# Patient Record
Sex: Female | Born: 1940 | Race: White | Hispanic: No | Smoking: Former smoker
Health system: Southern US, Community
[De-identification: ages and names within clinical notes are randomized; demographics above are authoritative.]

## PROBLEM LIST (undated history)

## (undated) DIAGNOSIS — I2510 Atherosclerotic heart disease of native coronary artery without angina pectoris: Secondary | ICD-10-CM

## (undated) DIAGNOSIS — I428 Other cardiomyopathies: Secondary | ICD-10-CM

## (undated) DIAGNOSIS — E785 Hyperlipidemia, unspecified: Secondary | ICD-10-CM

## (undated) DIAGNOSIS — I509 Heart failure, unspecified: Secondary | ICD-10-CM

## (undated) DIAGNOSIS — I5042 Chronic combined systolic (congestive) and diastolic (congestive) heart failure: Secondary | ICD-10-CM

## (undated) DIAGNOSIS — R0989 Other specified symptoms and signs involving the circulatory and respiratory systems: Secondary | ICD-10-CM

## (undated) DIAGNOSIS — R000 Tachycardia, unspecified: Secondary | ICD-10-CM

## (undated) DIAGNOSIS — C679 Malignant neoplasm of bladder, unspecified: Secondary | ICD-10-CM

## (undated) DIAGNOSIS — K859 Acute pancreatitis without necrosis or infection, unspecified: Secondary | ICD-10-CM

## (undated) DIAGNOSIS — M1120 Other chondrocalcinosis, unspecified site: Secondary | ICD-10-CM

## (undated) DIAGNOSIS — M810 Age-related osteoporosis without current pathological fracture: Secondary | ICD-10-CM

## (undated) DIAGNOSIS — N182 Chronic kidney disease, stage 2 (mild): Secondary | ICD-10-CM

## (undated) DIAGNOSIS — I272 Pulmonary hypertension, unspecified: Secondary | ICD-10-CM

## (undated) DIAGNOSIS — I341 Nonrheumatic mitral (valve) prolapse: Secondary | ICD-10-CM

## (undated) DIAGNOSIS — R011 Cardiac murmur, unspecified: Secondary | ICD-10-CM

## (undated) DIAGNOSIS — I739 Peripheral vascular disease, unspecified: Secondary | ICD-10-CM

## (undated) DIAGNOSIS — Z720 Tobacco use: Secondary | ICD-10-CM

## (undated) HISTORY — DX: Other specified symptoms and signs involving the circulatory and respiratory systems: R09.89

## (undated) HISTORY — DX: Tobacco use: Z72.0

## (undated) HISTORY — DX: Hyperlipidemia, unspecified: E78.5

## (undated) HISTORY — DX: Age-related osteoporosis without current pathological fracture: M81.0

## (undated) HISTORY — DX: Atherosclerotic heart disease of native coronary artery without angina pectoris: I25.10

## (undated) HISTORY — DX: Cardiac murmur, unspecified: R01.1

## (undated) HISTORY — DX: Tachycardia, unspecified: R00.0

## (undated) HISTORY — DX: Chronic combined systolic (congestive) and diastolic (congestive) heart failure: I50.42

## (undated) HISTORY — PX: APPENDECTOMY: SHX54

## (undated) HISTORY — DX: Malignant neoplasm of bladder, unspecified: C67.9

## (undated) HISTORY — DX: Heart failure, unspecified: I50.9

## (undated) HISTORY — DX: Nonrheumatic mitral (valve) prolapse: I34.1

## (undated) HISTORY — PX: OTHER SURGICAL HISTORY: SHX169

## (undated) HISTORY — DX: Other chondrocalcinosis, unspecified site: M11.20

## (undated) HISTORY — DX: Other cardiomyopathies: I42.8

## (undated) HISTORY — DX: Chronic kidney disease, stage 2 (mild): N18.2

## (undated) HISTORY — DX: Pulmonary hypertension, unspecified: I27.20

## (undated) HISTORY — DX: Acute pancreatitis without necrosis or infection, unspecified: K85.90

## (undated) HISTORY — DX: Peripheral vascular disease, unspecified: I73.9

---

## 2000-11-04 ENCOUNTER — Encounter: Payer: Self-pay | Admitting: Emergency Medicine

## 2000-11-04 ENCOUNTER — Emergency Department (HOSPITAL_COMMUNITY): Admission: EM | Admit: 2000-11-04 | Discharge: 2000-11-04 | Payer: Self-pay | Admitting: Emergency Medicine

## 2004-02-05 ENCOUNTER — Emergency Department (HOSPITAL_COMMUNITY): Admission: EM | Admit: 2004-02-05 | Discharge: 2004-02-05 | Payer: Self-pay | Admitting: Emergency Medicine

## 2004-02-06 ENCOUNTER — Emergency Department (HOSPITAL_COMMUNITY): Admission: EM | Admit: 2004-02-06 | Discharge: 2004-02-06 | Payer: Self-pay | Admitting: Emergency Medicine

## 2007-09-19 ENCOUNTER — Emergency Department (HOSPITAL_COMMUNITY): Admission: EM | Admit: 2007-09-19 | Discharge: 2007-09-19 | Payer: Self-pay | Admitting: Emergency Medicine

## 2007-09-26 HISTORY — PX: LAPAROSCOPIC CHOLECYSTECTOMY: SUR755

## 2007-12-02 ENCOUNTER — Ambulatory Visit (HOSPITAL_COMMUNITY): Admission: RE | Admit: 2007-12-02 | Discharge: 2007-12-03 | Payer: Self-pay | Admitting: General Surgery

## 2007-12-02 ENCOUNTER — Encounter (INDEPENDENT_AMBULATORY_CARE_PROVIDER_SITE_OTHER): Payer: Self-pay | Admitting: General Surgery

## 2008-08-24 ENCOUNTER — Ambulatory Visit (HOSPITAL_COMMUNITY): Admission: RE | Admit: 2008-08-24 | Discharge: 2008-08-24 | Payer: Self-pay | Admitting: Family

## 2009-04-21 ENCOUNTER — Ambulatory Visit: Payer: Self-pay | Admitting: Cardiology

## 2009-05-07 ENCOUNTER — Ambulatory Visit: Payer: Self-pay

## 2009-05-07 ENCOUNTER — Encounter: Payer: Self-pay | Admitting: Cardiology

## 2009-05-07 DIAGNOSIS — R0989 Other specified symptoms and signs involving the circulatory and respiratory systems: Secondary | ICD-10-CM | POA: Insufficient documentation

## 2009-05-17 ENCOUNTER — Telehealth: Payer: Self-pay | Admitting: Cardiology

## 2009-05-20 DIAGNOSIS — M79609 Pain in unspecified limb: Secondary | ICD-10-CM | POA: Insufficient documentation

## 2009-06-05 DIAGNOSIS — E1169 Type 2 diabetes mellitus with other specified complication: Secondary | ICD-10-CM | POA: Insufficient documentation

## 2009-06-05 DIAGNOSIS — C679 Malignant neoplasm of bladder, unspecified: Secondary | ICD-10-CM | POA: Insufficient documentation

## 2009-06-05 DIAGNOSIS — I059 Rheumatic mitral valve disease, unspecified: Secondary | ICD-10-CM | POA: Insufficient documentation

## 2009-06-05 DIAGNOSIS — Z8679 Personal history of other diseases of the circulatory system: Secondary | ICD-10-CM | POA: Insufficient documentation

## 2009-06-05 DIAGNOSIS — E119 Type 2 diabetes mellitus without complications: Secondary | ICD-10-CM | POA: Insufficient documentation

## 2009-06-05 DIAGNOSIS — E785 Hyperlipidemia, unspecified: Secondary | ICD-10-CM

## 2009-06-21 ENCOUNTER — Ambulatory Visit: Payer: Self-pay | Admitting: Cardiovascular Disease

## 2009-07-02 DIAGNOSIS — I739 Peripheral vascular disease, unspecified: Secondary | ICD-10-CM | POA: Insufficient documentation

## 2010-06-10 ENCOUNTER — Encounter: Payer: Self-pay | Admitting: Cardiovascular Disease

## 2010-06-13 ENCOUNTER — Ambulatory Visit: Payer: Self-pay

## 2010-06-13 ENCOUNTER — Encounter: Payer: Self-pay | Admitting: Cardiovascular Disease

## 2010-06-23 ENCOUNTER — Ambulatory Visit: Payer: Self-pay | Admitting: Cardiovascular Disease

## 2010-10-25 NOTE — Miscellaneous (Signed)
Summary: Orders Update  Clinical Lists Changes  Orders: Added new Test order of Arterial Duplex Lower Extremity (Arterial Duplex Low) - Signed 

## 2010-10-25 NOTE — Miscellaneous (Signed)
Summary: Orders Update  Clinical Lists Changes  Problems: Added new problem of ABDOMINAL BRUIT (ICD-785.9) Orders: Added new Test order of Abdominal Aorta Duplex (Abd Aorta Duplex) - Signed 

## 2010-10-25 NOTE — Assessment & Plan Note (Signed)
Summary: Hannah French    Visit Type:  1 year follow up Primary Provider:  Vernon Prey, M.D.  CC:  follow up ABI.  History of Present Illness: 70 year-old woman presents for followup evaluation of lower extremity PAD. She has been diagnosed with iliac stenosis based on duplex ultrasound findings. She is limited by bilateral knee pain and carries a diagnosis of pseudogout. She is able to walk on grass and soft surfaces but has knee pain when walking on concrete or hard surfaces. She denies rest pain or ulceration.  ABI's and arterial duplex scan done on 06/07/10 shows moderate right iliac stenosis, severe left iliac stenosis, and normal ABI's of 1.1 bilaterally.  Current Medications (verified): 1)  Vitamin D 1000 Unit  Tabs (Cholecalciferol) .... Take 1 Tablet By Mouth Once A Day 2)  Ibuprofen 200 Mg Tabs (Ibuprofen) .... As Needed 3)  Aspirin 81 Mg Tbec (Aspirin) .... Take One Tablet By Mouth Daily  Allergies: 1)  ! Metformin Hcl 2)  ! * Bextra 3)  ! Vioxx  Past History:  Past medical history reviewed for relevance to current acute and chronic problems.  Past Medical History: Current Problems:  HEART MURMUR, HX OF (ICD-V12.50) MITRAL VALVE PROLAPSE (ICD-424.0) DYSLIPIDEMIA (ICD-272.4) BLADDER CANCER (ICD-188.9) PSEUDOGOUT (ICD-275.49) DIABETES MELLITUS, TYPE II (ICD-250.00) PVD (443.9) ABDOMINAL BRUIT (ICD-785.9) CAROTID BRUIT (ICD-785.9)  Review of Systems       Negative except as per HPI   Vital Signs:  Patient profile:   70 year old female Height:      61 inches Weight:      132.50 pounds BMI:     25.13 Pulse rate:   80 / minute Pulse rhythm:   regular Resp:     18 per minute BP sitting:   138 / 76  (left arm) Cuff size:   large  Vitals Entered By: Vikki Ports (June 23, 2010 10:32 AM)  Serial Vital Signs/Assessments:  Time      Position  BP       Pulse  Resp  Temp     By           R Arm     140/70                         Vikki Ports   Physical  Exam  General:  Pt is well-developed, alert and oriented, no acute distress HEENT: normal Neck: no thyromegaly           JVP normal, carotid upstrokes normal with bilateral bruits Lungs: CTA Chest: equal expansion  CV: Apical impulse nondisplaced, RRR with grade 2/6 systolic murmur at the right upper sternal border Abd: soft, NT, positive BS, no HSM, no bruit Back: no CVA tenderness Ext: no clubbing, cyanosis, or edema        femoral pulses 2+ and equal        pedal pulses 2+ and equal Skin: warm, dry, no rash     Impression & Recommendations:  Problem # 1:  PVD (ICD-443.9) The patient has bilateral common iliac artery stenosis. She is asymptomatic and has normal ABI's. We discussed indications for angio and interention, but as she is asymptomatic I think she should be managed medically. She should continue ASA daily. Lipids are followed by Dr Christell Constant. In the setting of iliac and carotid stenosis, her goal LDL is less than 100 mg/dL. We had a long discussion about the importance of tobacco cessation and the negative impact of  cigarettes on PAD outcomes.  Problem # 2:  CAROTID BRUIT (ICD-785.9) Studies reviewed from last year. 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 Will followup at the time of her lower extremity scan next year.  Patient Instructions: 1)  Your physician recommends that you continue on your current medications as directed. Please refer to the Current Medication list given to you today. 2)  Your physician wants you to follow-up in: 1 YEAR.   You will receive a reminder letter in the mail two months in advance. If you don't receive a letter, please call our office to schedule the follow-up appointment. 3)  Your physician has requested that you have an abdominal aorta-iliac duplex in 1 YEAR. During this test, an ultrasound is used to evaluate the aorta. Allow 30 minutes for this exam. Do not eat after midnight the day before and avoid carbonated beverages. There are no  restrictions or special instructions. 4)  Your physician has requested that you have an ankle brachial index (ABI) in 1 YEAR. During this test an ultrasound and blood pressure cuff are used to evaluate the arteries that supply the arms and legs with blood. Allow thirty minutes for this exam. There are no restrictions or special instructions. 5)  Your physician has requested that you have a carotid duplex in 1 YEAR. This test is an ultrasound of the carotid arteries in your neck. It looks at blood flow through these arteries that supply the brain with blood. Allow one hour for this exam. There are no restrictions or special instructions.

## 2011-02-07 NOTE — Assessment & Plan Note (Signed)
[facility]                            CARDIOLOGY OFFICE NOTE   VADIS, SLABACH                        MRN:          [id number]  DATE:04/21/2009                            DOB:          1940-11-28    PRIMARY CARE PHYSICIAN:  Lindaann Pascal, PA, [facility].   REASON FOR PRESENTATION:  Evaluate the patient with heart murmur.   HISTORY OF PRESENT ILLNESS:  The patient is a pleasant 70 year old who  was followed some years ago in [place] for a heart murmur.  She said she  had an echocardiogram and describes Holter monitors at that time.  She  does not report a catheterization.  There has never been any necessary  treatment for this and she is not sure of a diagnosis.  She is relocated  to this area.  She has not seen a cardiologist in many years.  She has  been referred here for further evaluation.   Of note, the patient is limited by joint pains.  She has pseudogout.  However, with some activities that includes walking, she cannot bring on  chest pressure, neck or arm discomfort.  She does not report any  shortness of breath, PND or orthopnea.  She does not notice any  palpitations, presyncope or syncope.  I reviewed her side records.  Her  hemoglobin A1c was 8.3.  Her LDL was 131, triglycerides 138 with an HDL  of 46.   PAST MEDICAL HISTORY:  Diabetes mellitus x12 years, dyslipidemia,  pseudogout, heart murmur, and bladder cancer.   PAST SURGICAL HISTORY:  Cholecystectomy, hysterectomy, appendectomy.   ALLERGIES/INTOLERANCES:  METFORMIN.   MEDICATIONS:  1. Glimepiride 4 mg q.a.m.  2. Furosemide 20 mg daily.  3. Vitamin D.  4. Omeprazole.  5. Actos 15 mg daily.  6. Ibuprofen.   SOCIAL HISTORY:  The patient is retired.  She is single.  She has 2  children.  She has been smoking 1 pack per day for 30 years.   FAMILY HISTORY:  Noncontributory for early coronary artery disease.  Her  mother died of heart failure at 70.   Her father died at 30 of an unknown  cause.   REVIEW OF SYSTEMS:  As stated in the HPI, positive for reflux.  Negative  for all other systems.   PHYSICAL EXAMINATION:  GENERAL:  The patient is pleasant and in no  distress.  VITAL SIGNS:  Blood pressure 160/90, heart rate 87 and regular, weight  157 pounds, body mass index 28.  HEENT:  Eyes are unremarkable, pupils are equal, round and reactive to  light, fundi not visualized, oral mucosa unremarkable.  NECK:  No jugular venous distention at 45 degrees.  Carotid upstroke  brisk and symmetric, soft left greater than right, systolic carotid  bruit, no thyromegaly.  LYMPHATICS:  No cervical, axillary, or inguinal adenopathy.  LUNGS:  Decreased breath sounds with a scattered expiratory wheezes, but  no crackles, no dullness to percussion.  BACK:  No costovertebral angle tenderness.  CHEST:  Unremarkable.  HEART:  PMI not displaced or sustained, S1 and S2  within normal limits,  no S3, no S4, 2/6 systolic murmur heard best at the left upper sternal  border, no diastolic murmurs.  ABDOMEN:  Flat, positive bowel sounds.  Normal in frequency and pitch,  positive midline bruit, no rebound, no guarding, no midline pulsatile  mass, no hepatomegaly, no splenomegaly.  SKIN:  No rashes, no nodules.  EXTREMITIES:  Pulses are 2+ throughout, no edema, no cyanosis, no  clubbing.  NEURO:  Oriented to person, place and time, cranial nerves II-XII  grossly intact, motor grossly intact.   EKG; sinus rhythm, rate 87, axis within normal limits, intervals within  normal limits, poor anterior R-wave progression, could not exclude old  anteroseptal infarct, nonspecific T-wave flattening.   ASSESSMENT AND PLAN:  1. Heart murmur.  The patient does have a heart murmur, which I      suspect is mild aortic stenosis.  I will get an echocardiogram to      further evaluate this.  This will also allow me to look for wall      motion abnormalities given the  abnormal EKG.  Further evaluation      will be based on these results.  2. Carotid bruit.  The patient does have this and I do not think this      is a transmitted murmur.  I will get carotid Dopplers.  3. Abdominal bruits.  She clearly has this.  I do note feel a midline      pulsatile mass.  However, she needs to be screened for an abdominal      aortic aneurysm and she will have an ultrasound.  4. Hypertension.  Blood pressure is elevated today, but it has not      been elevated at recent appointments.  I think she should be on an      ACE inhibitor or ARB.  However, she is being established with [facility]      long and I will allow him to address this.  5. Dyslipidemia.  She is going to be started on a statin and has a      followup to do this.  I would suggest a goal LDL at least less than      100 and HDL greater than 50 given her evidence of peripheral      vascular disease and ongoing risk factors.  6. Tobacco.  I spent some time discussing the need to stop smoking      (greater than 3 minutes).  She does have some situational      depression and anxiety dealing with her daughter, who had cancer.      I do not think Chantix is a reasonable choice.  However, Wellbutrin      might help.  We discussed the possibility of worsening depression,      suicidal ideation, other black box warnings.  She would be given a      prescription for this and instructions on how to stop      smoking with this.  7. Followup.  I would like to see her back in 6 months or sooner if      needed.      Rollene Rotunda, MD, [facility]  Electronically Signed    JH/MedQ  DD: 04/21/2009  DT: 04/22/2009  Job #: [id number]   cc:   Lindaann Pascal, PA

## 2011-02-07 NOTE — Op Note (Signed)
NAME:  Hannah French, Hannah French               ACCOUNT NO.:  [id number]   MEDICAL RECORD NO.:  [id number]          PATIENT TYPE:  OIB   LOCATION:  5153                         FACILITY:  [facility]   PHYSICIAN:  Cherylynn Ridges, M.D.    DATE OF BIRTH:  [birth year]   DATE OF PROCEDURE:  12/02/2007  DATE OF DISCHARGE:                               OPERATIVE REPORT   PREOPERATIVE DIAGNOSIS:  Symptomatic biliary dyskinesia.   POSTOPERATIVE DIAGNOSIS:  Symptomatic biliary dyskinesia.   PROCEDURE:  Laparoscopic cholecystectomy with cholangiogram.   SURGEON:  Cherylynn Ridges, M.D.   ASSISTANT:  Dr. Janee Morn.   ANESTHESIA:  General endotracheal.   ESTIMATED BLOOD LOSS:  Less than 20 mL.   COMPLICATIONS:  None.   CONDITION:  Stable.   SPECIMEN:  Gallbladder with no palpable stones.   INDICATIONS FOR OPERATION:  The patient is a 70 year old female who has  an ejection fraction of less than 20% who comes in now for an elective  laparoscopic cholecystectomy.   FINDINGS:  The patient had adhesions to the gallbladder with a normal  intraoperative cholangiogram.  She also had some right lower quadrant  adhesions which did not appeared to be causing any significant  pathophysiology.   OPERATION:  The patient was taken to the operating room, placed on table  in supine position.  After an adequate general endotracheal anesthetic  was administered, she was prepped and draped in usual sterile manner  exposing the midline and right upper quadrant.   A supraumbilical curvilinear incision was made using a #11 blade taken  down to the midline fascia.  We incised longitudinally in the midline  fascia using #15 blade and grabbed the edges with Kocher clamp, tented  up once we bluntly dissected down into the peritoneal cavity with a  Kelly clamp.  Once we were in the peritoneal cavity, a pursestring  suture of zero Vicryl was passed around the fascial opening to secure in  the [place] cannula which was subsequently  passed into the peritoneal  cavity.  Once the [facility] cannula was secured in place, carbon dioxide  gas was insufflated through the cannula into the peritoneal cavity up to  maximal intra-abdominal pressure of 50 mmHg.   Once the [facility] cannula was in place, two right costal margin 5-mm  cannulas and a subxiphoid 12-mm cannula passed under direct vision into  the peritoneal cavity.  The patient was placed in reverse Trendelenburg,  the left-side was tilted down and the dissection begun.   There were some omental adhesions in the duodenum somewhat tented up by  the body and infundibulum of the gallbladder.  These were dissected away  with cautery and blunt dissection.  We were then able to dissect out the  peritoneum overlying the triangle of Calot and hepatoduodenal triangle  identifying both the cystic duct and cystic artery.  We clipped along  the gallbladder side of the cystic duct and then made a  cholecystodochotomy using laparoscopic scissors.  Once this was done, a  cholecystodochotomy was made using the scissors and a Cook catheter was  passed into the cholecystodochotomy which  was passed through the  anterior abdominal wall.  A cholangiogram was performed which showed  good flow into the duodenum, no intraductal filling defects, no  dilatation of the cystic duct, no dilatation of common bile duct and a  very long cystic duct.   Once cholangiogram was completed, we removed the catheter, removed the  clip, secured it in place, then clipped the cystic duct distally x3 and  then transected it.  We dissected out the gallbladder, the cystic  artery, clipped it proximally and distally and then transected it.  We  then dissect out the gallbladder from its bed with minimal difficulty  removing it from the supraumbilical site using a large grasper.   There was minimal bleeding from the bed, no irrigation was used.  We  cauterized slightly on the gallbladder bed then we aspirated all  gas  from above the liver as we removed all cannulas.   We injected 25% Marcaine at all sites. Then we closed the skin at the  subxiphoid and supraumbilical site using running subcuticular stitch of  4-0 Vicryl.  Dermabond was used to cover the supraumbilical and  subxiphoid sites and to close the lateral cannula sites.  Steri-Strips  and Tegaderm were applied to the wound.  All needle, sponge counts and  instrument counts were correct.      Cherylynn Ridges, M.D.  Electronically Signed     JOW/MEDQ  D:  12/02/2007  T:  12/03/2007  Job:  [id number]   cc:   Evelena Peat, M.D.

## 2011-03-16 ENCOUNTER — Telehealth: Payer: Self-pay | Admitting: Cardiovascular Disease

## 2011-03-16 DIAGNOSIS — I6529 Occlusion and stenosis of unspecified carotid artery: Secondary | ICD-10-CM

## 2011-03-16 DIAGNOSIS — I739 Peripheral vascular disease, unspecified: Secondary | ICD-10-CM

## 2011-03-16 DIAGNOSIS — I700 Atherosclerosis of aorta: Secondary | ICD-10-CM

## 2011-03-16 NOTE — Telephone Encounter (Signed)
Spoke with pt and she is aware of tests needed. Pt transferred to scheduler to arrange.

## 2011-03-16 NOTE — Telephone Encounter (Signed)
Tests scheduled for June 07, 2011

## 2011-03-16 NOTE — Telephone Encounter (Signed)
Pt need to be set up for doppler test prior to appt in sept .

## 2011-03-16 NOTE — Telephone Encounter (Signed)
Upon review of chart pt needs Carotid duplex, Aorta-iliac duplex and ABI's prior to appt with Dr. Excell Seltzer in September.

## 2011-05-02 ENCOUNTER — Encounter: Payer: Self-pay | Admitting: Cardiology

## 2011-06-07 ENCOUNTER — Encounter (INDEPENDENT_AMBULATORY_CARE_PROVIDER_SITE_OTHER): Payer: Medicare Other | Admitting: Cardiology

## 2011-06-07 DIAGNOSIS — I739 Peripheral vascular disease, unspecified: Secondary | ICD-10-CM

## 2011-06-07 DIAGNOSIS — I700 Atherosclerosis of aorta: Secondary | ICD-10-CM

## 2011-06-07 DIAGNOSIS — I6529 Occlusion and stenosis of unspecified carotid artery: Secondary | ICD-10-CM

## 2011-06-16 ENCOUNTER — Ambulatory Visit (INDEPENDENT_AMBULATORY_CARE_PROVIDER_SITE_OTHER): Payer: Medicare Other | Admitting: Cardiovascular Disease

## 2011-06-16 ENCOUNTER — Encounter: Payer: Self-pay | Admitting: Cardiovascular Disease

## 2011-06-16 VITALS — BP 145/68 | HR 67 | Resp 18 | Ht 62.0 in | Wt 132.8 lb

## 2011-06-16 DIAGNOSIS — I739 Peripheral vascular disease, unspecified: Secondary | ICD-10-CM

## 2011-06-16 NOTE — Patient Instructions (Signed)
Your physician wants you to follow-up in: 1 YEAR.  You will receive a reminder letter in the mail two months in advance. If you don't receive a letter, please call our office to schedule the follow-up appointment.  Your physician has requested that you have an abdominal aorta duplex in 1 YEAR. During this test, an ultrasound is used to evaluate the aorta. Allow 30 minutes for this exam. Do not eat after midnight the day before and avoid carbonated beverages  Your physician has requested that you have an ankle brachial index (ABI) in 1 YEAR. During this test an ultrasound and blood pressure cuff are used to evaluate the arteries that supply the arms and legs with blood. Allow thirty minutes for this exam. There are no restrictions or special instructions.

## 2011-06-16 NOTE — Progress Notes (Signed)
HPI:  This is a 70 year old woman presenting for followup evaluation. The patient is followed for severe but asymptomatic iliac stenosis. She has undergone serial duplex scans. Her most recent scan was June 07, 2011 demonstrating a peak velocity of 367 cm/s over the right common iliac and 596 cm/s over the left common iliac. Her ABIs are in the normal range at 97% on the right and 93% on the left.  She is limited by bilateral knee pain. She is able to remain active and does regular yard work and activities around the house without pain in her thigh or calf muscles. Other than her knees, she denies any leg pain with walking or other activities. She denies rest pain or ulcerations. She is having no chest pain, shortness of breath, palpitations, or other complaints.  Outpatient Encounter Prescriptions as of 06/16/2011  Medication Sig Dispense Refill  . aspirin 81 MG EC tablet Take 81 mg by mouth daily.        . Cholecalciferol 1000 UNITS tablet Take 1,000 Units by mouth daily.        Marland Kitchen ibuprofen (ADVIL,MOTRIN) 200 MG tablet Take 200 mg by mouth as needed.          Allergies  Allergen Reactions  . Iohexol   . Metformin   . Rofecoxib     Past Medical History  Diagnosis Date  . Heart murmur   . Mitral valve prolapse   . Dyslipidemia   . Bladder cancer   . Pseudogout   . Diabetes mellitus     Type II  . PVD (peripheral vascular disease)   . Abdominal bruit   . Carotid bruit     ROS: Negative except as per HPI  BP 145/68  Pulse 67  Resp 18  Ht 5\' 2"  (1.575 m)  Wt 132 lb 12.8 oz (60.238 kg)  BMI 24.29 kg/m2  PHYSICAL EXAM: Pt is alert and oriented, Pleasant woman in NAD HEENT: normal Neck: JVP - normal, carotids 2+= with bilateral bruits Lungs: CTA bilaterally CV: RRR with 2/6 harsh early peaking systolic murmur at the right upper sternal border with preserved aortic closure sound Abd: soft, NT, Positive BS, no hepatomegaly Ext: no C/C/E, posterior tibial pulses 2+ and  equal, DP pulses 1+ and equal. Femoral pulses 2+ with bilateral bruits Skin: warm/dry no rash  ASSESSMENT AND PLAN:

## 2011-06-16 NOTE — Assessment & Plan Note (Signed)
The patient remains asymptomatic. She has severely elevated velocities over the left common iliac artery her ABI remains in normal range. Based on her lack of symptoms and normal ABI, I would continue with conservative therapy. I have discussed the importance of tobacco cessation with the patient again this year and she understands. She remains on antiplatelet therapy with aspirin. Her systolic blood pressure is a little elevated but she states this is almost always in the normal range. Her cholesterol is followed by Dr. Christell Constant. I will see her back in one year with a followup aortic duplex and ABIs.

## 2011-06-19 LAB — COMPREHENSIVE METABOLIC PANEL
ALT: 13
AST: 17
Albumin: 3.5
Alkaline Phosphatase: 68
BUN: 15
CO2: 30
Calcium: 9.4
Chloride: 100
Creatinine, Ser: 0.67
GFR calc Af Amer: >60
GFR calc non Af Amer: >60
Glucose, Bld: 287 — ABNORMAL HIGH
Potassium: 4.5
Sodium: 138
Total Bilirubin: 0.5
Total Protein: 6.6

## 2011-06-19 LAB — CBC
HCT: 42.7
Hemoglobin: 14.3
MCHC: 33.6
MCV: 93.2
Platelets: 296
RBC: 4.58
RDW: 12.8
WBC: 12.4 — ABNORMAL HIGH

## 2011-06-19 LAB — DIFFERENTIAL
Basophils Absolute: 0
Basophils Relative: 0
Eosinophils Absolute: 0.2
Eosinophils Relative: 1
Lymphocytes Relative: 24
Lymphs Abs: 2.9
Monocytes Absolute: 0.7
Monocytes Relative: 6
Neutro Abs: 8.6 — ABNORMAL HIGH
Neutrophils Relative %: 69

## 2011-06-30 LAB — COMPREHENSIVE METABOLIC PANEL
ALT: 17
AST: 19
Albumin: 3.9
Alkaline Phosphatase: 63
BUN: 13
CO2: 27
Calcium: 9.3
Chloride: 104
Creatinine, Ser: 0.52
GFR calc Af Amer: >60
GFR calc non Af Amer: >60
Glucose, Bld: 103 — ABNORMAL HIGH
Potassium: 3.9
Sodium: 139
Total Bilirubin: 0.6
Total Protein: 7.4

## 2011-06-30 LAB — DIFFERENTIAL
Basophils Absolute: 0.1
Basophils Relative: 1
Eosinophils Absolute: 0.2
Eosinophils Relative: 2
Lymphocytes Relative: 26
Lymphs Abs: 2.9
Monocytes Absolute: 0.6
Monocytes Relative: 5
Neutro Abs: 7.5
Neutrophils Relative %: 67

## 2011-06-30 LAB — LIPASE, BLOOD: Lipase: 152 — ABNORMAL HIGH

## 2011-06-30 LAB — CBC
HCT: 42.7
Hemoglobin: 14.1
MCHC: 33
MCV: 93.9
Platelets: 255
RBC: 4.55
RDW: 13.7
WBC: 11.3 — ABNORMAL HIGH

## 2012-03-04 ENCOUNTER — Telehealth: Payer: Self-pay | Admitting: Cardiovascular Disease

## 2012-03-04 DIAGNOSIS — I700 Atherosclerosis of aorta: Secondary | ICD-10-CM

## 2012-03-04 DIAGNOSIS — I739 Peripheral vascular disease, unspecified: Secondary | ICD-10-CM

## 2012-03-04 NOTE — Telephone Encounter (Signed)
New problem:  Patient has appt in Sept to see Dr. Excell Seltzer would like dopplers test schedule prior to appt.

## 2012-03-04 NOTE — Telephone Encounter (Signed)
Order placed in computer to be done in Sept per note from Dr Excell Seltzer on 9/12.  Pt was notified.

## 2012-05-28 ENCOUNTER — Ambulatory Visit: Payer: Medicare Other | Admitting: Cardiovascular Disease

## 2012-06-11 ENCOUNTER — Ambulatory Visit: Payer: Medicare Other | Admitting: Cardiovascular Disease

## 2012-06-12 ENCOUNTER — Encounter (INDEPENDENT_AMBULATORY_CARE_PROVIDER_SITE_OTHER): Payer: Medicare Other

## 2012-06-12 DIAGNOSIS — I700 Atherosclerosis of aorta: Secondary | ICD-10-CM

## 2012-06-12 DIAGNOSIS — I739 Peripheral vascular disease, unspecified: Secondary | ICD-10-CM

## 2012-06-28 ENCOUNTER — Encounter (INDEPENDENT_AMBULATORY_CARE_PROVIDER_SITE_OTHER): Payer: Medicare Other

## 2012-06-28 DIAGNOSIS — I700 Atherosclerosis of aorta: Secondary | ICD-10-CM

## 2012-07-18 ENCOUNTER — Institutional Professional Consult (permissible substitution): Payer: Medicare Other | Admitting: Cardiovascular Disease

## 2012-07-22 ENCOUNTER — Encounter: Payer: Self-pay | Admitting: Cardiovascular Disease

## 2012-07-22 ENCOUNTER — Ambulatory Visit (INDEPENDENT_AMBULATORY_CARE_PROVIDER_SITE_OTHER): Payer: Medicare Other | Admitting: Cardiovascular Disease

## 2012-07-22 VITALS — BP 134/74 | HR 90 | Ht 61.5 in | Wt 130.0 lb

## 2012-07-22 DIAGNOSIS — I739 Peripheral vascular disease, unspecified: Secondary | ICD-10-CM

## 2012-07-22 DIAGNOSIS — I6529 Occlusion and stenosis of unspecified carotid artery: Secondary | ICD-10-CM | POA: Insufficient documentation

## 2012-07-22 NOTE — Progress Notes (Signed)
   HPI:  71 year old woman presenting for followup of asymptomatic carotid disease and lower extremity peripheral arterial disease. She has no history of hypertension, hyperlipidemia, or diabetes. Unfortunately she continues to smoke cigarettes. Recent noninvasive studies demonstrated severe stenosis of both common iliac arteries, with no significant change from previous studies. Her ABIs remained within normal limits at 1.0 on the right and 1.1 on the left. A carotid duplex scan one year ago demonstrated stable 40-59% bilateral ICA stenosis and 2 year followup is recommended.  The patient reports no clinical symptoms. She specifically denies symptoms of exertional calf or thigh pain. She denies chest pain or dyspnea. She's had no stroke or TIA symptoms.  Outpatient Encounter Prescriptions as of 07/22/2012  Medication Sig Dispense Refill  . aspirin 81 MG EC tablet Take 81 mg by mouth daily.        Marland Kitchen ibuprofen (ADVIL,MOTRIN) 200 MG tablet Take 200 mg by mouth as needed.        Marland Kitchen DISCONTD: Cholecalciferol 1000 UNITS tablet Take 1,000 Units by mouth daily.          Allergies  Allergen Reactions  . Iohexol   . Metformin   . Rofecoxib     Past Medical History  Diagnosis Date  . Heart murmur   . Mitral valve prolapse   . Dyslipidemia   . Bladder cancer   . Pseudogout   . Diabetes mellitus     Type II  . PVD (peripheral vascular disease)   . Abdominal bruit   . Carotid bruit     ROS: Negative except as per HPI  BP 134/74  Pulse 90  Ht 5' 1.5" (1.562 m)  Wt 58.968 kg (130 lb)  BMI 24.17 kg/m2  PHYSICAL EXAM: Pt is alert and oriented, NAD HEENT: normal Neck: JVP - normal, carotids 2+= without bruits Lungs: CTA bilaterally CV: RRR without murmur or gallop Abd: soft, NT, Positive BS, no hepatomegaly Ext: no C/C/E, distal pulses intact and equal. Femoral pulses are 2+ and equal. Skin: warm/dry no rash  EKG:  Normal sinus rhythm 90 beats per minute, cannot exclude  age-indeterminate septal infarction, nonspecific T wave abnormality.  ASSESSMENT AND PLAN: 1. Carotid stenosis without history of stroke. Continue aspirin for antiplatelet therapy. The importance of tobacco cessation was reviewed. She should have a followup carotid duplex scan in 1 year.  2. Lower extremity peripheral arterial disease. The patient has no symptoms of leg ischemia. She has stable iliac disease and continued observation is appropriate. She will have followup ABIs and aortoiliac duplex scanning in 12 months.  Tonny Bollman 07/22/2012 10:16 PM

## 2012-07-22 NOTE — Patient Instructions (Addendum)
You are due for a repeat aorto-iliac duplex and ABI in 1 YEAR.  Your physician has requested that you have a carotid duplex in 1 YEAR. This test is an ultrasound of the carotid arteries in your neck. It looks at blood flow through these arteries that supply the brain with blood. Allow one hour for this exam. There are no restrictions or special instructions.  Your physician wants you to follow-up in: 1 YEAR. You will receive a reminder letter in the mail two months in advance. If you don't receive a letter, please call our office to schedule the follow-up appointment.  Your physician recommends that you continue on your current medications as directed. Please refer to the Current Medication list given to you today.

## 2012-07-23 NOTE — Addendum Note (Signed)
Addended by: Iona Coach on: 07/23/2012 09:34 AM   Modules accepted: Orders

## 2013-02-05 ENCOUNTER — Telehealth: Payer: Self-pay | Admitting: Physician Assistant

## 2013-02-05 DIAGNOSIS — E785 Hyperlipidemia, unspecified: Secondary | ICD-10-CM

## 2013-02-05 DIAGNOSIS — E119 Type 2 diabetes mellitus without complications: Secondary | ICD-10-CM

## 2013-02-06 ENCOUNTER — Other Ambulatory Visit (INDEPENDENT_AMBULATORY_CARE_PROVIDER_SITE_OTHER): Payer: Medicare Other

## 2013-02-06 DIAGNOSIS — E119 Type 2 diabetes mellitus without complications: Secondary | ICD-10-CM

## 2013-02-06 DIAGNOSIS — E785 Hyperlipidemia, unspecified: Secondary | ICD-10-CM

## 2013-02-06 LAB — HEPATIC FUNCTION PANEL
ALT: 10 U/L (ref 0–35)
AST: 16 U/L (ref 0–37)
Albumin: 4.2 g/dL (ref 3.5–5.2)
Alkaline Phosphatase: 53 U/L (ref 39–117)
Bilirubin, Direct: 0.1 mg/dL (ref 0.0–0.3)
Indirect Bilirubin: 0.5 mg/dL (ref 0.0–0.9)
Total Bilirubin: 0.6 mg/dL (ref 0.3–1.2)
Total Protein: 6.7 g/dL (ref 6.0–8.3)

## 2013-02-06 LAB — LIPID PANEL
Cholesterol: 187 mg/dL (ref 0–200)
HDL: 53 mg/dL (ref >39)
LDL Cholesterol: 114 mg/dL — ABNORMAL HIGH (ref 0–99)
Total CHOL/HDL Ratio: 3.5 Ratio
Triglycerides: 102 mg/dL (ref <150)
VLDL: 20 mg/dL (ref 0–40)

## 2013-02-06 LAB — BASIC METABOLIC PANEL WITH GFR
BUN: 17 mg/dL (ref 6–23)
CO2: 27 mEq/L (ref 19–32)
Calcium: 9.7 mg/dL (ref 8.4–10.5)
Chloride: 105 mEq/L (ref 96–112)
Creat: 0.67 mg/dL (ref 0.50–1.10)
GFR, Est African American: >89 mL/min
GFR, Est Non African American: 88 mL/min
Glucose, Bld: 143 mg/dL — ABNORMAL HIGH (ref 70–99)
Potassium: 5.2 mEq/L (ref 3.5–5.3)
Sodium: 141 mEq/L (ref 135–145)

## 2013-02-06 LAB — POCT GLYCOSYLATED HEMOGLOBIN (HGB A1C): Hemoglobin A1C: 7.1

## 2013-02-06 NOTE — Progress Notes (Signed)
Patient came in  For labs only

## 2013-02-06 NOTE — Telephone Encounter (Signed)
Labs ordered and patient notified.  She will come in for labs this morning.

## 2013-02-13 NOTE — Progress Notes (Signed)
Patient has an appointment the 02/19/13

## 2013-02-19 ENCOUNTER — Encounter: Payer: Self-pay | Admitting: Physician Assistant

## 2013-02-19 ENCOUNTER — Ambulatory Visit (INDEPENDENT_AMBULATORY_CARE_PROVIDER_SITE_OTHER): Payer: Medicare Other | Admitting: Physician Assistant

## 2013-02-19 VITALS — BP 111/54 | HR 90 | Temp 98.7°F | Ht 61.5 in | Wt 125.0 lb

## 2013-02-19 DIAGNOSIS — E119 Type 2 diabetes mellitus without complications: Secondary | ICD-10-CM

## 2013-02-19 DIAGNOSIS — J069 Acute upper respiratory infection, unspecified: Secondary | ICD-10-CM

## 2013-02-19 MED ORDER — AZITHROMYCIN 250 MG PO TABS: ORAL_TABLET | ORAL | Status: DC

## 2013-02-19 NOTE — Patient Instructions (Signed)

## 2013-02-19 NOTE — Progress Notes (Signed)
Subjective:     Patient ID: Hannah French, female   DOB: 02-04-1941, 72 y.o.   MRN: [id number]  Cough This is a new problem. The current episode started in the past 7 days. The problem has been gradually worsening. The problem occurs hourly. The cough is productive of purulent sputum. Associated symptoms include chills, a fever, headaches, nasal congestion, postnasal drip, sweats and wheezing. Nothing aggravates the symptoms. Risk factors for lung disease include smoking/tobacco exposure. She has tried nothing for the symptoms. The treatment provided no relief. Her past medical history is significant for bronchitis.  Diabetes She presents for her follow-up diabetic visit. She has type 2 diabetes mellitus. No MedicAlert identification noted. Her disease course has been stable. Hypoglycemia symptoms include headaches and sweats. There are no diabetic associated symptoms. Symptoms are stable. There are no diabetic complications. Risk factors for coronary artery disease include diabetes mellitus, sedentary lifestyle and tobacco exposure. Current diabetic treatment includes oral agent (monotherapy). She is compliant with treatment all of the time. Her weight is stable. She is following a diabetic diet. Meal planning includes avoidance of concentrated sweets. She has not had a previous visit with a dietician. She participates in exercise intermittently. There is no change in her home blood glucose trend. Her overall blood glucose range is 110-130 mg/dl. An ACE inhibitor/angiotensin II receptor blocker is contraindicated. She does not see a podiatrist.Eye exam is not current.  Pt has done well with her NIDDM She has controlled so far with diet and exercise Pt takes her Amaryl when she is given steroid therapy for her Pseudogout Lasix is also on prn basis Given low BP unable to take BP meds   Review of Systems  Constitutional: Positive for fever and chills.  HENT: Positive for postnasal drip.   Respiratory:  Positive for cough and wheezing.   Neurological: Positive for headaches.  All other systems reviewed and are negative.       Objective:   Physical Exam  Nursing note and vitals reviewed. Neck: Neck supple. No JVD present. No tracheal deviation present. No thyromegaly present.  Cardiovascular: Normal rate and regular rhythm.   Murmur heard. Pulmonary/Chest: Effort normal. She has wheezes.  Musculoskeletal:  No lower ext edema/ulcerations  Lymphadenopathy:    She has no cervical adenopathy.       Assessment:     1. Diabetes   2. Acute upper respiratory infections of unspecified site        Plan:     Pt needs eye exam Foot care reviewed Cont with current course Zpack for URI Advair sample given Smoking cessation F/U in 6 months

## 2013-07-08 ENCOUNTER — Other Ambulatory Visit (INDEPENDENT_AMBULATORY_CARE_PROVIDER_SITE_OTHER): Payer: Medicare Other

## 2013-07-08 ENCOUNTER — Encounter (INDEPENDENT_AMBULATORY_CARE_PROVIDER_SITE_OTHER): Payer: Self-pay

## 2013-07-08 DIAGNOSIS — I10 Essential (primary) hypertension: Secondary | ICD-10-CM

## 2013-07-08 DIAGNOSIS — E559 Vitamin D deficiency, unspecified: Secondary | ICD-10-CM

## 2013-07-08 DIAGNOSIS — E1059 Type 1 diabetes mellitus with other circulatory complications: Secondary | ICD-10-CM

## 2013-07-08 LAB — POCT GLYCOSYLATED HEMOGLOBIN (HGB A1C): Hemoglobin A1C: 7.2

## 2013-07-08 NOTE — Progress Notes (Signed)
Patient here today for labs only. °

## 2013-07-09 ENCOUNTER — Other Ambulatory Visit: Payer: Self-pay | Admitting: Family Medicine

## 2013-07-09 DIAGNOSIS — E785 Hyperlipidemia, unspecified: Secondary | ICD-10-CM

## 2013-07-09 DIAGNOSIS — E559 Vitamin D deficiency, unspecified: Secondary | ICD-10-CM

## 2013-07-09 LAB — CMP14+EGFR
ALT: 12 IU/L (ref 0–32)
AST: 14 IU/L (ref 0–40)
Albumin/Globulin Ratio: 1.9 (ref 1.1–2.5)
Albumin: 4.3 g/dL (ref 3.5–4.8)
Alkaline Phosphatase: 70 IU/L (ref 39–117)
BUN/Creatinine Ratio: 17 (ref 11–26)
BUN: 13 mg/dL (ref 8–27)
CO2: 27 mmol/L (ref 18–29)
Calcium: 9.5 mg/dL (ref 8.6–10.2)
Chloride: 96 mmol/L — ABNORMAL LOW (ref 97–108)
Creatinine, Ser: 0.76 mg/dL (ref 0.57–1.00)
GFR calc Af Amer: 90 mL/min/{1.73_m2} (ref >59)
GFR calc non Af Amer: 78 mL/min/{1.73_m2} (ref >59)
Globulin, Total: 2.3 g/dL (ref 1.5–4.5)
Glucose: 137 mg/dL — ABNORMAL HIGH (ref 65–99)
Potassium: 4.6 mmol/L (ref 3.5–5.2)
Sodium: 138 mmol/L (ref 134–144)
Total Bilirubin: 0.6 mg/dL (ref 0.0–1.2)
Total Protein: 6.6 g/dL (ref 6.0–8.5)

## 2013-07-09 LAB — LIPID PANEL
Chol/HDL Ratio: 4 ratio units (ref 0.0–4.4)
Cholesterol, Total: 205 mg/dL — ABNORMAL HIGH (ref 100–199)
HDL: 51 mg/dL (ref >39)
LDL Calculated: 127 mg/dL — ABNORMAL HIGH (ref 0–99)
Triglycerides: 137 mg/dL (ref 0–149)
VLDL Cholesterol Cal: 27 mg/dL (ref 5–40)

## 2013-07-09 LAB — VITAMIN D 25 HYDROXY (VIT D DEFICIENCY, FRACTURES): Vit D, 25-Hydroxy: 17.4 ng/mL — ABNORMAL LOW (ref 30.0–100.0)

## 2013-07-09 MED ORDER — VITAMIN D (ERGOCALCIFEROL) 1.25 MG (50000 UNIT) PO CAPS: 50000.0000 [IU] | ORAL_CAPSULE | ORAL | Status: DC

## 2013-07-09 MED ORDER — PRAVASTATIN SODIUM 20 MG PO TABS: 20.0000 mg | ORAL_TABLET | Freq: Every day | ORAL | Status: DC

## 2013-07-11 ENCOUNTER — Telehealth: Payer: Self-pay | Admitting: Cardiovascular Disease

## 2013-07-11 DIAGNOSIS — I739 Peripheral vascular disease, unspecified: Secondary | ICD-10-CM

## 2013-07-11 NOTE — Telephone Encounter (Signed)
The pt is due for a carotid, aorto-iliac duplex, ABI and follow-up appointment with Dr Excell Seltzer. The orders for these tests are already in the system. I will forward this message to [facility] to contact the pt.

## 2013-07-11 NOTE — Telephone Encounter (Signed)
Patient is very frustrated because she can't get no one to call her back to schedule her test. Orders in chart are from 2013, I was unsure if I could use a order that old. Please call patient @ [phone].

## 2013-07-14 NOTE — Telephone Encounter (Signed)
Pt's PV studies have been scheduled on 07/23/13. The pt is scheduled to see Dr Excell Seltzer 08/13/13.

## 2013-07-15 ENCOUNTER — Ambulatory Visit (INDEPENDENT_AMBULATORY_CARE_PROVIDER_SITE_OTHER): Payer: Medicare Other | Admitting: Family Medicine

## 2013-07-15 ENCOUNTER — Encounter: Payer: Self-pay | Admitting: Family Medicine

## 2013-07-15 VITALS — BP 124/72 | HR 86 | Temp 97.8°F | Ht 61.0 in | Wt 125.4 lb

## 2013-07-15 DIAGNOSIS — E119 Type 2 diabetes mellitus without complications: Secondary | ICD-10-CM

## 2013-07-15 DIAGNOSIS — J329 Chronic sinusitis, unspecified: Secondary | ICD-10-CM

## 2013-07-15 DIAGNOSIS — E559 Vitamin D deficiency, unspecified: Secondary | ICD-10-CM

## 2013-07-15 DIAGNOSIS — E785 Hyperlipidemia, unspecified: Secondary | ICD-10-CM

## 2013-07-15 MED ORDER — AMOXICILLIN 875 MG PO TABS: 875.0000 mg | ORAL_TABLET | Freq: Two times a day (BID) | ORAL | Status: DC

## 2013-07-15 MED ORDER — PRAVASTATIN SODIUM 20 MG PO TABS: 20.0000 mg | ORAL_TABLET | Freq: Every day | ORAL | Status: DC

## 2013-07-15 NOTE — Patient Instructions (Signed)

## 2013-07-15 NOTE — Progress Notes (Signed)
  Subjective:    Patient ID: Hannah French, female    DOB: 1941/03/18, 72 y.o.   MRN: [id number]  HPI This 72 y.o. female presents for evaluation of follow up visit.  She is taking vitamin D 1000 iu po qd. She has had labs and her vitaminD was low.  She had elevated lipids and elevated hgbaic.  She  Has been having pain in her face on the left side.  She smokes cigarettes 1ppd x 40 years. She denies any bronchitis sx's or SOB.   Review of Systems C/o left maxillary sinus discomfort. No chest pain, SOB, HA, dizziness, vision change, N/V, diarrhea, constipation, dysuria, urinary urgency or frequency, myalgias, arthralgias or rash.     Objective:   Physical Exam Vital signs noted  Well developed well nourished female.  HEENT - Head atraumatic Normocephalic                Eyes - PERRLA, Conjuctiva - clear Sclera- Clear EOMI                Ears - EAC's Wnl TM's Wnl Gross Hearing WNL                Nose - Nares boggy and TTP left maxillary sinuses                Throat - oropharanx wnl Respiratory - Lungs with with exp wheezes scattered Cardiac - RRR S1 and S2 w/o Murmur GI - Abdomen soft Nontender and bowel sounds active x 4 Extremities - No edema. Neuro - Grossly intact.       Assessment & Plan:  Sinusitis - amoxicillin 875mg  one po bid x 2 weeks  Unspecified vitamin D deficiency - Continue vitamin D rx and continue vitamin D 1000 iu po qd otc  Diabetes - Discussed need to take amaryl and she states she only likes to take it when she gets Knee injections by orthopedics.  She is advised to diet and exercise.  Tobacco abuse - Encouraged patient to dc smoking. Encouraged CXR and she wants to wait. She denies SOB.  Deatra Canter FNP

## 2013-07-16 ENCOUNTER — Other Ambulatory Visit: Payer: Self-pay

## 2013-07-16 MED ORDER — FUROSEMIDE 20 MG PO TABS: 20.0000 mg | ORAL_TABLET | Freq: Every day | ORAL | Status: DC

## 2013-07-16 MED ORDER — GLIMEPIRIDE 4 MG PO TABS: ORAL_TABLET | ORAL | Status: DC

## 2013-07-23 ENCOUNTER — Ambulatory Visit (HOSPITAL_BASED_OUTPATIENT_CLINIC_OR_DEPARTMENT_OTHER): Payer: Medicare Other

## 2013-07-23 ENCOUNTER — Ambulatory Visit (HOSPITAL_COMMUNITY): Payer: Medicare Other | Attending: Cardiovascular Disease

## 2013-07-23 DIAGNOSIS — I739 Peripheral vascular disease, unspecified: Secondary | ICD-10-CM

## 2013-07-23 DIAGNOSIS — I658 Occlusion and stenosis of other precerebral arteries: Secondary | ICD-10-CM | POA: Insufficient documentation

## 2013-07-23 DIAGNOSIS — I10 Essential (primary) hypertension: Secondary | ICD-10-CM | POA: Insufficient documentation

## 2013-07-23 DIAGNOSIS — E785 Hyperlipidemia, unspecified: Secondary | ICD-10-CM | POA: Insufficient documentation

## 2013-07-23 DIAGNOSIS — I6529 Occlusion and stenosis of unspecified carotid artery: Secondary | ICD-10-CM | POA: Insufficient documentation

## 2013-07-23 DIAGNOSIS — E119 Type 2 diabetes mellitus without complications: Secondary | ICD-10-CM | POA: Insufficient documentation

## 2013-07-23 DIAGNOSIS — F17200 Nicotine dependence, unspecified, uncomplicated: Secondary | ICD-10-CM | POA: Insufficient documentation

## 2013-07-29 ENCOUNTER — Other Ambulatory Visit (INDEPENDENT_AMBULATORY_CARE_PROVIDER_SITE_OTHER): Payer: Medicare Other

## 2013-07-29 DIAGNOSIS — Z1212 Encounter for screening for malignant neoplasm of rectum: Secondary | ICD-10-CM

## 2013-07-29 NOTE — Progress Notes (Unsigned)
Pt dropped off FOBT only 

## 2013-07-31 ENCOUNTER — Encounter: Payer: Self-pay | Admitting: *Deleted

## 2013-07-31 LAB — FECAL OCCULT BLOOD, IMMUNOCHEMICAL: Fecal Occult Bld: NEGATIVE

## 2013-07-31 NOTE — Progress Notes (Signed)
Quick Note:  Copy of labs sent to patient ______ 

## 2013-08-06 ENCOUNTER — Ambulatory Visit (INDEPENDENT_AMBULATORY_CARE_PROVIDER_SITE_OTHER): Payer: Medicare Other

## 2013-08-06 DIAGNOSIS — Z23 Encounter for immunization: Secondary | ICD-10-CM

## 2013-08-13 ENCOUNTER — Ambulatory Visit (INDEPENDENT_AMBULATORY_CARE_PROVIDER_SITE_OTHER): Payer: Medicare Other | Admitting: Cardiovascular Disease

## 2013-08-13 ENCOUNTER — Encounter: Payer: Self-pay | Admitting: Cardiovascular Disease

## 2013-08-13 DIAGNOSIS — I739 Peripheral vascular disease, unspecified: Secondary | ICD-10-CM

## 2013-08-13 DIAGNOSIS — I6529 Occlusion and stenosis of unspecified carotid artery: Secondary | ICD-10-CM

## 2013-08-13 DIAGNOSIS — E785 Hyperlipidemia, unspecified: Secondary | ICD-10-CM

## 2013-08-13 MED ORDER — ROSUVASTATIN CALCIUM 5 MG PO TABS: 5.0000 mg | ORAL_TABLET | Freq: Every day | ORAL | Status: DC

## 2013-08-13 NOTE — Patient Instructions (Addendum)
Your physician wants you to follow-up in: 1 YEAR with Dr Excell Seltzer.  You will receive a reminder letter in the mail two months in advance. If you don't receive a letter, please call our office to schedule the follow-up appointment.  Your physician has requested that you have an ankle brachial index (ABI) in 1 YEAR. During this test an ultrasound and blood pressure cuff are used to evaluate the arteries that supply the arms and legs with blood. Allow thirty minutes for this exam. There are no restrictions or special instructions.  Your physician has requested that you have a carotid duplex in 1 YEAR. This test is an ultrasound of the carotid arteries in your neck. It looks at blood flow through these arteries that supply the brain with blood. Allow one hour for this exam. There are no restrictions or special instructions.  Your physician has recommended that you have an aorto-iliac duplex in 1 YEAR.   Your physician has recommended you make the following change in your medication: START Crestor 5mg  take one by mouth every evening  Your physician recommends that you return for a FASTING LIPID and LIVER profile in [facility] 2015--nothing to eat or drink after midnight, lab opens at 7:30 AM

## 2013-08-13 NOTE — Progress Notes (Signed)
HPI:  72 year old woman presenting for followup evaluation. She is followed for asymptomatic carotid disease and lower extremity PAD. She had recent vascular studies demonstrating ABIs of 1.1 on the right and 1.2 on the left. The patient has known iliac stenosis and her aortoiliac duplex demonstrated severe but stable common iliac disease with a peak velocity of 411 cm/s on the right and 434 cm/s on the left. Her carotid scan showed 40-59% stenosis on the right internal carotid artery and 60-79% internal carotid artery stenosis on the left. The left subclavian velocities were severely elevated and there was evidence of mild steal with 2 and fro flow in the left vertebral artery. Recent lipid showed cholesterol of 205, triglycerides 137, HDL 51, and LDL 127. Renal and liver function was normal.  The patient is doing well clinically. She denies chest pain, shortness of breath, leg pain at rest or with ambulation, arm pain, lightheadedness, or syncope. She continues to smoke about one pack of cigarettes daily. She was unable to tolerate pravastatin because of muscle aches. She's been compliant with her other medications.  Outpatient Encounter Prescriptions as of 08/13/2013  Medication Sig  . B Complex Vitamins (VITAMIN B COMPLEX PO) Take by mouth daily.  . furosemide (LASIX) 20 MG tablet Take 20 mg by mouth as needed.  Marland Kitchen glimepiride (AMARYL) 4 MG tablet only Take one tablet daily after steroid shot  . ibuprofen (ADVIL,MOTRIN) 200 MG tablet Take 200 mg by mouth as needed.    . Vitamin D, Ergocalciferol, (DRISDOL) 50000 UNITS CAPS capsule Take 1 capsule (50,000 Units total) by mouth every 7 (seven) days.  . [DISCONTINUED] furosemide (LASIX) 20 MG tablet Take 1 tablet (20 mg total) by mouth daily.  . [DISCONTINUED] glimepiride (AMARYL) 4 MG tablet Take one tablet daily  . [DISCONTINUED] amoxicillin (AMOXIL) 875 MG tablet Take 1 tablet (875 mg total) by mouth 2 (two) times daily.  . [DISCONTINUED]  pravastatin (PRAVACHOL) 20 MG tablet Take 1 tablet (20 mg total) by mouth daily.    Allergies  Allergen Reactions  . Iohexol   . Metformin   . Rofecoxib     Past Medical History  Diagnosis Date  . Heart murmur   . Mitral valve prolapse   . Dyslipidemia   . Bladder cancer   . Pseudogout   . Diabetes mellitus     Type II  . PVD (peripheral vascular disease)   . Abdominal bruit   . Carotid bruit     ROS: Negative except as per HPI  BP 112/84  Pulse 90  Ht 5' 1.5" (1.562 m)  Wt 121 lb (54.885 kg)  BMI 22.50 kg/m2  PHYSICAL EXAM: Pt is alert and oriented, NAD HEENT: normal Neck: JVP - normal, carotids 2+= with bilateral bruits Lungs: Scattered rhonchi CV: RRR without murmur or gallop Abd: soft, NT, Positive BS, no hepatomegaly Ext: no C/C/E, distal pulses intact and equal Skin: warm/dry no rash  EKG:  Normal sinus rhythm 90 beats per minute. Nonspecific ST abnormality.  ASSESSMENT AND PLAN: 1. Carotid atherosclerosis without history of stroke. The patient has moderate carotid disease and will return for serial duplex ultrasonography in 6 months. She continues on antiplatelet therapy with aspirin. Will try another low-dose statin for risk reduction with Crestor 5 mg. She will return for lipid panel in 3 months. I would like to see her back in one year for followup.  2. Left subclavian stenosis. Peripheral pulse exam is normal. She has no symptoms of arm claudication  or steal. Continue observation with a repeat ultrasound in 6 months.  3. Lower extremity peripheral arterial disease (severe bilateral iliac artery stenosis). The patient is asymptomatic. She is appropriately on aspirin.  4. Hyperlipidemia. At Crestor 5 mg. Recent lipids reviewed.  5. Tobacco. Lengthy discussion about the need for complete cessation. I stressed to her the tobacco as the cause of her carotid, subclavian, and lower extremity arterial disease.  Tonny Bollman 08/13/2013 10:02 AM

## 2013-08-19 ENCOUNTER — Encounter: Payer: Self-pay | Admitting: Family Medicine

## 2013-08-19 ENCOUNTER — Ambulatory Visit (INDEPENDENT_AMBULATORY_CARE_PROVIDER_SITE_OTHER): Payer: Medicare Other | Admitting: Family Medicine

## 2013-08-19 VITALS — BP 102/61 | HR 89 | Temp 98.6°F | Ht 61.5 in | Wt 123.0 lb

## 2013-08-19 DIAGNOSIS — Z7189 Other specified counseling: Secondary | ICD-10-CM

## 2013-08-19 DIAGNOSIS — J329 Chronic sinusitis, unspecified: Secondary | ICD-10-CM

## 2013-08-19 DIAGNOSIS — Z716 Tobacco abuse counseling: Secondary | ICD-10-CM

## 2013-08-19 DIAGNOSIS — F17200 Nicotine dependence, unspecified, uncomplicated: Secondary | ICD-10-CM

## 2013-08-19 MED ORDER — AMOXICILLIN-POT CLAVULANATE 875-125 MG PO TABS: 1.0000 | ORAL_TABLET | Freq: Two times a day (BID) | ORAL | Status: DC

## 2013-08-19 NOTE — Progress Notes (Signed)
  Subjective:    Patient ID: Hannah French, female    DOB: [birth year], 72 y.o.   MRN: [id number]  HPI  SINUSITIS Onset:  2 months  Location: L sided maxillary sinuses  Description:L sided pain, facial pressure and tooth pain.   Modifying factors: Has been seen by dentistry and NP here for sxs within the last month. Dental abscess ruled out per pt. Was placed on course of amox that incompletely resolved sxs. Smokes 1 PPD.   Symptoms Cough:  Chronic in setting of smoking  Discharge:  no Fever: no Sinus Pressure:  yes Ears Blocked:  yes Teeth Ache:  yes Frontal Headache:  yes Second Sickening:  no  Red Flags Change in mental state: no Change in vision: no    Patient Active Problem List   Diagnosis Date Noted  . Occlusion and stenosis of carotid artery without mention of cerebral infarction 07/22/2012  . PVD 07/02/2009  . BLADDER CANCER 06/05/2009  . DIABETES MELLITUS, TYPE II 06/05/2009  . DYSLIPIDEMIA 06/05/2009  . PSEUDOGOUT 06/05/2009  . MITRAL VALVE PROLAPSE 06/05/2009  . HEART MURMUR, HX OF 06/05/2009  . LEG PAIN 05/20/2009  . Other symptoms involving cardiovascular system 05/07/2009      Review of Systems  All other systems reviewed and are negative.       Objective:   Physical Exam  Constitutional: She is oriented to person, place, and time. She appears well-developed and well-nourished.  HENT:  Head: Normocephalic and atraumatic.  Right Ear: External ear normal.  Left Ear: External ear normal.  L sided maxillary facial pressure and pain  Mild nasal erythema  No focal oral lesions    Eyes: Conjunctivae are normal. Pupils are equal, round, and reactive to light.  Neck: Normal range of motion. Neck supple.  Cardiovascular: Normal rate and regular rhythm.   Pulmonary/Chest: Effort normal and breath sounds normal.  Abdominal: Soft.  Musculoskeletal: Normal range of motion.  Neurological: She is alert and oriented to person, place, and time.  Skin:  Skin is warm.          Assessment & Plan:  Sinusitis - Plan: amoxicillin-clavulanate (AUGMENTIN) 875-125 MG per tablet  Will treat with an extended course of augmentin for treatment.  No red flags on exam.  Discussed smoking cessation at length.  Also discussed ENT red flags at length including worsening pain, HA, vision change.  Consider ENT referral vs. maxiofacial CT if sxs persist despite treatment.  Follow up as needed.

## 2013-08-26 ENCOUNTER — Ambulatory Visit: Payer: Medicare Other

## 2013-09-10 ENCOUNTER — Ambulatory Visit: Payer: Medicare Other | Admitting: Cardiovascular Disease

## 2013-09-16 ENCOUNTER — Telehealth: Payer: Self-pay | Admitting: Family Medicine

## 2013-09-16 ENCOUNTER — Other Ambulatory Visit: Payer: Self-pay | Admitting: Family Medicine

## 2013-09-19 MED ORDER — AMOXICILLIN-POT CLAVULANATE 875-125 MG PO TABS: 1.0000 | ORAL_TABLET | Freq: Two times a day (BID) | ORAL | Status: DC

## 2013-09-19 NOTE — Telephone Encounter (Signed)
Patient called complaining with sinusitis and she wants a refill on augmentin. I spoke with [facility] and she said ok to refill.

## 2013-09-19 NOTE — Telephone Encounter (Signed)
Patient has chronic sinusitis and wants a refill on augmentin

## 2013-09-22 ENCOUNTER — Telehealth: Payer: Self-pay | Admitting: Family Medicine

## 2013-09-23 ENCOUNTER — Encounter: Payer: Self-pay | Admitting: Cardiovascular Disease

## 2013-09-23 NOTE — Telephone Encounter (Signed)
Patient states that Dr.newton told her he would set up a CT of her head if she didn't get any better. She is not any better and would like for you to go ahead and set up the referral.

## 2013-09-23 NOTE — Telephone Encounter (Signed)
NTBS.

## 2013-10-01 ENCOUNTER — Encounter: Payer: Self-pay | Admitting: Family Medicine

## 2013-10-01 ENCOUNTER — Ambulatory Visit (INDEPENDENT_AMBULATORY_CARE_PROVIDER_SITE_OTHER): Payer: Medicare Other | Admitting: Family Medicine

## 2013-10-01 VITALS — BP 157/72 | HR 89 | Temp 97.3°F | Ht 61.5 in | Wt 126.0 lb

## 2013-10-01 DIAGNOSIS — R51 Headache: Secondary | ICD-10-CM

## 2013-10-01 DIAGNOSIS — J329 Chronic sinusitis, unspecified: Secondary | ICD-10-CM

## 2013-10-01 DIAGNOSIS — E119 Type 2 diabetes mellitus without complications: Secondary | ICD-10-CM

## 2013-10-01 MED ORDER — LEVOFLOXACIN 500 MG PO TABS: 500.0000 mg | ORAL_TABLET | Freq: Every day | ORAL | Status: DC

## 2013-10-01 NOTE — Progress Notes (Signed)
   Subjective:    Patient ID: Hannah French, female    DOB: 1941-03-14, 73 y.o.   MRN: [id number]  HPI Patient presents today with chief complaint of recurrent sinusitis. Patient hasn't seen multiple times for recurrent maxillary sinusitis. Has had multiple rounds of Augmentin for treatment. Last flare was over the Christmas holiday. Was seen by myself one 2 months ago for sinusitis. Discussed with patient ENT followup versus maxillofacial CT imaging. Patient like a maxillofacial CT at this time. Still smoking one pack per day. Also the baseline history of diabetes. On Amaryl daily. Has also had some left posterior head/neck pain over the past 3-4 months. No known injury. No known trauma. Pain is fairly constant, though mild in nature. Some radiation into the posterior neck. Pain not worse with movement.  Review of Systems  All other systems reviewed and are negative.       Objective:   Physical Exam  Constitutional: She is oriented to person, place, and time. She appears well-developed and well-nourished.  HENT:  Head: Normocephalic and atraumatic.  Right Ear: External ear normal.  Left Ear: External ear normal.  +nasal erythema, rhinorrhea bilaterally, + post oropharyngeal erythema  Mild maxillary sinus tenderness to palpation bilaterally.  Eyes: Conjunctivae are normal. Pupils are equal, round, and reactive to light.  Neck: Normal range of motion. Neck supple.    Cardiovascular: Normal rate and regular rhythm.   Pulmonary/Chest: Effort normal and breath sounds normal.  Abdominal: Soft.  Neurological: She is alert and oriented to person, place, and time.  Skin: Skin is warm.          Assessment & Plan:  Sinusitis, chronic - Plan: Ambulatory referral to ENT, CT Soft Tissue Neck W Contrast, CBC With differential/Platelet  Headache(784.0) - Plan: CT Head Wo Contrast  DM (diabetes mellitus) - Plan: Comprehensive metabolic panel, CANCELED: POCT glycosylated  hemoglobin (Hb A1C)  Fairly broad differential diagnosis for symptoms. We'll start patient on Levaquin for sinusitis treatment. Discussed Depo-Medrol to help with sinus pain. Patient deferred. Also a broad differential for posterior head/neck pain. May be reactive lymphadenopathy in the setting of recurrent sinusitis versus next rain versus atypical lesion. Notify history of bladder cancer in patient who is an active smoker. We'll obtain CT of the neck to better assess anatomy. We'll include head CT as well. Also check baseline labs including a CBC. Discussed smoking cessation at length. We'll need ENT followup. Followup as needed.

## 2013-10-13 ENCOUNTER — Telehealth: Payer: Self-pay | Admitting: Family Medicine

## 2013-10-20 ENCOUNTER — Other Ambulatory Visit: Payer: Self-pay | Admitting: *Deleted

## 2013-10-20 DIAGNOSIS — J329 Chronic sinusitis, unspecified: Secondary | ICD-10-CM

## 2013-10-20 MED ORDER — PREDNISONE 50 MG PO TABS: ORAL_TABLET | ORAL | Status: DC

## 2013-10-20 NOTE — Progress Notes (Signed)
Patient is allergic to IV dye. Per the radiology departments policy I ordered prednisone 50mg  to be taken 13 hrs, 7hrs, and 1 hr prior to the study. She is to take Benadryl 50mg  1 hr prior as well. Patient is not being treated for elevated blood sugar but has in the past. Advised her that prednisone will elevate her blood sugar and she should keep a check on it.  Patient stated understanding and agreement to plan. Suggested she have someone drive her to the study because the Benadryl may make her sleepy.  Discussed with Dr. Laurance Flatten and ordered in [place].

## 2013-10-21 ENCOUNTER — Ambulatory Visit (HOSPITAL_COMMUNITY): Admission: RE | Admit: 2013-10-21 | Payer: Medicare Other | Source: Ambulatory Visit

## 2013-10-21 ENCOUNTER — Telehealth: Payer: Self-pay | Admitting: *Deleted

## 2013-10-21 ENCOUNTER — Ambulatory Visit (HOSPITAL_COMMUNITY)
Admission: RE | Admit: 2013-10-21 | Discharge: 2013-10-21 | Disposition: A | Discharge status: Home / Self Care | Payer: Medicare Other | Source: Ambulatory Visit | Attending: Family Medicine | Admitting: Family Medicine

## 2013-10-21 ENCOUNTER — Ambulatory Visit (HOSPITAL_COMMUNITY): Payer: Medicare Other

## 2013-10-21 DIAGNOSIS — H5710 Ocular pain, unspecified eye: Secondary | ICD-10-CM | POA: Insufficient documentation

## 2013-10-21 DIAGNOSIS — R51 Headache: Secondary | ICD-10-CM | POA: Insufficient documentation

## 2013-10-21 DIAGNOSIS — J329 Chronic sinusitis, unspecified: Secondary | ICD-10-CM

## 2013-10-21 DIAGNOSIS — E119 Type 2 diabetes mellitus without complications: Secondary | ICD-10-CM | POA: Insufficient documentation

## 2013-10-21 DIAGNOSIS — I739 Peripheral vascular disease, unspecified: Secondary | ICD-10-CM | POA: Insufficient documentation

## 2013-10-21 DIAGNOSIS — Z8551 Personal history of malignant neoplasm of bladder: Secondary | ICD-10-CM | POA: Insufficient documentation

## 2013-10-21 NOTE — Telephone Encounter (Signed)
Patient went to CT appt at [facility] but refused to take contrast. Spoke with Dr on call [employer] and he suggested an MRI. Please advise

## 2013-10-21 NOTE — Telephone Encounter (Signed)
Refer pt to ENT for issue. Discussed this at last visit. They can make a decision about imaging modality.  Go to ER if headache worsens.

## 2013-10-22 NOTE — Telephone Encounter (Signed)
Pt said she doesn't know why we ordered a CT of neck, She said she had ct of head done. She said she is allergic to contrast and very upset that it was ordered. She wants you to call her. Pt has appt tom with ENT

## 2013-10-22 NOTE — Telephone Encounter (Signed)
Will do ENT referral and they can decide what test to run

## 2013-10-23 NOTE — Telephone Encounter (Signed)
Pt. Notified and verbalized understanding

## 2013-10-23 NOTE — Telephone Encounter (Signed)
Neck pain was part of pt's chief complaint when she saw me last, which is why I ordered this test. I will call her as soon as I can. Otherwise follow up with ENT.

## 2013-10-28 ENCOUNTER — Telehealth: Payer: Self-pay | Admitting: Family Medicine

## 2013-10-31 NOTE — Telephone Encounter (Signed)
Please see note on 1/7 image report.

## 2013-10-31 NOTE — Telephone Encounter (Signed)
Had CT done on 1-27. Please review

## 2013-10-31 NOTE — Telephone Encounter (Signed)
Spoke with patient and she wants to know what her next steps should be she is still having the facial pressure please advise

## 2013-11-01 NOTE — Telephone Encounter (Signed)
She needs to follow up with ENT like previously discussed.

## 2013-11-03 NOTE — Telephone Encounter (Signed)
Patient stated she seen ENT and they told her that it was not her sinus and she doesn't know where to go from here. Even though the CT scan stated mild sinus disease.

## 2013-11-04 NOTE — Telephone Encounter (Signed)
Id like to see what the ENT note mentioned before any decisions are made.  I am deferring any sinus issues to ENT from this point forward.

## 2013-11-04 NOTE — Telephone Encounter (Signed)
Contacted Dr. Redmond Baseman nurse at [facility] and requested that she fax over office notes. After Dr. Ernestina Patches reviews the notes we will be able to move forward.

## 2013-12-16 ENCOUNTER — Other Ambulatory Visit: Payer: Medicare Other

## 2013-12-22 ENCOUNTER — Ambulatory Visit (INDEPENDENT_AMBULATORY_CARE_PROVIDER_SITE_OTHER): Payer: Medicare Other | Admitting: Family Medicine

## 2013-12-22 ENCOUNTER — Encounter: Payer: Self-pay | Admitting: Family Medicine

## 2013-12-22 VITALS — BP 118/68 | HR 88 | Temp 97.4°F | Ht 61.5 in | Wt 127.0 lb

## 2013-12-22 DIAGNOSIS — E785 Hyperlipidemia, unspecified: Secondary | ICD-10-CM

## 2013-12-22 DIAGNOSIS — R5381 Other malaise: Secondary | ICD-10-CM

## 2013-12-22 DIAGNOSIS — E559 Vitamin D deficiency, unspecified: Secondary | ICD-10-CM

## 2013-12-22 DIAGNOSIS — E119 Type 2 diabetes mellitus without complications: Secondary | ICD-10-CM

## 2013-12-22 DIAGNOSIS — R5383 Other fatigue: Secondary | ICD-10-CM

## 2013-12-22 LAB — POCT CBC
Granulocyte percent: 70.6 %G (ref 37–80)
HCT, POC: 43.2 % (ref 37.7–47.9)
Hemoglobin: 13.7 g/dL (ref 12.2–16.2)
Lymph, poc: 2 (ref 0.6–3.4)
MCH, POC: 29.8 pg (ref 27–31.2)
MCHC: 31.7 g/dL — AB (ref 31.8–35.4)
MCV: 94.2 fL (ref 80–97)
MPV: 8.7 fL (ref 0–99.8)
POC Granulocyte: 5.6 (ref 2–6.9)
POC LYMPH PERCENT: 24.7 %L (ref 10–50)
Platelet Count, POC: 249 10*3/uL (ref 142–424)
RBC: 4.6 M/uL (ref 4.04–5.48)
RDW, POC: 13 %
WBC: 7.9 10*3/uL (ref 4.6–10.2)

## 2013-12-24 LAB — CMP14+EGFR
ALT: 10 IU/L (ref 0–32)
AST: 14 IU/L (ref 0–40)
Albumin/Globulin Ratio: 1.5 (ref 1.1–2.5)
Albumin: 4.3 g/dL (ref 3.5–4.8)
Alkaline Phosphatase: 62 IU/L (ref 39–117)
BUN/Creatinine Ratio: 24 (ref 11–26)
BUN: 15 mg/dL (ref 8–27)
CO2: 22 mmol/L (ref 18–29)
Calcium: 9.9 mg/dL (ref 8.7–10.3)
Chloride: 99 mmol/L (ref 97–108)
Creatinine, Ser: 0.62 mg/dL (ref 0.57–1.00)
GFR calc Af Amer: 103 mL/min/{1.73_m2} (ref >59)
GFR calc non Af Amer: 90 mL/min/{1.73_m2} (ref >59)
Globulin, Total: 2.8 g/dL (ref 1.5–4.5)
Glucose: 133 mg/dL — ABNORMAL HIGH (ref 65–99)
Potassium: 4.5 mmol/L (ref 3.5–5.2)
Sodium: 141 mmol/L (ref 134–144)
Total Bilirubin: 0.6 mg/dL (ref 0.0–1.2)
Total Protein: 7.1 g/dL (ref 6.0–8.5)

## 2013-12-24 LAB — NMR, LIPOPROFILE
Cholesterol: 192 mg/dL (ref <200)
HDL Cholesterol by NMR: 50 mg/dL (ref >=40)
HDL Particle Number: 30.1 umol/L — ABNORMAL LOW (ref >=30.5)
LDL Particle Number: 1573 nmol/L — ABNORMAL HIGH (ref <1000)
LDL Size: 21.1 nm (ref >20.5)
LDLC SERPL CALC-MCNC: 120 mg/dL — ABNORMAL HIGH (ref <100)
LP-IR Score: <25 (ref <=45)
Small LDL Particle Number: 138 nmol/L (ref <=527)
Triglycerides by NMR: 109 mg/dL (ref <150)

## 2013-12-24 LAB — VITAMIN D 25 HYDROXY (VIT D DEFICIENCY, FRACTURES): Vit D, 25-Hydroxy: 37.9 ng/mL (ref 30.0–100.0)

## 2013-12-29 ENCOUNTER — Ambulatory Visit (INDEPENDENT_AMBULATORY_CARE_PROVIDER_SITE_OTHER): Payer: Medicare Other | Admitting: Family Medicine

## 2013-12-29 ENCOUNTER — Encounter: Payer: Self-pay | Admitting: Family Medicine

## 2013-12-29 VITALS — BP 143/77 | HR 78 | Temp 98.0°F | Ht 61.5 in | Wt 129.0 lb

## 2013-12-29 DIAGNOSIS — Z716 Tobacco abuse counseling: Secondary | ICD-10-CM

## 2013-12-29 DIAGNOSIS — Z7189 Other specified counseling: Secondary | ICD-10-CM

## 2013-12-29 DIAGNOSIS — F17200 Nicotine dependence, unspecified, uncomplicated: Secondary | ICD-10-CM

## 2013-12-29 DIAGNOSIS — E119 Type 2 diabetes mellitus without complications: Secondary | ICD-10-CM

## 2013-12-29 LAB — POCT GLYCOSYLATED HEMOGLOBIN (HGB A1C): Hemoglobin A1C: 7.4

## 2013-12-29 MED ORDER — CHOLECALCIFEROL 25 MCG (1000 UT) PO TABS: 1000.0000 [IU] | ORAL_TABLET | Freq: Every day | ORAL | Status: DC

## 2013-12-29 NOTE — Progress Notes (Signed)
   Subjective:    Patient ID: Hannah French, female    DOB: May 21, 1941, 73 y.o.   MRN: [id number]  HPI Pt presents today for chronic problem follow up  No acute issues currently   Type 2 DM: on amaryl 4 mg daily. Only taking 1/2 dose. No polyuria,polydypsia. Tries to eat lower carb diet.   HLD: noted elevated lipoprofile despite being on crestor 5 mg daily. Was instructed to increase dose to 10mg . However, pt states that she did not get the meassage. Trying to work on fat intake. Still smoking 1 PPD. Baseline hx/o carotid artery stenosis. L sided per pt. Followed serially in [place]. No weakness, hemiparesis. Still on ASA.   On vitamin D 1000mg  daily. Last vitamin D level WNL. Pt states that she had a normal DEXA within the last 2 years.   Review of Systems  All other systems reviewed and are negative.       Objective:   Physical Exam  Constitutional: She appears well-developed and well-nourished.  HENT:  Head: Normocephalic and atraumatic.  Eyes: Conjunctivae are normal. Pupils are equal, round, and reactive to light.  Neck: Normal range of motion. Neck supple.  Cardiovascular: Normal rate and regular rhythm.   Pulmonary/Chest: Effort normal. She has wheezes.  Abdominal: Soft.  Musculoskeletal: Normal range of motion.  Neurological: She is alert.  Skin: Skin is warm.          Assessment & Plan:  Diabetes - Plan: POCT glycosylated hemoglobin (Hb A1C)  A1C 7.4 today. Resume amaryl @ 4mg .  Continue vitamin D.  Follow up 3-6 months for recheck.  Quit smoking.

## 2014-03-29 NOTE — Progress Notes (Signed)
SUBJECTIVE: 73 y.o. female for follow up of diabetes. Diabetic Review of Systems - medication compliance: noncompliant some of the time.  Other symptoms and concerns: no other significant complaints.  Current Outpatient Prescriptions  Medication Sig Dispense Refill  . aspirin 81 MG tablet Take 81 mg by mouth daily.      . B Complex Vitamins (VITAMIN B COMPLEX PO) Take by mouth daily.      . furosemide (LASIX) 20 MG tablet Take 20 mg by mouth as needed.      Marland Kitchen glimepiride (AMARYL) 4 MG tablet only Take one tablet daily after steroid shot      . ibuprofen (ADVIL,MOTRIN) 200 MG tablet Take 200 mg by mouth as needed.        . rosuvastatin (CRESTOR) 5 MG tablet Take 1 tablet (5 mg total) by mouth daily.  30 tablet  6  . Cholecalciferol 1000 UNITS tablet Take 1 tablet (1,000 Units total) by mouth daily.  60 tablet  3   No current facility-administered medications for this visit.    OBJECTIVE: Appearance: alert, well appearing, and in no distress. BP 118/68  Pulse 88  Temp(Src) 97.4 F (36.3 C) (Oral)  Ht 5' 1.5" (1.562 m)  Wt 127 lb (57.607 kg)  BMI 23.61 kg/m2 Gen: up in chair, NAD HEENT: NCAT, EOMI, TMs clear bilaterally CV: RRR, no murmurs auscultated PULM: CTAB, no wheezes, rales, rhoncii ABD: S/NT/+ bowel sounds  EXT: 2+ peripheral pulses   ASSESSMENT: Orders Placed This Encounter  Procedures  . CMP14+EGFR  . NMR, lipoprofile  . Vit D  25 hydroxy (rtn osteoporosis monitoring)  . POCT CBC   Recheck baseline labs.  Continue treatment regimen pending follow up on blood work.  Follow up as needed.

## 2014-06-09 ENCOUNTER — Ambulatory Visit (INDEPENDENT_AMBULATORY_CARE_PROVIDER_SITE_OTHER): Payer: Medicare Other

## 2014-06-09 ENCOUNTER — Encounter: Payer: Self-pay | Admitting: Nurse Practitioner

## 2014-06-09 ENCOUNTER — Ambulatory Visit (INDEPENDENT_AMBULATORY_CARE_PROVIDER_SITE_OTHER): Payer: Medicare Other | Admitting: Nurse Practitioner

## 2014-06-09 VITALS — BP 113/79 | HR 105 | Temp 98.1°F | Ht 61.5 in | Wt 129.0 lb

## 2014-06-09 DIAGNOSIS — Z8679 Personal history of other diseases of the circulatory system: Secondary | ICD-10-CM

## 2014-06-09 DIAGNOSIS — F17200 Nicotine dependence, unspecified, uncomplicated: Secondary | ICD-10-CM

## 2014-06-09 DIAGNOSIS — I6529 Occlusion and stenosis of unspecified carotid artery: Secondary | ICD-10-CM

## 2014-06-09 DIAGNOSIS — E785 Hyperlipidemia, unspecified: Secondary | ICD-10-CM

## 2014-06-09 DIAGNOSIS — Z13820 Encounter for screening for osteoporosis: Secondary | ICD-10-CM

## 2014-06-09 DIAGNOSIS — Z01419 Encounter for gynecological examination (general) (routine) without abnormal findings: Secondary | ICD-10-CM

## 2014-06-09 DIAGNOSIS — E559 Vitamin D deficiency, unspecified: Secondary | ICD-10-CM

## 2014-06-09 DIAGNOSIS — Z0000 Encounter for general adult medical examination without abnormal findings: Secondary | ICD-10-CM

## 2014-06-09 DIAGNOSIS — Z124 Encounter for screening for malignant neoplasm of cervix: Secondary | ICD-10-CM

## 2014-06-09 DIAGNOSIS — E119 Type 2 diabetes mellitus without complications: Secondary | ICD-10-CM

## 2014-06-09 LAB — POCT URINALYSIS DIPSTICK
Bilirubin, UA: NEGATIVE
Blood, UA: NEGATIVE
Glucose, UA: NEGATIVE
Ketones, UA: NEGATIVE
Nitrite, UA: NEGATIVE
Protein, UA: NEGATIVE
Spec Grav, UA: 1.01
Urobilinogen, UA: NEGATIVE
pH, UA: 6

## 2014-06-09 LAB — POCT CBC
Granulocyte percent: 79 %G (ref 37–80)
HCT, POC: 44.9 % (ref 37.7–47.9)
Hemoglobin: 14.9 g/dL (ref 12.2–16.2)
Lymph, poc: 2.2 (ref 0.6–3.4)
MCH, POC: 31.7 pg — AB (ref 27–31.2)
MCHC: 33.2 g/dL (ref 31.8–35.4)
MCV: 95.3 fL (ref 80–97)
MPV: 8.9 fL (ref 0–99.8)
POC Granulocyte: 9.1 — AB (ref 2–6.9)
POC LYMPH PERCENT: 18.8 %L (ref 10–50)
Platelet Count, POC: 264 10*3/uL (ref 142–424)
RBC: 4.7 M/uL (ref 4.04–5.48)
RDW, POC: 12.8 %
WBC: 11.5 10*3/uL — AB (ref 4.6–10.2)

## 2014-06-09 LAB — POCT UA - MICROSCOPIC ONLY
Bacteria, U Microscopic: NEGATIVE
Casts, Ur, LPF, POC: NEGATIVE
Crystals, Ur, HPF, POC: NEGATIVE
Mucus, UA: NEGATIVE
RBC, urine, microscopic: NEGATIVE
Yeast, UA: NEGATIVE

## 2014-06-09 LAB — POCT UA - MICROALBUMIN: Microalbumin Ur, POC: NEGATIVE mg/L

## 2014-06-09 LAB — POCT GLYCOSYLATED HEMOGLOBIN (HGB A1C): Hemoglobin A1C: 7.1

## 2014-06-09 MED ORDER — GLIMEPIRIDE 4 MG PO TABS: 4.0000 mg | ORAL_TABLET | Freq: Every day | ORAL | Status: DC

## 2014-06-09 MED ORDER — FUROSEMIDE 20 MG PO TABS: 20.0000 mg | ORAL_TABLET | ORAL | Status: DC | PRN

## 2014-06-09 NOTE — Progress Notes (Signed)
Subjective:    Patient ID: Hannah French, female    DOB: 1941/05/11, 73 y.o.   MRN: [id number]  Patient in today for CPE and PAP- She is doing well today without complaints.  Hyperlipidemia This is a chronic problem. The current episode started more than 1 year ago. The problem is uncontrolled. Recent lipid tests were reviewed and are high. Exacerbating diseases include diabetes. She has no history of hypothyroidism or obesity. Associated symptoms include myalgias (when took crestor). Pertinent negatives include no chest pain or shortness of breath. Treatments tried: was on crestor but stopped taking. The current treatment provides no improvement of lipids. Compliance problems include adherence to diet and adherence to exercise.  Risk factors for coronary artery disease include diabetes mellitus, dyslipidemia and post-menopausal.  Diabetes She presents for her follow-up diabetic visit. She has type 2 diabetes mellitus. No MedicAlert identification noted. Her disease course has been stable. Pertinent negatives for diabetes include no chest pain, no fatigue, no foot paresthesias, no weakness and no weight loss. Symptoms are stable. Risk factors for coronary artery disease include dyslipidemia and post-menopausal. Her weight is stable. She has not had a previous visit with a dietician. She rarely participates in exercise. Her breakfast blood glucose is taken between 8-9 am. Her breakfast blood glucose range is generally 110-130 mg/dl. Her overall blood glucose range is 110-130 mg/dl. An ACE inhibitor/angiotensin II receptor blocker is not being taken. She does not see a podiatrist.Eye exam is not current.  peripheral edema Lasix daily- keeps swelling down   Review of Systems  Constitutional: Negative for weight loss and fatigue.  Respiratory: Negative for shortness of breath.   Cardiovascular: Negative for chest pain.  Musculoskeletal: Positive for myalgias (when took crestor).  Neurological:  Negative for weakness.       Objective:   Physical Exam  Constitutional: She is oriented to person, place, and time. She appears well-developed and well-nourished.  HENT:  Head: Normocephalic.  Right Ear: Hearing, tympanic membrane, external ear and ear canal normal.  Left Ear: Hearing, tympanic membrane, external ear and ear canal normal.  Nose: Nose normal.  Mouth/Throat: Uvula is midline and oropharynx is clear and moist.  Eyes: Conjunctivae and EOM are normal. Pupils are equal, round, and reactive to light.  Neck: Normal range of motion and full passive range of motion without pain. Neck supple. No JVD present. Carotid bruit is not present. No mass and no thyromegaly present.  Cardiovascular: Normal rate, normal heart sounds and intact distal pulses.   No murmur heard. Pulmonary/Chest: Effort normal and breath sounds normal. Right breast exhibits no inverted nipple, no mass, no nipple discharge, no skin change and no tenderness. Left breast exhibits no inverted nipple, no mass, no nipple discharge, no skin change and no tenderness.  Abdominal: Soft. Bowel sounds are normal. She exhibits no mass. There is no tenderness.  Genitourinary: Vagina normal and uterus normal. No breast swelling, tenderness, discharge or bleeding.  bimanual exam-No adnexal masses or tenderness. Vaginal cuff intact  Musculoskeletal: Normal range of motion.  Lymphadenopathy:    She has no cervical adenopathy.  Neurological: She is alert and oriented to person, place, and time.  Skin: Skin is warm and dry.  Patch of erythematous patch with silvery plaque on left buttocks  Psychiatric: She has a normal mood and affect. Her behavior is normal. Judgment and thought content normal.   BP 113/79  Pulse 105  Temp(Src) 98.1 F (36.7 C) (Oral)  Ht 5' 1.5" (1.562 m)  Wt  129 lb (58.514 kg)  BMI 23.98 kg/m2  Results for orders placed in visit on 06/09/14  POCT GLYCOSYLATED HEMOGLOBIN (HGB A1C)      Result Value  Ref Range   Hemoglobin A1C 7.1    POCT UA - MICROSCOPIC ONLY      Result Value Ref Range   WBC, Ur, HPF, POC 15-20     RBC, urine, microscopic NEG     Bacteria, U Microscopic NEG     Mucus, UA NEG     Epithelial cells, urine per micros OCC     Crystals, Ur, HPF, POC NEG     Casts, Ur, LPF, POC NEG     Yeast, UA NEG    POCT URINALYSIS DIPSTICK      Result Value Ref Range   Color, UA YELLOW     Clarity, UA CLEAR     Glucose, UA NEG     Bilirubin, UA NEG     Ketones, UA NEG     Spec Grav, UA 1.010     Blood, UA NEG     pH, UA 6.0     Protein, UA NEG     Urobilinogen, UA negative     Nitrite, UA NEG     Leukocytes, UA moderate (2+)    POCT UA - MICROALBUMIN      Result Value Ref Range   Microalbumin Ur, POC neg           Assessment & Plan:   1. DIABETES MELLITUS, TYPE II   2. Occlusion and stenosis of carotid artery without mention of cerebral infarction   3. DYSLIPIDEMIA   4. Annual physical exam   5. HEART MURMUR, HX OF   6. Smoker   7. Screening for osteoporosis   8. Vitamin D deficiency   9. Encounter for routine gynecological examination    Orders Placed This Encounter  Procedures  . DG Bone Density    Standing Status: Future     Number of Occurrences:      Standing Expiration Date: 08/09/2015    Order Specific Question:  Reason for Exam (SYMPTOM  OR DIAGNOSIS REQUIRED)    Answer:  screening    Order Specific Question:  Preferred imaging location?    Answer:  Internal  . DG Chest 2 View    Standing Status: Future     Number of Occurrences:      Standing Expiration Date: 08/09/2015    Order Specific Question:  Reason for Exam (SYMPTOM  OR DIAGNOSIS REQUIRED)    Answer:  smoker    Order Specific Question:  Preferred imaging location?    Answer:  Internal  . CMP14+EGFR  . NMR, lipoprofile  . Thyroid Panel With TSH  . Vit D  25 hydroxy (rtn osteoporosis monitoring)  . POCT glycosylated hemoglobin (Hb A1C)  . POCT UA - Microscopic Only  . POCT  urinalysis dipstick  . POCT UA - Microalbumin  . POCT CBC   Meds ordered this encounter  Medications  . glimepiride (AMARYL) 4 MG tablet    Sig: Take 1 tablet (4 mg total) by mouth daily with breakfast. only Take one tablet daily after steroid shot    Dispense:  90 tablet    Refill:  1    Order Specific Question:  Supervising Provider    Answer:  Chipper Herb [[id number]]  . furosemide (LASIX) 20 MG tablet    Sig: Take 1 tablet (20 mg total) by mouth as needed.  Dispense:  90 tablet    Refill:  1    Order Specific Question:  Supervising Provider    Answer:  Joycelyn Man    Discussed weight management for patient with BMI> 25 Labs pending Health maintenance reviewed Diet and exercise encouraged Continue all meds Follow up  In 6 months   [place], FNP

## 2014-06-09 NOTE — Patient Instructions (Signed)
Smoking Cessation Quitting smoking is important to your health and has many advantages. However, it is not always easy to quit since nicotine is a very addictive drug. Oftentimes, people try 3 times or more before being able to quit. This document explains the best ways for you to prepare to quit smoking. Quitting takes hard work and a lot of effort, but you can do it. ADVANTAGES OF QUITTING SMOKING  You will live longer, feel better, and live better.  Your body will feel the impact of quitting smoking almost immediately.  Within 20 minutes, blood pressure decreases. Your pulse returns to its normal level.  After 8 hours, carbon monoxide levels in the blood return to normal. Your oxygen level increases.  After 24 hours, the chance of having a heart attack starts to decrease. Your breath, hair, and body stop smelling like smoke.  After 48 hours, damaged nerve endings begin to recover. Your sense of taste and smell improve.  After 72 hours, the body is virtually free of nicotine. Your bronchial tubes relax and breathing becomes easier.  After 2 to 12 weeks, lungs can hold more air. Exercise becomes easier and circulation improves.  The risk of having a heart attack, stroke, cancer, or lung disease is greatly reduced.  After 1 year, the risk of coronary heart disease is cut in half.  After 5 years, the risk of stroke falls to the same as a nonsmoker.  After 10 years, the risk of lung cancer is cut in half and the risk of other cancers decreases significantly.  After 15 years, the risk of coronary heart disease drops, usually to the level of a nonsmoker.  If you are pregnant, quitting smoking will improve your chances of having a healthy baby.  The people you live with, especially any children, will be healthier.  You will have extra money to spend on things other than cigarettes. QUESTIONS TO THINK ABOUT BEFORE ATTEMPTING TO QUIT You may want to talk about your answers with your  health care provider.  Why do you want to quit?  If you tried to quit in the past, what helped and what did not?  What will be the most difficult situations for you after you quit? How will you plan to handle them?  Who can help you through the tough times? Your family? Friends? A health care provider?  What pleasures do you get from smoking? What ways can you still get pleasure if you quit? Here are some questions to ask your health care provider:  How can you help me to be successful at quitting?  What medicine do you think would be best for me and how should I take it?  What should I do if I need more help?  What is smoking withdrawal like? How can I get information on withdrawal? GET READY  Set a quit date.  Change your environment by getting rid of all cigarettes, ashtrays, matches, and lighters in your home, car, or work. Do not let people smoke in your home.  Review your past attempts to quit. Think about what worked and what did not. GET SUPPORT AND ENCOURAGEMENT You have a better chance of being successful if you have help. You can get support in many ways.  Tell your family, friends, and coworkers that you are going to quit and need their support. Ask them not to smoke around you.  Get individual, group, or telephone counseling and support. Programs are available at local hospitals and health centers. Call   your local health department for information about programs in your area.  Spiritual beliefs and practices may help some smokers quit.  Download a "quit meter" on your computer to keep track of quit statistics, such as how long you have gone without smoking, cigarettes not smoked, and money saved.  Get a self-help book about quitting smoking and staying off tobacco. LEARN NEW SKILLS AND BEHAVIORS  Distract yourself from urges to smoke. Talk to someone, go for a walk, or occupy your time with a task.  Change your normal routine. Take a different route to work.  Drink tea instead of coffee. Eat breakfast in a different place.  Reduce your stress. Take a hot bath, exercise, or read a book.  Plan something enjoyable to do every day. Reward yourself for not smoking.  Explore interactive web-based programs that specialize in helping you quit. GET MEDICINE AND USE IT CORRECTLY Medicines can help you stop smoking and decrease the urge to smoke. Combining medicine with the above behavioral methods and support can greatly increase your chances of successfully quitting smoking.  Nicotine replacement therapy helps deliver nicotine to your body without the negative effects and risks of smoking. Nicotine replacement therapy includes nicotine gum, lozenges, inhalers, nasal sprays, and skin patches. Some may be available over-the-counter and others require a prescription.  Antidepressant medicine helps people abstain from smoking, but how this works is unknown. This medicine is available by prescription.  Nicotinic receptor partial agonist medicine simulates the effect of nicotine in your brain. This medicine is available by prescription. Ask your health care provider for advice about which medicines to use and how to use them based on your health history. Your health care provider will tell you what side effects to look out for if you choose to be on a medicine or therapy. Carefully read the information on the package. Do not use any other product containing nicotine while using a nicotine replacement product.  RELAPSE OR DIFFICULT SITUATIONS Most relapses occur within the first 3 months after quitting. Do not be discouraged if you start smoking again. Remember, most people try several times before finally quitting. You may have symptoms of withdrawal because your body is used to nicotine. You may crave cigarettes, be irritable, feel very hungry, cough often, get headaches, or have difficulty concentrating. The withdrawal symptoms are only temporary. They are strongest  when you first quit, but they will go away within 10-14 days. To reduce the chances of relapse, try to:  Avoid drinking alcohol. Drinking lowers your chances of successfully quitting.  Reduce the amount of caffeine you consume. Once you quit smoking, the amount of caffeine in your body increases and can give you symptoms, such as a rapid heartbeat, sweating, and anxiety.  Avoid smokers because they can make you want to smoke.  Do not let weight gain distract you. Many smokers will gain weight when they quit, usually less than 10 pounds. Eat a healthy diet and stay active. You can always lose the weight gained after you quit.  Find ways to improve your mood other than smoking. FOR MORE INFORMATION  www.smokefree.gov  Document Released: 09/05/2001 Document Revised: 01/26/2014 Document Reviewed: 12/21/2011 [employer] Patient Information 2015 [employer]. This information is not intended to replace advice given to you by your health care provider. Make sure you discuss any questions you have with your health care provider.  

## 2014-06-10 LAB — CMP14+EGFR
ALT: 11 IU/L (ref 0–32)
AST: 14 IU/L (ref 0–40)
Albumin/Globulin Ratio: 1.5 (ref 1.1–2.5)
Albumin: 4.3 g/dL (ref 3.5–4.8)
Alkaline Phosphatase: 70 IU/L (ref 39–117)
BUN/Creatinine Ratio: 19 (ref 11–26)
BUN: 15 mg/dL (ref 8–27)
CO2: 26 mmol/L (ref 18–29)
Calcium: 10.1 mg/dL (ref 8.7–10.3)
Chloride: 95 mmol/L — ABNORMAL LOW (ref 97–108)
Creatinine, Ser: 0.77 mg/dL (ref 0.57–1.00)
GFR calc Af Amer: 89 mL/min/{1.73_m2} (ref >59)
GFR calc non Af Amer: 77 mL/min/{1.73_m2} (ref >59)
Globulin, Total: 2.8 g/dL (ref 1.5–4.5)
Glucose: 140 mg/dL — ABNORMAL HIGH (ref 65–99)
Potassium: 5 mmol/L (ref 3.5–5.2)
Sodium: 139 mmol/L (ref 134–144)
Total Bilirubin: 0.6 mg/dL (ref 0.0–1.2)
Total Protein: 7.1 g/dL (ref 6.0–8.5)

## 2014-06-10 LAB — NMR, LIPOPROFILE
Cholesterol: 204 mg/dL — ABNORMAL HIGH (ref 100–199)
HDL Cholesterol by NMR: 57 mg/dL (ref >39)
HDL Particle Number: 32.6 umol/L (ref >=30.5)
LDL Particle Number: 2004 nmol/L — ABNORMAL HIGH (ref <1000)
LDL Size: 20.6 nm (ref >20.5)
LDLC SERPL CALC-MCNC: 131 mg/dL — ABNORMAL HIGH (ref 0–99)
LP-IR Score: 25 (ref <=45)
Small LDL Particle Number: 869 nmol/L — ABNORMAL HIGH (ref <=527)
Triglycerides by NMR: 82 mg/dL (ref 0–149)

## 2014-06-10 LAB — THYROID PANEL WITH TSH
Free Thyroxine Index: 2.2 (ref 1.2–4.9)
T3 Uptake Ratio: 26 % (ref 24–39)
T4, Total: 8.6 ug/dL (ref 4.5–12.0)
TSH: 2.54 u[IU]/mL (ref 0.450–4.500)

## 2014-06-10 LAB — VITAMIN D 25 HYDROXY (VIT D DEFICIENCY, FRACTURES): Vit D, 25-Hydroxy: 24.5 ng/mL — ABNORMAL LOW (ref 30.0–100.0)

## 2014-06-11 LAB — PAP IG (IMAGE GUIDED): PAP Smear Comment: 0

## 2014-07-20 ENCOUNTER — Ambulatory Visit (INDEPENDENT_AMBULATORY_CARE_PROVIDER_SITE_OTHER): Payer: Medicare Other

## 2014-07-20 DIAGNOSIS — Z0289 Encounter for other administrative examinations: Secondary | ICD-10-CM

## 2014-07-20 DIAGNOSIS — Z23 Encounter for immunization: Secondary | ICD-10-CM

## 2014-07-27 ENCOUNTER — Ambulatory Visit (INDEPENDENT_AMBULATORY_CARE_PROVIDER_SITE_OTHER): Payer: Medicare Other | Admitting: Cardiology

## 2014-07-27 ENCOUNTER — Ambulatory Visit (HOSPITAL_BASED_OUTPATIENT_CLINIC_OR_DEPARTMENT_OTHER): Payer: Medicare Other | Admitting: Cardiology

## 2014-07-27 ENCOUNTER — Ambulatory Visit (HOSPITAL_COMMUNITY): Payer: Medicare Other | Attending: Cardiovascular Disease | Admitting: Cardiology

## 2014-07-27 DIAGNOSIS — I739 Peripheral vascular disease, unspecified: Secondary | ICD-10-CM

## 2014-07-27 DIAGNOSIS — E785 Hyperlipidemia, unspecified: Secondary | ICD-10-CM | POA: Diagnosis not present

## 2014-07-27 DIAGNOSIS — I6523 Occlusion and stenosis of bilateral carotid arteries: Secondary | ICD-10-CM

## 2014-07-27 DIAGNOSIS — I700 Atherosclerosis of aorta: Secondary | ICD-10-CM

## 2014-07-27 NOTE — Progress Notes (Signed)
Abdominal Aorta Duplex performed  

## 2014-07-27 NOTE — Progress Notes (Signed)
ABI performed  

## 2014-07-27 NOTE — Progress Notes (Signed)
Carotid performed  

## 2014-08-14 ENCOUNTER — Encounter: Payer: Self-pay | Admitting: Cardiovascular Disease

## 2014-08-14 ENCOUNTER — Ambulatory Visit (INDEPENDENT_AMBULATORY_CARE_PROVIDER_SITE_OTHER): Payer: Medicare Other | Admitting: Cardiovascular Disease

## 2014-08-14 VITALS — BP 138/90 | HR 85 | Ht 61.5 in | Wt 126.4 lb

## 2014-08-14 DIAGNOSIS — I059 Rheumatic mitral valve disease, unspecified: Secondary | ICD-10-CM

## 2014-08-14 DIAGNOSIS — I739 Peripheral vascular disease, unspecified: Secondary | ICD-10-CM

## 2014-08-14 DIAGNOSIS — E785 Hyperlipidemia, unspecified: Secondary | ICD-10-CM

## 2014-08-14 MED ORDER — EZETIMIBE 10 MG PO TABS: 10.0000 mg | ORAL_TABLET | Freq: Every day | ORAL | Status: DC

## 2014-08-14 MED ORDER — ROSUVASTATIN CALCIUM 10 MG PO TABS: 10.0000 mg | ORAL_TABLET | ORAL | Status: DC

## 2014-08-14 NOTE — Progress Notes (Signed)
Background: The patient is followed for asymptomatic carotid stenosis and lower extremity peripheral arterial disease. She is a long-time smoker. She's been intolerant to statin drugs because of myalgias.  HPI:  73 year old woman presenting for follow-up evaluation. She was last seen 1 year ago. The patient is doing well from a symptom at a perspective. She denies any symptoms of numbness, tingling, or weakness of her extremities. She's had no clumsiness, expressive aphasia, or amaurosis fugax. She's had no chest pain or shortness of breath. She denies leg claudication symptoms. She has noted a rash on her legs but has no other related symptoms.  Studies:  Lipid Panel     Component Value Date/Time   CHOL 204* 06/09/2014 1028   TRIG 82 06/09/2014 1028   TRIG 137 07/08/2013 0841   HDL 57 06/09/2014 1028   HDL 51 07/08/2013 0841   HDL 53 02/06/2013 0932   CHOLHDL 4.0 07/08/2013 0841   CHOLHDL 3.5 02/06/2013 0932   VLDL 20 02/06/2013 0932   LDLCALC 131* 06/09/2014 1028   LDLCALC 127* 07/08/2013 0841   LDLCALC 114* 02/06/2013 0932   ABIs: 1.0 on the right, 1.0 on the left  Aortoiliac duplex: Severe bilateral common iliac stenosis with peak velocities of 421 cm/s on the right and 457 cm/s on the left. This is stable from previous studies.  Carotid duplex: 40-59% right internal carotid artery stenosis, 60-79% left internal carotid artery stenosis with a peak velocity of 160/39. There is elevation of the right subclavian velocities and a 33 mm pressure gradient from the right and to the left with evidence of retrograde flow in the left vertebral and left subclavian steal.  Outpatient Encounter Prescriptions as of 08/14/2014  Medication Sig  . aspirin 81 MG tablet Take 81 mg by mouth daily.  . B Complex Vitamins (VITAMIN B COMPLEX PO) Take by mouth daily.  . Cholecalciferol 1000 UNITS tablet Take 1 tablet (1,000 Units total) by mouth daily.  . furosemide (LASIX) 20 MG tablet Take 1 tablet  (20 mg total) by mouth as needed.  Marland Kitchen glimepiride (AMARYL) 4 MG tablet Take 1 tablet (4 mg total) by mouth daily with breakfast. only Take one tablet daily after steroid shot  . ibuprofen (ADVIL,MOTRIN) 200 MG tablet Take 200 mg by mouth as needed.    . rosuvastatin (CRESTOR) 5 MG tablet Take 1 tablet (5 mg total) by mouth daily.    Allergies  Allergen Reactions  . Contrast Media [Iodinated Diagnostic Agents] Shortness Of Breath    Per pt  . Iohexol   . Metformin   . Rofecoxib     Past Medical History  Diagnosis Date  . Heart murmur   . Mitral valve prolapse   . Dyslipidemia   . Bladder cancer   . Pseudogout   . Diabetes mellitus     Type II  . PVD (peripheral vascular disease)   . Abdominal bruit   . Carotid bruit     family history includes Heart failure in her mother. There is no history of Coronary artery disease.   ROS: Negative except as per HPI  BP 138/90 mmHg  Pulse 85  Ht 5' 1.5" (1.562 m)  Wt 126 lb 6.4 oz (57.335 kg)  BMI 23.50 kg/m2  PHYSICAL EXAM: Pt is alert and oriented, NAD HEENT: normal Neck: JVP - normal, carotids 2+= with bilateral bruits Lungs: Diffuse rhonchi bilaterally CV: RRR with a soft systolic murmur at the left sternal border Abd: soft, NT, Positive BS, no hepatomegaly  Ext: no C/C/E, distal pulses intact and equal Skin: There is a rash of livido reticularis on the legs  EKG:  Normal sinus rhythm 85 bpm, septal infarct age undetermined, otherwise within normal limits.  ASSESSMENT AND PLAN: 1. Carotid artery disease without history of stroke. Will continue medical therapy with aspirin, low-dose Crestor as tolerated. Tobacco cessation counseling done. She has been counseled about symptoms of stroke/TIA. Will repeat a carotid duplex in one year.  2. Left subclavian stenosis. The patient is asymptomatic. She specifically denies dizziness, presyncope, or arm claudication symptoms.  3. Lower extremity peripheral arterial disease with severe  bilateral iliac artery stenosis. No claudication symptoms at present. Recommend repeat ABIs and duplex scan in one year.  4. Hyperlipidemia. Lipids reviewed. Will give her a trial of Crestor 5 mg 3 days per week and daily Zetia 10 mg. Repeat lipids in 3-4 months when she returns from [place].  5. Tobacco abuse. Cessation counseling done.  Sherren Mocha, MD 08/14/2014 8:33 AM

## 2014-08-14 NOTE — Patient Instructions (Addendum)
Your physician wants you to follow-up in: 1 YEAR with Dr Burt Knack.  You will receive a reminder letter in the mail two months in advance. If you don't receive a letter, please call our office to schedule the follow-up appointment.  Your physician has requested that you have a carotid duplex in 1 YEAR. This test is an ultrasound of the carotid arteries in your neck. It looks at blood flow through these arteries that supply the brain with blood. Allow one hour for this exam. There are no restrictions or special instructions.  Your physician has requested that you have an ankle brachial index (ABI) in 1 YEAR. During this test an ultrasound and blood pressure cuff are used to evaluate the arteries that supply the arms and legs with blood. Allow thirty minutes for this exam. There are no restrictions or special instructions.  Your physician has requested an aorto-iliac duplex in 1 YEAR.   Your physician recommends that you return for a FASTING LIPID and LIVER profile in 3 MONTHS--nothing to eat or drink after midnight. (Please have this performed at [facility])  Your physician has recommended you make the following change in your medication:  1. START Zetia 10mg  take one by mouth daily 2. START Crestor 10mg  take one-half tablet on Monday, Wednesday and Friday

## 2014-09-02 ENCOUNTER — Ambulatory Visit (INDEPENDENT_AMBULATORY_CARE_PROVIDER_SITE_OTHER): Payer: Medicare Other | Admitting: Pharmacist

## 2014-09-02 ENCOUNTER — Encounter: Payer: Self-pay | Admitting: Pharmacist

## 2014-09-02 ENCOUNTER — Ambulatory Visit (INDEPENDENT_AMBULATORY_CARE_PROVIDER_SITE_OTHER): Payer: Medicare Other

## 2014-09-02 VITALS — Ht 61.25 in | Wt 130.0 lb

## 2014-09-02 DIAGNOSIS — Z13820 Encounter for screening for osteoporosis: Secondary | ICD-10-CM

## 2014-09-02 DIAGNOSIS — M810 Age-related osteoporosis without current pathological fracture: Secondary | ICD-10-CM

## 2014-09-02 DIAGNOSIS — E785 Hyperlipidemia, unspecified: Secondary | ICD-10-CM

## 2014-09-02 LAB — HM DEXA SCAN

## 2014-09-02 NOTE — Progress Notes (Signed)
Patient ID: Hannah French, female   DOB: 15-Mar-1941, 73 y.o.   MRN: [id number] [facility] Current Height: Height: 5' 1.25" (155.6 cm)      Max Lifetime Height:  5\' 2"  Current Weight: Weight: 130 lb (58.968 kg)       Ethnicity:Caucasian    HPI: Does pt already have a diagnosis of:  Osteopenia?  Yes Osteoporosis?  No  Back Pain?  No       Kyphosis?  No Prior fracture?  Yes - vertebra in 1997 when she fell out of door are sisters;  Also bron ribs and wrist Med(s) for Osteoporosis/Osteopenia:  None Med(s) previously tried for Osteoporosis/Osteopenia:  Boniva - patient stopped due to nausea                                                             PMH: Age at menopause:  73yo - surgical Hysterectomy?  Yes Oophorectomy?  No HRT? Yes - Former.  Type/duration: estraderm patche for about 3-4 years Steroid Use?  No Thyroid med?  No History of cancer?  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  No Current or previous eating disorders?  No Last Vitamin D Result:  24.5 (06/09/2014) Last GFR Result:  77 (06/09/2014)   FH/SH: Family history of osteoporosis?  No Parent with history of hip fracture?  No Family history of breast cancer?  Yes - daughter Exercise?  Yes   Smoking?  Yes - DISCUSSED QUITTING  AND PATIENT IS NOT INTERESTED AT THIS TIME Alcohol?  No    Calcium Assessment Calcium Intake  # of servings/day  Calcium mg  Milk (8 oz) 0.5  x  300  = 150mg   Yogurt (4 oz) 1 x  200 = 200mg   Cheese (1 oz) 0 x  200 = 0  Other Calcium sources   250mg   Ca supplement 0 = 0   Estimated calcium intake per day 600mg   **past history of elevated calcium  DEXA Results Date of Test T-Score for AP Spine L1-L4 T-Score for Total Left Hip T-Score for Total Right Hip T-Score for Neck of Left Hip T-Score of Neck of Right Hip  09/02/2014 -2.4 -2.3 -2.3 -2.7 -2.5                        Assessment: osteoprosis with history of  fracture.  Recommendations: 1.  Discussed many treatment options.  Patient does not wish to try bisphosphonate - oral or infusion at the point.  She is also concerned about Forteo due to risk of bone cancer and she has had breast cancer.   Decided to check on coverage of Prolia -  Patient is leaving to go [place] until March 2016 so will probably not give first dose until she returns. 2.  recommend calcium 1200mg  daily through supplementation or diet.  3.  recommend weight bearing exercise - 30 minutes at least 4 days per week.   4.  Counseled and educated about fall risk and prevention.  Recheck DEXA:  1 year  Time spent counseling patient:  30 minutes   Cherre Robins, PharmD, CPP

## 2014-09-02 NOTE — Patient Instructions (Signed)

## 2014-10-08 DIAGNOSIS — E119 Type 2 diabetes mellitus without complications: Secondary | ICD-10-CM | POA: Diagnosis not present

## 2014-11-27 ENCOUNTER — Ambulatory Visit (INDEPENDENT_AMBULATORY_CARE_PROVIDER_SITE_OTHER): Payer: Medicare Other | Admitting: Family Medicine

## 2014-11-27 ENCOUNTER — Encounter: Payer: Self-pay | Admitting: Family Medicine

## 2014-11-27 VITALS — BP 119/73 | HR 114 | Temp 97.6°F | Ht 61.5 in | Wt 126.8 lb

## 2014-11-27 DIAGNOSIS — E119 Type 2 diabetes mellitus without complications: Secondary | ICD-10-CM

## 2014-11-27 DIAGNOSIS — L408 Other psoriasis: Secondary | ICD-10-CM | POA: Diagnosis not present

## 2014-11-27 DIAGNOSIS — J40 Bronchitis, not specified as acute or chronic: Secondary | ICD-10-CM | POA: Diagnosis not present

## 2014-11-27 DIAGNOSIS — L403 Pustulosis palmaris et plantaris: Secondary | ICD-10-CM

## 2014-11-27 DIAGNOSIS — J329 Chronic sinusitis, unspecified: Secondary | ICD-10-CM

## 2014-11-27 DIAGNOSIS — E785 Hyperlipidemia, unspecified: Secondary | ICD-10-CM

## 2014-11-27 LAB — POCT CBC
Granulocyte percent: 70 %G (ref 37–80)
HCT, POC: 46.2 % (ref 37.7–47.9)
Hemoglobin: 14.6 g/dL (ref 12.2–16.2)
Lymph, poc: 2.8 (ref 0.6–3.4)
MCH, POC: 30 pg (ref 27–31.2)
MCHC: 31.6 g/dL — AB (ref 31.8–35.4)
MCV: 95.2 fL (ref 80–97)
MPV: 7.8 fL (ref 0–99.8)
POC Granulocyte: 7.3 — AB (ref 2–6.9)
POC LYMPH PERCENT: 27 %L (ref 10–50)
Platelet Count, POC: 277 10*3/uL (ref 142–424)
RBC: 4.85 M/uL (ref 4.04–5.48)
RDW, POC: 13.2 %
WBC: 10.4 10*3/uL — AB (ref 4.6–10.2)

## 2014-11-27 LAB — POCT GLYCOSYLATED HEMOGLOBIN (HGB A1C): Hemoglobin A1C: 8.7

## 2014-11-27 MED ORDER — LEVOFLOXACIN 500 MG PO TABS: 500.0000 mg | ORAL_TABLET | Freq: Every day | ORAL | Status: DC

## 2014-11-27 MED ORDER — BETAMETHASONE SOD PHOS & ACET 6 (3-3) MG/ML IJ SUSP
6.0000 mg | Freq: Once | INTRAMUSCULAR | Status: AC
  Administered 2014-11-27: 6 mg via INTRAMUSCULAR

## 2014-11-27 MED ORDER — ACITRETIN 10 MG PO CAPS: 10.0000 mg | ORAL_CAPSULE | Freq: Every day | ORAL | Status: DC

## 2014-11-27 NOTE — Progress Notes (Signed)
Subjective:  Patient ID: Hannah French, female    DOB: 1941-04-18  Age: 74 y.o. MRN: [id number]  CC: URI and Rash   HPI Hannah French presents for Symptoms include congestion, facial pain, nasal congestion, no  fever,  productive cough, post nasal drip and sinus pressure with no fever, chills, night sweats or weight loss. Onset of symptoms was a few days ago, gradually worsening since that time. Pt.is drinking moderate amounts of fluids.    Follow-up diabetes  Patient in for follow-up of elevated cholesterol. Doing well without complaints on current medication. Denies side effects of statin including myalgia and arthralgia and nausea. Also in today for liver function testing. Currently no chest pain, shortness of breath or other cardiovascular related symptoms noted.  She has a chronic rash on the palms that she would like to have medicated. She has used some cream in the past that has not worked well. 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   She has past surgical history that includes Laparoscopic cholecystectomy (2009); Appendectomy; and Hysterectomy-type unspecified.   Her family history includes Heart failure in her mother. There is no history of Coronary artery disease.She reports that she has been smoking Cigarettes.  She has a 30 pack-year smoking history. She has never used smokeless tobacco. She reports that she does not drink alcohol or use illicit drugs.  Current Outpatient Prescriptions on File Prior to Visit  Medication Sig Dispense Refill  . B Complex Vitamins (VITAMIN B COMPLEX PO) Take by mouth daily.    . Cholecalciferol 1000 UNITS tablet Take 1 tablet (1,000 Units total) by mouth daily. 60 tablet 3  . furosemide (LASIX) 20 MG tablet  Take 1 tablet (20 mg total) by mouth as needed. 90 tablet 1  . glimepiride (AMARYL) 4 MG tablet Take 1 tablet (4 mg total) by mouth daily with breakfast. only Take one tablet daily after steroid shot 90 tablet 1  . ibuprofen (ADVIL,MOTRIN) 200 MG tablet Take 200 mg by mouth as needed.      Marland Kitchen aspirin 81 MG tablet Take 81 mg by mouth daily.    Marland Kitchen ezetimibe (ZETIA) 10 MG tablet Take 1 tablet (10 mg total) by mouth daily. (Patient not taking: Reported on 11/27/2014) 90 tablet 3  . rosuvastatin (CRESTOR) 10 MG tablet Take 1 tablet (10 mg total) by mouth as directed. (Patient not taking: Reported on 11/27/2014) 90 tablet 3   No current facility-administered medications on file prior to visit.    ROS Review of Systems  Constitutional: Negative for fever, chills, activity change and appetite change.  HENT: Positive for congestion, postnasal drip, rhinorrhea and sinus pressure. Negative for ear discharge, ear pain, hearing loss, nosebleeds, sneezing and trouble swallowing.   Respiratory: Positive for cough (productive) and chest tightness. Negative for shortness of breath.   Cardiovascular: Negative for chest pain and palpitations.  Skin: Negative for rash.    Objective:  BP 119/73 mmHg  Pulse 114  Temp(Src) 97.6 F (36.4 C) (Oral)  Ht 5' 1.5" (1.562 m)  Wt 126 lb 12.8 oz (57.516 kg)  BMI 23.57 kg/m2  BP Readings from Last 3 Encounters:  11/27/14 119/73  08/14/14 138/90  06/09/14 113/79    Wt Readings from Last 3 Encounters:  11/27/14 126 lb 12.8 oz (57.516 kg)  09/02/14 130 lb (  58.968 kg)  08/14/14 126 lb 6.4 oz (57.335 kg)     Physical Exam  Constitutional: She appears well-developed and well-nourished.  HENT:  Head: Normocephalic and atraumatic.  Right Ear: Tympanic membrane and external ear normal. No decreased hearing is noted.  Left Ear: Tympanic membrane and external ear normal. No decreased hearing is noted.  Nose: Mucosal edema present. Right sinus exhibits no frontal sinus  tenderness. Left sinus exhibits no frontal sinus tenderness.  Mouth/Throat: No oropharyngeal exudate or posterior oropharyngeal erythema.  Neck: No Brudzinski's sign noted.  Pulmonary/Chest: Breath sounds normal. No respiratory distress.  Lymphadenopathy:       Head (right side): No preauricular adenopathy present.       Head (left side): No preauricular adenopathy present.       Right cervical: No superficial cervical adenopathy present.      Left cervical: No superficial cervical adenopathy present.  Skin: Rash: pustular rash covers much of the palmar surface of both hands.    Lab Results  Component Value Date   HGBA1C 8.7 11/27/2014   HGBA1C 7.1 06/09/2014   HGBA1C 7.4% 12/29/2013    Lab Results  Component Value Date   WBC 10.4* 11/27/2014   HGB 14.6 11/27/2014   HCT 46.2 11/27/2014   PLT 296 11/29/2007   GLUCOSE 113* 11/27/2014   CHOL 206* 11/27/2014   TRIG 135 11/27/2014   HDL 56 11/27/2014   LDLCALC 123* 11/27/2014   ALT 12 11/27/2014   AST 17 11/27/2014   NA 141 11/27/2014   K 4.6 11/27/2014   CL 97 11/27/2014   CREATININE 0.75 11/27/2014   BUN 15 11/27/2014   CO2 26 11/27/2014   TSH 2.540 06/09/2014   HGBA1C 8.7 11/27/2014    Ct Head Wo Contrast  10/21/2013   CLINICAL DATA:  Posterior left side; headache; no known injury; pain and "knot" under left eye; hx bladder ca; diabetes; pvd  EXAM: CT HEAD WITHOUT CONTRAST  CT MAXILLOFACIAL WITHOUT CONTRAST  TECHNIQUE: Multidetector CT imaging of the head and maxillofacial structures were performed using the standard protocol without intravenous contrast. Multiplanar CT image reconstructions of the maxillofacial structures were also generated.  COMPARISON:  None.  FINDINGS: CT HEAD FINDINGS  There is no evidence of acute hemorrhage. There is no evidence of extra-axial fluid collections. Mild diffuse cortical atrophy is appreciated. Basal ganglial calcifications are identified. Mild areas of low attenuation project within the  subcortical and periventricular white matter regions. A focal wedge-shaped area of low attenuation projects along the periphery of the left cerebellar hemisphere. There is no evidence of a depressed skull fracture. Mild mucosal thickening is appreciated within the ethmoid air cells. Paranasal sinuses and mastoid air cells are otherwise unremarkable.  CT MAXILLOFACIAL FINDINGS  There is no evidence of fracture, dislocation or malalignment. Mild mucosal thickening is appreciated within the ethmoid air cells otherwise the paranasal sinuses are patent. The mastoid air cells are unremarkable. The temporomandibular joints and mandible are unremarkable.  IMPRESSION: 1. Chronic and involutional changes without evidence of focal or acute intracranial abnormalities 2. Findings which may reflect mild sinus disease within the ethmoid air cells. Maxillofacial evaluation is otherwise unremarkable.   Electronically Signed   By: Margaree Mackintosh M.D.   On: 10/21/2013 18:33   Ct Maxillofacial Wo Cm  10/21/2013   CLINICAL DATA:  Posterior left side; headache; no known injury; pain and "knot" under left eye; hx bladder ca; diabetes; pvd  EXAM: CT HEAD WITHOUT CONTRAST  CT MAXILLOFACIAL WITHOUT CONTRAST  TECHNIQUE: Multidetector CT imaging of the head and maxillofacial structures were performed using the standard protocol without intravenous contrast. Multiplanar CT image reconstructions of the maxillofacial structures were also generated.  COMPARISON:  None.  FINDINGS: CT HEAD FINDINGS  There is no evidence of acute hemorrhage. There is no evidence of extra-axial fluid collections. Mild diffuse cortical atrophy is appreciated. Basal ganglial calcifications are identified. Mild areas of low attenuation project within the subcortical and periventricular white matter regions. A focal wedge-shaped area of low attenuation projects along the periphery of the left cerebellar hemisphere. There is no evidence of a depressed skull fracture.  Mild mucosal thickening is appreciated within the ethmoid air cells. Paranasal sinuses and mastoid air cells are otherwise unremarkable.  CT MAXILLOFACIAL FINDINGS  There is no evidence of fracture, dislocation or malalignment. Mild mucosal thickening is appreciated within the ethmoid air cells otherwise the paranasal sinuses are patent. The mastoid air cells are unremarkable. The temporomandibular joints and mandible are unremarkable.  IMPRESSION: 1. Chronic and involutional changes without evidence of focal or acute intracranial abnormalities 2. Findings which may reflect mild sinus disease within the ethmoid air cells. Maxillofacial evaluation is otherwise unremarkable.   Electronically Signed   By: Margaree Mackintosh M.D.   On: 10/21/2013 18:33    Assessment & Plan:   Hannah French was seen today for uri and rash.  Diagnoses and all orders for this visit:  Pustulosis palmaris et plantaris Orders: -     POCT CBC -     CMP14+EGFR  Hyperlipidemia with target LDL less than 70 Orders: -     POCT CBC -     CMP14+EGFR -     Lipid panel  Type 2 diabetes mellitus without complication Orders: -     POCT CBC -     CMP14+EGFR -     POCT glycosylated hemoglobin (Hb A1C)  Sinobronchitis Orders: -     POCT CBC -     CMP14+EGFR -     betamethasone acetate-betamethasone sodium phosphate (CELESTONE) injection 6 mg; Inject 1 mL (6 mg total) into the muscle once.  Other orders -     acitretin (SORIATANE) 10 MG capsule; Take 1 capsule (10 mg total) by mouth daily before breakfast. -     levofloxacin (LEVAQUIN) 500 MG tablet; Take 1 tablet (500 mg total) by mouth daily.   I am having Ms. Hannah French start on acitretin and levofloxacin. I am also having her maintain her ibuprofen, B Complex Vitamins (VITAMIN B COMPLEX PO), aspirin, Cholecalciferol, glimepiride, furosemide, ezetimibe, and rosuvastatin. We administered betamethasone acetate-betamethasone sodium phosphate.  Meds ordered this encounter  Medications   . acitretin (SORIATANE) 10 MG capsule    Sig: Take 1 capsule (10 mg total) by mouth daily before breakfast.    Dispense:  90 capsule    Refill:  3  . levofloxacin (LEVAQUIN) 500 MG tablet    Sig: Take 1 tablet (500 mg total) by mouth daily.    Dispense:  10 tablet    Refill:  0  . betamethasone acetate-betamethasone sodium phosphate (CELESTONE) injection 6 mg    Sig:      Follow-up: Return in about 2 weeks (around 12/11/2014).  Claretta Fraise, M.D.

## 2014-11-28 LAB — CMP14+EGFR
ALT: 12 IU/L (ref 0–32)
AST: 17 IU/L (ref 0–40)
Albumin/Globulin Ratio: 1.5 (ref 1.1–2.5)
Albumin: 4.4 g/dL (ref 3.5–4.8)
Alkaline Phosphatase: 68 IU/L (ref 39–117)
BUN/Creatinine Ratio: 20 (ref 11–26)
BUN: 15 mg/dL (ref 8–27)
Bilirubin Total: 0.7 mg/dL (ref 0.0–1.2)
CO2: 26 mmol/L (ref 18–29)
Calcium: 10.1 mg/dL (ref 8.7–10.3)
Chloride: 97 mmol/L (ref 97–108)
Creatinine, Ser: 0.75 mg/dL (ref 0.57–1.00)
GFR calc Af Amer: 91 mL/min/{1.73_m2} (ref >59)
GFR calc non Af Amer: 79 mL/min/{1.73_m2} (ref >59)
Globulin, Total: 2.9 g/dL (ref 1.5–4.5)
Glucose: 113 mg/dL — ABNORMAL HIGH (ref 65–99)
Potassium: 4.6 mmol/L (ref 3.5–5.2)
Sodium: 141 mmol/L (ref 134–144)
Total Protein: 7.3 g/dL (ref 6.0–8.5)

## 2014-11-28 LAB — LIPID PANEL
Chol/HDL Ratio: 3.7 ratio units (ref 0.0–4.4)
Cholesterol, Total: 206 mg/dL — ABNORMAL HIGH (ref 100–199)
HDL: 56 mg/dL (ref >39)
LDL Calculated: 123 mg/dL — ABNORMAL HIGH (ref 0–99)
Triglycerides: 135 mg/dL (ref 0–149)
VLDL Cholesterol Cal: 27 mg/dL (ref 5–40)

## 2014-11-30 ENCOUNTER — Other Ambulatory Visit: Payer: Self-pay | Admitting: *Deleted

## 2014-11-30 MED ORDER — BETAMETHASONE DIPROPIONATE 0.05 % EX CREA: TOPICAL_CREAM | Freq: Two times a day (BID) | CUTANEOUS | Status: DC

## 2014-11-30 NOTE — Telephone Encounter (Signed)
Requesting refill on Betamethasone cream. The prescription has been setup if you want to approve it.

## 2014-12-21 ENCOUNTER — Encounter: Payer: Self-pay | Admitting: Family Medicine

## 2014-12-21 ENCOUNTER — Ambulatory Visit (INDEPENDENT_AMBULATORY_CARE_PROVIDER_SITE_OTHER): Payer: Medicare Other | Admitting: Family Medicine

## 2014-12-21 VITALS — BP 127/79 | HR 102 | Temp 97.6°F | Ht 61.5 in | Wt 126.4 lb

## 2014-12-21 DIAGNOSIS — E785 Hyperlipidemia, unspecified: Secondary | ICD-10-CM

## 2014-12-21 DIAGNOSIS — J302 Other seasonal allergic rhinitis: Secondary | ICD-10-CM

## 2014-12-21 DIAGNOSIS — H6123 Impacted cerumen, bilateral: Secondary | ICD-10-CM

## 2014-12-21 DIAGNOSIS — E559 Vitamin D deficiency, unspecified: Secondary | ICD-10-CM | POA: Diagnosis not present

## 2014-12-21 DIAGNOSIS — E119 Type 2 diabetes mellitus without complications: Secondary | ICD-10-CM | POA: Diagnosis not present

## 2014-12-21 MED ORDER — FEXOFENADINE-PSEUDOEPHED ER 180-240 MG PO TB24: 1.0000 | ORAL_TABLET | Freq: Every day | ORAL | Status: DC

## 2014-12-21 MED ORDER — CARBAMIDE PEROXIDE 6.5 % OT SOLN: OTIC | Status: DC

## 2014-12-21 NOTE — Progress Notes (Signed)
Subjective:  Patient ID: Hannah French, female    DOB: 12-08-1940  Age: 74 y.o. MRN: [id number]  CC: URI   HPI DINORAH MASULLO presents for patient states that symptoms returned somewhat this past weekend that would be 3 days ago. There is some clear runny nose and the head is congested some cough which seems like it's in the upper chest only there have been no fever no chills no sweats and no productivity from the cough. She states that the shot she took last time ran her sugar up fairly high into the 250+ range. But it came back down after several days. 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 He does have occasional cerumen impaction and would like to be checked for that as well. She is having some allergic symptoms such as snow for some clear rhinorrhea posterior drainage sneezing and occasional cough.  Patient in for follow-up of elevated cholesterol. Doing well without complaints on current medication. Denies side effects of statin including myalgia and arthralgia and nausea. Also in today for liver function testing. Currently no chest pain, shortness of breath or other cardiovascular related symptoms noted.  Patient also has had a vitamin D deficiency and is concerned about osteoporosis she is in today also to have her vitamin D checked due to that.  Discussed DM diet  History [name] has a past medical history of Heart murmur; Mitral valve prolapse; Dyslipidemia; Bladder cancer; Pseudogout; Diabetes mellitus; PVD (peripheral vascular disease); Abdominal bruit; Carotid bruit; and Osteoporosis.   She has past surgical history that includes Laparoscopic cholecystectomy (2009); Appendectomy; and Hysterectomy-type unspecified.   Her family history includes Heart failure in her mother. There is no history of Coronary artery disease.She reports that she has been smoking Cigarettes.  She  has a 30 pack-year smoking history. She has never used smokeless tobacco. She reports that she does not drink alcohol or use illicit drugs.  Current Outpatient Prescriptions on File Prior to Visit  Medication Sig Dispense Refill  . acitretin (SORIATANE) 10 MG capsule Take 1 capsule (10 mg total) by mouth daily before breakfast. 90 capsule 3  . aspirin 81 MG tablet Take 81 mg by mouth daily.    . B Complex Vitamins (VITAMIN B COMPLEX PO) Take by mouth daily.    . betamethasone dipropionate (DIPROLENE) 0.05 % cream Apply topically 2 (two) times daily. 45 g 0  . Cholecalciferol 1000 UNITS tablet Take 1 tablet (1,000 Units total) by mouth daily. 60 tablet 3  . ezetimibe (ZETIA) 10 MG tablet Take 1 tablet (10 mg total) by mouth daily. 90 tablet 3  . furosemide (LASIX) 20 MG tablet Take 1 tablet (20 mg total) by mouth as needed. 90 tablet 1  . glimepiride (AMARYL) 4 MG tablet Take 1 tablet (4 mg total) by mouth daily with breakfast. only Take one tablet daily after steroid shot 90 tablet 1  . ibuprofen (ADVIL,MOTRIN) 200 MG tablet Take 200 mg by mouth as needed.      . rosuvastatin (CRESTOR) 10 MG tablet Take 1 tablet (10 mg total) by mouth as directed. 90 tablet 3   No current facility-administered medications on file prior to visit.    ROS Review of Systems  Constitutional: Negative for fever, chills, diaphoresis, appetite change, fatigue and unexpected weight change.  HENT: Positive for hearing loss, postnasal drip, rhinorrhea and sneezing. Negative  for congestion, ear discharge, ear pain, sore throat and trouble swallowing.   Eyes: Negative for pain.  Respiratory: Positive for cough. Negative for chest tightness and shortness of breath.   Cardiovascular: Negative for chest pain and palpitations.  Gastrointestinal: Negative for nausea, vomiting, abdominal pain, diarrhea and constipation.  Genitourinary: Negative for dysuria, frequency and menstrual problem.  Skin: Negative for rash.    Neurological: Negative for dizziness, weakness, numbness and headaches.  Psychiatric/Behavioral: Negative for dysphoric mood and agitation.    Objective:  BP 127/79 mmHg  Pulse 102  Temp(Src) 97.6 F (36.4 C) (Oral)  Ht 5' 1.5" (1.562 m)  Wt 126 lb 6.4 oz (57.335 kg)  BMI 23.50 kg/m2  BP Readings from Last 3 Encounters:  12/21/14 127/79  11/27/14 119/73  08/14/14 138/90    Wt Readings from Last 3 Encounters:  12/21/14 126 lb 6.4 oz (57.335 kg)  11/27/14 126 lb 12.8 oz (57.516 kg)  09/02/14 130 lb (58.968 kg)     Physical Exam  Constitutional: She is oriented to person, place, and time. She appears well-developed and well-nourished. No distress.  HENT:  Head: Normocephalic and atraumatic.  Right Ear: External ear normal. No swelling or tenderness. Decreased hearing is noted.  Left Ear: External ear normal. No swelling or tenderness. Decreased hearing is noted.  Nose: Nose normal.  Mouth/Throat: Oropharynx is clear and moist.  Bilateral cerumen impaction removed via lavage and revealed normal TMs bilaterally.  Eyes: Conjunctivae and EOM are normal. Pupils are equal, round, and reactive to light.  Neck: Normal range of motion. Neck supple. No thyromegaly present.  Cardiovascular: Normal rate, regular rhythm and normal heart sounds.   No murmur heard. Pulmonary/Chest: Effort normal and breath sounds normal. No respiratory distress. She has no wheezes. She has no rales.  Abdominal: Soft. Bowel sounds are normal. She exhibits no distension. There is no tenderness.  Lymphadenopathy:    She has no cervical adenopathy.  Neurological: She is alert and oriented to person, place, and time. She has normal reflexes.  Skin: Skin is warm and dry.  Psychiatric: She has a normal mood and affect. Her behavior is normal. Judgment and thought content normal.    Lab Results  Component Value Date   HGBA1C 8.7 11/27/2014   HGBA1C 7.1 06/09/2014   HGBA1C 7.4% 12/29/2013    Lab Results   Component Value Date   WBC 10.4* 11/27/2014   HGB 14.6 11/27/2014   HCT 46.2 11/27/2014   PLT 296 11/29/2007   GLUCOSE 113* 11/27/2014   CHOL 206* 11/27/2014   TRIG 135 11/27/2014   HDL 56 11/27/2014   LDLCALC 123* 11/27/2014   ALT 12 11/27/2014   AST 17 11/27/2014   NA 141 11/27/2014   K 4.6 11/27/2014   CL 97 11/27/2014   CREATININE 0.75 11/27/2014   BUN 15 11/27/2014   CO2 26 11/27/2014   TSH 2.540 06/09/2014   HGBA1C 8.7 11/27/2014    Ct Head Wo Contrast  10/21/2013   CLINICAL DATA:  Posterior left side; headache; no known injury; pain and "knot" under left eye; hx bladder ca; diabetes; pvd  EXAM: CT HEAD WITHOUT CONTRAST  CT MAXILLOFACIAL WITHOUT CONTRAST  TECHNIQUE: Multidetector CT imaging of the head and maxillofacial structures were performed using the standard protocol without intravenous contrast. Multiplanar CT image reconstructions of the maxillofacial structures were also generated.  COMPARISON:  None.  FINDINGS: CT HEAD FINDINGS  There is no evidence of acute hemorrhage. There is no evidence of extra-axial fluid collections.  Mild diffuse cortical atrophy is appreciated. Basal ganglial calcifications are identified. Mild areas of low attenuation project within the subcortical and periventricular white matter regions. A focal wedge-shaped area of low attenuation projects along the periphery of the left cerebellar hemisphere. There is no evidence of a depressed skull fracture. Mild mucosal thickening is appreciated within the ethmoid air cells. Paranasal sinuses and mastoid air cells are otherwise unremarkable.  CT MAXILLOFACIAL FINDINGS  There is no evidence of fracture, dislocation or malalignment. Mild mucosal thickening is appreciated within the ethmoid air cells otherwise the paranasal sinuses are patent. The mastoid air cells are unremarkable. The temporomandibular joints and mandible are unremarkable.  IMPRESSION: 1. Chronic and involutional changes without evidence of  focal or acute intracranial abnormalities 2. Findings which may reflect mild sinus disease within the ethmoid air cells. Maxillofacial evaluation is otherwise unremarkable.   Electronically Signed   By: Margaree Mackintosh M.D.   On: 10/21/2013 18:33   Ct Maxillofacial Wo Cm  10/21/2013   CLINICAL DATA:  Posterior left side; headache; no known injury; pain and "knot" under left eye; hx bladder ca; diabetes; pvd  EXAM: CT HEAD WITHOUT CONTRAST  CT MAXILLOFACIAL WITHOUT CONTRAST  TECHNIQUE: Multidetector CT imaging of the head and maxillofacial structures were performed using the standard protocol without intravenous contrast. Multiplanar CT image reconstructions of the maxillofacial structures were also generated.  COMPARISON:  None.  FINDINGS: CT HEAD FINDINGS  There is no evidence of acute hemorrhage. There is no evidence of extra-axial fluid collections. Mild diffuse cortical atrophy is appreciated. Basal ganglial calcifications are identified. Mild areas of low attenuation project within the subcortical and periventricular white matter regions. A focal wedge-shaped area of low attenuation projects along the periphery of the left cerebellar hemisphere. There is no evidence of a depressed skull fracture. Mild mucosal thickening is appreciated within the ethmoid air cells. Paranasal sinuses and mastoid air cells are otherwise unremarkable.  CT MAXILLOFACIAL FINDINGS  There is no evidence of fracture, dislocation or malalignment. Mild mucosal thickening is appreciated within the ethmoid air cells otherwise the paranasal sinuses are patent. The mastoid air cells are unremarkable. The temporomandibular joints and mandible are unremarkable.  IMPRESSION: 1. Chronic and involutional changes without evidence of focal or acute intracranial abnormalities 2. Findings which may reflect mild sinus disease within the ethmoid air cells. Maxillofacial evaluation is otherwise unremarkable.   Electronically Signed   By: Margaree Mackintosh  M.D.   On: 10/21/2013 18:33    Assessment & Plan:   [name] was seen today for uri.  Diagnoses and all orders for this visit:  Cerumen impaction, bilateral Orders: -     Ear wax removal  Other seasonal allergic rhinitis  Hyperlipidemia with target LDL less than 70  Vitamin D deficiency  Type 2 diabetes mellitus without complication  Other orders -     carbamide peroxide (DEBROX) 6.5 % otic solution; Use once daily in each ear. About 5 drops in one year while laying with that side. After about 10 minutes turned over to the other side and put 5 drops in that ear and lay with that ear for about 10 minutes. Daily for 10 days then weekly -     fexofenadine-pseudoephedrine (ALLEGRA-D ALLERGY & CONGESTION) 180-240 MG per 24 hr tablet; Take 1 tablet by mouth daily. In the evening  I have discontinued Ms. Cureton's levofloxacin. I am also having her start on carbamide peroxide and fexofenadine-pseudoephedrine. Additionally, I am having her maintain her ibuprofen, B Complex Vitamins (VITAMIN  B COMPLEX PO), aspirin, Cholecalciferol, glimepiride, furosemide, ezetimibe, rosuvastatin, acitretin, and betamethasone dipropionate.  Meds ordered this encounter  Medications  . carbamide peroxide (DEBROX) 6.5 % otic solution    Sig: Use once daily in each ear. About 5 drops in one year while laying with that side. After about 10 minutes turned over to the other side and put 5 drops in that ear and lay with that ear for about 10 minutes. Daily for 10 days then weekly    Dispense:  15 mL    Refill:  11  . fexofenadine-pseudoephedrine (ALLEGRA-D ALLERGY & CONGESTION) 180-240 MG per 24 hr tablet    Sig: Take 1 tablet by mouth daily. In the evening    Dispense:  30 tablet    Refill:  11     Follow-up: No Follow-up on file.  Claretta Fraise, M.D.

## 2014-12-25 ENCOUNTER — Encounter: Payer: Self-pay | Admitting: Family Medicine

## 2014-12-25 ENCOUNTER — Ambulatory Visit (INDEPENDENT_AMBULATORY_CARE_PROVIDER_SITE_OTHER): Payer: Medicare Other | Admitting: Family Medicine

## 2014-12-25 VITALS — BP 105/72 | HR 109 | Temp 97.0°F | Ht 61.5 in | Wt 126.6 lb

## 2014-12-25 DIAGNOSIS — T63301A Toxic effect of unspecified spider venom, accidental (unintentional), initial encounter: Secondary | ICD-10-CM | POA: Diagnosis not present

## 2014-12-25 DIAGNOSIS — M25562 Pain in left knee: Secondary | ICD-10-CM

## 2014-12-25 DIAGNOSIS — M1180 Other specified crystal arthropathies, unspecified site: Secondary | ICD-10-CM

## 2014-12-25 MED ORDER — DAPSONE 25 MG PO TABS: 75.0000 mg | ORAL_TABLET | Freq: Every day | ORAL | Status: DC

## 2014-12-25 NOTE — Progress Notes (Signed)
Subjective:  Patient ID: Hannah French, female    DOB: 1941/05/21  Age: 74 y.o. MRN: [id number]  CC: Knee Pain   HPI Hannah French presents for increasing pain in the left knee. There is some in the right but it is manageable the left has become severe area this has been increasing over the last week. She has a diagnosis of calcium pyrophosphate deposition disease. She has had multiple knee injections in the past. She has seen an orthopedist at the past for this at this time she would like to try an injection here because it so much closer to home and more convenient and it is beginning to be difficult for her to travel to [facility] to see the orthopedist.. Pain in the left knee is 6-8/10. It interferes with ambulation and ability to perform ADLs due to her.   lesion R buttocks X 4-5 days. She didn't notice it until she was standing in the shower several mornings ago and felt a rather severe stinging in the area. It is not pruritic it is somewhat tender but not painful unless pressure is placed on it. She has had no fever chills or sweats.  History Hannah French has a past medical history of Heart murmur; Mitral valve prolapse; Dyslipidemia; Bladder cancer; Pseudogout; Diabetes mellitus; PVD (peripheral vascular disease); Abdominal bruit; Carotid bruit; and Osteoporosis.   She has past surgical history that includes Laparoscopic cholecystectomy (2009); Appendectomy; and Hysterectomy-type unspecified.   Her family history includes Heart failure in her mother. There is no history of Coronary artery disease.She reports that she has been smoking Cigarettes.  She has a 30 pack-year smoking history. She has never used smokeless tobacco. She reports that she does not drink alcohol or use illicit drugs.  Current Outpatient Prescriptions on File Prior to Visit  Medication Sig Dispense Refill  . acitretin (SORIATANE) 10 MG capsule Take 1 capsule (10 mg total) by mouth daily before breakfast. 90 capsule 3  .  aspirin 81 MG tablet Take 81 mg by mouth daily.    . B Complex Vitamins (VITAMIN B COMPLEX PO) Take by mouth daily.    . betamethasone dipropionate (DIPROLENE) 0.05 % cream Apply topically 2 (two) times daily. 45 g 0  . carbamide peroxide (DEBROX) 6.5 % otic solution Use once daily in each ear. About 5 drops in one year while laying with that side. After about 10 minutes turned over to the other side and put 5 drops in that ear and lay with that ear for about 10 minutes. Daily for 10 days then weekly 15 mL 11  . Cholecalciferol 1000 UNITS tablet Take 1 tablet (1,000 Units total) by mouth daily. 60 tablet 3  . fexofenadine-pseudoephedrine (ALLEGRA-D ALLERGY & CONGESTION) 180-240 MG per 24 hr tablet Take 1 tablet by mouth daily. In the evening 30 tablet 11  . furosemide (LASIX) 20 MG tablet Take 1 tablet (20 mg total) by mouth as needed. 90 tablet 1  . glimepiride (AMARYL) 4 MG tablet Take 1 tablet (4 mg total) by mouth daily with breakfast. only Take one tablet daily after steroid shot 90 tablet 1  . ibuprofen (ADVIL,MOTRIN) 200 MG tablet Take 200 mg by mouth as needed.       No current facility-administered medications on file prior to visit.    ROS Review of Systems  Constitutional: Negative for fever, chills, diaphoresis, appetite change, fatigue and unexpected weight change.  HENT: Negative for congestion, ear pain, hearing loss, postnasal drip, rhinorrhea, sneezing, sore  throat and trouble swallowing.   Eyes: Negative for pain.  Respiratory: Negative for cough, chest tightness and shortness of breath.   Cardiovascular: Negative for chest pain and palpitations.  Gastrointestinal: Negative for nausea, vomiting, abdominal pain, diarrhea and constipation.  Genitourinary: Negative for dysuria, frequency and menstrual problem.  Musculoskeletal: Positive for joint swelling and arthralgias (both knees).  Skin: Negative for rash.  Neurological: Negative for dizziness, weakness, numbness and  headaches.  Psychiatric/Behavioral: Negative for dysphoric mood and agitation.    Objective:  BP 105/72 mmHg  Pulse 109  Temp(Src) 97 F (36.1 C) (Oral)  Ht 5' 1.5" (1.562 m)  Wt 126 lb 9.6 oz (57.425 kg)  BMI 23.54 kg/m2  BP Readings from Last 3 Encounters:  12/25/14 105/72  12/21/14 127/79  11/27/14 119/73    Wt Readings from Last 3 Encounters:  12/25/14 126 lb 9.6 oz (57.425 kg)  12/21/14 126 lb 6.4 oz (57.335 kg)  11/27/14 126 lb 12.8 oz (57.516 kg)     Physical Exam  Constitutional: She is oriented to person, place, and time. She appears well-developed and well-nourished. No distress.  HENT:  Head: Normocephalic and atraumatic.  Right Ear: External ear normal.  Left Ear: External ear normal.  Nose: Nose normal.  Mouth/Throat: Oropharynx is clear and moist.  Eyes: Conjunctivae and EOM are normal. Pupils are equal, round, and reactive to light.  Neck: Normal range of motion. Neck supple. No thyromegaly present.  Cardiovascular: Normal rate, regular rhythm and normal heart sounds.   No murmur heard. Pulmonary/Chest: Effort normal and breath sounds normal. No respiratory distress. She has no wheezes. She has no rales.  Abdominal: Soft. Bowel sounds are normal. She exhibits no distension. There is no tenderness.  Lymphadenopathy:    She has no cervical adenopathy.  Neurological: She is alert and oriented to person, place, and time. She has normal reflexes.  Skin: Skin is warm and dry.  There is a 1.5 cm erythematous ring around an 8 mm crusted ulceration at the superior lateral aspect of the right buttocks. This resembles a spider bite versus possible MRSA. The lesion has no fluctuance or pustulant appearance  Psychiatric: She has a normal mood and affect. Her behavior is normal. Judgment and thought content normal.    Lab Results  Component Value Date   HGBA1C 8.7 11/27/2014   HGBA1C 7.1 06/09/2014   HGBA1C 7.4% 12/29/2013    Lab Results  Component Value Date     WBC 10.4* 11/27/2014   HGB 14.6 11/27/2014   HCT 46.2 11/27/2014   PLT 296 11/29/2007   GLUCOSE 113* 11/27/2014   CHOL 206* 11/27/2014   TRIG 135 11/27/2014   HDL 56 11/27/2014   LDLCALC 123* 11/27/2014   ALT 12 11/27/2014   AST 17 11/27/2014   NA 141 11/27/2014   K 4.6 11/27/2014   CL 97 11/27/2014   CREATININE 0.75 11/27/2014   BUN 15 11/27/2014   CO2 26 11/27/2014   TSH 2.540 06/09/2014   HGBA1C 8.7 11/27/2014    Ct Head Wo Contrast  10/21/2013   CLINICAL DATA:  Posterior left side; headache; no known injury; pain and "knot" under left eye; hx bladder ca; diabetes; pvd  EXAM: CT HEAD WITHOUT CONTRAST  CT MAXILLOFACIAL WITHOUT CONTRAST  TECHNIQUE: Multidetector CT imaging of the head and maxillofacial structures were performed using the standard protocol without intravenous contrast. Multiplanar CT image reconstructions of the maxillofacial structures were also generated.  COMPARISON:  None.  FINDINGS: CT HEAD FINDINGS  There  is no evidence of acute hemorrhage. There is no evidence of extra-axial fluid collections. Mild diffuse cortical atrophy is appreciated. Basal ganglial calcifications are identified. Mild areas of low attenuation project within the subcortical and periventricular white matter regions. A focal wedge-shaped area of low attenuation projects along the periphery of the left cerebellar hemisphere. There is no evidence of a depressed skull fracture. Mild mucosal thickening is appreciated within the ethmoid air cells. Paranasal sinuses and mastoid air cells are otherwise unremarkable.  CT MAXILLOFACIAL FINDINGS  There is no evidence of fracture, dislocation or malalignment. Mild mucosal thickening is appreciated within the ethmoid air cells otherwise the paranasal sinuses are patent. The mastoid air cells are unremarkable. The temporomandibular joints and mandible are unremarkable.  IMPRESSION: 1. Chronic and involutional changes without evidence of focal or acute  intracranial abnormalities 2. Findings which may reflect mild sinus disease within the ethmoid air cells. Maxillofacial evaluation is otherwise unremarkable.   Electronically Signed   By: Margaree Mackintosh M.D.   On: 10/21/2013 18:33   Ct Maxillofacial Wo Cm  10/21/2013   CLINICAL DATA:  Posterior left side; headache; no known injury; pain and "knot" under left eye; hx bladder ca; diabetes; pvd  EXAM: CT HEAD WITHOUT CONTRAST  CT MAXILLOFACIAL WITHOUT CONTRAST  TECHNIQUE: Multidetector CT imaging of the head and maxillofacial structures were performed using the standard protocol without intravenous contrast. Multiplanar CT image reconstructions of the maxillofacial structures were also generated.  COMPARISON:  None.  FINDINGS: CT HEAD FINDINGS  There is no evidence of acute hemorrhage. There is no evidence of extra-axial fluid collections. Mild diffuse cortical atrophy is appreciated. Basal ganglial calcifications are identified. Mild areas of low attenuation project within the subcortical and periventricular white matter regions. A focal wedge-shaped area of low attenuation projects along the periphery of the left cerebellar hemisphere. There is no evidence of a depressed skull fracture. Mild mucosal thickening is appreciated within the ethmoid air cells. Paranasal sinuses and mastoid air cells are otherwise unremarkable.  CT MAXILLOFACIAL FINDINGS  There is no evidence of fracture, dislocation or malalignment. Mild mucosal thickening is appreciated within the ethmoid air cells otherwise the paranasal sinuses are patent. The mastoid air cells are unremarkable. The temporomandibular joints and mandible are unremarkable.  IMPRESSION: 1. Chronic and involutional changes without evidence of focal or acute intracranial abnormalities 2. Findings which may reflect mild sinus disease within the ethmoid air cells. Maxillofacial evaluation is otherwise unremarkable.   Electronically Signed   By: Margaree Mackintosh M.D.   On:  10/21/2013 18:33    Assessment & Plan:   Hannah French was seen today for knee pain.  Diagnoses and all orders for this visit:  Spider bite allergy, current reaction, accidental or unintentional, initial encounter  Calcium pyrophosphate crystal arthritis  Other orders -     dapsone 25 MG tablet; Take 3 tablets (75 mg total) by mouth daily.   I have discontinued Ms. Sottile's ezetimibe and rosuvastatin. I am also having her start on dapsone. Additionally, I am having her maintain her ibuprofen, B Complex Vitamins (VITAMIN B COMPLEX PO), aspirin, Cholecalciferol, glimepiride, furosemide, acitretin, betamethasone dipropionate, carbamide peroxide, and fexofenadine-pseudoephedrine.  Meds ordered this encounter  Medications  . dapsone 25 MG tablet    Sig: Take 3 tablets (75 mg total) by mouth daily.    Dispense:  21 tablet    Refill:  0     Follow-up: No Follow-up on file.  Claretta Fraise, M.D.

## 2015-01-01 ENCOUNTER — Encounter: Payer: Self-pay | Admitting: Family Medicine

## 2015-01-01 ENCOUNTER — Ambulatory Visit (INDEPENDENT_AMBULATORY_CARE_PROVIDER_SITE_OTHER): Payer: Medicare Other | Admitting: Family Medicine

## 2015-01-01 VITALS — BP 105/66 | HR 113 | Temp 97.6°F | Ht 61.5 in | Wt 127.6 lb

## 2015-01-01 DIAGNOSIS — T63301A Toxic effect of unspecified spider venom, accidental (unintentional), initial encounter: Secondary | ICD-10-CM | POA: Diagnosis not present

## 2015-01-01 MED ORDER — FLUTICASONE-SALMETEROL 250-50 MCG/DOSE IN AEPB: 1.0000 | INHALATION_SPRAY | Freq: Two times a day (BID) | RESPIRATORY_TRACT | Status: DC

## 2015-01-03 NOTE — Progress Notes (Signed)
Subjective:  Patient ID: Hannah French, female    DOB: 30-Aug-1941  Age: 74 y.o. MRN: [id number]  CC: Insect Bite   HPI Hannah French presents for recheck of lesion on her right buttocks. It had been felt to be a spider bite. She was started on dapsone. She is taking that regularly as we reviewed. She denies any side effects from the medication. She wants to be sure that the lesion is healing properly. It is no longer painful.  History Hannah French has a past medical history of Heart murmur; Mitral valve prolapse; Dyslipidemia; Bladder cancer; Pseudogout; Diabetes mellitus; PVD (peripheral vascular disease); Abdominal bruit; Carotid bruit; and Osteoporosis.   She has past surgical history that includes Laparoscopic cholecystectomy (2009); Appendectomy; and Hysterectomy-type unspecified.   Her family history includes Heart failure in her mother. There is no history of Coronary artery disease.She reports that she has been smoking Cigarettes.  She has a 30 pack-year smoking history. She has never used smokeless tobacco. She reports that she does not drink alcohol or use illicit drugs.  Current Outpatient Prescriptions on File Prior to Visit  Medication Sig Dispense Refill  . acitretin (SORIATANE) 10 MG capsule Take 1 capsule (10 mg total) by mouth daily before breakfast. 90 capsule 3  . aspirin 81 MG tablet Take 81 mg by mouth daily.    . B Complex Vitamins (VITAMIN B COMPLEX PO) Take by mouth daily.    . betamethasone dipropionate (DIPROLENE) 0.05 % cream Apply topically 2 (two) times daily. 45 g 0  . carbamide peroxide (DEBROX) 6.5 % otic solution Use once daily in each ear. About 5 drops in one year while laying with that side. After about 10 minutes turned over to the other side and put 5 drops in that ear and lay with that ear for about 10 minutes. Daily for 10 days then weekly 15 mL 11  . Cholecalciferol 1000 UNITS tablet Take 1 tablet (1,000 Units total) by mouth daily. 60 tablet 3  .  dapsone 25 MG tablet Take 3 tablets (75 mg total) by mouth daily. 21 tablet 0  . fexofenadine-pseudoephedrine (ALLEGRA-D ALLERGY & CONGESTION) 180-240 MG per 24 hr tablet Take 1 tablet by mouth daily. In the evening 30 tablet 11  . furosemide (LASIX) 20 MG tablet Take 1 tablet (20 mg total) by mouth as needed. 90 tablet 1  . glimepiride (AMARYL) 4 MG tablet Take 1 tablet (4 mg total) by mouth daily with breakfast. only Take one tablet daily after steroid shot 90 tablet 1  . ibuprofen (ADVIL,MOTRIN) 200 MG tablet Take 200 mg by mouth as needed.       No current facility-administered medications on file prior to visit.    ROS Review of Systems  Constitutional: Negative for fever, chills, diaphoresis and activity change.  HENT: Negative.   Respiratory: Negative.   Cardiovascular: Negative.   Skin: Positive for rash (skin lesions under treatment with psoralen are improving).    Objective:  BP 105/66 mmHg  Pulse 113  Temp(Src) 97.6 F (36.4 C) (Oral)  Ht 5' 1.5" (1.562 m)  Wt 127 lb 9.6 oz (57.879 kg)  BMI 23.72 kg/m2  BP Readings from Last 3 Encounters:  01/01/15 105/66  12/25/14 105/72  12/21/14 127/79    Wt Readings from Last 3 Encounters:  01/01/15 127 lb 9.6 oz (57.879 kg)  12/25/14 126 lb 9.6 oz (57.425 kg)  12/21/14 126 lb 6.4 oz (57.335 kg)     Physical Exam  Constitutional:  She appears well-developed and well-nourished. No distress.  HENT:  Head: Normocephalic.  Eyes: Pupils are equal, round, and reactive to light.  Neck: Neck supple.  Cardiovascular: Normal rate and regular rhythm.   Pulmonary/Chest: Effort normal.  Skin: Skin is warm and dry.  The lesion on the right buttock was previously described in the most recent office visit note. Currently the erythema has receded and has been replaced by tissue hyperlipidemia based on healing. It is a moderate pink without redness. There is no induration or fluctuance to the lesion. The hyperemia is 2 cm in diameter.  There is a 1 cm central eschar. This appears to be loose therefore the lesion was cleansed with Betadine and the eschar was easily removed with sterile forceps. Underneath was fresh skin formation.    Lab Results  Component Value Date   HGBA1C 8.7 11/27/2014   HGBA1C 7.1 06/09/2014   HGBA1C 7.4% 12/29/2013    Lab Results  Component Value Date   WBC 10.4* 11/27/2014   HGB 14.6 11/27/2014   HCT 46.2 11/27/2014   PLT 296 11/29/2007   GLUCOSE 113* 11/27/2014   CHOL 206* 11/27/2014   TRIG 135 11/27/2014   HDL 56 11/27/2014   LDLCALC 123* 11/27/2014   ALT 12 11/27/2014   AST 17 11/27/2014   NA 141 11/27/2014   K 4.6 11/27/2014   CL 97 11/27/2014   CREATININE 0.75 11/27/2014   BUN 15 11/27/2014   CO2 26 11/27/2014   TSH 2.540 06/09/2014   HGBA1C 8.7 11/27/2014    Ct Head Wo Contrast  10/21/2013   CLINICAL DATA:  Posterior left side; headache; no known injury; pain and "knot" under left eye; hx bladder ca; diabetes; pvd  EXAM: CT HEAD WITHOUT CONTRAST  CT MAXILLOFACIAL WITHOUT CONTRAST  TECHNIQUE: Multidetector CT imaging of the head and maxillofacial structures were performed using the standard protocol without intravenous contrast. Multiplanar CT image reconstructions of the maxillofacial structures were also generated.  COMPARISON:  None.  FINDINGS: CT HEAD FINDINGS  There is no evidence of acute hemorrhage. There is no evidence of extra-axial fluid collections. Mild diffuse cortical atrophy is appreciated. Basal ganglial calcifications are identified. Mild areas of low attenuation project within the subcortical and periventricular white matter regions. A focal wedge-shaped area of low attenuation projects along the periphery of the left cerebellar hemisphere. There is no evidence of a depressed skull fracture. Mild mucosal thickening is appreciated within the ethmoid air cells. Paranasal sinuses and mastoid air cells are otherwise unremarkable.  CT MAXILLOFACIAL FINDINGS  There is no  evidence of fracture, dislocation or malalignment. Mild mucosal thickening is appreciated within the ethmoid air cells otherwise the paranasal sinuses are patent. The mastoid air cells are unremarkable. The temporomandibular joints and mandible are unremarkable.  IMPRESSION: 1. Chronic and involutional changes without evidence of focal or acute intracranial abnormalities 2. Findings which may reflect mild sinus disease within the ethmoid air cells. Maxillofacial evaluation is otherwise unremarkable.   Electronically Signed   By: Margaree Mackintosh M.D.   On: 10/21/2013 18:33   Ct Maxillofacial Wo Cm  10/21/2013   CLINICAL DATA:  Posterior left side; headache; no known injury; pain and "knot" under left eye; hx bladder ca; diabetes; pvd  EXAM: CT HEAD WITHOUT CONTRAST  CT MAXILLOFACIAL WITHOUT CONTRAST  TECHNIQUE: Multidetector CT imaging of the head and maxillofacial structures were performed using the standard protocol without intravenous contrast. Multiplanar CT image reconstructions of the maxillofacial structures were also generated.  COMPARISON:  None.  FINDINGS: CT HEAD FINDINGS  There is no evidence of acute hemorrhage. There is no evidence of extra-axial fluid collections. Mild diffuse cortical atrophy is appreciated. Basal ganglial calcifications are identified. Mild areas of low attenuation project within the subcortical and periventricular white matter regions. A focal wedge-shaped area of low attenuation projects along the periphery of the left cerebellar hemisphere. There is no evidence of a depressed skull fracture. Mild mucosal thickening is appreciated within the ethmoid air cells. Paranasal sinuses and mastoid air cells are otherwise unremarkable.  CT MAXILLOFACIAL FINDINGS  There is no evidence of fracture, dislocation or malalignment. Mild mucosal thickening is appreciated within the ethmoid air cells otherwise the paranasal sinuses are patent. The mastoid air cells are unremarkable. The  temporomandibular joints and mandible are unremarkable.  IMPRESSION: 1. Chronic and involutional changes without evidence of focal or acute intracranial abnormalities 2. Findings which may reflect mild sinus disease within the ethmoid air cells. Maxillofacial evaluation is otherwise unremarkable.   Electronically Signed   By: Margaree Mackintosh M.D.   On: 10/21/2013 18:33    Assessment & Plan:   Hannah French was seen today for insect bite.  Diagnoses and all orders for this visit:  Spider bite allergy, current reaction, accidental or unintentional, initial encounter  Other orders -     Fluticasone-Salmeterol (ADVAIR DISKUS) 250-50 MCG/DOSE AEPB; Inhale 1 puff into the lungs 2 (two) times daily.  I am having [name] start on Fluticasone-Salmeterol. I am also having her maintain her ibuprofen, B Complex Vitamins (VITAMIN B COMPLEX PO), aspirin, Cholecalciferol, glimepiride, furosemide, acitretin, betamethasone dipropionate, carbamide peroxide, fexofenadine-pseudoephedrine, and dapsone.  Meds ordered this encounter  Medications  . Fluticasone-Salmeterol (ADVAIR DISKUS) 250-50 MCG/DOSE AEPB    Sig: Inhale 1 puff into the lungs 2 (two) times daily.    Dispense:  1 each    Refill:  3     Follow-up: Return in about 3 months (around 04/02/2015) for diabetes.  Claretta Fraise, M.D.

## 2015-02-10 ENCOUNTER — Encounter (INDEPENDENT_AMBULATORY_CARE_PROVIDER_SITE_OTHER): Payer: Medicare Other | Admitting: Ophthalmology

## 2015-02-10 DIAGNOSIS — H3531 Nonexudative age-related macular degeneration: Secondary | ICD-10-CM

## 2015-02-10 DIAGNOSIS — H43813 Vitreous degeneration, bilateral: Secondary | ICD-10-CM | POA: Diagnosis not present

## 2015-02-10 DIAGNOSIS — H2513 Age-related nuclear cataract, bilateral: Secondary | ICD-10-CM

## 2015-02-12 DIAGNOSIS — C679 Malignant neoplasm of bladder, unspecified: Secondary | ICD-10-CM | POA: Diagnosis not present

## 2015-02-15 ENCOUNTER — Telehealth: Payer: Self-pay | Admitting: Pharmacist

## 2015-02-15 NOTE — Telephone Encounter (Signed)
Called patient to schedule appt to administer Prolia injection.  Determined to have osteoporosis 08/2014.  At that time check insurance coverage of Prolia and was covered with PA.  However patient did not receive because by the time we were able to get PA she was in [place] for 3 months.  Followed up 12/21/14 - patient asked to post pone until later in the spring.  Called today and patient again wants to postpone Prolia because she is having consultation about lens implants and she if afraid of side effects with Prolia.  Discussed possible side effect and alternate medications for osteoporosis but patient declines treatment at this time.  Also asked about scheduling Medicare Wellness Visit - patient declined at this time.

## 2015-02-17 DIAGNOSIS — E119 Type 2 diabetes mellitus without complications: Secondary | ICD-10-CM | POA: Diagnosis not present

## 2015-02-18 DIAGNOSIS — E119 Type 2 diabetes mellitus without complications: Secondary | ICD-10-CM | POA: Diagnosis not present

## 2015-03-18 DIAGNOSIS — H25011 Cortical age-related cataract, right eye: Secondary | ICD-10-CM | POA: Diagnosis not present

## 2015-03-18 DIAGNOSIS — H2511 Age-related nuclear cataract, right eye: Secondary | ICD-10-CM | POA: Diagnosis not present

## 2015-03-18 DIAGNOSIS — H3531 Nonexudative age-related macular degeneration: Secondary | ICD-10-CM | POA: Diagnosis not present

## 2015-03-18 DIAGNOSIS — H2512 Age-related nuclear cataract, left eye: Secondary | ICD-10-CM | POA: Diagnosis not present

## 2015-03-18 DIAGNOSIS — H25012 Cortical age-related cataract, left eye: Secondary | ICD-10-CM | POA: Diagnosis not present

## 2015-03-30 DIAGNOSIS — H2511 Age-related nuclear cataract, right eye: Secondary | ICD-10-CM | POA: Diagnosis not present

## 2015-04-19 DIAGNOSIS — H2512 Age-related nuclear cataract, left eye: Secondary | ICD-10-CM | POA: Diagnosis not present

## 2015-04-19 DIAGNOSIS — H25012 Cortical age-related cataract, left eye: Secondary | ICD-10-CM | POA: Diagnosis not present

## 2015-04-27 DIAGNOSIS — H2512 Age-related nuclear cataract, left eye: Secondary | ICD-10-CM | POA: Diagnosis not present

## 2015-05-13 DIAGNOSIS — R928 Other abnormal and inconclusive findings on diagnostic imaging of breast: Secondary | ICD-10-CM | POA: Diagnosis not present

## 2015-05-13 DIAGNOSIS — Z1231 Encounter for screening mammogram for malignant neoplasm of breast: Secondary | ICD-10-CM | POA: Diagnosis not present

## 2015-05-20 DIAGNOSIS — M1711 Unilateral primary osteoarthritis, right knee: Secondary | ICD-10-CM | POA: Diagnosis not present

## 2015-05-20 DIAGNOSIS — M170 Bilateral primary osteoarthritis of knee: Secondary | ICD-10-CM | POA: Diagnosis not present

## 2015-05-20 DIAGNOSIS — M1712 Unilateral primary osteoarthritis, left knee: Secondary | ICD-10-CM | POA: Diagnosis not present

## 2015-05-24 ENCOUNTER — Ambulatory Visit (INDEPENDENT_AMBULATORY_CARE_PROVIDER_SITE_OTHER): Payer: Medicare Other | Admitting: Physician Assistant

## 2015-05-24 ENCOUNTER — Encounter: Payer: Self-pay | Admitting: Physician Assistant

## 2015-05-24 ENCOUNTER — Other Ambulatory Visit: Payer: Self-pay | Admitting: Physician Assistant

## 2015-05-24 VITALS — BP 122/67 | HR 103 | Temp 97.4°F | Ht 61.5 in | Wt 126.0 lb

## 2015-05-24 DIAGNOSIS — L814 Other melanin hyperpigmentation: Secondary | ICD-10-CM | POA: Diagnosis not present

## 2015-05-24 DIAGNOSIS — D485 Neoplasm of uncertain behavior of skin: Secondary | ICD-10-CM | POA: Diagnosis not present

## 2015-05-24 DIAGNOSIS — L259 Unspecified contact dermatitis, unspecified cause: Secondary | ICD-10-CM | POA: Diagnosis not present

## 2015-05-24 DIAGNOSIS — L570 Actinic keratosis: Secondary | ICD-10-CM | POA: Diagnosis not present

## 2015-05-24 NOTE — Patient Instructions (Signed)
Continue to use Dove soap Aveeno Eczema lotion immediately after showering Skin biopsy to rule out skin cancer on your leg. If psoriasis, I will send cream to pharmacy for relief.

## 2015-05-24 NOTE — Progress Notes (Signed)
   Subjective:    Patient ID: Hannah French, female    DOB: 01/15/1941, 74 y.o.   MRN: [id number]  HPI 74  Y/o female with h/o Basal Cell skin Cancer on LLE presents for skin check, She usually sees Dr. Allyson Sabal in [place].    Review of Systems  Constitutional: Negative.   HENT: Negative.        Small bump on left side of nose where previous BCC was treated.   Eyes: Negative.   Respiratory: Negative.   Cardiovascular: Negative.   Gastrointestinal: Negative.   Endocrine: Negative.   Genitourinary: Negative.   Musculoskeletal: Negative.   Skin:       Pruritic lesions on BLE, distal extremities, cracking and scale on bilateral hands         Objective:   Physical Exam  Skin:  Whitish scale on bilateral hands. Fingernails and discolored and hyperkeratotic. - Patient states this is managed by her Dr at [facility] and occurred after her chemo  Plaques on bilateral lower extremities, primarily shins. Whitish scale present. With base erythema  Fibrotic hypopigmented papule on left lateral nostril  Scattered lentigo on BLE and face, Generalized actinic changes from sun exposure.           Assessment & Plan:  1. Neoplasm of uncertain behavior of skin  - Pathology- Shave biopsy of hyperkeratotic scaling lesion, approximately .10mm on LLE shin to r/o BCC/SCC/ psoriasis. Await path and treat accordingly. Patient also has similar lesion on other areas of legs.   2. Actinic keratosis - 3 areas treated with cryosurgery today on face and LUE  3. Lentigo No treatment required.   RTO pending path results.   Alyona Romack A. Benjamin Stain PA-C

## 2015-05-26 DIAGNOSIS — N6001 Solitary cyst of right breast: Secondary | ICD-10-CM | POA: Diagnosis not present

## 2015-05-28 LAB — PATHOLOGY

## 2015-06-03 ENCOUNTER — Other Ambulatory Visit: Payer: Self-pay | Admitting: Physician Assistant

## 2015-06-03 ENCOUNTER — Telehealth: Payer: Self-pay | Admitting: Family Medicine

## 2015-06-03 DIAGNOSIS — L409 Psoriasis, unspecified: Secondary | ICD-10-CM

## 2015-06-03 MED ORDER — CLOBETASOL PROPIONATE 0.05 % EX OINT: 1.0000 "application " | TOPICAL_OINTMENT | Freq: Two times a day (BID) | CUTANEOUS | Status: DC

## 2015-06-04 NOTE — Progress Notes (Signed)
Patient aware. Will pick up rx °

## 2015-06-18 DIAGNOSIS — E119 Type 2 diabetes mellitus without complications: Secondary | ICD-10-CM | POA: Diagnosis not present

## 2015-07-06 ENCOUNTER — Encounter: Payer: Self-pay | Admitting: Family Medicine

## 2015-07-29 ENCOUNTER — Encounter (HOSPITAL_COMMUNITY): Payer: Self-pay

## 2015-07-29 ENCOUNTER — Other Ambulatory Visit (HOSPITAL_COMMUNITY): Payer: Self-pay

## 2015-07-30 ENCOUNTER — Ambulatory Visit (INDEPENDENT_AMBULATORY_CARE_PROVIDER_SITE_OTHER): Payer: Medicare Other

## 2015-07-30 DIAGNOSIS — Z23 Encounter for immunization: Secondary | ICD-10-CM | POA: Diagnosis not present

## 2015-08-26 DIAGNOSIS — E119 Type 2 diabetes mellitus without complications: Secondary | ICD-10-CM | POA: Insufficient documentation

## 2015-08-26 NOTE — Progress Notes (Signed)
Subjective:  Patient ID: Hannah French, female    DOB: 06-Feb-1941  Age: 74 y.o. MRN: [id number]  CC: Diabetes; Hyperlipidemia; and Vit D deficiency   HPI BREONNA GAFFORD presents for  follow-up of elevated cholesterol. Doing well without complaints on current medication. Denies side effects of statin including myalgia and arthralgia and nausea. Also in today for liver function testing. Currently no chest pain, shortness of breath or other cardiovascular related symptoms noted.  Follow-up of diabetes. Patient does check blood  Fasting only. Runs 100-135. While using pred drops after recent cataract surgery it went as high as 160. Not checking postprandial Patient denies excessive hunger, nausea No significant hypoglycemic spells noted. Medications as noted below. Taking them regularly without complication/adverse reaction being reported today.    History [name] has a past medical history of Heart murmur; Mitral valve prolapse; Dyslipidemia; Bladder cancer ([place]); Pseudogout; Diabetes mellitus; PVD (peripheral vascular disease) ([place]); Abdominal bruit; Carotid bruit; and Osteoporosis.   She has past surgical history that includes Laparoscopic cholecystectomy (2009); Appendectomy; and Hysterectomy-type unspecified.   Her family history includes Heart failure in her mother. There is no history of Coronary artery disease.She reports that she has been smoking Cigarettes.  She has a 30 pack-year smoking history. She has never used smokeless tobacco. She reports that she does not drink alcohol or use illicit drugs.  Current Outpatient Prescriptions on File Prior to Visit  Medication Sig Dispense Refill  . aspirin 81 MG tablet Take 81 mg by mouth daily.    . B Complex Vitamins (VITAMIN B COMPLEX PO) Take by mouth daily.    . carbamide peroxide (DEBROX) 6.5 % otic solution Use once daily in each ear. About 5 drops in one year while laying with that side. After about 10 minutes turned over to the other  side and put 5 drops in that ear and lay with that ear for about 10 minutes. Daily for 10 days then weekly 15 mL 11  . Cholecalciferol 1000 UNITS tablet Take 1 tablet (1,000 Units total) by mouth daily. 60 tablet 3  . clobetasol ointment (TEMOVATE) 8.14 % Apply 1 application topically 2 (two) times daily. 30 g 0  . Fluticasone-Salmeterol (ADVAIR DISKUS) 250-50 MCG/DOSE AEPB Inhale 1 puff into the lungs 2 (two) times daily. 1 each 3  . furosemide (LASIX) 20 MG tablet Take 1 tablet (20 mg total) by mouth as needed. 90 tablet 1  . glimepiride (AMARYL) 4 MG tablet Take 1 tablet (4 mg total) by mouth daily with breakfast. only Take one tablet daily after steroid shot 90 tablet 1  . ibuprofen (ADVIL,MOTRIN) 200 MG tablet Take 200 mg by mouth as needed.       No current facility-administered medications on file prior to visit.    ROS Review of Systems  Constitutional: Negative for fever, activity change and appetite change.  HENT: Negative for congestion, rhinorrhea and sore throat.   Eyes: Negative for visual disturbance.  Respiratory: Negative for cough and shortness of breath.   Cardiovascular: Negative for chest pain and palpitations.  Gastrointestinal: Negative for nausea, abdominal pain and diarrhea.  Genitourinary: Negative for dysuria.  Musculoskeletal: Negative for myalgias and arthralgias.    Objective:  BP 115/67 mmHg  Pulse 94  Temp(Src) 97.1 F (36.2 C) (Oral)  Ht 5' 1.5" (1.562 m)  Wt 129 lb (58.514 kg)  BMI 23.98 kg/m2  SpO2 94%  BP Readings from Last 3 Encounters:  08/27/15 115/67  05/24/15 122/67  01/01/15 105/66  Wt Readings from Last 3 Encounters:  08/27/15 129 lb (58.514 kg)  05/24/15 126 lb (57.153 kg)  01/01/15 127 lb 9.6 oz (57.879 kg)     Physical Exam  Constitutional: She is oriented to person, place, and time. She appears well-developed and well-nourished. No distress.  HENT:  Head: Normocephalic and atraumatic.  Right Ear: External ear normal.    Left Ear: External ear normal.  Nose: Nose normal.  Mouth/Throat: Oropharynx is clear and moist.  Eyes: Conjunctivae and EOM are normal. Pupils are equal, round, and reactive to light.  Neck: Normal range of motion. Neck supple. No thyromegaly present.  Cardiovascular: Normal rate, regular rhythm and normal heart sounds.   No murmur heard. Pulmonary/Chest: Effort normal and breath sounds normal. No respiratory distress. She has no wheezes. She has no rales.  Abdominal: Soft. Bowel sounds are normal. She exhibits no distension. There is no tenderness.  Lymphadenopathy:    She has no cervical adenopathy.  Neurological: She is alert and oriented to person, place, and time. She has normal reflexes.  Skin: Skin is warm and dry.  Psychiatric: She has a normal mood and affect. Her behavior is normal. Judgment and thought content normal.   Diabetic Foot Exam - Simple   Simple Foot Form  Diabetic Foot exam was performed with the following findings:  Yes 08/27/2015 12:22 PM  Visual Inspection  No deformities, no ulcerations, no other skin breakdown bilaterally:  Yes  Sensation Testing  Intact to touch and monofilament testing bilaterally:  Yes  Pulse Check  Posterior Tibialis and Dorsalis pulse intact bilaterally:  Yes  Comments      Lab Results  Component Value Date   HGBA1C 8.0 08/27/2015   HGBA1C 8.7 11/27/2014   HGBA1C 7.1 06/09/2014    Lab Results  Component Value Date   WBC 10.4* 11/27/2014   HGB 14.6 11/27/2014   HCT 46.2 11/27/2014   PLT 296 11/29/2007   GLUCOSE 113* 11/27/2014   CHOL 206* 11/27/2014   TRIG 135 11/27/2014   HDL 56 11/27/2014   LDLCALC 123* 11/27/2014   ALT 12 11/27/2014   AST 17 11/27/2014   NA 141 11/27/2014   K 4.6 11/27/2014   CL 97 11/27/2014   CREATININE 0.75 11/27/2014   BUN 15 11/27/2014   CO2 26 11/27/2014   TSH 2.540 06/09/2014   HGBA1C 8.0 08/27/2015    Ct Head Wo Contrast  10/21/2013  CLINICAL DATA:  Posterior left side; headache;  no known injury; pain and "knot" under left eye; hx bladder ca; diabetes; pvd EXAM: CT HEAD WITHOUT CONTRAST CT MAXILLOFACIAL WITHOUT CONTRAST TECHNIQUE: Multidetector CT imaging of the head and maxillofacial structures were performed using the standard protocol without intravenous contrast. Multiplanar CT image reconstructions of the maxillofacial structures were also generated. COMPARISON:  None. FINDINGS: CT HEAD FINDINGS There is no evidence of acute hemorrhage. There is no evidence of extra-axial fluid collections. Mild diffuse cortical atrophy is appreciated. Basal ganglial calcifications are identified. Mild areas of low attenuation project within the subcortical and periventricular white matter regions. A focal wedge-shaped area of low attenuation projects along the periphery of the left cerebellar hemisphere. There is no evidence of a depressed skull fracture. Mild mucosal thickening is appreciated within the ethmoid air cells. Paranasal sinuses and mastoid air cells are otherwise unremarkable. CT MAXILLOFACIAL FINDINGS There is no evidence of fracture, dislocation or malalignment. Mild mucosal thickening is appreciated within the ethmoid air cells otherwise the paranasal sinuses are patent. The mastoid air cells  are unremarkable. The temporomandibular joints and mandible are unremarkable. IMPRESSION: 1. Chronic and involutional changes without evidence of focal or acute intracranial abnormalities 2. Findings which may reflect mild sinus disease within the ethmoid air cells. Maxillofacial evaluation is otherwise unremarkable. Electronically Signed   By: Margaree Mackintosh M.D.   On: 10/21/2013 18:33   Ct Maxillofacial Wo Cm  10/21/2013  CLINICAL DATA:  Posterior left side; headache; no known injury; pain and "knot" under left eye; hx bladder ca; diabetes; pvd EXAM: CT HEAD WITHOUT CONTRAST CT MAXILLOFACIAL WITHOUT CONTRAST TECHNIQUE: Multidetector CT imaging of the head and maxillofacial structures were  performed using the standard protocol without intravenous contrast. Multiplanar CT image reconstructions of the maxillofacial structures were also generated. COMPARISON:  None. FINDINGS: CT HEAD FINDINGS There is no evidence of acute hemorrhage. There is no evidence of extra-axial fluid collections. Mild diffuse cortical atrophy is appreciated. Basal ganglial calcifications are identified. Mild areas of low attenuation project within the subcortical and periventricular white matter regions. A focal wedge-shaped area of low attenuation projects along the periphery of the left cerebellar hemisphere. There is no evidence of a depressed skull fracture. Mild mucosal thickening is appreciated within the ethmoid air cells. Paranasal sinuses and mastoid air cells are otherwise unremarkable. CT MAXILLOFACIAL FINDINGS There is no evidence of fracture, dislocation or malalignment. Mild mucosal thickening is appreciated within the ethmoid air cells otherwise the paranasal sinuses are patent. The mastoid air cells are unremarkable. The temporomandibular joints and mandible are unremarkable. IMPRESSION: 1. Chronic and involutional changes without evidence of focal or acute intracranial abnormalities 2. Findings which may reflect mild sinus disease within the ethmoid air cells. Maxillofacial evaluation is otherwise unremarkable. Electronically Signed   By: Margaree Mackintosh M.D.   On: 10/21/2013 18:33    Assessment & Plan:   [name] was seen today for diabetes, hyperlipidemia and vit d deficiency.  Diagnoses and all orders for this visit:  Hyperlipidemia with target LDL less than 70 -     Ultrasound ankle / brachial indices extremity complete; Future -     US Aorta/IVC/Iliacs Doppler; Future -     CMP14+EGFR; Standing -     Lipid panel; Standing -     CMP14+EGFR -     Lipid panel  Osteoporosis -     VITAMIN D 25 Hydroxy (Vit-D Deficiency, Fractures); Standing -     VITAMIN D 25 Hydroxy (Vit-D Deficiency,  Fractures)  Type 2 diabetes mellitus without complication, without long-term current use of insulin (HCC) -     POCT glycosylated hemoglobin (Hb A1C); Standing -     CMP14+EGFR; Standing -     Lipid panel; Standing -     Microalbumin / creatinine urine ratio -     POCT glycosylated hemoglobin (Hb A1C) -     CMP14+EGFR -     Lipid panel  Carotid stenosis, bilateral -     US Carotid Bilateral; Future  Vitamin D deficiency -     VITAMIN D 25 Hydroxy (Vit-D Deficiency, Fractures); Standing -     VITAMIN D 25 Hydroxy (Vit-D Deficiency, Fractures)  Other orders -     linagliptin (TRADJENTA) 5 MG TABS tablet; Take 1 tablet (5 mg total) by mouth daily. For diabetes   I have discontinued Ms. Shimp's betamethasone dipropionate. I am also having her start on linagliptin. Additionally, I am having her maintain her ibuprofen, B Complex Vitamins (VITAMIN B COMPLEX PO), aspirin, Cholecalciferol, glimepiride, furosemide, carbamide peroxide, Fluticasone-Salmeterol, and clobetasol ointment.  Meds  ordered this encounter  Medications  . linagliptin (TRADJENTA) 5 MG TABS tablet    Sig: Take 1 tablet (5 mg total) by mouth daily. For diabetes    Dispense:  30 tablet    Refill:  5     Follow-up: Return in about 3 months (around 11/25/2015) for diabetes, cholesterol.  Claretta Fraise, M.D.

## 2015-08-27 ENCOUNTER — Telehealth: Payer: Self-pay | Admitting: Family Medicine

## 2015-08-27 ENCOUNTER — Encounter: Payer: Self-pay | Admitting: Family Medicine

## 2015-08-27 ENCOUNTER — Ambulatory Visit (INDEPENDENT_AMBULATORY_CARE_PROVIDER_SITE_OTHER): Payer: Medicare Other | Admitting: Family Medicine

## 2015-08-27 VITALS — BP 115/67 | HR 94 | Temp 97.1°F | Ht 61.5 in | Wt 129.0 lb

## 2015-08-27 DIAGNOSIS — E119 Type 2 diabetes mellitus without complications: Secondary | ICD-10-CM | POA: Diagnosis not present

## 2015-08-27 DIAGNOSIS — E785 Hyperlipidemia, unspecified: Secondary | ICD-10-CM

## 2015-08-27 DIAGNOSIS — M810 Age-related osteoporosis without current pathological fracture: Secondary | ICD-10-CM

## 2015-08-27 DIAGNOSIS — E559 Vitamin D deficiency, unspecified: Secondary | ICD-10-CM | POA: Diagnosis not present

## 2015-08-27 DIAGNOSIS — I6523 Occlusion and stenosis of bilateral carotid arteries: Secondary | ICD-10-CM | POA: Diagnosis not present

## 2015-08-27 LAB — POCT GLYCOSYLATED HEMOGLOBIN (HGB A1C): Hemoglobin A1C: 8

## 2015-08-27 MED ORDER — LINAGLIPTIN 5 MG PO TABS: 5.0000 mg | ORAL_TABLET | Freq: Every day | ORAL | Status: DC

## 2015-08-27 NOTE — Telephone Encounter (Signed)
Patient states that she is going out of town for 3 months and does not want to add a new diabetic medication while she is gone. Please advise

## 2015-08-27 NOTE — Telephone Encounter (Signed)
That is not a good choice. I would advise she call me if she has problems, but most people do extremely well with this.It is not a medicine that requires close medical supervision, and I am just a phone call away.

## 2015-08-27 NOTE — Telephone Encounter (Signed)
Spoke with pt regarding recommendation Pt verbalizes understanding

## 2015-08-27 NOTE — Patient Instructions (Addendum)
Check glucose before eating in the morning and again two hours after supper. Write down the results on the glucose log sheet. Bring the log with you to the next appointment  It is time for you to have your carotid ultrasound, the aortoiliac ultrasound and the ankle-brachial index of your legs for circulation via ultrasound. I have put in referrals for those. You should get a call from the office in the next few days regarding scheduling.

## 2015-08-28 ENCOUNTER — Other Ambulatory Visit: Payer: Self-pay | Admitting: Family Medicine

## 2015-08-28 LAB — CMP14+EGFR
ALT: 11 IU/L (ref 0–32)
AST: 17 IU/L (ref 0–40)
Albumin/Globulin Ratio: 1.5 (ref 1.1–2.5)
Albumin: 4.2 g/dL (ref 3.5–4.8)
Alkaline Phosphatase: 65 IU/L (ref 39–117)
BUN/Creatinine Ratio: 18 (ref 11–26)
BUN: 14 mg/dL (ref 8–27)
Bilirubin Total: 0.7 mg/dL (ref 0.0–1.2)
CO2: 29 mmol/L (ref 18–29)
Calcium: 9.7 mg/dL (ref 8.7–10.3)
Chloride: 99 mmol/L (ref 97–106)
Creatinine, Ser: 0.76 mg/dL (ref 0.57–1.00)
GFR calc Af Amer: 89 mL/min/1.73 (ref >59)
GFR calc non Af Amer: 77 mL/min/1.73 (ref >59)
Globulin, Total: 2.8 g/dL (ref 1.5–4.5)
Glucose: 111 mg/dL — ABNORMAL HIGH (ref 65–99)
Potassium: 4.6 mmol/L (ref 3.5–5.2)
Sodium: 143 mmol/L (ref 136–144)
Total Protein: 7 g/dL (ref 6.0–8.5)

## 2015-08-28 LAB — LIPID PANEL
Chol/HDL Ratio: 3.7 ratio (ref 0.0–4.4)
Cholesterol, Total: 191 mg/dL (ref 100–199)
HDL: 51 mg/dL (ref >39)
LDL Calculated: 121 mg/dL — ABNORMAL HIGH (ref 0–99)
Triglycerides: 93 mg/dL (ref 0–149)
VLDL Cholesterol Cal: 19 mg/dL (ref 5–40)

## 2015-08-28 LAB — MICROALBUMIN / CREATININE URINE RATIO
Creatinine, Urine: 19.3 mg/dL
MICROALB/CREAT RATIO: 37.3 mg/g{creat} — ABNORMAL HIGH (ref 0.0–30.0)
Microalbumin, Urine: 7.2 ug/mL

## 2015-08-28 LAB — VITAMIN D 25 HYDROXY (VIT D DEFICIENCY, FRACTURES): Vit D, 25-Hydroxy: 24.3 ng/mL — ABNORMAL LOW (ref 30.0–100.0)

## 2015-08-28 MED ORDER — VITAMIN D (ERGOCALCIFEROL) 1.25 MG (50000 UNIT) PO CAPS: 50000.0000 [IU] | ORAL_CAPSULE | ORAL | Status: DC

## 2015-08-31 DIAGNOSIS — M1712 Unilateral primary osteoarthritis, left knee: Secondary | ICD-10-CM | POA: Diagnosis not present

## 2015-08-31 DIAGNOSIS — M1711 Unilateral primary osteoarthritis, right knee: Secondary | ICD-10-CM | POA: Diagnosis not present

## 2015-08-31 DIAGNOSIS — M170 Bilateral primary osteoarthritis of knee: Secondary | ICD-10-CM | POA: Diagnosis not present

## 2015-09-01 ENCOUNTER — Other Ambulatory Visit: Payer: Self-pay | Admitting: Nurse Practitioner

## 2015-09-01 ENCOUNTER — Telehealth: Payer: Self-pay | Admitting: Family Medicine

## 2015-09-01 MED ORDER — GLIMEPIRIDE 4 MG PO TABS: 4.0000 mg | ORAL_TABLET | Freq: Every day | ORAL | Status: DC

## 2015-09-01 MED ORDER — GLUCOSE BLOOD VI STRP: ORAL_STRIP | Status: DC

## 2015-09-01 MED ORDER — ASPIRIN 81 MG PO TABS: 81.0000 mg | ORAL_TABLET | Freq: Every day | ORAL | Status: DC

## 2015-09-01 NOTE — Telephone Encounter (Signed)
Spoke with pt regarding RXs RXs sent into [facility] per Dr Livia Snellen

## 2015-09-03 ENCOUNTER — Other Ambulatory Visit: Payer: Self-pay

## 2015-09-03 ENCOUNTER — Telehealth: Payer: Self-pay | Admitting: Family Medicine

## 2015-09-03 DIAGNOSIS — L409 Psoriasis, unspecified: Secondary | ICD-10-CM

## 2015-09-03 MED ORDER — GLUCOSE BLOOD VI STRP: ORAL_STRIP | Status: DC

## 2015-09-03 MED ORDER — CLOBETASOL PROPIONATE 0.05 % EX OINT: 1.0000 "application " | TOPICAL_OINTMENT | Freq: Two times a day (BID) | CUTANEOUS | Status: DC

## 2015-09-03 NOTE — Telephone Encounter (Signed)
Please dont be mad at me, but im sending this back for a teaching moment. Use as instructed will never work, needs exact instructions and must have ICD-10 code.

## 2015-09-03 NOTE — Telephone Encounter (Signed)
RX changed and sent into [facility] per pt request Okayed per Dr Livia Snellen

## 2015-11-16 ENCOUNTER — Other Ambulatory Visit: Payer: Self-pay

## 2015-11-16 MED ORDER — GLUCOSE BLOOD VI STRP: ORAL_STRIP | Status: DC

## 2016-01-05 ENCOUNTER — Telehealth: Payer: Self-pay | Admitting: Family Medicine

## 2016-01-05 ENCOUNTER — Other Ambulatory Visit: Payer: Medicare Other

## 2016-01-05 DIAGNOSIS — E119 Type 2 diabetes mellitus without complications: Secondary | ICD-10-CM

## 2016-01-05 DIAGNOSIS — M1712 Unilateral primary osteoarthritis, left knee: Secondary | ICD-10-CM | POA: Diagnosis not present

## 2016-01-05 DIAGNOSIS — M170 Bilateral primary osteoarthritis of knee: Secondary | ICD-10-CM | POA: Diagnosis not present

## 2016-01-05 DIAGNOSIS — M1711 Unilateral primary osteoarthritis, right knee: Secondary | ICD-10-CM | POA: Diagnosis not present

## 2016-01-05 LAB — BAYER DCA HB A1C WAIVED: HB A1C (BAYER DCA - WAIVED): 7.8 % — ABNORMAL HIGH (ref <7.0)

## 2016-01-05 NOTE — Telephone Encounter (Signed)
Order put in and apt made Monday at 4:10 for follow up with Dr.Stacks

## 2016-01-06 LAB — CMP14+EGFR
ALT: 11 IU/L (ref 0–32)
AST: 21 IU/L (ref 0–40)
Albumin/Globulin Ratio: 1.4 (ref 1.2–2.2)
Albumin: 4.1 g/dL (ref 3.5–4.8)
Alkaline Phosphatase: 58 IU/L (ref 39–117)
BUN/Creatinine Ratio: 22 (ref 12–28)
BUN: 16 mg/dL (ref 8–27)
Bilirubin Total: 0.8 mg/dL (ref 0.0–1.2)
CO2: 24 mmol/L (ref 18–29)
Calcium: 9.4 mg/dL (ref 8.7–10.3)
Chloride: 97 mmol/L (ref 96–106)
Creatinine, Ser: 0.72 mg/dL (ref 0.57–1.00)
GFR calc Af Amer: 95 mL/min/{1.73_m2} (ref >59)
GFR calc non Af Amer: 82 mL/min/{1.73_m2} (ref >59)
Globulin, Total: 2.9 g/dL (ref 1.5–4.5)
Glucose: 133 mg/dL — ABNORMAL HIGH (ref 65–99)
Potassium: 4.3 mmol/L (ref 3.5–5.2)
Sodium: 140 mmol/L (ref 134–144)
Total Protein: 7 g/dL (ref 6.0–8.5)

## 2016-01-06 LAB — LIPID PANEL
Chol/HDL Ratio: 3.5 ratio units (ref 0.0–4.4)
Cholesterol, Total: 191 mg/dL (ref 100–199)
HDL: 55 mg/dL (ref >39)
LDL Calculated: 118 mg/dL — ABNORMAL HIGH (ref 0–99)
Triglycerides: 89 mg/dL (ref 0–149)
VLDL Cholesterol Cal: 18 mg/dL (ref 5–40)

## 2016-01-06 LAB — VITAMIN D 25 HYDROXY (VIT D DEFICIENCY, FRACTURES): Vit D, 25-Hydroxy: 37.5 ng/mL (ref 30.0–100.0)

## 2016-01-10 ENCOUNTER — Encounter: Payer: Self-pay | Admitting: Family Medicine

## 2016-01-10 ENCOUNTER — Ambulatory Visit (INDEPENDENT_AMBULATORY_CARE_PROVIDER_SITE_OTHER): Payer: Medicare Other | Admitting: Family Medicine

## 2016-01-10 VITALS — BP 99/66 | HR 102 | Temp 97.8°F | Ht 61.5 in | Wt 126.4 lb

## 2016-01-10 DIAGNOSIS — E559 Vitamin D deficiency, unspecified: Secondary | ICD-10-CM

## 2016-01-10 DIAGNOSIS — M810 Age-related osteoporosis without current pathological fracture: Secondary | ICD-10-CM | POA: Diagnosis not present

## 2016-01-10 DIAGNOSIS — E119 Type 2 diabetes mellitus without complications: Secondary | ICD-10-CM

## 2016-01-10 DIAGNOSIS — E785 Hyperlipidemia, unspecified: Secondary | ICD-10-CM | POA: Diagnosis not present

## 2016-01-10 MED ORDER — VITAMIN D (ERGOCALCIFEROL) 1.25 MG (50000 UNIT) PO CAPS: 50000.0000 [IU] | ORAL_CAPSULE | ORAL | Status: DC

## 2016-01-10 MED ORDER — EZETIMIBE 10 MG PO TABS: 10.0000 mg | ORAL_TABLET | Freq: Every day | ORAL | Status: DC

## 2016-01-10 MED ORDER — CANAGLIFLOZIN 100 MG PO TABS: 100.0000 mg | ORAL_TABLET | Freq: Every day | ORAL | Status: DC

## 2016-01-10 NOTE — Progress Notes (Signed)
Subjective:  Patient ID: Hannah French, female    DOB: 08-18-1941  Age: 75 y.o. MRN: [id number]  CC: Diabetes and Hyperlipidemia   HPI Hannah French presents for  follow-up of hypertension. Patient has no history of headache chest pain or shortness of breath or recent cough. Patient also denies symptoms of TIA such as numbness weakness lateralizing. Patient checks  blood pressure at home and has not had any elevated readings recently. Patient denies side effects from his medication. States taking it regularly.  Patient also  in for follow-up of elevated cholesterol. Experienced multiple statin side effects with crestor and pravasttatin in the past including myalgia and arthralgia and edema. Insists she can not take cholesterol medication. Currently no chest pain, shortness of breath or other cardiovascular related symptoms noted.  Follow-up of diabetes. Patient does not check blood sugar at home Patient denies symptoms such as polyuria, polydipsia, excessive hunger, nausea No significant hypoglycemic spells noted. Medications as noted. Taking amaryl for a long time. Works well for her. Realizes A1c too high though.  Concerned that Vit. D is up, but not enough.  History Hannah French has a past medical history of Heart murmur; Mitral valve prolapse; Dyslipidemia; Bladder cancer ([place]); Pseudogout; Diabetes mellitus; PVD (peripheral vascular disease) ([place]); Abdominal bruit; Carotid bruit; and Osteoporosis.   She has past surgical history that includes Laparoscopic cholecystectomy (2009); Appendectomy; and Hysterectomy-type unspecified.   Her family history includes Heart failure in her mother. There is no history of Coronary artery disease.She reports that she has been smoking Cigarettes.  She has a 30 pack-year smoking history. She has never used smokeless tobacco. She reports that she does not drink alcohol or use illicit drugs.  Current Outpatient Prescriptions on File Prior to Visit    Medication Sig Dispense Refill  . B Complex Vitamins (VITAMIN B COMPLEX PO) Take by mouth daily.    . carbamide peroxide (DEBROX) 6.5 % otic solution Use once daily in each ear. About 5 drops in one year while laying with that side. After about 10 minutes turned over to the other side and put 5 drops in that ear and lay with that ear for about 10 minutes. Daily for 10 days then weekly 15 mL 11  . Cholecalciferol 1000 UNITS tablet Take 1 tablet (1,000 Units total) by mouth daily. 60 tablet 3  . clobetasol ointment (TEMOVATE) [id number] % Apply 1 application topically 2 (two) times daily. 30 g 2  . Fluticasone-Salmeterol (ADVAIR DISKUS) 250-50 MCG/DOSE AEPB Inhale 1 puff into the lungs 2 (two) times daily. 1 each 3  . furosemide (LASIX) 20 MG tablet Take 1 tablet (20 mg total) by mouth as needed. 90 tablet 1  . glimepiride (AMARYL) 4 MG tablet TAKE (1) TABLET DAILY AS DIRECTED. 90 tablet 0  . glucose blood test strip Check blood glucose twice daily and prn 180 each 4  . ibuprofen (ADVIL,MOTRIN) 200 MG tablet Take 200 mg by mouth as needed.       No current facility-administered medications on file prior to visit.    ROS Review of Systems  Constitutional: Negative for fever, activity change and appetite change.  HENT: Negative for congestion, rhinorrhea and sore throat.   Eyes: Negative for visual disturbance.  Respiratory: Negative for cough and shortness of breath.   Cardiovascular: Negative for chest pain and palpitations.  Gastrointestinal: Negative for nausea, abdominal pain and diarrhea.  Genitourinary: Negative for dysuria.  Musculoskeletal: Negative for myalgias and arthralgias.    Objective:  BP  99/66 mmHg  Pulse 102  Temp(Src) 97.8 F (36.6 C) (Oral)  Ht 5' 1.5" (1.562 m)  Wt 126 lb 6.4 oz (57.335 kg)  BMI 23.50 kg/m2  SpO2 96%  BP Readings from Last 3 Encounters:  01/10/16 99/66  08/27/15 115/67  05/24/15 122/67    Wt Readings from Last 3 Encounters:  01/10/16 126 lb  6.4 oz (57.335 kg)  08/27/15 129 lb (58.514 kg)  05/24/15 126 lb (57.153 kg)     Physical Exam  Constitutional: She is oriented to person, place, and time. She appears well-developed and well-nourished. No distress.  HENT:  Head: Normocephalic and atraumatic.  Right Ear: External ear normal.  Left Ear: External ear normal.  Nose: Nose normal.  Mouth/Throat: Oropharynx is clear and moist.  Eyes: Conjunctivae and EOM are normal. Pupils are equal, round, and reactive to light.  Neck: Normal range of motion. Neck supple. No thyromegaly present.  Cardiovascular: Normal rate, regular rhythm and normal heart sounds.   No murmur heard. Pulmonary/Chest: Effort normal and breath sounds normal. No respiratory distress. She has no wheezes. She has no rales.  Abdominal: Soft. Bowel sounds are normal. She exhibits no distension. There is no tenderness.  Lymphadenopathy:    She has no cervical adenopathy.  Neurological: She is alert and oriented to person, place, and time. She has normal reflexes.  Skin: Skin is warm and dry.  Psychiatric: She has a normal mood and affect. Her behavior is normal. Judgment and thought content normal.    Lab Results  Component Value Date   HGBA1C 8.0 08/27/2015   HGBA1C 8.7 11/27/2014   HGBA1C 7.1 06/09/2014    Lab Results  Component Value Date   WBC 10.4* 11/27/2014   HGB 14.6 11/27/2014   HCT 46.2 11/27/2014   PLT 296 11/29/2007   GLUCOSE 133* 01/05/2016   CHOL 191 01/05/2016   TRIG 89 01/05/2016   HDL 55 01/05/2016   LDLCALC 118* 01/05/2016   ALT 11 01/05/2016   AST 21 01/05/2016   NA 140 01/05/2016   K 4.3 01/05/2016   CL 97 01/05/2016   CREATININE 0.72 01/05/2016   BUN 16 01/05/2016   CO2 24 01/05/2016   TSH 2.540 06/09/2014   HGBA1C 8.0 08/27/2015     Assessment & Plan:   Hannah French was seen today for diabetes and hyperlipidemia.  Diagnoses and all orders for this visit:  Type 2 diabetes mellitus without complication, without long-term  current use of insulin (HCC)  Osteoporosis  Hyperlipidemia with target LDL less than 70  Vitamin D deficiency  Other orders -     Vitamin D, Ergocalciferol, (DRISDOL) 50000 units CAPS capsule; Take 1 capsule (50,000 Units total) by mouth every 7 (seven) days. -     ezetimibe (ZETIA) 10 MG tablet; Take 1 tablet (10 mg total) by mouth daily. For cholesterol -     canagliflozin (INVOKANA) 100 MG TABS tablet; Take 1 tablet (100 mg total) by mouth daily before breakfast. For diabetes   I have discontinued Hannah French's linagliptin and aspirin. I have also changed her Vitamin D (Ergocalciferol). Additionally, I am having her start on ezetimibe and canagliflozin. Lastly, I am having her maintain her ibuprofen, B Complex Vitamins (VITAMIN B COMPLEX PO), Cholecalciferol, furosemide, carbamide peroxide, Fluticasone-Salmeterol, glimepiride, clobetasol ointment, and glucose blood.  Meds ordered this encounter  Medications  . Vitamin D, Ergocalciferol, (DRISDOL) 50000 units CAPS capsule    Sig: Take 1 capsule (50,000 Units total) by mouth every 7 (seven) days.  Dispense:  12 capsule    Refill:  3  . ezetimibe (ZETIA) 10 MG tablet    Sig: Take 1 tablet (10 mg total) by mouth daily. For cholesterol    Dispense:  90 tablet    Refill:  3  . canagliflozin (INVOKANA) 100 MG TABS tablet    Sig: Take 1 tablet (100 mg total) by mouth daily before breakfast. For diabetes    Dispense:  30 tablet    Refill:  2     Follow-up: Return in about 3 months (around 04/10/2016) for diabetes.  Claretta Fraise, M.D.

## 2016-02-17 ENCOUNTER — Ambulatory Visit (INDEPENDENT_AMBULATORY_CARE_PROVIDER_SITE_OTHER): Payer: Medicare Other | Admitting: Family Medicine

## 2016-02-17 ENCOUNTER — Encounter: Payer: Self-pay | Admitting: Family Medicine

## 2016-02-17 DIAGNOSIS — K219 Gastro-esophageal reflux disease without esophagitis: Secondary | ICD-10-CM

## 2016-02-17 DIAGNOSIS — N3000 Acute cystitis without hematuria: Secondary | ICD-10-CM

## 2016-02-17 DIAGNOSIS — R300 Dysuria: Secondary | ICD-10-CM | POA: Diagnosis not present

## 2016-02-17 LAB — URINALYSIS, COMPLETE
Bilirubin, UA: NEGATIVE
Glucose, UA: NEGATIVE
Nitrite, UA: POSITIVE — AB
Specific Gravity, UA: 1.02 (ref 1.005–1.030)
Urobilinogen, Ur: 0.2 mg/dL (ref 0.2–1.0)
pH, UA: 5.5 (ref 5.0–7.5)

## 2016-02-17 LAB — MICROSCOPIC EXAMINATION: WBC, UA: >30 /hpf — AB (ref 0–?)

## 2016-02-17 MED ORDER — SULFAMETHOXAZOLE-TRIMETHOPRIM 800-160 MG PO TABS: 1.0000 | ORAL_TABLET | Freq: Two times a day (BID) | ORAL | Status: DC

## 2016-02-17 MED ORDER — OMEPRAZOLE 20 MG PO CPDR: 20.0000 mg | DELAYED_RELEASE_CAPSULE | Freq: Every day | ORAL | Status: DC

## 2016-02-17 NOTE — Progress Notes (Signed)
BP 138/78 mmHg  Pulse 84  Temp(Src) 97.5 F (36.4 C) (Oral)  Ht 5' 1.5" (1.562 m)  Wt 123 lb 3.2 oz (55.883 kg)  BMI 22.90 kg/m2   Subjective:    Patient ID: Hannah French, female    DOB: 12/02/1940, 75 y.o.   MRN: [id number]  HPI: Hannah French is a 75 y.o. female presenting on 02/17/2016 for Urinary Tract Infection and Abdominal Pain   HPI Dysuria Patient has been having dysuria down in the urethral opening area especially after she finishes urinating she has tingling and burning there. She denies any urinary frequency or hematuria or odor to urine. She denies any fevers or chills or flank pain.  Epigastric abdominal pain and bloating Patient has been having epigastric abdominal pain and bloating and indigestion and increased belching and burning in her upper stomach. She denies any going up into her throat. She has a history of hiatal hernia and had been on medications for it previously and thinks she needs to be back on them. He denies any blood in her stool or diarrhea or constipation.  Relevant past medical, surgical, family and social history reviewed and updated as indicated. Interim medical history since our last visit reviewed. Allergies and medications reviewed and updated.  Review of Systems  Constitutional: Negative for fever and chills.  HENT: Negative for congestion, ear discharge and ear pain.   Eyes: Negative for redness and visual disturbance.  Respiratory: Negative for chest tightness and shortness of breath.   Cardiovascular: Negative for chest pain and leg swelling.  Gastrointestinal: Positive for abdominal pain. Negative for nausea, vomiting, diarrhea, constipation and blood in stool.  Genitourinary: Positive for dysuria. Negative for urgency, frequency, hematuria, flank pain, decreased urine volume and difficulty urinating.  Musculoskeletal: Negative for back pain and gait problem.  Skin: Negative for rash.  Neurological: Negative for light-headedness  and headaches.  Psychiatric/Behavioral: Negative for behavioral problems and agitation.  All other systems reviewed and are negative.   Per HPI unless specifically indicated above     Medication List       This list is accurate as of: 02/17/16 10:01 AM.  Always use your most recent med list.               canagliflozin 100 MG Tabs tablet  Commonly known as:  INVOKANA  Take 1 tablet (100 mg total) by mouth daily before breakfast. For diabetes     carbamide peroxide 6.5 % otic solution  Commonly known as:  DEBROX  Use once daily in each ear. About 5 drops in one year while laying with that side. After about 10 minutes turned over to the other side and put 5 drops in that ear and lay with that ear for about 10 minutes. Daily for 10 days then weekly     Cholecalciferol 1000 units tablet  Take 1 tablet (1,000 Units total) by mouth daily.     clobetasol ointment 0.05 %  Commonly known as:  TEMOVATE  Apply 1 application topically 2 (two) times daily.     ezetimibe 10 MG tablet  Commonly known as:  ZETIA  Take 1 tablet (10 mg total) by mouth daily. For cholesterol     Fluticasone-Salmeterol 250-50 MCG/DOSE Aepb  Commonly known as:  ADVAIR DISKUS  Inhale 1 puff into the lungs 2 (two) times daily.     furosemide 20 MG tablet  Commonly known as:  LASIX  Take 1 tablet (20 mg total) by mouth as needed.  glimepiride 4 MG tablet  Commonly known as:  AMARYL  TAKE (1) TABLET DAILY AS DIRECTED.     glucose blood test strip  Check blood glucose twice daily and prn     ibuprofen 200 MG tablet  Commonly known as:  ADVIL,MOTRIN  Take 200 mg by mouth as needed.     omeprazole 20 MG capsule  Commonly known as:  PRILOSEC  Take 1 capsule (20 mg total) by mouth daily.     sulfamethoxazole-trimethoprim 800-160 MG tablet  Commonly known as:  BACTRIM DS,SEPTRA DS  Take 1 tablet by mouth 2 (two) times daily.     VITAMIN B COMPLEX PO  Take by mouth daily.     Vitamin D  (Ergocalciferol) 50000 units Caps capsule  Commonly known as:  DRISDOL  Take 1 capsule (50,000 Units total) by mouth every 7 (seven) days.           Objective:    BP 138/78 mmHg  Pulse 84  Temp(Src) 97.5 F (36.4 C) (Oral)  Ht 5' 1.5" (1.562 m)  Wt 123 lb 3.2 oz (55.883 kg)  BMI 22.90 kg/m2  Wt Readings from Last 3 Encounters:  02/17/16 123 lb 3.2 oz (55.883 kg)  01/10/16 126 lb 6.4 oz (57.335 kg)  08/27/15 129 lb (58.514 kg)    Physical Exam  Constitutional: She is oriented to person, place, and time. She appears well-developed and well-nourished. No distress.  Eyes: Conjunctivae and EOM are normal. Pupils are equal, round, and reactive to light.  Cardiovascular: Normal rate, regular rhythm, normal heart sounds and intact distal pulses.   No murmur heard. Pulmonary/Chest: Effort normal and breath sounds normal. No respiratory distress. She has no wheezes.  Abdominal: Soft. Bowel sounds are normal. She exhibits no distension. There is no hepatosplenomegaly. There is tenderness in the epigastric area. There is no rigidity, no rebound, no guarding, no CVA tenderness, no tenderness at McBurney's point and negative Murphy's sign.  Musculoskeletal: Normal range of motion. She exhibits no edema or tenderness.  Neurological: She is alert and oriented to person, place, and time. Coordination normal.  Skin: Skin is warm and dry. No rash noted. She is not diaphoretic.  Psychiatric: She has a normal mood and affect. Her behavior is normal.  Nursing note and vitals reviewed.   Urinalysis: Greater than 30 WBCs, many bacteria, nitrite positive, 2+ blood    Assessment & Plan:   Problem List Items Addressed This Visit    None    Visit Diagnoses    Acute cystitis without hematuria        Relevant Medications    sulfamethoxazole-trimethoprim (BACTRIM DS,SEPTRA DS) 800-160 MG tablet    Other Relevant Orders    Urinalysis, Complete    Gastroesophageal reflux disease without esophagitis         Relevant Medications    omeprazole (PRILOSEC) 20 MG capsule        Follow up plan: Return if symptoms worsen or fail to improve.  Counseling provided for all of the vaccine components Orders Placed This Encounter  Procedures  . Urinalysis, Complete    Caryl Pina, MD [facility] 02/17/2016, 10:01 AM

## 2016-02-24 ENCOUNTER — Other Ambulatory Visit: Payer: Self-pay | Admitting: Cardiovascular Disease

## 2016-02-24 DIAGNOSIS — I6523 Occlusion and stenosis of bilateral carotid arteries: Secondary | ICD-10-CM

## 2016-02-29 ENCOUNTER — Encounter (HOSPITAL_COMMUNITY): Payer: Self-pay

## 2016-02-29 ENCOUNTER — Emergency Department (HOSPITAL_COMMUNITY): Payer: Medicare Other

## 2016-02-29 ENCOUNTER — Emergency Department (HOSPITAL_COMMUNITY)
Admission: EM | Admit: 2016-02-29 | Discharge: 2016-02-29 | Disposition: A | Discharge status: Home / Self Care | Payer: Medicare Other | Attending: Emergency Medicine | Admitting: Emergency Medicine

## 2016-02-29 DIAGNOSIS — Z9049 Acquired absence of other specified parts of digestive tract: Secondary | ICD-10-CM | POA: Insufficient documentation

## 2016-02-29 DIAGNOSIS — K8500 Idiopathic acute pancreatitis without necrosis or infection: Secondary | ICD-10-CM | POA: Insufficient documentation

## 2016-02-29 DIAGNOSIS — R011 Cardiac murmur, unspecified: Secondary | ICD-10-CM | POA: Insufficient documentation

## 2016-02-29 DIAGNOSIS — Z7951 Long term (current) use of inhaled steroids: Secondary | ICD-10-CM | POA: Insufficient documentation

## 2016-02-29 DIAGNOSIS — F17210 Nicotine dependence, cigarettes, uncomplicated: Secondary | ICD-10-CM | POA: Insufficient documentation

## 2016-02-29 DIAGNOSIS — Z79899 Other long term (current) drug therapy: Secondary | ICD-10-CM | POA: Diagnosis not present

## 2016-02-29 DIAGNOSIS — Z7984 Long term (current) use of oral hypoglycemic drugs: Secondary | ICD-10-CM | POA: Diagnosis not present

## 2016-02-29 DIAGNOSIS — Z8551 Personal history of malignant neoplasm of bladder: Secondary | ICD-10-CM | POA: Insufficient documentation

## 2016-02-29 DIAGNOSIS — R1011 Right upper quadrant pain: Secondary | ICD-10-CM | POA: Diagnosis not present

## 2016-02-29 DIAGNOSIS — E785 Hyperlipidemia, unspecified: Secondary | ICD-10-CM | POA: Insufficient documentation

## 2016-02-29 DIAGNOSIS — M810 Age-related osteoporosis without current pathological fracture: Secondary | ICD-10-CM | POA: Insufficient documentation

## 2016-02-29 DIAGNOSIS — Z8679 Personal history of other diseases of the circulatory system: Secondary | ICD-10-CM | POA: Diagnosis not present

## 2016-02-29 DIAGNOSIS — Z7952 Long term (current) use of systemic steroids: Secondary | ICD-10-CM | POA: Insufficient documentation

## 2016-02-29 DIAGNOSIS — Z90710 Acquired absence of both cervix and uterus: Secondary | ICD-10-CM | POA: Insufficient documentation

## 2016-02-29 DIAGNOSIS — E119 Type 2 diabetes mellitus without complications: Secondary | ICD-10-CM | POA: Diagnosis not present

## 2016-02-29 DIAGNOSIS — R1013 Epigastric pain: Secondary | ICD-10-CM | POA: Diagnosis not present

## 2016-02-29 DIAGNOSIS — R109 Unspecified abdominal pain: Secondary | ICD-10-CM | POA: Diagnosis not present

## 2016-02-29 DIAGNOSIS — Z792 Long term (current) use of antibiotics: Secondary | ICD-10-CM | POA: Diagnosis not present

## 2016-02-29 LAB — COMPREHENSIVE METABOLIC PANEL
ALT: 14 U/L (ref 14–54)
AST: 16 U/L (ref 15–41)
Albumin: 3.9 g/dL (ref 3.5–5.0)
Alkaline Phosphatase: 53 U/L (ref 38–126)
Anion gap: 10 (ref 5–15)
BUN: 12 mg/dL (ref 6–20)
CO2: 28 mmol/L (ref 22–32)
Calcium: 9.5 mg/dL (ref 8.9–10.3)
Chloride: 99 mmol/L — ABNORMAL LOW (ref 101–111)
Creatinine, Ser: 0.87 mg/dL (ref 0.44–1.00)
GFR calc Af Amer: >60 mL/min (ref >60)
GFR calc non Af Amer: >60 mL/min (ref >60)
Glucose, Bld: 306 mg/dL — ABNORMAL HIGH (ref 65–99)
Potassium: 4.1 mmol/L (ref 3.5–5.1)
Sodium: 137 mmol/L (ref 135–145)
Total Bilirubin: 0.7 mg/dL (ref 0.3–1.2)
Total Protein: 7.3 g/dL (ref 6.5–8.1)

## 2016-02-29 LAB — CBC
HCT: 43 % (ref 36.0–46.0)
Hemoglobin: 14.6 g/dL (ref 12.0–15.0)
MCH: 33 pg (ref 26.0–34.0)
MCHC: 34 g/dL (ref 30.0–36.0)
MCV: 97.3 fL (ref 78.0–100.0)
Platelets: 241 10*3/uL (ref 150–400)
RBC: 4.42 MIL/uL (ref 3.87–5.11)
RDW: 12.6 % (ref 11.5–15.5)
WBC: 9.6 10*3/uL (ref 4.0–10.5)

## 2016-02-29 LAB — LIPASE, BLOOD: Lipase: 132 U/L — ABNORMAL HIGH (ref 11–51)

## 2016-02-29 LAB — URINALYSIS, ROUTINE W REFLEX MICROSCOPIC
Glucose, UA: NEGATIVE mg/dL
Hgb urine dipstick: NEGATIVE
Ketones, ur: NEGATIVE mg/dL
Leukocytes, UA: NEGATIVE
Nitrite: NEGATIVE
Protein, ur: NEGATIVE mg/dL
Specific Gravity, Urine: 1.017 (ref 1.005–1.030)
pH: 5 (ref 5.0–8.0)

## 2016-02-29 LAB — LACTATE DEHYDROGENASE: LDH: 179 U/L (ref 98–192)

## 2016-02-29 LAB — TROPONIN I: Troponin I: <0.03 ng/mL (ref <0.031)

## 2016-02-29 LAB — CBG MONITORING, ED: Glucose-Capillary: 194 mg/dL — ABNORMAL HIGH (ref 65–99)

## 2016-02-29 MED ORDER — HYDROCODONE-ACETAMINOPHEN 5-325 MG PO TABS: 1.0000 | ORAL_TABLET | ORAL | Status: DC | PRN

## 2016-02-29 MED ORDER — GI COCKTAIL ~~LOC~~
30.0000 mL | Freq: Once | ORAL | Status: AC
  Administered 2016-02-29: 30 mL via ORAL
  Filled 2016-02-29: qty 30

## 2016-02-29 MED ORDER — FAMOTIDINE IN NACL 20-0.9 MG/50ML-% IV SOLN
20.0000 mg | Freq: Once | INTRAVENOUS | Status: AC
  Administered 2016-02-29: 20 mg via INTRAVENOUS
  Filled 2016-02-29: qty 50

## 2016-02-29 MED ORDER — SODIUM CHLORIDE 0.9 % IV SOLN: Freq: Once | INTRAVENOUS | Status: DC

## 2016-02-29 MED ORDER — MORPHINE SULFATE (PF) 4 MG/ML IV SOLN: 4.0000 mg | Freq: Once | INTRAVENOUS | Status: DC

## 2016-02-29 MED ORDER — ONDANSETRON HCL 4 MG PO TABS: 4.0000 mg | ORAL_TABLET | Freq: Four times a day (QID) | ORAL | Status: DC

## 2016-02-29 MED ORDER — SODIUM CHLORIDE 0.9 % IV BOLUS (SEPSIS)
1000.0000 mL | Freq: Once | INTRAVENOUS | Status: AC
  Administered 2016-02-29: 1000 mL via INTRAVENOUS

## 2016-02-29 NOTE — ED Notes (Signed)
Placed patient into gown

## 2016-02-29 NOTE — ED Notes (Signed)
Patient complains of 10 days of epigastric pain and around umbilicus. Has been taken her hiatal hernia medication with no relief. Nausea without vomiting

## 2016-02-29 NOTE — ED Provider Notes (Signed)
CSN: [id number]     Arrival date & time 02/29/16  0916 History   First MD Initiated Contact with Patient 02/29/16 1033     Chief Complaint  Patient presents with  . epigastric pain      (Consider location/radiation/quality/duration/timing/severity/associated sxs/prior Treatment) HPI Comments: Patient reports 10 day history of epigastric and right upper quadrant pain. She appears feels this could be acid reflux. She saw her PCP on May 25 was started on omeprazole without much change. She has nausea but no vomiting. She denies any fever, chills, chest pain or shortness of breath. Has had alternating diarrhea and constipation without blood in stool. Reports history of hiatal hernia. She's never had an EGD. She denies any excessive alcohol or NSAID use. Denies any cardiac history. She was also treated by her PCP for UTI with Bactrim but only completed 5 of 10 days due to severe nausea from the medication. She denies any urinary symptoms today  The history is provided by the patient and a relative.    Past Medical History  Diagnosis Date  . Heart murmur   . Mitral valve prolapse   . Dyslipidemia   . Bladder cancer ([place])   . Pseudogout   . Diabetes mellitus     Type II  . PVD (peripheral vascular disease) ([place])   . Abdominal bruit   . Carotid bruit   . Osteoporosis    Past Surgical History  Procedure Laterality Date  . Laparoscopic cholecystectomy  2009  . Appendectomy    . Hysterectomy-type unspecified     Family History  Problem Relation Age of Onset  . Heart failure Mother   . Coronary artery disease Neg Hx     Early   Social History  Substance Use Topics  . Smoking status: Current Every Day Smoker -- 1.00 packs/day for 30 years    Types: Cigarettes  . Smokeless tobacco: Never Used  . Alcohol Use: No   OB History    No data available     Review of Systems  Constitutional: Positive for activity change and appetite change. Negative for fever and fatigue.  HENT:  Negative for congestion and rhinorrhea.   Respiratory: Negative for cough, chest tightness and shortness of breath.   Cardiovascular: Negative for chest pain.  Gastrointestinal: Positive for nausea, abdominal pain and diarrhea. Negative for vomiting.  Genitourinary: Negative for dysuria, hematuria, vaginal bleeding and vaginal discharge.  Musculoskeletal: Negative for arthralgias and neck pain.  Skin: Negative for rash.  Neurological: Negative for dizziness, weakness and headaches.  A complete 10 system review of systems was obtained and all systems are negative except as noted in the HPI and PMH.      Allergies  Contrast media; Iohexol; Metformin; Pravastatin; and Rofecoxib  Home Medications   Prior to Admission medications   Medication Sig Start Date End Date Taking? Authorizing Provider  B Complex Vitamins (VITAMIN B COMPLEX PO) Take by mouth daily.    Historical Provider, MD  canagliflozin (INVOKANA) 100 MG TABS tablet Take 1 tablet (100 mg total) by mouth daily before breakfast. For diabetes 01/10/16   Claretta Fraise, MD  carbamide peroxide [facility]) 6.5 % otic solution Use once daily in each ear. About 5 drops in one year while laying with that side. After about 10 minutes turned over to the other side and put 5 drops in that ear and lay with that ear for about 10 minutes. Daily for 10 days then weekly 12/21/14   Claretta Fraise, MD  Cholecalciferol  1000 UNITS tablet Take 1 tablet (1,000 Units total) by mouth daily. 12/29/13   Deneise Lever, MD  clobetasol ointment (TEMOVATE) [id number] % Apply 1 application topically 2 (two) times daily. 09/03/15   Claretta Fraise, MD  ezetimibe (ZETIA) 10 MG tablet Take 1 tablet (10 mg total) by mouth daily. For cholesterol 01/10/16   Claretta Fraise, MD  Fluticasone-Salmeterol (ADVAIR DISKUS) 250-50 MCG/DOSE AEPB Inhale 1 puff into the lungs 2 (two) times daily. 01/01/15   Claretta Fraise, MD  furosemide (LASIX) 20 MG tablet Take 1 tablet (20 mg total) by mouth as  needed. 06/09/14   Mary-Margaret Hassell Done, FNP  glimepiride (AMARYL) 4 MG tablet TAKE (1) TABLET DAILY AS DIRECTED. 09/02/15   Claretta Fraise, MD  glucose blood test strip Check blood glucose twice daily and prn 11/16/15   Claretta Fraise, MD  ibuprofen (ADVIL,MOTRIN) 200 MG tablet Take 200 mg by mouth as needed.      Historical Provider, MD  omeprazole (PRILOSEC) 20 MG capsule Take 1 capsule (20 mg total) by mouth daily. 02/17/16   Fransisca Kaufmann Dettinger, MD  sulfamethoxazole-trimethoprim (BACTRIM DS,SEPTRA DS) 800-160 MG tablet Take 1 tablet by mouth 2 (two) times daily. 02/17/16   Fransisca Kaufmann Dettinger, MD  Vitamin D, Ergocalciferol, (DRISDOL) 50000 units CAPS capsule Take 1 capsule (50,000 Units total) by mouth every 7 (seven) days. 01/10/16   Claretta Fraise, MD   BP 161/80 mmHg  Pulse 96  Temp(Src) 98 F (36.7 C) (Oral)  Resp 16  SpO2 95% Physical Exam  Constitutional: She is oriented to person, place, and time. She appears well-developed and well-nourished. No distress.  HENT:  Head: Normocephalic and atraumatic.  Mouth/Throat: Oropharynx is clear and moist. No oropharyngeal exudate.  Eyes: Conjunctivae and EOM are normal. Pupils are equal, round, and reactive to light.  Neck: Normal range of motion. Neck supple.  No meningismus.  Cardiovascular: Normal rate, regular rhythm, normal heart sounds and intact distal pulses.   No murmur heard. Pulmonary/Chest: Effort normal and breath sounds normal. No respiratory distress.  Abdominal: Soft. There is tenderness. There is no rebound and no guarding.  Epigastric, RUQ tenderness. No guarding or rebound  Musculoskeletal: Normal range of motion. She exhibits no edema or tenderness.  Neurological: She is alert and oriented to person, place, and time. No cranial nerve deficit. She exhibits normal muscle tone. Coordination normal.  No ataxia on finger to nose bilaterally. No pronator drift. 5/5 strength throughout. CN 2-12 intact.Equal grip strength. Sensation  intact.   Skin: Skin is warm.  Psychiatric: She has a normal mood and affect. Her behavior is normal.  Nursing note and vitals reviewed.   ED Course  Procedures (including critical care time) Labs Review Labs Reviewed  LIPASE, BLOOD - Abnormal; Notable for the following:    Lipase 132 (*)    All other components within normal limits  COMPREHENSIVE METABOLIC PANEL - Abnormal; Notable for the following:    Chloride 99 (*)    Glucose, Bld 306 (*)    All other components within normal limits  URINALYSIS, ROUTINE W REFLEX MICROSCOPIC (NOT AT [facility]) - Abnormal; Notable for the following:    Bilirubin Urine SMALL (*)    All other components within normal limits  CBG MONITORING, ED - Abnormal; Notable for the following:    Glucose-Capillary 194 (*)    All other components within normal limits  CBC  TROPONIN I  LACTATE DEHYDROGENASE    Imaging Review Ct Abdomen Pelvis Wo Contrast  02/29/2016  CLINICAL  DATA:  75 year old female with right-sided abdominal pain and nausea for 10 days. EXAM: CT ABDOMEN AND PELVIS WITHOUT CONTRAST TECHNIQUE: Multidetector CT imaging of the abdomen and pelvis was performed following the standard protocol without IV contrast. COMPARISON:  None. FINDINGS: Please note that parenchymal abnormalities may be missed without intravenous contrast. Lower chest:  No acute abnormality Hepatobiliary: The liver is unremarkable. The patient is status post cholecystectomy. There is no evidence of biliary dilatation. Pancreas: Mild inflammation along the pancreatic head is compatible acute pancreatitis. There is no evidence of focal collection. Spleen: Unremarkable Adrenals/Urinary Tract: No urinary calculi or hydronephrosis noted. The adrenal glands and bladder are unremarkable. Stomach/Bowel: There is no evidence of bowel obstruction or definite focal bowel wall thickening. Vascular/Lymphatic: Aortic atherosclerotic calcifications noted without aneurysm. No enlarged lymph nodes  identified. Reproductive: The patient is status post hysterectomy. No adnexal masses identified. Other: No free fluid or pneumoperitoneum noted. Musculoskeletal: No acute or suspicious abnormalities. IMPRESSION: Mild inflammation along the pancreatic head compatible with acute pancreatitis. No focal collection. No other acute abnormalities. Aortic atherosclerosis without aneurysm. Electronically Signed   By: Margarette Canada M.D.   On: 02/29/2016 12:00   I have personally reviewed and evaluated these images and lab results as part of my medical decision-making.   EKG Interpretation   Date/Time:  Tuesday February 29 2016 09:46:46 EDT Ventricular Rate:  104 PR Interval:  134 QRS Duration: 76 QT Interval:  336 QTC Calculation: 441 R Axis:   50 Text Interpretation:  Sinus tachycardia Anteroseptal infarct , age  undetermined Abnormal ECG No significant change was found Confirmed by  Wyvonnia Dusky  MD, Day Greb [phone]) on 02/29/2016 10:53:13 AM      MDM   Final diagnoses:  Idiopathic acute pancreatitis   10 days of epigastric and right upper quadrant pain with nausea. Previous cholecystectomy.  Epigastric and right upper quadrant pain on exam. LFTs are normal. Lipase mildly elevated at 132. This is similar to 2008.  CT scan shows mild pancreatitis. No abscess or pseudocyst.  Patient appears well. Her pain is controlled. She declines pain medication ED. She is tolerating liquids. Discussed inpatient versus outpatient management with the patient. She wishes to go home. This seems reasonable given her mild pancreatitis and her well appearance. RAnson score is 2.  She has invokana and zetia on her medication List which are known to cause pancreatitis. She states however she is not taking these. She also previously stop Bactrim.  Advised to follow-up with PCP in 2 days. She may need further medication adjustments. Return to the ED with worsening pain, fever, vomiting or any other concerns. She states she will  return with worsening symptoms and reiterates her desire to go home.  BP 156/92 mmHg  Pulse 84  Temp(Src) 98 F (36.7 C) (Oral)  Resp 21  SpO2 97%   Ezequiel Essex, MD 02/29/16 [phone]

## 2016-02-29 NOTE — Discharge Instructions (Signed)
Acute Pancreatitis Follow a clear liquid diet as we discussed. Stop taking your invokana and zetia. Take the pain and nausea medication as prescribed. Follow up with your doctor. Return to the ED if you develop worsening pain, vomiting, fever, or any other concerns. Acute pancreatitis is a disease in which the pancreas becomes suddenly inflamed. The pancreas is a large gland located behind your stomach. The pancreas produces enzymes that help digest food. The pancreas also releases the hormones glucagon and insulin that help regulate blood sugar. Damage to the pancreas occurs when the digestive enzymes from the pancreas are activated and begin attacking the pancreas before being released into the intestine. Most acute attacks last a couple of days and can cause serious complications. Some people become dehydrated and develop low blood pressure. In severe cases, bleeding into the pancreas can lead to shock and can be life-threatening. The lungs, heart, and kidneys may fail. CAUSES  Pancreatitis can happen to anyone. In some cases, the cause is unknown. Most cases are caused by:  Alcohol abuse.  Gallstones. Other less common causes are:  Certain medicines.  Exposure to certain chemicals.  Infection.  Damage caused by an accident (trauma).  Abdominal surgery. SYMPTOMS   Pain in the upper abdomen that may radiate to the back.  Tenderness and swelling of the abdomen.  Nausea and vomiting. DIAGNOSIS  Your caregiver will perform a physical exam. Blood and stool tests may be done to confirm the diagnosis. Imaging tests may also be done, such as X-rays, CT scans, or an ultrasound of the abdomen. TREATMENT  Treatment usually requires a stay in the hospital. Treatment may include:  Pain medicine.  Fluid replacement through an intravenous line (IV).  Placing a tube in the stomach to remove stomach contents and control vomiting.  Not eating for 3 or 4 days. This gives your pancreas a rest,  because enzymes are not being produced that can cause further damage.  Antibiotic medicines if your condition is caused by an infection.  Surgery of the pancreas or gallbladder. HOME CARE INSTRUCTIONS   Follow the diet advised by your caregiver. This may involve avoiding alcohol and decreasing the amount of fat in your diet.  Eat smaller, more frequent meals. This reduces the amount of digestive juices the pancreas produces.  Drink enough fluids to keep your urine clear or pale yellow.  Only take over-the-counter or prescription medicines as directed by your caregiver.  Avoid drinking alcohol if it caused your condition.  Do not smoke.  Get plenty of rest.  Check your blood sugar at home as directed by your caregiver.  Keep all follow-up appointments as directed by your caregiver. SEEK MEDICAL CARE IF:   You do not recover as quickly as expected.  You develop new or worsening symptoms.  You have persistent pain, weakness, or nausea.  You recover and then have another episode of pain. SEEK IMMEDIATE MEDICAL CARE IF:   You are unable to eat or keep fluids down.  Your pain becomes severe.  You have a fever or persistent symptoms for more than 2 to 3 days.  You have a fever and your symptoms suddenly get worse.  Your skin or the white part of your eyes turn yellow (jaundice).  You develop vomiting.  You feel dizzy, or you faint.  Your blood sugar is high (over 300 mg/dL). MAKE SURE YOU:   Understand these instructions.  Will watch your condition.  Will get help right away if you are not doing well  or get worse.   This information is not intended to replace advice given to you by your health care provider. Make sure you discuss any questions you have with your health care provider.   Document Released: 09/11/2005 Document Revised: 03/12/2012 Document Reviewed: 12/21/2011 Elsevier Interactive Patient Education [employer].

## 2016-02-29 NOTE — ED Notes (Signed)
Pt transporting to CT  

## 2016-02-29 NOTE — ED Notes (Signed)
Gave patient water went down well

## 2016-03-02 ENCOUNTER — Ambulatory Visit (INDEPENDENT_AMBULATORY_CARE_PROVIDER_SITE_OTHER): Payer: Medicare Other | Admitting: Family Medicine

## 2016-03-02 ENCOUNTER — Encounter: Payer: Self-pay | Admitting: Family Medicine

## 2016-03-02 VITALS — BP 137/80 | HR 84 | Temp 98.0°F | Ht 61.5 in | Wt 122.0 lb

## 2016-03-02 DIAGNOSIS — K859 Acute pancreatitis, unspecified: Secondary | ICD-10-CM

## 2016-03-02 DIAGNOSIS — K8500 Idiopathic acute pancreatitis without necrosis or infection: Secondary | ICD-10-CM | POA: Diagnosis not present

## 2016-03-02 NOTE — Progress Notes (Signed)
BP 137/80 mmHg  Pulse 84  Temp(Src) 98 F (36.7 C) (Oral)  Ht 5' 1.5" (1.562 m)  Wt 122 lb (55.339 kg)  BMI 22.68 kg/m2   Subjective:    Patient ID: Hannah French, female    DOB: 1941/04/25, 75 y.o.   MRN: [id number]  HPI: Hannah French is a 75 y.o. female presenting on 03/02/2016 for ER followup   HPI Hospital follow-up/pancreatitis Patient is coming in today for a hospital follow-up for acute pancreatitis. This is the first time she has had this. She was admitted on 02/29/2016 and spent the night emergency department. While she was there she did have a CT which showed that she had pancreatitis. She has already had her gallbladder removed previously and did not show any dilation of the bile duct. She was having abdominal pain that went straight through to her back but today she is only having a mild epigastric abdominal pain with no back pain. They stuck her on a liquid diet and she has been following that and wonders when she can start increasing things slowly. She denies any fevers or chills or constipation or nausea or vomiting.  Relevant past medical, surgical, family and social history reviewed and updated as indicated. Interim medical history since our last visit reviewed. Allergies and medications reviewed and updated.  Review of Systems  Constitutional: Negative for fever and chills.  HENT: Negative for congestion, ear discharge and ear pain.   Eyes: Negative for redness and visual disturbance.  Respiratory: Negative for chest tightness and shortness of breath.   Cardiovascular: Negative for chest pain and leg swelling.  Gastrointestinal: Positive for abdominal pain. Negative for nausea, vomiting, diarrhea, constipation, blood in stool, abdominal distention and anal bleeding.  Genitourinary: Negative for dysuria and difficulty urinating.  Musculoskeletal: Negative for back pain and gait problem.  Skin: Negative for rash.  Neurological: Negative for light-headedness and  headaches.  Psychiatric/Behavioral: Negative for behavioral problems and agitation.  All other systems reviewed and are negative.   Per HPI unless specifically indicated above     Medication List       This list is accurate as of: 03/02/16 11:10 AM.  Always use your most recent med list.               carbamide peroxide 6.5 % otic solution  Commonly known as:  DEBROX  Use once daily in each ear. About 5 drops in one year while laying with that side. After about 10 minutes turned over to the other side and put 5 drops in that ear and lay with that ear for about 10 minutes. Daily for 10 days then weekly     Fluticasone-Salmeterol 250-50 MCG/DOSE Aepb  Commonly known as:  ADVAIR DISKUS  Inhale 1 puff into the lungs 2 (two) times daily.     furosemide 20 MG tablet  Commonly known as:  LASIX  Take 1 tablet (20 mg total) by mouth as needed.     glimepiride 4 MG tablet  Commonly known as:  AMARYL  TAKE (1) TABLET DAILY AS DIRECTED.     glucose blood test strip  Check blood glucose twice daily and prn     HYDROcodone-acetaminophen 5-325 MG tablet  Commonly known as:  NORCO/VICODIN  Take 1 tablet by mouth every 4 (four) hours as needed.     ibuprofen 200 MG tablet  Commonly known as:  ADVIL,MOTRIN  Take 200 mg by mouth as needed.     omeprazole 20 MG  capsule  Commonly known as:  PRILOSEC  Take 1 capsule (20 mg total) by mouth daily.     ondansetron 4 MG tablet  Commonly known as:  ZOFRAN  Take 1 tablet (4 mg total) by mouth every 6 (six) hours.     VITAMIN B COMPLEX PO  Take by mouth daily.     Vitamin D (Ergocalciferol) 50000 units Caps capsule  Commonly known as:  DRISDOL  Take 1 capsule (50,000 Units total) by mouth every 7 (seven) days.           Objective:    BP 137/80 mmHg  Pulse 84  Temp(Src) 98 F (36.7 C) (Oral)  Ht 5' 1.5" (1.562 m)  Wt 122 lb (55.339 kg)  BMI 22.68 kg/m2  Wt Readings from Last 3 Encounters:  03/02/16 122 lb (55.339 kg)    02/17/16 123 lb 3.2 oz (55.883 kg)  01/10/16 126 lb 6.4 oz (57.335 kg)    Physical Exam  Constitutional: She is oriented to person, place, and time. She appears well-developed and well-nourished. No distress.  Eyes: Conjunctivae and EOM are normal. Pupils are equal, round, and reactive to light.  Cardiovascular: Normal rate, regular rhythm, normal heart sounds and intact distal pulses.   No murmur heard. Pulmonary/Chest: Effort normal and breath sounds normal. No respiratory distress. She has no wheezes.  Abdominal: Soft. Bowel sounds are normal. She exhibits no distension and no mass. There is tenderness (Mild epigastric tenderness). There is no rebound and no guarding.  Musculoskeletal: Normal range of motion. She exhibits no edema or tenderness.  Neurological: She is alert and oriented to person, place, and time. Coordination normal.  Skin: Skin is warm and dry. No rash noted. She is not diaphoretic.  Psychiatric: She has a normal mood and affect. Her behavior is normal.  Nursing note and vitals reviewed.   Results for orders placed or performed during the hospital encounter of 02/29/16  Lipase, blood  Result Value Ref Range   Lipase 132 (H) 11 - 51 U/L  Comprehensive metabolic panel  Result Value Ref Range   Sodium 137 135 - 145 mmol/L   Potassium 4.1 3.5 - 5.1 mmol/L   Chloride 99 (L) 101 - 111 mmol/L   CO2 28 22 - 32 mmol/L   Glucose, Bld 306 (H) 65 - 99 mg/dL   BUN 12 6 - 20 mg/dL   Creatinine, Ser 0.87 0.44 - 1.00 mg/dL   Calcium 9.5 8.9 - 10.3 mg/dL   Total Protein 7.3 6.5 - 8.1 g/dL   Albumin 3.9 3.5 - 5.0 g/dL   AST 16 15 - 41 U/L   ALT 14 14 - 54 U/L   Alkaline Phosphatase 53 38 - 126 U/L   Total Bilirubin 0.7 0.3 - 1.2 mg/dL   GFR calc non Af Amer >60 >60 mL/min   GFR calc Af Amer >60 >60 mL/min   Anion gap 10 5 - 15  CBC  Result Value Ref Range   WBC 9.6 4.0 - 10.5 K/uL   RBC 4.42 3.87 - 5.11 MIL/uL   Hemoglobin 14.6 12.0 - 15.0 g/dL   HCT 43.0 36.0 - 46.0  %   MCV 97.3 78.0 - 100.0 fL   MCH 33.0 26.0 - 34.0 pg   MCHC 34.0 30.0 - 36.0 g/dL   RDW 12.6 11.5 - 15.5 %   Platelets 241 150 - 400 K/uL  Urinalysis, Routine w reflex microscopic  Result Value Ref Range   Color, Urine YELLOW YELLOW  APPearance CLEAR CLEAR   Specific Gravity, Urine 1.017 1.005 - 1.030   pH 5.0 5.0 - 8.0   Glucose, UA NEGATIVE NEGATIVE mg/dL   Hgb urine dipstick NEGATIVE NEGATIVE   Bilirubin Urine SMALL (A) NEGATIVE   Ketones, ur NEGATIVE NEGATIVE mg/dL   Protein, ur NEGATIVE NEGATIVE mg/dL   Nitrite NEGATIVE NEGATIVE   Leukocytes, UA NEGATIVE NEGATIVE  Troponin I  Result Value Ref Range   Troponin I <0.03 <0.031 ng/mL  Lactate dehydrogenase  Result Value Ref Range   LDH 179 98 - 192 U/L  CBG monitoring, ED  Result Value Ref Range   Glucose-Capillary 194 (H) 65 - 99 mg/dL      Assessment & Plan:   Problem List Items Addressed This Visit    None    Visit Diagnoses    Acute pancreatitis, unspecified pancreatitis type    -  Primary    Follow liquid and low fat diet and slowly increase.        Follow up plan: Return if symptoms worsen or fail to improve.  Counseling provided for all of the vaccine components No orders of the defined types were placed in this encounter.    Caryl Pina, MD [facility] 03/02/2016, 11:10 AM

## 2016-03-02 NOTE — Patient Instructions (Signed)
Low-Fat Diet for Pancreatitis or Gallbladder Conditions °A low-fat diet can be helpful if you have pancreatitis or a gallbladder condition. With these conditions, your pancreas and gallbladder have trouble digesting fats. A healthy eating plan with less fat will help rest your pancreas and gallbladder and reduce your symptoms. °WHAT DO I NEED TO KNOW ABOUT THIS DIET? °· Eat a low-fat diet. °¨ Reduce your fat intake to less than 20-30% of your total daily calories. This is less than 50-60 g of fat per day. °¨ Remember that you need some fat in your diet. Ask your dietician what your daily goal should be. °¨ Choose nonfat and low-fat healthy foods. Look for the words "nonfat," "low fat," or "fat free." °¨ As a guide, look on the label and choose foods with less than 3 g of fat per serving. Eat only one serving. °· Avoid alcohol. °· Do not smoke. If you need help quitting, talk with your health care provider. °· Eat small frequent meals instead of three large heavy meals. °WHAT FOODS CAN I EAT? °Grains °Include healthy grains and starches such as potatoes, wheat bread, fiber-rich cereal, and brown rice. Choose whole grain options whenever possible. In adults, whole grains should account for 45-65% of your daily calories.  °Fruits and Vegetables °Eat plenty of fruits and vegetables. Fresh fruits and vegetables add fiber to your diet. °Meats and Other Protein Sources °Eat lean meat such as chicken and pork. Trim any fat off of meat before cooking it. Eggs, fish, and beans are other sources of protein. In adults, these foods should account for 10-35% of your daily calories. °Dairy °Choose low-fat milk and dairy options. Dairy includes fat and protein, as well as calcium.  °Fats and Oils °Limit high-fat foods such as fried foods, sweets, baked goods, sugary drinks.  °Other °Creamy sauces and condiments, such as mayonnaise, can add extra fat. Think about whether or not you need to use them, or use smaller amounts or low fat  options. °WHAT FOODS ARE NOT RECOMMENDED? °· High fat foods, such as: °¨ Baked goods. °¨ Ice cream. °¨ French toast. °¨ Sweet rolls. °¨ Pizza. °¨ Cheese bread. °¨ Foods covered with batter, butter, creamy sauces, or cheese. °¨ Fried foods. °¨ Sugary drinks and desserts. °· Foods that cause gas or bloating °  °This information is not intended to replace advice given to you by your health care provider. Make sure you discuss any questions you have with your health care provider. °  °Document Released: 09/16/2013 Document Reviewed: 09/16/2013 °Elsevier Interactive Patient Education ©2016 [employer] ° °

## 2016-03-03 ENCOUNTER — Ambulatory Visit (HOSPITAL_COMMUNITY)
Admission: RE | Admit: 2016-03-03 | Discharge: 2016-03-03 | Disposition: A | Discharge status: Home / Self Care | Payer: Medicare Other | Source: Ambulatory Visit | Attending: Cardiovascular Disease | Admitting: Cardiovascular Disease

## 2016-03-03 DIAGNOSIS — I700 Atherosclerosis of aorta: Secondary | ICD-10-CM | POA: Insufficient documentation

## 2016-03-03 DIAGNOSIS — I739 Peripheral vascular disease, unspecified: Secondary | ICD-10-CM | POA: Diagnosis not present

## 2016-03-03 DIAGNOSIS — I6523 Occlusion and stenosis of bilateral carotid arteries: Secondary | ICD-10-CM | POA: Diagnosis not present

## 2016-03-03 DIAGNOSIS — E1151 Type 2 diabetes mellitus with diabetic peripheral angiopathy without gangrene: Secondary | ICD-10-CM | POA: Diagnosis not present

## 2016-03-03 DIAGNOSIS — I70203 Unspecified atherosclerosis of native arteries of extremities, bilateral legs: Secondary | ICD-10-CM | POA: Insufficient documentation

## 2016-03-03 DIAGNOSIS — I10 Essential (primary) hypertension: Secondary | ICD-10-CM | POA: Insufficient documentation

## 2016-03-03 DIAGNOSIS — E785 Hyperlipidemia, unspecified: Secondary | ICD-10-CM | POA: Diagnosis not present

## 2016-03-06 ENCOUNTER — Other Ambulatory Visit: Payer: Medicare Other

## 2016-03-06 ENCOUNTER — Telehealth: Payer: Self-pay | Admitting: Family Medicine

## 2016-03-06 DIAGNOSIS — K859 Acute pancreatitis without necrosis or infection, unspecified: Secondary | ICD-10-CM | POA: Diagnosis not present

## 2016-03-06 NOTE — Telephone Encounter (Signed)
Order place and patient will come in today to have blood work.

## 2016-03-06 NOTE — Telephone Encounter (Signed)
The labs for testing whether pinker terraces cleared up or not are not very reliable but if she wants to she can come in and get a lipase drawn and we can see if it's improved from where she was. She was not very high when she went to the hospital on her last one at 132 so I would imagine that she is normal

## 2016-03-07 ENCOUNTER — Ambulatory Visit (HOSPITAL_COMMUNITY): Payer: Medicare Other | Attending: Internal Medicine

## 2016-03-07 ENCOUNTER — Other Ambulatory Visit: Payer: Self-pay

## 2016-03-07 ENCOUNTER — Ambulatory Visit (INDEPENDENT_AMBULATORY_CARE_PROVIDER_SITE_OTHER): Payer: Medicare Other | Admitting: Physician Assistant

## 2016-03-07 ENCOUNTER — Encounter: Payer: Self-pay | Admitting: Physician Assistant

## 2016-03-07 ENCOUNTER — Ambulatory Visit (INDEPENDENT_AMBULATORY_CARE_PROVIDER_SITE_OTHER): Payer: Medicare Other

## 2016-03-07 VITALS — BP 110/90 | HR 93 | Ht 61.5 in | Wt 122.2 lb

## 2016-03-07 DIAGNOSIS — I6523 Occlusion and stenosis of bilateral carotid arteries: Secondary | ICD-10-CM | POA: Diagnosis not present

## 2016-03-07 DIAGNOSIS — R000 Tachycardia, unspecified: Secondary | ICD-10-CM | POA: Diagnosis not present

## 2016-03-07 DIAGNOSIS — I071 Rheumatic tricuspid insufficiency: Secondary | ICD-10-CM | POA: Insufficient documentation

## 2016-03-07 DIAGNOSIS — E119 Type 2 diabetes mellitus without complications: Secondary | ICD-10-CM | POA: Diagnosis not present

## 2016-03-07 DIAGNOSIS — Z720 Tobacco use: Secondary | ICD-10-CM | POA: Insufficient documentation

## 2016-03-07 DIAGNOSIS — I739 Peripheral vascular disease, unspecified: Secondary | ICD-10-CM | POA: Diagnosis not present

## 2016-03-07 DIAGNOSIS — E785 Hyperlipidemia, unspecified: Secondary | ICD-10-CM

## 2016-03-07 DIAGNOSIS — I340 Nonrheumatic mitral (valve) insufficiency: Secondary | ICD-10-CM | POA: Diagnosis not present

## 2016-03-07 LAB — ECHOCARDIOGRAM COMPLETE
E decel time: 151 msec
E/e' ratio: 7.45
FS: 33 % (ref 28–44)
Height: 61.5 in
IVS/LV PW RATIO, ED: 0.77
LA ID, A-P, ES: 30 mm
LA diam end sys: 30 mm
LA diam index: 1.95 cm/m2
LA vol A4C: 22 ml
LA vol index: 22.7 mL/m2
LA vol: 35 mL
LV E/e' medial: 7.45
LV E/e'average: 7.45
LV PW d: 9.24 mm — AB (ref 0.6–1.1)
LV e' LATERAL: 9.54 cm/s
LVOT SV: 54 mL
LVOT VTI: 17.3 cm
LVOT area: 3.14 cm2
LVOT diameter: 20 mm
LVOT peak vel: 79 cm/s
MV Dec: 151
MV Peak grad: 2 mmHg
MV pk A vel: 82.4 m/s
MV pk E vel: 71.1 m/s
Reg peak vel: 212 cm/s
TDI e' lateral: 9.54
TDI e' medial: 8.55
TR max vel: 212 cm/s
Weight: 1955.2 oz

## 2016-03-07 LAB — LIPASE: Lipase: 56 U/L (ref 0–59)

## 2016-03-07 MED ORDER — ASPIRIN EC 81 MG PO TBEC: 81.0000 mg | DELAYED_RELEASE_TABLET | Freq: Every day | ORAL | Status: DC

## 2016-03-07 NOTE — Progress Notes (Addendum)
Cardiology Office Note    Date:  03/07/2016   ID:  Hannah French, DOB 1941/06/05, MRN [id number]  PCP:  Claretta Fraise, MD  Cardiologist: Dr. Burt Knack  Chief complaint rapid heartbeat  History of Present Illness:  Hannah French is a 75 y.o. female patient of Dr. Burt Knack who has significant carotid and lower extremity peripheral arterial disease. She has smoked for 45 years and is not willing to quit. She has been intolerant to statin drugs because of myalgias. She stopped her aspirin because of significant bruising and bleeding. She was recently in the emergency room for acute pancreatitis felt secondary to Bactrim prescribed for a UTI. She is still recovering from that.  She comes in today accompanied by her daughter. She complains of her heart rate racing at 108-110 bpm for sometimes days that time. She only drinks one cup of coffee daily and uses no other caffeine. Sometimes it will settle down and be normal other times it just goes fast. She denies chest pain, dizziness, numbness, tingling or weakness of her extremities. She has chronic dyspnea on exertion. She denies leg claudication symptoms but occasionally her toes will go numb but it is very infrequent. Vascular studies were reviewed by Dr. Burt Knack in 03/07/16 and felt to be stable severe iliac stenosis asymptomatic, stable carotid disease and left subclavian stenosis with steal. Repeat studies in 1 year.     Past Medical History  Diagnosis Date  . Heart murmur   . Mitral valve prolapse   . Dyslipidemia   . Bladder cancer ([facility])   . Pseudogout   . Diabetes mellitus     Type II  . PVD (peripheral vascular disease) ([facility])   . Abdominal bruit   . Carotid bruit   . Osteoporosis   . Pancreatitis, acute     Past Surgical History  Procedure Laterality Date  . Laparoscopic cholecystectomy  2009  . Appendectomy    . Hysterectomy-type unspecified      Current Medications: Outpatient Prescriptions Prior to Visit  Medication  Sig Dispense Refill  . B Complex Vitamins (VITAMIN B COMPLEX PO) Take by mouth daily.    . carbamide peroxide (DEBROX) 6.5 % otic solution Use once daily in each ear. About 5 drops in one year while laying with that side. After about 10 minutes turned over to the other side and put 5 drops in that ear and lay with that ear for about 10 minutes. Daily for 10 days then weekly 15 mL 11  . Fluticasone-Salmeterol (ADVAIR DISKUS) 250-50 MCG/DOSE AEPB Inhale 1 puff into the lungs 2 (two) times daily. 1 each 3  . glimepiride (AMARYL) 4 MG tablet TAKE (1) TABLET DAILY AS DIRECTED. 90 tablet 0  . glucose blood test strip Check blood glucose twice daily and prn 180 each 4  . ibuprofen (ADVIL,MOTRIN) 200 MG tablet Take 200 mg by mouth as needed for headache or moderate pain.     Marland Kitchen omeprazole (PRILOSEC) 20 MG capsule Take 1 capsule (20 mg total) by mouth daily. 30 capsule 3  . ondansetron (ZOFRAN) 4 MG tablet Take 1 tablet (4 mg total) by mouth every 6 (six) hours. 12 tablet 0  . Vitamin D, Ergocalciferol, (DRISDOL) 50000 units CAPS capsule Take 1 capsule (50,000 Units total) by mouth every 7 (seven) days. 12 capsule 3  . furosemide (LASIX) 20 MG tablet Take 1 tablet (20 mg total) by mouth as needed. (Patient not taking: Reported on 03/07/2016) 90 tablet 1  . HYDROcodone-acetaminophen (NORCO/VICODIN) 5-325  MG tablet Take 1 tablet by mouth every 4 (four) hours as needed. (Patient not taking: Reported on 03/02/2016) 10 tablet 0   No facility-administered medications prior to visit.     Allergies:   Bactrim; Contrast media; Iohexol; Metformin; Pravastatin; and Rofecoxib   Social History   Social History  . Marital Status: Divorced    Spouse Name: N/A  . Number of Children: 2  . Years of Education: N/A   Occupational History  . Retired    Social History Main Topics  . Smoking status: Current Every Day Smoker -- 1.00 packs/day for 30 years    Types: Cigarettes  . Smokeless tobacco: Never Used  . Alcohol  Use: No  . Drug Use: No  . Sexual Activity: Not Asked   Other Topics Concern  . None   Social History Narrative     Family History:  [name]    family history includes Heart attack in her brother and maternal uncle; Heart failure in her mother; Hypertension in her brother and sister. There is no history of Coronary artery disease.   ROS:   Please see the history of present illness.    Review of Systems  Cardiovascular: Positive for dyspnea on exertion and palpitations.  Respiratory: Positive for cough and wheezing.   Hematologic/Lymphatic: Bruises/bleeds easily.  Gastrointestinal: Positive for abdominal pain.   All other systems reviewed and are negative.   PHYSICAL EXAM:   VS:  BP 110/90 mmHg  Pulse 93  Ht 5' 1.5" (1.562 m)  Wt 122 lb 3.2 oz (55.43 kg)  BMI 22.72 kg/m2   GEN: Well nourished, well developed, in no acute distress Neck: Bilateral carotid bruits and subclavian bruit no JVD,  or masses Cardiac: RRR; 2/6 systolic murmur at the left sternal border, no rubs, or gallops,no edema cannot Palpate dorsalis pedis or posterior tibial pulses Respiratory: Decreased breath sounds with scattered wheezes  GI: Abdominal aortic bruit, femoral bruits soft, nontender, nondistended, + BS MS: no deformity or atrophy Skin: warm and dry, no rash Neuro:  Alert and Oriented x 3, Strength and sensation are intact Psych: euthymic mood, full affect  Wt Readings from Last 3 Encounters:  03/07/16 122 lb 3.2 oz (55.43 kg)  03/02/16 122 lb (55.339 kg)  02/17/16 123 lb 3.2 oz (55.883 kg)      Studies/Labs Reviewed:   EKG:  EKG is Not ordered today.  The ekg reviewed from the emergency room last week and showed sinus tachycardia at 104 bpm, no acute change  Recent Labs: 02/29/2016: ALT 14; BUN 12; Creatinine, Ser 0.87; Hemoglobin 14.6; Platelets 241; Potassium 4.1; Sodium 137   Lipid Panel    Component Value Date/Time   CHOL 191 01/05/2016 1022   CHOL 204* 06/09/2014 1028    TRIG 89 01/05/2016 1022   TRIG 82 06/09/2014 1028   HDL 55 01/05/2016 1022   HDL 57 06/09/2014 1028   HDL 53 02/06/2013 0932   CHOLHDL 3.5 01/05/2016 1022   CHOLHDL 3.5 02/06/2013 0932   VLDL 20 02/06/2013 0932   LDLCALC 118* 01/05/2016 1022   LDLCALC 131* 06/09/2014 1028   LDLCALC 114* 02/06/2013 0932    Additional studies/ records that were reviewed today include:  Carotid Dopplers 03/03/16 Stable heterogeneous plaque, bilaterally. 1-39% RICA stenosis. 123456 LICA stenosis, velocities have increased. Left subclavian stenosis with steal. Right vertebral artery is patent and antegrade. Left vertebral artery with to and fro flow. f/u 1 year  ABIs 03/03/16  Impressions Known severe bilateral common iliac  artery stenosis. Bilateral ABI's remain within normal range. Normal great toe-brachial indices, bilaterally. Abnormal great toe-brachial indices, bilaterally, although not at risk or tissue loss. See Aorto-iliac Duplex study  Impressions Duplex imaging of the abdominal aorta, common iliac and external iliac arteries reveals them to be of small caliber, without focal dilatation or stenosis. There atherosclerosis noted in the aorta and iliac arteries, bilaterally. Bilateral common iliac artery velocities are elevated and stable as compared to prior study. The IVC is patent. Technologist Notes Small caliper abdominal aorta, common iliac and external iliac arteries without focal stenosis or dilatation. Aorto-iliac atherosclerosis. >50% bilateral common iliac stenosis. The IVC is patent.    Notes Recorded by Sherren Mocha, MD on 03/07/2016 at 6:59 AM Vascular studies reviewed. Stable severe iliac stenosis (pt has been asymptomatic). Stable carotid disease and left subclavian stenosis with steal. Will review at next office visit to assess for change in symptoms. Anticipate repeat studies in one year.   ASSESSMENT:    1. Tachycardia   2. PVD   3. Carotid stenosis, bilateral     4. Tobacco abuse   5. Hyperlipidemia with target LDL less than 70      PLAN:  In order of problems listed above:  Tachycardia patient complains of tachycardia for about a year now. She does not drink excessive caffeine. It could be due to her smoking and COPD. We'll order 48 hour monitor to rule out arrhythmia and 2-D echo  Peripheral vascular disease Dopplers reviewed by Dr. Burt Knack and are stable. Will need repeated in one year. Patient is pretty asymptomatic with it. Patient stopped ASA secondary to easy bleeding. Resume ASA 81 mg every other day. F/U with Dr. Burt Knack 3-4 months.  Carotid stenosis bilateral with subclavian steal asymptomatic repeat Dopplers in 1 year  Tobacco abuse smoking cessation discussed in detail with patient but she is unwilling to quit.  Hyperlipidemia intolerant to statins    Medication Adjustments/Labs and Tests Ordered: Current medicines are reviewed at length with the patient today.  Concerns regarding medicines are outlined above.  Medication changes, Labs and Tests ordered today are listed in the Patient Instructions below. Patient Instructions  Medication Instructions:  Your physician has recommended you make the following change in your medication:  START Aspirin 81mg  daily or every other day as tolerated   Labwork: None ordered  Testing/Procedures: Your physician has recommended that you wear a holter monitor. Holter monitors are medical devices that record the heart's electrical activity. Doctors most often use these monitors to diagnose arrhythmias. Arrhythmias are problems with the speed or rhythm of the heartbeat. The monitor is a small, portable device. You can wear one while you do your normal daily activities. This is usually used to diagnose what is causing palpitations/syncope (passing out).  Your physician has requested that you have an echocardiogram. Echocardiography is a painless test that uses sound waves to create images of your  heart. It provides your doctor with information about the size and shape of your heart and how well your heart's chambers and valves are working. This procedure takes approximately one hour. There are no restrictions for this procedure.     Follow-Up: Your physician recommends that you schedule a follow-up appointment in: 3-4 months with Dr.Cooper   Any Other Special Instructions Will Be Listed Below (If Applicable).     If you need a refill on your cardiac medications before your next appointment, please call your pharmacy.       Signed, Ermalinda Barrios, PA-C  03/07/2016 11:16 AM    [address], [place], [place]  [address] Phone: [phone]; Fax: [phone]

## 2016-03-07 NOTE — Patient Instructions (Signed)
Medication Instructions:  Your physician has recommended you make the following change in your medication:  START Aspirin 81mg  daily or every other day as tolerated   Labwork: None ordered  Testing/Procedures: Your physician has recommended that you wear a holter monitor. Holter monitors are medical devices that record the heart's electrical activity. Doctors most often use these monitors to diagnose arrhythmias. Arrhythmias are problems with the speed or rhythm of the heartbeat. The monitor is a small, portable device. You can wear one while you do your normal daily activities. This is usually used to diagnose what is causing palpitations/syncope (passing out).  Your physician has requested that you have an echocardiogram. Echocardiography is a painless test that uses sound waves to create images of your heart. It provides your doctor with information about the size and shape of your heart and how well your heart's chambers and valves are working. This procedure takes approximately one hour. There are no restrictions for this procedure.     Follow-Up: Your physician recommends that you schedule a follow-up appointment in: 3-4 months with Dr.Cooper   Any Other Special Instructions Will Be Listed Below (If Applicable).     If you need a refill on your cardiac medications before your next appointment, please call your pharmacy.

## 2016-03-22 DIAGNOSIS — D2339 Other benign neoplasm of skin of other parts of face: Secondary | ICD-10-CM | POA: Diagnosis not present

## 2016-03-22 DIAGNOSIS — L821 Other seborrheic keratosis: Secondary | ICD-10-CM | POA: Diagnosis not present

## 2016-03-22 DIAGNOSIS — L565 Disseminated superficial actinic porokeratosis (DSAP): Secondary | ICD-10-CM | POA: Diagnosis not present

## 2016-03-22 DIAGNOSIS — D485 Neoplasm of uncertain behavior of skin: Secondary | ICD-10-CM | POA: Diagnosis not present

## 2016-03-22 DIAGNOSIS — L570 Actinic keratosis: Secondary | ICD-10-CM | POA: Diagnosis not present

## 2016-03-23 ENCOUNTER — Telehealth: Payer: Self-pay | Admitting: Cardiovascular Disease

## 2016-03-23 NOTE — Telephone Encounter (Signed)
New message  Pt is calling for test results of her Holter monitor

## 2016-03-23 NOTE — Telephone Encounter (Signed)
Patient aware that results will be called to her by Dr. Antionette Char nurse when results are done and reviewed by Dr. Burt Knack. Patient verbalized understanding.

## 2016-03-27 MED ORDER — METOPROLOL SUCCINATE ER 25 MG PO TB24: 25.0000 mg | ORAL_TABLET | Freq: Every day | ORAL | Status: DC

## 2016-03-27 NOTE — Telephone Encounter (Signed)
Results are currently in Dr Antionette Char basket for review.

## 2016-03-27 NOTE — Telephone Encounter (Signed)
I spoke with the pt and made her aware of holter monitor results. The pt was given instructions on Metoprolol Succinate and verbalized understanding of instructions in regards to taking one-half tablet for one week and if tolerated then okay to increase to whole tablet daily. Rx sent to the pharmacy. The pt will contact the office with any other questions or concerns.

## 2016-03-27 NOTE — Telephone Encounter (Signed)
See Holter report. Would be reasonable to try low-dose Toprol XL 12.5 mg daily x 1 week and if well-tolerated increase to 25 mg daily.  Echo shows normal LV function.

## 2016-03-31 DIAGNOSIS — Z8551 Personal history of malignant neoplasm of bladder: Secondary | ICD-10-CM | POA: Diagnosis not present

## 2016-05-15 ENCOUNTER — Telehealth: Payer: Self-pay | Admitting: Family Medicine

## 2016-05-15 DIAGNOSIS — E119 Type 2 diabetes mellitus without complications: Secondary | ICD-10-CM

## 2016-05-15 DIAGNOSIS — E559 Vitamin D deficiency, unspecified: Secondary | ICD-10-CM

## 2016-05-16 ENCOUNTER — Encounter: Payer: Self-pay | Admitting: Family Medicine

## 2016-05-16 ENCOUNTER — Ambulatory Visit (INDEPENDENT_AMBULATORY_CARE_PROVIDER_SITE_OTHER): Payer: Medicare Other | Admitting: Family Medicine

## 2016-05-16 VITALS — BP 150/74 | HR 89 | Temp 97.6°F | Ht 61.5 in | Wt 123.8 lb

## 2016-05-16 DIAGNOSIS — B372 Candidiasis of skin and nail: Secondary | ICD-10-CM

## 2016-05-16 MED ORDER — NYSTATIN 100000 UNIT/GM EX POWD: Freq: Three times a day (TID) | CUTANEOUS | 0 refills | Status: DC

## 2016-05-16 NOTE — Telephone Encounter (Signed)
Cancelled all labs, pt stated insurance will not pay for labs but every 6 mos

## 2016-05-16 NOTE — Progress Notes (Signed)
BP (!) 159/70 (BP Location: Right Arm, Patient Position: Sitting, Cuff Size: Normal)   Pulse 89   Temp 97.6 F (36.4 C) (Oral)   Ht 5' 1.5" (1.562 m)   Wt 123 lb 12.8 oz (56.2 kg)   BMI 23.01 kg/m    Subjective:    Patient ID: Hannah French, female    DOB: 1940-12-11, 75 y.o.   MRN: [id number]  HPI: Hannah French is a 75 y.o. female presenting on 05/16/2016 for Rash (painful rash on right side and underneath right breast, also has "bites" on arms.  reports she has recently started Metoprolol also) and Headache   HPI Rash Patient has a rash that is tender under her right breast on her abdomen. The rash on her abdomen has been there for a couple days. She denies any fevers or chills or redness or drainage. It does feel a little warm and sometimes feels like it has a burning pain in that area.  Relevant past medical, surgical, family and social history reviewed and updated as indicated. Interim medical history since our last visit reviewed. Allergies and medications reviewed and updated.  Review of Systems  Constitutional: Negative for chills and fever.  HENT: Negative for congestion, ear discharge and ear pain.   Eyes: Negative for redness and visual disturbance.  Respiratory: Negative for chest tightness and shortness of breath.   Cardiovascular: Negative for chest pain and leg swelling.  Genitourinary: Negative for difficulty urinating and dysuria.  Musculoskeletal: Negative for back pain and gait problem.  Skin: Positive for color change and rash.  Neurological: Negative for light-headedness and headaches.  Psychiatric/Behavioral: Negative for agitation and behavioral problems.  All other systems reviewed and are negative.   Per HPI unless specifically indicated above     Medication List       Accurate as of 05/16/16  1:19 PM. Always use your most recent med list.          aspirin EC 81 MG tablet Take 1 tablet (81 mg total) by mouth daily.   carbamide  peroxide 6.5 % otic solution Commonly known as:  DEBROX Use once daily in each ear. About 5 drops in one year while laying with that side. After about 10 minutes turned over to the other side and put 5 drops in that ear and lay with that ear for about 10 minutes. Daily for 10 days then weekly   Fluticasone-Salmeterol 250-50 MCG/DOSE Aepb Commonly known as:  ADVAIR DISKUS Inhale 1 puff into the lungs 2 (two) times daily.   furosemide 20 MG tablet Commonly known as:  LASIX Take 20 mg by mouth as needed.   glimepiride 4 MG tablet Commonly known as:  AMARYL TAKE (1) TABLET DAILY AS DIRECTED.   glucose blood test strip Check blood glucose twice daily and prn   ibuprofen 200 MG tablet Commonly known as:  ADVIL,MOTRIN Take 200 mg by mouth as needed for headache or moderate pain.   metoprolol succinate 25 MG 24 hr tablet Commonly known as:  TOPROL-XL Take 1 tablet (25 mg total) by mouth daily.   nystatin powder Commonly known as:  nystatin Apply topically 3 (three) times daily. Give enough to last for at least 10 days   omeprazole 20 MG capsule Commonly known as:  PRILOSEC Take 1 capsule (20 mg total) by mouth daily.   ondansetron 4 MG tablet Commonly known as:  ZOFRAN Take 1 tablet (4 mg total) by mouth every 6 (six) hours.   VITAMIN  B COMPLEX PO Take by mouth daily.   Vitamin D (Ergocalciferol) 50000 units Caps capsule Commonly known as:  DRISDOL Take 1 capsule (50,000 Units total) by mouth every 7 (seven) days.          Objective:    BP (!) 159/70 (BP Location: Right Arm, Patient Position: Sitting, Cuff Size: Normal)   Pulse 89   Temp 97.6 F (36.4 C) (Oral)   Ht 5' 1.5" (1.562 m)   Wt 123 lb 12.8 oz (56.2 kg)   BMI 23.01 kg/m   Wt Readings from Last 3 Encounters:  05/16/16 123 lb 12.8 oz (56.2 kg)  03/07/16 122 lb 3.2 oz (55.4 kg)  03/02/16 122 lb (55.3 kg)    Physical Exam  Constitutional: She is oriented to person, place, and time. She appears  well-developed and well-nourished. No distress.  Eyes: Conjunctivae and EOM are normal. Pupils are equal, round, and reactive to light.  Cardiovascular: Normal rate, regular rhythm, normal heart sounds and intact distal pulses.   No murmur heard. Pulmonary/Chest: Effort normal and breath sounds normal. No respiratory distress. She has no wheezes.  Musculoskeletal: Normal range of motion. She exhibits no edema or tenderness.  Neurological: She is alert and oriented to person, place, and time. Coordination normal.  Skin: Skin is warm and dry. Rash noted. Rash is maculopapular (Patient has a pink elevated slightly warm rash under her right breast with small satellite lesions.). She is not diaphoretic.  Psychiatric: She has a normal mood and affect. Her behavior is normal.  Nursing note and vitals reviewed.     Assessment & Plan:   Problem List Items Addressed This Visit    None    Visit Diagnoses    Yeast dermatitis    -  Primary   Under her right breast   Relevant Medications   nystatin (NYSTATIN) powder      Follow up plan: Return if symptoms worsen or fail to improve.  Counseling provided for all of the vaccine components No orders of the defined types were placed in this encounter.   Caryl Pina, MD [facility] 05/16/2016, 1:19 PM

## 2016-05-19 ENCOUNTER — Telehealth: Payer: Self-pay | Admitting: Family Medicine

## 2016-05-19 DIAGNOSIS — B372 Candidiasis of skin and nail: Secondary | ICD-10-CM

## 2016-05-19 MED ORDER — FLUCONAZOLE 150 MG PO TABS: 150.0000 mg | ORAL_TABLET | Freq: Once | ORAL | 0 refills | Status: AC

## 2016-05-19 NOTE — Telephone Encounter (Signed)
Patient aware.

## 2016-05-26 ENCOUNTER — Encounter: Payer: Self-pay | Admitting: Cardiovascular Disease

## 2016-06-02 ENCOUNTER — Ambulatory Visit: Payer: Medicare Other | Admitting: Family Medicine

## 2016-06-12 ENCOUNTER — Ambulatory Visit: Payer: Medicare Other | Admitting: Cardiovascular Disease

## 2016-06-13 DIAGNOSIS — L03032 Cellulitis of left toe: Secondary | ICD-10-CM | POA: Diagnosis not present

## 2016-06-13 DIAGNOSIS — L03031 Cellulitis of right toe: Secondary | ICD-10-CM | POA: Diagnosis not present

## 2016-06-27 DIAGNOSIS — L03032 Cellulitis of left toe: Secondary | ICD-10-CM | POA: Diagnosis not present

## 2016-06-27 DIAGNOSIS — L03031 Cellulitis of right toe: Secondary | ICD-10-CM | POA: Diagnosis not present

## 2016-08-12 ENCOUNTER — Ambulatory Visit (INDEPENDENT_AMBULATORY_CARE_PROVIDER_SITE_OTHER): Payer: Medicare Other | Admitting: Family

## 2016-08-12 ENCOUNTER — Other Ambulatory Visit: Payer: Self-pay | Admitting: *Deleted

## 2016-08-12 ENCOUNTER — Encounter: Payer: Self-pay | Admitting: Family

## 2016-08-12 VITALS — BP 132/58 | HR 84 | Temp 96.7°F | Ht 61.5 in | Wt 123.0 lb

## 2016-08-12 DIAGNOSIS — J209 Acute bronchitis, unspecified: Secondary | ICD-10-CM

## 2016-08-12 MED ORDER — DOXYCYCLINE HYCLATE 100 MG PO TABS: 100.0000 mg | ORAL_TABLET | Freq: Two times a day (BID) | ORAL | 0 refills | Status: DC

## 2016-08-12 NOTE — Progress Notes (Signed)
   Subjective:    Patient ID: Hannah French, female    DOB: [birth year], 75 y.o.   MRN: [id number]  Cough  This is a new problem. The current episode started 1 to 4 weeks ago. The problem has been waxing and waning. The problem occurs every few minutes. The cough is productive of sputum and productive of purulent sputum. Associated symptoms include chills, ear congestion, nasal congestion, postnasal drip, a sore throat, shortness of breath and wheezing. Pertinent negatives include no ear pain, fever or myalgias. The symptoms are aggravated by lying down. Risk factors for lung disease include smoking/tobacco exposure. She has tried OTC cough suppressant and rest for the symptoms. The treatment provided no relief.      Review of Systems  Constitutional: Positive for chills. Negative for fever.  HENT: Positive for postnasal drip and sore throat. Negative for ear pain.   Respiratory: Positive for cough, shortness of breath and wheezing.   Musculoskeletal: Negative for myalgias.  All other systems reviewed and are negative.      Objective:   Physical Exam  Constitutional: She is oriented to person, place, and time. She appears well-developed and well-nourished. No distress.  HENT:  Head: Normocephalic and atraumatic.  Right Ear: External ear normal.  Left Ear: External ear normal.  Nose: Rhinorrhea present.  Mouth/Throat: Posterior oropharyngeal erythema present.  Eyes: Pupils are equal, round, and reactive to light.  Neck: Normal range of motion. Neck supple. No thyromegaly present.  Cardiovascular: Normal rate, regular rhythm, normal heart sounds and intact distal pulses.   No murmur heard. Pulmonary/Chest: Effort normal. No respiratory distress. She has decreased breath sounds. She has no wheezes.  Coarse nonproductive cough   Abdominal: Soft. Bowel sounds are normal. She exhibits no distension. There is no tenderness.  Musculoskeletal: Normal range of motion. She exhibits no edema  or tenderness.  Neurological: She is alert and oriented to person, place, and time.  Skin: Skin is warm and dry.  Psychiatric: She has a normal mood and affect. Her behavior is normal. Judgment and thought content normal.  Vitals reviewed.     Temp (!) 96.7 F (35.9 C) (Oral)   Ht 5' 1.5" (1.562 m)   Wt 123 lb (55.8 kg)   BMI 22.86 kg/m      Assessment & Plan:  1. Acute bronchitis, unspecified organism -- Take meds as prescribed - Use a cool mist humidifier  -Use saline nose sprays frequently -Saline irrigations of the nose can be very helpful if done frequently.  * 4X daily for 1 week*  * Use of a nettie pot can be helpful with this. Follow directions with this* -Force fluids -For any cough or congestion  Use plain Mucinex- regular strength or max strength is fine   * Children- consult with Pharmacist for dosing -For fever or aces or pains- take tylenol or ibuprofen appropriate for age and weight.  * for fevers greater than 101 orally you may alternate ibuprofen and tylenol every  3 hours. -Throat lozenges if help - doxycycline (VIBRA-TABS) 100 MG tablet; Take 1 tablet (100 mg total) by mouth 2 (two) times daily.  Dispense: 20 tablet; Refill: 0  Evelina Dun, FNP

## 2016-08-12 NOTE — Patient Instructions (Signed)

## 2016-08-23 ENCOUNTER — Telehealth: Payer: Self-pay | Admitting: Family Medicine

## 2016-08-23 DIAGNOSIS — E119 Type 2 diabetes mellitus without complications: Secondary | ICD-10-CM

## 2016-08-23 DIAGNOSIS — E785 Hyperlipidemia, unspecified: Secondary | ICD-10-CM

## 2016-08-24 NOTE — Telephone Encounter (Signed)
Aware. 

## 2016-08-25 ENCOUNTER — Other Ambulatory Visit: Payer: Medicare Other

## 2016-08-25 DIAGNOSIS — E785 Hyperlipidemia, unspecified: Secondary | ICD-10-CM | POA: Diagnosis not present

## 2016-08-25 DIAGNOSIS — E119 Type 2 diabetes mellitus without complications: Secondary | ICD-10-CM | POA: Diagnosis not present

## 2016-08-25 LAB — BAYER DCA HB A1C WAIVED: HB A1C (BAYER DCA - WAIVED): 8.2 % — ABNORMAL HIGH (ref <7.0)

## 2016-08-26 LAB — CMP14+EGFR
ALT: 10 IU/L (ref 0–32)
AST: 17 IU/L (ref 0–40)
Albumin/Globulin Ratio: 1.4 (ref 1.2–2.2)
Albumin: 3.9 g/dL (ref 3.5–4.8)
Alkaline Phosphatase: 64 IU/L (ref 39–117)
BUN/Creatinine Ratio: 21 (ref 12–28)
BUN: 16 mg/dL (ref 8–27)
Bilirubin Total: 0.8 mg/dL (ref 0.0–1.2)
CO2: 27 mmol/L (ref 18–29)
Calcium: 9.2 mg/dL (ref 8.7–10.3)
Chloride: 100 mmol/L (ref 96–106)
Creatinine, Ser: 0.75 mg/dL (ref 0.57–1.00)
GFR calc Af Amer: 90 mL/min/{1.73_m2} (ref >59)
GFR calc non Af Amer: 78 mL/min/{1.73_m2} (ref >59)
Globulin, Total: 2.8 g/dL (ref 1.5–4.5)
Glucose: 115 mg/dL — ABNORMAL HIGH (ref 65–99)
Potassium: 4.4 mmol/L (ref 3.5–5.2)
Sodium: 142 mmol/L (ref 134–144)
Total Protein: 6.7 g/dL (ref 6.0–8.5)

## 2016-08-26 LAB — LIPID PANEL
Chol/HDL Ratio: 2.9 ratio units (ref 0.0–4.4)
Cholesterol, Total: 155 mg/dL (ref 100–199)
HDL: 53 mg/dL (ref >39)
LDL Calculated: 89 mg/dL (ref 0–99)
Triglycerides: 64 mg/dL (ref 0–149)
VLDL Cholesterol Cal: 13 mg/dL (ref 5–40)

## 2016-08-26 LAB — MICROALBUMIN / CREATININE URINE RATIO
Creatinine, Urine: 19.3 mg/dL
Microalb/Creat Ratio: 69.9 mg/g creat — ABNORMAL HIGH (ref 0.0–30.0)
Microalbumin, Urine: 13.5 ug/mL

## 2016-08-29 ENCOUNTER — Ambulatory Visit: Payer: Medicare Other

## 2016-08-30 ENCOUNTER — Encounter: Payer: Self-pay | Admitting: Family Medicine

## 2016-08-30 ENCOUNTER — Ambulatory Visit (INDEPENDENT_AMBULATORY_CARE_PROVIDER_SITE_OTHER): Payer: Medicare Other | Admitting: Family Medicine

## 2016-08-30 VITALS — BP 111/63 | HR 90 | Temp 97.2°F | Ht 61.5 in | Wt 124.5 lb

## 2016-08-30 DIAGNOSIS — E785 Hyperlipidemia, unspecified: Secondary | ICD-10-CM

## 2016-08-30 DIAGNOSIS — E119 Type 2 diabetes mellitus without complications: Secondary | ICD-10-CM | POA: Diagnosis not present

## 2016-08-30 DIAGNOSIS — J441 Chronic obstructive pulmonary disease with (acute) exacerbation: Secondary | ICD-10-CM | POA: Diagnosis not present

## 2016-08-30 MED ORDER — PREDNISONE 20 MG PO TABS: ORAL_TABLET | ORAL | 0 refills | Status: DC

## 2016-08-30 MED ORDER — FUROSEMIDE 20 MG PO TABS: 20.0000 mg | ORAL_TABLET | ORAL | 2 refills | Status: DC | PRN

## 2016-08-30 MED ORDER — ALBUTEROL SULFATE HFA 108 (90 BASE) MCG/ACT IN AERS: 2.0000 | INHALATION_SPRAY | Freq: Four times a day (QID) | RESPIRATORY_TRACT | 0 refills | Status: DC | PRN

## 2016-08-30 MED ORDER — FLUTICASONE-SALMETEROL 100-50 MCG/DOSE IN AEPB: 1.0000 | INHALATION_SPRAY | Freq: Two times a day (BID) | RESPIRATORY_TRACT | 3 refills | Status: DC

## 2016-08-30 MED ORDER — EMPAGLIFLOZIN 25 MG PO TABS: 25.0000 mg | ORAL_TABLET | Freq: Every day | ORAL | 3 refills | Status: DC

## 2016-08-30 MED ORDER — VITAMIN D (ERGOCALCIFEROL) 1.25 MG (50000 UNIT) PO CAPS: 50000.0000 [IU] | ORAL_CAPSULE | ORAL | 3 refills | Status: DC

## 2016-08-30 NOTE — Progress Notes (Signed)
BP 111/63   Pulse 90   Temp 97.2 F (36.2 C) (Oral)   Ht 5' 1.5" (1.562 m)   Wt 124 lb 8 oz (56.5 kg)   BMI 23.14 kg/m    Subjective:    Patient ID: Hannah French, female    DOB: 09-22-1941, 75 y.o.   MRN: [id number]  HPI: Hannah French is a 75 y.o. female presenting on 08/30/2016 for Diabetes (followup); Hyperlipidemia; and Chest congestion and cough (was seen on 11/18 and prescribed antibiotic, symptoms have not improved)   HPI Type 2 diabetes recheck Patient is coming in for type 2 diabetes recheck. She is currently on Amaryl. She has been intolerant of metformin previously and does not like it is a medication. She had her labs drawn a few days ago and her hemoglobin A1c came back as 8.2 which was higher than what it was previously. We discussed the need for a new medication and we'll see if we can find one that is affordable for her. She denies any new issues with her feet. She has not seen an ophthalmologist yet this year but plans to Near the beginning of this next year. She is not currently on an ACE inhibitor but we will continue to monitor her urine. Her last microalbumin was 13.5 done a few days ago.  Hyperlipidemia Patient is coming in for a hyperlipidemia recheck. She is not currently on any medications because she has been intolerant of pravastatin before. Patient denies headaches, blurred vision, chest pains, shortness of breath, or weakness. Denies any side effects from medication and is content with current medication.   COPD Patient is coming in for COPD recheck. She is currently using albuterol but has not had to use it at least 3 or 4 weeks. She denies any shortness of breath or wheezing. She does have a cough but that is chronic and been stable for her.  Relevant past medical, surgical, family and social history reviewed and updated as indicated. Interim medical history since our last visit reviewed. Allergies and medications reviewed and updated.  Review of  Systems  Constitutional: Negative for chills and fever.  HENT: Negative for congestion, ear discharge, ear pain, rhinorrhea, sinus pain, sinus pressure and sneezing.   Eyes: Negative for redness and visual disturbance.  Respiratory: Positive for cough. Negative for chest tightness, shortness of breath and wheezing.   Cardiovascular: Negative for chest pain and leg swelling.  Genitourinary: Negative for difficulty urinating and dysuria.  Musculoskeletal: Negative for back pain and gait problem.  Skin: Negative for rash.  Neurological: Negative for dizziness, light-headedness and headaches.  Psychiatric/Behavioral: Negative for agitation and behavioral problems.  All other systems reviewed and are negative.   Per HPI unless specifically indicated above      Objective:    BP 111/63   Pulse 90   Temp 97.2 F (36.2 C) (Oral)   Ht 5' 1.5" (1.562 m)   Wt 124 lb 8 oz (56.5 kg)   BMI 23.14 kg/m   Wt Readings from Last 3 Encounters:  08/30/16 124 lb 8 oz (56.5 kg)  08/12/16 123 lb (55.8 kg)  05/16/16 123 lb 12.8 oz (56.2 kg)    Physical Exam  Constitutional: She is oriented to person, place, and time. She appears well-developed and well-nourished. No distress.  Eyes: Conjunctivae are normal.  Cardiovascular: Normal rate, regular rhythm, normal heart sounds and intact distal pulses.   No murmur heard. Pulmonary/Chest: Effort normal and breath sounds normal. No respiratory distress. She  has no wheezes. She has no rales.  Musculoskeletal: Normal range of motion. She exhibits no edema or tenderness.  Neurological: She is alert and oriented to person, place, and time. Coordination normal.  Skin: Skin is warm and dry. No rash noted. She is not diaphoretic.  Psychiatric: She has a normal mood and affect. Her behavior is normal.  Nursing note and vitals reviewed.   Results for orders placed or performed in visit on 08/25/16  CMP14+EGFR  Result Value Ref Range   Glucose 115 (H) 65 - 99  mg/dL   BUN 16 8 - 27 mg/dL   Creatinine, Ser 0.75 0.57 - 1.00 mg/dL   GFR calc non Af Amer 78 >59 mL/min/1.73   GFR calc Af Amer 90 >59 mL/min/1.73   BUN/Creatinine Ratio 21 12 - 28   Sodium 142 134 - 144 mmol/L   Potassium 4.4 3.5 - 5.2 mmol/L   Chloride 100 96 - 106 mmol/L   CO2 27 18 - 29 mmol/L   Calcium 9.2 8.7 - 10.3 mg/dL   Total Protein 6.7 6.0 - 8.5 g/dL   Albumin 3.9 3.5 - 4.8 g/dL   Globulin, Total 2.8 1.5 - 4.5 g/dL   Albumin/Globulin Ratio 1.4 1.2 - 2.2   Bilirubin Total 0.8 0.0 - 1.2 mg/dL   Alkaline Phosphatase 64 39 - 117 IU/L   AST 17 0 - 40 IU/L   ALT 10 0 - 32 IU/L  Lipid panel  Result Value Ref Range   Cholesterol, Total 155 100 - 199 mg/dL   Triglycerides 64 0 - 149 mg/dL   HDL 53 >39 mg/dL   VLDL Cholesterol Cal 13 5 - 40 mg/dL   LDL Calculated 89 0 - 99 mg/dL   Chol/HDL Ratio 2.9 0.0 - 4.4 ratio units  Bayer DCA Hb A1c Waived  Result Value Ref Range   Bayer DCA Hb A1c Waived 8.2 (H) <7.0 %  Microalbumin / creatinine urine ratio  Result Value Ref Range   Creatinine, Urine 19.3 Not Estab. mg/dL   Microalbum.,U,Random 13.5 Not Estab. ug/mL   Microalb/Creat Ratio 69.9 (H) 0.0 - 30.0 mg/g creat      Assessment & Plan:   Problem List Items Addressed This Visit      Endocrine   Type 2 diabetes mellitus without complication, without long-term current use of insulin (HCC) - Primary   Relevant Medications   empagliflozin (JARDIANCE) 25 MG TABS tablet     Other   Hyperlipidemia with target LDL less than 70   Relevant Medications   furosemide (LASIX) 20 MG tablet    Other Visit Diagnoses    COPD exacerbation (HCC)       Relevant Medications   Fluticasone-Salmeterol (ADVAIR) 100-50 MCG/DOSE AEPB   predniSONE (DELTASONE) 20 MG tablet   albuterol (PROVENTIL HFA;VENTOLIN HFA) 108 (90 Base) MCG/ACT inhaler     Started Jardiance new, we'll see if she can afford it and see how it does for her.   Follow up plan: Return in about 3 months (around  11/28/2016), or if symptoms worsen or fail to improve, for Recheck diabetes, also do a referral to Tammy if she has not seen her yet.  Counseling provided for all of the vaccine components No orders of the defined types were placed in this encounter.   Caryl Pina, MD [facility] 08/30/2016, 10:10 AM

## 2016-09-15 ENCOUNTER — Other Ambulatory Visit: Payer: Self-pay | Admitting: *Deleted

## 2016-09-15 ENCOUNTER — Telehealth: Payer: Self-pay | Admitting: Cardiovascular Disease

## 2016-09-15 MED ORDER — METOPROLOL SUCCINATE ER 25 MG PO TB24: 25.0000 mg | ORAL_TABLET | Freq: Every day | ORAL | 1 refills | Status: DC

## 2016-09-15 NOTE — Telephone Encounter (Signed)
New Message   *STAT* If patient is at the pharmacy, call can be transferred to refill team.   1. Which medications need to be refilled? (please list name of each medication and dose if known)  metoprolol succinate toprol-ex 25 mg once daily  2. Which pharmacy/location (including street and city if local pharmacy) is medication to be sent to? [facility], [place] [address] [place], [place]  3. Do they need a 30 day or 90 day supply? 90 days  Pt voiced wanting to know if we can delete the prescriptions that's in for 30 days and submit this one up for 90 days because she'll be out of town come Jan.

## 2016-10-07 ENCOUNTER — Ambulatory Visit (INDEPENDENT_AMBULATORY_CARE_PROVIDER_SITE_OTHER): Payer: Medicare Other | Admitting: Nurse Practitioner

## 2016-10-07 ENCOUNTER — Encounter: Payer: Self-pay | Admitting: Nurse Practitioner

## 2016-10-07 VITALS — BP 155/69 | HR 90 | Temp 97.4°F | Ht 61.5 in | Wt 127.0 lb

## 2016-10-07 DIAGNOSIS — J441 Chronic obstructive pulmonary disease with (acute) exacerbation: Secondary | ICD-10-CM

## 2016-10-07 MED ORDER — LEVOFLOXACIN 500 MG PO TABS: 500.0000 mg | ORAL_TABLET | Freq: Every day | ORAL | 0 refills | Status: DC

## 2016-10-07 MED ORDER — GLUCOSE BLOOD VI STRP: ORAL_STRIP | 11 refills | Status: DC

## 2016-10-07 MED ORDER — ONETOUCH ULTRASOFT LANCETS MISC: 11 refills | Status: DC

## 2016-10-07 MED ORDER — PREDNISONE 20 MG PO TABS: ORAL_TABLET | ORAL | 0 refills | Status: DC

## 2016-10-07 NOTE — Patient Instructions (Signed)

## 2016-10-07 NOTE — Progress Notes (Addendum)
   Subjective:    Patient ID: Hannah French, female    DOB: [birth year], 76 y.o.   MRN: [id number]  HPI Patient in today for follow up of bronchitis- she was given steroids on 08/30/16 for COPD exacerbation. Got some better but cough has not completely resolved and the last several days has gotten worse.    Review of Systems  Constitutional: Negative for appetite change, chills and fever.  HENT: Positive for congestion.   Respiratory: Positive for cough and shortness of breath (only when coughing).   Cardiovascular: Negative.   Gastrointestinal: Negative.   Genitourinary: Negative.   Neurological: Negative.   Psychiatric/Behavioral: Negative.   All other systems reviewed and are negative.      Objective:   Physical Exam  Constitutional: She is oriented to person, place, and time. She appears well-developed and well-nourished. No distress.  HENT:  Right Ear: Hearing, tympanic membrane, external ear and ear canal normal.  Left Ear: Hearing, tympanic membrane, external ear and ear canal normal.  Nose: Mucosal edema and rhinorrhea present. Right sinus exhibits no maxillary sinus tenderness and no frontal sinus tenderness. Left sinus exhibits no maxillary sinus tenderness and no frontal sinus tenderness.  Mouth/Throat: Uvula is midline, oropharynx is clear and moist and mucous membranes are normal.  Eyes: Pupils are equal, round, and reactive to light.  Neck: Normal range of motion. Neck supple.  Cardiovascular: Normal rate and regular rhythm.   Pulmonary/Chest: Effort normal.  ronchi scattered throughout lung fields  Abdominal: Soft. Bowel sounds are normal.  Neurological: She is alert and oriented to person, place, and time.  Psychiatric: She has a normal mood and affect. Her behavior is normal. Judgment and thought content normal.   BP (!) 155/69 (BP Location: Left Arm)   Pulse 90   Temp 97.4 F (36.3 C) (Oral)   Ht 5' 1.5" (1.562 m)   Wt 127 lb (57.6 kg)   BMI 23.61 kg/m         Assessment & Plan:   1. COPD exacerbation ([place])    Meds ordered this encounter  Medications  . glucose blood test strip    Sig: Check blood glucose twice daily and prn    Dispense:  100 each    Refill:  11    One touch Verio DX:E11.9  . Lancets (ONETOUCH ULTRASOFT) lancets    Sig: Test BS BID and PRN.E11.9    Dispense:  100 each    Refill:  11  . levofloxacin (LEVAQUIN) 500 MG tablet    Sig: Take 1 tablet (500 mg total) by mouth daily.    Dispense:  7 tablet    Refill:  0    Order Specific Question:   Supervising Provider    Answer:   VINCENT, CAROL L [[id number]]  . predniSONE (DELTASONE) 20 MG tablet    Sig: 2 po at sametime daily for 5 days    Dispense:  10 tablet    Refill:  0    Order Specific Question:   Supervising Provider    Answer:   Eustaquio Maize [[id number]]  keep close check of blood sugars while on prednisone. Continue inhalers as rx Force fluids Run humidifier Smoking cessation encouraged Follow up prn  Mary-Margaret Hassell Done, FNP

## 2016-10-10 DIAGNOSIS — M170 Bilateral primary osteoarthritis of knee: Secondary | ICD-10-CM | POA: Diagnosis not present

## 2016-11-28 ENCOUNTER — Telehealth: Payer: Self-pay | Admitting: Cardiovascular Disease

## 2016-11-28 DIAGNOSIS — I739 Peripheral vascular disease, unspecified: Secondary | ICD-10-CM

## 2016-11-28 DIAGNOSIS — I779 Disorder of arteries and arterioles, unspecified: Secondary | ICD-10-CM

## 2016-11-28 NOTE — Telephone Encounter (Signed)
I will forward message to Dr Burt Knack to review chart and determine what testing the pt is due for in June.  Last year the pt had an Echocardiogram, Carotid, ABI and aorto-iliac duplex performed. The vascular studies are due to be repeated at 1 year but need clarification if Echo needs to be repeated.   The pt is also overdue for follow-up office visit with Dr Burt Knack.

## 2016-11-28 NOTE — Telephone Encounter (Signed)
New message     Pt is calling to ask nurse to put in orders for her to have an echo, carotid ultrasound and another test she gets every year in June.  If she is not due these test, please call.  Otherwise, pt will call back in a few days to schedule these test in June.

## 2016-12-02 NOTE — Telephone Encounter (Signed)
Please do vascular studies as recommended. Echo does not need to be repeated. thanks

## 2016-12-05 NOTE — Telephone Encounter (Signed)
Orders placed for aorto-iliac, ABI and carotid to be performed in June. The pt is also due for appointment with Dr Burt Knack.  I will forward this message to a scheduler to contact the pt and arrange appointments.

## 2016-12-05 NOTE — Telephone Encounter (Signed)
Vascular studies and office visit have been scheduled.

## 2016-12-26 ENCOUNTER — Ambulatory Visit (INDEPENDENT_AMBULATORY_CARE_PROVIDER_SITE_OTHER): Payer: Medicare Other | Admitting: Family Medicine

## 2016-12-26 ENCOUNTER — Encounter: Payer: Self-pay | Admitting: Family Medicine

## 2016-12-26 VITALS — BP 130/76 | HR 86 | Temp 97.0°F | Ht 61.5 in | Wt 129.6 lb

## 2016-12-26 DIAGNOSIS — Q828 Other specified congenital malformations of skin: Secondary | ICD-10-CM | POA: Diagnosis not present

## 2016-12-26 DIAGNOSIS — M792 Neuralgia and neuritis, unspecified: Secondary | ICD-10-CM | POA: Diagnosis not present

## 2016-12-26 MED ORDER — GABAPENTIN 300 MG PO CAPS: 300.0000 mg | ORAL_CAPSULE | Freq: Three times a day (TID) | ORAL | 0 refills | Status: DC

## 2016-12-26 NOTE — Progress Notes (Signed)
   HPI  Patient presents today here with concern for shingles.  Patient explains that she's had burning and tingling type pain and a strip of her anterior left lower extremity from the mid shin up towards the knee for about 10 days.  She has a history of porokeratosis and states that she has developed 2 lesions since this started.  She is treated porokeratosis in the past with clobetasol ointment which she is using. She states that the pain feels very characteristic of shingles type pain that she's had before.  Patient denies any persistent rash in that area but states that she did have a red area on the anterior left lateral ankle last night which has resolved by this morning. She denies fever, chills, sweats, or diffuse leg swelling. She has no calf tenderness. 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  PMH: Smoking status noted ROS: Per HPI  Objective: BP 130/76   Pulse 86   Temp 97 F (36.1 C) (Oral)   Ht 5' 1.5" (1.562 m)   Wt 129 lb 9.6 oz (58.8 kg)   BMI 24.09 kg/m  Gen: NAD, alert, cooperative with exam HEENT: NCAT CV: RRR, good S1/S2, no murmur Resp: CTABL, no wheezes, non-labored Ext: No edema, warm Neuro: Alert and oriented, No gross deficits Distribution of pain is in the L5 dermatome from mid shin to knee  Skin:  Anterior shin with erythematous circular lesion with heme crusting approximate 1 cm in diameter with an erythematous ring approximate 2 cm in diameter, similar lesion of the posterior left calf. She has another lesion that's raised and slightly tender just lateral and distal to the anterior lesion.  Assessment and plan:  # Oral keratosis, neuropathic pain Patient's pain is characteristic of shingles type pain, however there is no sign of shingles. Treating neuropathic pain with gabapentin, it's unclear whether this is due to porokeratosis, low back problem, or possibly even shingles that has not shown a rash  yet. Unclear reason to treat with Valtrex today. Avoiding prednisone with diabetes. Patient states that she usually does not have sedated effect from medications, discussed at in detail starting gabapentin 1 pill on day 1, 1 pill twice daily on day 2, one pill 3 times daily on day 3, advancing only if she can tolerate without sedation. She has clobetasol for porokeratosis Recommended follow-up with dermatology as soon as possible.    Meds ordered this encounter  Medications  . gabapentin (NEURONTIN) 300 MG capsule    Sig: Take 1 capsule (300 mg total) by mouth 3 (three) times daily.    Dispense:  90 capsule    Refill:  0    Laroy Apple, MD [facility] 12/26/2016, 2:22 PM

## 2016-12-26 NOTE — Patient Instructions (Signed)
Great to see you!  I believe this pain is not due to shingles, if you develop a more discrete rash then please let me know.   I am concerned you are devloping pain from porokeratosis.   Gabapentin can help with nerve pain, start with 1 pill at night, then try 1 pill morning and another at night. On the third day 1 pill 3 times a day.

## 2017-01-03 DIAGNOSIS — L821 Other seborrheic keratosis: Secondary | ICD-10-CM | POA: Diagnosis not present

## 2017-01-03 DIAGNOSIS — D1801 Hemangioma of skin and subcutaneous tissue: Secondary | ICD-10-CM | POA: Diagnosis not present

## 2017-01-03 DIAGNOSIS — L814 Other melanin hyperpigmentation: Secondary | ICD-10-CM | POA: Diagnosis not present

## 2017-01-03 DIAGNOSIS — L98499 Non-pressure chronic ulcer of skin of other sites with unspecified severity: Secondary | ICD-10-CM | POA: Diagnosis not present

## 2017-01-04 ENCOUNTER — Ambulatory Visit (INDEPENDENT_AMBULATORY_CARE_PROVIDER_SITE_OTHER): Payer: Medicare Other | Admitting: Cardiovascular Disease

## 2017-01-04 ENCOUNTER — Encounter: Payer: Self-pay | Admitting: Cardiovascular Disease

## 2017-01-04 ENCOUNTER — Telehealth: Payer: Self-pay

## 2017-01-04 VITALS — BP 110/70 | HR 107 | Ht 61.5 in | Wt 126.0 lb

## 2017-01-04 DIAGNOSIS — I739 Peripheral vascular disease, unspecified: Secondary | ICD-10-CM | POA: Diagnosis not present

## 2017-01-04 NOTE — Progress Notes (Signed)
Cardiology Office Note Date:  01/07/2017   ID:  Hannah French, DOB 07/17/1941, MRN [id number]  PCP:  Worthy Rancher, MD  Cardiologist:  Sherren Mocha, MD    Chief Complaint  Patient presents with  . Rash     History of Present Illness: Hannah French is a 75 y.o. female who presents for evaluation of leg rash/leg pain.   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   She is a longstanding smoker. Also with diabetes. She was recently seen by her dermatologist with a 2 week history of leg pain and ulceration. Referred for vascular assessment over concern of arterial insufficiency.   The patient complains of bilateral leg pain, not clearly associated with walking. Pain is in lower legs, shins, calves. Also with leg swelling. No chest pain, shortness of breath, fever, orthopnea, PND. Denies any leg injury. Has had some pain around the lesions on her lower legs. No foot lesions.    Past Medical History:  Diagnosis Date  . Abdominal bruit   . Bladder cancer ([place])   . Carotid bruit   . Diabetes mellitus    Type II  . Dyslipidemia   . Heart murmur   . Mitral valve prolapse   . Osteoporosis   . Pancreatitis, acute   . Pseudogout   . PVD (peripheral vascular disease) ([place])     Past Surgical History:  Procedure Laterality Date  . APPENDECTOMY    . Hysterectomy-type unspecified    . LAPAROSCOPIC CHOLECYSTECTOMY  2009    Current Outpatient Prescriptions  Medication Sig Dispense Refill  . Vitamin D, Ergocalciferol, (DRISDOL) 50000 units CAPS capsule Take 1 capsule (50,000 Units total) by mouth every 7 (seven) days. 12 capsule 3  . B Complex Vitamins (VITAMIN B COMPLEX PO) Take by mouth daily.    . carbamide peroxide (DEBROX) 6.5 % otic solution Use once daily in each ear. About 5 drops in one year while laying with that side. After about 10 minutes turned over to the  other side and put 5 drops in that ear and lay with that ear for about 10 minutes. Daily for 10 days then weekly 15 mL 11  . empagliflozin (JARDIANCE) 25 MG TABS tablet Take 25 mg by mouth daily. 30 tablet 3  . Fluticasone-Salmeterol (ADVAIR) 100-50 MCG/DOSE AEPB Inhale 1 puff into the lungs 2 (two) times daily. 1 each 3  . furosemide (LASIX) 20 MG tablet Take 1 tablet (20 mg total) by mouth as needed. 30 tablet 2  . glimepiride (AMARYL) 4 MG tablet TAKE (1) TABLET DAILY AS DIRECTED. 90 tablet 0  . glucose blood test strip Check blood glucose twice daily and prn 100 each 11  . ibuprofen (ADVIL,MOTRIN) 200 MG tablet Take 200 mg by mouth as needed for headache or moderate pain.     . Lancets (ONETOUCH ULTRASOFT) lancets Test BS BID and PRN.E11.9 100 each 11  . metoprolol succinate (TOPROL-XL) 25 MG 24 hr tablet Take 1 tablet (25 mg total) by mouth daily. 90 tablet 1  . omeprazole (PRILOSEC) 20 MG capsule Take 1 capsule (20 mg total) by mouth daily. 30 capsule 3   No current facility-administered medications for this visit.     Allergies:   Bactrim [sulfamethoxazole-trimethoprim]; Contrast media [iodinated diagnostic agents]; Iohexol; Metformin; Pravastatin; and Rofecoxib   Social History:  The patient  reports that she has been smoking Cigarettes.  She has a 30.00 pack-year smoking history. She has never used smokeless tobacco. She reports that she does not drink alcohol or use drugs.   Family History:  [name]  family history includes Heart attack in her brother and maternal uncle; Heart failure in her mother; Hypertension in her brother and sister.        Past Medical History:  Diagnosis Date  . Abdominal bruit   . Bladder cancer ([place])   . Carotid bruit   . Diabetes mellitus    Type II  . Dyslipidemia   . Heart murmur   . Mitral valve prolapse   . Osteoporosis   . Pancreatitis, acute   . Pseudogout   . PVD (peripheral vascular disease) ([place])          Past  Surgical History:  Procedure Laterality Date  . APPENDECTOMY    . Hysterectomy-type unspecified    . LAPAROSCOPIC CHOLECYSTECTOMY  2009          Current Outpatient Prescriptions  Medication Sig Dispense Refill  . Vitamin D, Ergocalciferol, (DRISDOL) 50000 units CAPS capsule Take 1 capsule (50,000 Units total) by mouth every 7 (seven) days. 12 capsule 3  . B Complex Vitamins (VITAMIN B COMPLEX PO) Take by mouth daily.    . carbamide peroxide (DEBROX) 6.5 % otic solution Use once daily in each ear. About 5 drops in one year while laying with that side. After about 10 minutes turned over to the other side and put 5 drops in that ear and lay with that ear for about 10 minutes. Daily for 10 days then weekly 15 mL 11  . empagliflozin (JARDIANCE) 25 MG TABS tablet Take 25 mg by mouth daily. 30 tablet 3  . Fluticasone-Salmeterol (ADVAIR) 100-50 MCG/DOSE AEPB Inhale 1 puff into the lungs 2 (two) times daily. 1 each 3  . furosemide (LASIX) 20 MG tablet Take 1 tablet (20 mg total) by mouth as needed. 30 tablet 2  . glimepiride (AMARYL) 4 MG tablet TAKE (1) TABLET DAILY AS DIRECTED. 90 tablet 0  . glucose blood test strip Check blood glucose twice daily and prn 100 each 11  . ibuprofen (ADVIL,MOTRIN) 200 MG tablet Take 200 mg by mouth as needed for headache or moderate pain.     . Lancets (ONETOUCH ULTRASOFT) lancets Test BS BID and PRN.E11.9 100 each 11  . metoprolol succinate (TOPROL-XL) 25 MG 24 hr tablet Take 1 tablet (25 mg total) by mouth daily. 90 tablet 1  . omeprazole (PRILOSEC) 20 MG capsule Take 1 capsule (20 mg total) by mouth daily. 30 capsule 3   No current facility-administered medications for this visit.     Allergies:   Bactrim [sulfamethoxazole-trimethoprim]; Contrast media [iodinated diagnostic agents]; Iohexol; Metformin; Pravastatin; and Rofecoxib   Social History:  The patient  reports that she has been smoking Cigarettes.  She has a 30.00 pack-year smoking  history. She has never used smokeless tobacco. She reports that she does not drink alcohol or use drugs.   Family History:  [name]  family history includes Heart attack in her brother and maternal uncle; Heart failure in her mother; Hypertension in her brother and sister.    ROS:  Please see the history of present illness.  Otherwise, review of systems is positive for hearing loss, easy bruising.  All other systems are reviewed and negative.    PHYSICAL EXAM: VS:  BP 110/70   Pulse (!) 107   Ht 5' 1.5" (1.562 m)   Wt  126 lb (57.2 kg)   SpO2 96%   BMI 23.42 kg/m  , BMI Body mass index is 23.42 kg/m. GEN: Well nourished, well developed, in no acute distress  HEENT: normal  Neck: no JVD, no masses. bilateral carotid bruits Cardiac: RRR with 3/6 harsh systolic murmur LUSB/subclavian area            Respiratory:  clear to auscultation bilaterally, normal work of breathing GI: soft, nontender, nondistended, + BS MS: no deformity or atrophy  Ext: 1+ pretibial/pedal edema, dependent rubor both feet Skin: warm and dry, skin lesions well circumscribed with ulceration left anterior shin and right posterior calf, nontender Neuro:  Strength and sensation are intact Psych: euthymic mood, full affect  EKG:  EKG is ordered today. The ekg ordered today shows NSR 98 bpm, age-indeterminate septal infarct  Recent Labs: 02/29/2016: Hemoglobin 14.6; Platelets 241 08/25/2016: ALT 10; BUN 16; Creatinine, Ser 0.75; Potassium 4.4; Sodium 142   Lipid Panel  Labs (Brief)          Component Value Date/Time   CHOL 155 08/25/2016 0830   TRIG 64 08/25/2016 0830   TRIG 82 06/09/2014 1028   HDL 53 08/25/2016 0830   HDL 57 06/09/2014 1028   CHOLHDL 2.9 08/25/2016 0830   CHOLHDL 3.5 02/06/2013 0932   VLDL 20 02/06/2013 0932   LDLCALC 89 08/25/2016 0830   LDLCALC 131 (H) 06/09/2014 1028           Wt Readings from Last 3 Encounters:  01/04/17 126 lb (57.2 kg)  12/26/16 129  lb 9.6 oz (58.8 kg)  10/07/16 127 lb (57.6 kg)     Cardiac Studies Reviewed: Carotid US 03/03/16: Impressions Duplex imaging, with color Doppler, of the carotid arteries reveals heterogenous plaque in the CCA, ICA and ECA, bilaterally. The RICA velocities are elevated and stable as compared to prior exam. The LICA velocities are elevated and have increased from prior exam. The right subclavian artery is widely patent with normal velocities. The left subclavian artery velocities are elevated. The right vertebral artery is patent with antegrade flow. The left vertebral artery has a to and fro pattern. Technologist Notes Plaque Plaque Examination Data cm/s cm/s Stable heterogeneous plaque, bilaterally. 1-39% RICA stenosis. 51-88% LICA stenosis, velocities have increased. Left subclavian stenosis with steal. Right vertebral artery is patent and antegrade. Left vertebral artery with to and fro flow. f/u 1 year.  Echo 03-07-2016: Study Conclusions  - Left ventricle: The cavity size was normal. Wall thickness was normal. Systolic function was normal. The estimated ejection fraction was in the range of 60% to 65%. Wall motion was normal; there were no regional wall motion abnormalities. Doppler parameters are consistent with abnormal left ventricular relaxation (grade 1 diastolic dysfunction). The E/e&' ratio is between 8-15, suggesting indeterminate LV filling pressure. - Mitral valve: Mildly thickened leaflets . There was trivial regurgitation. - Left atrium: The atrium was normal in size. - Tricuspid valve: There was trivial regurgitation. - Pulmonary arteries: PA peak pressure: 21 mm Hg (S). - Inferior vena cava: The vessel was normal in size. The respirophasic diameter changes were in the normal range (= 50%), consistent with normal central venous pressure.  Impressions:  - Compared to a prior echo in 2010, the LVEF is higher at 60-65%.  ABI Study  03/03/16: Impressions Known severe bilateral common iliac artery stenosis. Bilateral ABI's remain within normal range. Normal great toe-brachial indices, bilaterally. Abnormal great toe-brachial indices, bilaterally, although not at risk or tissue loss. See Aorto-iliac Duplex  study.  ASSESSMENT AND PLAN: 1.  PAD with ulceration: not clear to me whether lower extremity lesions are arterial in etiology. Pt with known severe iliac disease with peak systolic velocities > 4 m/s bilaterally. Previously managed medically with no associated symptoms. Now with skin lesions, dependent rubor, edema, and atypical leg pain. Recommend updated ABI, aorto-iliac duplex, lower extremity arterial duplex, and PV consultation. Studies will be arranged early next week.   2. Left subclavian stenosis: asymptomatic. Loud bruit on exam. Clinical FU/medical therapy appropriate.  3. Tobacco abuse: cessation counseling done. Longstanding smoker.   4. Type II DM: treated with oral hypoglycemics. Followed by PCP.   Pt at high risk with heavy tobacco use and diabetes. Referral for vascular ultrasound studies and PV consultation for consideration of angiography.   Current medicines are reviewed with the patient today.  The patient does not have concerns regarding medicines.  Labs/ tests ordered today include:      Orders Placed This Encounter  Procedures  . EKG 12-Lead    Disposition:   FU as outlined above  Signed, Sherren Mocha, MD  01/07/2017 12:01 AM    [facility] [address], [place], [place]  [address] Phone: [phone]; Fax: [phone]

## 2017-01-04 NOTE — Progress Notes (Signed)
Cardiology Office Note Date:  01/07/2017   ID:  Hannah French, DOB Jun 06, 1941, MRN [id number]  PCP:  Worthy Rancher, MD  Cardiologist:  Sherren Mocha, MD    Chief Complaint  Patient presents with  . Rash     History of Present Illness: Hannah French is a 76 y.o. female who presents for evaluation of a lower extremity ulceration.  The patient has been followed for asymptomatic carotid stenosis and lower extremity peripheral arterial disease. She's been statin intolerant. She is a long time smoker. At time of her last office visit here in November 2015 she was clinically stable with no symptoms of claudication.   She was recently seen by her dermatologist for leg ulcerations and is referred for cardiovascular evaluation over concern that her lesions may be related to arterial insufficiency.    Past Medical History:  Diagnosis Date  . Abdominal bruit   . Bladder cancer ([place])   . Carotid bruit   . Diabetes mellitus    Type II  . Dyslipidemia   . Heart murmur   . Mitral valve prolapse   . Osteoporosis   . Pancreatitis, acute   . Pseudogout   . PVD (peripheral vascular disease) ([place])     Past Surgical History:  Procedure Laterality Date  . APPENDECTOMY    . Hysterectomy-type unspecified    . LAPAROSCOPIC CHOLECYSTECTOMY  2009    Current Outpatient Prescriptions  Medication Sig Dispense Refill  . Vitamin D, Ergocalciferol, (DRISDOL) 50000 units CAPS capsule Take 1 capsule (50,000 Units total) by mouth every 7 (seven) days. 12 capsule 3  . B Complex Vitamins (VITAMIN B COMPLEX PO) Take by mouth daily.    . carbamide peroxide (DEBROX) 6.5 % otic solution Use once daily in each ear. About 5 drops in one year while laying with that side. After about 10 minutes turned over to the other side and put 5 drops in that ear and lay with that ear for about 10 minutes. Daily for 10 days then weekly 15 mL 11  . empagliflozin (JARDIANCE) 25 MG TABS tablet Take 25 mg by mouth  daily. 30 tablet 3  . Fluticasone-Salmeterol (ADVAIR) 100-50 MCG/DOSE AEPB Inhale 1 puff into the lungs 2 (two) times daily. 1 each 3  . furosemide (LASIX) 20 MG tablet Take 1 tablet (20 mg total) by mouth as needed. 30 tablet 2  . glimepiride (AMARYL) 4 MG tablet TAKE (1) TABLET DAILY AS DIRECTED. 90 tablet 0  . glucose blood test strip Check blood glucose twice daily and prn 100 each 11  . ibuprofen (ADVIL,MOTRIN) 200 MG tablet Take 200 mg by mouth as needed for headache or moderate pain.     . Lancets (ONETOUCH ULTRASOFT) lancets Test BS BID and PRN.E11.9 100 each 11  . metoprolol succinate (TOPROL-XL) 25 MG 24 hr tablet Take 1 tablet (25 mg total) by mouth daily. 90 tablet 1  . omeprazole (PRILOSEC) 20 MG capsule Take 1 capsule (20 mg total) by mouth daily. 30 capsule 3   No current facility-administered medications for this visit.     Allergies:   Bactrim [sulfamethoxazole-trimethoprim]; Contrast media [iodinated diagnostic agents]; Iohexol; Metformin; Pravastatin; and Rofecoxib   Social History:  The patient  reports that she has been smoking Cigarettes.  She has a 30.00 pack-year smoking history. She has never used smokeless tobacco. She reports that she does not drink alcohol or use drugs.   Family History:  [name]  family history includes Heart attack  in her brother and maternal uncle; Heart failure in her mother; Hypertension in her brother and sister.    ROS:  Please see the history of present illness.  Otherwise, review of systems is positive for hearing loss, easy bruising.  All other systems are reviewed and negative.    PHYSICAL EXAM: VS:  BP 110/70   Pulse (!) 107   Ht 5' 1.5" (1.562 m)   Wt 126 lb (57.2 kg)   SpO2 96%   BMI 23.42 kg/m  , BMI Body mass index is 23.42 kg/m. GEN: Well nourished, well developed, in no acute distress  HEENT: normal  Neck: no JVD, no masses. bilateral carotid bruits Cardiac: RRR with 3/6 harsh systolic murmur LUSB/subclavian area             Respiratory:  clear to auscultation bilaterally, normal work of breathing GI: soft, nontender, nondistended, + BS MS: no deformity or atrophy  Ext: 1+ pretibial/pedal edema, dependent rubor both feet Skin: warm and dry, skin lesions well circumscribed with ulceration left anterior shin and right posterior calf, nontender Neuro:  Strength and sensation are intact Psych: euthymic mood, full affect  EKG:  EKG is ordered today. The ekg ordered today shows NSR 98 bpm, age-indeterminate septal infarct  Recent Labs: 02/29/2016: Hemoglobin 14.6; Platelets 241 08/25/2016: ALT 10; BUN 16; Creatinine, Ser 0.75; Potassium 4.4; Sodium 142   Lipid Panel     Component Value Date/Time   CHOL 155 08/25/2016 0830   TRIG 64 08/25/2016 0830   TRIG 82 06/09/2014 1028   HDL 53 08/25/2016 0830   HDL 57 06/09/2014 1028   CHOLHDL 2.9 08/25/2016 0830   CHOLHDL 3.5 02/06/2013 0932   VLDL 20 02/06/2013 0932   LDLCALC 89 08/25/2016 0830   LDLCALC 131 (H) 06/09/2014 1028      Wt Readings from Last 3 Encounters:  01/04/17 126 lb (57.2 kg)  12/26/16 129 lb 9.6 oz (58.8 kg)  10/07/16 127 lb (57.6 kg)     Cardiac Studies Reviewed: Carotid US 03/03/16: Impressions Duplex imaging, with color Doppler, of the carotid arteries reveals heterogenous plaque in the CCA, ICA and ECA, bilaterally. The RICA velocities are elevated and stable as compared to prior exam. The LICA velocities are elevated and have increased from prior exam. The right subclavian artery is widely patent with normal velocities. The left subclavian artery velocities are elevated. The right vertebral artery is patent with antegrade flow. The left vertebral artery has a to and fro pattern. Technologist Notes Plaque Plaque Examination Data cm/s cm/s Stable heterogeneous plaque, bilaterally. 1-39% RICA stenosis. 74-25% LICA stenosis, velocities have increased. Left subclavian stenosis with steal. Right vertebral artery is patent and  antegrade. Left vertebral artery with to and fro flow. f/u 1 year.  Echo 03-07-2016: Study Conclusions  - Left ventricle: The cavity size was normal. Wall thickness was   normal. Systolic function was normal. The estimated ejection   fraction was in the range of 60% to 65%. Wall motion was normal;   there were no regional wall motion abnormalities. Doppler   parameters are consistent with abnormal left ventricular   relaxation (grade 1 diastolic dysfunction). The E/e&' ratio is   between 8-15, suggesting indeterminate LV filling pressure. - Mitral valve: Mildly thickened leaflets . There was trivial   regurgitation. - Left atrium: The atrium was normal in size. - Tricuspid valve: There was trivial regurgitation. - Pulmonary arteries: PA peak pressure: 21 mm Hg (S). - Inferior vena cava: The vessel was normal  in size. The   respirophasic diameter changes were in the normal range (= 50%),   consistent with normal central venous pressure.  Impressions:  - Compared to a prior echo in 2010, the LVEF is higher at 60-65%.  ABI Study 03/03/16: Impressions Known severe bilateral common iliac artery stenosis. Bilateral ABI's remain within normal range. Normal great toe-brachial indices, bilaterally. Abnormal great toe-brachial indices, bilaterally, although not at risk or tissue loss. See Aorto-iliac Duplex study.  ASSESSMENT AND PLAN: 1.  PAD with ulceration: not clear to me whether lower extremity lesions are arterial in etiology. Pt with known severe iliac disease with peak systolic velocities > 4 m/s bilaterally. Previously managed medically with no associated symptoms. Now with skin lesions, dependent rubor, edema, and atypical leg pain. Recommend updated ABI, aorto-iliac duplex, lower extremity arterial duplex, and PV consultation. Studies will be arranged early next week.   2. Left subclavian stenosis: asymptomatic. Loud bruit on exam. Clinical FU/medical therapy  appropriate.  3. Tobacco abuse: cessation counseling done. Longstanding smoker.   4. Type II DM: treated with oral hypoglycemics. Followed by PCP.   Pt at high risk with heavy tobacco use and diabetes. Referral for vascular ultrasound studies and PV consultation for consideration of angiography.   Current medicines are reviewed with the patient today.  The patient does not have concerns regarding medicines.  Labs/ tests ordered today include:   Orders Placed This Encounter  Procedures  . EKG 12-Lead    Disposition:   FU as outlined above  Signed, Sherren Mocha, MD  01/07/2017 12:01 AM    [facility] [address], [place], [place]  [address] Phone: [phone]; Fax: [phone]

## 2017-01-04 NOTE — Patient Instructions (Addendum)
Medication Instructions:  Your physician recommends that you continue on your current medications as directed. Please refer to the Current Medication list given to you today.  Labwork: No new orders.   Testing/Procedures: Monday, January 08, 2017 at 8:00 AM, [place] 2nd floor Your physician has requested that you have a lower extremity arterial duplex and ABI. This test is an ultrasound of the arteries in the legs. It looks at arterial blood flow in the legs. Allow one hour for Lower Arterial scans. There are no restrictions or special instructions  Your physician has requested that you have an aorto-iliac duplex. During this test, an ultrasound is used to evaluate the aorta and iliac arteries. Do not eat after midnight the day before and avoid carbonated beverages.  Follow-Up: You have been referred to Dr Fletcher Anon for [facility] evaluation ASAP. Tuesday, April 17 at 12:00   Any Other Special Instructions Will Be Listed Below (If Applicable).     If you need a refill on your cardiac medications before your next appointment, please call your pharmacy.

## 2017-01-04 NOTE — Telephone Encounter (Signed)
NOTES SENT TO SCHEDULING.  °

## 2017-01-08 ENCOUNTER — Ambulatory Visit (HOSPITAL_COMMUNITY)
Admission: RE | Admit: 2017-01-08 | Discharge: 2017-01-08 | Disposition: A | Discharge status: Home / Self Care | Payer: Medicare Other | Source: Ambulatory Visit | Attending: Cardiology | Admitting: Cardiology

## 2017-01-08 ENCOUNTER — Ambulatory Visit (HOSPITAL_COMMUNITY)
Admission: RE | Admit: 2017-01-08 | Discharge: 2017-01-08 | Disposition: A | Discharge status: Home / Self Care | Payer: Medicare Other | Source: Ambulatory Visit | Attending: Cardiovascular Disease | Admitting: Cardiovascular Disease

## 2017-01-08 DIAGNOSIS — I708 Atherosclerosis of other arteries: Secondary | ICD-10-CM | POA: Insufficient documentation

## 2017-01-08 DIAGNOSIS — R938 Abnormal findings on diagnostic imaging of other specified body structures: Secondary | ICD-10-CM | POA: Insufficient documentation

## 2017-01-08 DIAGNOSIS — I10 Essential (primary) hypertension: Secondary | ICD-10-CM | POA: Diagnosis not present

## 2017-01-08 DIAGNOSIS — E1151 Type 2 diabetes mellitus with diabetic peripheral angiopathy without gangrene: Secondary | ICD-10-CM | POA: Insufficient documentation

## 2017-01-08 DIAGNOSIS — I739 Peripheral vascular disease, unspecified: Secondary | ICD-10-CM | POA: Diagnosis not present

## 2017-01-08 DIAGNOSIS — Z720 Tobacco use: Secondary | ICD-10-CM | POA: Diagnosis not present

## 2017-01-09 ENCOUNTER — Encounter: Payer: Self-pay | Admitting: Cardiovascular Disease

## 2017-01-09 ENCOUNTER — Ambulatory Visit (INDEPENDENT_AMBULATORY_CARE_PROVIDER_SITE_OTHER): Payer: Medicare Other | Admitting: Cardiovascular Disease

## 2017-01-09 VITALS — BP 172/78 | HR 82 | Ht 61.5 in | Wt 130.2 lb

## 2017-01-09 DIAGNOSIS — I739 Peripheral vascular disease, unspecified: Secondary | ICD-10-CM | POA: Diagnosis not present

## 2017-01-09 NOTE — Patient Instructions (Signed)
Medication Instructions:  Your physician recommends that you continue on your current medications as directed. Please refer to the Current Medication list given to you today.  Labwork: No new orders.   Testing/Procedures: No new orders.   Follow-Up: Your physician recommends that you schedule a follow-up appointment as needed with Dr Fletcher Anon.  See Dermatology and have a 73mm punch biopsy performed   Any Other Special Instructions Will Be Listed Below (If Applicable).     If you need a refill on your cardiac medications before your next appointment, please call your pharmacy.

## 2017-01-09 NOTE — Progress Notes (Signed)
Cardiology Office Note   Date:  01/09/2017   ID:  Hannah French, DOB 11-Jun-1941, MRN [id number]  PCP:  Worthy Rancher, MD  Cardiologist:  Dr. Burt Knack  Chief Complaint  Patient presents with  . Shortness of Breath    pt states every once in a while or if she over exerts herself   . Edema    states from her knees down       History of Present Illness: Hannah French is a 76 y.o. female who Was referred by Dr. Burt Knack for evaluation and management of peripheral arterial disease. The patient has known history of peripheral arterial disease with asymptomatic severe bilateral iliac stenosis and asymptomatic left subclavian artery stenosis. She has prolonged history of tobacco use and diabetes. She developed painful lesions on both legs about 3 months ago. These lesions started as a reddish discoloration that becomes darker and then with resolution. These lesions aren't happening anywhere from her thighs all the way down to the ankles. They are painful. She reports no worsening exertional leg pain. She saw dermatology and was referred for vascular evaluation. However, a biopsy was not performed. She has prior history of bladder cancer that was treated. There is no history of hepatitis C, systemic illness or vasculitis.   Past Medical History:  Diagnosis Date  . Abdominal bruit   . Bladder cancer ([place])   . Carotid bruit   . Diabetes mellitus    Type II  . Dyslipidemia   . Heart murmur   . Mitral valve prolapse   . Osteoporosis   . Pancreatitis, acute   . Pseudogout   . PVD (peripheral vascular disease) ([place])     Past Surgical History:  Procedure Laterality Date  . APPENDECTOMY    . Hysterectomy-type unspecified    . LAPAROSCOPIC CHOLECYSTECTOMY  2009     Current Outpatient Prescriptions  Medication Sig Dispense Refill  . B Complex Vitamins (VITAMIN B COMPLEX PO) Take by mouth daily.    . Fluticasone-Salmeterol (ADVAIR) 100-50 MCG/DOSE AEPB Inhale 1 puff into the  lungs 2 (two) times daily. 1 each 3  . furosemide (LASIX) 20 MG tablet Take 1 tablet (20 mg total) by mouth as needed. 30 tablet 2  . glimepiride (AMARYL) 4 MG tablet TAKE (1) TABLET DAILY AS DIRECTED. 90 tablet 0  . glucose blood test strip Check blood glucose twice daily and prn 100 each 11  . ibuprofen (ADVIL,MOTRIN) 200 MG tablet Take 200 mg by mouth as needed for headache or moderate pain.     . Lancets (ONETOUCH ULTRASOFT) lancets Test BS BID and PRN.E11.9 100 each 11  . metoprolol succinate (TOPROL-XL) 25 MG 24 hr tablet Take 1 tablet (25 mg total) by mouth daily. 90 tablet 1  . omeprazole (PRILOSEC) 20 MG capsule Take 1 capsule (20 mg total) by mouth daily. 30 capsule 3  . Vitamin D, Ergocalciferol, (DRISDOL) 50000 units CAPS capsule Take 1 capsule (50,000 Units total) by mouth every 7 (seven) days. 12 capsule 3   No current facility-administered medications for this visit.     Allergies:   Bactrim [sulfamethoxazole-trimethoprim]; Contrast media [iodinated diagnostic agents]; Iohexol; Metformin; Pravastatin; and Rofecoxib    Social History:  The patient  reports that she has been smoking Cigarettes.  She has a 30.00 pack-year smoking history. She has never used smokeless tobacco. She reports that she does not drink alcohol or use drugs.   Family History:  [name] family history includes Heart attack  in her brother and maternal uncle; Heart failure in her mother; Hypertension in her brother and sister.    ROS:  Please see the history of present illness.   Otherwise, review of systems are positive for none.   All other systems are reviewed and negative.    PHYSICAL EXAM: VS:  BP (!) 172/78 (BP Location: Right Arm, Patient Position: Sitting, Cuff Size: Normal)   Pulse 82   Ht 5' 1.5" (1.562 m)   Wt 130 lb 3.2 oz (59.1 kg)   SpO2 96%   BMI 24.20 kg/m  , BMI Body mass index is 24.2 kg/m. GEN: Well nourished, well developed, in no acute distress  HEENT: normal  Neck: no  JVD, or masses. Bilateral carotid bruits Cardiac: RRR; no rubs, or gallops,no edema . 3/6 systolic ejection murmur in the aortic area Respiratory:  clear to auscultation bilaterally, normal work of breathing GI: soft, nontender, nondistended, + BS MS: no deformity or atrophy  Skin: warm and dry, no rash Neuro:  Strength and sensation are intact Psych: euthymic mood, full affect Vascular: Femoral pulses are normal bilaterally. Distal pulses are palpable but diminished.  EKG:  EKG is not ordered today.    Recent Labs: 02/29/2016: Hemoglobin 14.6; Platelets 241 08/25/2016: ALT 10; BUN 16; Creatinine, Ser 0.75; Potassium 4.4; Sodium 142    Lipid Panel    Component Value Date/Time   CHOL 155 08/25/2016 0830   TRIG 64 08/25/2016 0830   TRIG 82 06/09/2014 1028   HDL 53 08/25/2016 0830   HDL 57 06/09/2014 1028   CHOLHDL 2.9 08/25/2016 0830   CHOLHDL 3.5 02/06/2013 0932   VLDL 20 02/06/2013 0932   LDLCALC 89 08/25/2016 0830   LDLCALC 131 (H) 06/09/2014 1028      Wt Readings from Last 3 Encounters:  01/09/17 130 lb 3.2 oz (59.1 kg)  01/04/17 126 lb (57.2 kg)  12/26/16 129 lb 9.6 oz (58.8 kg)      Other studies Reviewed: Additional studies/ records that were reviewed today include: ABI. Review of the above records demonstrates: Normal bilaterally with stable iliac disease  No flowsheet data found.    ASSESSMENT AND PLAN:  1.  Peripheral arterial disease: The patient is known to have severe bilateral iliac stenosis but she has been minimally symptomatic with this. Her recent vascular studies are reassuring and ABI remains normal. I do not think her current lesions are related to peripheral arterial disease. They are not suggestive of arterial ulceration nor arterial embolization. Thus, I recommend continuing medical therapy.  2. Bilateral skin lesions: Highly suggestive of vasculitis. I recommend skin biopsy and possible evaluation by rheumatology to check for systemic  illnesses.  3. Left subclavian artery stenosis: She continues to be asymptomatic.  Disposition:   FU with me as needed.   Signed,  Kathlyn Sacramento, MD  01/09/2017 11:32 AM    [place]

## 2017-01-10 ENCOUNTER — Other Ambulatory Visit: Payer: Self-pay | Admitting: Dermatology

## 2017-01-10 DIAGNOSIS — L959 Vasculitis limited to the skin, unspecified: Secondary | ICD-10-CM | POA: Diagnosis not present

## 2017-01-10 DIAGNOSIS — L958 Other vasculitis limited to the skin: Secondary | ICD-10-CM | POA: Diagnosis not present

## 2017-01-15 ENCOUNTER — Other Ambulatory Visit: Payer: Medicare Other

## 2017-01-15 DIAGNOSIS — I776 Arteritis, unspecified: Secondary | ICD-10-CM | POA: Diagnosis not present

## 2017-01-16 ENCOUNTER — Telehealth: Payer: Self-pay | Admitting: Cardiovascular Disease

## 2017-01-16 NOTE — Telephone Encounter (Signed)
I contacted the Hannah French and she did have biopsy performed which confirmed diagnosis of vasculitis.  The Hannah French needs to know what doctor she should see now. Per Dr Fletcher Anon the Hannah French needs to see a rheumatologist and he recommended Dr Sabino Niemann.  I provided the Hannah French with office phone number and she plans to obtain her records from dermatologist so that they can be entered into the [facility] system. Pathology report is already scanned into Epic.  The Hannah French will contact me if she has any issues making an appointment.

## 2017-01-16 NOTE — Telephone Encounter (Signed)
New message      Calling to get a recommendation for a doctor who treats vascularitis.  Dermatologist told her she had this.

## 2017-01-17 ENCOUNTER — Telehealth: Payer: Self-pay | Admitting: Cardiovascular Disease

## 2017-01-17 NOTE — Telephone Encounter (Signed)
Will route this information to Dr Fletcher Anon and RN, for further follow-up with referral to Dr Audelia Hives office.  Correct fax number to Dr Audelia Hives office is documented in this encounter.

## 2017-01-17 NOTE — Telephone Encounter (Signed)
F/U Call:  Patient calling states that our office was sending a referral to Dr.Syed's office and his office states that they did not receive the fax. Patient called to give a different fax number, [phone]. Thanks.

## 2017-01-18 ENCOUNTER — Telehealth: Payer: Self-pay | Admitting: Cardiovascular Disease

## 2017-01-18 DIAGNOSIS — I776 Arteritis, unspecified: Secondary | ICD-10-CM

## 2017-01-18 NOTE — Telephone Encounter (Signed)
Would you please fax her a copy of pt's insurance card. Dr Fletcher Anon referred pt ,the fax is [phone].

## 2017-01-18 NOTE — Telephone Encounter (Signed)
Referral entered into EPIC for Rheumatology (Dr Dossie Der).  I have faxed the pt's records to [phone] and made the pt aware of this information.  She will contact the office with any additional questions or concerns.

## 2017-01-18 NOTE — Telephone Encounter (Signed)
New message       Calling to let the nurse know that [facility] did not get the referral for pt to see Dr Dossie Der.  Please refax referral to [phone]. They will not see her without the referral

## 2017-01-18 NOTE — Telephone Encounter (Signed)
Referral has been faxed.

## 2017-01-22 ENCOUNTER — Telehealth: Payer: Self-pay | Admitting: Family Medicine

## 2017-01-22 DIAGNOSIS — R21 Rash and other nonspecific skin eruption: Secondary | ICD-10-CM | POA: Diagnosis not present

## 2017-01-22 DIAGNOSIS — I776 Arteritis, unspecified: Secondary | ICD-10-CM | POA: Diagnosis not present

## 2017-01-22 DIAGNOSIS — M79606 Pain in leg, unspecified: Secondary | ICD-10-CM | POA: Diagnosis not present

## 2017-01-22 DIAGNOSIS — M7989 Other specified soft tissue disorders: Secondary | ICD-10-CM | POA: Diagnosis not present

## 2017-01-22 MED ORDER — GLIMEPIRIDE 4 MG PO TABS: ORAL_TABLET | ORAL | 0 refills | Status: DC

## 2017-01-22 NOTE — Telephone Encounter (Signed)
Refill sent to the pharmacy per patients request

## 2017-01-22 NOTE — Telephone Encounter (Signed)
What is the name of the medication? Glimepiride 4mg  90 day   Have you contacted your pharmacy to request a refill? no  Which pharmacy would you like this sent to? [facility]   Patient notified that their request is being sent to the clinical staff for review and that they should receive a call once it is complete. If they do not receive a call within 24 hours they can check with their pharmacy or our office.

## 2017-02-05 DIAGNOSIS — M79606 Pain in leg, unspecified: Secondary | ICD-10-CM | POA: Diagnosis not present

## 2017-02-05 DIAGNOSIS — I776 Arteritis, unspecified: Secondary | ICD-10-CM | POA: Diagnosis not present

## 2017-02-05 DIAGNOSIS — M7989 Other specified soft tissue disorders: Secondary | ICD-10-CM | POA: Diagnosis not present

## 2017-02-07 ENCOUNTER — Encounter: Payer: Self-pay | Admitting: Family Medicine

## 2017-02-07 ENCOUNTER — Ambulatory Visit (INDEPENDENT_AMBULATORY_CARE_PROVIDER_SITE_OTHER): Payer: Medicare Other | Admitting: Family Medicine

## 2017-02-07 VITALS — BP 97/71 | HR 110 | Temp 97.2°F | Ht 61.5 in | Wt 123.0 lb

## 2017-02-07 DIAGNOSIS — I776 Arteritis, unspecified: Secondary | ICD-10-CM | POA: Diagnosis not present

## 2017-02-07 DIAGNOSIS — R739 Hyperglycemia, unspecified: Secondary | ICD-10-CM

## 2017-02-07 DIAGNOSIS — E119 Type 2 diabetes mellitus without complications: Secondary | ICD-10-CM

## 2017-02-07 DIAGNOSIS — T380X5A Adverse effect of glucocorticoids and synthetic analogues, initial encounter: Secondary | ICD-10-CM

## 2017-02-07 DIAGNOSIS — E785 Hyperlipidemia, unspecified: Secondary | ICD-10-CM | POA: Diagnosis not present

## 2017-02-07 MED ORDER — INSULIN GLARGINE 100 UNIT/ML SOLOSTAR PEN: PEN_INJECTOR | SUBCUTANEOUS | 99 refills | Status: DC

## 2017-02-07 NOTE — Progress Notes (Signed)
Subjective:  Patient ID: Hannah French, female    DOB: 1941/02/20  Age: 76 y.o. 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 First noted as a rash on the right leg resembling shingles. Started on prednisone. Plan is to take it for 3 months according to rheumatology. Until then no problems with elevated glucose.  Now having glucose 300-500. Denies changes in vision, nausea, vomiting or other sx of hyperglycemia.   History Hannah French has a past medical history of Abdominal bruit; Bladder cancer ([place]); Carotid bruit; Diabetes mellitus; Dyslipidemia; Heart murmur; Mitral valve prolapse; Osteoporosis; Pancreatitis, acute; Pseudogout; and PVD (peripheral vascular disease) ([place]).   She has a past surgical history that includes Laparoscopic cholecystectomy (2009); Appendectomy; and Hysterectomy-type unspecified.   Her family history includes Heart attack in her brother and maternal uncle; Heart failure in her mother; Hypertension in her brother and sister.She reports that she has been smoking Cigarettes.  She has a 30.00 pack-year smoking history. She has never used smokeless tobacco. She reports that she does not drink alcohol or use drugs.    ROS Review of Systems  Constitutional: Negative for activity change, appetite change and fever.  HENT: Negative for congestion, rhinorrhea and sore throat.   Eyes: Negative for visual disturbance.  Respiratory: Negative for cough and shortness of breath.   Cardiovascular: Negative for chest pain and palpitations.  Gastrointestinal: Negative for abdominal pain, diarrhea and nausea.  Genitourinary: Negative for dysuria.  Musculoskeletal: Negative for arthralgias.    Allergic/Immunologic: Negative.     Objective:  BP 97/71   Pulse (!) 110   Temp 97.2 F (36.2 C) (Oral)   Ht 5' 1.5" (1.562 m)   Wt 123 lb (55.8 kg)   BMI 22.86 kg/m   BP Readings from Last 3 Encounters:  02/07/17 97/71  01/09/17 (!) 172/78  01/04/17 110/70    Wt Readings from Last 3 Encounters:  02/07/17 123 lb (55.8 kg)  01/09/17 130 lb 3.2 oz (59.1 kg)  01/04/17 126 lb (57.2 kg)     Physical Exam  Constitutional: She appears well-developed and well-nourished. No distress.  HENT:  Head: Normocephalic.  Eyes: EOM are normal. Pupils are equal, round, and reactive to light.  Neck: Neck supple.  Cardiovascular: Normal rate, regular rhythm and intact distal pulses.   Pulmonary/Chest: Breath sounds normal.  Abdominal: Soft. There is no tenderness.  Musculoskeletal: Normal range of motion. She exhibits no edema.  Skin: Skin is warm and dry.  Vasculogenic eruption scattered over calves bilaterally. Now forming into eschars - see below.       Leukocytoclastic vasculitis  Assessment & Plan:   Hannah French was seen today for hyperglycemia.  Diagnoses and all orders for this visit:  Type 2 diabetes mellitus without complication, without long-term current use of insulin (HCC)  Hyperlipidemia with target LDL less than 70  Arteritis (HCC)  Steroid-induced hyperglycemia  Other orders -     Insulin Glargine (LANTUS SOLOSTAR) 100 UNIT/ML Solostar Pen; Start with 20 units at bedtime. Check blood sugar nightly. If your sugar is over 200 increase your dose by 10 more units.       I am having [name] start on Insulin Glargine. I am also  having her maintain her ibuprofen, B Complex Vitamins (VITAMIN B COMPLEX PO), omeprazole, furosemide, Fluticasone-Salmeterol, Vitamin D (Ergocalciferol), metoprolol succinate, glucose blood, onetouch ultrasoft, and glimepiride.  Allergies as of 02/07/2017      Reactions   Bactrim [sulfamethoxazole-trimethoprim] Nausea And Vomiting    Contrast Media [iodinated Diagnostic Agents] Shortness Of Breath   Per pt   Iohexol Other (See Comments)   PASSED OUT DURING THE TEST   Metformin Swelling   Pravastatin Swelling   Myalgia   Rofecoxib Other (See Comments)   UNKNOWN TO PATIENT      Medication List       Accurate as of 02/07/17 11:59 PM. Always use your most recent med list.          Fluticasone-Salmeterol 100-50 MCG/DOSE Aepb Commonly known as:  ADVAIR Inhale 1 puff into the lungs 2 (two) times daily.   furosemide 20 MG tablet Commonly known as:  LASIX Take 1 tablet (20 mg total) by mouth as needed.   glimepiride 4 MG tablet Commonly known as:  AMARYL TAKE (1) TABLET DAILY AS DIRECTED.   glucose blood test strip Check blood glucose twice daily and prn   ibuprofen 200 MG tablet Commonly known as:  ADVIL,MOTRIN Take 200 mg by mouth as needed for headache or moderate pain.   Insulin Glargine 100 UNIT/ML Solostar Pen Commonly known as:  LANTUS SOLOSTAR Start with 20 units at bedtime. Check blood sugar nightly. If your sugar is over 200 increase your dose by 10 more units.   metoprolol succinate 25 MG 24 hr tablet Commonly known as:  TOPROL-XL Take 1 tablet (25 mg total) by mouth daily.   omeprazole 20 MG capsule Commonly known as:  PRILOSEC Take 1 capsule (20 mg total) by mouth daily.   onetouch ultrasoft lancets Test BS BID and PRN.E11.9   VITAMIN B COMPLEX PO Take by mouth daily.   Vitamin D (Ergocalciferol) 50000 units Caps capsule Commonly known as:  DRISDOL Take 1 capsule (50,000 Units total) by mouth every 7 (seven) days.      Start with 20 units of insulin tonight. Check glucose twice daily. If the evening number is over 200 increase your insulin by 10 units. The new dose become sure standard dose until your sugar is again over 200 on another night. The next time it's over 200 he will add 10 units and that become severe standard dose. In short you will go from 20 units to 30 units to 40  units and beyond until your sugar is consistently under 200  Follow-up: Return in about 1 month (around 03/10/2017), or if symptoms worsen or fail to improve, for diabetes.  Claretta Fraise, M.D.

## 2017-02-07 NOTE — Patient Instructions (Signed)
Start with 20 units of insulin tonight. Check glucose twice daily. If the evening number is over 200 increase your insulin by 10 units. The new dose become sure standard dose until your sugar is again over 200 on another night. The next time it's over 200 he will add 10 units and that become severe standard dose. In short you will go from 20 units to 30 units to 40 units and beyond until your sugar is consistently under 200

## 2017-02-08 ENCOUNTER — Ambulatory Visit: Payer: Medicare Other | Admitting: Family

## 2017-02-08 ENCOUNTER — Telehealth: Payer: Self-pay | Admitting: Family Medicine

## 2017-02-08 NOTE — Telephone Encounter (Signed)
Spoke to Dr Dettinger regarding pt's BS Per Dr Warrick Parisian, pt needs appt today for labs and recheck appt scheduled Pt notified

## 2017-02-09 ENCOUNTER — Ambulatory Visit (INDEPENDENT_AMBULATORY_CARE_PROVIDER_SITE_OTHER): Payer: Medicare Other | Admitting: Family Medicine

## 2017-02-09 ENCOUNTER — Encounter: Payer: Self-pay | Admitting: Family Medicine

## 2017-02-09 VITALS — BP 107/62 | HR 78 | Temp 97.8°F | Ht 61.5 in | Wt 124.0 lb

## 2017-02-09 DIAGNOSIS — E162 Hypoglycemia, unspecified: Secondary | ICD-10-CM | POA: Diagnosis not present

## 2017-02-09 MED ORDER — INSULIN LISPRO 100 UNIT/ML CARTRIDGE: SUBCUTANEOUS | 11 refills | Status: DC

## 2017-02-09 NOTE — Progress Notes (Signed)
Subjective:  Patient ID: Hannah French, female    DOB: 1941/07/07  Age: 76 y.o. MRN: [id number]  CC: Hypoglycemia (pt here today c/o waking up yesterday morning with a FBS of 47 and she had been given 20 units of Tresiba in the office around 2:30 the day prior. )   HPI FARYN SIEG presents for Noted above symptoms of sugar dropping to 47. The Tyler Aas had been given approximately 18 hours earlier. She was having hypoglycemic symptoms of feeling faint and nauseous. This was relieved by taking 2 teaspoons of syrup followed by a cup of yogurt. Later in the day after taking prednisone her sugar again climbed to 480. Then at suppertime she took her usual dose of Amaryl and this morning her glucose is 113.     ROS Review of Systems Noncontributory Objective:  BP 107/62   Pulse 78   Temp 97.8 F (36.6 C) (Oral)   Ht 5' 1.5" (1.562 m)   Wt 124 lb (56.2 kg)   BMI 23.05 kg/m   BP Readings from Last 3 Encounters:  02/09/17 107/62  02/07/17 97/71  01/09/17 (!) 172/78    Wt Readings from Last 3 Encounters:  02/09/17 124 lb (56.2 kg)  02/07/17 123 lb (55.8 kg)  01/09/17 130 lb 3.2 oz (59.1 kg)     Physical Exam  Constitutional: She is oriented to person, place, and time. She appears well-developed and well-nourished. No distress.  Musculoskeletal: Normal range of motion.  Neurological: She is alert and oriented to person, place, and time.  Skin: Skin is warm and dry.  Psychiatric: She has a normal mood and affect.      Assessment & Plan:   [name] was seen today for hypoglycemia.  Diagnoses and all orders for this visit:  Hypoglycemia  Other orders -     insulin lispro (HUMALOG) 100 UNIT/ML cartridge; Use sliding scale as provided     15 minutes was spent with this patient. All of which was spent in counseling regarding her hypoglycemic reaction, adjustment of her insulin using a sliding scale and proper response to hypoglycemic reaction.  I have discontinued Ms.  Conklin's Insulin Glargine. I am also having her start on insulin lispro. Additionally, I am having her maintain her ibuprofen, B Complex Vitamins (VITAMIN B COMPLEX PO), omeprazole, furosemide, Fluticasone-Salmeterol, Vitamin D (Ergocalciferol), metoprolol succinate, glucose blood, onetouch ultrasoft, and glimepiride.  Allergies as of 02/09/2017      Reactions   Bactrim [sulfamethoxazole-trimethoprim] Nausea And Vomiting   Contrast Media [iodinated Diagnostic Agents] Shortness Of Breath   Per pt   Iohexol Other (See Comments)   PASSED OUT DURING THE TEST   Metformin Swelling   Pravastatin Swelling   Myalgia   Rofecoxib Other (See Comments)   UNKNOWN TO PATIENT      Medication List       Accurate as of 02/09/17 11:15 AM. Always use your most recent med list.          Fluticasone-Salmeterol 100-50 MCG/DOSE Aepb Commonly known as:  ADVAIR Inhale 1 puff into the lungs 2 (two) times daily.   furosemide 20 MG tablet Commonly known as:  LASIX Take 1 tablet (20 mg total) by mouth as needed.   glimepiride 4 MG tablet Commonly known as:  AMARYL TAKE (1) TABLET DAILY AS DIRECTED.   glucose blood test strip Check blood glucose twice daily and prn   ibuprofen 200 MG tablet Commonly known as:  ADVIL,MOTRIN Take 200 mg by mouth as needed  for headache or moderate pain.   insulin lispro 100 UNIT/ML cartridge Commonly known as:  HUMALOG Use sliding scale as provided   metoprolol succinate 25 MG 24 hr tablet Commonly known as:  TOPROL-XL Take 1 tablet (25 mg total) by mouth daily.   omeprazole 20 MG capsule Commonly known as:  PRILOSEC Take 1 capsule (20 mg total) by mouth daily.   onetouch ultrasoft lancets Test BS BID and PRN.E11.9   VITAMIN B COMPLEX PO Take by mouth daily.   Vitamin D (Ergocalciferol) 50000 units Caps capsule Commonly known as:  DRISDOL Take 1 capsule (50,000 Units total) by mouth every 7 (seven) days.      Discontinue Tresiba/glargine  Sliding  scale insulin:  Glucose should be measured before each meal and again at bedtime.  Glucose reading  insulin dose (NovoLog) 0-1 50    0 units 1 51-200   2 units 201-250   3 units 2 51-300   5 units 301-350   7 units 3 51-400   9 units Greater than 400  11 units and notify Provider Follow-up: Return in 7 days (on 02/16/2017).  Claretta Fraise, M.D.

## 2017-02-09 NOTE — Patient Instructions (Signed)
Sliding scale insulin:  Glucose should be measured before each meal and again at bedtime.  Glucose reading  insulin dose (NovoLog) 0-1 50    0 units 1 51-200   2 units 201-250   3 units 2 51-300   5 units 301-350   7 units 3 51-400   9 units Greater than 400  11 units and notify Provider

## 2017-03-12 ENCOUNTER — Ambulatory Visit: Payer: Medicare Other | Admitting: Cardiovascular Disease

## 2017-03-19 DIAGNOSIS — M79606 Pain in leg, unspecified: Secondary | ICD-10-CM | POA: Diagnosis not present

## 2017-03-19 DIAGNOSIS — M7989 Other specified soft tissue disorders: Secondary | ICD-10-CM | POA: Diagnosis not present

## 2017-03-19 DIAGNOSIS — M810 Age-related osteoporosis without current pathological fracture: Secondary | ICD-10-CM | POA: Diagnosis not present

## 2017-03-19 DIAGNOSIS — I776 Arteritis, unspecified: Secondary | ICD-10-CM | POA: Diagnosis not present

## 2017-03-26 ENCOUNTER — Telehealth: Payer: Self-pay | Admitting: Cardiovascular Disease

## 2017-03-26 MED ORDER — METOPROLOL SUCCINATE ER 25 MG PO TB24: 25.0000 mg | ORAL_TABLET | Freq: Every day | ORAL | 2 refills | Status: DC

## 2017-03-26 NOTE — Telephone Encounter (Signed)
New message     *STAT* If patient is at the pharmacy, call can be transferred to refill team.   1. Which medications need to be refilled? (please list name of each medication and dose if known) metoprolol succinate 25 mg  2. Which pharmacy/location (including street and city if local pharmacy) is medication to be sent to? [place]   3. Do they need a 30 day or 90 day supply? 90 day

## 2017-03-26 NOTE — Telephone Encounter (Signed)
Pt's medication was sent to pt's pharmacy as requested. Confirmation received.  °

## 2017-04-04 ENCOUNTER — Encounter: Payer: Self-pay | Admitting: Family Medicine

## 2017-04-04 ENCOUNTER — Ambulatory Visit (INDEPENDENT_AMBULATORY_CARE_PROVIDER_SITE_OTHER): Payer: Medicare Other | Admitting: Family Medicine

## 2017-04-04 VITALS — BP 102/61 | HR 99 | Temp 98.2°F | Ht 61.5 in | Wt 125.0 lb

## 2017-04-04 DIAGNOSIS — R232 Flushing: Secondary | ICD-10-CM

## 2017-04-04 DIAGNOSIS — J441 Chronic obstructive pulmonary disease with (acute) exacerbation: Secondary | ICD-10-CM

## 2017-04-04 DIAGNOSIS — E119 Type 2 diabetes mellitus without complications: Secondary | ICD-10-CM

## 2017-04-04 MED ORDER — FLUTICASONE-SALMETEROL 100-50 MCG/DOSE IN AEPB: 1.0000 | INHALATION_SPRAY | Freq: Two times a day (BID) | RESPIRATORY_TRACT | 3 refills | Status: DC

## 2017-04-04 MED ORDER — INSULIN DEGLUDEC 100 UNIT/ML ~~LOC~~ SOPN: 5.0000 [IU] | PEN_INJECTOR | Freq: Every day | SUBCUTANEOUS | 2 refills | Status: DC

## 2017-04-04 NOTE — Progress Notes (Signed)
BP 102/61   Pulse 99   Temp 98.2 F (36.8 C) (Oral)   Ht 5' 1.5" (1.562 m)   Wt 125 lb (56.7 kg)   BMI 23.24 kg/m    Subjective:    Patient ID: Hannah French, female    DOB: [birth year], 76 y.o.   MRN: [id number]  HPI: Hannah French is a 76 y.o. female presenting on 04/04/2017 for Medication Management (pt here today c/o insulin reaction. She started taking Lantus 6 days ago and since then has had sweats, SOB and a rash on the right hip. Pt states BS was running in the 300's when she was feeling terrible.)   HPI Diabetes recheck Patient has type 2 diabetes that has recently become exacerbated and elevated because she is on prednisone for vasculitis. She was recently started on tresiba with a sample at 20 units and had hypoglycemia down to 47 about 1 month ago and then she backed off on the tresiba to only using 5 units daily and says that her blood sugars were running very well but then she ran out and was started on Lantus 5 units 6 days ago. She says since she's been on the Lantus she has flushing and hyperglycemia and is having recurrent episodes like this. She feels flushed and one time she checked her blood sugar while she was having one of these episodes and it was 336. She is still taking her glimepiride  Shortness of breath and cough Patient has been having increased shortness of breath and cough that has been increased from her COPD. She does admit that she ran out of her Advair and has not had it in a little while and that may be why she is flaring up. Has been worsening over the past few weeks. She denies any fevers or chills. Her cough is been mostly nonproductive. She denies any sick contacts that she knows of. She has not used anything for the symptoms just yet. She does not feel like they're worsening or getting better but they are just constant.  Relevant past medical, surgical, family and social history reviewed and updated as indicated. Interim medical history since our  last visit reviewed. Allergies and medications reviewed and updated.  Review of Systems  Constitutional: Negative for chills and fever.  HENT: Positive for congestion. Negative for ear discharge, ear pain and sinus pain.   Eyes: Negative for redness and visual disturbance.  Respiratory: Positive for cough, shortness of breath and wheezing. Negative for chest tightness.   Cardiovascular: Negative for chest pain and leg swelling.  Endocrine: Positive for heat intolerance.  Genitourinary: Negative for difficulty urinating and dysuria.  Musculoskeletal: Negative for back pain and gait problem.  Skin: Negative for rash.  Neurological: Negative for dizziness, light-headedness and headaches.  Psychiatric/Behavioral: Negative for agitation and behavioral problems.  All other systems reviewed and are negative.   Per HPI unless specifically indicated above        Objective:    BP 102/61   Pulse 99   Temp 98.2 F (36.8 C) (Oral)   Ht 5' 1.5" (1.562 m)   Wt 125 lb (56.7 kg)   BMI 23.24 kg/m   Wt Readings from Last 3 Encounters:  04/04/17 125 lb (56.7 kg)  02/09/17 124 lb (56.2 kg)  02/07/17 123 lb (55.8 kg)    Physical Exam  Constitutional: She is oriented to person, place, and time. She appears well-developed and well-nourished. No distress.  Eyes: Conjunctivae are normal.  Neck: Neck  supple. No thyromegaly present.  Cardiovascular: Normal rate, regular rhythm, normal heart sounds and intact distal pulses.   No murmur heard. Pulmonary/Chest: Effort normal. No respiratory distress. She has wheezes. She has no rales.  Musculoskeletal: Normal range of motion. She exhibits no edema or tenderness.  Lymphadenopathy:    She has no cervical adenopathy.  Neurological: She is alert and oriented to person, place, and time. Coordination normal.  Skin: Skin is warm and dry. No rash noted. She is not diaphoretic.  Psychiatric: She has a normal mood and affect. Her behavior is normal.    Nursing note and vitals reviewed.       Assessment & Plan:   Problem List Items Addressed This Visit      Endocrine   Type 2 diabetes mellitus without complication, without long-term current use of insulin ([place])    Recent start on insulin because she is on prednisone for vasculitis      Relevant Medications   LANTUS SOLOSTAR 100 UNIT/ML Solostar Pen   insulin degludec (TRESIBA FLEXTOUCH) 100 UNIT/ML SOPN FlexTouch Pen    Other Visit Diagnoses    Flushing    -  Primary   COPD exacerbation ([place])       Relevant Medications   Fluticasone-Salmeterol (ADVAIR) 100-50 MCG/DOSE AEPB       Follow up plan: Return in about 3 months (around 07/05/2017), or if symptoms worsen or fail to improve, for Recheck diabetes.  Counseling provided for all of the vaccine components No orders of the defined types were placed in this encounter.   Caryl Pina, MD [facility] 04/04/2017, 1:44 PM

## 2017-04-04 NOTE — Assessment & Plan Note (Signed)
Recent start on insulin because she is on prednisone for vasculitis

## 2017-04-30 DIAGNOSIS — M79606 Pain in leg, unspecified: Secondary | ICD-10-CM | POA: Diagnosis not present

## 2017-04-30 DIAGNOSIS — M7989 Other specified soft tissue disorders: Secondary | ICD-10-CM | POA: Diagnosis not present

## 2017-04-30 DIAGNOSIS — M810 Age-related osteoporosis without current pathological fracture: Secondary | ICD-10-CM | POA: Diagnosis not present

## 2017-04-30 DIAGNOSIS — I776 Arteritis, unspecified: Secondary | ICD-10-CM | POA: Diagnosis not present

## 2017-05-02 ENCOUNTER — Other Ambulatory Visit: Payer: Self-pay | Admitting: Family Medicine

## 2017-05-22 DIAGNOSIS — M810 Age-related osteoporosis without current pathological fracture: Secondary | ICD-10-CM | POA: Diagnosis not present

## 2017-05-22 DIAGNOSIS — I776 Arteritis, unspecified: Secondary | ICD-10-CM | POA: Diagnosis not present

## 2017-05-25 DIAGNOSIS — Z1231 Encounter for screening mammogram for malignant neoplasm of breast: Secondary | ICD-10-CM | POA: Diagnosis not present

## 2017-06-11 DIAGNOSIS — Z79899 Other long term (current) drug therapy: Secondary | ICD-10-CM | POA: Diagnosis not present

## 2017-06-11 DIAGNOSIS — I776 Arteritis, unspecified: Secondary | ICD-10-CM | POA: Diagnosis not present

## 2017-06-11 DIAGNOSIS — M79606 Pain in leg, unspecified: Secondary | ICD-10-CM | POA: Diagnosis not present

## 2017-06-11 DIAGNOSIS — M810 Age-related osteoporosis without current pathological fracture: Secondary | ICD-10-CM | POA: Diagnosis not present

## 2017-07-25 ENCOUNTER — Other Ambulatory Visit: Payer: Self-pay | Admitting: Cardiovascular Disease

## 2017-07-25 DIAGNOSIS — M1711 Unilateral primary osteoarthritis, right knee: Secondary | ICD-10-CM | POA: Diagnosis not present

## 2017-07-25 DIAGNOSIS — I739 Peripheral vascular disease, unspecified: Principal | ICD-10-CM

## 2017-07-25 DIAGNOSIS — M170 Bilateral primary osteoarthritis of knee: Secondary | ICD-10-CM | POA: Diagnosis not present

## 2017-07-25 DIAGNOSIS — I779 Disorder of arteries and arterioles, unspecified: Secondary | ICD-10-CM

## 2017-07-25 DIAGNOSIS — M1712 Unilateral primary osteoarthritis, left knee: Secondary | ICD-10-CM | POA: Diagnosis not present

## 2017-08-01 ENCOUNTER — Ambulatory Visit (INDEPENDENT_AMBULATORY_CARE_PROVIDER_SITE_OTHER): Payer: Medicare Other

## 2017-08-01 DIAGNOSIS — I779 Disorder of arteries and arterioles, unspecified: Secondary | ICD-10-CM

## 2017-08-01 DIAGNOSIS — I739 Peripheral vascular disease, unspecified: Principal | ICD-10-CM

## 2017-08-02 LAB — VAS US CAROTID
LEFT ECA DIAS: 0 cm/s
LEFT VERTEBRAL DIAS: 0 cm/s
Left CCA dist dias: 16 cm/s
Left CCA dist sys: 121 cm/s
Left CCA prox dias: 23 cm/s
Left CCA prox sys: 144 cm/s
Left ICA dist dias: -32 cm/s
Left ICA dist sys: -176 cm/s
Left ICA prox dias: -36 cm/s
Left ICA prox sys: -155 cm/s
RIGHT ECA DIAS: 0 cm/s
RIGHT VERTEBRAL DIAS: 24 cm/s
Right CCA prox dias: 20 cm/s
Right CCA prox sys: 119 cm/s
Right cca dist sys: -165 cm/s

## 2017-08-14 ENCOUNTER — Telehealth: Payer: Self-pay | Admitting: Family Medicine

## 2017-08-14 ENCOUNTER — Ambulatory Visit (INDEPENDENT_AMBULATORY_CARE_PROVIDER_SITE_OTHER): Payer: Medicare Other | Admitting: *Deleted

## 2017-08-14 DIAGNOSIS — Z23 Encounter for immunization: Secondary | ICD-10-CM | POA: Diagnosis not present

## 2017-08-14 NOTE — Telephone Encounter (Signed)
Spoke with patient- she states she had a steroid injection in her knee 3 weeks ago, and wants to make sure there is no problem with her getting a flu shot now.  Advised that it is fine for her to come in to get a flu shot.  Scheduled her for a flu shot today.

## 2017-08-29 ENCOUNTER — Telehealth: Payer: Self-pay | Admitting: Family Medicine

## 2017-08-29 DIAGNOSIS — E1169 Type 2 diabetes mellitus with other specified complication: Secondary | ICD-10-CM

## 2017-08-29 DIAGNOSIS — M810 Age-related osteoporosis without current pathological fracture: Secondary | ICD-10-CM

## 2017-08-29 DIAGNOSIS — E785 Hyperlipidemia, unspecified: Secondary | ICD-10-CM

## 2017-08-29 DIAGNOSIS — Z794 Long term (current) use of insulin: Principal | ICD-10-CM

## 2017-08-29 DIAGNOSIS — E119 Type 2 diabetes mellitus without complications: Secondary | ICD-10-CM

## 2017-08-29 NOTE — Telephone Encounter (Signed)
Wants Vit D added to her lab work

## 2017-08-29 NOTE — Telephone Encounter (Signed)
Pt would like lab orders placed before she is seen on 09/05/17 Would also like a Vit D done

## 2017-08-29 NOTE — Telephone Encounter (Signed)
Pt aware.

## 2017-08-30 ENCOUNTER — Other Ambulatory Visit: Payer: Medicare Other

## 2017-08-30 DIAGNOSIS — Z794 Long term (current) use of insulin: Secondary | ICD-10-CM | POA: Diagnosis not present

## 2017-08-30 DIAGNOSIS — E119 Type 2 diabetes mellitus without complications: Secondary | ICD-10-CM

## 2017-08-30 DIAGNOSIS — M810 Age-related osteoporosis without current pathological fracture: Secondary | ICD-10-CM | POA: Diagnosis not present

## 2017-08-30 DIAGNOSIS — E785 Hyperlipidemia, unspecified: Secondary | ICD-10-CM | POA: Diagnosis not present

## 2017-08-30 DIAGNOSIS — E1169 Type 2 diabetes mellitus with other specified complication: Secondary | ICD-10-CM | POA: Diagnosis not present

## 2017-08-30 LAB — BAYER DCA HB A1C WAIVED: HB A1C (BAYER DCA - WAIVED): 6.9 % (ref <7.0)

## 2017-08-31 LAB — CBC WITH DIFFERENTIAL/PLATELET
Basophils Absolute: 0 10*3/uL (ref 0.0–0.2)
Basos: 1 %
EOS (ABSOLUTE): 0.2 10*3/uL (ref 0.0–0.4)
Eos: 4 %
Hematocrit: 38.5 % (ref 34.0–46.6)
Hemoglobin: 12.9 g/dL (ref 11.1–15.9)
Immature Grans (Abs): 0 10*3/uL (ref 0.0–0.1)
Immature Granulocytes: 0 %
Lymphocytes Absolute: 1.1 10*3/uL (ref 0.7–3.1)
Lymphs: 18 %
MCH: 28.2 pg (ref 26.6–33.0)
MCHC: 33.5 g/dL (ref 31.5–35.7)
MCV: 84 fL (ref 79–97)
Monocytes Absolute: 0.5 10*3/uL (ref 0.1–0.9)
Monocytes: 8 %
Neutrophils Absolute: 4.3 10*3/uL (ref 1.4–7.0)
Neutrophils: 69 %
Platelets: 162 10*3/uL (ref 150–379)
RBC: 4.57 x10E6/uL (ref 3.77–5.28)
RDW: 14.8 % (ref 12.3–15.4)
WBC: 6.2 10*3/uL (ref 3.4–10.8)

## 2017-08-31 LAB — CMP14+EGFR
ALT: 9 IU/L (ref 0–32)
AST: 14 IU/L (ref 0–40)
Albumin/Globulin Ratio: 1.9 (ref 1.2–2.2)
Albumin: 4.1 g/dL (ref 3.5–4.8)
Alkaline Phosphatase: 52 IU/L (ref 39–117)
BUN/Creatinine Ratio: 19 (ref 12–28)
BUN: 16 mg/dL (ref 8–27)
Bilirubin Total: 0.8 mg/dL (ref 0.0–1.2)
CO2: 28 mmol/L (ref 20–29)
Calcium: 9.6 mg/dL (ref 8.7–10.3)
Chloride: 97 mmol/L (ref 96–106)
Creatinine, Ser: 0.85 mg/dL (ref 0.57–1.00)
GFR calc Af Amer: 76 mL/min/{1.73_m2} (ref >59)
GFR calc non Af Amer: 66 mL/min/{1.73_m2} (ref >59)
Globulin, Total: 2.2 g/dL (ref 1.5–4.5)
Glucose: 114 mg/dL — ABNORMAL HIGH (ref 65–99)
Potassium: 4.2 mmol/L (ref 3.5–5.2)
Sodium: 141 mmol/L (ref 134–144)
Total Protein: 6.3 g/dL (ref 6.0–8.5)

## 2017-08-31 LAB — LIPID PANEL
Chol/HDL Ratio: 3.4 ratio (ref 0.0–4.4)
Cholesterol, Total: 169 mg/dL (ref 100–199)
HDL: 50 mg/dL (ref >39)
LDL Calculated: 103 mg/dL — ABNORMAL HIGH (ref 0–99)
Triglycerides: 82 mg/dL (ref 0–149)
VLDL Cholesterol Cal: 16 mg/dL (ref 5–40)

## 2017-08-31 LAB — VITAMIN D 25 HYDROXY (VIT D DEFICIENCY, FRACTURES): Vit D, 25-Hydroxy: 26.7 ng/mL — ABNORMAL LOW (ref 30.0–100.0)

## 2017-09-05 ENCOUNTER — Encounter: Payer: Self-pay | Admitting: Family Medicine

## 2017-09-05 ENCOUNTER — Ambulatory Visit (INDEPENDENT_AMBULATORY_CARE_PROVIDER_SITE_OTHER): Payer: Medicare Other

## 2017-09-05 ENCOUNTER — Ambulatory Visit (INDEPENDENT_AMBULATORY_CARE_PROVIDER_SITE_OTHER): Payer: Medicare Other | Admitting: Family Medicine

## 2017-09-05 VITALS — BP 136/79 | HR 92 | Temp 97.8°F | Ht 61.5 in | Wt 126.0 lb

## 2017-09-05 DIAGNOSIS — E119 Type 2 diabetes mellitus without complications: Secondary | ICD-10-CM

## 2017-09-05 DIAGNOSIS — Z0000 Encounter for general adult medical examination without abnormal findings: Secondary | ICD-10-CM | POA: Diagnosis not present

## 2017-09-05 DIAGNOSIS — J9809 Other diseases of bronchus, not elsewhere classified: Secondary | ICD-10-CM | POA: Diagnosis not present

## 2017-09-05 DIAGNOSIS — E785 Hyperlipidemia, unspecified: Secondary | ICD-10-CM

## 2017-09-05 DIAGNOSIS — Z720 Tobacco use: Secondary | ICD-10-CM

## 2017-09-05 DIAGNOSIS — F17210 Nicotine dependence, cigarettes, uncomplicated: Secondary | ICD-10-CM | POA: Diagnosis not present

## 2017-09-05 DIAGNOSIS — E1169 Type 2 diabetes mellitus with other specified complication: Secondary | ICD-10-CM

## 2017-09-05 MED ORDER — GLIMEPIRIDE 4 MG PO TABS: ORAL_TABLET | ORAL | 1 refills | Status: DC

## 2017-09-05 NOTE — Progress Notes (Signed)
BP 136/79   Pulse 92   Temp 97.8 F (36.6 C) (Oral)   Ht 5' 1.5" (1.562 m)   Wt 126 lb (57.2 kg)   BMI 23.42 kg/m    Subjective:    Patient ID: Hannah French, female    DOB: 19-Sep-1941, 76 y.o.   MRN: [id number]  HPI: Hannah French is a 76 y.o. female presenting on 09/05/2017 for Annual Exam (has not had chest xray in years, requesting to have one today)   HPI  Well adult exam and physical Patient is coming in today for well adult exam and physical and had her labs done a few days ago.  She needs a chest x-ray for smoking history because she has not had one in years.  She denies any new breathing issues or any changes in her medication.  She has been doing okay with her diabetes and the numbers have improved over the past month and a half since she has been off steroids. Patient denies any chest pain, shortness of breath, headaches or vision issues, abdominal complaints, diarrhea, nausea, vomiting, or joint issues.  She has a 30-pack-year smoking history and is still currently smoking at least a pack per day.  Type 2 diabetes mellitus Patient comes in today for recheck of his diabetes, a1c 1 week ago was 6.9 which is improvement from before. Patient has been currently taking glimiperide. Patient is not currently on an ACE inhibitor/ARB. Patient has not seen an ophthalmologist this year. Patient denies any issues with their feet.   Hyperlipidemia Patient is coming in for recheck of his hyperlipidemia. The patient is currently taking diet control and we are monitoring and her labs have improved. They deny any issues with myalgias or history of liver damage from it. They deny any focal numbness or weakness or chest pain.   Relevant past medical, surgical, family and social history reviewed and updated as indicated. Interim medical history since our last visit reviewed. Allergies and medications reviewed and updated.  Review of Systems  Constitutional: Negative for chills and fever.    HENT: Negative for ear pain and tinnitus.   Eyes: Negative for pain.  Respiratory: Negative for cough, shortness of breath and wheezing.   Cardiovascular: Negative for chest pain, palpitations and leg swelling.  Gastrointestinal: Negative for abdominal pain, blood in stool, constipation and diarrhea.  Genitourinary: Negative for dysuria and hematuria.  Musculoskeletal: Negative for back pain and myalgias.  Skin: Negative for rash.  Neurological: Negative for dizziness, weakness and headaches.  Psychiatric/Behavioral: Negative for suicidal ideas.    Per HPI unless specifically indicated above   Allergies as of 09/05/2017      Reactions   Bactrim [sulfamethoxazole-trimethoprim] Nausea And Vomiting   Contrast Media [iodinated Diagnostic Agents] Shortness Of Breath   Per pt   Iohexol Other (See Comments)   PASSED OUT DURING THE TEST   Metformin Swelling   Pravastatin Swelling   Myalgia   Rofecoxib Other (See Comments)   UNKNOWN TO PATIENT      Medication List        Accurate as of 09/05/17  3:10 PM. Always use your most recent med list.          Fluticasone-Salmeterol 100-50 MCG/DOSE Aepb Commonly known as:  ADVAIR Inhale 1 puff into the lungs 2 (two) times daily.   furosemide 20 MG tablet Commonly known as:  LASIX Take 1 tablet (20 mg total) by mouth as needed.   glimepiride 4 MG tablet Commonly known  as:  AMARYL TAKE (1) TABLET DAILY AS DIRECTED.   glucose blood test strip Check blood glucose twice daily and prn   ibuprofen 200 MG tablet Commonly known as:  ADVIL,MOTRIN Take 200 mg by mouth as needed for headache or moderate pain.   metoprolol succinate 25 MG 24 hr tablet Commonly known as:  TOPROL-XL Take 1 tablet (25 mg total) by mouth daily.   omeprazole 20 MG capsule Commonly known as:  PRILOSEC Take 1 capsule (20 mg total) by mouth daily.   onetouch ultrasoft lancets Test BS BID and PRN.E11.9   VITAMIN B COMPLEX PO Take by mouth daily.    Vitamin D (Ergocalciferol) 50000 units Caps capsule Commonly known as:  DRISDOL Take 1 capsule (50,000 Units total) by mouth every 7 (seven) days.          Objective:    BP 136/79   Pulse 92   Temp 97.8 F (36.6 C) (Oral)   Ht 5' 1.5" (1.562 m)   Wt 126 lb (57.2 kg)   BMI 23.42 kg/m   Wt Readings from Last 3 Encounters:  09/05/17 126 lb (57.2 kg)  04/04/17 125 lb (56.7 kg)  02/09/17 124 lb (56.2 kg)    Physical Exam  Constitutional: She is oriented to person, place, and time. She appears well-developed and well-nourished. No distress.  HENT:  Right Ear: External ear normal.  Left Ear: External ear normal.  Nose: Nose normal.  Mouth/Throat: Oropharynx is clear and moist. No oropharyngeal exudate.  Eyes: Conjunctivae are normal.  Neck: Neck supple. No thyromegaly present.  Cardiovascular: Normal rate, regular rhythm, normal heart sounds and intact distal pulses.  No murmur heard. Pulmonary/Chest: Effort normal and breath sounds normal. No respiratory distress. She has no wheezes. She has no rales.  Abdominal: Soft. Bowel sounds are normal. She exhibits no distension. There is no tenderness. There is no rebound and no guarding.  Musculoskeletal: Normal range of motion. She exhibits no edema or tenderness.  Lymphadenopathy:    She has no cervical adenopathy.  Neurological: She is alert and oriented to person, place, and time. Coordination normal.  Skin: Skin is warm and dry. No rash noted. She is not diaphoretic.  Psychiatric: She has a normal mood and affect. Her behavior is normal. Thought content normal.  Nursing note and vitals reviewed.   Results for orders placed or performed in visit on 08/30/17  CBC with Differential/Platelet  Result Value Ref Range   WBC 6.2 3.4 - 10.8 x10E3/uL   RBC 4.57 3.77 - 5.28 x10E6/uL   Hemoglobin 12.9 11.1 - 15.9 g/dL   Hematocrit 38.5 34.0 - 46.6 %   MCV 84 79 - 97 fL   MCH 28.2 26.6 - 33.0 pg   MCHC 33.5 31.5 - 35.7 g/dL   RDW  14.8 12.3 - 15.4 %   Platelets 162 150 - 379 x10E3/uL   Neutrophils 69 Not Estab. %   Lymphs 18 Not Estab. %   Monocytes 8 Not Estab. %   Eos 4 Not Estab. %   Basos 1 Not Estab. %   Neutrophils Absolute 4.3 1.4 - 7.0 x10E3/uL   Lymphocytes Absolute 1.1 0.7 - 3.1 x10E3/uL   Monocytes Absolute 0.5 0.1 - 0.9 x10E3/uL   EOS (ABSOLUTE) 0.2 0.0 - 0.4 x10E3/uL   Basophils Absolute 0.0 0.0 - 0.2 x10E3/uL   Immature Granulocytes 0 Not Estab. %   Immature Grans (Abs) 0.0 0.0 - 0.1 x10E3/uL  VITAMIN D 25 Hydroxy (Vit-D Deficiency, Fractures)  Result  Value Ref Range   Vit D, 25-Hydroxy 26.7 (L) 30.0 - 100.0 ng/mL  Lipid panel  Result Value Ref Range   Cholesterol, Total 169 100 - 199 mg/dL   Triglycerides 82 0 - 149 mg/dL   HDL 50 >39 mg/dL   VLDL Cholesterol Cal 16 5 - 40 mg/dL   LDL Calculated 103 (H) 0 - 99 mg/dL   Chol/HDL Ratio 3.4 0.0 - 4.4 ratio  CMP14+EGFR  Result Value Ref Range   Glucose 114 (H) 65 - 99 mg/dL   BUN 16 8 - 27 mg/dL   Creatinine, Ser 0.85 0.57 - 1.00 mg/dL   GFR calc non Af Amer 66 >59 mL/min/1.73   GFR calc Af Amer 76 >59 mL/min/1.73   BUN/Creatinine Ratio 19 12 - 28   Sodium 141 134 - 144 mmol/L   Potassium 4.2 3.5 - 5.2 mmol/L   Chloride 97 96 - 106 mmol/L   CO2 28 20 - 29 mmol/L   Calcium 9.6 8.7 - 10.3 mg/dL   Total Protein 6.3 6.0 - 8.5 g/dL   Albumin 4.1 3.5 - 4.8 g/dL   Globulin, Total 2.2 1.5 - 4.5 g/dL   Albumin/Globulin Ratio 1.9 1.2 - 2.2   Bilirubin Total 0.8 0.0 - 1.2 mg/dL   Alkaline Phosphatase 52 39 - 117 IU/L   AST 14 0 - 40 IU/L   ALT 9 0 - 32 IU/L  Bayer DCA Hb A1c Waived  Result Value Ref Range   Bayer DCA Hb A1c Waived 6.9 <7.0 %      Assessment & Plan:   Problem List Items Addressed This Visit      Endocrine   Hyperlipidemia associated with type 2 diabetes mellitus ([place])   Relevant Medications   glimepiride (AMARYL) 4 MG tablet   Type 2 diabetes mellitus without complication, without long-term current use of insulin ([facility])     Relevant Medications   glimepiride (AMARYL) 4 MG tablet     Other   Tobacco abuse    Other Visit Diagnoses    Well adult exam    -  Primary   Smokes with greater than 30 pack year history       Relevant Orders   DG Chest 2 View (Completed)       Follow up plan: Return in about 6 months (around 03/06/2018), or if symptoms worsen or fail to improve, for Pelvic and breast exam and recheck diabetes.  Counseling provided for all of the vaccine components Orders Placed This Encounter  Procedures  . DG Chest 2 View    Caryl Pina, MD [facility] 09/05/2017, 3:10 PM

## 2017-09-19 ENCOUNTER — Ambulatory Visit (INDEPENDENT_AMBULATORY_CARE_PROVIDER_SITE_OTHER): Payer: Medicare Other

## 2017-09-19 ENCOUNTER — Ambulatory Visit (INDEPENDENT_AMBULATORY_CARE_PROVIDER_SITE_OTHER): Payer: Medicare Other | Admitting: Family Medicine

## 2017-09-19 ENCOUNTER — Encounter: Payer: Self-pay | Admitting: Family Medicine

## 2017-09-19 VITALS — BP 118/65 | HR 98 | Temp 97.4°F | Ht 61.5 in | Wt 126.0 lb

## 2017-09-19 DIAGNOSIS — R0789 Other chest pain: Secondary | ICD-10-CM

## 2017-09-19 DIAGNOSIS — R0781 Pleurodynia: Secondary | ICD-10-CM | POA: Diagnosis not present

## 2017-09-19 DIAGNOSIS — M25512 Pain in left shoulder: Secondary | ICD-10-CM

## 2017-09-19 DIAGNOSIS — M19012 Primary osteoarthritis, left shoulder: Secondary | ICD-10-CM | POA: Diagnosis not present

## 2017-09-19 MED ORDER — CYCLOBENZAPRINE HCL 5 MG PO TABS: 5.0000 mg | ORAL_TABLET | Freq: Three times a day (TID) | ORAL | 0 refills | Status: DC | PRN

## 2017-09-19 MED ORDER — DICLOFENAC SODIUM 75 MG PO TBEC: 75.0000 mg | DELAYED_RELEASE_TABLET | Freq: Two times a day (BID) | ORAL | 0 refills | Status: DC

## 2017-09-19 NOTE — Progress Notes (Signed)
Subjective:  Patient ID: Hannah French, female    DOB: 08/20/1941  Age: 76 y.o. MRN: [id number]  CC: Flank Pain (pt here today c/o left side pain under her "armpit" that got worse after shoveling snow and hasn't gotten any better)   HPI Hannah French presents for 2-3 weeks of left chest wall pain under the arm. It was mild for a few days prior to shoveling snow two weeks ago. It hurts more when she lifts the arm. NKI - other than potential overuse. No numbness or weakness. Pain  Does not radiate. Stays at the midaxillary line points to about rib 6-8. Describes sensation as a constant dull ache at 5-7/10. No dyspnea. No radiation. Not exertional for aerobic acivity.  Depression screen [facility] 2/9 09/19/2017 09/05/2017 04/04/2017  Decreased Interest 0 0 0  Down, Depressed, Hopeless 0 0 0  PHQ - 2 Score 0 0 0    History Hannah French has a past medical history of Abdominal bruit, Bladder cancer ([place]), Carotid bruit, Diabetes mellitus, Dyslipidemia, Heart murmur, Mitral valve prolapse, Osteoporosis, Pancreatitis, acute, Pseudogout, and PVD (peripheral vascular disease) ([place]).   She has a past surgical history that includes Laparoscopic cholecystectomy (2009); Appendectomy; and Hysterectomy-type unspecified.   Her family history includes Heart attack in her brother and maternal uncle; Heart failure in her mother; Hypertension in her brother and sister.She reports that she has been smoking cigarettes.  She has a 30.00 pack-year smoking history. she has never used smokeless tobacco. She reports that she does not drink alcohol or use drugs.    ROS Review of Systems  Constitutional: Negative for fever.  HENT: Negative for congestion, rhinorrhea and sore throat.   Respiratory: Negative for cough and shortness of breath.   Cardiovascular: Negative for chest pain and palpitations.  Gastrointestinal: Negative for abdominal pain.  Musculoskeletal: Negative for arthralgias and myalgias.    Objective:  BP  118/65   Pulse 98   Temp (!) 97.4 F (36.3 C) (Oral)   Ht 5' 1.5" (1.562 m)   Wt 126 lb (57.2 kg)   SpO2 96%   BMI 23.42 kg/m   BP Readings from Last 3 Encounters:  09/19/17 118/65  09/05/17 136/79  04/04/17 102/61    Wt Readings from Last 3 Encounters:  09/19/17 126 lb (57.2 kg)  09/05/17 126 lb (57.2 kg)  04/04/17 125 lb (56.7 kg)     Physical Exam  Constitutional: She is oriented to person, place, and time. She appears well-developed and well-nourished.  HENT:  Head: Normocephalic and atraumatic.  Cardiovascular: Normal rate and regular rhythm.  No murmur heard. Pulmonary/Chest: Effort normal and breath sounds normal.  Abdominal: Soft. Bowel sounds are normal. She exhibits no mass. There is no tenderness. There is no rebound and no guarding.  Musculoskeletal: She exhibits tenderness (left midaxillary region, ribs 6-8).  Neurological: She is alert and oriented to person, place, and time.  Skin: Skin is warm and dry.  Psychiatric: She has a normal mood and affect. Her behavior is normal.      Assessment & Plan:   Hannah French was seen today for flank pain.  Diagnoses and all orders for this visit:  Acute pain of left shoulder -     DG Shoulder Left; Future -     DG Ribs Unilateral W/Chest Left; Future  Acute chest wall pain  Other orders -     diclofenac (VOLTAREN) 75 MG EC tablet; Take 1 tablet (75 mg total) by mouth 2 (two) times daily. -  cyclobenzaprine (FLEXERIL) 5 MG tablet; Take 1 tablet (5 mg total) by mouth 3 (three) times daily as needed for muscle spasms.       I have discontinued Gaspar Garbe. Tesar's ibuprofen and omeprazole. I am also having her start on diclofenac and cyclobenzaprine. Additionally, I am having her maintain her B Complex Vitamins (VITAMIN B COMPLEX PO), furosemide, Vitamin D (Ergocalciferol), glucose blood, onetouch ultrasoft, metoprolol succinate, Fluticasone-Salmeterol, and glimepiride.  Allergies as of 09/19/2017      Reactions    Bactrim [sulfamethoxazole-trimethoprim] Nausea And Vomiting   Contrast Media [iodinated Diagnostic Agents] Shortness Of Breath   Per pt   Iohexol Other (See Comments)   PASSED OUT DURING THE TEST   Metformin Swelling   Pravastatin Swelling   Myalgia   Rofecoxib Other (See Comments)   UNKNOWN TO PATIENT      Medication List        Accurate as of 09/19/17 11:59 PM. Always use your most recent med list.          cyclobenzaprine 5 MG tablet Commonly known as:  FLEXERIL Take 1 tablet (5 mg total) by mouth 3 (three) times daily as needed for muscle spasms.   diclofenac 75 MG EC tablet Commonly known as:  VOLTAREN Take 1 tablet (75 mg total) by mouth 2 (two) times daily.   Fluticasone-Salmeterol 100-50 MCG/DOSE Aepb Commonly known as:  ADVAIR Inhale 1 puff into the lungs 2 (two) times daily.   furosemide 20 MG tablet Commonly known as:  LASIX Take 1 tablet (20 mg total) by mouth as needed.   glimepiride 4 MG tablet Commonly known as:  AMARYL TAKE (1) TABLET DAILY AS DIRECTED.   glucose blood test strip Check blood glucose twice daily and prn   metoprolol succinate 25 MG 24 hr tablet Commonly known as:  TOPROL-XL Take 1 tablet (25 mg total) by mouth daily.   onetouch ultrasoft lancets Test BS BID and PRN.E11.9   VITAMIN B COMPLEX PO Take by mouth daily.   Vitamin D (Ergocalciferol) 50000 units Caps capsule Commonly known as:  DRISDOL Take 1 capsule (50,000 Units total) by mouth every 7 (seven) days.        Follow-up: Return if symptoms worsen or fail to improve.  Claretta Fraise, M.D.

## 2017-09-25 ENCOUNTER — Encounter: Payer: Self-pay | Admitting: Family Medicine

## 2017-10-08 ENCOUNTER — Encounter: Payer: Self-pay | Admitting: Family Medicine

## 2017-10-08 ENCOUNTER — Ambulatory Visit (INDEPENDENT_AMBULATORY_CARE_PROVIDER_SITE_OTHER): Payer: Medicare Other | Admitting: Family Medicine

## 2017-10-08 VITALS — BP 122/72 | HR 102 | Temp 98.1°F | Ht 61.5 in | Wt 126.0 lb

## 2017-10-08 DIAGNOSIS — J441 Chronic obstructive pulmonary disease with (acute) exacerbation: Secondary | ICD-10-CM | POA: Diagnosis not present

## 2017-10-08 LAB — VERITOR FLU A/B WAIVED
Influenza A: NEGATIVE
Influenza B: NEGATIVE

## 2017-10-08 MED ORDER — ALBUTEROL SULFATE HFA 108 (90 BASE) MCG/ACT IN AERS: 2.0000 | INHALATION_SPRAY | Freq: Four times a day (QID) | RESPIRATORY_TRACT | 0 refills | Status: DC | PRN

## 2017-10-08 MED ORDER — CEFDINIR 300 MG PO CAPS: 300.0000 mg | ORAL_CAPSULE | Freq: Two times a day (BID) | ORAL | 0 refills | Status: DC

## 2017-10-08 MED ORDER — FLUTICASONE PROPIONATE 50 MCG/ACT NA SUSP
1.0000 | Freq: Two times a day (BID) | NASAL | 6 refills | Status: DC | PRN
Start: 2017-10-08 — End: 2018-01-03

## 2017-10-08 NOTE — Progress Notes (Signed)
BP 122/72   Pulse (!) 102   Temp 98.1 F (36.7 C) (Oral)   Ht 5' 1.5" (1.562 m)   Wt 126 lb (57.2 kg)   BMI 23.42 kg/m    Subjective:    Patient ID: Hannah French, female    DOB: Aug 22, 1941, 77 y.o.   MRN: [id number]  HPI: Hannah French is a 77 y.o. female presenting on 10/08/2017 for Nasal and chest congestion, painful cough (x 1 day)   HPI Patient presents to clinic with productive cough x 1 day. She has been coughing up yellowish phlegm since this morning, but denies blood. She is also complaining of congestion, headache above both eyes which she rates a 6/7, sore throat, and clogged ears. She has been having ringing in her left ear x 3 days prior to illness onset. She also had chills yesterday which she attributes to the cold weather. Patient denies fever, shortness of breath, wheezing, and chest pain. She does note that her chest feels "raw" due to cough.   Relevant past medical, surgical, family and social history reviewed and updated as indicated. Interim medical history since our last visit reviewed. Allergies and medications reviewed and updated.  Review of Systems  Constitutional: Positive for chills and fatigue. Negative for diaphoresis and fever.  HENT: Positive for congestion, ear pain, postnasal drip, rhinorrhea, sinus pressure, sore throat and tinnitus (left ear for 2-3 days). Negative for trouble swallowing.   Eyes: Negative for discharge and redness.  Respiratory: Positive for cough. Negative for choking, chest tightness, shortness of breath, wheezing and stridor.   Cardiovascular: Negative for chest pain.    Per HPI unless specifically indicated above        Objective:    BP 122/72   Pulse (!) 102   Temp 98.1 F (36.7 C) (Oral)   Ht 5' 1.5" (1.562 m)   Wt 126 lb (57.2 kg)   BMI 23.42 kg/m   Wt Readings from Last 3 Encounters:  10/08/17 126 lb (57.2 kg)  09/19/17 126 lb (57.2 kg)  09/05/17 126 lb (57.2 kg)    Physical Exam  Constitutional:  She appears well-developed and well-nourished. No distress.  HENT:  Head: Normocephalic.  Right Ear: External ear normal.  Left Ear: External ear normal.  Nose: Nose normal.  Mouth/Throat: Oropharynx is clear and moist.  Oropharyngeal was mildly pink  Eyes: Right eye exhibits no discharge. Left eye exhibits no discharge.  Cardiovascular: Normal rate and regular rhythm.  Murmur heard.  Systolic murmur is present with a grade of 4/6. Pulmonary/Chest: Effort normal and breath sounds normal. No accessory muscle usage. No apnea, no tachypnea and no bradypnea. No respiratory distress. She has no wheezes. She has no rales. She exhibits no tenderness.  Lymphadenopathy:    She has no cervical adenopathy.   Influenza -     Assessment & Plan:   Problem List Items Addressed This Visit    None    Visit Diagnoses    COPD exacerbation ([place])    -  Primary   Relevant Medications   albuterol (PROVENTIL HFA;VENTOLIN HFA) 108 (90 Base) MCG/ACT inhaler   cefdinir (OMNICEF) 300 MG capsule   fluticasone (FLONASE) 50 MCG/ACT nasal spray    Patient presented with classic signs and symptoms of a COPD exacerbation such as productive cough with yellowish phlegm, mildly erythematous throat, and signs of a URI. Patient has a 30 pack year and had no intention of quitting yet.  Follow up plan: Return if symptoms  worsen or fail to improve. Patient is being prescribed Cefdfinir 300 mg capsules BID x 10 days for COPD exacerbation. Patient is also being prescribed albuterol 108 MCG/ACT to help with breathing and Flonase 50 MCG/ACT nasal spray for congestion. Patient has been educated on the effects smoking has on her body and overall health, but she is not ready to quit yet. Patient has been advised to call the office or report to the ER if she has trouble breathing. COPD diagnosis will be discussed with patient of next appointment.  Counseling provided for all of the vaccine components Orders Placed This Encounter   Procedures  . Veritor Flu A/B Waived   She was seen and examined with Engineer, building services.  Agree with assessment and plan above. Caryl Pina, MD [facility] 10/11/2017, 9:29 PM

## 2017-10-11 ENCOUNTER — Telehealth: Payer: Self-pay | Admitting: Family Medicine

## 2017-10-11 NOTE — Telephone Encounter (Signed)
PT was seen on 1/14 and she thinks that the cefdinir (OMNICEF) 300 MG capsule is causing her to have diarrhea she has been having this since Wednesday and would like to have something else called into [facility]. She wants to talk to Jan before the provider changes the medication

## 2017-10-12 ENCOUNTER — Other Ambulatory Visit: Payer: Self-pay | Admitting: *Deleted

## 2017-10-12 ENCOUNTER — Telehealth: Payer: Self-pay | Admitting: Family Medicine

## 2017-10-12 MED ORDER — LEVOFLOXACIN 500 MG PO TABS: 500.0000 mg | ORAL_TABLET | Freq: Every day | ORAL | 0 refills | Status: DC

## 2017-10-12 NOTE — Telephone Encounter (Signed)
Per pt Omnicef caused severe diarrhea Pt has dced antibiotic Wants antibiotic changed to something else Pt has taken Levaquin with no problem Please review and advise

## 2017-10-12 NOTE — Telephone Encounter (Signed)
Patient is having episodes of severe diarrhea with the [place].  She has been taking it on a full stomach but it has not helped.  She would like for you to call in another antibiotic.  She said she has taken Levaquin with no problem.  Cannot take Sulfa.  [place], please advise.

## 2017-10-12 NOTE — Telephone Encounter (Signed)
Levaquin was sent in, patient aware

## 2017-10-12 NOTE — Telephone Encounter (Signed)
Aware.  Script sent to [name].

## 2017-10-12 NOTE — Telephone Encounter (Signed)
Please send her prescription for Levaquin 500 daily for 7 days

## 2017-10-12 NOTE — Telephone Encounter (Signed)
I believe already answered this and send it to [facility],

## 2017-10-26 ENCOUNTER — Encounter: Payer: Self-pay | Admitting: Family Medicine

## 2017-10-26 ENCOUNTER — Ambulatory Visit (INDEPENDENT_AMBULATORY_CARE_PROVIDER_SITE_OTHER): Payer: Medicare Other | Admitting: Family Medicine

## 2017-10-26 VITALS — BP 115/66 | HR 88 | Temp 97.1°F | Ht 61.5 in | Wt 126.0 lb

## 2017-10-26 DIAGNOSIS — J449 Chronic obstructive pulmonary disease, unspecified: Secondary | ICD-10-CM | POA: Insufficient documentation

## 2017-10-26 DIAGNOSIS — N898 Other specified noninflammatory disorders of vagina: Secondary | ICD-10-CM

## 2017-10-26 DIAGNOSIS — J439 Emphysema, unspecified: Secondary | ICD-10-CM | POA: Diagnosis not present

## 2017-10-26 DIAGNOSIS — J441 Chronic obstructive pulmonary disease with (acute) exacerbation: Secondary | ICD-10-CM

## 2017-10-26 MED ORDER — UMECLIDINIUM BROMIDE 62.5 MCG/INH IN AEPB
1.0000 | INHALATION_SPRAY | Freq: Every day | RESPIRATORY_TRACT | 2 refills | Status: DC
Start: 2017-10-26 — End: 2018-05-31

## 2017-10-26 MED ORDER — LEVOFLOXACIN 500 MG PO TABS: 500.0000 mg | ORAL_TABLET | Freq: Every day | ORAL | 0 refills | Status: DC

## 2017-10-26 MED ORDER — FLUCONAZOLE 150 MG PO TABS: 150.0000 mg | ORAL_TABLET | ORAL | 0 refills | Status: DC

## 2017-10-26 NOTE — Progress Notes (Signed)
BP 115/66   Pulse 88   Temp (!) 97.1 F (36.2 C) (Oral)   Ht 5' 1.5" (1.562 m)   Wt 126 lb (57.2 kg)   SpO2 96%   BMI 23.42 kg/m    Subjective:    Patient ID: Hannah French, female    DOB: Feb 15, 1941, 77 y.o.   MRN: [id number]  HPI: NEHA WAIGHT is a 77 y.o. female presenting on 10/26/2017 for Chest congestion and cough and Vaginal itching since being on antibiotic   HPI COPD with mild exacerbation Patient comes in complaining of cough and congestion and wheezing that is improved from where she was when she was ill a couple weeks ago but she is still having a little bit and is not completely over it and she is going on a trip down to [place] and wanted to get treated for it just in case it possibly got worse.  She does admit that she still is using her Advair regularly and also admits that she still using her cigarettes regularly.  Patient has baseline COPD but does not like steroids as she reacts to them.  She says she has a mild wheeze and cough and congestion and upper respiratory symptoms including sinus pressure.  She denies any fevers or chills or significant short of breath more than her baseline currently.  Vaginal discharge and irritation Ever since patient was treated with an antibiotic couple weeks ago she has been having vaginal discharge and irritation and wants to see about treatment for this.  She says the discharge has been thicker but she has not had a lot of discharge but it just more of the itchiness and irritation which is common to when she is gotten yeast infections before.  She denies any vaginal bleeding or abdominal pain or pelvic pain.  Relevant past medical, surgical, family and social history reviewed and updated as indicated. Interim medical history since our last visit reviewed. Allergies and medications reviewed and updated.  Review of Systems  Constitutional: Negative for chills and fever.  HENT: Positive for congestion, postnasal drip, rhinorrhea,  sinus pressure, sneezing and sore throat. Negative for ear discharge and ear pain.   Eyes: Negative for pain, redness and visual disturbance.  Respiratory: Positive for cough and wheezing. Negative for chest tightness and shortness of breath.   Cardiovascular: Negative for chest pain and leg swelling.  Gastrointestinal: Negative for abdominal pain.  Genitourinary: Positive for vaginal discharge. Negative for pelvic pain, vaginal bleeding and vaginal pain.  Musculoskeletal: Negative for back pain and gait problem.  Skin: Negative for rash.  Neurological: Negative for light-headedness and headaches.  Psychiatric/Behavioral: Negative for agitation and behavioral problems.  All other systems reviewed and are negative.   Per HPI unless specifically indicated above   Allergies as of 10/26/2017      Reactions   Bactrim [sulfamethoxazole-trimethoprim] Nausea And Vomiting   Contrast Media [iodinated Diagnostic Agents] Shortness Of Breath   Per pt   Iohexol Other (See Comments)   PASSED OUT DURING THE TEST   Metformin Swelling   Pravastatin Swelling   Myalgia   Omnicef [cefdinir]    Rofecoxib Other (See Comments)   UNKNOWN TO PATIENT      Medication List        Accurate as of 10/26/17 12:00 PM. Always use your most recent med list.          albuterol 108 (90 Base) MCG/ACT inhaler Commonly known as:  PROVENTIL HFA;VENTOLIN HFA Inhale 2 puffs into  the lungs every 6 (six) hours as needed for wheezing or shortness of breath.   cyclobenzaprine 5 MG tablet Commonly known as:  FLEXERIL Take 1 tablet (5 mg total) by mouth 3 (three) times daily as needed for muscle spasms.   fluconazole 150 MG tablet Commonly known as:  DIFLUCAN Take 1 tablet (150 mg total) by mouth once a week.   fluticasone 50 MCG/ACT nasal spray Commonly known as:  FLONASE Place 1 spray into both nostrils 2 (two) times daily as needed for allergies or rhinitis.   Fluticasone-Salmeterol 100-50 MCG/DOSE  Aepb Commonly known as:  ADVAIR Inhale 1 puff into the lungs 2 (two) times daily.   furosemide 20 MG tablet Commonly known as:  LASIX Take 1 tablet (20 mg total) by mouth as needed.   glimepiride 4 MG tablet Commonly known as:  AMARYL TAKE (1) TABLET DAILY AS DIRECTED.   glucose blood test strip Check blood glucose twice daily and prn   levofloxacin 500 MG tablet Commonly known as:  LEVAQUIN Take 1 tablet (500 mg total) by mouth daily.   metoprolol succinate 25 MG 24 hr tablet Commonly known as:  TOPROL-XL Take 1 tablet (25 mg total) by mouth daily.   onetouch ultrasoft lancets Test BS BID and PRN.E11.9   umeclidinium bromide 62.5 MCG/INH Aepb Commonly known as:  INCRUSE ELLIPTA Inhale 1 puff into the lungs daily.   VITAMIN B COMPLEX PO Take by mouth daily.   Vitamin D (Ergocalciferol) 50000 units Caps capsule Commonly known as:  DRISDOL Take 1 capsule (50,000 Units total) by mouth every 7 (seven) days.          Objective:    BP 115/66   Pulse 88   Temp (!) 97.1 F (36.2 C) (Oral)   Ht 5' 1.5" (1.562 m)   Wt 126 lb (57.2 kg)   SpO2 96%   BMI 23.42 kg/m   Wt Readings from Last 3 Encounters:  10/26/17 126 lb (57.2 kg)  10/08/17 126 lb (57.2 kg)  09/19/17 126 lb (57.2 kg)    Physical Exam  Constitutional: She is oriented to person, place, and time. She appears well-developed and well-nourished. No distress.  HENT:  Right Ear: Tympanic membrane, external ear and ear canal normal.  Left Ear: Tympanic membrane, external ear and ear canal normal.  Nose: Mucosal edema and rhinorrhea present. No epistaxis. Right sinus exhibits no maxillary sinus tenderness and no frontal sinus tenderness. Left sinus exhibits no maxillary sinus tenderness and no frontal sinus tenderness.  Mouth/Throat: Uvula is midline and mucous membranes are normal. Posterior oropharyngeal edema and posterior oropharyngeal erythema present. No oropharyngeal exudate or tonsillar abscesses.   Eyes: Conjunctivae are normal.  Neck: Neck supple. No thyromegaly present.  Cardiovascular: Normal rate, regular rhythm, normal heart sounds and intact distal pulses.  No murmur heard. Pulmonary/Chest: Effort normal. No tachypnea. No respiratory distress. She has no decreased breath sounds. She has wheezes. She has rhonchi. She has no rales.  Musculoskeletal: Normal range of motion. She exhibits no edema or tenderness.  Lymphadenopathy:    She has no cervical adenopathy.  Neurological: She is alert and oriented to person, place, and time. Coordination normal.  Skin: Skin is warm and dry. No rash noted. She is not diaphoretic.  Psychiatric: She has a normal mood and affect. Her behavior is normal.  Vitals reviewed.       Assessment & Plan:   Problem List Items Addressed This Visit      Respiratory   COPD (  chronic obstructive pulmonary disease) (HCC)   Relevant Medications   umeclidinium bromide (INCRUSE ELLIPTA) 62.5 MCG/INH AEPB    Other Visit Diagnoses    COPD exacerbation ([place])    -  Primary   Relevant Medications   levofloxacin (LEVAQUIN) 500 MG tablet   umeclidinium bromide (INCRUSE ELLIPTA) 62.5 MCG/INH AEPB   Vaginal discharge       Relevant Medications   fluconazole (DIFLUCAN) 150 MG tablet       Follow up plan: Return in about 2 months (around 12/24/2017), or if symptoms worsen or fail to improve, for Diabetes and COPD recheck.  Counseling provided for all of the vaccine components No orders of the defined types were placed in this encounter.   Caryl Pina, MD [facility] 10/26/2017, 12:00 PM

## 2017-12-25 DIAGNOSIS — M25532 Pain in left wrist: Secondary | ICD-10-CM | POA: Diagnosis not present

## 2017-12-25 DIAGNOSIS — M1712 Unilateral primary osteoarthritis, left knee: Secondary | ICD-10-CM | POA: Diagnosis not present

## 2017-12-25 DIAGNOSIS — M1711 Unilateral primary osteoarthritis, right knee: Secondary | ICD-10-CM | POA: Diagnosis not present

## 2018-01-03 ENCOUNTER — Encounter: Payer: Self-pay | Admitting: Family Medicine

## 2018-01-03 ENCOUNTER — Ambulatory Visit (INDEPENDENT_AMBULATORY_CARE_PROVIDER_SITE_OTHER): Payer: Medicare Other | Admitting: Family Medicine

## 2018-01-03 VITALS — BP 134/67 | HR 73 | Temp 97.8°F | Ht 61.5 in | Wt 123.0 lb

## 2018-01-03 DIAGNOSIS — S39012A Strain of muscle, fascia and tendon of lower back, initial encounter: Secondary | ICD-10-CM | POA: Diagnosis not present

## 2018-01-03 DIAGNOSIS — H6123 Impacted cerumen, bilateral: Secondary | ICD-10-CM

## 2018-01-03 MED ORDER — CYCLOBENZAPRINE HCL 5 MG PO TABS: 5.0000 mg | ORAL_TABLET | Freq: Three times a day (TID) | ORAL | 3 refills | Status: DC | PRN

## 2018-01-03 NOTE — Progress Notes (Signed)
BP 134/67   Pulse 73   Temp 97.8 F (36.6 C) (Oral)   Ht 5' 1.5" (1.562 m)   Wt 123 lb (55.8 kg)   BMI 22.86 kg/m    Subjective:    Patient ID: Hannah French, female    DOB: 1941-06-14, 77 y.o.   MRN: [id number]  HPI: Hannah French is a 77 y.o. female presenting on 01/03/2018 for Back Pain (been working in yard, picking up bags of mulch)   HPI Low back pain Patient comes in complaining of low back pain and muffled ears and wax mold title she is been having wax in both of her ears wants to get them cleaned out and has been bothering over the past month.  The low back has been hurting her since yesterday.  She was doing some yard work and somehow she started to fall and twisted funny but did not actually fall all the way to the ground but since then she has been having bilateral lower back pain that hurts more when she is getting up from a seated position or going up stairs.  It also hurts more when she bends over and then tries to stand back up.  She denies any radiation of the pain or numbness or weakness going down either leg.  She has been trying to use mineral ice and ibuprofen 400 mg which has been helping some.  Relevant past medical, surgical, family and social history reviewed and updated as indicated. Interim medical history since our last visit reviewed. Allergies and medications reviewed and updated.  Review of Systems  Constitutional: Negative for chills and fever.  HENT: Positive for hearing loss. Negative for congestion, ear discharge and ear pain.   Eyes: Negative for redness and visual disturbance.  Respiratory: Negative for chest tightness and shortness of breath.   Cardiovascular: Negative for chest pain and leg swelling.  Genitourinary: Negative for dysuria, flank pain, hematuria and urgency.  Musculoskeletal: Positive for back pain. Negative for gait problem.  Skin: Negative for rash.  Neurological: Negative for light-headedness and headaches.    Psychiatric/Behavioral: Negative for agitation and behavioral problems.  All other systems reviewed and are negative.   Per HPI unless specifically indicated above   Allergies as of 01/03/2018      Reactions   Bactrim [sulfamethoxazole-trimethoprim] Nausea And Vomiting   Contrast Media [iodinated Diagnostic Agents] Shortness Of Breath   Per pt   Iohexol Other (See Comments)   PASSED OUT DURING THE TEST   Metformin Swelling   Pravastatin Swelling   Myalgia   Omnicef [cefdinir]    Rofecoxib Other (See Comments)   UNKNOWN TO PATIENT      Medication List        Accurate as of 01/03/18  4:13 PM. Always use your most recent med list.          albuterol 108 (90 Base) MCG/ACT inhaler Commonly known as:  PROVENTIL HFA;VENTOLIN HFA Inhale 2 puffs into the lungs every 6 (six) hours as needed for wheezing or shortness of breath.   CALCIUM 1200+D3 PO Take by mouth.   cholecalciferol 1000 units tablet Commonly known as:  VITAMIN D Take 1,000 Units by mouth daily.   cyclobenzaprine 5 MG tablet Commonly known as:  FLEXERIL Take 1 tablet (5 mg total) by mouth 3 (three) times daily as needed for muscle spasms.   fluconazole 150 MG tablet Commonly known as:  DIFLUCAN Take 1 tablet (150 mg total) by mouth once a week.  Fluticasone-Salmeterol 100-50 MCG/DOSE Aepb Commonly known as:  ADVAIR Inhale 1 puff into the lungs 2 (two) times daily.   furosemide 20 MG tablet Commonly known as:  LASIX Take 1 tablet (20 mg total) by mouth as needed.   glimepiride 4 MG tablet Commonly known as:  AMARYL TAKE (1) TABLET DAILY AS DIRECTED.   glucose blood test strip Check blood glucose twice daily and prn   metoprolol succinate 25 MG 24 hr tablet Commonly known as:  TOPROL-XL Take 1 tablet (25 mg total) by mouth daily.   onetouch ultrasoft lancets Test BS BID and PRN.E11.9   umeclidinium bromide 62.5 MCG/INH Aepb Commonly known as:  INCRUSE ELLIPTA Inhale 1 puff into the lungs  daily.   VITAMIN B COMPLEX PO Take by mouth daily.          Objective:    BP 134/67   Pulse 73   Temp 97.8 F (36.6 C) (Oral)   Ht 5' 1.5" (1.562 m)   Wt 123 lb (55.8 kg)   BMI 22.86 kg/m   Wt Readings from Last 3 Encounters:  01/03/18 123 lb (55.8 kg)  10/26/17 126 lb (57.2 kg)  10/08/17 126 lb (57.2 kg)    Physical Exam  Constitutional: She is oriented to person, place, and time. She appears well-developed and well-nourished. No distress.  Eyes: Pupils are equal, round, and reactive to light. Conjunctivae and EOM are normal.  Cardiovascular: Normal rate, regular rhythm, normal heart sounds and intact distal pulses.  No murmur heard. Pulmonary/Chest: Effort normal and breath sounds normal. No respiratory distress. She has no wheezes.  Abdominal: There is no tenderness.  Musculoskeletal: Normal range of motion. She exhibits tenderness (Bilateral lumbar tenderness, worse when sitting position and getting up from bending over). She exhibits no edema.  Neurological: She is alert and oriented to person, place, and time. Coordination normal.  Skin: Skin is warm and dry. No rash noted. She is not diaphoretic.  Psychiatric: She has a normal mood and affect. Her behavior is normal.  Nursing note and vitals reviewed.       Assessment & Plan:   Problem List Items Addressed This Visit    None    Visit Diagnoses    Back strain, initial encounter    -  Primary   Relevant Medications   cyclobenzaprine (FLEXERIL) 5 MG tablet   Bilateral impacted cerumen       Nurse to lavage, patient tolerated well.       Follow up plan: Return in about 1 month (around 01/31/2018), or if symptoms worsen or fail to improve, for Diabetes and COPD recheck.  Counseling provided for all of the vaccine components No orders of the defined types were placed in this encounter.   Caryl Pina, MD [facility] 01/03/2018, 4:13 PM

## 2018-01-04 ENCOUNTER — Other Ambulatory Visit: Payer: Self-pay | Admitting: Nurse Practitioner

## 2018-01-17 ENCOUNTER — Ambulatory Visit (INDEPENDENT_AMBULATORY_CARE_PROVIDER_SITE_OTHER): Payer: Medicare Other

## 2018-01-17 ENCOUNTER — Encounter: Payer: Self-pay | Admitting: Family Medicine

## 2018-01-17 ENCOUNTER — Ambulatory Visit (INDEPENDENT_AMBULATORY_CARE_PROVIDER_SITE_OTHER): Payer: Medicare Other | Admitting: Family Medicine

## 2018-01-17 VITALS — BP 121/67 | HR 93 | Temp 97.2°F | Ht 61.5 in | Wt 120.6 lb

## 2018-01-17 DIAGNOSIS — K5901 Slow transit constipation: Secondary | ICD-10-CM | POA: Diagnosis not present

## 2018-01-17 DIAGNOSIS — M545 Low back pain, unspecified: Secondary | ICD-10-CM

## 2018-01-17 DIAGNOSIS — S3992XA Unspecified injury of lower back, initial encounter: Secondary | ICD-10-CM | POA: Diagnosis not present

## 2018-01-17 DIAGNOSIS — W19XXXD Unspecified fall, subsequent encounter: Secondary | ICD-10-CM

## 2018-01-17 NOTE — Patient Instructions (Signed)
Your back x-ray did not reveal any acute fractures or obvious evidence of severe degenerative disc disease.  However, there is a good amount of stool noted within the colon.  Below is my recommendation for MiraLAX cleanout.  This will likely improve your back pain.  Thank you for coming in to clinic today.  1. Your symptoms are consistent with Constipation, likely cause of your General Abdominal Pain / Cramping. 2. Start with Miralax sent to pharmacy. First dose 68g (4 capfuls) in 32oz water over 1 to 2 hours for clean out. Next day start 17g or 1 capful daily, may adjust dose up or down by half a capful every few days. Recommend to take this medicine daily for next 1-2 weeks, then may need to use it longer if needed. - Goal is to have soft regular bowel movement 1-3x daily, if too runny or diarrhea, then reduce dose of the medicine  Improve water intake, hydration will help Also recommend increased vegetables, fruits, fiber intake Can try daily Metamucil or Fiber supplement at pharmacy over the counter  Follow-up if symptoms are not improving with bowel movements, or if pain worsens, develop fevers, nausea, vomiting.  Please schedule a follow-up appointment with Dr Dettinger in 1 month to follow-up Constipation  If you have any other questions or concerns, please feel free to call the clinic to contact me. You may also schedule an earlier appointment if necessary.  However, if your symptoms get significantly worse, please go to the Emergency Department to seek immediate medical attention.

## 2018-01-17 NOTE — Progress Notes (Signed)
Subjective: CC: low back pain PCP: Dettinger, Fransisca Kaufmann, MD Hannah French is a 77 y.o. female presenting to clinic today for:  1. Low back pain/constipation Patient was seen on 01/03/2018 for 1 day history of low back pain that she sustained after a mechanical fall in her yard.  She did not actually fall on her bottom or hit anything it was more of a stumble.  She notes that she was prescribed Flexeril, which does seem to help with the pain but she feels that pain is lasting longer than any of her low back spasms have in the past.  She is also taking ibuprofen 400 mg p.o. 3 times daily.  She notes pain particularly with turning over in bed and getting up from a sitting position.  Denies any new onset lower extremity numbness or tingling, saddle anesthesia, fecal incontinence or urinary retention.  Past medical history significant for fracture of her coccyx several decades ago.  Surgical history negative for spinal surgery.  In fact, she notes significant constipation over the last week.  She has been using Senokot and rectal suppositories in efforts to relieve this.  Last bowel movement was small yesterday.  No hematochezia, melena.  She intermittently has nausea with vomiting.  This is a chronic issue for her and not necessarily associated with constipation.  She reports good fluid intake.   ROS: Per HPI  Allergies  Allergen Reactions  . Bactrim [Sulfamethoxazole-Trimethoprim] Nausea And Vomiting  . Contrast Media [Iodinated Diagnostic Agents] Shortness Of Breath    Per pt  . Iohexol Other (See Comments)    PASSED OUT DURING THE TEST  . Metformin Swelling  . Pravastatin Swelling    Myalgia   . Omnicef [Cefdinir]   . Rofecoxib Other (See Comments)    UNKNOWN TO PATIENT   Past Medical History:  Diagnosis Date  . Abdominal bruit   . Bladder cancer ([place])   . Carotid bruit   . Diabetes mellitus    Type II  . Dyslipidemia   . Heart murmur   . Mitral valve prolapse   .  Osteoporosis   . Pancreatitis, acute   . Pseudogout   . PVD (peripheral vascular disease) (HCC)     Current Outpatient Medications:  .  albuterol (PROVENTIL HFA;VENTOLIN HFA) 108 (90 Base) MCG/ACT inhaler, Inhale 2 puffs into the lungs every 6 (six) hours as needed for wheezing or shortness of breath., Disp: 1 Inhaler, Rfl: 0 .  B Complex Vitamins (VITAMIN B COMPLEX PO), Take by mouth daily., Disp: , Rfl:  .  Calcium-Magnesium-Vitamin D (CALCIUM 1200+D3 PO), Take by mouth., Disp: , Rfl:  .  cholecalciferol (VITAMIN D) 1000 units tablet, Take 1,000 Units by mouth daily., Disp: , Rfl:  .  cyclobenzaprine (FLEXERIL) 5 MG tablet, Take 1 tablet (5 mg total) by mouth 3 (three) times daily as needed for muscle spasms., Disp: 30 tablet, Rfl: 3 .  Fluticasone-Salmeterol (ADVAIR) 100-50 MCG/DOSE AEPB, Inhale 1 puff into the lungs 2 (two) times daily., Disp: 1 each, Rfl: 3 .  furosemide (LASIX) 20 MG tablet, Take 1 tablet (20 mg total) by mouth as needed., Disp: 30 tablet, Rfl: 2 .  glimepiride (AMARYL) 4 MG tablet, TAKE (1) TABLET DAILY AS DIRECTED., Disp: 90 tablet, Rfl: 1 .  metoprolol succinate (TOPROL-XL) 25 MG 24 hr tablet, Take 1 tablet (25 mg total) by mouth daily., Disp: 90 tablet, Rfl: 2 .  ONETOUCH DELICA LANCETS 80D MISC, USE   TO CHECK GLUCOSE TWICE DAILY  AND AS NEEDED, Disp: 100 each, Rfl: 2 .  ONETOUCH VERIO test strip, USE  STRIP TO CHECK GLUCOSE TWICE DAILY AND AS NEEDED, Disp: 100 each, Rfl: 2 .  umeclidinium bromide (INCRUSE ELLIPTA) 62.5 MCG/INH AEPB, Inhale 1 puff into the lungs daily., Disp: 30 each, Rfl: 2 Social History   Socioeconomic History  . Marital status: Divorced    Spouse name: Not on file  . Number of children: 2  . Years of education: Not on file  . Highest education level: Not on file  Occupational History  . Occupation: Retired  Scientific laboratory technician  . Financial resource strain: Not on file  . Food insecurity:    Worry: Not on file    Inability: Not on file  .  Transportation needs:    Medical: Not on file    Non-medical: Not on file  Tobacco Use  . Smoking status: Current Every Day Smoker    Packs/day: 1.00    Years: 30.00    Pack years: 30.00    Types: Cigarettes  . Smokeless tobacco: Never Used  Substance and Sexual Activity  . Alcohol use: No  . Drug use: No  . Sexual activity: Not on file  Lifestyle  . Physical activity:    Days per week: Not on file    Minutes per session: Not on file  . Stress: Not on file  Relationships  . Social connections:    Talks on phone: Not on file    Gets together: Not on file    Attends religious service: Not on file    Active member of club or organization: Not on file    Attends meetings of clubs or organizations: Not on file    Relationship status: Not on file  . Intimate partner violence:    Fear of current or ex partner: Not on file    Emotionally abused: Not on file    Physically abused: Not on file    Forced sexual activity: Not on file  Other Topics Concern  . Not on file  Social History Narrative  . Not on file   Family History  Problem Relation Age of Onset  . Heart failure Mother   . Heart attack Brother   . Heart attack Maternal Uncle   . Hypertension Sister   . Hypertension Brother   . Coronary artery disease Neg Hx        Early    Objective: Office vital signs reviewed. BP 121/67   Pulse 93   Temp (!) 97.2 F (36.2 C) (Oral)   Ht 5' 1.5" (1.562 m)   Wt 120 lb 9.6 oz (54.7 kg)   BMI 22.42 kg/m   Physical Examination:  General: Awake, alert, No acute distress Throat: mucus membranes slightly dry Pulm:  normal work of breathing on room air Extremities: warm, well perfused, No edema, cyanosis or clubbing; +2 pulses bilaterally MSK: slow gait  Thoracic spine: Slight increase in the kyphotic curve noted.  Lumbar spine: Discomfort noted with rising from a seated position.  She has limited range of motion in flexion and rotation secondary to pain.  Slight scoliotic  curve noted.  No midline tenderness to palpation.  She has slight paraspinal muscle tenderness to palpation, particularly over the lumbosacral junction.  No palpable bony abnormalities. Neuro: Light touch sensation grossly intact.  Assessment/ Plan: 77 y.o. female   1. Acute bilateral low back pain without sciatica X-rays were obtained per patient's request.  Upon my personal review, no acute  bony abnormalities to explain low back pain noted.  Disc spaces seem fairly preserved with the exception of L3-L4, this is slightly narrowed.  There are small degenerative changes noted throughout the lumbar spine.  Back pain most certainly may be related to persistent muscle spasm.  However, I suspect that constipation is having a great deal of impact on her lumbosacral discomfort.  During our discussion of this, she does note that having a bowel movement seems to relieve the low back pain.  I have given her instructions for MiraLAX cleanout as below.  She declined a written prescription as she plans obtain this over-the-counter.  Instructions were reviewed.  Patient voiced good understanding.  Reasons for return discussed. - DG Lumbar Spine 2-3 Views; Future  2. Fall, subsequent encounter - DG Lumbar Spine 2-3 Views; Future  3. Slow transit constipation Miralax as above.    Orders Placed This Encounter  Procedures  . DG Lumbar Spine 2-3 Views    Standing Status:   Future    Number of Occurrences:   1    Standing Expiration Date:   03/19/2019    Order Specific Question:   Reason for Exam (SYMPTOM  OR DIAGNOSIS REQUIRED)    Answer:   back pain    Order Specific Question:   Preferred imaging location?    Answer:   Internal      Janora Norlander, [place] [phone]

## 2018-02-21 ENCOUNTER — Encounter: Payer: Self-pay | Admitting: Cardiovascular Disease

## 2018-02-21 ENCOUNTER — Ambulatory Visit: Payer: Medicare Other | Admitting: Cardiovascular Disease

## 2018-02-21 VITALS — BP 130/88 | HR 61 | Ht 61.0 in | Wt 119.2 lb

## 2018-02-21 DIAGNOSIS — I6523 Occlusion and stenosis of bilateral carotid arteries: Secondary | ICD-10-CM | POA: Diagnosis not present

## 2018-02-21 DIAGNOSIS — I739 Peripheral vascular disease, unspecified: Secondary | ICD-10-CM | POA: Diagnosis not present

## 2018-02-21 MED ORDER — METOPROLOL SUCCINATE ER 25 MG PO TB24: 25.0000 mg | ORAL_TABLET | Freq: Every day | ORAL | 3 refills | Status: DC

## 2018-02-21 NOTE — Progress Notes (Signed)
Cardiology Office Note Date:  02/21/2018   ID:  Hannah French, DOB Sep 12, 1941, MRN [id number]  PCP:  Dettinger, Fransisca Kaufmann, MD  Cardiologist:  Sherren Mocha, MD    Chief Complaint  Patient presents with  . Follow-up    PAD     History of Present Illness: Hannah French is a 77 y.o. female who presents for follow-up of peripheral arterial disease.  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  The patient is a long-standing smoker with type 2 diabetes.  She developed skin lesions last year raising concern for arterial ulceration. She underwent peripheral vascular consultation and her skin rash was ultimately felt not to be related.  Her most recent carotid duplex from November 2018 demonstrated patent carotid arteries bilaterally and findings were also consistent with bilateral subclavian artery stenosis.Vascular studies from 12/2016 showed stable, severe bilateral iliac artery stenosis and normal ABI's of 1.1 bilaterally.   The patient is here alone today.  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  She has taken prednisone and has been under the care of rheumatology.  Has had some improvement in her skin lesions but still has areas of scarring.  States that her pain is improved.  Also states that she feels she is lost a lot of muscle mass.  She has some sharp pains in the left breast area, unrelated to physical activity.  States the pains feel similar to the pains associated with her vasculitis.  She denies calf claudication symptoms and maintains an active lifestyle.  She continues to smoke cigarettes.  Past Medical History:  Diagnosis Date  . Abdominal bruit   . Bladder cancer ([place])   . Carotid bruit   . Diabetes mellitus    Type II  . Dyslipidemia   . Heart murmur   . Mitral valve prolapse   . Osteoporosis   . Pancreatitis, acute   . Pseudogout   . PVD (peripheral vascular disease) ([place])     Past  Surgical History:  Procedure Laterality Date  . APPENDECTOMY    . Hysterectomy-type unspecified    . LAPAROSCOPIC CHOLECYSTECTOMY  2009    Current Outpatient Medications  Medication Sig Dispense Refill  . albuterol (PROVENTIL HFA;VENTOLIN HFA) 108 (90 Base) MCG/ACT inhaler Inhale 2 puffs into the lungs every 6 (six) hours as needed for wheezing or shortness of breath. 1 Inhaler 0  . B Complex Vitamins (VITAMIN B COMPLEX PO) Take by mouth daily.    . Calcium-Magnesium-Vitamin D (CALCIUM 1200+D3 PO) Take by mouth.    . cholecalciferol (VITAMIN D) 1000 units tablet Take 1,000 Units by mouth daily.    . cyclobenzaprine (FLEXERIL) 5 MG tablet Take 1 tablet (5 mg total) by mouth 3 (three) times daily as needed for muscle spasms. 30 tablet 3  . Fluticasone-Salmeterol (ADVAIR) 100-50 MCG/DOSE AEPB Inhale 1 puff into the lungs 2 (two) times daily. 1 each 3  . furosemide (LASIX) 20 MG tablet Take 1 tablet (20 mg total) by mouth as needed. 30 tablet 2  . glimepiride (AMARYL) 4 MG tablet TAKE (1) TABLET DAILY AS DIRECTED. 90 tablet 1  . metoprolol succinate (TOPROL-XL) 25 MG 24 hr tablet Take 1 tablet (25 mg total) by mouth daily. 90 tablet 3  . ONETOUCH DELICA LANCETS 83T MISC USE   TO CHECK GLUCOSE TWICE DAILY AND AS NEEDED 100 each 2  . ONETOUCH VERIO test strip USE  STRIP TO CHECK GLUCOSE TWICE  DAILY AND AS NEEDED 100 each 2  . umeclidinium bromide (INCRUSE ELLIPTA) 62.5 MCG/INH AEPB Inhale 1 puff into the lungs daily. 30 each 2   No current facility-administered medications for this visit.     Allergies:   Bactrim [sulfamethoxazole-trimethoprim]; Contrast media [iodinated diagnostic agents]; Iohexol; Metformin; Pravastatin; Omnicef [cefdinir]; and Rofecoxib   Social History:  The patient  reports that she has been smoking cigarettes.  She has a 30.00 pack-year smoking history. She has never used smokeless tobacco. She reports that she does not drink alcohol or use drugs.   Family History:  The  patient's family history includes Heart attack in her brother and maternal uncle; Heart failure in her mother; Hypertension in her brother and sister.    ROS:  Please see the history of present illness.  Otherwise, review of systems is positive for excessive sweating, irregular heart beats, and wheezing.  All other systems are reviewed and negative.    PHYSICAL EXAM: VS:  BP 130/88   Pulse 61   Ht 5\' 1"  (1.549 m)   Wt 119 lb 3.2 oz (54.1 kg)   SpO2 91%   BMI 22.52 kg/m  , BMI Body mass index is 22.52 kg/m. GEN: Well nourished, well developed, in no acute distress  HEENT: normal  Neck: no JVD, no masses.  Bilateral carotid bruits Cardiac: RRR with 2/6 harsh systolic murmur at the right upper sternal border and right subclavian area            Respiratory:  clear to auscultation bilaterally, normal work of breathing GI: soft, nontender, nondistended, + BS MS: no deformity or atrophy  Ext: no pretibial edema, femoral pulses are 2+ bilaterally Skin: warm and dry, lesions on both legs noted Neuro:  Strength and sensation are intact Psych: euthymic mood, full affect  EKG:  EKG is ordered today. The ekg ordered today shows NSR 94 bpm, age-indeterminate septal infarct, no ST-T changes  Recent Labs: 08/30/2017: ALT 9; BUN 16; Creatinine, Ser 0.85; Hemoglobin 12.9; Platelets 162; Potassium 4.2; Sodium 141   Lipid Panel     Component Value Date/Time   CHOL 169 08/30/2017 0818   TRIG 82 08/30/2017 0818   TRIG 82 06/09/2014 1028   HDL 50 08/30/2017 0818   HDL 57 06/09/2014 1028   CHOLHDL 3.4 08/30/2017 0818   CHOLHDL 3.5 02/06/2013 0932   VLDL 20 02/06/2013 0932   LDLCALC 103 (H) 08/30/2017 0818   LDLCALC 131 (H) 06/09/2014 1028      Wt Readings from Last 3 Encounters:  02/21/18 119 lb 3.2 oz (54.1 kg)  01/17/18 120 lb 9.6 oz (54.7 kg)  01/03/18 123 lb (55.8 kg)     Cardiac Studies Reviewed: Carotid Ultrasound 08/01/2017: Final Interpretation: Right Carotid: There is  evidence in the right ICA of a 1-39% stenosis.        Non-hemodynamically significant plaque <50% noted in the CCA.  Left Carotid: There is evidence in the left ICA of a 1-39% stenosis.       Non-hemodynamically significant plaque noted in the CCA. The ECA       appears >50% stenosed.  Vertebrals: Right vertebral artery was patent with antegrade flow. A partial       left subclavian artery steal was noted. Left verterbral artery has       to and fro flow. Subclavians: Bilateral subclavian arteries were stenotic. [facility]ier PV consult       given subclavian disease and decreased left arm pressure.  ASSESSMENT AND PLAN:  1. lower extremity peripheral arterial disease: Patient has palpable femoral pulses bilaterally and no typical symptoms of claudication.  She has known severe iliac stenosis She has been managed medically and has done well with this approach.  She is not able to take statin drugs or aspirin because of intolerance to both medicines.  She states that she is a "free bleeder" and cannot tolerate any antiplatelet therapy.  She will return for lower extremity vascular studies.  2.  Left subclavian stenosis: Also asymptomatic.  She specifically denies any arm claudication symptoms.  There is retrograde vertebral flow noted on her most recent Doppler study.  Continue observation.  3.  Tobacco abuse: Cessation counseling done.  The patient is a long-standing smoker and does not feel she is ready to quit.  4.  Type 2 diabetes: Treated by her primary care physician.  Current medicines are reviewed with the patient today.  The patient does not have concerns regarding medicines.  Labs/ tests ordered today include:   Orders Placed This Encounter  Procedures  . EKG 12-Lead    Disposition:   FU 1 year   Signed, Sherren Mocha, MD  02/21/2018 1:19 PM    [facility] [address], [place], [place]  [address] Phone:  [phone]; Fax: [phone]

## 2018-02-21 NOTE — Patient Instructions (Signed)
Medication Instructions:  Your provider recommends that you continue on your current medications as directed. Please refer to the Current Medication list given to you today.    Labwork: No new orders   Testing/Procedures: Dr. Burt Knack recommends you have ABIs.  Dr. Burt Knack recommends you have aortoiliac dopplers.   Follow-Up: Your provider wants you to follow-up in: 1 year with Dr. Burt Knack. You will receive a reminder letter in the mail two months in advance. If you don't receive a letter, please call our office to schedule the follow-up appointment.    Any Other Special Instructions Will Be Listed Below (If Applicable).     If you need a refill on your cardiac medications before your next appointment, please call your pharmacy.

## 2018-02-22 DIAGNOSIS — C670 Malignant neoplasm of trigone of bladder: Secondary | ICD-10-CM | POA: Diagnosis not present

## 2018-03-15 ENCOUNTER — Other Ambulatory Visit: Payer: Self-pay

## 2018-03-15 ENCOUNTER — Telehealth: Payer: Self-pay | Admitting: Family Medicine

## 2018-03-15 DIAGNOSIS — E559 Vitamin D deficiency, unspecified: Secondary | ICD-10-CM

## 2018-03-15 NOTE — Telephone Encounter (Signed)
Orders placed per patients request.

## 2018-03-19 ENCOUNTER — Telehealth: Payer: Self-pay | Admitting: Family Medicine

## 2018-03-19 DIAGNOSIS — E1169 Type 2 diabetes mellitus with other specified complication: Secondary | ICD-10-CM

## 2018-03-19 DIAGNOSIS — E119 Type 2 diabetes mellitus without complications: Secondary | ICD-10-CM

## 2018-03-19 DIAGNOSIS — E785 Hyperlipidemia, unspecified: Secondary | ICD-10-CM

## 2018-03-19 DIAGNOSIS — J439 Emphysema, unspecified: Secondary | ICD-10-CM

## 2018-03-19 NOTE — Telephone Encounter (Signed)
Patient aware.

## 2018-03-19 NOTE — Telephone Encounter (Signed)
I put labs in for patient so she can come in and do them ahead of the appointment time.

## 2018-03-20 ENCOUNTER — Other Ambulatory Visit: Payer: Medicare Other

## 2018-03-20 DIAGNOSIS — E785 Hyperlipidemia, unspecified: Secondary | ICD-10-CM | POA: Diagnosis not present

## 2018-03-20 DIAGNOSIS — E1169 Type 2 diabetes mellitus with other specified complication: Secondary | ICD-10-CM | POA: Diagnosis not present

## 2018-03-20 DIAGNOSIS — E119 Type 2 diabetes mellitus without complications: Secondary | ICD-10-CM | POA: Diagnosis not present

## 2018-03-20 DIAGNOSIS — E559 Vitamin D deficiency, unspecified: Secondary | ICD-10-CM

## 2018-03-20 DIAGNOSIS — J439 Emphysema, unspecified: Secondary | ICD-10-CM

## 2018-03-20 LAB — BAYER DCA HB A1C WAIVED: HB A1C (BAYER DCA - WAIVED): 9.3 % — ABNORMAL HIGH (ref <7.0)

## 2018-03-21 ENCOUNTER — Ambulatory Visit (INDEPENDENT_AMBULATORY_CARE_PROVIDER_SITE_OTHER): Payer: Medicare Other

## 2018-03-21 DIAGNOSIS — I739 Peripheral vascular disease, unspecified: Secondary | ICD-10-CM | POA: Diagnosis not present

## 2018-03-21 LAB — CBC WITH DIFFERENTIAL/PLATELET
Basophils Absolute: 0.1 10*3/uL (ref 0.0–0.2)
Basos: 1 %
EOS (ABSOLUTE): 0.2 10*3/uL (ref 0.0–0.4)
Eos: 2 %
Hematocrit: 34.8 % (ref 34.0–46.6)
Hemoglobin: 11.7 g/dL (ref 11.1–15.9)
Immature Grans (Abs): 0.1 10*3/uL (ref 0.0–0.1)
Immature Granulocytes: 1 %
Lymphocytes Absolute: 1.9 10*3/uL (ref 0.7–3.1)
Lymphs: 20 %
MCH: 32.3 pg (ref 26.6–33.0)
MCHC: 33.6 g/dL (ref 31.5–35.7)
MCV: 96 fL (ref 79–97)
Monocytes Absolute: 0.7 10*3/uL (ref 0.1–0.9)
Monocytes: 7 %
Neutrophils Absolute: 6.9 10*3/uL (ref 1.4–7.0)
Neutrophils: 69 %
Platelets: 348 10*3/uL (ref 150–450)
RBC: 3.62 x10E6/uL — ABNORMAL LOW (ref 3.77–5.28)
RDW: 13.7 % (ref 12.3–15.4)
WBC: 9.9 10*3/uL (ref 3.4–10.8)

## 2018-03-21 LAB — CMP14+EGFR
ALT: 8 IU/L (ref 0–32)
AST: 11 IU/L (ref 0–40)
Albumin/Globulin Ratio: 1.5 (ref 1.2–2.2)
Albumin: 4 g/dL (ref 3.5–4.8)
Alkaline Phosphatase: 64 IU/L (ref 39–117)
BUN/Creatinine Ratio: 24 (ref 12–28)
BUN: 20 mg/dL (ref 8–27)
Bilirubin Total: 0.5 mg/dL (ref 0.0–1.2)
CO2: 25 mmol/L (ref 20–29)
Calcium: 9.3 mg/dL (ref 8.7–10.3)
Chloride: 101 mmol/L (ref 96–106)
Creatinine, Ser: 0.84 mg/dL (ref 0.57–1.00)
GFR calc Af Amer: 78 mL/min/{1.73_m2} (ref >59)
GFR calc non Af Amer: 67 mL/min/{1.73_m2} (ref >59)
Globulin, Total: 2.6 g/dL (ref 1.5–4.5)
Glucose: 115 mg/dL — ABNORMAL HIGH (ref 65–99)
Potassium: 4.7 mmol/L (ref 3.5–5.2)
Sodium: 140 mmol/L (ref 134–144)
Total Protein: 6.6 g/dL (ref 6.0–8.5)

## 2018-03-21 LAB — LIPID PANEL
Chol/HDL Ratio: 3.6 ratio (ref 0.0–4.4)
Cholesterol, Total: 170 mg/dL (ref 100–199)
HDL: 47 mg/dL (ref >39)
LDL Calculated: 105 mg/dL — ABNORMAL HIGH (ref 0–99)
Triglycerides: 88 mg/dL (ref 0–149)
VLDL Cholesterol Cal: 18 mg/dL (ref 5–40)

## 2018-03-21 LAB — VITAMIN D 25 HYDROXY (VIT D DEFICIENCY, FRACTURES): Vit D, 25-Hydroxy: 27.8 ng/mL — ABNORMAL LOW (ref 30.0–100.0)

## 2018-03-22 ENCOUNTER — Ambulatory Visit: Payer: Medicare Other | Admitting: Family Medicine

## 2018-03-25 ENCOUNTER — Ambulatory Visit (INDEPENDENT_AMBULATORY_CARE_PROVIDER_SITE_OTHER): Payer: Medicare Other | Admitting: Family Medicine

## 2018-03-25 ENCOUNTER — Encounter: Payer: Self-pay | Admitting: Family Medicine

## 2018-03-25 VITALS — BP 134/76 | HR 98 | Temp 97.6°F | Ht 61.0 in | Wt 120.0 lb

## 2018-03-25 DIAGNOSIS — T466X5A Adverse effect of antihyperlipidemic and antiarteriosclerotic drugs, initial encounter: Secondary | ICD-10-CM | POA: Diagnosis not present

## 2018-03-25 DIAGNOSIS — J439 Emphysema, unspecified: Secondary | ICD-10-CM | POA: Diagnosis not present

## 2018-03-25 DIAGNOSIS — E1169 Type 2 diabetes mellitus with other specified complication: Secondary | ICD-10-CM | POA: Diagnosis not present

## 2018-03-25 DIAGNOSIS — M791 Myalgia, unspecified site: Secondary | ICD-10-CM

## 2018-03-25 DIAGNOSIS — E785 Hyperlipidemia, unspecified: Secondary | ICD-10-CM | POA: Diagnosis not present

## 2018-03-25 NOTE — Progress Notes (Signed)
BP 134/76   Pulse 98   Temp 97.6 F (36.4 C) (Oral)   Ht 5\' 1"  (1.549 m)   Wt 120 lb (54.4 kg)   BMI 22.67 kg/m    Subjective:    Patient ID: Hannah French, female    DOB: 05/22/1941, 77 y.o.   MRN: [id number]  HPI: Hannah French is a 77 y.o. female presenting on 03/25/2018 for Discuss labs; Diabetes; Hypertension; and Hyperlipidemia   HPI Type 2 diabetes mellitus Patient comes in today for recheck of his diabetes. Patient has been currently taking glimepiride. Patient is not currently on an ACE inhibitor/ARB. Patient has not seen an ophthalmologist this year. Patient denies any issues with their feet.   Hyperlipidemia Patient is coming in for recheck of his hyperlipidemia. The patient is currently taking no medication because she has had an intolerance to statins. They deny any issues with myalgias or history of liver damage from it. They deny any focal numbness or weakness or chest pain.   COPD Patient is coming in for COPD recheck today.  He is currently on albuterol and Advair and Incruse Ellipta.  He has a mild chronic cough but denies any major coughing spells or wheezing spells.  He has 0nighttime symptoms per week and 1daytime symptoms per week currently.   Relevant past medical, surgical, family and social history reviewed and updated as indicated. Interim medical history since our last visit reviewed. Allergies and medications reviewed and updated.  Review of Systems  Constitutional: Negative for chills and fever.  HENT: Negative for congestion, ear discharge and ear pain.   Eyes: Negative for redness and visual disturbance.  Respiratory: Positive for cough. Negative for chest tightness and shortness of breath.   Cardiovascular: Negative for chest pain and leg swelling.  Genitourinary: Negative for difficulty urinating and dysuria.  Musculoskeletal: Negative for back pain and gait problem.  Skin: Negative for rash.  Neurological: Negative for light-headedness and  headaches.  Psychiatric/Behavioral: Negative for agitation and behavioral problems.  All other systems reviewed and are negative.   Per HPI unless specifically indicated above   Allergies as of 03/25/2018      Reactions   Bactrim [sulfamethoxazole-trimethoprim] Nausea And Vomiting   Contrast Media [iodinated Diagnostic Agents] Shortness Of Breath   Per pt   Iohexol Other (See Comments)   PASSED OUT DURING THE TEST   Metformin Swelling   Pravastatin Swelling   Myalgia   Omnicef [cefdinir]    Rofecoxib Other (See Comments)   UNKNOWN TO PATIENT      Medication List        Accurate as of 03/25/18 12:03 PM. Always use your most recent med list.          albuterol 108 (90 Base) MCG/ACT inhaler Commonly known as:  PROVENTIL HFA;VENTOLIN HFA Inhale 2 puffs into the lungs every 6 (six) hours as needed for wheezing or shortness of breath.   CALCIUM 1200+D3 PO Take by mouth.   cholecalciferol 1000 units tablet Commonly known as:  VITAMIN D Take 1,000 Units by mouth daily.   cyclobenzaprine 5 MG tablet Commonly known as:  FLEXERIL Take 1 tablet (5 mg total) by mouth 3 (three) times daily as needed for muscle spasms.   Fluticasone-Salmeterol 100-50 MCG/DOSE Aepb Commonly known as:  ADVAIR Inhale 1 puff into the lungs 2 (two) times daily.   furosemide 20 MG tablet Commonly known as:  LASIX Take 1 tablet (20 mg total) by mouth as needed.   glimepiride 4  MG tablet Commonly known as:  AMARYL TAKE (1) TABLET DAILY AS DIRECTED.   metoprolol succinate 25 MG 24 hr tablet Commonly known as:  TOPROL-XL Take 1 tablet (25 mg total) by mouth daily.   ONETOUCH DELICA LANCETS 74Y Misc USE   TO CHECK GLUCOSE TWICE DAILY AND AS NEEDED   ONETOUCH VERIO test strip Generic drug:  glucose blood USE  STRIP TO CHECK GLUCOSE TWICE DAILY AND AS NEEDED   umeclidinium bromide 62.5 MCG/INH Aepb Commonly known as:  INCRUSE ELLIPTA Inhale 1 puff into the lungs daily.   VITAMIN B COMPLEX  PO Take by mouth daily.          Objective:    BP 134/76   Pulse 98   Temp 97.6 F (36.4 C) (Oral)   Ht 5\' 1"  (1.549 m)   Wt 120 lb (54.4 kg)   BMI 22.67 kg/m   Wt Readings from Last 3 Encounters:  03/25/18 120 lb (54.4 kg)  02/21/18 119 lb 3.2 oz (54.1 kg)  01/17/18 120 lb 9.6 oz (54.7 kg)    Physical Exam  Constitutional: She is oriented to person, place, and time. She appears well-developed and well-nourished. No distress.  Eyes: Pupils are equal, round, and reactive to light. Conjunctivae and EOM are normal.  Cardiovascular: Normal rate, regular rhythm, normal heart sounds and intact distal pulses.  No murmur heard. Pulmonary/Chest: Effort normal and breath sounds normal. No respiratory distress. She has no wheezes.  Musculoskeletal: Normal range of motion. She exhibits no edema.  Neurological: She is alert and oriented to person, place, and time. Coordination normal.  Skin: Skin is warm and dry. No rash noted. She is not diaphoretic.  Psychiatric: She has a normal mood and affect. Her behavior is normal.  Nursing note and vitals reviewed.       Assessment & Plan:   Problem List Items Addressed This Visit      Respiratory   COPD (chronic obstructive pulmonary disease) ([place])     Endocrine   Hyperlipidemia associated with type 2 diabetes mellitus ([place])   Type 2 diabetes mellitus ([place]) - Primary   Relevant Orders   Bayer DCA Hb A1c Waived     Other   Myalgia due to statin     Doing well on her medications, will continue for now and recheck A1c and see back in 3 months.  Patient has been intolerant of statins before and that is why she is not on one  Follow up plan: Return in about 6 months (around 09/25/2018), or if symptoms worsen or fail to improve, for Come back for A1c blood draw in 3 months and visit in 6 months.  Counseling provided for all of the vaccine components Orders Placed This Encounter  Procedures  . Bayer [facility] Hb A1c T[place], MD [facility] 03/25/2018, 12:03 PM

## 2018-03-29 DIAGNOSIS — M791 Myalgia, unspecified site: Secondary | ICD-10-CM | POA: Insufficient documentation

## 2018-03-29 DIAGNOSIS — T466X5A Adverse effect of antihyperlipidemic and antiarteriosclerotic drugs, initial encounter: Secondary | ICD-10-CM | POA: Insufficient documentation

## 2018-04-19 ENCOUNTER — Ambulatory Visit: Payer: Medicare Other | Admitting: *Deleted

## 2018-04-19 DIAGNOSIS — Z23 Encounter for immunization: Secondary | ICD-10-CM

## 2018-04-19 NOTE — Progress Notes (Signed)
Pt given 2nd Shingrix vaccine Tolerated well 

## 2018-05-02 DIAGNOSIS — L821 Other seborrheic keratosis: Secondary | ICD-10-CM | POA: Diagnosis not present

## 2018-05-02 DIAGNOSIS — L814 Other melanin hyperpigmentation: Secondary | ICD-10-CM | POA: Diagnosis not present

## 2018-05-02 DIAGNOSIS — C44319 Basal cell carcinoma of skin of other parts of face: Secondary | ICD-10-CM | POA: Diagnosis not present

## 2018-05-02 DIAGNOSIS — L570 Actinic keratosis: Secondary | ICD-10-CM | POA: Diagnosis not present

## 2018-05-02 DIAGNOSIS — D485 Neoplasm of uncertain behavior of skin: Secondary | ICD-10-CM | POA: Diagnosis not present

## 2018-05-13 ENCOUNTER — Telehealth: Payer: Self-pay | Admitting: Family Medicine

## 2018-05-13 NOTE — Telephone Encounter (Signed)
Patient will only see Dr. Warrick Parisian, appointment scheduled for 05/16/18.

## 2018-05-16 ENCOUNTER — Encounter: Payer: Self-pay | Admitting: Family Medicine

## 2018-05-16 ENCOUNTER — Ambulatory Visit (INDEPENDENT_AMBULATORY_CARE_PROVIDER_SITE_OTHER): Payer: Medicare Other | Admitting: Family Medicine

## 2018-05-16 VITALS — BP 141/81 | HR 102 | Temp 98.3°F | Ht 61.0 in | Wt 120.0 lb

## 2018-05-16 DIAGNOSIS — N3000 Acute cystitis without hematuria: Secondary | ICD-10-CM

## 2018-05-16 DIAGNOSIS — R109 Unspecified abdominal pain: Secondary | ICD-10-CM | POA: Diagnosis not present

## 2018-05-16 LAB — MICROSCOPIC EXAMINATION
Epithelial Cells (non renal): >10 /hpf — AB (ref 0–10)
Renal Epithel, UA: NONE SEEN /hpf

## 2018-05-16 LAB — URINALYSIS, COMPLETE
Bilirubin, UA: NEGATIVE
Glucose, UA: NEGATIVE
Ketones, UA: NEGATIVE
Leukocytes, UA: NEGATIVE
Nitrite, UA: NEGATIVE
Protein, UA: NEGATIVE
Specific Gravity, UA: 1.015 (ref 1.005–1.030)
Urobilinogen, Ur: 0.2 mg/dL (ref 0.2–1.0)
pH, UA: 6.5 (ref 5.0–7.5)

## 2018-05-16 MED ORDER — NITROFURANTOIN MONOHYD MACRO 100 MG PO CAPS: 100.0000 mg | ORAL_CAPSULE | Freq: Two times a day (BID) | ORAL | 0 refills | Status: DC

## 2018-05-16 NOTE — Progress Notes (Signed)
BP (!) 141/81   Pulse (!) 102   Temp 98.3 F (36.8 C)   Ht 5\' 1"  (1.549 m)   Wt 120 lb (54.4 kg)   SpO2 95%   BMI 22.67 kg/m    Subjective:    Patient ID: Hannah French, female    DOB: September 03, 1941, 77 y.o.   MRN: [id number]  HPI: Hannah French is a 77 y.o. female presenting on 05/16/2018 for Flank Pain (right flank pain x 2 weeks)   HPI Right flank pain and urinary frequency She is coming in today with complaints of pain and urinary frequency that is been going on for 2 weeks but worse over the past few days.  She denies any fevers or chills.  She has been having a lot of urinary frequency and urgency where she feels like she cannot make it to the restroom that has been increasing.  She has had frequent lower abdominal pain intermittently along with the right flank pain.  She has had both renal stones and kidney infections recently.  Relevant past medical, surgical, family and social history reviewed and updated as indicated. Interim medical history since our last visit reviewed. Allergies and medications reviewed and updated.  Review of Systems  Constitutional: Negative for chills and fever.  Eyes: Negative for visual disturbance.  Respiratory: Negative for chest tightness and shortness of breath.   Cardiovascular: Negative for chest pain and leg swelling.  Genitourinary: Positive for dysuria, flank pain, frequency and urgency. Negative for difficulty urinating, vaginal bleeding, vaginal discharge and vaginal pain.  Musculoskeletal: Negative for back pain and gait problem.  Skin: Negative for rash.  Neurological: Negative for light-headedness and headaches.  Psychiatric/Behavioral: Negative for agitation and behavioral problems.  All other systems reviewed and are negative.   Per HPI unless specifically indicated above   Allergies as of 05/16/2018      Reactions   Bactrim [sulfamethoxazole-trimethoprim] Nausea And Vomiting   Contrast Media [iodinated Diagnostic Agents]  Shortness Of Breath   Per pt   Iohexol Other (See Comments)   PASSED OUT DURING THE TEST   Metformin Swelling   Pravastatin Swelling   Myalgia   Omnicef [cefdinir]    Rofecoxib Other (See Comments)   UNKNOWN TO PATIENT      Medication List        Accurate as of 05/16/18  4:42 PM. Always use your most recent med list.          albuterol 108 (90 Base) MCG/ACT inhaler Commonly known as:  PROVENTIL HFA;VENTOLIN HFA Inhale 2 puffs into the lungs every 6 (six) hours as needed for wheezing or shortness of breath.   CALCIUM 1200+D3 PO Take by mouth.   cholecalciferol 1000 units tablet Commonly known as:  VITAMIN D Take 1,000 Units by mouth daily.   cyclobenzaprine 5 MG tablet Commonly known as:  FLEXERIL Take 1 tablet (5 mg total) by mouth 3 (three) times daily as needed for muscle spasms.   Fluticasone-Salmeterol 100-50 MCG/DOSE Aepb Commonly known as:  ADVAIR Inhale 1 puff into the lungs 2 (two) times daily.   furosemide 20 MG tablet Commonly known as:  LASIX Take 1 tablet (20 mg total) by mouth as needed.   glimepiride 4 MG tablet Commonly known as:  AMARYL TAKE (1) TABLET DAILY AS DIRECTED.   metoprolol succinate 25 MG 24 hr tablet Commonly known as:  TOPROL-XL Take 1 tablet (25 mg total) by mouth daily.   nitrofurantoin (macrocrystal-monohydrate) 100 MG capsule Commonly known as:  MACROBID Take 1 capsule (100 mg total) by mouth 2 (two) times daily. 1 po BId   ONETOUCH DELICA LANCETS 33X Misc USE   TO CHECK GLUCOSE TWICE DAILY AND AS NEEDED   ONETOUCH VERIO test strip Generic drug:  glucose blood USE  STRIP TO CHECK GLUCOSE TWICE DAILY AND AS NEEDED   umeclidinium bromide 62.5 MCG/INH Aepb Commonly known as:  INCRUSE ELLIPTA Inhale 1 puff into the lungs daily.   VITAMIN B COMPLEX PO Take by mouth daily.          Objective:    BP (!) 141/81   Pulse (!) 102   Temp 98.3 F (36.8 C)   Ht 5\' 1"  (1.549 m)   Wt 120 lb (54.4 kg)   SpO2 95%   BMI  22.67 kg/m   Wt Readings from Last 3 Encounters:  05/16/18 120 lb (54.4 kg)  03/25/18 120 lb (54.4 kg)  02/21/18 119 lb 3.2 oz (54.1 kg)    Physical Exam  Constitutional: She is oriented to person, place, and time. She appears well-developed and well-nourished. No distress.  Eyes: Conjunctivae are normal.  Cardiovascular: Normal rate, regular rhythm, normal heart sounds and intact distal pulses.  No murmur heard. Pulmonary/Chest: Effort normal and breath sounds normal. No respiratory distress. She has no wheezes.  Abdominal: Soft. Bowel sounds are normal. She exhibits no distension and no mass. There is no hepatosplenomegaly. There is no tenderness. There is CVA tenderness. There is no rigidity, no rebound and no guarding.  Neurological: She is alert and oriented to person, place, and time. Coordination normal.  Skin: Skin is warm and dry. No rash noted. She is not diaphoretic.  Psychiatric: She has a normal mood and affect. Her behavior is normal.  Nursing note and vitals reviewed.       Assessment & Plan:   Problem List Items Addressed This Visit    None    Visit Diagnoses    Acute cystitis without hematuria    -  Primary   Relevant Medications   nitrofurantoin, macrocrystal-monohydrate, (MACROBID) 100 MG capsule   Other Relevant Orders   Urinalysis, Complete (Completed)   Urine Culture (Completed)   Flank pain, acute       Relevant Medications   nitrofurantoin, macrocrystal-monohydrate, (MACROBID) 100 MG capsule       Follow up plan: Return if symptoms worsen or fail to improve.  Counseling provided for all of the vaccine components Orders Placed This Encounter  Procedures  . Urine Culture  . Urinalysis, Complete    Caryl Pina, MD [facility] 05/16/2018, 4:42 PM

## 2018-05-19 LAB — URINE CULTURE

## 2018-05-21 DIAGNOSIS — H6121 Impacted cerumen, right ear: Secondary | ICD-10-CM | POA: Diagnosis not present

## 2018-05-23 ENCOUNTER — Telehealth: Payer: Self-pay | Admitting: Family Medicine

## 2018-05-23 DIAGNOSIS — M25561 Pain in right knee: Secondary | ICD-10-CM | POA: Diagnosis not present

## 2018-05-23 DIAGNOSIS — M25562 Pain in left knee: Secondary | ICD-10-CM | POA: Diagnosis not present

## 2018-05-23 NOTE — Telephone Encounter (Signed)
Pt aware of urine culture results

## 2018-05-28 DIAGNOSIS — Z1231 Encounter for screening mammogram for malignant neoplasm of breast: Secondary | ICD-10-CM | POA: Diagnosis not present

## 2018-05-28 LAB — HM MAMMOGRAPHY

## 2018-05-31 ENCOUNTER — Ambulatory Visit (INDEPENDENT_AMBULATORY_CARE_PROVIDER_SITE_OTHER): Payer: Medicare Other | Admitting: Family Medicine

## 2018-05-31 ENCOUNTER — Encounter: Payer: Self-pay | Admitting: Family Medicine

## 2018-05-31 VITALS — BP 125/72 | HR 83 | Temp 97.0°F | Ht 61.0 in | Wt 118.2 lb

## 2018-05-31 DIAGNOSIS — E1169 Type 2 diabetes mellitus with other specified complication: Secondary | ICD-10-CM | POA: Diagnosis not present

## 2018-05-31 NOTE — Progress Notes (Signed)
BP 125/72   Pulse 83   Temp (!) 97 F (36.1 C) (Oral)   Ht 5\' 1"  (1.549 m)   Wt 118 lb 3.2 oz (53.6 kg)   SpO2 95%   BMI 22.33 kg/m    Subjective:    Patient ID: Hannah French, female    DOB: 15-Jul-1941, 77 y.o.   MRN: [id number]  HPI: Hannah French is a 77 y.o. female presenting on 05/31/2018 for blood sugar running high (Patient states that she got shots in her knees x 1 week ago by [facility] and her bs has been running high.  States it has been running 300-400.)   HPI Elevated blood sugar Patient is coming in complaining of elevated blood sugar this been going on for 1 week.  She says she got injections of both of her knees 1 week ago from a orthopedic and since then her blood sugar has been running in the 3 or 400s and she cannot seem to get it down.  On previous occasions when she has had this she is used a long-acting insulin to help until it comes down after the injections.  She said this time is just lasting a little bit longer than it normally does staying up.  She has urinary frequency but denies any other symptoms from it.  She is also planning to possibly have another small procedure next week and needs something to get her blood sugars down before then.  Relevant past medical, surgical, family and social history reviewed and updated as indicated. Interim medical history since our last visit reviewed. Allergies and medications reviewed and updated.  Review of Systems  Constitutional: Negative for chills and fever.  Eyes: Negative for visual disturbance.  Respiratory: Negative for chest tightness and shortness of breath.   Cardiovascular: Negative for chest pain and leg swelling.  Gastrointestinal: Negative for abdominal pain.  Genitourinary: Positive for frequency. Negative for dysuria.  Musculoskeletal: Negative for back pain and gait problem.  Skin: Negative for rash.  Neurological: Negative for dizziness, light-headedness and headaches.  Psychiatric/Behavioral:  Negative for agitation and behavioral problems.  All other systems reviewed and are negative.   Per HPI unless specifically indicated above   Allergies as of 05/31/2018      Reactions   Bactrim [sulfamethoxazole-trimethoprim] Nausea And Vomiting   Contrast Media [iodinated Diagnostic Agents] Shortness Of Breath   Per pt   Iohexol Other (See Comments)   PASSED OUT DURING THE TEST   Metformin Swelling   Pravastatin Swelling   Myalgia   Omnicef [cefdinir]    Rofecoxib Other (See Comments)   UNKNOWN TO PATIENT      Medication List        Accurate as of 05/31/18  1:56 PM. Always use your most recent med list.          CALCIUM 1200+D3 PO Take by mouth.   cholecalciferol 1000 units tablet Commonly known as:  VITAMIN D Take 1,000 Units by mouth daily.   cyclobenzaprine 5 MG tablet Commonly known as:  FLEXERIL Take 1 tablet (5 mg total) by mouth 3 (three) times daily as needed for muscle spasms.   Fluticasone-Salmeterol 100-50 MCG/DOSE Aepb Commonly known as:  ADVAIR Inhale 1 puff into the lungs 2 (two) times daily.   furosemide 20 MG tablet Commonly known as:  LASIX Take 1 tablet (20 mg total) by mouth as needed.   glimepiride 4 MG tablet Commonly known as:  AMARYL TAKE (1) TABLET DAILY AS DIRECTED.  metoprolol succinate 25 MG 24 hr tablet Commonly known as:  TOPROL-XL Take 1 tablet (25 mg total) by mouth daily.   ONETOUCH DELICA LANCETS 95G Misc USE   TO CHECK GLUCOSE TWICE DAILY AND AS NEEDED   ONETOUCH VERIO test strip Generic drug:  glucose blood USE  STRIP TO CHECK GLUCOSE TWICE DAILY AND AS NEEDED   VITAMIN B COMPLEX PO Take by mouth daily.          Objective:    BP 125/72   Pulse 83   Temp (!) 97 F (36.1 C) (Oral)   Ht 5\' 1"  (1.549 m)   Wt 118 lb 3.2 oz (53.6 kg)   SpO2 95%   BMI 22.33 kg/m   Wt Readings from Last 3 Encounters:  05/31/18 118 lb 3.2 oz (53.6 kg)  05/16/18 120 lb (54.4 kg)  03/25/18 120 lb (54.4 kg)    Physical Exam    Constitutional: She is oriented to person, place, and time. She appears well-developed and well-nourished. No distress.  Eyes: Conjunctivae are normal.  Cardiovascular: Normal rate, regular rhythm, normal heart sounds and intact distal pulses.  No murmur heard. Pulmonary/Chest: Effort normal and breath sounds normal. No respiratory distress. She has no wheezes.  Musculoskeletal: Normal range of motion. She exhibits no edema.  Neurological: She is alert and oriented to person, place, and time. Coordination normal.  Skin: Skin is warm and dry. No rash noted. She is not diaphoretic.  Psychiatric: She has a normal mood and affect. Her behavior is normal.  Nursing note and vitals reviewed.       Assessment & Plan:   Problem List Items Addressed This Visit      Endocrine   Type 2 diabetes mellitus ([place]) - Primary      Gave sample for tresiba to help with sugar control while having received the injections to both of her knees just recently.  Recommended for her to start with 5 units daily and titrate as appropriate  Follow up plan: Return if symptoms worsen or fail to improve.  Counseling provided for all of the vaccine components No orders of the defined types were placed in this encounter.   Caryl Pina, MD [facility] 05/31/2018, 1:56 PM

## 2018-06-01 ENCOUNTER — Other Ambulatory Visit: Payer: Self-pay | Admitting: Family Medicine

## 2018-06-04 DIAGNOSIS — C44319 Basal cell carcinoma of skin of other parts of face: Secondary | ICD-10-CM | POA: Diagnosis not present

## 2018-07-10 ENCOUNTER — Ambulatory Visit: Payer: Medicare Other

## 2018-08-02 DIAGNOSIS — L565 Disseminated superficial actinic porokeratosis (DSAP): Secondary | ICD-10-CM | POA: Diagnosis not present

## 2018-08-02 DIAGNOSIS — L570 Actinic keratosis: Secondary | ICD-10-CM | POA: Diagnosis not present

## 2018-08-13 ENCOUNTER — Emergency Department (HOSPITAL_COMMUNITY): Payer: Medicare Other

## 2018-08-13 ENCOUNTER — Encounter (HOSPITAL_COMMUNITY): Payer: Self-pay | Admitting: Emergency Medicine

## 2018-08-13 ENCOUNTER — Emergency Department (HOSPITAL_COMMUNITY)
Admission: EM | Admit: 2018-08-13 | Discharge: 2018-08-13 | Disposition: A | Discharge status: Home / Self Care | Payer: Medicare Other | Attending: Emergency Medicine | Admitting: Emergency Medicine

## 2018-08-13 DIAGNOSIS — Z79899 Other long term (current) drug therapy: Secondary | ICD-10-CM | POA: Diagnosis not present

## 2018-08-13 DIAGNOSIS — R0602 Shortness of breath: Secondary | ICD-10-CM

## 2018-08-13 DIAGNOSIS — F17210 Nicotine dependence, cigarettes, uncomplicated: Secondary | ICD-10-CM | POA: Insufficient documentation

## 2018-08-13 DIAGNOSIS — E119 Type 2 diabetes mellitus without complications: Secondary | ICD-10-CM | POA: Insufficient documentation

## 2018-08-13 DIAGNOSIS — J449 Chronic obstructive pulmonary disease, unspecified: Secondary | ICD-10-CM | POA: Insufficient documentation

## 2018-08-13 DIAGNOSIS — R0600 Dyspnea, unspecified: Secondary | ICD-10-CM | POA: Diagnosis not present

## 2018-08-13 DIAGNOSIS — R0789 Other chest pain: Secondary | ICD-10-CM | POA: Diagnosis not present

## 2018-08-13 LAB — CBC WITH DIFFERENTIAL/PLATELET
Abs Immature Granulocytes: 0.1 10*3/uL — ABNORMAL HIGH (ref 0.00–0.07)
Basophils Absolute: 0.1 10*3/uL (ref 0.0–0.1)
Basophils Relative: 0 %
Eosinophils Absolute: 0.1 10*3/uL (ref 0.0–0.5)
Eosinophils Relative: 0 %
HCT: 39.2 % (ref 36.0–46.0)
Hemoglobin: 12.5 g/dL (ref 12.0–15.0)
Immature Granulocytes: 1 %
Lymphocytes Relative: 8 %
Lymphs Abs: 1.3 10*3/uL (ref 0.7–4.0)
MCH: 32.3 pg (ref 26.0–34.0)
MCHC: 31.9 g/dL (ref 30.0–36.0)
MCV: 101.3 fL — ABNORMAL HIGH (ref 80.0–100.0)
Monocytes Absolute: 1.1 10*3/uL — ABNORMAL HIGH (ref 0.1–1.0)
Monocytes Relative: 6 %
Neutro Abs: 15.3 10*3/uL — ABNORMAL HIGH (ref 1.7–7.7)
Neutrophils Relative %: 85 %
Platelets: 276 10*3/uL (ref 150–400)
RBC: 3.87 MIL/uL (ref 3.87–5.11)
RDW: 14.9 % (ref 11.5–15.5)
WBC: 18 10*3/uL — ABNORMAL HIGH (ref 4.0–10.5)
nRBC: 0.1 % (ref 0.0–0.2)

## 2018-08-13 LAB — COMPREHENSIVE METABOLIC PANEL
ALT: 15 U/L (ref 0–44)
AST: 20 U/L (ref 15–41)
Albumin: 3.9 g/dL (ref 3.5–5.0)
Alkaline Phosphatase: 51 U/L (ref 38–126)
Anion gap: 10 (ref 5–15)
BUN: 15 mg/dL (ref 8–23)
CO2: 24 mmol/L (ref 22–32)
Calcium: 9.4 mg/dL (ref 8.9–10.3)
Chloride: 102 mmol/L (ref 98–111)
Creatinine, Ser: 0.97 mg/dL (ref 0.44–1.00)
GFR calc Af Amer: >60 mL/min (ref >60)
GFR calc non Af Amer: 55 mL/min — ABNORMAL LOW (ref >60)
Glucose, Bld: 204 mg/dL — ABNORMAL HIGH (ref 70–99)
Potassium: 3.9 mmol/L (ref 3.5–5.1)
Sodium: 136 mmol/L (ref 135–145)
Total Bilirubin: 1.6 mg/dL — ABNORMAL HIGH (ref 0.3–1.2)
Total Protein: 7.4 g/dL (ref 6.5–8.1)

## 2018-08-13 LAB — BRAIN NATRIURETIC PEPTIDE: B Natriuretic Peptide: 202.7 pg/mL — ABNORMAL HIGH (ref 0.0–100.0)

## 2018-08-13 LAB — MAGNESIUM: Magnesium: 1.7 mg/dL (ref 1.7–2.4)

## 2018-08-13 LAB — TROPONIN I: Troponin I: <0.03 ng/mL (ref <0.03)

## 2018-08-13 LAB — CBG MONITORING, ED: Glucose-Capillary: 193 mg/dL — ABNORMAL HIGH (ref 70–99)

## 2018-08-13 LAB — I-STAT TROPONIN, ED: Troponin i, poc: 0.02 ng/mL (ref 0.00–0.08)

## 2018-08-13 LAB — PROTIME-INR
INR: 1.12
Prothrombin Time: 14.3 seconds (ref 11.4–15.2)

## 2018-08-13 MED ORDER — AZITHROMYCIN 250 MG PO TABS: 250.0000 mg | ORAL_TABLET | Freq: Every day | ORAL | 0 refills | Status: DC

## 2018-08-13 MED ORDER — MORPHINE SULFATE (PF) 4 MG/ML IV SOLN
4.0000 mg | Freq: Once | INTRAVENOUS | Status: DC
  Filled 2018-08-13: qty 1

## 2018-08-13 MED ORDER — KETOROLAC TROMETHAMINE 30 MG/ML IJ SOLN
15.0000 mg | Freq: Once | INTRAMUSCULAR | Status: AC
  Administered 2018-08-13: 15 mg via INTRAVENOUS
  Filled 2018-08-13: qty 1

## 2018-08-13 MED ORDER — METHYLPREDNISOLONE SODIUM SUCC 125 MG IJ SOLR
125.0000 mg | Freq: Once | INTRAMUSCULAR | Status: AC
  Administered 2018-08-13: 125 mg via INTRAVENOUS
  Filled 2018-08-13: qty 2

## 2018-08-13 MED ORDER — ALBUTEROL SULFATE (2.5 MG/3ML) 0.083% IN NEBU
5.0000 mg | INHALATION_SOLUTION | Freq: Once | RESPIRATORY_TRACT | Status: AC
  Administered 2018-08-13: 5 mg via RESPIRATORY_TRACT
  Filled 2018-08-13: qty 6

## 2018-08-13 MED ORDER — IPRATROPIUM-ALBUTEROL 0.5-2.5 (3) MG/3ML IN SOLN
3.0000 mL | Freq: Once | RESPIRATORY_TRACT | Status: AC
  Administered 2018-08-13: 3 mL via RESPIRATORY_TRACT
  Filled 2018-08-13: qty 3

## 2018-08-13 NOTE — ED Provider Notes (Signed)
Pt seen by Dr Vanita Panda.  Please see his note.  Plan on delta troponin.  Pt does not want to take steroids at home.  Consider dc on zpack  2nd troponin normal.  Discussd findings with patient.  She is ready for discharge   Hannah Rank, MD 08/13/18 1728

## 2018-08-13 NOTE — ED Notes (Signed)
Pt verbalized understanding of discharge paperwork and prescriptions.  °

## 2018-08-13 NOTE — ED Provider Notes (Signed)
[facility] EMERGENCY DEPARTMENT Provider Note   CSN: [id number] Arrival date & time: 08/13/18  1412     History   Chief Complaint Chief Complaint  Patient presents with  . Chest Pain    HPI Hannah French is a 77 y.o. female.  HPI  Patient presents with dyspnea, chest pain. Onset was earlier today, unsure exact onset purulent however, with pain throughout the left upper chest, tightness, and dyspnea, patient presents for evaluation. She is here with her daughter who assists with the HPI. Patient acknowledges a history of respiratory disease, continues to smoke 1 pack of cigarettes daily. No history of coronary disease that she knows about. Since onset symptoms been persistent, and it is unclear if she is taking any of her breathing treatments for relief. Symptoms are not necessarily worse with exertion, possibly worse with inspiration.   Past Medical History:  Diagnosis Date  . Abdominal bruit   . Bladder cancer ([place])   . Carotid bruit   . Diabetes mellitus    Type II  . Dyslipidemia   . Heart murmur   . Mitral valve prolapse   . Osteoporosis   . Pancreatitis, acute   . Pseudogout   . PVD (peripheral vascular disease) [facility])     Patient Active Problem List   Diagnosis Date Noted  . Myalgia due to statin 03/29/2018  . COPD (chronic obstructive pulmonary disease) ([place]) 10/26/2017  . Tobacco abuse 03/07/2016  . Type 2 diabetes mellitus ([place]) 08/26/2015  . Osteoporosis 09/02/2014  . Carotid stenosis 07/22/2012  . PVD 07/02/2009  . BLADDER CANCER 06/05/2009  . Hyperlipidemia associated with type 2 diabetes mellitus ([place]) 06/05/2009  . PSEUDOGOUT 06/05/2009  . MITRAL VALVE PROLAPSE 06/05/2009    Past Surgical History:  Procedure Laterality Date  . APPENDECTOMY    . Hysterectomy-type unspecified    . LAPAROSCOPIC CHOLECYSTECTOMY  2009     OB History   None      Home Medications    Prior to Admission medications   Medication Sig  Start Date End Date Taking? Authorizing Provider  B Complex Vitamins (VITAMIN B COMPLEX PO) Take 1 tablet by mouth daily.    Yes [provider]  Calcium-Magnesium-Vitamin D (CALCIUM 1200+D3 PO) Take 1 tablet by mouth daily.    Yes [provider]  cholecalciferol (VITAMIN D) 1000 units tablet Take 1,000 Units by mouth daily.   Yes [provider]  cyclobenzaprine (FLEXERIL) 5 MG tablet Take 1 tablet (5 mg total) by mouth 3 (three) times daily as needed for muscle spasms. 01/03/18  Yes Dettinger, Fransisca Kaufmann, MD  Fluticasone-Salmeterol (ADVAIR) 100-50 MCG/DOSE AEPB Inhale 1 puff into the lungs 2 (two) times daily. Patient taking differently: Inhale 1 puff into the lungs 2 (two) times daily as needed ("for flares").  04/04/17  Yes Dettinger, Fransisca Kaufmann, MD  furosemide (LASIX) 20 MG tablet Take 1 tablet (20 mg total) by mouth as needed. Patient taking differently: Take 20 mg by mouth as needed for fluid or edema.  08/30/16  Yes Dettinger, Fransisca Kaufmann, MD  glimepiride (AMARYL) 4 MG tablet TAKE (1) TABLET DAILY AS DIRECTED. Patient taking differently: Take 4-8 mg by mouth daily as needed (for elevated BGL).  09/05/17  Yes Dettinger, Fransisca Kaufmann, MD  ibuprofen (ADVIL,MOTRIN) 200 MG tablet Take 200-400 mg by mouth every 6 (six) hours as needed (for pain).   Yes [provider]  metoprolol succinate (TOPROL-XL) 25 MG 24 hr tablet Take 1 tablet (25 mg  total) by mouth daily. 02/21/18 02/16/19 Yes Sherren Mocha, MD  [facility] DELICA LANCETS 78L MISC USE   TO CHECK GLUCOSE TWICE DAILY AND AS NEEDED 01/04/18   Dettinger, Fransisca Kaufmann, MD  ONETOUCH VERIO test strip USE TO CHECK BLOOD GLUCOSE TWICE DAILY AND AS NEEDED Patient taking differently: 1 each by Other route 2 (two) times daily.  06/03/18   Dettinger, Fransisca Kaufmann, MD    Family History Family History  Problem Relation Age of Onset  . Heart failure Mother   . Heart attack Brother   . Heart attack Maternal Uncle   . Hypertension Sister     . Hypertension Brother   . Coronary artery disease Neg Hx        Early    Social History Social History   Tobacco Use  . Smoking status: Current Every Day Smoker    Packs/day: 1.00    Years: 30.00    Pack years: 30.00    Types: Cigarettes  . Smokeless tobacco: Never Used  Substance Use Topics  . Alcohol use: No  . Drug use: No     Allergies   Contrast media [iodinated diagnostic agents]; Iohexol; Metformin; Pravastatin; Sulfamethoxazole-trimethoprim; Albuterol; Cefdinir; Nsaids; Statins; Sulfa antibiotics; and Rofecoxib   Review of Systems Review of Systems  Constitutional:       Per HPI, otherwise negative  HENT:       Per HPI, otherwise negative  Respiratory:       Per HPI, otherwise negative  Cardiovascular:       Per HPI, otherwise negative  Gastrointestinal: Negative for vomiting.  Endocrine:       Negative aside from HPI  Genitourinary:       Neg aside from HPI   Musculoskeletal:       Per HPI, otherwise negative  Skin: Negative.   Neurological: Negative for syncope.     Physical Exam Updated Vital Signs Pulse (!) 118   Temp 98.1 F (36.7 C) (Oral)   Resp (!) 32   Ht 5\' 1"  (1.549 m)   Wt 54.9 kg   SpO2 96%   BMI 22.86 kg/m   Physical Exam  Constitutional: She is oriented to person, place, and time. She has a sickly appearance. No distress.  HENT:  Head: Normocephalic and atraumatic.  Eyes: Conjunctivae and EOM are normal.  Cardiovascular: Normal rate and regular rhythm.  Pulmonary/Chest: No stridor. Tachypnea noted. She has decreased breath sounds. She has no wheezes.  Abdominal: She exhibits no distension.  Musculoskeletal: She exhibits no edema.  Neurological: She is alert and oriented to person, place, and time. No cranial nerve deficit.  Skin: Skin is warm and dry.  Psychiatric: She has a normal mood and affect.  Nursing note and vitals reviewed.    ED Treatments / Results  Labs (all labs ordered are listed, but only abnormal  results are displayed) Labs Reviewed  COMPREHENSIVE METABOLIC PANEL - Abnormal; Notable for the following components:      Result Value   Glucose, Bld 204 (*)    Total Bilirubin 1.6 (*)    GFR calc non Af Amer 55 (*)    All other components within normal limits  BRAIN NATRIURETIC PEPTIDE - Abnormal; Notable for the following components:   B Natriuretic Peptide 202.7 (*)    All other components within normal limits  CBC WITH DIFFERENTIAL/PLATELET - Abnormal; Notable for the following components:   WBC 18.0 (*)    MCV 101.3 (*)    Neutro Abs 15.3 (*)  Monocytes Absolute 1.1 (*)    Abs Immature Granulocytes 0.10 (*)    All other components within normal limits  CBG MONITORING, ED - Abnormal; Notable for the following components:   Glucose-Capillary 193 (*)    All other components within normal limits  MAGNESIUM  TROPONIN I  PROTIME-INR    EKG EKG Interpretation  Date/Time:  Tuesday August 13 2018 14:18:38 EST Ventricular Rate:  113 PR Interval:    QRS Duration: 128 QT Interval:  351 QTC Calculation: 482 R Axis:   -43 Text Interpretation:  Sinus tachycardia Left bundle branch block Left axis deviation Abnormal ekg Confirmed by Carmin Muskrat [phone]) on 08/13/2018 3:56:20 PM   Radiology Dg Chest Portable 1 View  Result Date: 08/13/2018 CLINICAL DATA:  Short of breath, diabetes, smoking history EXAM: PORTABLE CHEST 1 VIEW COMPARISON:  Chest x-ray of 09/05/2017 FINDINGS: No active infiltrate or effusion is seen. The lungs do appear to be slightly hyperaerated. Mediastinal and hilar contours are unremarkable. The heart is mildly enlarged and stable. No bony abnormality is seen. IMPRESSION: 1. No change in slight hyper aeration.  No active lung disease. 2. Stable mild cardiomegaly. Electronically Signed   By: Ivar Drape M.D.   On: 08/13/2018 14:34    Procedures Procedures (including critical care time)  Medications Ordered in ED Medications  morphine 4 MG/ML injection  4 mg (4 mg Intravenous Not Given 08/13/18 1519)  ipratropium-albuterol (DUONEB) 0.5-2.5 (3) MG/3ML nebulizer solution 3 mL (has no administration in time range)  ketorolac (TORADOL) 30 MG/ML injection 15 mg (has no administration in time range)  albuterol (PROVENTIL) (2.5 MG/3ML) 0.083% nebulizer solution 5 mg (5 mg Nebulization Given 08/13/18 1520)  methylPREDNISolone sodium succinate (SOLU-MEDROL) 125 mg/2 mL injection 125 mg (125 mg Intravenous Given 08/13/18 1519)     Initial Impression / Assessment and Plan / ED Course  I have reviewed the triage vital signs and the nursing notes.  Pertinent labs & imaging results that were available during my care of the patient were reviewed by me and considered in my medical decision making (see chart for details).     Following initial evaluation the patient received albuterol, steroids, and on repeat exam she appears better, heart rate has diminished, respiratory rate has diminished. Initial troponin 0 Suspicion for COPD exacerbation, but given the patient's description of new pain, serial troponins indicated, and these values will be monitored by Dr. Tomi Bamberger. Patient aware of results thus far, plan for discharge with ongoing bronchodilators at home with reassuring second troponin result.  There is mild leukocytosis, and slight bnp elevation, but patient is afebrile and has no overt evidence of heart failure.  Final Clinical Impressions(s) / ED Diagnoses  Dyspnea Atypical chest pain   Carmin Muskrat, MD 08/13/18 1620

## 2018-08-13 NOTE — ED Triage Notes (Signed)
Pt arrives with complaints of left side CP that radiates to her back that began this AM. States it hurts to breath. Smokes a pack a day.

## 2018-08-13 NOTE — Discharge Instructions (Addendum)
Continue your breathing medications, follow up with your primary care doctor

## 2018-08-21 ENCOUNTER — Telehealth: Payer: Self-pay | Admitting: Cardiovascular Disease

## 2018-08-21 ENCOUNTER — Ambulatory Visit (INDEPENDENT_AMBULATORY_CARE_PROVIDER_SITE_OTHER): Payer: Medicare Other | Admitting: Family Medicine

## 2018-08-21 ENCOUNTER — Encounter: Payer: Self-pay | Admitting: Family Medicine

## 2018-08-21 VITALS — BP 110/66 | HR 112 | Temp 97.2°F | Ht 61.0 in | Wt 122.6 lb

## 2018-08-21 DIAGNOSIS — R0602 Shortness of breath: Secondary | ICD-10-CM | POA: Diagnosis not present

## 2018-08-21 DIAGNOSIS — R079 Chest pain, unspecified: Secondary | ICD-10-CM | POA: Diagnosis not present

## 2018-08-21 DIAGNOSIS — J441 Chronic obstructive pulmonary disease with (acute) exacerbation: Secondary | ICD-10-CM | POA: Diagnosis not present

## 2018-08-21 MED ORDER — FLUTICASONE-SALMETEROL 100-50 MCG/DOSE IN AEPB: 1.0000 | INHALATION_SPRAY | Freq: Two times a day (BID) | RESPIRATORY_TRACT | 3 refills | Status: DC

## 2018-08-21 NOTE — Telephone Encounter (Signed)
Hannah French states Dr. Warrick Parisian instructed her to see Dr. Burt Knack ASAP because her HR has been elevated. It was 112 in the office today and he did not change any medications.  Scheduled her Monday, Dec 2 at noon. She was grateful for call and agrees with treatment plan.

## 2018-08-21 NOTE — Progress Notes (Signed)
BP 110/66   Pulse (!) 112   Temp (!) 97.2 F (36.2 C) (Oral)   Ht 5\' 1"  (1.549 m)   Wt 122 lb 9.6 oz (55.6 kg)   BMI 23.17 kg/m    Subjective:    Patient ID: Hannah French, female    DOB: [birth year], 77 y.o.   MRN: [id number]  HPI: Hannah French is a 77 y.o. female presenting on 08/21/2018 for Hospitalization Follow-up (11/19 AP chest pain)   HPI ER follow-up for chest pain shortness of breath Patient is coming in today for ER follow-up for chest pain shortness of breath.  She was in the ER on 11/19 at any [facility] with the chest pain and the shortness of breath.  She says she noticed it just that morning and she felt like she was wheezing more but went in because she was concerned about her heart because of the chest pain.  She had EKGs and troponins and saw a cardiologist in consult while in the emergency department and they said that it was not related to her heart and likely related to her COPD and give her an antibiotic and some steroids.  She says she is feeling better although she still feels like she is more short of breath on exertion than she normally is and that she gets fatigued easier than she normally is.  She also feels like she is still getting flutters in her heart and chest more than she normally does.  She does have a cardiologist and Dr. Burt Knack and she has not given them a call but will give them a call and make an appointment ASAP to go see them.  She does admit that she has been out of her Advair and has not been taking it like she normally should  Relevant past medical, surgical, family and social history reviewed and updated as indicated. Interim medical history since our last visit reviewed. Allergies and medications reviewed and updated.  Review of Systems  Constitutional: Negative for chills and fever.  HENT: Positive for congestion. Negative for ear discharge, ear pain, postnasal drip, rhinorrhea, sinus pressure, sneezing and sore throat.     Eyes: Negative for pain, redness and visual disturbance.  Respiratory: Positive for cough, chest tightness, shortness of breath and wheezing.   Cardiovascular: Positive for chest pain. Negative for leg swelling.  Genitourinary: Negative for difficulty urinating and dysuria.  Musculoskeletal: Negative for back pain and gait problem.  Skin: Negative for rash.  Neurological: Negative for light-headedness and headaches.  Psychiatric/Behavioral: Negative for agitation and behavioral problems.  All other systems reviewed and are negative.   Per HPI unless specifically indicated above   Allergies as of 08/21/2018      Reactions   Contrast Media [iodinated Diagnostic Agents] Shortness Of Breath   Iohexol Other (See Comments)   PASSED OUT DURING THE TEST   Metformin Swelling   Face and hands became swollen   Pravastatin Swelling, Other (See Comments)   Myalgia, also   Sulfamethoxazole-trimethoprim Nausea And Vomiting   Panceatitis, also   Albuterol Other (See Comments)   "Takes the skin off of the inside of my mouth"   Cefdinir Other (See Comments)   Pancreatitis   Nsaids Other (See Comments)   GI intolerance and pain all over   Statins    They make the patient hurt "all over"   Sulfa Antibiotics Other (See Comments)   Pancreatitis   Rofecoxib Other (See Comments)   "VIOXX"- "SHUT DOWN  MY KIDNEYS"      Medication List        Accurate as of 08/21/18 11:52 AM. Always use your most recent med list.          CALCIUM 1200+D3 PO Take 1 tablet by mouth daily.   cholecalciferol 1000 units tablet Commonly known as:  VITAMIN D Take 1,000 Units by mouth daily.   cyclobenzaprine 5 MG tablet Commonly known as:  FLEXERIL Take 1 tablet (5 mg total) by mouth 3 (three) times daily as needed for muscle spasms.   Fluticasone-Salmeterol 100-50 MCG/DOSE Aepb Commonly known as:  ADVAIR Inhale 1 puff into the lungs 2 (two) times daily.   furosemide 20 MG tablet Commonly known as:   LASIX Take 1 tablet (20 mg total) by mouth as needed.   glimepiride 4 MG tablet Commonly known as:  AMARYL TAKE (1) TABLET DAILY AS DIRECTED.   ibuprofen 200 MG tablet Commonly known as:  ADVIL,MOTRIN Take 200-400 mg by mouth every 6 (six) hours as needed (for pain).   metoprolol succinate 25 MG 24 hr tablet Commonly known as:  TOPROL-XL Take 1 tablet (25 mg total) by mouth daily.   ONETOUCH DELICA LANCETS 40C Misc USE   TO CHECK GLUCOSE TWICE DAILY AND AS NEEDED   ONETOUCH VERIO test strip Generic drug:  glucose blood USE TO CHECK BLOOD GLUCOSE TWICE DAILY AND AS NEEDED   VITAMIN B COMPLEX PO Take 1 tablet by mouth daily.          Objective:    BP 110/66   Pulse (!) 112   Temp (!) 97.2 F (36.2 C) (Oral)   Ht 5\' 1"  (1.549 m)   Wt 122 lb 9.6 oz (55.6 kg)   BMI 23.17 kg/m   Wt Readings from Last 3 Encounters:  08/21/18 122 lb 9.6 oz (55.6 kg)  08/13/18 121 lb (54.9 kg)  05/31/18 118 lb 3.2 oz (53.6 kg)    Physical Exam  Constitutional: She is oriented to person, place, and time. She appears well-developed and well-nourished. No distress.  Eyes: Conjunctivae are normal.  Neck: Normal range of motion. Neck supple. No thyromegaly present.  Cardiovascular: Normal rate, regular rhythm and intact distal pulses.  Murmur (Systolic murmur heard greatest at left upper sternal border) heard. Pulmonary/Chest: Effort normal. No stridor. No respiratory distress. She has wheezes. She has no rales. She exhibits no tenderness.  Lymphadenopathy:    She has no cervical adenopathy.  Neurological: She is alert and oriented to person, place, and time. Coordination normal.  Skin: Skin is warm and dry. No rash noted. She is not diaphoretic.  Psychiatric: She has a normal mood and affect. Her behavior is normal.  Nursing note and vitals reviewed.       Assessment & Plan:   Problem List Items Addressed This Visit    None    Visit Diagnoses    Chest pain, unspecified type    -   Primary   Relevant Medications   Fluticasone-Salmeterol (ADVAIR) 100-50 MCG/DOSE AEPB   Shortness of breath       Relevant Medications   Fluticasone-Salmeterol (ADVAIR) 100-50 MCG/DOSE AEPB   COPD exacerbation ([facility])       Relevant Medications   Fluticasone-Salmeterol (ADVAIR) 100-50 MCG/DOSE AEPB      Gave sample of Humalog to help her with her sugars being up from steroid shot that she had at the hospital. Follow up plan: Return if symptoms worsen or fail to improve.  Counseling provided for all  of the vaccine components No orders of the defined types were placed in this encounter.   Caryl Pina, MD [facility] 08/21/2018, 11:52 AM

## 2018-08-21 NOTE — Telephone Encounter (Signed)
  Patient was told by Dr. Warrick Parisian to make an appt with Dr Burt Knack. Patient refused appt with PA and I told her I could put her on the wait list and patient requested a call back from [facility]

## 2018-08-26 ENCOUNTER — Ambulatory Visit: Payer: Medicare Other | Admitting: Cardiovascular Disease

## 2018-08-26 ENCOUNTER — Encounter: Payer: Self-pay | Admitting: Cardiovascular Disease

## 2018-08-26 VITALS — BP 120/80 | HR 104 | Ht 61.0 in | Wt 122.0 lb

## 2018-08-26 DIAGNOSIS — R0602 Shortness of breath: Secondary | ICD-10-CM | POA: Diagnosis not present

## 2018-08-26 DIAGNOSIS — I739 Peripheral vascular disease, unspecified: Secondary | ICD-10-CM | POA: Diagnosis not present

## 2018-08-26 DIAGNOSIS — I447 Left bundle-branch block, unspecified: Secondary | ICD-10-CM | POA: Diagnosis not present

## 2018-08-26 MED ORDER — METOPROLOL SUCCINATE ER 25 MG PO TB24: 37.5000 mg | ORAL_TABLET | Freq: Every day | ORAL | 3 refills | Status: DC

## 2018-08-26 NOTE — H&P (View-Only) (Signed)
Cardiology Office Note:    Date:  08/26/2018   ID:  Hannah French, DOB Dec 01, 1940, MRN [id number]  PCP:  Dettinger, Fransisca Kaufmann, MD  Cardiologist:  No primary care provider on file.  Electrophysiologist:  None   Referring MD: Dettinger, Fransisca Kaufmann, MD   Chief Complaint  Patient presents with  . Shortness of Breath   History of Present Illness:    Hannah French is a 77 y.o. female with a hx of peripheral arterial disease, presenting for follow-up evaluation.  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  Her peripheral arterial disease has been managed medically considering lack of symptoms.  She is a long-standing smoker with type 2 diabetes.  ABIs have been normal on noninvasive testing.  She was evaluated in the ER recently and felt to have a COPD exacerbation treated with a course of antibiotics and a single dose of steroids. She had left sided atypical chest pain at the time and serial troponins were negative.   She is here alone today. Feeling a little better but still with sinus congestion. No further chest pain. She continues to have cough and dyspnea but approaching her baseline. No edema/othopnea/PND. Still smoking 1/2 ppd.   Past Medical History:  Diagnosis Date  . Abdominal bruit   . Bladder cancer ([place])   . Carotid bruit   . Diabetes mellitus    Type II  . Dyslipidemia   . Heart murmur   . Mitral valve prolapse   . Osteoporosis   . Pancreatitis, acute   . Pseudogout   . PVD (peripheral vascular disease) ([place])     Past Surgical History:  Procedure Laterality Date  . APPENDECTOMY    . Hysterectomy-type unspecified    . LAPAROSCOPIC CHOLECYSTECTOMY  2009    Current Medications: Current Meds  Medication Sig  . B Complex Vitamins (VITAMIN B COMPLEX PO) Take 1 tablet by mouth daily.   . Calcium-Magnesium-Vitamin D (CALCIUM 1200+D3 PO) Take 1 tablet by mouth daily.   . cholecalciferol (VITAMIN D) 1000 units tablet  Take 1,000 Units by mouth daily.  . cyclobenzaprine (FLEXERIL) 5 MG tablet Take 1 tablet (5 mg total) by mouth 3 (three) times daily as needed for muscle spasms.  . Fluticasone-Salmeterol (ADVAIR) 100-50 MCG/DOSE AEPB Inhale 1 puff into the lungs 2 (two) times daily.  . furosemide (LASIX) 20 MG tablet Take 1 tablet (20 mg total) by mouth as needed.  Marland Kitchen glimepiride (AMARYL) 4 MG tablet TAKE (1) TABLET DAILY AS DIRECTED.  Marland Kitchen ibuprofen (ADVIL,MOTRIN) 200 MG tablet Take 200-400 mg by mouth every 6 (six) hours as needed (for pain).  . metoprolol succinate (TOPROL-XL) 25 MG 24 hr tablet Take 1.5 tablets (37.5 mg total) by mouth daily.  Glory Rosebush DELICA LANCETS 82N MISC USE   TO CHECK GLUCOSE TWICE DAILY AND AS NEEDED  . ONETOUCH VERIO test strip USE TO CHECK BLOOD GLUCOSE TWICE DAILY AND AS NEEDED  . [DISCONTINUED] metoprolol succinate (TOPROL-XL) 25 MG 24 hr tablet Take 1 tablet (25 mg total) by mouth daily.     Allergies:   Contrast media [iodinated diagnostic agents]; Iohexol; Metformin; Pravastatin; Sulfamethoxazole-trimethoprim; Albuterol; Cefdinir; Nsaids; Statins; Sulfa antibiotics; and Rofecoxib   Social History   Socioeconomic History  . Marital status: Divorced    Spouse name: Not on file  . Number of children: 2  . Years of education: Not on file  . Highest education level: Not on file  Occupational History  .  Occupation: Retired  Scientific laboratory technician  . Financial resource strain: Not on file  . Food insecurity:    Worry: Not on file    Inability: Not on file  . Transportation needs:    Medical: Not on file    Non-medical: Not on file  Tobacco Use  . Smoking status: Current Every Day Smoker    Packs/day: 1.00    Years: 30.00    Pack years: 30.00    Types: Cigarettes  . Smokeless tobacco: Never Used  Substance and Sexual Activity  . Alcohol use: No  . Drug use: No  . Sexual activity: Not on file  Lifestyle  . Physical activity:    Days per week: Not on file    Minutes per  session: Not on file  . Stress: Not on file  Relationships  . Social connections:    Talks on phone: Not on file    Gets together: Not on file    Attends religious service: Not on file    Active member of club or organization: Not on file    Attends meetings of clubs or organizations: Not on file    Relationship status: Not on file  Other Topics Concern  . Not on file  Social History Narrative  . Not on file     Family History: [name] family history includes Heart attack in her brother and maternal uncle; Heart failure in her mother; Hypertension in her brother and sister. There is no history of Coronary artery disease.  ROS:   Please see the history of present illness.    Positive for leg swelling, hearing loss, dyspnea, excessive fatigue, and skipped heart beats. All other systems reviewed and are negative.  EKGs/Labs/Other Studies Reviewed:    The following studies were reviewed today: CXR 08-20-2018: FINDINGS: No active infiltrate or effusion is seen. The lungs do appear to be slightly hyperaerated. Mediastinal and hilar contours are unremarkable. The heart is mildly enlarged and stable. No bony abnormality is seen.  IMPRESSION: 1. No change in slight hyper aeration.  No active lung disease. 2. Stable mild cardiomegaly.  EKG:  EKG is ordered today.  The ekg ordered today demonstrates sinus tachycardia 104 bpm, LBBB pattern  Recent Labs: 08-20-18: ALT 15; B Natriuretic Peptide 202.7; BUN 15; Creatinine, Ser 0.97; Hemoglobin 12.5; Magnesium 1.7; Platelets 276; Potassium 3.9; Sodium 136  Recent Lipid Panel    Component Value Date/Time   CHOL 170 03/20/2018 0837   TRIG 88 03/20/2018 0837   TRIG 82 06/09/2014 1028   HDL 47 03/20/2018 0837   HDL 57 06/09/2014 1028   CHOLHDL 3.6 03/20/2018 0837   CHOLHDL 3.5 02/06/2013 0932   VLDL 20 02/06/2013 0932   LDLCALC 105 (H) 03/20/2018 0837   LDLCALC 131 (H) 06/09/2014 1028    Physical Exam:    VS:  BP 120/80    Pulse (!) 104   Ht 5\' 1"  (1.549 m)   Wt 122 lb (55.3 kg)   SpO2 92%   BMI 23.05 kg/m     Wt Readings from Last 3 Encounters:  08/26/18 122 lb (55.3 kg)  08/21/18 122 lb 9.6 oz (55.6 kg)  Aug 20, 2018 121 lb (54.9 kg)     GEN:  Well nourished, well developed in no acute distress HEENT: Normal NECK: No JVD; No carotid bruits LYMPHATICS: No lymphadenopathy CARDIAC: RRR, no murmurs, rubs, gallops RESPIRATORY:  Clear to auscultation without rales, wheezing or rhonchi  ABDOMEN: Soft, non-tender, non-distended MUSCULOSKELETAL:  No edema; No deformity  SKIN: Warm and dry NEUROLOGIC:  Alert and oriented x 3 PSYCHIATRIC:  Normal affect   ASSESSMENT:    1. Shortness of breath   2. LBBB (left bundle branch block)   3. PVD (peripheral vascular disease) ([place])    PLAN:    In order of problems listed above:  1. Likely related to chronic tobacco, URI, ongoing smoking. However, will check a 2D echo to evaluate LV function, valvular disease, right-sided function. With her persistent tachycardia, will increase Toprol XL to 37.5 mg daily. 2. Previous IVCD, now meets criteria for LBBB. Check echo. 3. Stable symptoms. Tobacco cessation counseling done. Noninvasive studies reviewed - normal ABI's and bilateral subclavian stenosis noted. All stable and patient symptom-free. Continue medical therapy.  Medication Adjustments/Labs and Tests Ordered: Current medicines are reviewed at length with the patient today.  Concerns regarding medicines are outlined above.  Orders Placed This Encounter  Procedures  . EKG 12-Lead  . ECHOCARDIOGRAM COMPLETE   Meds ordered this encounter  Medications  . metoprolol succinate (TOPROL-XL) 25 MG 24 hr tablet    Sig: Take 1.5 tablets (37.5 mg total) by mouth daily.    Dispense:  135 tablet    Refill:  3    Patient Instructions  Medication Instructions:  1) INCREASE TOPROL to 37.5 mg daily  Labwork: None  Testing/Procedures: Your provider has requested  that you have an echocardiogram. Echocardiography is a painless test that uses sound waves to create images of your heart. It provides your doctor with information about the size and shape of your heart and how well your heart's chambers and valves are working. This procedure takes approximately one hour. There are no restrictions for this procedure.  Follow-Up: Dr. Burt Knack recommends you have an appointment with his assistant in 6 months.  Your provider wants you to follow-up in: 1 year with Dr. Burt Knack. You will receive a reminder letter in the mail two months in advance. If you don't receive a letter, please call our office to schedule the follow-up appointment.    Any Other Special Instructions Will Be Listed Below (If Applicable).     If you need a refill on your cardiac medications before your next appointment, please call your pharmacy.      Signed, Sherren Mocha, MD  08/26/2018 1:47 PM    [facility]

## 2018-08-26 NOTE — Patient Instructions (Signed)
Medication Instructions:  1) INCREASE TOPROL to 37.5 mg daily  Labwork: None  Testing/Procedures: Your provider has requested that you have an echocardiogram. Echocardiography is a painless test that uses sound waves to create images of your heart. It provides your doctor with information about the size and shape of your heart and how well your heart's chambers and valves are working. This procedure takes approximately one hour. There are no restrictions for this procedure.  Follow-Up: Dr. Burt Knack recommends you have an appointment with his assistant in 6 months.  Your provider wants you to follow-up in: 1 year with Dr. Burt Knack. You will receive a reminder letter in the mail two months in advance. If you don't receive a letter, please call our office to schedule the follow-up appointment.    Any Other Special Instructions Will Be Listed Below (If Applicable).     If you need a refill on your cardiac medications before your next appointment, please call your pharmacy.

## 2018-08-26 NOTE — Progress Notes (Addendum)
Cardiology Office Note:    Date:  08/26/2018   ID:  [name], DOB 1941/08/18, MRN [id number]  PCP:  Dettinger, Fransisca Kaufmann, MD  Cardiologist:  No primary care provider on file.  Electrophysiologist:  None   Referring MD: Dettinger, Fransisca Kaufmann, MD   Chief Complaint  Patient presents with  . Shortness of Breath   History of Present Illness:    Hannah French is a 77 y.o. female with a hx of peripheral arterial disease, presenting for follow-up evaluation.  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  Her peripheral arterial disease has been managed medically considering lack of symptoms.  She is a long-standing smoker with type 2 diabetes.  ABIs have been normal on noninvasive testing.  She was evaluated in the ER recently and felt to have a COPD exacerbation treated with a course of antibiotics and a single dose of steroids. She had left sided atypical chest pain at the time and serial troponins were negative.   She is here alone today. Feeling a little better but still with sinus congestion. No further chest pain. She continues to have cough and dyspnea but approaching her baseline. No edema/othopnea/PND. Still smoking 1/2 ppd.   Past Medical History:  Diagnosis Date  . Abdominal bruit   . Bladder cancer ([place])   . Carotid bruit   . Diabetes mellitus    Type II  . Dyslipidemia   . Heart murmur   . Mitral valve prolapse   . Osteoporosis   . Pancreatitis, acute   . Pseudogout   . PVD (peripheral vascular disease) ([place])     Past Surgical History:  Procedure Laterality Date  . APPENDECTOMY    . Hysterectomy-type unspecified    . LAPAROSCOPIC CHOLECYSTECTOMY  2009    Current Medications: Current Meds  Medication Sig  . B Complex Vitamins (VITAMIN B COMPLEX PO) Take 1 tablet by mouth daily.   . Calcium-Magnesium-Vitamin D (CALCIUM 1200+D3 PO) Take 1 tablet by mouth daily.   . cholecalciferol (VITAMIN D) 1000 units tablet  Take 1,000 Units by mouth daily.  . cyclobenzaprine (FLEXERIL) 5 MG tablet Take 1 tablet (5 mg total) by mouth 3 (three) times daily as needed for muscle spasms.  . Fluticasone-Salmeterol (ADVAIR) 100-50 MCG/DOSE AEPB Inhale 1 puff into the lungs 2 (two) times daily.  . furosemide (LASIX) 20 MG tablet Take 1 tablet (20 mg total) by mouth as needed.  Marland Kitchen glimepiride (AMARYL) 4 MG tablet TAKE (1) TABLET DAILY AS DIRECTED.  Marland Kitchen ibuprofen (ADVIL,MOTRIN) 200 MG tablet Take 200-400 mg by mouth every 6 (six) hours as needed (for pain).  . metoprolol succinate (TOPROL-XL) 25 MG 24 hr tablet Take 1.5 tablets (37.5 mg total) by mouth daily.  Glory Rosebush DELICA LANCETS 80D MISC USE   TO CHECK GLUCOSE TWICE DAILY AND AS NEEDED  . ONETOUCH VERIO test strip USE TO CHECK BLOOD GLUCOSE TWICE DAILY AND AS NEEDED  . [DISCONTINUED] metoprolol succinate (TOPROL-XL) 25 MG 24 hr tablet Take 1 tablet (25 mg total) by mouth daily.     Allergies:   Contrast media [iodinated diagnostic agents]; Iohexol; Metformin; Pravastatin; Sulfamethoxazole-trimethoprim; Albuterol; Cefdinir; Nsaids; Statins; Sulfa antibiotics; and Rofecoxib   Social History   Socioeconomic History  . Marital status: Divorced    Spouse name: Not on file  . Number of children: 2  . Years of education: Not on file  . Highest education level: Not on file  Occupational History  .  Occupation: Retired  Scientific laboratory technician  . Financial resource strain: Not on file  . Food insecurity:    Worry: Not on file    Inability: Not on file  . Transportation needs:    Medical: Not on file    Non-medical: Not on file  Tobacco Use  . Smoking status: Current Every Day Smoker    Packs/day: 1.00    Years: 30.00    Pack years: 30.00    Types: Cigarettes  . Smokeless tobacco: Never Used  Substance and Sexual Activity  . Alcohol use: No  . Drug use: No  . Sexual activity: Not on file  Lifestyle  . Physical activity:    Days per week: Not on file    Minutes per  session: Not on file  . Stress: Not on file  Relationships  . Social connections:    Talks on phone: Not on file    Gets together: Not on file    Attends religious service: Not on file    Active member of club or organization: Not on file    Attends meetings of clubs or organizations: Not on file    Relationship status: Not on file  Other Topics Concern  . Not on file  Social History Narrative  . Not on file     Family History: [name] family history includes Heart attack in her brother and maternal uncle; Heart failure in her mother; Hypertension in her brother and sister. There is no history of Coronary artery disease.  ROS:   Please see the history of present illness.    Positive for leg swelling, hearing loss, dyspnea, excessive fatigue, and skipped heart beats. All other systems reviewed and are negative.  EKGs/Labs/Other Studies Reviewed:    The following studies were reviewed today: CXR August 21, 2018: FINDINGS: No active infiltrate or effusion is seen. The lungs do appear to be slightly hyperaerated. Mediastinal and hilar contours are unremarkable. The heart is mildly enlarged and stable. No bony abnormality is seen.  IMPRESSION: 1. No change in slight hyper aeration.  No active lung disease. 2. Stable mild cardiomegaly.  EKG:  EKG is ordered today.  The ekg ordered today demonstrates sinus tachycardia 104 bpm, LBBB pattern  Recent Labs: 2018-08-21: ALT 15; B Natriuretic Peptide 202.7; BUN 15; Creatinine, Ser 0.97; Hemoglobin 12.5; Magnesium 1.7; Platelets 276; Potassium 3.9; Sodium 136  Recent Lipid Panel    Component Value Date/Time   CHOL 170 03/20/2018 0837   TRIG 88 03/20/2018 0837   TRIG 82 06/09/2014 1028   HDL 47 03/20/2018 0837   HDL 57 06/09/2014 1028   CHOLHDL 3.6 03/20/2018 0837   CHOLHDL 3.5 02/06/2013 0932   VLDL 20 02/06/2013 0932   LDLCALC 105 (H) 03/20/2018 0837   LDLCALC 131 (H) 06/09/2014 1028    Physical Exam:    VS:  BP 120/80    Pulse (!) 104   Ht 5\' 1"  (1.549 m)   Wt 122 lb (55.3 kg)   SpO2 92%   BMI 23.05 kg/m     Wt Readings from Last 3 Encounters:  08/26/18 122 lb (55.3 kg)  08/21/18 122 lb 9.6 oz (55.6 kg)  08/21/18 121 lb (54.9 kg)     GEN:  Well nourished, well developed in no acute distress HEENT: Normal NECK: No JVD; No carotid bruits LYMPHATICS: No lymphadenopathy CARDIAC: RRR, no murmurs, rubs, gallops RESPIRATORY:  Clear to auscultation without rales, wheezing or rhonchi  ABDOMEN: Soft, non-tender, non-distended MUSCULOSKELETAL:  No edema; No deformity  SKIN: Warm and dry NEUROLOGIC:  Alert and oriented x 3 PSYCHIATRIC:  Normal affect   ASSESSMENT:    1. Shortness of breath   2. LBBB (left bundle branch block)   3. PVD (peripheral vascular disease) ([place])    PLAN:    In order of problems listed above:  1. Likely related to chronic tobacco, URI, ongoing smoking. However, will check a 2D echo to evaluate LV function, valvular disease, right-sided function. With her persistent tachycardia, will increase Toprol XL to 37.5 mg daily. 2. Previous IVCD, now meets criteria for LBBB. Check echo. 3. Stable symptoms. Tobacco cessation counseling done. Noninvasive studies reviewed - normal ABI's and bilateral subclavian stenosis noted. All stable and patient symptom-free. Continue medical therapy.  Medication Adjustments/Labs and Tests Ordered: Current medicines are reviewed at length with the patient today.  Concerns regarding medicines are outlined above.  Orders Placed This Encounter  Procedures  . EKG 12-Lead  . ECHOCARDIOGRAM COMPLETE   Meds ordered this encounter  Medications  . metoprolol succinate (TOPROL-XL) 25 MG 24 hr tablet    Sig: Take 1.5 tablets (37.5 mg total) by mouth daily.    Dispense:  135 tablet    Refill:  3    Patient Instructions  Medication Instructions:  1) INCREASE TOPROL to 37.5 mg daily  Labwork: None  Testing/Procedures: Your provider has requested  that you have an echocardiogram. Echocardiography is a painless test that uses sound waves to create images of your heart. It provides your doctor with information about the size and shape of your heart and how well your heart's chambers and valves are working. This procedure takes approximately one hour. There are no restrictions for this procedure.  Follow-Up: Dr. Burt Knack recommends you have an appointment with his assistant in 6 months.  Your provider wants you to follow-up in: 1 year with Dr. Burt Knack. You will receive a reminder letter in the mail two months in advance. If you don't receive a letter, please call our office to schedule the follow-up appointment.    Any Other Special Instructions Will Be Listed Below (If Applicable).     If you need a refill on your cardiac medications before your next appointment, please call your pharmacy.      Signed, Sherren Mocha, MD  08/26/2018 1:47 PM    [facility]

## 2018-08-28 ENCOUNTER — Other Ambulatory Visit: Payer: Self-pay

## 2018-08-28 ENCOUNTER — Ambulatory Visit (HOSPITAL_COMMUNITY): Payer: Medicare Other | Attending: Cardiology

## 2018-08-28 DIAGNOSIS — R0602 Shortness of breath: Secondary | ICD-10-CM | POA: Diagnosis not present

## 2018-09-03 ENCOUNTER — Ambulatory Visit (INDEPENDENT_AMBULATORY_CARE_PROVIDER_SITE_OTHER): Payer: Medicare Other

## 2018-09-03 ENCOUNTER — Encounter: Payer: Self-pay | Admitting: Family Medicine

## 2018-09-03 ENCOUNTER — Ambulatory Visit (INDEPENDENT_AMBULATORY_CARE_PROVIDER_SITE_OTHER): Payer: Medicare Other | Admitting: Family Medicine

## 2018-09-03 VITALS — BP 114/71 | HR 101 | Temp 97.7°F | Ht 61.0 in | Wt 123.6 lb

## 2018-09-03 DIAGNOSIS — J449 Chronic obstructive pulmonary disease, unspecified: Secondary | ICD-10-CM | POA: Diagnosis not present

## 2018-09-03 DIAGNOSIS — J441 Chronic obstructive pulmonary disease with (acute) exacerbation: Secondary | ICD-10-CM | POA: Diagnosis not present

## 2018-09-03 MED ORDER — AMOXICILLIN-POT CLAVULANATE 875-125 MG PO TABS: 1.0000 | ORAL_TABLET | Freq: Two times a day (BID) | ORAL | 0 refills | Status: DC

## 2018-09-03 MED ORDER — PREDNISONE 20 MG PO TABS: ORAL_TABLET | ORAL | 0 refills | Status: DC

## 2018-09-03 NOTE — Progress Notes (Signed)
BP 114/71   Pulse (!) 101   Temp 97.7 F (36.5 C) (Oral)   Ht 5\' 1"  (1.549 m)   Wt 123 lb 9.6 oz (56.1 kg)   BMI 23.35 kg/m    Subjective:    Patient ID: Hannah French, female    DOB: 06-22-1941, 77 y.o.   MRN: [id number]  HPI: Hannah French is a 77 y.o. female presenting on 09/03/2018 for chest x ray (Before she gets a heart cath)   HPI Difficulty breathing and wheezing Patient is still having some difficulty breathing and wheezing after her recent visit and treated for possible pneumonia.  Patient still has some of the coughing and wheezing and small amount of shortness of breath.  Patient is intended to get a heart catheterization but cardiology wanted her to come in and get a recheck with an x-ray to see if she had any pneumonia in her lungs or what was still causing her issues currently.  Relevant past medical, surgical, family and social history reviewed and updated as indicated. Interim medical history since our last visit reviewed. Allergies and medications reviewed and updated.  Review of Systems  Constitutional: Negative for chills and fever.  HENT: Positive for congestion. Negative for ear discharge, ear pain, postnasal drip, rhinorrhea, sinus pressure, sneezing and sore throat.   Eyes: Negative for pain, redness and visual disturbance.  Respiratory: Positive for cough, shortness of breath and wheezing. Negative for chest tightness.   Cardiovascular: Negative for chest pain and leg swelling.  Genitourinary: Negative for difficulty urinating and dysuria.  Musculoskeletal: Negative for back pain and gait problem.  Skin: Negative for rash.  Neurological: Negative for light-headedness and headaches.  Psychiatric/Behavioral: Negative for agitation and behavioral problems.  All other systems reviewed and are negative.   Per HPI unless specifically indicated above   Allergies as of 09/03/2018      Reactions   Contrast Media [iodinated Diagnostic Agents] Shortness  Of Breath   Iohexol Other (See Comments)   PASSED OUT DURING THE TEST   Metformin Swelling   Face and hands became swollen   Pravastatin Swelling, Other (See Comments)   Myalgia, also   Sulfamethoxazole-trimethoprim Nausea And Vomiting   Panceatitis, also   Albuterol Other (See Comments)   "Takes the skin off of the inside of my mouth"   Cefdinir Other (See Comments)   Pancreatitis   Nsaids Other (See Comments)   GI intolerance and pain all over   Statins    They make the patient hurt "all over"   Sulfa Antibiotics Other (See Comments)   Pancreatitis   Rofecoxib Other (See Comments)   "VIOXX"- "SHUT DOWN MY KIDNEYS"      Medication List        Accurate as of 09/03/18  1:57 PM. Always use your most recent med list.          CALCIUM 1200+D3 PO Take 1 tablet by mouth daily.   cholecalciferol 1000 units tablet Commonly known as:  VITAMIN D Take 1,000 Units by mouth daily.   cyclobenzaprine 5 MG tablet Commonly known as:  FLEXERIL Take 1 tablet (5 mg total) by mouth 3 (three) times daily as needed for muscle spasms.   Fluticasone-Salmeterol 100-50 MCG/DOSE Aepb Commonly known as:  ADVAIR Inhale 1 puff into the lungs 2 (two) times daily.   furosemide 20 MG tablet Commonly known as:  LASIX Take 1 tablet (20 mg total) by mouth as needed.   glimepiride 4 MG tablet Commonly known  as:  AMARYL TAKE (1) TABLET DAILY AS DIRECTED.   ibuprofen 200 MG tablet Commonly known as:  ADVIL,MOTRIN Take 200-400 mg by mouth every 6 (six) hours as needed (for pain).   metoprolol succinate 25 MG 24 hr tablet Commonly known as:  TOPROL-XL Take 1.5 tablets (37.5 mg total) by mouth daily.   ONETOUCH DELICA LANCETS 37T Misc USE   TO CHECK GLUCOSE TWICE DAILY AND AS NEEDED   ONETOUCH VERIO test strip Generic drug:  glucose blood USE TO CHECK BLOOD GLUCOSE TWICE DAILY AND AS NEEDED   VITAMIN B COMPLEX PO Take 1 tablet by mouth daily.          Objective:    BP 114/71    Pulse (!) 101   Temp 97.7 F (36.5 C) (Oral)   Ht 5\' 1"  (1.549 m)   Wt 123 lb 9.6 oz (56.1 kg)   BMI 23.35 kg/m   Wt Readings from Last 3 Encounters:  09/03/18 123 lb 9.6 oz (56.1 kg)  08/26/18 122 lb (55.3 kg)  08/21/18 122 lb 9.6 oz (55.6 kg)    Physical Exam Vitals signs reviewed.  Constitutional:      General: She is not in acute distress.    Appearance: She is well-developed. She is not diaphoretic.  HENT:     Right Ear: Tympanic membrane, ear canal and external ear normal.     Left Ear: Tympanic membrane, ear canal and external ear normal.     Nose: No mucosal edema or rhinorrhea.     Right Sinus: No maxillary sinus tenderness or frontal sinus tenderness.     Left Sinus: No maxillary sinus tenderness or frontal sinus tenderness.     Mouth/Throat:     Pharynx: Uvula midline. No oropharyngeal exudate or posterior oropharyngeal erythema.     Tonsils: No tonsillar abscesses.  Eyes:     Conjunctiva/sclera: Conjunctivae normal.  Cardiovascular:     Rate and Rhythm: Normal rate and regular rhythm.     Heart sounds: Normal heart sounds. No murmur.  Pulmonary:     Effort: Pulmonary effort is normal. No respiratory distress.     Breath sounds: No stridor. Wheezing (Mild end expiratory wheezes in upper lobes) present. No rhonchi or rales.  Musculoskeletal: Normal range of motion.        General: No tenderness.  Skin:    General: Skin is warm and dry.     Findings: No rash.  Neurological:     Mental Status: She is alert and oriented to person, place, and time.     Coordination: Coordination normal.  Psychiatric:        Behavior: Behavior normal.     Chest x-ray: No acute cardiopulmonary abnormality noted on x-ray, await final read from radiology    Assessment & Plan:   Problem List Items Addressed This Visit    None    Visit Diagnoses    COPD exacerbation ([place])    -  Primary   Relevant Medications   amoxicillin-clavulanate (AUGMENTIN) 875-125 MG tablet   predniSONE  (DELTASONE) 20 MG tablet   Other Relevant Orders   DG Chest 2 View (Completed)      Will treat like COPD exacerbation with Augmentin and steroids. Follow up plan: Return if symptoms worsen or fail to improve.  Counseling provided for all of the vaccine components Orders Placed This Encounter  Procedures  . DG Chest 2 View    Caryl Pina, MD [facility] 09/03/2018, 1:57 PM

## 2018-09-04 ENCOUNTER — Telehealth: Payer: Self-pay

## 2018-09-04 DIAGNOSIS — I447 Left bundle-branch block, unspecified: Secondary | ICD-10-CM

## 2018-09-04 MED ORDER — PREDNISONE 50 MG PO TABS: ORAL_TABLET | ORAL | 0 refills | Status: DC

## 2018-09-04 NOTE — Telephone Encounter (Signed)
-----   Message from Sherren Mocha, MD sent at 09/02/2018  1:59 PM EST ----- Reviewed echo findings with the patient.  I recommended right and left heart catheterization.  I have reviewed the risks, indications, and alternatives with the patient.  She understands the risks include kidney injury, MI, stroke, vascular injury, arrhythmia, radiation injury, emergency surgery, and death.  She is agreeable to proceed.  She still feels like she is dealing with an upper respiratory infection and she is going to touch base with her primary care physician this week.

## 2018-09-04 NOTE — Telephone Encounter (Signed)
The patient reports she feels much better today. She went to her PCP yesterday and was given a 5 day prednisone pack and 7 days of abx. She was told she will feel "just fine" when those are completed.   Scheduled patient for [facility] Monday, September 16, 2018 at 0900 (arrival time 0700). She will come for labs and to pick up her instruction letter Monday, December 16. Confirmed with her she does have contrast allergy and she is NOT allergic to ASA. Reviewed contrast allergy instructions with her per protocol. She was grateful for call and agrees with treatment plan.

## 2018-09-09 ENCOUNTER — Other Ambulatory Visit: Payer: Medicare Other | Admitting: *Deleted

## 2018-09-09 DIAGNOSIS — I447 Left bundle-branch block, unspecified: Secondary | ICD-10-CM

## 2018-09-10 LAB — CBC WITH DIFFERENTIAL/PLATELET
Basophils Absolute: 0.1 10*3/uL (ref 0.0–0.2)
Basos: 1 %
EOS (ABSOLUTE): 0.1 10*3/uL (ref 0.0–0.4)
Eos: 1 %
Hematocrit: 34.1 % (ref 34.0–46.6)
Hemoglobin: 11.3 g/dL (ref 11.1–15.9)
Immature Grans (Abs): 0.1 10*3/uL (ref 0.0–0.1)
Immature Granulocytes: 1 %
Lymphocytes Absolute: 3.1 10*3/uL (ref 0.7–3.1)
Lymphs: 33 %
MCH: 32.6 pg (ref 26.6–33.0)
MCHC: 33.1 g/dL (ref 31.5–35.7)
MCV: 98 fL — ABNORMAL HIGH (ref 79–97)
Monocytes Absolute: 0.5 10*3/uL (ref 0.1–0.9)
Monocytes: 6 %
NRBC: 1 % — ABNORMAL HIGH (ref 0–0)
Neutrophils Absolute: 5.4 10*3/uL (ref 1.4–7.0)
Neutrophils: 58 %
Platelets: 310 10*3/uL (ref 150–450)
RBC: 3.47 x10E6/uL — ABNORMAL LOW (ref 3.77–5.28)
RDW: 13.9 % (ref 12.3–15.4)
WBC: 9.3 10*3/uL (ref 3.4–10.8)

## 2018-09-10 LAB — BASIC METABOLIC PANEL
BUN/Creatinine Ratio: 21 (ref 12–28)
BUN: 24 mg/dL (ref 8–27)
CO2: 25 mmol/L (ref 20–29)
Calcium: 9.3 mg/dL (ref 8.7–10.3)
Chloride: 96 mmol/L (ref 96–106)
Creatinine, Ser: 1.13 mg/dL — ABNORMAL HIGH (ref 0.57–1.00)
GFR calc Af Amer: 54 mL/min/{1.73_m2} — ABNORMAL LOW (ref >59)
GFR calc non Af Amer: 47 mL/min/{1.73_m2} — ABNORMAL LOW (ref >59)
Glucose: 303 mg/dL — ABNORMAL HIGH (ref 65–99)
Potassium: 3.6 mmol/L (ref 3.5–5.2)
Sodium: 139 mmol/L (ref 134–144)

## 2018-09-12 ENCOUNTER — Telehealth: Payer: Self-pay | Admitting: *Deleted

## 2018-09-12 NOTE — Telephone Encounter (Signed)
Pt contacted pre-catheterization scheduled at [facility] for: September 16, 2018 9 AM Verified arrival time and place: [place] Entrance A at: 7 AM  No solid food after midnight prior to cath, clear liquids until 5 AM day of procedure.  Contrast allergy: yes-reviewed instructions for 13 hour prednisone and benadryl prep: Prednisone 50 mg 12/221/9 8 PM Prednisone 50 mg 09/16/18 2 AM Prednisone 50 mg and Benadryl 50 mg just prior to leaving for hospital AM of procedure.  Hold: Furosemide-day before and day of procedure. Glimepiride -AM of procedure. NSAIDS-day before and day of procedure.  Except hold medications AM meds can be  taken pre-cath with sip of water including: ASA 81 mg  Confirmed patient has responsible person to drive home post procedure and for 24 hours after you arrive home: yes

## 2018-09-16 ENCOUNTER — Encounter (HOSPITAL_COMMUNITY)
Admission: RE | Disposition: A | Discharge status: Home / Self Care | Payer: Self-pay | Source: Home / Self Care | Attending: Cardiovascular Disease

## 2018-09-16 ENCOUNTER — Ambulatory Visit (HOSPITAL_COMMUNITY)
Admission: RE | Admit: 2018-09-16 | Discharge: 2018-09-16 | Disposition: A | Discharge status: Home / Self Care | Payer: Medicare Other | Attending: Cardiovascular Disease | Admitting: Cardiovascular Disease

## 2018-09-16 ENCOUNTER — Other Ambulatory Visit: Payer: Self-pay

## 2018-09-16 ENCOUNTER — Encounter (HOSPITAL_COMMUNITY): Payer: Self-pay | Admitting: Cardiovascular Disease

## 2018-09-16 DIAGNOSIS — Z90710 Acquired absence of both cervix and uterus: Secondary | ICD-10-CM | POA: Diagnosis not present

## 2018-09-16 DIAGNOSIS — Z79899 Other long term (current) drug therapy: Secondary | ICD-10-CM | POA: Diagnosis not present

## 2018-09-16 DIAGNOSIS — I5021 Acute systolic (congestive) heart failure: Secondary | ICD-10-CM | POA: Diagnosis present

## 2018-09-16 DIAGNOSIS — I272 Pulmonary hypertension, unspecified: Secondary | ICD-10-CM | POA: Insufficient documentation

## 2018-09-16 DIAGNOSIS — M810 Age-related osteoporosis without current pathological fracture: Secondary | ICD-10-CM | POA: Insufficient documentation

## 2018-09-16 DIAGNOSIS — I447 Left bundle-branch block, unspecified: Secondary | ICD-10-CM | POA: Insufficient documentation

## 2018-09-16 DIAGNOSIS — Z8249 Family history of ischemic heart disease and other diseases of the circulatory system: Secondary | ICD-10-CM | POA: Insufficient documentation

## 2018-09-16 DIAGNOSIS — E1151 Type 2 diabetes mellitus with diabetic peripheral angiopathy without gangrene: Secondary | ICD-10-CM | POA: Diagnosis not present

## 2018-09-16 DIAGNOSIS — Z881 Allergy status to other antibiotic agents status: Secondary | ICD-10-CM | POA: Diagnosis not present

## 2018-09-16 DIAGNOSIS — I2510 Atherosclerotic heart disease of native coronary artery without angina pectoris: Secondary | ICD-10-CM | POA: Diagnosis not present

## 2018-09-16 DIAGNOSIS — E785 Hyperlipidemia, unspecified: Secondary | ICD-10-CM | POA: Diagnosis not present

## 2018-09-16 DIAGNOSIS — Z888 Allergy status to other drugs, medicaments and biological substances status: Secondary | ICD-10-CM | POA: Insufficient documentation

## 2018-09-16 DIAGNOSIS — F17210 Nicotine dependence, cigarettes, uncomplicated: Secondary | ICD-10-CM | POA: Diagnosis not present

## 2018-09-16 DIAGNOSIS — Z882 Allergy status to sulfonamides status: Secondary | ICD-10-CM | POA: Insufficient documentation

## 2018-09-16 DIAGNOSIS — I058 Other rheumatic mitral valve diseases: Secondary | ICD-10-CM | POA: Insufficient documentation

## 2018-09-16 DIAGNOSIS — Z886 Allergy status to analgesic agent status: Secondary | ICD-10-CM | POA: Diagnosis not present

## 2018-09-16 DIAGNOSIS — Z91041 Radiographic dye allergy status: Secondary | ICD-10-CM | POA: Insufficient documentation

## 2018-09-16 DIAGNOSIS — Z7984 Long term (current) use of oral hypoglycemic drugs: Secondary | ICD-10-CM | POA: Diagnosis not present

## 2018-09-16 HISTORY — PX: RIGHT/LEFT HEART CATH AND CORONARY ANGIOGRAPHY: CATH118266

## 2018-09-16 LAB — POCT I-STAT 3, VENOUS BLOOD GAS (G3P V)
Acid-base deficit: 1 mmol/L (ref 0.0–2.0)
Bicarbonate: 25.5 mmol/L (ref 20.0–28.0)
O2 Saturation: 72 %
TCO2: 27 mmol/L (ref 22–32)
pCO2, Ven: 48.1 mmHg (ref 44.0–60.0)
pH, Ven: 7.332 (ref 7.250–7.430)
pO2, Ven: 41 mmHg (ref 32.0–45.0)

## 2018-09-16 LAB — GLUCOSE, CAPILLARY: Glucose-Capillary: 332 mg/dL — ABNORMAL HIGH (ref 70–99)

## 2018-09-16 LAB — POCT I-STAT 3, ART BLOOD GAS (G3+)
Acid-base deficit: 1 mmol/L (ref 0.0–2.0)
Bicarbonate: 25.5 mmol/L (ref 20.0–28.0)
O2 Saturation: 97 %
TCO2: 27 mmol/L (ref 22–32)
pCO2 arterial: 47.8 mmHg (ref 32.0–48.0)
pH, Arterial: 7.334 — ABNORMAL LOW (ref 7.350–7.450)
pO2, Arterial: 102 mmHg (ref 83.0–108.0)

## 2018-09-16 SURGERY — RIGHT/LEFT HEART CATH AND CORONARY ANGIOGRAPHY
Anesthesia: LOCAL

## 2018-09-16 MED ORDER — SODIUM CHLORIDE 0.9 % IV SOLN: 250.0000 mL | INTRAVENOUS | Status: DC | PRN

## 2018-09-16 MED ORDER — FENTANYL CITRATE (PF) 100 MCG/2ML IJ SOLN
INTRAMUSCULAR | Status: AC
  Filled 2018-09-16: qty 2

## 2018-09-16 MED ORDER — VERAPAMIL HCL 2.5 MG/ML IV SOLN
INTRAVENOUS | Status: DC | PRN
  Administered 2018-09-16: 10 mL via INTRA_ARTERIAL

## 2018-09-16 MED ORDER — LIDOCAINE HCL (PF) 1 % IJ SOLN
INTRAMUSCULAR | Status: AC
  Filled 2018-09-16: qty 30

## 2018-09-16 MED ORDER — SODIUM CHLORIDE 0.9 % IV SOLN: INTRAVENOUS | Status: DC

## 2018-09-16 MED ORDER — ASPIRIN 81 MG PO CHEW: 81.0000 mg | CHEWABLE_TABLET | ORAL | Status: DC

## 2018-09-16 MED ORDER — MIDAZOLAM HCL 2 MG/2ML IJ SOLN
INTRAMUSCULAR | Status: AC
  Filled 2018-09-16: qty 2

## 2018-09-16 MED ORDER — ACETAMINOPHEN 325 MG PO TABS: 650.0000 mg | ORAL_TABLET | ORAL | Status: DC | PRN

## 2018-09-16 MED ORDER — HEPARIN (PORCINE) IN NACL 1000-0.9 UT/500ML-% IV SOLN
INTRAVENOUS | Status: DC | PRN
  Administered 2018-09-16: 500 mL

## 2018-09-16 MED ORDER — VERAPAMIL HCL 2.5 MG/ML IV SOLN
INTRAVENOUS | Status: AC
  Filled 2018-09-16: qty 2

## 2018-09-16 MED ORDER — ONDANSETRON HCL 4 MG/2ML IJ SOLN: 4.0000 mg | Freq: Four times a day (QID) | INTRAMUSCULAR | Status: DC | PRN

## 2018-09-16 MED ORDER — HEPARIN (PORCINE) IN NACL 1000-0.9 UT/500ML-% IV SOLN
INTRAVENOUS | Status: AC
  Filled 2018-09-16: qty 1000

## 2018-09-16 MED ORDER — SODIUM CHLORIDE 0.9% FLUSH: 3.0000 mL | INTRAVENOUS | Status: DC | PRN

## 2018-09-16 MED ORDER — SODIUM CHLORIDE 0.9% FLUSH: 3.0000 mL | Freq: Two times a day (BID) | INTRAVENOUS | Status: DC

## 2018-09-16 MED ORDER — FENTANYL CITRATE (PF) 100 MCG/2ML IJ SOLN
INTRAMUSCULAR | Status: DC | PRN
  Administered 2018-09-16: 25 ug via INTRAVENOUS

## 2018-09-16 MED ORDER — MIDAZOLAM HCL 2 MG/2ML IJ SOLN
INTRAMUSCULAR | Status: DC | PRN
  Administered 2018-09-16: 1 mg via INTRAVENOUS

## 2018-09-16 MED ORDER — LOSARTAN POTASSIUM 25 MG PO TABS: 25.0000 mg | ORAL_TABLET | Freq: Every day | ORAL | 11 refills | Status: DC

## 2018-09-16 MED ORDER — LIDOCAINE HCL (PF) 1 % IJ SOLN
INTRAMUSCULAR | Status: DC | PRN
  Administered 2018-09-16: 1 mL
  Administered 2018-09-16: 2 mL

## 2018-09-16 MED ORDER — IOHEXOL 350 MG/ML SOLN
INTRAVENOUS | Status: DC | PRN
  Administered 2018-09-16: 25 mL via INTRA_ARTERIAL

## 2018-09-16 MED ORDER — HEPARIN SODIUM (PORCINE) 1000 UNIT/ML IJ SOLN
INTRAMUSCULAR | Status: DC | PRN
  Administered 2018-09-16: 3000 [IU] via INTRAVENOUS

## 2018-09-16 SURGICAL SUPPLY — 12 items
CATH 5FR JL3.5 JR4 ANG PIG MP (CATHETERS) ×2 IMPLANT
CATH BALLN WEDGE 5F 110CM (CATHETERS) ×2 IMPLANT
DEVICE RAD COMP TR BAND LRG (VASCULAR PRODUCTS) ×2 IMPLANT
GLIDESHEATH SLEND SS 6F .021 (SHEATH) IMPLANT
GUIDEWIRE INQWIRE 1.5J.035X260 (WIRE) ×1 IMPLANT
INQWIRE 1.5J .035X260CM (WIRE) ×2
KIT HEART LEFT (KITS) ×2 IMPLANT
PACK CARDIAC CATHETERIZATION (CUSTOM PROCEDURE TRAY) ×2 IMPLANT
SHEATH GLIDE SLENDER 4/5FR (SHEATH) ×4 IMPLANT
TRANSDUCER W/STOPCOCK (MISCELLANEOUS) ×2 IMPLANT
TUBING CIL FLEX 10 FLL-RA (TUBING) ×2 IMPLANT
WIRE HI TORQ VERSACORE-J 145CM (WIRE) ×2 IMPLANT

## 2018-09-16 NOTE — Interval H&P Note (Signed)
History and Physical Interval Note:  09/16/2018 9:04 AM  [name]  has presented today for surgery, with the diagnosis of lv disfunction - new bundle branch  The various methods of treatment have been discussed with the patient and family. After consideration of risks, benefits and other options for treatment, the patient has consented to  Procedure(s): RIGHT/LEFT HEART CATH AND CORONARY ANGIOGRAPHY (N/A) as a surgical intervention .  [name] history has been reviewed, patient examined, no change in status, stable for surgery.  I have reviewed the patient's chart and labs.  Questions were answered to the patient's satisfaction.    Pt with new CHF, severe LV dysfunction. Discussed echo findings with her and ordered R/L heart cath to assess new cardiomyopathy.  Hannah French

## 2018-09-16 NOTE — Progress Notes (Signed)
Inpatient Diabetes Program Recommendations  AACE/ADA: New Consensus Statement on Inpatient Glycemic Control (2015)  Target Ranges:  Prepandial:   less than 140 mg/dL      Peak postprandial:   less than 180 mg/dL (1-2 hours)      Critically ill patients:  140 - 180 mg/dL   Lab Results  Component Value Date   GLUCAP 332 (H) 09/16/2018   HGBA1C 9.3 (H) 03/20/2018    Review of Glycemic Control Results for Hannah French, Hannah French (MRN [id number]) as of 09/16/2018 12:12  Ref. Range 09/16/2018 07:05  Glucose-Capillary Latest Ref Range: 70 - 99 mg/dL 332 (H)   Diabetes history: Type 2 DM Outpatient Diabetes medications: Amaryl 4 mg QD (when on steroids only) Current orders for Inpatient glycemic control: none  Inpatient Diabetes Program Recommendations:    Noted admit CBG of 332 mg/dL.   Of note, patient was seen by PCP on 05/31/18 and has had steroid injections to both knees following above A1C, thus current A1C may be higher. Consider repeating A1C.   Additionally, Patient has been on [place] in the past. Consider Levemir 8 units QD and Novolog 0-9 units TID.  Thanks, Bronson Curb, MSN, RNC-OB Diabetes Coordinator [phone] (8a-5p)

## 2018-09-16 NOTE — Discharge Instructions (Signed)
Radial Site Care ° °This sheet gives you information about how to care for yourself after your procedure. Your health care provider may also give you more specific instructions. If you have problems or questions, contact your health care provider. °What can I expect after the procedure? °After the procedure, it is common to have: °· Bruising and tenderness at the catheter insertion area. °Follow these instructions at home: °Medicines °· Take over-the-counter and prescription medicines only as told by your health care provider. °Insertion site care °· Follow instructions from your health care provider about how to take care of your insertion site. Make sure you: °? Wash your hands with soap and water before you change your bandage (dressing). If soap and water are not available, use hand sanitizer. °? Change your dressing as told by your health care provider. °? Leave stitches (sutures), skin glue, or adhesive strips in place. These skin closures may need to stay in place for 2 weeks or longer. If adhesive strip edges start to loosen and curl up, you may trim the loose edges. Do not remove adhesive strips completely unless your health care provider tells you to do that. °· Check your insertion site every day for signs of infection. Check for: °? Redness, swelling, or pain. °? Fluid or blood. °? Pus or a bad smell. °? Warmth. °· Do not take baths, swim, or use a hot tub until your health care provider approves. °· You may shower 24-48 hours after the procedure, or as directed by your health care provider. °? Remove the dressing and gently wash the site with plain soap and water. °? Pat the area dry with a clean towel. °? Do not rub the site. That could cause bleeding. °· Do not apply powder or lotion to the site. °Activity ° °· For 24 hours after the procedure, or as directed by your health care provider: °? Do not flex or bend the affected arm. °? Do not push or pull heavy objects with the affected arm. °? Do not  drive yourself home from the hospital or clinic. You may drive 24 hours after the procedure unless your health care provider tells you not to. °? Do not operate machinery or power tools. °· Do not lift anything that is heavier than 10 lb (4.5 kg), or the limit that you are told, until your health care provider says that it is safe. °· Ask your health care provider when it is okay to: °? Return to work or school. °? Resume usual physical activities or sports. °? Resume sexual activity. °General instructions °· If the catheter site starts to bleed, raise your arm and put firm pressure on the site. If the bleeding does not stop, get help right away. This is a medical emergency. °· If you went home on the same day as your procedure, a responsible adult should be with you for the first 24 hours after you arrive home. °· Keep all follow-up visits as told by your health care provider. This is important. °Contact a health care provider if: °· You have a fever. °· You have redness, swelling, or yellow drainage around your insertion site. °Get help right away if: °· You have unusual pain at the radial site. °· The catheter insertion area swells very fast. °· The insertion area is bleeding, and the bleeding does not stop when you hold steady pressure on the area. °· Your arm or hand becomes pale, cool, tingly, or numb. °These symptoms may represent a serious problem   that is an emergency. Do not wait to see if the symptoms will go away. Get medical help right away. Call your local emergency services (911 in the [place] Do not drive yourself to the hospital. °Summary °· After the procedure, it is common to have bruising and tenderness at the site. °· Follow instructions from your health care provider about how to take care of your radial site wound. Check the wound every day for signs of infection. °· Do not lift anything that is heavier than 10 lb (4.5 kg), or the limit that you are told, until your health care provider says  that it is safe. °This information is not intended to replace advice given to you by your health care provider. Make sure you discuss any questions you have with your health care provider. °Document Released: 10/14/2010 Document Revised: 10/17/2017 Document Reviewed: 10/17/2017 °Elsevier Interactive Patient Education © 2019 [employer] ° °

## 2018-09-19 ENCOUNTER — Telehealth: Payer: Self-pay | Admitting: Cardiovascular Disease

## 2018-09-19 NOTE — Telephone Encounter (Signed)
Spoke with patient who states Dr. Burt Knack called in Losartan and she does not need BP medication. She states she recently had to decrease her dose of Toprol due to low BP. I reviewed Dr. Antionette Char notes from cardiac cath on 12/23 and advised that he wants her to start this low dose of Losartan to help with her heart function. She verbalized understanding.  I advised her to start the medication and monitor BP, to stay well hydrated and to call with questions or concerns regarding low BP or other concerns. She states she has a BP cuff at home and that if she experiences problems over the weekend she will stop the medication and call back Monday to report. She thanked me for the call.

## 2018-09-19 NOTE — Telephone Encounter (Signed)
New message:   Patient calling concerning a surgery she had last Monday. Patient would lkie some one to call her back.

## 2018-09-20 NOTE — Telephone Encounter (Signed)
thx

## 2018-09-26 ENCOUNTER — Telehealth: Payer: Self-pay

## 2018-09-26 DIAGNOSIS — R0602 Shortness of breath: Secondary | ICD-10-CM

## 2018-09-26 NOTE — Telephone Encounter (Signed)
-----   Message from Sherren Mocha, MD sent at 09/16/2018  4:50 PM EST ----- Marykay Lex - can you arrange an APP visit sometime after the first of the year. She needs a BMET in 2 weeks I started her on losartan.  Thx!

## 2018-09-26 NOTE — Telephone Encounter (Addendum)
Scheduled patient 10/02/18 with Buren Kos. The patient understands she will have BMET drawn. She was grateful for call and agrees with treatment plan.

## 2018-09-27 ENCOUNTER — Other Ambulatory Visit: Payer: Self-pay | Admitting: Family Medicine

## 2018-09-27 MED ORDER — FUROSEMIDE 20 MG PO TABS: 20.0000 mg | ORAL_TABLET | Freq: Every day | ORAL | 0 refills | Status: DC | PRN

## 2018-09-27 NOTE — Telephone Encounter (Signed)
Last RF 08/30/16 Please advise

## 2018-09-27 NOTE — Telephone Encounter (Signed)
What is the name of the medication? furosemide (LASIX) 20 MG tablet  Have you contacted your pharmacy to request a refill? No     Which pharmacy would you like this sent to? [facility]    Patient notified that their request is being sent to the clinical staff for review and that they should receive a call once it is complete. If they do not receive a call within 24 hours they can check with their pharmacy or our office.

## 2018-10-02 ENCOUNTER — Ambulatory Visit: Payer: Medicare Other | Admitting: Cardiology

## 2018-10-02 ENCOUNTER — Other Ambulatory Visit: Payer: Medicare Other | Admitting: *Deleted

## 2018-10-02 ENCOUNTER — Other Ambulatory Visit: Payer: Medicare Other

## 2018-10-02 DIAGNOSIS — R0602 Shortness of breath: Secondary | ICD-10-CM | POA: Diagnosis not present

## 2018-10-02 DIAGNOSIS — M25561 Pain in right knee: Secondary | ICD-10-CM | POA: Diagnosis not present

## 2018-10-02 DIAGNOSIS — M79641 Pain in right hand: Secondary | ICD-10-CM | POA: Insufficient documentation

## 2018-10-02 DIAGNOSIS — M25562 Pain in left knee: Secondary | ICD-10-CM | POA: Diagnosis not present

## 2018-10-03 LAB — BASIC METABOLIC PANEL
BUN/Creatinine Ratio: 22 (ref 12–28)
BUN: 20 mg/dL (ref 8–27)
CO2: 23 mmol/L (ref 20–29)
Calcium: 9.1 mg/dL (ref 8.7–10.3)
Chloride: 100 mmol/L (ref 96–106)
Creatinine, Ser: 0.9 mg/dL (ref 0.57–1.00)
GFR calc Af Amer: 71 mL/min/{1.73_m2} (ref >59)
GFR calc non Af Amer: 61 mL/min/{1.73_m2} (ref >59)
Glucose: 283 mg/dL — ABNORMAL HIGH (ref 65–99)
Potassium: 4.4 mmol/L (ref 3.5–5.2)
Sodium: 138 mmol/L (ref 134–144)

## 2018-10-04 ENCOUNTER — Telehealth: Payer: Self-pay | Admitting: Family Medicine

## 2018-10-04 ENCOUNTER — Encounter: Payer: Self-pay | Admitting: Cardiology

## 2018-10-04 ENCOUNTER — Ambulatory Visit: Payer: Medicare Other | Admitting: Cardiology

## 2018-10-04 ENCOUNTER — Other Ambulatory Visit: Payer: Medicare Other

## 2018-10-04 ENCOUNTER — Other Ambulatory Visit (INDEPENDENT_AMBULATORY_CARE_PROVIDER_SITE_OTHER): Payer: Medicare Other

## 2018-10-04 VITALS — BP 112/70 | HR 99 | Ht 61.0 in | Wt 124.8 lb

## 2018-10-04 DIAGNOSIS — R0602 Shortness of breath: Secondary | ICD-10-CM

## 2018-10-04 DIAGNOSIS — R000 Tachycardia, unspecified: Secondary | ICD-10-CM | POA: Diagnosis not present

## 2018-10-04 DIAGNOSIS — I5021 Acute systolic (congestive) heart failure: Secondary | ICD-10-CM

## 2018-10-04 DIAGNOSIS — J439 Emphysema, unspecified: Secondary | ICD-10-CM | POA: Diagnosis not present

## 2018-10-04 DIAGNOSIS — I25119 Atherosclerotic heart disease of native coronary artery with unspecified angina pectoris: Secondary | ICD-10-CM | POA: Insufficient documentation

## 2018-10-04 DIAGNOSIS — E785 Hyperlipidemia, unspecified: Secondary | ICD-10-CM | POA: Insufficient documentation

## 2018-10-04 DIAGNOSIS — Z720 Tobacco use: Secondary | ICD-10-CM

## 2018-10-04 DIAGNOSIS — I2510 Atherosclerotic heart disease of native coronary artery without angina pectoris: Secondary | ICD-10-CM | POA: Insufficient documentation

## 2018-10-04 MED ORDER — FUROSEMIDE 20 MG PO TABS: 20.0000 mg | ORAL_TABLET | Freq: Every day | ORAL | 3 refills | Status: DC

## 2018-10-04 NOTE — Progress Notes (Signed)
Cardiology Office Note:    Date:  10/04/2018   ID:  Hannah French, DOB 1940-11-28, MRN [id number]  PCP:  Dettinger, Fransisca Kaufmann, MD  Cardiologist:  Sherren Mocha, MD  Referring MD: Dettinger, Fransisca Kaufmann, MD   Chief Complaint  Patient presents with  . Follow-up    CHF    History of Present Illness:    Hannah French is a 78 y.o. 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  Her PAD has been managed medically considering lack of symptoms.  She is a longstanding smoker with COPD and has type 2 diabetes.    She was seen in the office on 08/26/2018 by Dr. Burt Knack and was noted to have shortness of breath felt to be related to COPD and URI.  She did have a progression on her EKG from previous IVCD to left bundle branch block.  An echo was done which showed a decline in LV function with EF 30-35% with diffuse hypokinesis, prior was normal.  With new LBBB significant change in LV function she was arranged for right and left heart cath which was done on 09/16/2018 which showed nonobstructive CAD with diffuse luminal irregularities involving the left main, left circumflex and RCA.  There was 40-50% calcific stenosis of the proximal LAD.  There was mild pulmonary hypertension and mild elevation in LVEDP with preserved cardiac output.  She was recommended for medical therapy for CHF and nonobstructive CAD.  Losartan 25 mg was added with planned basic metabolic panel to follow-up renal function.  [name] today for hospital follow-up after her cath with her daughter. Right wrist cath site healed. She is having no chest pain/pressure. She has been mildly short of breath today and has a cough since yesterday. She has had mild DOE on and off over the last few weeks. She had LE edema  R>L. She has been taking her prn furosemide daily for a week and edema is better, only trace today. She denies orthopnea or PND. No fever/chills.   She  continues to smoke ~1 PPD.  BMet on 10/02/18 showed improved renal function with SCr 0.90. Her blood sugar was elevated due to having steroid injections in her wrist and knee.   Past Medical History:  Diagnosis Date  . Abdominal bruit   . Bladder cancer ([place])   . Carotid bruit   . Diabetes mellitus    Type II  . Dyslipidemia   . Heart murmur   . Mitral valve prolapse   . Osteoporosis   . Pancreatitis, acute   . Pseudogout   . PVD (peripheral vascular disease) ([place])     Past Surgical History:  Procedure Laterality Date  . APPENDECTOMY    . Hysterectomy-type unspecified    . LAPAROSCOPIC CHOLECYSTECTOMY  2009  . RIGHT/LEFT HEART CATH AND CORONARY ANGIOGRAPHY N/A 09/16/2018   Procedure: RIGHT/LEFT HEART CATH AND CORONARY ANGIOGRAPHY;  Surgeon: Sherren Mocha, MD;  Location: [facility];  Service: Cardiovascular;  Laterality: N/A;    Current Medications: Current Meds  Medication Sig  . Calcium-Vitamin D-Vitamin K (CALCIUM + D + K PO) Take 1 tablet by mouth every other day.  . Cholecalciferol (VITAMIN D) 50 MCG (2000 UT) tablet Take 6,000 Units by mouth daily.  . Fluticasone-Salmeterol (ADVAIR) 100-50 MCG/DOSE AEPB Inhale 1 puff into the lungs 2 (two) times daily.  Marland Kitchen glimepiride (AMARYL) 4 MG tablet TAKE (1) TABLET DAILY AS DIRECTED.  Marland Kitchen ibuprofen (  ADVIL,MOTRIN) 200 MG tablet Take 400 mg by mouth 2 (two) times daily as needed (for pain).   Marland Kitchen losartan (COZAAR) 25 MG tablet Take 1 tablet (25 mg total) by mouth daily.  . metoprolol succinate (TOPROL-XL) 25 MG 24 hr tablet Take 25 mg by mouth daily.  Glory Rosebush DELICA LANCETS 20N MISC USE   TO CHECK GLUCOSE TWICE DAILY AND AS NEEDED  . ONETOUCH VERIO test strip USE TO CHECK BLOOD GLUCOSE TWICE DAILY AND AS NEEDED  . vitamin B-12 (CYANOCOBALAMIN) 1000 MCG tablet Take 1,000 mcg by mouth daily.     Allergies:   Contrast media [iodinated diagnostic agents]; Iohexol; Metformin; Pravastatin; Albuterol; Bextra [valdecoxib]; Cefdinir;  Nsaids; Statins; Sulfa antibiotics; Zetia [ezetimibe]; and Rofecoxib   Social History   Socioeconomic History  . Marital status: Divorced    Spouse name: Not on file  . Number of children: 2  . Years of education: Not on file  . Highest education level: Not on file  Occupational History  . Occupation: Retired  Scientific laboratory technician  . Financial resource strain: Not on file  . Food insecurity:    Worry: Not on file    Inability: Not on file  . Transportation needs:    Medical: Not on file    Non-medical: Not on file  Tobacco Use  . Smoking status: Current Every Day Smoker    Packs/day: 1.00    Years: 30.00    Pack years: 30.00    Types: Cigarettes  . Smokeless tobacco: Never Used  Substance and Sexual Activity  . Alcohol use: No  . Drug use: No  . Sexual activity: Not on file  Lifestyle  . Physical activity:    Days per week: Not on file    Minutes per session: Not on file  . Stress: Not on file  Relationships  . Social connections:    Talks on phone: Not on file    Gets together: Not on file    Attends religious service: Not on file    Active member of club or organization: Not on file    Attends meetings of clubs or organizations: Not on file    Relationship status: Not on file  Other Topics Concern  . Not on file  Social History Narrative  . Not on file     Family History: [name] family history includes Heart attack in her brother and maternal uncle; Heart failure in her mother; Hypertension in her brother and sister. There is no history of Coronary artery disease. ROS:   Please see the history of present illness.     All other systems reviewed and are negative.  EKGs/Labs/Other Studies Reviewed:    The following studies were reviewed today:  RIGHT/LEFT HEART CATH AND CORONARY ANGIOGRAPHY 09/16/18  Conclusion    Prox LAD lesion is 40% stenosed.   1.  Nonobstructive coronary artery disease with diffuse luminal irregularities involving the left main,  left circumflex, and RCA.  40 to 50% calcific stenosis of the proximal LAD is noted. 2.  Known severe LV systolic dysfunction by echo assessment 3.  Mild pulmonary hypertension and mild elevation of LVEDP 4.  Preserved cardiac output  Recommend: Medical therapy for congestive heart failure and nonobstructive CAD.  Will add losartan 25 mg to her medical regimen.  Will need to follow renal function closely and will draw metabolic panel in about 2 weeks.   Echocardiogram 08/28/2018 Study Conclusions - Left ventricle: The cavity size was normal. There was mild  concentric hypertrophy. Systolic function was moderately to   severely reduced. The estimated ejection fraction was in the   range of 30% to 35%. Diffuse hypokinesis. Features are consistent   with a pseudonormal left ventricular filling pattern, with   concomitant abnormal relaxation and increased filling pressure   (grade 2 diastolic dysfunction). Doppler parameters are   consistent with elevated ventricular end-diastolic filling   pressure. - Ventricular septum: Septal motion showed paradox. - Aortic valve: There was no regurgitation. - Mitral valve: There was mild regurgitation. - Right ventricle: The cavity size was normal. Wall thickness was   normal. Systolic function was normal. - Tricuspid valve: There was mild regurgitation. - Pericardium, extracardiac: There was no pericardial effusion.  Impressions: - Since the last study on 03/07/2016 there has been a significant   change, LVEF has decreased from 60-65% to 30% with diffuse   hypokinesis and paradoxical septal motion. Filling pressures are   elevated.  EKG:  EKG is not ordered today.    Recent Labs: 08/13/2018: ALT 15; B Natriuretic Peptide 202.7; Magnesium 1.7 09/09/2018: Hemoglobin 11.3; Platelets 310 10/02/2018: BUN 20; Creatinine, Ser 0.90; Potassium 4.4; Sodium 138   Recent Lipid Panel    Component Value Date/Time   CHOL 170 03/20/2018 0837   TRIG 88  03/20/2018 0837   TRIG 82 06/09/2014 1028   HDL 47 03/20/2018 0837   HDL 57 06/09/2014 1028   CHOLHDL 3.6 03/20/2018 0837   CHOLHDL 3.5 02/06/2013 0932   VLDL 20 02/06/2013 0932   LDLCALC 105 (H) 03/20/2018 0837   LDLCALC 131 (H) 06/09/2014 1028    Physical Exam:    VS:  BP 112/70   Pulse 99   Ht 5\' 1"  (1.549 m)   Wt 124 lb 12.8 oz (56.6 kg)   SpO2 95%   BMI 23.58 kg/m     Wt Readings from Last 3 Encounters:  10/04/18 124 lb 12.8 oz (56.6 kg)  09/16/18 122 lb (55.3 kg)  09/03/18 123 lb 9.6 oz (56.1 kg)     Physical Exam  Constitutional: She is oriented to person, place, and time. She appears well-developed and well-nourished. No distress.  HENT:  Head: Normocephalic and atraumatic.  Neck: Normal range of motion. Neck supple. No JVD present.  Cardiovascular: Normal rate and regular rhythm. Exam reveals no gallop and no friction rub.  No murmur heard. Pulmonary/Chest: Effort normal. No respiratory distress. She has wheezes. She has no rales.  Musical inspiratory wheezes and scattered coarse exp wheeze  Abdominal: Soft.  Musculoskeletal:        General: Edema present.     Comments: Trace lower leg edema  Neurological: She is alert and oriented to person, place, and time.  Skin: Skin is warm and dry.  Psychiatric: She has a normal mood and affect. Her behavior is normal. Judgment and thought content normal.  Vitals reviewed.  ASSESSMENT:    1. Acute systolic heart failure ([place])   2. SOB (shortness of breath)   3. Tachycardia   4. Tobacco abuse   5. Pulmonary emphysema, unspecified emphysema type ([place])    PLAN:    In order of problems listed above:  Acute systolic CHF/Nonischemic cardiomyopathy -Newly reduced EF 30-35% on echo 08/28/2018 -Right and left heart cath showed only mild-mod nonobstructive CAD,  mild pulmonary hypertension and mild elevation in LVEDP with preserved cardiac output.   -She has been started on an ARB -Has been taking her prn lasix daily  for the last week with improvement in  her LE edema. Her breathing was improved with only occ DOE, but now has had increased shortness of breath this morning and a cough that started yest. No orthopnea.  -Will continue lasix on a daily basis with instructions to take 2 for increase wt, swelling or shortness of breath.  -CHF Zones handout given. -Will check BNP. Will increase lasix if indicated.   Shortness of breath -Seemed to improve with taking lasix daily, but is worse today. Cough started yesterday. Wheezes auscultated. No fever/chills.  -With her COPD and continued smoking, concern for PNA vs CHF. Will check chest Xray. And BNP.  Tachycardia -HR elevated at last visit and today 105, came down to 99 with rest.  -BB was decreased to make room for ARB with soft BP.  -May have some bearing on her LV dysfunction.  -I will discuss increasing BB vs adding Corlanor for HR control in the setting of HF.   CAD -Mild-moderate nonobstructive CAD by cath, 40-50% calcific stenosis of the proximal LAD -Recently started on ARB for cardiomyopathy.  Beta-blocker reduced due to low blood pressure. -No chest discomfort  Tobacco abuse -Continues to smoke up to 1 PPD.  -Patient with PAD and CAD.  Strongly advised smoking cessation. She says she has never had success trying to quit and is not interested in trying again.   COPD: uses inhaler. Followed by PCP. May need a pulmonology referral at some point.   Medication Adjustments/Labs and Tests Ordered: Current medicines are reviewed at length with the patient today.  Concerns regarding medicines are outlined above. Labs and tests ordered and medication changes are outlined in the patient instructions below:  Patient Instructions  Medication Instructions:  INCREASE: Lasix to 20 mg daily ( you may take an extra dose for increase weight gain or swelling )  If you need a refill on your cardiac medications before your next appointment, please call your  pharmacy.   Lab work: TODAY: BNP  If you have labs (blood work) drawn today and your tests are completely normal, you will receive your results only by: Marland Kitchen MyChart Message (if you have MyChart) OR . A paper copy in the mail If you have any lab test that is abnormal or we need to change your treatment, we will call you to review the results.  Testing/Procedures: A chest x-ray takes a picture of the organs and structures inside the chest, including the heart, lungs, and blood vessels. This test can show several things, including, whether the heart is enlarges; whether fluid is building up in the lungs; and whether pacemaker / defibrillator leads are still in place.   Follow-Up: At [facility], you and your health needs are our priority.  As part of our continuing mission to provide you with exceptional heart care, we have created designated Provider Care Teams.  These Care Teams include your primary Cardiologist (physician) and Advanced Practice Providers (APPs -  Physician Assistants and Nurse Practitioners) who all work together to provide you with the care you need, when you need it. You will need a follow up appointment in:  3 months.  Please call our office 2 months in advance to schedule this appointment.  You may see Sherren Mocha, MD or one of the following Advanced Practice Providers on your designated Care Team: Richardson Dopp, PA-C [place], [place] . Daune Perch, NP  Any Other Special Instructions Will Be Listed Below (If Applicable).  Steps to Quit Smoking  Smoking tobacco can be bad for your health. It  can also affect almost every organ in your body. Smoking puts you and people around you at risk for many serious long-lasting (chronic) diseases. Quitting smoking is hard, but it is one of the best things that you can do for your health. It is never too late to quit. What are the benefits of quitting smoking? When you quit smoking, you lower your risk for getting serious  diseases and conditions. They can include:  Lung cancer or lung disease.  Heart disease.  Stroke.  Heart attack.  Not being able to have children (infertility).  Weak bones (osteoporosis) and broken bones (fractures). If you have coughing, wheezing, and shortness of breath, those symptoms may get better when you quit. You may also get sick less often. If you are pregnant, quitting smoking can help to lower your chances of having a baby of low birth weight. What can I do to help me quit smoking? Talk with your doctor about what can help you quit smoking. Some things you can do (strategies) include:  Quitting smoking totally, instead of slowly cutting back how much you smoke over a period of time.  Going to in-person counseling. You are more likely to quit if you go to many counseling sessions.  Using resources and support systems, such as: ? Database administrator with a Social worker. ? Phone quitlines. ? Careers information officer. ? Support groups or group counseling. ? Text messaging programs. ? Mobile phone apps or applications.  Taking medicines. Some of these medicines may have nicotine in them. If you are pregnant or breastfeeding, do not take any medicines to quit smoking unless your doctor says it is okay. Talk with your doctor about counseling or other things that can help you. Talk with your doctor about using more than one strategy at the same time, such as taking medicines while you are also going to in-person counseling. This can help make quitting easier. What things can I do to make it easier to quit? Quitting smoking might feel very hard at first, but there is a lot that you can do to make it easier. Take these steps:  Talk to your family and friends. Ask them to support and encourage you.  Call phone quitlines, reach out to support groups, or work with a Social worker.  Ask people who smoke to not smoke around you.  Avoid places that make you want (trigger) to smoke, such  as: ? Bars. ? Parties. ? Smoke-break areas at work.  Spend time with people who do not smoke.  Lower the stress in your life. Stress can make you want to smoke. Try these things to help your stress: ? Getting regular exercise. ? Deep-breathing exercises. ? Yoga. ? Meditating. ? Doing a body scan. To do this, close your eyes, focus on one area of your body at a time from head to toe, and notice which parts of your body are tense. Try to relax the muscles in those areas.  Download or buy apps on your mobile phone or tablet that can help you stick to your quit plan. There are many free apps, such as QuitGuide from the [employer] Office manager for Disease Control and Prevention). You can find more support from smokefree.gov and other websites. This information is not intended to replace advice given to you by your health care provider. Make sure you discuss any questions you have with your health care provider. Document Released: 07/08/2009 Document Revised: 05/09/2016 Document Reviewed: 01/26/2015 Elsevier Interactive Patient Education  2019 [employer].  Signed, Daune Perch, NP  10/04/2018 1:45 PM    [facility]

## 2018-10-04 NOTE — Patient Instructions (Signed)
Medication Instructions:  INCREASE: Lasix to 20 mg daily ( you may take an extra dose for increase weight gain or swelling )  If you need a refill on your cardiac medications before your next appointment, please call your pharmacy.   Lab work: TODAY: BNP  If you have labs (blood work) drawn today and your tests are completely normal, you will receive your results only by: Marland Kitchen MyChart Message (if you have MyChart) OR . A paper copy in the mail If you have any lab test that is abnormal or we need to change your treatment, we will call you to review the results.  Testing/Procedures: A chest x-ray takes a picture of the organs and structures inside the chest, including the heart, lungs, and blood vessels. This test can show several things, including, whether the heart is enlarges; whether fluid is building up in the lungs; and whether pacemaker / defibrillator leads are still in place.   Follow-Up: At [facility], you and your health needs are our priority.  As part of our continuing mission to provide you with exceptional heart care, we have created designated Provider Care Teams.  These Care Teams include your primary Cardiologist (physician) and Advanced Practice Providers (APPs -  Physician Assistants and Nurse Practitioners) who all work together to provide you with the care you need, when you need it. You will need a follow up appointment in:  3 months.  Please call our office 2 months in advance to schedule this appointment.  You may see Sherren Mocha, MD or one of the following Advanced Practice Providers on your designated Care Team: Richardson Dopp, PA-C [place], [place] . Daune Perch, NP  Any Other Special Instructions Will Be Listed Below (If Applicable).  Steps to Quit Smoking  Smoking tobacco can be bad for your health. It can also affect almost every organ in your body. Smoking puts you and people around you at risk for many serious long-lasting (chronic) diseases. Quitting  smoking is hard, but it is one of the best things that you can do for your health. It is never too late to quit. What are the benefits of quitting smoking? When you quit smoking, you lower your risk for getting serious diseases and conditions. They can include:  Lung cancer or lung disease.  Heart disease.  Stroke.  Heart attack.  Not being able to have children (infertility).  Weak bones (osteoporosis) and broken bones (fractures). If you have coughing, wheezing, and shortness of breath, those symptoms may get better when you quit. You may also get sick less often. If you are pregnant, quitting smoking can help to lower your chances of having a baby of low birth weight. What can I do to help me quit smoking? Talk with your doctor about what can help you quit smoking. Some things you can do (strategies) include:  Quitting smoking totally, instead of slowly cutting back how much you smoke over a period of time.  Going to in-person counseling. You are more likely to quit if you go to many counseling sessions.  Using resources and support systems, such as: ? Database administrator with a Social worker. ? Phone quitlines. ? Careers information officer. ? Support groups or group counseling. ? Text messaging programs. ? Mobile phone apps or applications.  Taking medicines. Some of these medicines may have nicotine in them. If you are pregnant or breastfeeding, do not take any medicines to quit smoking unless your doctor says it is okay. Talk with your doctor about  counseling or other things that can help you. Talk with your doctor about using more than one strategy at the same time, such as taking medicines while you are also going to in-person counseling. This can help make quitting easier. What things can I do to make it easier to quit? Quitting smoking might feel very hard at first, but there is a lot that you can do to make it easier. Take these steps:  Talk to your family and friends. Ask them  to support and encourage you.  Call phone quitlines, reach out to support groups, or work with a Social worker.  Ask people who smoke to not smoke around you.  Avoid places that make you want (trigger) to smoke, such as: ? Bars. ? Parties. ? Smoke-break areas at work.  Spend time with people who do not smoke.  Lower the stress in your life. Stress can make you want to smoke. Try these things to help your stress: ? Getting regular exercise. ? Deep-breathing exercises. ? Yoga. ? Meditating. ? Doing a body scan. To do this, close your eyes, focus on one area of your body at a time from head to toe, and notice which parts of your body are tense. Try to relax the muscles in those areas.  Download or buy apps on your mobile phone or tablet that can help you stick to your quit plan. There are many free apps, such as QuitGuide from the [employer] Office manager for Disease Control and Prevention). You can find more support from smokefree.gov and other websites. This information is not intended to replace advice given to you by your health care provider. Make sure you discuss any questions you have with your health care provider. Document Released: 07/08/2009 Document Revised: 05/09/2016 Document Reviewed: 01/26/2015 Elsevier Interactive Patient Education  2019 [employer].

## 2018-10-04 NOTE — Telephone Encounter (Signed)
Has already been done. Patient was in office and spoke to triage

## 2018-10-05 LAB — PRO B NATRIURETIC PEPTIDE: NT-Pro BNP: 4124 pg/mL — ABNORMAL HIGH (ref 0–738)

## 2018-10-10 ENCOUNTER — Telehealth: Payer: Self-pay | Admitting: Cardiovascular Disease

## 2018-10-10 MED ORDER — IVABRADINE HCL 5 MG PO TABS: 2.5000 mg | ORAL_TABLET | Freq: Two times a day (BID) | ORAL | 6 refills | Status: DC

## 2018-10-10 NOTE — Telephone Encounter (Signed)
New message   Patient would like a return call. She states that sh received a call from [place]. Please advise.

## 2018-10-10 NOTE — Telephone Encounter (Addendum)
Per Pecolia Ades PA: I talked to the patient at her last visit about possibly starting Corlanor (Ivabradine). I ran it by Dr. Burt Knack. I think we can start a low dose of 2.5 mg bid for tachycardia. It should help lower her heart rate which may help improve the pumping function. Tell her that if it is too expensive, she should not get it if it causes a hardship on her.   I have spoke with pt and she is okay with starting new medication. I informed her if medication was too expensive to call our office and let us know so when can consider an alternative.

## 2018-10-10 NOTE — Telephone Encounter (Signed)
Pt states that she call the pharmacy to check the price of the Corlanor and the medication is too expensive. She states that her copay is $100 and she cannot afford that.

## 2018-10-10 NOTE — Addendum Note (Signed)
Addended by: Mendel Ryder on: 10/10/2018 11:29 AM   Modules accepted: Orders

## 2018-10-10 NOTE — Telephone Encounter (Signed)
OK. You can take it off her med list. It was worth a try.

## 2018-10-19 ENCOUNTER — Emergency Department (HOSPITAL_COMMUNITY)
Admission: EM | Admit: 2018-10-19 | Discharge: 2018-10-19 | Disposition: A | Discharge status: Home / Self Care | Payer: Medicare Other | Attending: Emergency Medicine | Admitting: Emergency Medicine

## 2018-10-19 ENCOUNTER — Encounter (HOSPITAL_COMMUNITY): Payer: Self-pay

## 2018-10-19 ENCOUNTER — Emergency Department (HOSPITAL_COMMUNITY): Payer: Medicare Other

## 2018-10-19 ENCOUNTER — Other Ambulatory Visit: Payer: Self-pay

## 2018-10-19 DIAGNOSIS — R42 Dizziness and giddiness: Secondary | ICD-10-CM | POA: Diagnosis not present

## 2018-10-19 DIAGNOSIS — I739 Peripheral vascular disease, unspecified: Secondary | ICD-10-CM | POA: Diagnosis not present

## 2018-10-19 DIAGNOSIS — I2510 Atherosclerotic heart disease of native coronary artery without angina pectoris: Secondary | ICD-10-CM | POA: Insufficient documentation

## 2018-10-19 DIAGNOSIS — F17210 Nicotine dependence, cigarettes, uncomplicated: Secondary | ICD-10-CM | POA: Insufficient documentation

## 2018-10-19 DIAGNOSIS — R0602 Shortness of breath: Secondary | ICD-10-CM | POA: Insufficient documentation

## 2018-10-19 DIAGNOSIS — R112 Nausea with vomiting, unspecified: Secondary | ICD-10-CM | POA: Insufficient documentation

## 2018-10-19 DIAGNOSIS — Z79899 Other long term (current) drug therapy: Secondary | ICD-10-CM | POA: Insufficient documentation

## 2018-10-19 DIAGNOSIS — E119 Type 2 diabetes mellitus without complications: Secondary | ICD-10-CM | POA: Insufficient documentation

## 2018-10-19 DIAGNOSIS — Z8551 Personal history of malignant neoplasm of bladder: Secondary | ICD-10-CM | POA: Insufficient documentation

## 2018-10-19 LAB — I-STAT TROPONIN, ED: Troponin i, poc: 0 ng/mL (ref 0.00–0.08)

## 2018-10-19 LAB — DIFFERENTIAL
Abs Immature Granulocytes: 0.04 10*3/uL (ref 0.00–0.07)
Basophils Absolute: 0 10*3/uL (ref 0.0–0.1)
Basophils Relative: 1 %
Eosinophils Absolute: 0.1 10*3/uL (ref 0.0–0.5)
Eosinophils Relative: 1 %
Immature Granulocytes: 1 %
Lymphocytes Relative: 16 %
Lymphs Abs: 1.4 10*3/uL (ref 0.7–4.0)
Monocytes Absolute: 0.4 10*3/uL (ref 0.1–1.0)
Monocytes Relative: 5 %
Neutro Abs: 6.6 10*3/uL (ref 1.7–7.7)
Neutrophils Relative %: 76 %

## 2018-10-19 LAB — COMPREHENSIVE METABOLIC PANEL
ALT: 15 U/L (ref 0–44)
AST: 19 U/L (ref 15–41)
Albumin: 3.4 g/dL — ABNORMAL LOW (ref 3.5–5.0)
Alkaline Phosphatase: 41 U/L (ref 38–126)
Anion gap: 9 (ref 5–15)
BUN: 21 mg/dL (ref 8–23)
CO2: 27 mmol/L (ref 22–32)
Calcium: 9.1 mg/dL (ref 8.9–10.3)
Chloride: 100 mmol/L (ref 98–111)
Creatinine, Ser: 0.91 mg/dL (ref 0.44–1.00)
GFR calc Af Amer: >60 mL/min (ref >60)
GFR calc non Af Amer: >60 mL/min (ref >60)
Glucose, Bld: 240 mg/dL — ABNORMAL HIGH (ref 70–99)
Potassium: 4.1 mmol/L (ref 3.5–5.1)
Sodium: 136 mmol/L (ref 135–145)
Total Bilirubin: 1.1 mg/dL (ref 0.3–1.2)
Total Protein: 6.1 g/dL — ABNORMAL LOW (ref 6.5–8.1)

## 2018-10-19 LAB — URINALYSIS, ROUTINE W REFLEX MICROSCOPIC
Bilirubin Urine: NEGATIVE
Glucose, UA: NEGATIVE mg/dL
Hgb urine dipstick: NEGATIVE
Ketones, ur: NEGATIVE mg/dL
Leukocytes, UA: NEGATIVE
Nitrite: NEGATIVE
Protein, ur: NEGATIVE mg/dL
Specific Gravity, Urine: 1.006 (ref 1.005–1.030)
pH: 6 (ref 5.0–8.0)

## 2018-10-19 LAB — CBC
HCT: 37.3 % (ref 36.0–46.0)
Hemoglobin: 11.6 g/dL — ABNORMAL LOW (ref 12.0–15.0)
MCH: 32.8 pg (ref 26.0–34.0)
MCHC: 31.1 g/dL (ref 30.0–36.0)
MCV: 105.4 fL — ABNORMAL HIGH (ref 80.0–100.0)
Platelets: 176 10*3/uL (ref 150–400)
RBC: 3.54 MIL/uL — ABNORMAL LOW (ref 3.87–5.11)
RDW: 14.4 % (ref 11.5–15.5)
WBC: 8.5 10*3/uL (ref 4.0–10.5)
nRBC: 0 % (ref 0.0–0.2)

## 2018-10-19 LAB — PROTIME-INR
INR: 1.14
Prothrombin Time: 14.5 seconds (ref 11.4–15.2)

## 2018-10-19 LAB — RAPID URINE DRUG SCREEN, HOSP PERFORMED
Amphetamines: NOT DETECTED
Barbiturates: NOT DETECTED
Benzodiazepines: NOT DETECTED
Cocaine: NOT DETECTED
Opiates: NOT DETECTED
Tetrahydrocannabinol: NOT DETECTED

## 2018-10-19 LAB — ETHANOL: Alcohol, Ethyl (B): <10 mg/dL (ref <10)

## 2018-10-19 LAB — APTT: aPTT: 31 seconds (ref 24–36)

## 2018-10-19 MED ORDER — ONDANSETRON HCL 4 MG/2ML IJ SOLN
4.0000 mg | Freq: Once | INTRAMUSCULAR | Status: DC
  Filled 2018-10-19: qty 2

## 2018-10-19 MED ORDER — MECLIZINE HCL 12.5 MG PO TABS: 12.5000 mg | ORAL_TABLET | Freq: Three times a day (TID) | ORAL | 0 refills | Status: DC | PRN

## 2018-10-19 MED ORDER — MECLIZINE HCL 25 MG PO TABS
25.0000 mg | ORAL_TABLET | Freq: Once | ORAL | Status: AC
  Administered 2018-10-19: 25 mg via ORAL
  Filled 2018-10-19: qty 1

## 2018-10-19 MED ORDER — LORAZEPAM 1 MG PO TABS
1.0000 mg | ORAL_TABLET | Freq: Once | ORAL | Status: AC
  Administered 2018-10-19: 1 mg via ORAL
  Filled 2018-10-19: qty 1

## 2018-10-19 MED ORDER — SODIUM CHLORIDE 0.9 % IV BOLUS: 250.0000 mL | Freq: Once | INTRAVENOUS | Status: DC

## 2018-10-19 MED ORDER — LORAZEPAM 2 MG/ML IJ SOLN: 1.0000 mg | Freq: Once | INTRAMUSCULAR | Status: DC

## 2018-10-19 NOTE — ED Notes (Signed)
Pt refused IV/ nausea medication. Reports she is not nauseated. Ambulated to bathroom without difficulty.

## 2018-10-19 NOTE — ED Provider Notes (Signed)
[place] EMERGENCY DEPARTMENT Provider Note   CSN: [id number] Arrival date & time: 10/19/18  1010     History   Chief Complaint Chief Complaint  Patient presents with  . Nausea  . Dizziness    HPI Hannah French is a 78 y.o. female with history of diabetes mellitus, dyslipidemia, mitral valve prolapse, pancreatitis, PVD, pseudogout presents for evaluation of acute onset, constant dizziness since yesterday morning.  Reports the symptoms were present when she awoke at around 5 AM.  She notes that she feels better at rest but still feels quite nauseated and has had multiple episodes of nonbloody nonbilious emesis which she describes as "dry heaving".  Symptoms worsen with any attempts to ambulate and reports she feels unsteady.  Also reports she feels lightheaded.  Denies room spinning sensation.  No syncope.  Denies fevers, abdominal pain, or chest pain.  Denies headaches, slurred speech, facial droop, numbness, tingling, weakness, or vision changes she does note that she feels somewhat short of breath when attempting to ambulate.  Has not tried anything for her symptoms.  Was recently admitted to the cardiology service and underwent a left heart cath on 09/16/2018 which showed nonobstructive CAD, mild pulmonary hypertension.  At the time medical therapy was recommended for her CHF and she was started on losartan 25 mg which she reports she has been compliant with.  She continues to smoke.  The history is provided by the patient and a relative.    Past Medical History:  Diagnosis Date  . Abdominal bruit   . Bladder cancer ([place])   . Carotid bruit   . Diabetes mellitus    Type II  . Dyslipidemia   . Heart murmur   . Mitral valve prolapse   . Osteoporosis   . Pancreatitis, acute   . Pseudogout   . PVD (peripheral vascular disease) [facility])     Patient Active Problem List   Diagnosis Date Noted  . CAD (coronary artery disease) 40-50% prox LAD 10/04/2018    . Dyslipidemia, goal LDL below 70 10/04/2018  . Acute systolic heart failure ([place]) 09/16/2018  . Myalgia due to statin 03/29/2018  . COPD (chronic obstructive pulmonary disease) ([place]) 10/26/2017  . Tobacco abuse 03/07/2016  . Type 2 diabetes mellitus ([place]) 08/26/2015  . Osteoporosis 09/02/2014  . Carotid stenosis 07/22/2012  . PVD 07/02/2009  . BLADDER CANCER 06/05/2009  . Hyperlipidemia associated with type 2 diabetes mellitus ([place]) 06/05/2009  . PSEUDOGOUT 06/05/2009  . MITRAL VALVE PROLAPSE 06/05/2009    Past Surgical History:  Procedure Laterality Date  . APPENDECTOMY    . Hysterectomy-type unspecified    . LAPAROSCOPIC CHOLECYSTECTOMY  2009  . RIGHT/LEFT HEART CATH AND CORONARY ANGIOGRAPHY N/A 09/16/2018   Procedure: RIGHT/LEFT HEART CATH AND CORONARY ANGIOGRAPHY;  Surgeon: Sherren Mocha, MD;  Location: [place] [facility];  Service: Cardiovascular;  Laterality: N/A;     OB History   No obstetric history on file.      Home Medications    Prior to Admission medications   Medication Sig Start Date End Date Taking? Authorizing Provider  Calcium-Vitamin D-Vitamin K (CALCIUM + D + K PO) Take 1 tablet by mouth every other day.   Yes [provider]  Cholecalciferol (VITAMIN D) 50 MCG (2000 UT) tablet Take 6,000 Units by mouth daily.   Yes [provider]  Fluticasone-Salmeterol (ADVAIR) 100-50 MCG/DOSE AEPB Inhale 1 puff into the lungs 2 (two) times daily. 08/21/18  Yes Dettinger, Fransisca Kaufmann,  MD  furosemide (LASIX) 20 MG tablet Take 1 tablet (20 mg total) by mouth daily. You may take an extra dose for increase weight gain or swelling 10/04/18  Yes Daune Perch, NP  glimepiride (AMARYL) 4 MG tablet TAKE (1) TABLET DAILY AS DIRECTED. Patient taking differently: Take 4 mg by mouth daily with breakfast.  09/05/17  Yes Dettinger, Fransisca Kaufmann, MD  ibuprofen (ADVIL,MOTRIN) 200 MG tablet Take 400 mg by mouth 2 (two) times daily as needed (for pain).    Yes [provider]  losartan (COZAAR) 25 MG tablet Take 1 tablet (25 mg total) by mouth daily. 09/16/18 09/16/19 Yes Sherren Mocha, MD  metoprolol succinate (TOPROL-XL) 25 MG 24 hr tablet Take 25 mg by mouth daily.   Yes [provider]  vitamin B-12 (CYANOCOBALAMIN) 1000 MCG tablet Take 1,000 mcg by mouth daily.   Yes [provider]  ivabradine (CORLANOR) 5 MG TABS tablet Take 0.5 tablets (2.5 mg total) by mouth 2 (two) times daily with a meal. Patient not taking: Reported on 10/19/2018 10/10/18   Daune Perch, NP  meclizine (ANTIVERT) 12.5 MG tablet Take 1 tablet (12.5 mg total) by mouth 3 (three) times daily as needed for dizziness. 10/19/18   Vasilia Dise A, PA-C  ONETOUCH DELICA LANCETS 28U MISC USE   TO CHECK GLUCOSE TWICE DAILY AND AS NEEDED 01/04/18   Dettinger, Fransisca Kaufmann, MD  ONETOUCH VERIO test strip USE TO CHECK BLOOD GLUCOSE TWICE DAILY AND AS NEEDED 06/03/18   Dettinger, Fransisca Kaufmann, MD    Family History Family History  Problem Relation Age of Onset  . Heart failure Mother   . Heart attack Brother   . Heart attack Maternal Uncle   . Hypertension Sister   . Hypertension Brother   . Coronary artery disease Neg Hx        Early    Social History Social History   Tobacco Use  . Smoking status: Current Every Day Smoker    Packs/day: 1.00    Years: 30.00    Pack years: 30.00    Types: Cigarettes  . Smokeless tobacco: Never Used  Substance Use Topics  . Alcohol use: No  . Drug use: No     Allergies   Contrast media [iodinated diagnostic agents]; Iohexol; Metformin; Pravastatin; Albuterol; Bextra [valdecoxib]; Cefdinir; Nsaids; Statins; Sulfa antibiotics; Zetia [ezetimibe]; and Rofecoxib   Review of Systems Review of Systems  Constitutional: Negative for chills and fever.  Eyes: Negative for visual disturbance.  Respiratory: Positive for shortness of breath.   Cardiovascular: Negative for chest pain.  Gastrointestinal: Positive for nausea and vomiting.  Negative for abdominal pain.  Musculoskeletal: Positive for gait problem.  Neurological: Positive for dizziness and light-headedness. Negative for syncope, weakness and numbness.  All other systems reviewed and are negative.    Physical Exam Updated Vital Signs BP 128/66   Pulse 76   Temp 98.3 F (36.8 C) (Oral)   Resp 18   Ht 5\' 1"  (1.549 m)   Wt 54.9 kg   SpO2 93%   BMI 22.86 kg/m   Physical Exam Vitals signs and nursing note reviewed.  Constitutional:      General: She is not in acute distress.    Appearance: She is well-developed.  HENT:     Head: Normocephalic and atraumatic.  Eyes:     General:        Right eye: No discharge.        Left eye: No discharge.  Conjunctiva/sclera: Conjunctivae normal.     Pupils: Pupils are equal, round, and reactive to light.     Comments: No nystagmus  Neck:     Vascular: No JVD.     Trachea: No tracheal deviation.  Cardiovascular:     Rate and Rhythm: Normal rate and regular rhythm.     Comments: 2+ radial and DP/PT pulses, trace pitting edema of the BLE.  Pulmonary:     Effort: Pulmonary effort is normal.     Comments: Bibasilar crackles, scattered coarse breath sounds.  Abdominal:     General: Abdomen is flat. There is no distension.     Tenderness: There is no abdominal tenderness. There is no guarding or rebound.  Skin:    General: Skin is warm and dry.     Findings: No erythema.  Neurological:     Mental Status: She is alert and oriented to person, place, and time.     Cranial Nerves: No cranial nerve deficit.     Sensory: No sensory deficit.     Coordination: Coordination abnormal.     Gait: Gait abnormal.     Comments: Mental Status:  Alert, thought content appropriate, able to give a coherent history. Speech fluent without evidence of aphasia. Able to follow 2 step commands without difficulty.  Cranial Nerves:  II:  Peripheral visual fields grossly normal, pupils equal, round, reactive to light III,IV, VI:  ptosis not present, extra-ocular motions intact bilaterally  V,VII: smile symmetric, facial light touch sensation equal VIII: hearing grossly normal to voice  X: uvula elevates symmetrically  XI: bilateral shoulder shrug symmetric and strong XII: midline tongue extension without fassiculations Motor:  Normal tone. 5/5 strength of BUE and BLE major muscle groups including strong and equal grip strength and dorsiflexion/plantar flexion, no pronator drift.  Sensory: light touch normal in all extremities. Cerebellar: Romberg sign present Gait: ambulates with an unsteady gait, unable to ambulate more than 2 steps independently.    Psychiatric:        Behavior: Behavior normal.      ED Treatments / Results  Labs (all labs ordered are listed, but only abnormal results are displayed) Labs Reviewed  CBC - Abnormal; Notable for the following components:      Result Value   RBC 3.54 (*)    Hemoglobin 11.6 (*)    MCV 105.4 (*)    All other components within normal limits  COMPREHENSIVE METABOLIC PANEL - Abnormal; Notable for the following components:   Glucose, Bld 240 (*)    Total Protein 6.1 (*)    Albumin 3.4 (*)    All other components within normal limits  URINALYSIS, ROUTINE W REFLEX MICROSCOPIC - Abnormal; Notable for the following components:   Color, Urine STRAW (*)    All other components within normal limits  ETHANOL  PROTIME-INR  APTT  DIFFERENTIAL  RAPID URINE DRUG SCREEN, HOSP PERFORMED  I-STAT TROPONIN, ED    EKG EKG Interpretation  Date/Time:  Saturday October 19 2018 10:25:12 EST Ventricular Rate:  84 PR Interval:    QRS Duration: 133 QT Interval:  423 QTC Calculation: 501 R Axis:   -69 Text Interpretation:  Sinus rhythm Left bundle branch block Confirmed by Lennice Sites [phone]) on 10/19/2018 10:33:41 AM Also confirmed by Lennice Sites [phone]), editor Radene Gunning [phone])  on 10/19/2018 11:36:01 AM   Radiology Ct Head Wo Contrast  Result Date:  10/19/2018 CLINICAL DATA:  Dizziness and feeling off balance. EXAM: CT HEAD WITHOUT CONTRAST TECHNIQUE: Contiguous axial  images were obtained from the base of the skull through the vertex without intravenous contrast. COMPARISON:  10/21/2013 FINDINGS: Brain: No evidence of acute infarction, hemorrhage, hydrocephalus, extra-axial collection or mass lesion/mass effect. 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 There is mild diffuse low-attenuation within the subcortical and periventricular white matter compatible with chronic microvascular disease. Prominence of the sulci and ventricles compatible with brain atrophy. Vascular: No hyperdense vessel or unexpected calcification. Skull: Normal. Negative for fracture or focal lesion. Sinuses/Orbits: The paranasal sinuses a in mastoid air cells appear clear. Other: None IMPRESSION: 1. No acute intracranial abnormalities. 2. Chronic small vessel ischemic change and brain atrophy. Electronically Signed   By: Kerby Moors M.D.   On: 10/19/2018 12:31    Procedures Procedures (including critical care time)  Medications Ordered in ED Medications  ondansetron (ZOFRAN) injection 4 mg (4 mg Intravenous Not Given 10/19/18 1149)  sodium chloride 0.9 % bolus 250 mL (250 mLs Intravenous Not Given 10/19/18 1149)  meclizine (ANTIVERT) tablet 25 mg (25 mg Oral Given 10/19/18 1201)  LORazepam (ATIVAN) tablet 1 mg (1 mg Oral Given 10/19/18 1516)     Initial Impression / Assessment and Plan / ED Course  I have reviewed the triage vital signs and the nursing notes.  Pertinent labs & imaging results that were available during my care of the patient were reviewed by me and considered in my medical decision making (see chart for details).     Patient presenting for evaluation of acute onset dizziness (described as "my equilibrium is off") with nausea and vomiting noted upon awakening at 5am yesterday morning.  She is afebrile, vital signs are stable.  She is nontoxic  in appearance.  She has difficulty ambulating but otherwise no focal neurologic deficits.  Minimal improvement with meclizine.  EKG reviewed by me shows left bundle branch block, no acute ischemic changes.  Remainder lab work reviewed by me shows mild anemia, no metabolic derangements, UA does not suggest UTI or nephrolithiasis.  Head CT shows no acute intracranial abnormalities but does show chronic small vessel ischemic changes and brain atrophy.  With concern for possible posterior circulation stroke, will obtain MRI for further evaluation.  4:52 PM Signed out to oncoming provider Dr. Ashok Cordia. Pending MRI. Patient ambulated with the aid of a cane on Dr. Sanjuana Kava assessment. If no acute abnormalities on MRI and patient ambulating steadily, stable for discharge home with prescription for meclizine and follow up with PCP or outpatient neurology. Otherwise patient may require admission for management of her dizziness. Patient seen and evaluated by Dr. Ronnald Nian who agrees with assessment and plan at this time.   Final Clinical Impressions(s) / ED Diagnoses   Final diagnoses:  Dizziness  Non-intractable vomiting with nausea, unspecified vomiting type    ED Discharge Orders         Ordered    meclizine (ANTIVERT) 12.5 MG tablet  3 times daily PRN     10/19/18 1638           Renita Papa, PA-C 10/19/18 Bruce, [place], DO 10/19/18 1653

## 2018-10-19 NOTE — ED Provider Notes (Signed)
Medical screening examination/treatment/procedure(s) were conducted as a shared visit with non-physician practitioner(s) and myself.  I personally evaluated the patient during the encounter. Briefly, the patient is a 78 y.o. female with history of high cholesterol, diabetes who presents to the ED with dizziness.  Patient with overall normal vitals.  EKG shows sinus rhythm.  Unchanged from prior.  No chest pain, no shortness of breath.  Patient with mostly intermittent dizziness.  Worse with movement.  Upon my evaluation symptoms are improving.  She does not feel dizzy at rest.  Patient had negative troponin.  Normal alcohol level.  Negative UDS.  CT scan was unremarkable.  Patient has normal neurological exam.  She does appear slightly unsteady when she walks.  She usually uses a cane at home.  Patient overall with unremarkable labs.  Most likely a peripheral process but will obtain an MRI to rule out central process given minor gait instability. If negative okay for d/c to home with meclizine and outpatient follow up.    EKG Interpretation  Date/Time:  Saturday October 19 2018 10:25:12 EST Ventricular Rate:  84 PR Interval:    QRS Duration: 133 QT Interval:  423 QTC Calculation: 501 R Axis:   -69 Text Interpretation:  Sinus rhythm Left bundle branch block Confirmed by Lennice Sites [phone]) on 10/19/2018 10:33:41 AM Also confirmed by Lennice Sites [phone]), editor Radene Gunning [phone])  on 10/19/2018 11:36:01 AM           Lennice Sites, DO 10/19/18 1514

## 2018-10-19 NOTE — ED Provider Notes (Signed)
Signed out by EDP/APP team at 1645 to d/c to home if/when MRI shows no acute stroke.  MRI result reviewed - no acute stroke.   Pt alert, content, normal vitals.   Pt currently appears stable for d/c.      Lajean Saver, MD 10/19/18 [phone]

## 2018-10-19 NOTE — ED Triage Notes (Signed)
Pt arrives with dizziness since yesterday and nausea since this morning with "some" vomitting. Alert oriented x4 Sob- feels like she can't take a deep breath and this worsens when she walks

## 2018-10-19 NOTE — Discharge Instructions (Addendum)
Start taking meclizine as prescribed as needed for dizziness.  This medication may make you drowsy.  Continue to use the aid of a cane or walker to help you ambulate at home.  Follow-up with a primary care physician or neurologist for reevaluation of your symptoms.  Return to the emergency department if any concerning signs or symptoms develop such as weakness, slurred speech, altered mental status, passing out, or persistent vomiting.

## 2018-11-21 ENCOUNTER — Telehealth: Payer: Self-pay | Admitting: Cardiovascular Disease

## 2018-11-21 DIAGNOSIS — I447 Left bundle-branch block, unspecified: Secondary | ICD-10-CM

## 2018-11-21 DIAGNOSIS — I5189 Other ill-defined heart diseases: Secondary | ICD-10-CM

## 2018-11-21 NOTE — Telephone Encounter (Signed)
New Message:       Pt wants to know , when does she need her next Echo?

## 2018-11-21 NOTE — Telephone Encounter (Signed)
This encounter was created in error - please disregard.

## 2018-11-26 ENCOUNTER — Other Ambulatory Visit: Payer: Self-pay | Admitting: Family Medicine

## 2018-12-02 NOTE — Telephone Encounter (Signed)
Per Dr. Burt Knack, Ms. Hannah French will need an echo about 6 months after her heart catheterization. Called and scheduled her for echo and OV with Dr. Burt Knack on June 12.  While on the phone, she asked if it is normal to feel dizzy and for her heart to race. She stated her HR has been between 100 and 110 beats per minute for the past two weeks and she sometimes feels like it's racing. Her BP runs around 104-110/60. Scheduled her for evaluation tomorrow for dizziness and elevated HR. She was grateful for call and agrees with treatment plan.

## 2018-12-03 ENCOUNTER — Ambulatory Visit: Payer: Medicare Other | Admitting: Cardiology

## 2018-12-03 ENCOUNTER — Encounter: Payer: Self-pay | Admitting: Cardiology

## 2018-12-03 VITALS — BP 99/62 | HR 91 | Ht 60.1 in | Wt 121.4 lb

## 2018-12-03 DIAGNOSIS — I5022 Chronic systolic (congestive) heart failure: Secondary | ICD-10-CM | POA: Diagnosis not present

## 2018-12-03 DIAGNOSIS — Z720 Tobacco use: Secondary | ICD-10-CM | POA: Diagnosis not present

## 2018-12-03 DIAGNOSIS — R42 Dizziness and giddiness: Secondary | ICD-10-CM

## 2018-12-03 DIAGNOSIS — I2510 Atherosclerotic heart disease of native coronary artery without angina pectoris: Secondary | ICD-10-CM

## 2018-12-03 DIAGNOSIS — I5042 Chronic combined systolic (congestive) and diastolic (congestive) heart failure: Secondary | ICD-10-CM | POA: Insufficient documentation

## 2018-12-03 NOTE — Patient Instructions (Addendum)
Medication Instructions:  Your physician recommends that you continue on your current medications as directed. Please refer to the Current Medication list given to you today.  If you need a refill on your cardiac medications before your next appointment, please call your pharmacy.   Lab work: None  If you have labs (blood work) drawn today and your tests are completely normal, you will receive your results only by: Marland Kitchen MyChart Message (if you have MyChart) OR . A paper copy in the mail If you have any lab test that is abnormal or we need to change your treatment, we will call you to review the results.  Testing/Procedures: None  Follow-Up: You are scheduled to see Dr. Burt Knack on 03/07/2019 @ 3:0 PM  Any Other Special Instructions Will Be Listed Below (If Applicable).   Smoking Tobacco Information, Adult Smoking tobacco can be harmful to your health. Tobacco contains a poisonous (toxic), colorless chemical called nicotine. Nicotine is addictive. It changes the brain and can make it hard to stop smoking. Tobacco also has other toxic chemicals that can hurt your body and raise your risk of many cancers. How can smoking tobacco affect me? Smoking tobacco puts you at risk for:  Cancer. Smoking is most commonly associated with lung cancer, but can also lead to cancer in other parts of the body.  Chronic obstructive pulmonary disease (COPD). This is a long-term lung condition that makes it hard to breathe. It also gets worse over time.  High blood pressure (hypertension), heart disease, stroke, or heart attack.  Lung infections, such as pneumonia.  Cataracts. This is when the lenses in the eyes become clouded.  Digestive problems. This may include peptic ulcers, heartburn, and gastroesophageal reflux disease (GERD).  Oral health problems, such as gum disease and tooth loss.  Loss of taste and smell. Smoking can affect your appearance by causing:  Wrinkles.  Yellow or stained teeth,  fingers, and fingernails. Smoking tobacco can also affect your social life, because:  It may be challenging to find places to smoke when away from home. Many workplaces, [employer], hotels, and public places are tobacco-free.  Smoking is expensive. This is due to the cost of tobacco and the long-term costs of treating health problems from smoking.  Secondhand smoke may affect those around you. Secondhand smoke can cause lung cancer, breathing problems, and heart disease. Children of smokers have a higher risk for: ? Sudden infant death syndrome (SIDS). ? Ear infections. ? Lung infections. If you currently smoke tobacco, quitting now can help you:  Lead a longer and healthier life.  Look, smell, breathe, and feel better over time.  Save money.  Protect others from the harms of secondhand smoke. What actions can I take to prevent health problems? Quit smoking   Do not start smoking. Quit if you already do.  Make a plan to quit smoking and commit to it. Look for programs to help you and ask your health care provider for recommendations and ideas.  Set a date and write down all the reasons you want to quit.  Let your friends and family know you are quitting so they can help and support you. Consider finding friends who also want to quit. It can be easier to quit with someone else, so that you can support each other.  Talk with your health care provider about using nicotine replacement medicines to help you quit, such as gum, lozenges, patches, sprays, or pills.  Do not replace cigarette smoking with electronic cigarettes, which  are commonly called e-cigarettes. The safety of e-cigarettes is not known, and some may contain harmful chemicals.  If you try to quit but return to smoking, stay positive. It is common to slip up when you first quit, so take it one day at a time.  Be prepared for cravings. When you feel the urge to smoke, chew gum or suck on hard candy. Lifestyle  Stay  busy and take care of your body.  Drink enough fluid to keep your urine pale yellow.  Get plenty of exercise and eat a healthy diet. This can help prevent weight gain after quitting.  Monitor your eating habits. Quitting smoking can cause you to have a larger appetite than when you smoke.  Find ways to relax. Go out with friends or family to a movie or a restaurant where people do not smoke.  Ask your health care provider about having regular tests (screenings) to check for cancer. This may include blood tests, imaging tests, and other tests.  Find ways to manage your stress, such as meditation, yoga, or exercise. Where to find support To get support to quit smoking, consider:  Asking your health care provider for more information and resources.  Taking classes to learn more about quitting smoking.  Looking for local organizations that offer resources about quitting smoking.  Joining a support group for people who want to quit smoking in your local community.  Calling the smokefree.gov counselor helpline: 1-800-Quit-Now (856)871-8478) Where to find more information You may find more information about quitting smoking from:  HelpGuide.org: www.helpguide.org  https://hall.com/: smokefree.gov  American Lung Association: www.lung.org Contact a health care provider if you:  Have problems breathing.  Notice that your lips, nose, or fingers turn blue.  Have chest pain.  Are coughing up blood.  Feel faint or you pass out.  Have other health changes that cause you to worry. Summary  Smoking tobacco can negatively affect your health, the health of those around you, your finances, and your social life.  Do not start smoking. Quit if you already do. If you need help quitting, ask your health care provider.  Think about joining a support group for people who want to quit smoking in your local community. There are many effective programs that will help you to quit this  behavior. This information is not intended to replace advice given to you by your health care provider. Make sure you discuss any questions you have with your health care provider. Document Released: 09/26/2016 Document Revised: 10/31/2017 Document Reviewed: 09/26/2016 Elsevier Interactive Patient Education  2019 [employer].

## 2018-12-03 NOTE — Progress Notes (Signed)
Cardiology Office Note:    Date:  12/03/2018   ID:  Hannah French, DOB 06/15/1941, MRN [id number]  PCP:  Dettinger, Fransisca Kaufmann, MD  Cardiologist:  Sherren Mocha, MD  Referring MD: Dettinger, Fransisca Kaufmann, MD   Chief Complaint  Patient presents with  . Dizziness    History of Present Illness:    Hannah French is a 78 y.o. 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  Her PAD has been managed medically considering lack of symptoms.  She is a longstanding smoker with COPD and has type 2 diabetes.    She was seen in the office on 08/26/2018 by Dr. Burt Knack and was noted to have shortness of breath felt to be related to COPD and URI.  She did have a progression on her EKG from previous IVCD to left bundle branch block.  An echo was done which showed a decline in LV function with EF 30-35% with diffuse hypokinesis, prior was normal.  With new LBBB significant change in LV function she was arranged for right and left heart cath which was done on 09/16/2018 which showed nonobstructive CAD with diffuse luminal irregularities involving the left main, left circumflex and RCA.  There was 40-50% calcific stenosis of the proximal LAD.  There was mild pulmonary hypertension and mild elevation in LVEDP with preserved cardiac output.  She was recommended for medical therapy for CHF and nonobstructive CAD.   The patient was seen in the ED 10/19/2018 for complaints of dizziness.  EKG showed normal sinus rhythm and she had no chest pain or shortness of breath.  Her dizziness was worse with movement and did not occur at rest.  She had negative troponin, normal alcohol level, negative urine drug screen.  CT scan was unremarkable and she had a normal neurological exam.  Brain MRI showed no acute findings.  The patient complained of dizziness and heart racing on the phone to staff so she was scheduled to be seen today. Her  dizziness that she was seen for in the ED resolved. She is now only dizzy if she turns her head quickly. She says her heart rate was going faster recently.  She says it was in the 80's then up into the 90's this week. She is feeling well although she is still not up to her usual daily activities. Yesterday she went shopping for ~2.5 hours and she was exhausted. She is able to do light housework, taking breaks.   She has been taking her lasix 20 mg daily with occ extra dose as needed. Today was the first time in 2 weeks that she took an extra dose for increased wt by 2 lbs.   Home BPs avg 100/60    Past Medical History:  Diagnosis Date  . Abdominal bruit   . Bladder cancer ([place])   . Carotid bruit   . Diabetes mellitus    Type II  . Dyslipidemia   . Heart murmur   . Mitral valve prolapse   . Osteoporosis   . Pancreatitis, acute   . Pseudogout   . PVD (peripheral vascular disease) ([place])     Past Surgical History:  Procedure Laterality Date  . APPENDECTOMY    . Hysterectomy-type unspecified    . LAPAROSCOPIC CHOLECYSTECTOMY  2009  . RIGHT/LEFT HEART CATH AND CORONARY ANGIOGRAPHY N/A 09/16/2018   Procedure: RIGHT/LEFT HEART CATH AND CORONARY ANGIOGRAPHY;  Surgeon: Sherren Mocha, MD;  Location:  [facility];  Service: Cardiovascular;  Laterality: N/A;    Current Medications: Current Meds  Medication Sig  . Calcium-Vitamin D-Vitamin K (CALCIUM + D + K PO) Take 1 tablet by mouth every other day.  . Cholecalciferol (VITAMIN D) 50 MCG (2000 UT) tablet Take 6,000 Units by mouth daily.  . Fluticasone-Salmeterol (ADVAIR) 100-50 MCG/DOSE AEPB Inhale 1 puff into the lungs 2 (two) times daily.  . furosemide (LASIX) 20 MG tablet Take 1 tablet (20 mg total) by mouth daily. You may take an extra dose for increase weight gain or swelling  . glimepiride (AMARYL) 4 MG tablet TAKE (1) TABLET DAILY AS DIRECTED.  Marland Kitchen ibuprofen (ADVIL,MOTRIN) 200 MG tablet Take 400 mg by mouth 2 (two) times  daily as needed (for pain).   Marland Kitchen losartan (COZAAR) 25 MG tablet Take 1 tablet (25 mg total) by mouth daily.  . meclizine (ANTIVERT) 12.5 MG tablet Take 1 tablet (12.5 mg total) by mouth 3 (three) times daily as needed for dizziness.  . metoprolol succinate (TOPROL-XL) 25 MG 24 hr tablet Take 25 mg by mouth daily.  Glory Rosebush DELICA LANCETS 81O MISC USE   TO CHECK GLUCOSE TWICE DAILY AND AS NEEDED  . ONETOUCH VERIO test strip USE TO CHECK BLOOD GLUCOSE TWICE DAILY AND AS NEEDED  . vitamin B-12 (CYANOCOBALAMIN) 1000 MCG tablet Take 1,000 mcg by mouth daily.     Allergies:   Contrast media [iodinated diagnostic agents]; Iohexol; Metformin; Pravastatin; Albuterol; Bextra [valdecoxib]; Cefdinir; Nsaids; Statins; Sulfa antibiotics; Zetia [ezetimibe]; and Rofecoxib   Social History   Socioeconomic History  . Marital status: Divorced    Spouse name: Not on file  . Number of children: 2  . Years of education: Not on file  . Highest education level: Not on file  Occupational History  . Occupation: Retired  Scientific laboratory technician  . Financial resource strain: Not on file  . Food insecurity:    Worry: Not on file    Inability: Not on file  . Transportation needs:    Medical: Not on file    Non-medical: Not on file  Tobacco Use  . Smoking status: Current Every Day Smoker    Packs/day: 1.00    Years: 30.00    Pack years: 30.00    Types: Cigarettes  . Smokeless tobacco: Never Used  Substance and Sexual Activity  . Alcohol use: No  . Drug use: No  . Sexual activity: Not on file  Lifestyle  . Physical activity:    Days per week: Not on file    Minutes per session: Not on file  . Stress: Not on file  Relationships  . Social connections:    Talks on phone: Not on file    Gets together: Not on file    Attends religious service: Not on file    Active member of club or organization: Not on file    Attends meetings of clubs or organizations: Not on file    Relationship status: Not on file  Other  Topics Concern  . Not on file  Social History Narrative  . Not on file     Family History: [name] family history includes Heart attack in her brother and maternal uncle; Heart failure in her mother; Hypertension in her brother and sister. There is no history of Coronary artery disease. ROS:   Please see the history of present illness.     All other systems reviewed and are negative.  EKGs/Labs/Other Studies Reviewed:  The following studies were reviewed today: RIGHT/LEFT HEART CATH AND CORONARY ANGIOGRAPHY 09/16/18  Conclusion    Prox LAD lesion is 40% stenosed.  1. Nonobstructive coronary artery disease with diffuse luminal irregularities involving the left main, left circumflex, and RCA. 40 to 50% calcific stenosis of the proximal LAD is noted. 2. Known severe LV systolic dysfunction by echo assessment 3. Mild pulmonary hypertension and mild elevation of LVEDP 4. Preserved cardiac output  Recommend: Medical therapy for congestive heart failure and nonobstructive CAD. Will add losartan 25 mg to her medical regimen. Will need to follow renal function closely and will draw metabolic panel in about 2 weeks.   Echocardiogram 08/28/2018 Study Conclusions - Left ventricle: The cavity size was normal. There was mild concentric hypertrophy. Systolic function was moderately to severely reduced. The estimated ejection fraction was in the range of 30% to 35%. Diffuse hypokinesis. Features are consistent with a pseudonormal left ventricular filling pattern, with concomitant abnormal relaxation and increased filling pressure (grade 2 diastolic dysfunction). Doppler parameters are consistent with elevated ventricular end-diastolic filling pressure. - Ventricular septum: Septal motion showed paradox. - Aortic valve: There was no regurgitation. - Mitral valve: There was mild regurgitation. - Right ventricle: The cavity size was normal. Wall thickness  was normal. Systolic function was normal. - Tricuspid valve: There was mild regurgitation. - Pericardium, extracardiac: There was no pericardial effusion.  Impressions: - Since the last study on 03/07/2016 there has been a significant change, LVEF has decreased from 60-65% to 30% with diffuse hypokinesis and paradoxical septal motion. Filling pressures are elevated.   EKG:  EKG is ordered today.  The ekg ordered today demonstrates NSR, LAD, abnormal r wave progression, lateral t wave abnormality, similar to previous  Recent Labs: 08/13/2018: B Natriuretic Peptide 202.7; Magnesium 1.7 10/04/2018: NT-Pro BNP 4,124 10/19/2018: ALT 15; BUN 21; Creatinine, Ser 0.91; Hemoglobin 11.6; Platelets 176; Potassium 4.1; Sodium 136   Recent Lipid Panel    Component Value Date/Time   CHOL 170 03/20/2018 0837   TRIG 88 03/20/2018 0837   TRIG 82 06/09/2014 1028   HDL 47 03/20/2018 0837   HDL 57 06/09/2014 1028   CHOLHDL 3.6 03/20/2018 0837   CHOLHDL 3.5 02/06/2013 0932   VLDL 20 02/06/2013 0932   LDLCALC 105 (H) 03/20/2018 0837   LDLCALC 131 (H) 06/09/2014 1028    Physical Exam:    VS:  Ht 5' 0.1" (1.527 m)   Wt 121 lb 6.4 oz (55.1 kg)   SpO2 93%   BMI 23.63 kg/m     Wt Readings from Last 3 Encounters:  12/03/18 121 lb 6.4 oz (55.1 kg)  10/19/18 121 lb (54.9 kg)  10/04/18 124 lb 12.8 oz (56.6 kg)     Physical Exam  Constitutional: She is oriented to person, place, and time. No distress.  Thin, frail  HENT:  Head: Normocephalic and atraumatic.  Neck: Normal range of motion. Neck supple. No JVD present.  Cardiovascular: Normal rate, regular rhythm, normal heart sounds and intact distal pulses. Exam reveals no gallop and no friction rub.  No murmur heard. Pulmonary/Chest: Effort normal and breath sounds normal. No respiratory distress. She has no wheezes. She has no rales.  Abdominal: Soft. Bowel sounds are normal.  Musculoskeletal: Normal range of motion.        General:  No deformity or edema.  Neurological: She is alert and oriented to person, place, and time.  Skin: Skin is warm and dry.  Psychiatric: She has a normal mood and affect.  Her behavior is normal. Judgment and thought content normal.  Nursing note and vitals reviewed.  ASSESSMENT:    1. Dizziness   2. Chronic systolic CHF (congestive heart failure), NYHA class 2 (B and E)   3. Coronary artery disease involving native coronary artery of native heart without angina pectoris   4. Tobacco abuse    PLAN:    In order of problems listed above:  Dizziness -Seen in the ED for dizziness 10/19/2018. Ruled out for stroke. EKG and cardiac enzymes were negative.  -Improved since ED visit. She had some mild dizziness with turning her head too quickly yesterday but better today.  -Orthostatics negative.  -Likely inner ear disturbance.   Chronic systolic CHF/Non-ischemic, NYHA class 2 -EF 30-35% on echo 08/2018. Mild non-obstructive CAD on cath with mild pulm HTN and mildly elevated LVEDP.  -On ARB, low dose Toprol (low BP). Lasix was increased at last visit for DOE and BNP of 4,124. I had ordered corlanor for elevated HR but was too expensive.  -Breathing better and wt have been stable. No edema.  -Unable to increase BB due to soft BP. Her BP got too low on 37.5 mg of Toprol in the past.  -Pt appears euvolemic and has no specific heart failure complaints except for activity intolerance.  -I would really like to get her HR lower but we are limited by Low BP. I ordered corlanor at last visit but was too expensive for pt.  -Pt has echo and follow up scheduled for June with Dr. Burt Knack.   CAD -Mild-moderate nonobstructive CAD by cath, 40-50% calcific stenosis of the proximal LAD -No chest pain  Tobacco abuse -Continues to smoke 1 PPD. She does not have any confidence that she can quit. Her daughter also smokes making it harder for her to quit.  -I strongly advised to work on cessation for her heart health  and would also help control heart rate.   Medication Adjustments/Labs and Tests Ordered: Current medicines are reviewed at length with the patient today.  Concerns regarding medicines are outlined above. Labs and tests ordered and medication changes are outlined in the patient instructions below:  Patient Instructions  Medication Instructions:  Your physician recommends that you continue on your current medications as directed. Please refer to the Current Medication list given to you today.  If you need a refill on your cardiac medications before your next appointment, please call your pharmacy.   Lab work: None  If you have labs (blood work) drawn today and your tests are completely normal, you will receive your results only by: Marland Kitchen MyChart Message (if you have MyChart) OR . A paper copy in the mail If you have any lab test that is abnormal or we need to change your treatment, we will call you to review the results.  Testing/Procedures: None  Follow-Up: You are scheduled to see Dr. Burt Knack on 03/07/2019 @ 3:0 PM  Any Other Special Instructions Will Be Listed Below (If Applicable).   Smoking Tobacco Information, Adult Smoking tobacco can be harmful to your health. Tobacco contains a poisonous (toxic), colorless chemical called nicotine. Nicotine is addictive. It changes the brain and can make it hard to stop smoking. Tobacco also has other toxic chemicals that can hurt your body and raise your risk of many cancers. How can smoking tobacco affect me? Smoking tobacco puts you at risk for:  Cancer. Smoking is most commonly associated with lung cancer, but can also lead to cancer in other parts of the body.  Chronic obstructive pulmonary disease (COPD). This is a long-term lung condition that makes it hard to breathe. It also gets worse over time.  High blood pressure (hypertension), heart disease, stroke, or heart attack.  Lung infections, such as pneumonia.  Cataracts. This is when  the lenses in the eyes become clouded.  Digestive problems. This may include peptic ulcers, heartburn, and gastroesophageal reflux disease (GERD).  Oral health problems, such as gum disease and tooth loss.  Loss of taste and smell. Smoking can affect your appearance by causing:  Wrinkles.  Yellow or stained teeth, fingers, and fingernails. Smoking tobacco can also affect your social life, because:  It may be challenging to find places to smoke when away from home. Many workplaces, [employer], hotels, and public places are tobacco-free.  Smoking is expensive. This is due to the cost of tobacco and the long-term costs of treating health problems from smoking.  Secondhand smoke may affect those around you. Secondhand smoke can cause lung cancer, breathing problems, and heart disease. Children of smokers have a higher risk for: ? Sudden infant death syndrome (SIDS). ? Ear infections. ? Lung infections. If you currently smoke tobacco, quitting now can help you:  Lead a longer and healthier life.  Look, smell, breathe, and feel better over time.  Save money.  Protect others from the harms of secondhand smoke. What actions can I take to prevent health problems? Quit smoking   Do not start smoking. Quit if you already do.  Make a plan to quit smoking and commit to it. Look for programs to help you and ask your health care provider for recommendations and ideas.  Set a date and write down all the reasons you want to quit.  Let your friends and family know you are quitting so they can help and support you. Consider finding friends who also want to quit. It can be easier to quit with someone else, so that you can support each other.  Talk with your health care provider about using nicotine replacement medicines to help you quit, such as gum, lozenges, patches, sprays, or pills.  Do not replace cigarette smoking with electronic cigarettes, which are commonly called e-cigarettes. The  safety of e-cigarettes is not known, and some may contain harmful chemicals.  If you try to quit but return to smoking, stay positive. It is common to slip up when you first quit, so take it one day at a time.  Be prepared for cravings. When you feel the urge to smoke, chew gum or suck on hard candy. Lifestyle  Stay busy and take care of your body.  Drink enough fluid to keep your urine pale yellow.  Get plenty of exercise and eat a healthy diet. This can help prevent weight gain after quitting.  Monitor your eating habits. Quitting smoking can cause you to have a larger appetite than when you smoke.  Find ways to relax. Go out with friends or family to a movie or a restaurant where people do not smoke.  Ask your health care provider about having regular tests (screenings) to check for cancer. This may include blood tests, imaging tests, and other tests.  Find ways to manage your stress, such as meditation, yoga, or exercise. Where to find support To get support to quit smoking, consider:  Asking your health care provider for more information and resources.  Taking classes to learn more about quitting smoking.  Looking for local organizations that offer resources about quitting smoking.  Joining a support  group for people who want to quit smoking in your local community.  Calling the smokefree.gov counselor helpline: 1-800-Quit-Now 713-036-3549) Where to find more information You may find more information about quitting smoking from:  HelpGuide.org: www.helpguide.org  https://hall.com/: smokefree.gov  American Lung Association: www.lung.org Contact a health care provider if you:  Have problems breathing.  Notice that your lips, nose, or fingers turn blue.  Have chest pain.  Are coughing up blood.  Feel faint or you pass out.  Have other health changes that cause you to worry. Summary  Smoking tobacco can negatively affect your health, the health of those around  you, your finances, and your social life.  Do not start smoking. Quit if you already do. If you need help quitting, ask your health care provider.  Think about joining a support group for people who want to quit smoking in your local community. There are many effective programs that will help you to quit this behavior. This information is not intended to replace advice given to you by your health care provider. Make sure you discuss any questions you have with your health care provider. Document Released: 09/26/2016 Document Revised: 10/31/2017 Document Reviewed: 09/26/2016 Elsevier Interactive Patient Education  2019 [place], Daune Perch, NP  12/03/2018 4:31 PM    [facility]

## 2018-12-05 NOTE — Addendum Note (Signed)
Addended by: Jones Broom on: 12/05/2018 12:44 PM   Modules accepted: Orders

## 2018-12-10 ENCOUNTER — Other Ambulatory Visit: Payer: Self-pay

## 2018-12-10 MED ORDER — LOSARTAN POTASSIUM 25 MG PO TABS: 25.0000 mg | ORAL_TABLET | Freq: Every day | ORAL | 3 refills | Status: DC

## 2018-12-13 ENCOUNTER — Telehealth: Payer: Self-pay | Admitting: Cardiovascular Disease

## 2018-12-13 NOTE — Telephone Encounter (Signed)
Ms. Freeman's son, Darnell Level ([place]), called to report he doesn't think she is doing well. He is a retired Runner, broadcasting/film/video and lives in [place]. He is coming to [place] early next week and wishes to bring Ms. Hauth in to see Dr. Burt Knack.   Bruce's sister called him to report the patient's symptoms. Bruce states the patient will not be honest with him.  Ms. Aguilar's daughter told Bruce the patient was not happy with her Cardiology visit 3/10. She told him the patient complained of swelling in her ankles and feet, dizziness, and lethargy for several days. He thinks her SOB may be a little worse than usual and she is extremely anxious. She also told Bruce Ms. Hoar has been "talking out of her head." Ms. Mozer is saying things like "I'm just at the end of my rope." Bruce states he knows she is depressed but would not harm herself. He is more concerned "the end is near."  Bruce states sewer improvements are occurring in her neighborhood and her yard is all ripped up. She is extremely angry and frustrated over that and the repairs that will need to be made.  Her most recent BP was 109/57. HR 101.  Bruce states he knows she is not taking her medications as directed.   Bruce states he does not think Ms. Staib is in acute distress, but requests Dr. Burt Knack call the patient next week to check on her because she will listen to Dr. Burt Knack if he tells her to come in for an appointment or go to the hospital for treatment.   Called Ms. Kruczek to check on her. She states her dizziness is just "driving her insane" but otherwise feels pretty OK. She has not been taking her meclizine. Encouraged her to take as needed since she reported it worked well for her. She only stopped taking it because the dizziness went away. She was grateful for call.

## 2018-12-13 NOTE — Telephone Encounter (Signed)
° °  Patient's son Darnell Level calling to report dizziness. The son is in [place] and feels his mom is not doing well.   Requesting to speak with Dr Burt Knack

## 2018-12-16 NOTE — Telephone Encounter (Signed)
Hannah French states she is taking her meclizine and is feeling a little better, but would still like to talk with Dr. Burt Knack. She said her HR hovering around 100 is bothering her. She states she does not feel comfortable doing a Webex visit, but requests Dr. Burt Knack to call her. Virtual visit scheduled Wednesday afternoon.  She was grateful for call and agrees with treatment plan.

## 2018-12-16 NOTE — Telephone Encounter (Signed)
Please arrange a Webex or phone visit. thanks

## 2018-12-18 ENCOUNTER — Encounter: Payer: Self-pay | Admitting: Cardiovascular Disease

## 2018-12-18 ENCOUNTER — Other Ambulatory Visit: Payer: Self-pay

## 2018-12-18 ENCOUNTER — Telehealth (INDEPENDENT_AMBULATORY_CARE_PROVIDER_SITE_OTHER): Payer: Medicare Other | Admitting: Cardiovascular Disease

## 2018-12-18 VITALS — BP 106/63 | HR 100 | Wt 122.0 lb

## 2018-12-18 DIAGNOSIS — I447 Left bundle-branch block, unspecified: Secondary | ICD-10-CM

## 2018-12-18 DIAGNOSIS — R42 Dizziness and giddiness: Secondary | ICD-10-CM | POA: Diagnosis not present

## 2018-12-18 DIAGNOSIS — I5022 Chronic systolic (congestive) heart failure: Secondary | ICD-10-CM

## 2018-12-18 NOTE — Progress Notes (Signed)
Virtual Visit via Telephone Note    Evaluation Performed:  Follow-up visit  This visit type was conducted due to national recommendations for restrictions regarding the COVID-19 Pandemic (e.g. social distancing).  This format is felt to be most appropriate for this patient at this time.  All issues noted in this document were discussed and addressed.  No physical exam was performed (except for noted visual exam findings with Video Visits).  Please refer to the patient's chart (MyChart message for video visits and phone note for telephone visits) for the patient's consent to telehealth for [facility].  Date:  12/18/2018   ID:  Hannah French, DOB Aug 12, 1941, MRN [id number]  Patient Location:  [place] [address]   Provider location:   [place]  PCP:  Dettinger, Fransisca Kaufmann, MD  Cardiologist:  Sherren Mocha, MD  Electrophysiologist:  None   Chief Complaint:  Dizziness  History of Present Illness:    Hannah French is a 78 y.o. female who presents via audio/video conferencing for a telehealth visit today.    The patient does not symptoms concerning for COVID-19 infection (fever, chills, cough, or new SHORTNESS OF BREATH).   The patient is very dizzy. Meclizine is not helping her. She complains of difficulty with balance. Sometimes she has problems with dizziness when sitting and she is having presyncope on occasion.  She feels like her symptoms started a few weeks after she was initiated on medical therapy for cardiomyopathy.  She has not had orthopnea, PND, or leg swelling.  She denies chest pain.  She does admit to exertional dyspnea.  She has been very careful to avoid going outside or having any exposures since the COVID-19 pandemic became a concern.  Prior CV studies:   The following studies were reviewed today:  2D echocardiogram 08/28/2018: Study Conclusions  - Left ventricle: The cavity size was normal. There was mild   concentric  hypertrophy. Systolic function was moderately to   severely reduced. The estimated ejection fraction was in the   range of 30% to 35%. Diffuse hypokinesis. Features are consistent   with a pseudonormal left ventricular filling pattern, with   concomitant abnormal relaxation and increased filling pressure   (grade 2 diastolic dysfunction). Doppler parameters are   consistent with elevated ventricular end-diastolic filling   pressure. - Ventricular septum: Septal motion showed paradox. - Aortic valve: There was no regurgitation. - Mitral valve: There was mild regurgitation. - Right ventricle: The cavity size was normal. Wall thickness was   normal. Systolic function was normal. - Tricuspid valve: There was mild regurgitation. - Pericardium, extracardiac: There was no pericardial effusion.  Impressions:  - Since the last study on 03/07/2016 there has been a significant   change, LVEF has decreased from 60-65% to 30% with diffuse   hypokinesis and paradoxical septal motion. Filling pressures are   elevated.  Cardiac catheterization 09/16/2018: Conclusion     Prox LAD lesion is 40% stenosed.   1.  Nonobstructive coronary artery disease with diffuse luminal irregularities involving the left main, left circumflex, and RCA.  40 to 50% calcific stenosis of the proximal LAD is noted. 2.  Known severe LV systolic dysfunction by echo assessment 3.  Mild pulmonary hypertension and mild elevation of LVEDP 4.  Preserved cardiac output  Recommend: Medical therapy for congestive heart failure and nonobstructive CAD.  Will add losartan 25 mg to her medical regimen.  Will need to follow renal function closely and  will draw metabolic panel in about 2 weeks.     Past Medical History:  Diagnosis Date  . Abdominal bruit   . Bladder cancer ([place])   . Carotid bruit   . Diabetes mellitus    Type II  . Dyslipidemia   . Heart murmur   . Mitral valve prolapse   . Osteoporosis   .  Pancreatitis, acute   . Pseudogout   . PVD (peripheral vascular disease) ([place])    Past Surgical History:  Procedure Laterality Date  . APPENDECTOMY    . Hysterectomy-type unspecified    . LAPAROSCOPIC CHOLECYSTECTOMY  2009  . RIGHT/LEFT HEART CATH AND CORONARY ANGIOGRAPHY N/A 09/16/2018   Procedure: RIGHT/LEFT HEART CATH AND CORONARY ANGIOGRAPHY;  Surgeon: Sherren Mocha, MD;  Location: [facility];  Service: Cardiovascular;  Laterality: N/A;     Current Meds  Medication Sig  . Calcium-Vitamin D-Vitamin K (CALCIUM + D + K PO) Take 1 tablet by mouth every other day.  . Cholecalciferol (VITAMIN D) 50 MCG (2000 UT) tablet Take 6,000 Units by mouth daily.  . furosemide (LASIX) 20 MG tablet Take 1 tablet (20 mg total) by mouth daily. You may take an extra dose for increase weight gain or swelling  . glimepiride (AMARYL) 4 MG tablet TAKE (1) TABLET DAILY AS DIRECTED.  Marland Kitchen ibuprofen (ADVIL,MOTRIN) 200 MG tablet Take 400 mg by mouth 2 (two) times daily as needed (for pain).   . metoprolol succinate (TOPROL-XL) 25 MG 24 hr tablet Take 25 mg by mouth daily.  Glory Rosebush DELICA LANCETS 16X MISC USE   TO CHECK GLUCOSE TWICE DAILY AND AS NEEDED  . ONETOUCH VERIO test strip USE TO CHECK BLOOD GLUCOSE TWICE DAILY AND AS NEEDED  . vitamin B-12 (CYANOCOBALAMIN) 1000 MCG tablet Take 1,000 mcg by mouth daily.  . [DISCONTINUED] losartan (COZAAR) 25 MG tablet Take 1 tablet (25 mg total) by mouth daily.     Allergies:   Contrast media [iodinated diagnostic agents]; Iohexol; Metformin; Pravastatin; Albuterol; Bextra [valdecoxib]; Cefdinir; Nsaids; Statins; Sulfa antibiotics; Zetia [ezetimibe]; and Rofecoxib   Social History   Tobacco Use  . Smoking status: Current Every Day Smoker    Packs/day: 1.00    Years: 30.00    Pack years: 30.00    Types: Cigarettes  . Smokeless tobacco: Never Used  Substance Use Topics  . Alcohol use: No  . Drug use: No     Family Hx: [name] family history  includes Heart attack in her brother and maternal uncle; Heart failure in her mother; Hypertension in her brother and sister. There is no history of Coronary artery disease.  ROS:   Please see the history of present illness.    All other systems reviewed and are negative.   Labs/Other Tests and Data Reviewed:    Recent Labs: 08/13/2018: B Natriuretic Peptide 202.7; Magnesium 1.7 10/04/2018: NT-Pro BNP 4,124 10/19/2018: ALT 15; BUN 21; Creatinine, Ser 0.91; Hemoglobin 11.6; Platelets 176; Potassium 4.1; Sodium 136   Recent Lipid Panel Lab Results  Component Value Date/Time   CHOL 170 03/20/2018 08:37 AM   TRIG 88 03/20/2018 08:37 AM   TRIG 82 06/09/2014 10:28 AM   HDL 47 03/20/2018 08:37 AM   HDL 57 06/09/2014 10:28 AM   CHOLHDL 3.6 03/20/2018 08:37 AM   CHOLHDL 3.5 02/06/2013 09:32 AM   LDLCALC 105 (H) 03/20/2018 08:37 AM   LDLCALC 131 (H) 06/09/2014 10:28 AM    Wt Readings from Last 3 Encounters:  12/18/18 122  lb (55.3 kg)  12/03/18 121 lb 6.4 oz (55.1 kg)  10/19/18 121 lb (54.9 kg)     Exam:    Vital Signs:  BP 106/63   Pulse 100   Wt 122 lb (55.3 kg)   BMI 23.75 kg/m    Physical exam not performed as this is a telemetry medicine visit via the phone.  ASSESSMENT & PLAN:    1.  Chronic systolic heart failure: The patient appears to have stable New York Heart Association functional class IIb symptoms.  I am suspicious that her dizziness and lightheadedness are related to medical therapy for heart failure.  I recommended that she hold losartan.  She will continue on metoprolol succinate 25 mg daily.  No other changes are made in her medical regimen today.  I asked her to contact us if her symptoms do not improve within a week of stopping losartan.  2.  Tobacco abuse: Longstanding, not ready to quit  3.  Dizziness: As above, will see if symptoms resolve with holding losartan.  If not she may require outpatient telemetry monitoring.  Would also be ideal to bring her in  for orthostatic blood pressures.  We will have to determine urgency based on the current Covid-19 pandemic.  She prefers to avoid any exposure which seems like a good idea. COVID-19 Education: The signs and symptoms of COVID-19 were discussed with the patient and how to seek care for testing (follow up with PCP or arrange E-visit).  The importance of social distancing was discussed today.  Patient Risk:   After full review of this patients clinical status, I feel that they are at least moderate risk at this time.  Time:   Today, I have spent 14 minutes with the patient with telehealth technology discussing issues above  Summary: The patient is asked to discontinue losartan in order to see if this is the etiology of her dizziness.  She is scheduled for a repeat echocardiogram and office visit on June 12 which we will keep on the schedule for now.     Medication Adjustments/Labs and Tests Ordered: Current medicines are reviewed at length with the patient today.  Concerns regarding medicines are outlined above.  Tests Ordered: No orders of the defined types were placed in this encounter.  Medication Changes: No orders of the defined types were placed in this encounter.   Disposition:  as scheduled  Signed, Sherren Mocha, MD  12/18/2018 2:28 PM    [place]

## 2018-12-24 DIAGNOSIS — R42 Dizziness and giddiness: Secondary | ICD-10-CM | POA: Diagnosis not present

## 2018-12-24 DIAGNOSIS — R000 Tachycardia, unspecified: Secondary | ICD-10-CM | POA: Diagnosis not present

## 2019-01-01 ENCOUNTER — Telehealth: Payer: Self-pay | Admitting: Cardiovascular Disease

## 2019-01-01 DIAGNOSIS — I5022 Chronic systolic (congestive) heart failure: Secondary | ICD-10-CM

## 2019-01-01 MED ORDER — IVABRADINE HCL 5 MG PO TABS: 5.0000 mg | ORAL_TABLET | Freq: Two times a day (BID) | ORAL | 3 refills | Status: DC

## 2019-01-01 NOTE — Telephone Encounter (Signed)
Pt called to report that she has been having an elevated HR on her BP cuff monitor..  She says the last 5 readings have been:   108... 100/52 109.Marland KitchenMarland KitchenMarland Kitchen 101/68 100.Marland KitchenMarland KitchenMarland KitchenMarland Kitchen103/60 100....120/60 110.....105/60 (today)  She reports that she has been feeling much better since stopping the Losartan but her HR is up.  She says that these readings have all neem first thing on the morning before taking her meds. However, she says that since her 12/18/18 televisit with Dr. Burt Knack it has been between 100-110 even after taking her Metoprolol but she does not have those readings but can pull them off her machine if she needs to.   She denies dizziness, palpitations, sob, and says she has generally felt better.   I advised her to go ahead and take her Meds and should recheck in a few hours.. I advised her to avoid caffeine and she says she will try. Pt does not want me to send this to any other provider such as abn APP only to Dr. Burt Knack, but I explained to her that he is not in the office and with her HR staying high I would need to get advice from someone form her team and she agreed.

## 2019-01-01 NOTE — Telephone Encounter (Signed)
Application sent to pt as requested ./cy

## 2019-01-01 NOTE — Telephone Encounter (Signed)
Message received from Triage concerning her HR and BP.   I called the patient and spoke with her at length about her options. She is asymptomatic with HR that are routinely below 110.  I suggested she could try to take and extra one-half tablet of toprol at night. She does not want to do that. I do not want to decrease her lasix. I suggested we could try corlanor. This was prescribed for her previously but was $100 to pick up a 30 day supply. She states she is feeling so bad that she will try one month at this cost.   I have messaged the pharmacy staff. They suggested the patient fill out patient assistance forms: Environmental manager.   I have requested the onsite triage nurse to print this and mail to the patient. I have sent the patient a MyChart message letting her know that we have sent this paperwork.

## 2019-01-01 NOTE — Telephone Encounter (Signed)
  STAT if HR is under 50 or over 120 (normal HR is 60-100 beats per minute)  1) What is your heart rate? 110 right now  2) Do you have a log of your heart rate readings (document readings)? Yes, 108, 109,   3) Do you have any other symptoms?   Patient is calling because she feels that she needs some medication to lower her heart rate. [place]

## 2019-01-01 NOTE — Telephone Encounter (Signed)
  Please see previous note. System crashed before I could route message.

## 2019-01-01 NOTE — Telephone Encounter (Signed)
Follow up   Pt is returning call that got disconnected  HR readings 108, 109, 100,100 This morning its 110   No other symptoms and BP is good   Please call

## 2019-01-08 ENCOUNTER — Other Ambulatory Visit: Payer: Self-pay

## 2019-01-13 ENCOUNTER — Telehealth: Payer: Self-pay | Admitting: Cardiovascular Disease

## 2019-01-13 NOTE — Telephone Encounter (Signed)
There really are no other medication options to slow her heart rate since we have primarily been limited by low blood pressure.  Initially I considered increasing metoprolol succinate to 37.5 mg daily, but she has not tolerated this in the past because of symptomatic hypotension.  For now I would just keep an eye on things and keep taking the 25 mg dose of metoprolol succinate as she is doing.

## 2019-01-13 NOTE — Telephone Encounter (Signed)
New Message:    Patient calling concerning her condition and did not want to state what is going on with her and would like to speak with a patient. Please call patient.

## 2019-01-13 NOTE — Telephone Encounter (Signed)
Hannah French was recently prescribed Corlanor and cannot afford it. Patient assistance paperwork was mailed to her and she does have it in her possession.  She states she does not want to fill out the paperwork because it would take too long and then the turnaround time would be too long as well.  She wants Dr. Antionette Char recommendations on another medication to take instead of Corlanor she can afford without filling out paperwork.

## 2019-01-14 MED ORDER — METOPROLOL SUCCINATE ER 25 MG PO TB24: 37.5000 mg | ORAL_TABLET | Freq: Every day | ORAL | Status: DC

## 2019-01-14 NOTE — Telephone Encounter (Signed)
Discussed with Dr. Burt Knack. Instructed Hannah French to INCREASE TOPROL to 1.5 tablets daily. She will continue to monitor BP and call if symptoms of low BP occur.  Encouraged her to fill out paperwork for Corlanor. She states she might and if she does she'll notify the office.

## 2019-01-14 NOTE — Telephone Encounter (Signed)
**Note De-Identified  Obfuscation** I called the pts pharmacy to find out how much Corlanor will cost the pt for a 30 day supply. I was advised that the pts plan does not cover Corlanor and that a PA is required.  I have done a Corlanor PA through covermymeds in hopes of gaining coverage for the pts medication. Key: [id number]

## 2019-01-14 NOTE — Addendum Note (Signed)
Addended by: Harland German A on: 01/14/2019 01:32 PM   Modules accepted: Orders

## 2019-01-15 ENCOUNTER — Telehealth: Payer: Self-pay | Admitting: *Deleted

## 2019-01-15 NOTE — Telephone Encounter (Signed)
Pt called related to drug testing performed in ED.  She was offended that "a 78 year old woman was tested without their knowledge.'  EDCM offered phone number to patient experience.

## 2019-01-15 NOTE — Telephone Encounter (Signed)
**Note De-Identified  Obfuscation** We received a letter from [employer] stating that they have approved the pts Colanor PA. Approval good until 09/25/2019  I called the pts pharmacy to advise them of this approval and they verified that the pts Corlanor did go through but that the pts co-pay will be $150 for a 30 day supply as the pt has not met her deductible.  I called the pt and attempted to explain why Corlanor is at the price it is. The pt is very unfamiliar with her insurance plan and does not know what her deductible is.  She states that she is going to call her insurance company to ask the questions that I cannot answer for her.  Also, she is advised to contact [employer] pt asst program (the phone number is listed on the front of the application that Dr York Cerise nurse provided her with) as she has some questions to ask before completing the lengthy application.  She states that she will call me back once she has s/w her insurance and [employer] to let me know what she finds out and what she wants to do.

## 2019-01-29 ENCOUNTER — Other Ambulatory Visit: Payer: Self-pay

## 2019-01-29 ENCOUNTER — Encounter: Payer: Self-pay | Admitting: Family Medicine

## 2019-01-29 ENCOUNTER — Ambulatory Visit (INDEPENDENT_AMBULATORY_CARE_PROVIDER_SITE_OTHER): Payer: Medicare Other | Admitting: Family Medicine

## 2019-01-29 VITALS — BP 109/70 | HR 101 | Temp 97.1°F | Ht 60.1 in | Wt 122.4 lb

## 2019-01-29 DIAGNOSIS — E1169 Type 2 diabetes mellitus with other specified complication: Secondary | ICD-10-CM | POA: Diagnosis not present

## 2019-01-29 DIAGNOSIS — J441 Chronic obstructive pulmonary disease with (acute) exacerbation: Secondary | ICD-10-CM

## 2019-01-29 DIAGNOSIS — E559 Vitamin D deficiency, unspecified: Secondary | ICD-10-CM

## 2019-01-29 DIAGNOSIS — E785 Hyperlipidemia, unspecified: Secondary | ICD-10-CM | POA: Diagnosis not present

## 2019-01-29 DIAGNOSIS — J439 Emphysema, unspecified: Secondary | ICD-10-CM | POA: Diagnosis not present

## 2019-01-29 LAB — BAYER DCA HB A1C WAIVED: HB A1C (BAYER DCA - WAIVED): 7.7 % — ABNORMAL HIGH (ref <7.0)

## 2019-01-29 MED ORDER — GLUCOSE BLOOD VI STRP: ORAL_STRIP | 3 refills | Status: DC

## 2019-01-29 MED ORDER — FLUTICASONE-SALMETEROL 100-50 MCG/DOSE IN AEPB: 1.0000 | INHALATION_SPRAY | Freq: Two times a day (BID) | RESPIRATORY_TRACT | 3 refills | Status: DC

## 2019-01-29 MED ORDER — METOPROLOL SUCCINATE ER 25 MG PO TB24: 37.5000 mg | ORAL_TABLET | Freq: Every day | ORAL | Status: DC

## 2019-01-29 MED ORDER — GLIMEPIRIDE 2 MG PO TABS: 2.0000 mg | ORAL_TABLET | Freq: Every day | ORAL | 3 refills | Status: DC

## 2019-01-29 MED ORDER — FUROSEMIDE 20 MG PO TABS: 20.0000 mg | ORAL_TABLET | Freq: Every day | ORAL | 3 refills | Status: DC

## 2019-01-29 NOTE — Progress Notes (Signed)
BP 109/70   Pulse (!) 101   Temp (!) 97.1 F (36.2 C) (Oral)   Ht 5' 0.1" (1.527 m)   Wt 122 lb 6.4 oz (55.5 kg)   BMI 23.83 kg/m    Subjective:   Patient ID: Hannah French, female    DOB: [birth year], 78 y.o.   MRN: [id number]  HPI: Hannah French is a 78 y.o. female presenting on 01/29/2019 for Medical Management of Chronic Issues (check up of chronic medical conditions) and COPD   HPI COPD Patient is coming in for COPD recheck today.  He is currently on Advair.  He has a mild chronic cough but denies any major coughing spells or wheezing spells.  He has 2nighttime symptoms per week and 3daytime symptoms per week currently.  She says she is about her baseline but feels like her congestive heart failure is why she is more short of breath  Type 2 diabetes mellitus Patient comes in today for recheck of his diabetes. Patient has been currently taking glimepiride 2 mg, she has been cutting her 4 mg in half because she was getting low. Patient is not currently on an ACE inhibitor/ARB because of hypotension. Patient has not seen an ophthalmologist this year. Patient denies any new issues with their feet.   Hyperlipidemia Patient is coming in for recheck of his hyperlipidemia. The patient is currently taking no medication currently and we are monitoring and managed by cardiology currently. They deny any issues with myalgias or history of liver damage from it. They deny any focal numbness or weakness or chest pain.   Patient has CHF that is managed by cardiology and she is currently taking furosemide and metoprolol for this and says that she had gained weight last week and had been having a little more short of breath and doubled up on her Lasix over the past couple days and the weight has come back down.  She was originally up 3 pounds but now she is back down 2 pounds and she is keeping an eye on that.  We will recheck her kidneys today to make sure they are doing well but she  says her shortness of breath is improving and her leg swelling is still there but feels like it is getting better.  Vitamin D deficiency Patient is coming in for vitamin D deficiency recheck.  She is still taking the supplement and has not had it checked checked today.  Relevant past medical, surgical, family and social history reviewed and updated as indicated. Interim medical history since our last visit reviewed. Allergies and medications reviewed and updated.  Review of Systems  Constitutional: Negative for chills and fever.  HENT: Negative for congestion, ear pain, sinus pressure, sinus pain, sneezing and sore throat.   Eyes: Negative for visual disturbance.  Respiratory: Positive for shortness of breath. Negative for cough and chest tightness.   Cardiovascular: Positive for leg swelling. Negative for chest pain.  Genitourinary: Negative for difficulty urinating and dysuria.  Musculoskeletal: Negative for back pain and gait problem.  Skin: Negative for rash.  Neurological: Negative for light-headedness and headaches.  Psychiatric/Behavioral: Negative for agitation and behavioral problems.  All other systems reviewed and are negative.   Per HPI unless specifically indicated above   Allergies as of 01/29/2019      Reactions   Contrast Media [iodinated Diagnostic Agents] Shortness Of Breath   Iohexol Other (See Comments)   PASSED OUT DURING THE TEST   Metformin Swelling  Face and hands became swollen   Pravastatin Swelling, Other (See Comments)   Myalgia, also   Albuterol Other (See Comments)   "Takes the skin off of the inside of my mouth"   Bextra [valdecoxib] Nausea And Vomiting, Swelling   Shut down my kidneys    Cefdinir Other (See Comments)   Pancreatitis   Cozaar [losartan Potassium] Other (See Comments)   dizziness   Nsaids Other (See Comments)   GI intolerance and pain all over, tolerates ibu   Statins    They make the patient hurt "all over"   Sulfa  Antibiotics Nausea And Vomiting, Other (See Comments)   Pancreatitis   Zetia [ezetimibe] Swelling   Rofecoxib Other (See Comments)   "VIOXX"- "SHUT DOWN MY KIDNEYS"      Medication List       Accurate as of Jan 29, 2019  9:09 AM. Always use your most recent med list.        CALCIUM + D + K PO Take 1 tablet by mouth every other day.   Fluticasone-Salmeterol 100-50 MCG/DOSE Aepb Commonly known as:  ADVAIR Inhale 1 puff into the lungs 2 (two) times daily.   furosemide 20 MG tablet Commonly known as:  LASIX Take 1 tablet (20 mg total) by mouth daily. You may take an extra dose for increase weight gain or swelling   glimepiride 2 MG tablet Commonly known as:  AMARYL Take 1-2 tablets (2-4 mg total) by mouth daily with breakfast.   glucose blood test strip Commonly known as:  OneTouch Verio USE TO CHECK BLOOD GLUCOSE TWICE DAILY AND AS NEEDED   ibuprofen 200 MG tablet Commonly known as:  ADVIL Take 400 mg by mouth 2 (two) times daily as needed (for pain).   ivabradine 5 MG Tabs tablet Commonly known as:  Corlanor Take 1 tablet (5 mg total) by mouth 2 (two) times daily with a meal.   meclizine 12.5 MG tablet Commonly known as:  ANTIVERT Take 1 tablet (12.5 mg total) by mouth 3 (three) times daily as needed for dizziness.   metoprolol succinate 25 MG 24 hr tablet Commonly known as:  TOPROL-XL Take 1.5 tablets (37.5 mg total) by mouth daily.   OneTouch Delica Lancets 53Z Misc USE   TO CHECK GLUCOSE TWICE DAILY AND AS NEEDED   vitamin B-12 1000 MCG tablet Commonly known as:  CYANOCOBALAMIN Take 1,000 mcg by mouth daily.   Vitamin D 50 MCG (2000 UT) tablet Take 6,000 Units by mouth daily.        Objective:   BP 109/70   Pulse (!) 101   Temp (!) 97.1 F (36.2 C) (Oral)   Ht 5' 0.1" (1.527 m)   Wt 122 lb 6.4 oz (55.5 kg)   BMI 23.83 kg/m   Wt Readings from Last 3 Encounters:  01/29/19 122 lb 6.4 oz (55.5 kg)  12/18/18 122 lb (55.3 kg)  12/03/18 121 lb 6.4  oz (55.1 kg)    Physical Exam Vitals signs and nursing note reviewed.  Constitutional:      General: She is not in acute distress.    Appearance: She is well-developed. She is not diaphoretic.  Eyes:     Conjunctiva/sclera: Conjunctivae normal.  Cardiovascular:     Rate and Rhythm: Normal rate and regular rhythm.     Heart sounds: Normal heart sounds. No murmur.  Pulmonary:     Effort: Pulmonary effort is normal. No respiratory distress.     Breath sounds: No stridor. Wheezing (  End expiratory) present. No rales.  Musculoskeletal: Normal range of motion.        General: Swelling (Trace bilateral lower extremity edema) present. No tenderness.  Skin:    General: Skin is warm and dry.     Findings: No rash.  Neurological:     Mental Status: She is alert and oriented to person, place, and time.     Coordination: Coordination normal.  Psychiatric:        Behavior: Behavior normal.      Assessment & Plan:   Problem List Items Addressed This Visit      Respiratory   COPD (chronic obstructive pulmonary disease) (HCC)   Relevant Medications   Fluticasone-Salmeterol (ADVAIR) 100-50 MCG/DOSE AEPB     Endocrine   Hyperlipidemia associated with type 2 diabetes mellitus (HCC)   Relevant Medications   glimepiride (AMARYL) 2 MG tablet   Other Relevant Orders   Lipid panel   Type 2 diabetes mellitus ([place]) - Primary   Relevant Medications   glimepiride (AMARYL) 2 MG tablet   glucose blood (ONETOUCH VERIO) test strip   Other Relevant Orders   CMP14+EGFR   Bayer DCA Hb A1c Waived     Other   Vitamin D deficiency   Relevant Orders   VITAMIN D 25 Hydroxy (Vit-D Deficiency, Fractures)    Other Visit Diagnoses    COPD exacerbation (HCC)       Relevant Medications   Fluticasone-Salmeterol (ADVAIR) 100-50 MCG/DOSE AEPB   Other Relevant Orders   CBC with Differential/Platelet      Continue current medication, will check renal function because of the increased  Follow up plan:  Return in about 3 months (around 05/01/2019), or if symptoms worsen or fail to improve, for Diabetes and COPD and hyperlipidemia recheck.  Counseling provided for all of the vaccine components Orders Placed This Encounter  Procedures  . CBC with Differential/Platelet  . CMP14+EGFR  . Lipid panel  . Bayer DCA Hb A1c Waived  . VITAMIN D 25 Hydroxy (Vit-D Deficiency, Fractures)    Caryl Pina, MD [facility] 01/29/2019, 9:09 AM

## 2019-01-30 DIAGNOSIS — M25562 Pain in left knee: Secondary | ICD-10-CM | POA: Diagnosis not present

## 2019-01-30 DIAGNOSIS — M25561 Pain in right knee: Secondary | ICD-10-CM | POA: Diagnosis not present

## 2019-01-30 LAB — LIPID PANEL
Chol/HDL Ratio: 3.7 ratio (ref 0.0–4.4)
Cholesterol, Total: 155 mg/dL (ref 100–199)
HDL: 42 mg/dL (ref >39)
LDL Calculated: 93 mg/dL (ref 0–99)
Triglycerides: 100 mg/dL (ref 0–149)
VLDL Cholesterol Cal: 20 mg/dL (ref 5–40)

## 2019-01-30 LAB — CBC WITH DIFFERENTIAL/PLATELET
Basophils Absolute: 0.1 10*3/uL (ref 0.0–0.2)
Basos: 1 %
EOS (ABSOLUTE): 0.2 10*3/uL (ref 0.0–0.4)
Eos: 2 %
Hematocrit: 35.5 % (ref 34.0–46.6)
Hemoglobin: 12 g/dL (ref 11.1–15.9)
Immature Grans (Abs): 0 10*3/uL (ref 0.0–0.1)
Immature Granulocytes: 1 %
Lymphocytes Absolute: 1.5 10*3/uL (ref 0.7–3.1)
Lymphs: 20 %
MCH: 33.1 pg — ABNORMAL HIGH (ref 26.6–33.0)
MCHC: 33.8 g/dL (ref 31.5–35.7)
MCV: 98 fL — ABNORMAL HIGH (ref 79–97)
Monocytes Absolute: 0.5 10*3/uL (ref 0.1–0.9)
Monocytes: 7 %
Neutrophils Absolute: 5.3 10*3/uL (ref 1.4–7.0)
Neutrophils: 69 %
Platelets: 306 10*3/uL (ref 150–450)
RBC: 3.62 x10E6/uL — ABNORMAL LOW (ref 3.77–5.28)
RDW: 13.8 % (ref 11.7–15.4)
WBC: 7.7 10*3/uL (ref 3.4–10.8)

## 2019-01-30 LAB — CMP14+EGFR
ALT: 5 IU/L (ref 0–32)
AST: 14 IU/L (ref 0–40)
Albumin/Globulin Ratio: 1.3 (ref 1.2–2.2)
Albumin: 3.9 g/dL (ref 3.7–4.7)
Alkaline Phosphatase: 63 IU/L (ref 39–117)
BUN/Creatinine Ratio: 17 (ref 12–28)
BUN: 18 mg/dL (ref 8–27)
Bilirubin Total: 1 mg/dL (ref 0.0–1.2)
CO2: 27 mmol/L (ref 20–29)
Calcium: 9.5 mg/dL (ref 8.7–10.3)
Chloride: 96 mmol/L (ref 96–106)
Creatinine, Ser: 1.08 mg/dL — ABNORMAL HIGH (ref 0.57–1.00)
GFR calc Af Amer: 57 mL/min/{1.73_m2} — ABNORMAL LOW (ref >59)
GFR calc non Af Amer: 49 mL/min/{1.73_m2} — ABNORMAL LOW (ref >59)
Globulin, Total: 2.9 g/dL (ref 1.5–4.5)
Glucose: 152 mg/dL — ABNORMAL HIGH (ref 65–99)
Potassium: 4.1 mmol/L (ref 3.5–5.2)
Sodium: 141 mmol/L (ref 134–144)
Total Protein: 6.8 g/dL (ref 6.0–8.5)

## 2019-01-30 LAB — VITAMIN D 25 HYDROXY (VIT D DEFICIENCY, FRACTURES): Vit D, 25-Hydroxy: 57.2 ng/mL (ref 30.0–100.0)

## 2019-02-13 ENCOUNTER — Telehealth: Payer: Self-pay

## 2019-02-13 NOTE — Telephone Encounter (Signed)
**Note De-Identified  Obfuscation** The pt returned her [employer] Pt Asst application for Corlanor to the office. I have completed the MD part and, Dr Burt Knack has signed it and I have faxed the application to [employer]

## 2019-02-24 ENCOUNTER — Ambulatory Visit: Payer: Medicare Other | Admitting: Physician Assistant

## 2019-02-25 ENCOUNTER — Telehealth: Payer: Self-pay

## 2019-02-25 ENCOUNTER — Other Ambulatory Visit: Payer: Self-pay | Admitting: Family Medicine

## 2019-02-25 NOTE — Telephone Encounter (Signed)
The patient was scheduled for echo and OV with Dr. Burt Knack 6/12. Rescheduled echo to 6/11 and switched OV to virtual visit with Dr. Burt Knack.  The patient will have VS and meds ready for precall. Consent obtained for phone visit.      COVID-19 Pre-Screening Questions for echo appointment:  . In the past 7 to 10 days have you had a cough,  shortness of breath, headache, congestion, fever (100 or greater) body aches, chills, sore throat, or sudden loss of taste or sense of smell? NO . Have you been around anyone with known Covid 19. NO . Have you been around anyone who is awaiting Covid 19 test results in the past 7 to 10 days? NO . Have you been around anyone who has been exposed to Covid 19, or has mentioned symptoms of Covid 19 within the past 7 to 10 days? NO  If you have any concerns/questions about symptoms patients report during screening (either on the phone or at threshold). Contact the provider seeing the patient or DOD for further guidance.  If neither are available contact a member of the leadership team.    Virtual Visit Pre-Appointment Phone Call  Confirm consent - "In the setting of the current Covid19 crisis, you are scheduled for a (phone or video) visit with your provider on (date) at (time).  Just as we do with many in-office visits, in order for you to participate in this visit, we must obtain consent.  If you'd like, I can send this to your mychart (if signed up) or email for you to review.  Otherwise, I can obtain your verbal consent now.  All virtual visits are billed to your insurance company just like a normal visit would be.  By agreeing to a virtual visit, we'd like you to understand that the technology does not allow for your provider to perform an examination, and thus may limit your provider's ability to fully assess your condition. If your provider identifies any concerns that need to be evaluated in person, we will make arrangements to do so.  Finally, though the  technology is pretty good, we cannot assure that it will always work on either your or our end, and in the setting of a video visit, we may have to convert it to a phone-only visit.  In either situation, we cannot ensure that we have a secure connection.  Are you willing to proceed?" STAFF: Did the patient verbally acknowledge consent to telehealth visit? Document YES/NO here: YES   TELEPHONE CALL NOTE  Hannah French has been deemed a candidate for a follow-up tele-health visit to limit community exposure during the Covid-19 pandemic. I spoke with the patient via phone to ensure availability of phone/video source, confirm preferred email & phone number, and discuss instructions and expectations.  I reminded Hannah French to be prepared with any vital sign and/or heart rhythm information that could potentially be obtained via home monitoring, at the time of her visit. I reminded Hannah French to expect a phone call prior to her visit.

## 2019-03-03 ENCOUNTER — Other Ambulatory Visit: Payer: Self-pay

## 2019-03-03 ENCOUNTER — Ambulatory Visit: Payer: Medicare Other | Admitting: Family Medicine

## 2019-03-03 ENCOUNTER — Telehealth: Payer: Self-pay | Admitting: Cardiovascular Disease

## 2019-03-03 NOTE — Telephone Encounter (Signed)
Ms. Folkes states she choked and broke some ribs while trying to expel the food. She rescheduled her echo from 6/12 to 8/3 to give herself some healing time.  She states she is feeling fine from a cardiac perspective - her HR is lower and she has no dizziness.  She states she will reschedule follow-up with Dr. Burt Knack after her echo if she feels she needs it at that time. She was grateful for assistance.

## 2019-03-03 NOTE — Telephone Encounter (Signed)
New Message   Patient has rescheduled her echocardiogram to August due to injury. She wants to know does she need to reschedule her appt with Dr. Burt Knack as well. Please call to discuss.

## 2019-03-06 ENCOUNTER — Other Ambulatory Visit (HOSPITAL_COMMUNITY): Payer: Medicare Other

## 2019-03-07 ENCOUNTER — Other Ambulatory Visit (HOSPITAL_COMMUNITY): Payer: Medicare Other

## 2019-03-07 ENCOUNTER — Telehealth: Payer: Medicare Other | Admitting: Cardiovascular Disease

## 2019-03-07 ENCOUNTER — Other Ambulatory Visit: Payer: Self-pay

## 2019-03-10 ENCOUNTER — Other Ambulatory Visit: Payer: Self-pay

## 2019-03-10 ENCOUNTER — Ambulatory Visit (INDEPENDENT_AMBULATORY_CARE_PROVIDER_SITE_OTHER): Payer: Medicare Other

## 2019-03-10 ENCOUNTER — Encounter: Payer: Self-pay | Admitting: Family Medicine

## 2019-03-10 ENCOUNTER — Other Ambulatory Visit: Payer: Self-pay | Admitting: Family Medicine

## 2019-03-10 ENCOUNTER — Ambulatory Visit (INDEPENDENT_AMBULATORY_CARE_PROVIDER_SITE_OTHER): Payer: Medicare Other | Admitting: Family Medicine

## 2019-03-10 VITALS — BP 108/64 | HR 101 | Temp 97.5°F | Ht 60.1 in | Wt 121.8 lb

## 2019-03-10 DIAGNOSIS — S2243XA Multiple fractures of ribs, bilateral, initial encounter for closed fracture: Secondary | ICD-10-CM

## 2019-03-10 DIAGNOSIS — R0781 Pleurodynia: Secondary | ICD-10-CM

## 2019-03-10 NOTE — Patient Instructions (Signed)
Rib Fracture ° °A rib fracture is a break or crack in one of the bones of the ribs. The ribs are like a cage that goes around your upper chest. A broken or cracked rib is often painful, but most do not cause other problems. Most rib fractures usually heal on their own in 1-3 months. °Follow these instructions at home: °Managing pain, stiffness, and swelling °· If directed, apply ice to the injured area. °? Put ice in a plastic bag. °? Place a towel between your skin and the bag. °? Leave the ice on for 20 minutes, 2-3 times a day. °· Take over-the-counter and prescription medicines only as told by your doctor. °Activity °· Avoid activities that cause pain to the injured area. Protect your injured area. °· Slowly increase activity as told by your doctor. °General instructions °· Do deep breathing as told by your doctor. You may be told to: °? Take deep breaths many times a day. °? Cough many times a day while hugging a pillow. °? Use a device (incentive spirometer) to do deep breathing many times a day. °· Drink enough fluid to keep your pee (urine) clear or pale yellow. °· Do not wear a rib belt or binder. These do not allow you to breathe deeply. °· Keep all follow-up visits as told by your doctor. This is important. °Contact a doctor if: °· You have a fever. °Get help right away if: °· You have trouble breathing. °· You are short of breath. °· You cannot stop coughing. °· You cough up thick or bloody spit (sputum). °· You feel sick to your stomach (nauseous), throw up (vomit), or have belly (abdominal) pain. °· Your pain gets worse and medicine does not help. °Summary °· A rib fracture is a break or crack in one of the bones of the ribs. °· Apply ice to the injured area and take medicines for pain as told by your doctor. °· Take deep breaths and cough many times a day. Hug a pillow every time you cough. °This information is not intended to replace advice given to you by your health care provider. Make sure you  discuss any questions you have with your health care provider. °Document Released: 06/20/2008 Document Revised: 12/12/2016 Document Reviewed: 12/12/2016 °Elsevier Interactive Patient Education © 2019 [employer] Inc. ° °

## 2019-03-10 NOTE — Progress Notes (Signed)
Subjective:  Patient ID: Hannah French, female    DOB: 18-Aug-1941, 78 y.o.   MRN: [id number]  Chief Complaint:  Chest Pain   HPI: Hannah French is a 78 y.o. female presenting on 03/10/2019 for Chest Pain  Pt presents today for complaints of bilateral rib pain that radiates to her back. Pt states 2 weeks ago she choked on a peanut butter cracker and had to have the heimlich maneuvers performed on her. States she has had continued rib pain since this time. States she wanted to be seen last week but [facility] did not have [facility]. States she did not want to go to the ED. She has been managing the pain with tylenol and motrin. States the pain is sharp to shooting, 7/10 at worst. She states the pain is worse with cough and deep breathing. She denies increased shortness of breath from baseline. No fever, chills, weakness, confusion, or increased sputum production.   Relevant past medical, surgical, family, and social history reviewed and updated as indicated.  Allergies and medications reviewed and updated.   Past Medical History:  Diagnosis Date  . Abdominal bruit   . Bladder cancer ([place])   . Carotid bruit   . Diabetes mellitus    Type II  . Dyslipidemia   . Heart murmur   . Mitral valve prolapse   . Osteoporosis   . Pancreatitis, acute   . Pseudogout   . PVD (peripheral vascular disease) ([place])     Past Surgical History:  Procedure Laterality Date  . APPENDECTOMY    . Hysterectomy-type unspecified    . LAPAROSCOPIC CHOLECYSTECTOMY  2009  . RIGHT/LEFT HEART CATH AND CORONARY ANGIOGRAPHY N/A 09/16/2018   Procedure: RIGHT/LEFT HEART CATH AND CORONARY ANGIOGRAPHY;  Surgeon: Sherren Mocha, MD;  Location: [facility];  Service: Cardiovascular;  Laterality: N/A;    Social History   Socioeconomic History  . Marital status: Divorced    Spouse name: Not on file  . Number of children: 2  . Years of education: Not on file  . Highest education level: Not on file   Occupational History  . Occupation: Retired  Scientific laboratory technician  . Financial resource strain: Not on file  . Food insecurity    Worry: Not on file    Inability: Not on file  . Transportation needs    Medical: Not on file    Non-medical: Not on file  Tobacco Use  . Smoking status: Current Every Day Smoker    Packs/day: 1.00    Years: 30.00    Pack years: 30.00    Types: Cigarettes  . Smokeless tobacco: Never Used  Substance and Sexual Activity  . Alcohol use: No  . Drug use: No  . Sexual activity: Not on file  Lifestyle  . Physical activity    Days per week: Not on file    Minutes per session: Not on file  . Stress: Not on file  Relationships  . Social Herbalist on phone: Not on file    Gets together: Not on file    Attends religious service: Not on file    Active member of club or organization: Not on file    Attends meetings of clubs or organizations: Not on file    Relationship status: Not on file  . Intimate partner violence    Fear of current or ex partner: Not on file    Emotionally abused: Not on file  Physically abused: Not on file    Forced sexual activity: Not on file  Other Topics Concern  . Not on file  Social History Narrative  . Not on file    Outpatient Encounter Medications as of 03/10/2019  Medication Sig  . Calcium-Vitamin D-Vitamin K (CALCIUM + D + K PO) Take 1 tablet by mouth every other day.  . Cholecalciferol (VITAMIN D) 50 MCG (2000 UT) tablet Take 6,000 Units by mouth daily.  . Fluticasone-Salmeterol (ADVAIR) 100-50 MCG/DOSE AEPB Inhale 1 puff into the lungs 2 (two) times daily.  . furosemide (LASIX) 20 MG tablet Take 1 tablet (20 mg total) by mouth daily. You may take an extra dose for increase weight gain or swelling  . glimepiride (AMARYL) 2 MG tablet Take 1-2 tablets (2-4 mg total) by mouth daily with breakfast.  . glucose blood (ONETOUCH VERIO) test strip USE TO CHECK BLOOD GLUCOSE TWICE DAILY AND AS NEEDED  . ibuprofen  (ADVIL,MOTRIN) 200 MG tablet Take 400 mg by mouth 2 (two) times daily as needed (for pain).   . ivabradine (CORLANOR) 5 MG TABS tablet Take 1 tablet (5 mg total) by mouth 2 (two) times daily with a meal.  . meclizine (ANTIVERT) 12.5 MG tablet Take 1 tablet (12.5 mg total) by mouth 3 (three) times daily as needed for dizziness.  . metoprolol succinate (TOPROL-XL) 25 MG 24 hr tablet Take 1.5 tablets (37.5 mg total) by mouth daily.  Glory Rosebush DELICA LANCETS 36P MISC USE   TO CHECK GLUCOSE TWICE DAILY AND AS NEEDED  . vitamin B-12 (CYANOCOBALAMIN) 1000 MCG tablet Take 1,000 mcg by mouth daily.   No facility-administered encounter medications on file as of 03/10/2019.     Allergies  Allergen Reactions  . Contrast Media [Iodinated Diagnostic Agents] Shortness Of Breath  . Iohexol Other (See Comments)    PASSED OUT DURING THE TEST    . Metformin Swelling    Face and hands became swollen  . Pravastatin Swelling and Other (See Comments)    Myalgia, also  . Albuterol Other (See Comments)    "Takes the skin off of the inside of my mouth"  . Bextra [Valdecoxib] Nausea And Vomiting and Swelling    Shut down my kidneys   . Cefdinir Other (See Comments)    Pancreatitis   . Cozaar [Losartan Potassium] Other (See Comments)    dizziness  . Nsaids Other (See Comments)    GI intolerance and pain all over, tolerates ibu  . Statins     They make the patient hurt "all over"  . Sulfa Antibiotics Nausea And Vomiting and Other (See Comments)    Pancreatitis   . Zetia [Ezetimibe] Swelling  . Rofecoxib Other (See Comments)    "VIOXX"- "SHUT DOWN MY KIDNEYS"    Review of Systems  Constitutional: Negative for chills, diaphoresis, fatigue and fever.  Respiratory: Positive for cough. Negative for chest tightness, shortness of breath and wheezing.   Cardiovascular: Positive for chest pain (bialteral rib pain). Negative for palpitations and leg swelling.  Neurological: Negative for dizziness, syncope,  weakness, light-headedness and headaches.  Psychiatric/Behavioral: Negative for confusion.  All other systems reviewed and are negative.       Objective:  BP 108/64   Pulse (!) 101   Temp (!) 97.5 F (36.4 C) (Oral)   Ht 5' 0.1" (1.527 m)   Wt 121 lb 12.8 oz (55.2 kg)   BMI 23.71 kg/m    Wt Readings from Last 3 Encounters:  03/10/19 121  lb 12.8 oz (55.2 kg)  01/29/19 122 lb 6.4 oz (55.5 kg)  12/18/18 122 lb (55.3 kg)    Physical Exam Vitals signs and nursing note reviewed.  Constitutional:      General: She is not in acute distress.    Appearance: Normal appearance. She is well-developed and well-groomed. She is not ill-appearing, toxic-appearing or diaphoretic.  HENT:     Head: Normocephalic and atraumatic.     Jaw: There is normal jaw occlusion.     Right Ear: Hearing normal.     Left Ear: Hearing normal.     Nose: Nose normal.     Mouth/Throat:     Lips: Pink.     Mouth: Mucous membranes are moist.     Pharynx: Oropharynx is clear. Uvula midline.  Eyes:     General: Lids are normal.     Extraocular Movements: Extraocular movements intact.     Conjunctiva/sclera: Conjunctivae normal.     Pupils: Pupils are equal, round, and reactive to light.  Neck:     Musculoskeletal: Normal range of motion and neck supple.     Thyroid: No thyroid mass, thyromegaly or thyroid tenderness.     Vascular: No carotid bruit or JVD.     Trachea: Trachea and phonation normal.  Cardiovascular:     Rate and Rhythm: Normal rate and regular rhythm.     Chest Wall: PMI is not displaced.     Pulses: Normal pulses.     Heart sounds: Normal heart sounds. No murmur. No friction rub. No gallop.   Pulmonary:     Effort: Pulmonary effort is normal. No respiratory distress.     Breath sounds: Examination of the right-lower field reveals rhonchi. Examination of the left-lower field reveals rhonchi. Rhonchi (minimal) present. No wheezing.  Chest:     Chest wall: Tenderness (bilateral mid to  lower rib tenderness) present. No mass, lacerations, deformity, swelling, crepitus or edema. There is no dullness to percussion.  Abdominal:     General: Bowel sounds are normal. There is no distension or abdominal bruit.     Palpations: Abdomen is soft. There is no hepatomegaly or splenomegaly.     Tenderness: There is no abdominal tenderness. There is no right CVA tenderness or left CVA tenderness.     Hernia: No hernia is present.  Musculoskeletal: Normal range of motion.     Right lower leg: No edema.     Left lower leg: No edema.  Lymphadenopathy:     Cervical: No cervical adenopathy.  Skin:    General: Skin is warm and dry.     Capillary Refill: Capillary refill takes less than 2 seconds.     Coloration: Skin is not cyanotic, jaundiced or pale.     Findings: No rash.  Neurological:     General: No focal deficit present.     Mental Status: She is alert and oriented to person, place, and time.     Cranial Nerves: Cranial nerves are intact.     Sensory: Sensation is intact.     Motor: Motor function is intact.     Coordination: Coordination is intact.     Gait: Gait is intact.     Deep Tendon Reflexes: Reflexes are normal and symmetric.  Psychiatric:        Attention and Perception: Attention and perception normal.        Mood and Affect: Mood and affect normal.        Speech: Speech normal.  Behavior: Behavior normal. Behavior is cooperative.        Thought Content: Thought content normal.        Cognition and Memory: Cognition and memory normal.        Judgment: Judgment normal.     Results for orders placed or performed in visit on 01/29/19  CBC with Differential/Platelet  Result Value Ref Range   WBC 7.7 3.4 - 10.8 x10E3/uL   RBC 3.62 (L) 3.77 - 5.28 x10E6/uL   Hemoglobin 12.0 11.1 - 15.9 g/dL   Hematocrit 35.5 34.0 - 46.6 %   MCV 98 (H) 79 - 97 fL   MCH 33.1 (H) 26.6 - 33.0 pg   MCHC 33.8 31.5 - 35.7 g/dL   RDW 13.8 11.7 - 15.4 %   Platelets 306 150 - 450  x10E3/uL   Neutrophils 69 Not Estab. %   Lymphs 20 Not Estab. %   Monocytes 7 Not Estab. %   Eos 2 Not Estab. %   Basos 1 Not Estab. %   Neutrophils Absolute 5.3 1.4 - 7.0 x10E3/uL   Lymphocytes Absolute 1.5 0.7 - 3.1 x10E3/uL   Monocytes Absolute 0.5 0.1 - 0.9 x10E3/uL   EOS (ABSOLUTE) 0.2 0.0 - 0.4 x10E3/uL   Basophils Absolute 0.1 0.0 - 0.2 x10E3/uL   Immature Granulocytes 1 Not Estab. %   Immature Grans (Abs) 0.0 0.0 - 0.1 x10E3/uL  CMP14+EGFR  Result Value Ref Range   Glucose 152 (H) 65 - 99 mg/dL   BUN 18 8 - 27 mg/dL   Creatinine, Ser 1.08 (H) 0.57 - 1.00 mg/dL   GFR calc non Af Amer 49 (L) >59 mL/min/1.73   GFR calc Af Amer 57 (L) >59 mL/min/1.73   BUN/Creatinine Ratio 17 12 - 28   Sodium 141 134 - 144 mmol/L   Potassium 4.1 3.5 - 5.2 mmol/L   Chloride 96 96 - 106 mmol/L   CO2 27 20 - 29 mmol/L   Calcium 9.5 8.7 - 10.3 mg/dL   Total Protein 6.8 6.0 - 8.5 g/dL   Albumin 3.9 3.7 - 4.7 g/dL   Globulin, Total 2.9 1.5 - 4.5 g/dL   Albumin/Globulin Ratio 1.3 1.2 - 2.2   Bilirubin Total 1.0 0.0 - 1.2 mg/dL   Alkaline Phosphatase 63 39 - 117 IU/L   AST 14 0 - 40 IU/L   ALT 5 0 - 32 IU/L  Lipid panel  Result Value Ref Range   Cholesterol, Total 155 100 - 199 mg/dL   Triglycerides 100 0 - 149 mg/dL   HDL 42 >39 mg/dL   VLDL Cholesterol Cal 20 5 - 40 mg/dL   LDL Calculated 93 0 - 99 mg/dL   Chol/HDL Ratio 3.7 0.0 - 4.4 ratio  Bayer DCA Hb A1c Waived  Result Value Ref Range   HB A1C (BAYER DCA - WAIVED) 7.7 (H) <7.0 %  VITAMIN D 25 Hydroxy (Vit-D Deficiency, Fractures)  Result Value Ref Range   Vit D, 25-Hydroxy 57.2 30.0 - 100.0 ng/mL     Xray report: IMPRESSION: 1. Right sixth, seventh and eighth lateral minimally displaced rib fractures. 2. Minimally displaced eighth and ninth left lateral rib fractures.  Pertinent labs & imaging results that were available during my care of the patient were reviewed by me and considered in my medical decision making.   Assessment & Plan:  Hannah French was seen today for chest pain.  Diagnoses and all orders for this visit:  Rib pain on left side -  Cancel: DG Ribs Unilateral W/Chest Left; Future  Rib pain on right side -     Cancel: DG Ribs Unilateral W/Chest Right; Future  Closed fracture of multiple ribs of both sides, initial encounter Right 6th, 7th, and 8th rib fractures. Left 8th and 9th rib fractures. Minimally displaced. No pneumothorax. Symptomatic care discussed. Incentive spirometer use discussed in detail. Will pick up at [facility]. Pt aware of signs and symptoms that warrant emergent evaluations. Follow up in 2 weeks for recheck.  -     For home use only DME Other see comment     Continue all other maintenance medications.  Follow up plan: Return in about 2 weeks (around 03/24/2019), or if symptoms worsen or fail to improve, for Rib fractures.  Educational handout given for rib fractures.   The above assessment and management plan was discussed with the patient. The patient verbalized understanding of and has agreed to the management plan. Patient is aware to call the clinic if symptoms persist or worsen. Patient is aware when to return to the clinic for a follow-up visit. Patient educated on when it is appropriate to go to the emergency department.   Monia Pouch, FNP-C [facility] [phone]

## 2019-04-16 ENCOUNTER — Telehealth: Payer: Self-pay | Admitting: Family Medicine

## 2019-04-16 MED ORDER — BLOOD GLUCOSE METER KIT: PACK | 0 refills | Status: DC

## 2019-04-16 NOTE — Telephone Encounter (Signed)
Printed and faxed

## 2019-04-23 ENCOUNTER — Other Ambulatory Visit: Payer: Self-pay

## 2019-04-23 NOTE — Patient Outreach (Signed)
[facility]) Care Management  04/23/2019  Hannah French 1941/02/14 [id number]

## 2019-04-23 NOTE — Patient Outreach (Signed)
[facility]) Care Management  04/23/2019  Hannah French 1941/03/20 [id number]   Medication Adherence call to Hannah French [facility] Identifiers Verify spoke with patient she is past due on Losartan 25 mg patient explain she is no longer taking this medication because her blood pressure was dropping to low doctor told patient to stop the medication. Hannah French is showing past due under [place].   [place] Management Direct Dial [phone]  Fax [phone] [email or web address]

## 2019-04-28 ENCOUNTER — Other Ambulatory Visit: Payer: Self-pay

## 2019-04-28 ENCOUNTER — Ambulatory Visit (HOSPITAL_COMMUNITY): Payer: Medicare Other | Attending: Cardiology

## 2019-04-28 DIAGNOSIS — I447 Left bundle-branch block, unspecified: Secondary | ICD-10-CM

## 2019-04-28 DIAGNOSIS — I5189 Other ill-defined heart diseases: Secondary | ICD-10-CM | POA: Diagnosis not present

## 2019-05-05 ENCOUNTER — Telehealth: Payer: Self-pay | Admitting: Cardiovascular Disease

## 2019-05-05 NOTE — Telephone Encounter (Signed)
-----   Message from Sherren Mocha, MD sent at 05/05/2019  6:45 AM EDT ----- LVEF severely reduced - no major change from last study. Medical therapy has been limited by side effects. Please arrange FOV to review treatment options (APP visit or with me based on availability). thanks

## 2019-05-05 NOTE — Telephone Encounter (Signed)
New Message   Patient is calling to obtain the results of her echo. Please call to discuss.

## 2019-05-05 NOTE — Telephone Encounter (Signed)
Reviewed results with patient who verbalized understanding.   Scheduled patient for visit with Melina Copa 8/19. She understands screening procedure and will call if she as COVID exposure or any symptoms prior to visit. She understands she can have 1 visitor to help her ambulate. She was grateful for call and agrees with treatment plan.

## 2019-05-08 DIAGNOSIS — M79674 Pain in right toe(s): Secondary | ICD-10-CM | POA: Diagnosis not present

## 2019-05-08 DIAGNOSIS — L03031 Cellulitis of right toe: Secondary | ICD-10-CM | POA: Diagnosis not present

## 2019-05-14 ENCOUNTER — Telehealth: Payer: Self-pay | Admitting: Physician Assistant

## 2019-05-14 ENCOUNTER — Ambulatory Visit (INDEPENDENT_AMBULATORY_CARE_PROVIDER_SITE_OTHER): Payer: Medicare Other | Admitting: Physician Assistant

## 2019-05-14 ENCOUNTER — Encounter: Payer: Self-pay | Admitting: Physician Assistant

## 2019-05-14 ENCOUNTER — Other Ambulatory Visit: Payer: Self-pay

## 2019-05-14 VITALS — BP 102/70 | HR 91 | Ht 61.0 in | Wt 122.0 lb

## 2019-05-14 DIAGNOSIS — I2510 Atherosclerotic heart disease of native coronary artery without angina pectoris: Secondary | ICD-10-CM | POA: Diagnosis not present

## 2019-05-14 DIAGNOSIS — I739 Peripheral vascular disease, unspecified: Secondary | ICD-10-CM | POA: Diagnosis not present

## 2019-05-14 DIAGNOSIS — I771 Stricture of artery: Secondary | ICD-10-CM | POA: Diagnosis not present

## 2019-05-14 DIAGNOSIS — I5042 Chronic combined systolic (congestive) and diastolic (congestive) heart failure: Secondary | ICD-10-CM

## 2019-05-14 DIAGNOSIS — R42 Dizziness and giddiness: Secondary | ICD-10-CM

## 2019-05-14 NOTE — Progress Notes (Signed)
Cardiology Office Note    Date:  05/14/2019   ID:  Hannah French, DOB December 24, 1940, MRN [id number]  PCP:  Dettinger, Fransisca Kaufmann, MD  Cardiologist:  Sherren Mocha, MD  Electrophysiologist:  None   Chief Complaint: dizziness  History of Present Illness:   Hannah French is a 77 y.o. 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 She was seen in the office on 08/26/2018 by Dr. Burt Knack and was noted to have shortness of breath felt to be related to COPD and URI. She did have a progression on her EKG from previous IVCD to left bundle branch block. An echo was done which showed a decline in LV function with EF 30-35% with diffuse hypokinesis (prior was normal). With new LBBB and significant change in LV function she was sent for right and left heart cath which was done on 09/16/2018 which showed nonobstructive CAD with diffuse luminal irregularities involving the left main, left circumflex and RCA. There was 40-50% calcific stenosis of the proximal LAD. There was mild pulmonary hypertension and mild elevation in LVEDP with preserved cardiac output. Medical therapy was recommended. This has unfortunately been limited by low blood pressure and dizziness (beta blocker reduced, losartan stopped). Corlanor was started for elevated HR and soft BP but not yet started - they filled out paperwork but have not heard back. Last labs 01/2019 showed LDL 93, K 4.1, Cr 1.08, normal LFTs, Hgb 12.0, A1c 7.7. F/u echocardiogram 04/28/19 showed EF 25-30%, pseudonormalization, diffuse HK, mildly dilated LA, mild MR. Last duplex 02/2018 showed normal ABIs, severe bilateral iliac stenosis; last carotid duplex 07/2017 showed 1-39% BICA, bilateral  subclavian stenosis. She is not able to take statin drugs or aspirin because of intolerance to both medicines. She has stated that she is a "free bleeder" and cannot tolerate any antiplatelet therapy.   She returns for follow-up with her daughter. She is very hard of hearing. The dizziness has improved, but she still reports fatigue and dyspnea on exertion. She denies any PND or edema. She denies any chest pain. There is no specific dizziness when raising her hands over her head. She does have a difference in BP, L arm 102/70 and R arm 124/68. No falls. Her daughter sees Dr. Haroldine Laws for heart failure felt possibly related to prior chemotherapy.  Past Medical History:  Diagnosis Date   Abdominal bruit    Bladder cancer ([place])    CAD (coronary artery disease)    a. nonobstructive by cath 08/2018.   Carotid bruit    Chronic combined systolic and diastolic CHF (congestive heart failure) (H[facility])    CKD (chronic kidney disease), stage II    Diabetes mellitus    Type II   Dyslipidemia    Heart murmur    Mild pulmonary hypertension ([facility])    Mitral valve prolapse    a. not seen on most recent echoes. Mild MR now.   NICM (nonischemic cardiomyopathy) ([place])    Osteoporosis    Pancreatitis, acute    Pseudogout    PVD (peripheral vascular disease) ([place])    a. 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   Sinus tachycardia    Tobacco abuse     Past Surgical History:  Procedure Laterality Date   APPENDECTOMY  Hysterectomy-type unspecified     LAPAROSCOPIC CHOLECYSTECTOMY  2009   RIGHT/LEFT HEART CATH AND CORONARY ANGIOGRAPHY N/A 09/16/2018   Procedure: RIGHT/LEFT HEART CATH AND CORONARY ANGIOGRAPHY;  Surgeon: Sherren Mocha, MD;  Location: [facility];  Service: Cardiovascular;  Laterality: N/A;    Current Medications: Current Meds  Medication Sig   blood glucose meter kit and supplies Use up to four times daily as  directed   Cholecalciferol (VITAMIN D) 50 MCG (2000 UT) tablet Take 6,000 Units by mouth daily.   Fluticasone-Salmeterol (ADVAIR) 100-50 MCG/DOSE AEPB Inhale 1 puff into the lungs 2 (two) times daily.   furosemide (LASIX) 20 MG tablet Take 1 tablet (20 mg total) by mouth daily. You may take an extra dose for increase weight gain or swelling   glimepiride (AMARYL) 2 MG tablet Take 1-2 tablets (2-4 mg total) by mouth daily with breakfast.   glucose blood (ONETOUCH VERIO) test strip USE TO CHECK BLOOD GLUCOSE TWICE DAILY AND AS NEEDED   ibuprofen (ADVIL,MOTRIN) 200 MG tablet Take 400 mg by mouth 2 (two) times daily as needed (for pain).    metoprolol succinate (TOPROL-XL) 25 MG 24 hr tablet Take 25 mg by mouth daily.   mupirocin ointment (BACTROBAN) 2 %    ONETOUCH DELICA LANCETS 22E MISC USE   TO CHECK GLUCOSE TWICE DAILY AND AS NEEDED   vitamin B-12 (CYANOCOBALAMIN) 1000 MCG tablet Take 1,000 mcg by mouth daily.     Allergies:   Contrast media [iodinated diagnostic agents], Iohexol, Metformin, Pravastatin, Albuterol, Bextra [valdecoxib], Cefdinir, Cozaar [losartan potassium], Nsaids, Statins, Sulfa antibiotics, Zetia [ezetimibe], and Rofecoxib   Social History   Socioeconomic History   Marital status: Divorced    Spouse name: Not on file   Number of children: 2   Years of education: Not on file   Highest education level: Not on file  Occupational History   Occupation: Retired  Scientist, product/process development strain: Not on file   Food insecurity    Worry: Not on file    Inability: Not on Lexicographer needs    Medical: Not on file    Non-medical: Not on file  Tobacco Use   Smoking status: Current Every Day Smoker    Packs/day: 1.00    Years: 30.00    Pack years: 30.00    Types: Cigarettes   Smokeless tobacco: Never Used  Substance and Sexual Activity   Alcohol use: No   Drug use: No   Sexual activity: Not on file  Lifestyle   Physical  activity    Days per week: Not on file    Minutes per session: Not on file   Stress: Not on file  Relationships   Social connections    Talks on phone: Not on file    Gets together: Not on file    Attends religious service: Not on file    Active member of club or organization: Not on file    Attends meetings of clubs or organizations: Not on file    Relationship status: Not on file  Other Topics Concern   Not on file  Social History Narrative   Not on file     Family History:  [name] family history includes Heart attack in her brother and maternal uncle; Heart failure in her mother; Hypertension in her brother and sister. There is no history of Coronary artery disease.  ROS:   Please see the history of present illness.  All other  systems are reviewed and otherwise negative.    EKGs/Labs/Other Studies Reviewed:    Studies reviewed were summarized above.   EKG:  EKG is ordered today, personally reviewed, demonstrating NSR 94bpm with known LBBB similar to prior  Recent Labs: 08/13/2018: B Natriuretic Peptide 202.7; Magnesium 1.7 10/04/2018: NT-Pro BNP 4,124 01/29/2019: ALT 5; BUN 18; Creatinine, Ser 1.08; Hemoglobin 12.0; Platelets 306; Potassium 4.1; Sodium 141  Recent Lipid Panel    Component Value Date/Time   CHOL 155 01/29/2019 0910   TRIG 100 01/29/2019 0910   TRIG 82 06/09/2014 1028   HDL 42 01/29/2019 0910   HDL 57 06/09/2014 1028   CHOLHDL 3.7 01/29/2019 0910   CHOLHDL 3.5 02/06/2013 0932   VLDL 20 02/06/2013 0932   LDLCALC 93 01/29/2019 0910   LDLCALC 131 (H) 06/09/2014 1028    PHYSICAL EXAM:    VS:  BP 102/70    Pulse 91    Ht '5\' 1"'  (1.549 m)    Wt 122 lb (55.3 kg)    SpO2 96%    BMI 23.05 kg/m   BMI: Body mass index is 23.05 kg/m.  GEN: Well nourished WF in no acute distress HEENT: normocephalic, atraumatic Neck: no JVD or masses, + prominent L subclavian bruit Cardiac: RRR; no murmurs, rubs, or gallops, no edema  Respiratory: diffusely  diminished bilaterally without rales/wheezes/rhonchi, normal work of breathing GI: soft, nontender, nondistended, + BS MS: no deformity or atrophy Skin: warm and dry, no rash Neuro:  Alert and Oriented x 3, Strength and sensation are intact, follows commands, hard of hearing Psych: euthymic mood, full affect  Wt Readings from Last 3 Encounters:  05/14/19 122 lb (55.3 kg)  03/10/19 121 lb 12.8 oz (55.2 kg)  01/29/19 122 lb 6.4 oz (55.5 kg)     ASSESSMENT & PLAN:   1. Dizziness - improved. Follow for recurrence. See below regarding PAD. 2. Chronic combined CHF - unfortunately her treatment has been severely limited by her dizziness and soft BP. She does have a BP differential between arms so should likely be using the right arm to obtain accurate readings. Her comorbidities seem to be intertwining with each other, creating a complex clinical picture. Although volume looks good, she still reports dyspnea and fatigue with exertion. Some of this could be related to COPD. However, the etiology of her cardiomyopathy has not been fully determined. I would like for her to see Dr. Haroldine Laws with the Advanced Heart Failure clinic given her inability to tolerate traditional HF management. May need cMRI vs PYP to exclude infiltrative disease. It is particularly interesting that her daughter also has significant heart failure as well. In addition to seeing our HF team I will refer her to EP to consider whether she would benefit from cardiac resynchronization therapy given her low EF and LBBB. 3. PAD with subclavian artery stenosis - it is unclear to me whether this could be contributing to her dizziness, which has improved on lower dose of metoprolol. She had partial steal on 2018 duplex. Given BP differential, will update carotid duplex and get her back into see Dr. Fletcher Anon for re-evaluation to consider whether subclavian stenting would be beneficial to allow her to tolerate further titration of her CHF  regimen. 4. CAD - nonobstructive by prior cath. Consideration could be given in the future to seeing lipid clinic for alternative therapy but will start with the above so as not to overwhelm the patient.  Disposition: F/u with above consultants for next steps. Will also cc  chart to Dr. Burt Knack for review.  Medication Adjustments/Labs and Tests Ordered: Current medicines are reviewed at length with the patient today.  Concerns regarding medicines are outlined above. Medication changes, Labs and Tests ordered today are summarized above and listed in the Patient Instructions accessible in Encounters.   Signed, Charlie Pitter, PA-C  05/14/2019 5:32 PM    [facility] [place], [place], [place]  [address] Phone: [phone]; Fax: [phone]

## 2019-05-14 NOTE — Patient Instructions (Signed)
Medication Instructions:  Your physician recommends that you continue on your current medications as directed. Please refer to the Current Medication list given to you today.  If you need a refill on your cardiac medications before your next appointment, please call your pharmacy.   Lab work: None ordered  If you have labs (blood work) drawn today and your tests are completely normal, you will receive your results only by: Marland Kitchen MyChart Message (if you have MyChart) OR . A paper copy in the mail If you have any lab test that is abnormal or we need to change your treatment, we will call you to review the results.  Testing/Procedures: Your physician has requested that you have a carotid duplex. This test is an ultrasound of the carotid arteries in your neck. It looks at blood flow through these arteries that supply the brain with blood. Allow one hour for this exam. There are no restrictions or special instructions.  You have been referred to 1ST AVAILABLE ELECTROPHYSIOLOGIST TO DISCUSS CRT PACEMAKER   You have been referred to [place], DR. Louisa Second, FOR HEART FAILURE Follow-Up: At [facility], you and your health needs are our priority.  As part of our continuing mission to provide you with exceptional heart care, we have created designated Provider Care Teams.  These Care Teams include your primary Cardiologist (physician) and Advanced Practice Providers (APPs -  Physician Assistants and Nurse Practitioners) who all work together to provide you with the care you need, when you need it. You will need a follow up appointment 1ST AVAILABLE WITH DR. Fletcher Anon FOR SUBCLAVIAN STENOSIS  Any Other Special Instructions Will Be Listed Below (If Applicable). WHEN CHECKING YOUR BLOOD PRESSURE, MAKE SURE TO CHECK IT IN THE RIGHT ARM

## 2019-05-14 NOTE — Telephone Encounter (Signed)
   Daughter inquired about whether she could have copy of the note I was reading from (this was my pre-note in prep for clinic). Please let them know I shared that note with them on MyChart so they should see it under their Visits tab under this encounter.   They also requested f/u info on whether the Corlanor was approved - had to fill out paperwork but never hear back. Can you forward this to who may help? This is another reason why CHF clinic will be helpful. Jamine Highfill PA-C

## 2019-05-15 NOTE — Telephone Encounter (Signed)
Pt has been made aware that she can get a copy of the note on her mychart. Will route this to [facility] Via re: Corlanor.

## 2019-05-15 NOTE — Telephone Encounter (Signed)
The pt is advised to contact [employer] at [phone] to check the progress of her application. She is aware that we faxed her application for Corlanor pt asst to [employer] on 02/13/2019.  She thanked me for calling her concerning this matter.

## 2019-05-16 ENCOUNTER — Other Ambulatory Visit: Payer: Self-pay | Admitting: Family Medicine

## 2019-05-16 DIAGNOSIS — Z1231 Encounter for screening mammogram for malignant neoplasm of breast: Secondary | ICD-10-CM

## 2019-05-19 ENCOUNTER — Telehealth (HOSPITAL_COMMUNITY): Payer: Self-pay | Admitting: *Deleted

## 2019-05-19 NOTE — Telephone Encounter (Signed)
Pt called about referral placed by Melina Copa pt offered first available appt. Pt scheduled appt but would like to see Dr.Bensimhon sooner if possible. She wants to see him before she has appt w/EP about device. Pts daughter Augusto Gamble is followed by Dr.Bensimhon.  Routed to [employer] to see if we can work patient in sooner

## 2019-05-21 ENCOUNTER — Other Ambulatory Visit (HOSPITAL_COMMUNITY): Payer: Self-pay | Admitting: Cardiovascular Disease

## 2019-05-21 DIAGNOSIS — I739 Peripheral vascular disease, unspecified: Secondary | ICD-10-CM

## 2019-05-22 ENCOUNTER — Ambulatory Visit (HOSPITAL_COMMUNITY)
Admission: RE | Admit: 2019-05-22 | Discharge: 2019-05-22 | Disposition: A | Discharge status: Home / Self Care | Payer: Medicare Other | Source: Ambulatory Visit | Attending: Physician Assistant | Admitting: Physician Assistant

## 2019-05-22 ENCOUNTER — Ambulatory Visit (HOSPITAL_BASED_OUTPATIENT_CLINIC_OR_DEPARTMENT_OTHER)
Admission: RE | Admit: 2019-05-22 | Discharge: 2019-05-22 | Disposition: A | Discharge status: Home / Self Care | Payer: Medicare Other | Source: Ambulatory Visit | Attending: Cardiovascular Disease | Admitting: Cardiovascular Disease

## 2019-05-22 ENCOUNTER — Other Ambulatory Visit: Payer: Self-pay

## 2019-05-22 DIAGNOSIS — I739 Peripheral vascular disease, unspecified: Secondary | ICD-10-CM

## 2019-05-22 DIAGNOSIS — I771 Stricture of artery: Secondary | ICD-10-CM | POA: Diagnosis not present

## 2019-05-22 DIAGNOSIS — L03031 Cellulitis of right toe: Secondary | ICD-10-CM | POA: Diagnosis not present

## 2019-05-22 NOTE — Telephone Encounter (Signed)
appt sch for Mon 8/31

## 2019-05-25 NOTE — Progress Notes (Signed)
ADVANCED HF CLINIC CONSULT NOTE  Date:  05/26/2019   ID:  Hannah French, DOB 19-Jun-1941, MRN [id number]  PCP:  Dettinger, Fransisca Kaufmann, MD  Cardiologist:  Sherren Mocha, MD  Electrophysiologist:  None   Referring Provider: Melina Copa PA-C  Chief Complaint:  HF  History of Present Illness:   Hannah French is a 78 y.o. 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   She was seen in the office on 08/26/2018 by Dr. Burt Knack and was noted to have shortness of breath felt to be related to COPD and URI. She did have a progression on her EKG from previous IVCD to left bundle branch block. An echo was done which showed a decline in LV function with EF 30-35% with diffuse hypokinesis (prior was normal). With new LBBB and significant change in LV function she was sent for right and left heart cath which was done on 09/16/2018 which showed nonobstructive CAD with diffuse luminal irregularities involving the left main, left circumflex and RCA. There was 40-50% calcific stenosis of the proximal LAD. There was mild pulmonary hypertension and mild elevation in LVEDP with preserved cardiac output. Medical therapy was recommended.. She is not able to take statin drugs or aspirin because of intolerance to both medicines. She has stated that she is a "free bleeder" and cannot tolerate any antiplatelet therapy.   Her medical therapy has been limited by low BP and dizziness (beta blocker reduced, losartan stopped). Corlanor was started for ordered HR and soft BP but not yet approved, F/u echo 04/28/19 showed EF 25-30%, diffuse HK, Grade II DD,  mildly dilated LA, mild MR.   She is here with her  daughter. She is very hard of hearing. Her main complaint is SOB. Can do ADLs without too much problem. If she tries to walk faster or a long distance she gets SOB. Denies CP. Her edema has been much improved over the past week. No orthopnea or PND. No fevers or chills. + night sweats. Denies dizziness. Says she was "allergic" to losartan and it made her dizzy. Not taking it any more and dizziness improved. Still smoking 1ppd or a bit less.    Review of Systems: [y] = yes, '[ ]'  = no    General: Weight gain '[]' ; Weight loss '[ ]' ; Anorexia '[ ]' ; Fatigue [y]; Fever '[ ]' ; Chills '[ ]' ; Weakness Hannah.French ]   Cardiac: Chest pain/pressure '[ ]' ; Resting SOB [ y]; Exertional SOB [y]; Orthopnea '[ ]' ; Pedal Edema '[]' ; Palpitations '[ ]' ; Syncope '[ ]' ; Presyncope '[ ]' ; Paroxysmal nocturnal dyspnea'[ ]'    Pulmonary: Cough '[ ]' ; Wheezing'[ ]' ; Hemoptysis'[ ]' ; Sputum '[ ]' ; Snoring '[ ]'    GI: Vomiting'[ ]' ; Dysphagia'[ ]' ; Melena'[ ]' ; Hematochezia '[ ]' ; Heartburn'[ ]' ; Abdominal pain '[ ]' ; Constipation '[ ]' ; Diarrhea '[ ]' ; BRBPR '[ ]'    GU: Hematuria'[ ]' ; Dysuria '[ ]' ; Nocturia'[ ]'   Vascular: Pain in legs with walking '[ ]' ; Pain in feet with lying flat '[ ]' ; Non-healing sores '[ ]' ; Stroke '[ ]' ; TIA '[ ]' ; Slurred speech '[ ]' ;   Neuro: Headaches'[ ]' ; Vertigo'[ ]' ; Seizures'[ ]' ; Paresthesias'[ ]' ;Blurred vision '[ ]' ; Diplopia '[ ]' ; Vision changes '[ ]'    Ortho/Skin: Arthritis [y]; Joint  pain [y]; Muscle pain '[ ]' ; Joint swelling '[ ]' ; Back Pain '[ ]' ; Rash '[ ]'    Psych: Depression'[ ]' ; Anxiety'[ ]'    Heme: Bleeding problems '[ ]' ; Clotting disorders '[ ]' ; Anemia '[ ]'    Endocrine: Diabetes Hannah.French ]; Thyroid dysfunction'[ ]'    Past Medical History:  Diagnosis Date  . Abdominal bruit   . Bladder cancer ([place])   . CAD (coronary artery disease)    a. nonobstructive by cath 08/2018.  . Carotid bruit   . Chronic combined systolic and diastolic CHF (congestive heart failure) ([place])   . CKD (chronic kidney disease), stage II   . Diabetes mellitus    Type II  .  Dyslipidemia   . Heart murmur   . Mild pulmonary hypertension ([place])   . Mitral valve prolapse    a. not seen on most recent echoes. Mild MR now.  Marland Kitchen NICM (nonischemic cardiomyopathy) ([place])   . Osteoporosis   . Pancreatitis, acute   . Pseudogout   . PVD (peripheral vascular disease) ([place])    a. 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  . Sinus tachycardia   . Tobacco abuse     Past Surgical History:  Procedure Laterality Date  . APPENDECTOMY    . Hysterectomy-type unspecified    . LAPAROSCOPIC CHOLECYSTECTOMY  2009  . RIGHT/LEFT HEART CATH AND CORONARY ANGIOGRAPHY N/A 09/16/2018   Procedure: RIGHT/LEFT HEART CATH AND CORONARY ANGIOGRAPHY;  Surgeon: Sherren Mocha, MD;  Location: [facility];  Service: Cardiovascular;  Laterality: N/A;    Current Medications: Current Meds  Medication Sig  . blood glucose meter kit and supplies Use up to four times daily as directed  . Cholecalciferol (VITAMIN D) 50 MCG (2000 UT) tablet Take 6,000 Units by mouth daily.  . Fluticasone-Salmeterol (ADVAIR) 100-50 MCG/DOSE AEPB Inhale 1 puff into the lungs 2 (two) times daily.  . furosemide (LASIX) 20 MG tablet Take 1 tablet (20 mg total) by mouth daily. You may take an extra dose for increase weight gain or swelling  . glimepiride (AMARYL) 2 MG tablet Take 1-2 tablets (2-4 mg total) by mouth daily with breakfast.  . glucose blood (ONETOUCH VERIO) test strip USE TO CHECK BLOOD GLUCOSE TWICE DAILY AND AS NEEDED  . ibuprofen (ADVIL,MOTRIN) 200 MG tablet Take 400 mg by mouth 2 (two) times daily as needed (for pain).   . metoprolol succinate (TOPROL-XL) 25 MG 24 hr tablet Take 25 mg by mouth daily.  . mupirocin ointment (BACTROBAN) 2 %   . ONETOUCH DELICA LANCETS 71I MISC USE   TO CHECK GLUCOSE TWICE DAILY AND AS NEEDED     Allergies:   Contrast media [iodinated diagnostic agents], Iohexol, Metformin, Pravastatin, Albuterol, Bextra [valdecoxib],  Cefdinir, Cozaar [losartan potassium], Nsaids, Statins, Sulfa antibiotics, Zetia [ezetimibe], and Rofecoxib   Social History   Socioeconomic History  . Marital status: Divorced    Spouse name: Not on file  . Number of children: 2  . Years of education: Not on file  . Highest education level: Not on file  Occupational History  . Occupation: Retired  Scientific laboratory technician  . Financial resource strain: Not on file  . Food insecurity    Worry: Not on file    Inability: Not on file  . Transportation needs    Medical: Not on file    Non-medical: Not on file  Tobacco Use  . Smoking status: Current Every Day Smoker    Packs/day: 1.00    Years: 30.00  Pack years: 30.00    Types: Cigarettes  . Smokeless tobacco: Never Used  Substance and Sexual Activity  . Alcohol use: No  . Drug use: No  . Sexual activity: Not on file  Lifestyle  . Physical activity    Days per week: Not on file    Minutes per session: Not on file  . Stress: Not on file  Relationships  . Social Herbalist on phone: Not on file    Gets together: Not on file    Attends religious service: Not on file    Active member of club or organization: Not on file    Attends meetings of clubs or organizations: Not on file    Relationship status: Not on file  Other Topics Concern  . Not on file  Social History Narrative  . Not on file     Family History:  [name] family history includes Heart attack in her brother and maternal uncle; Heart failure in her mother; Hypertension in her brother and sister. There is no history of Coronary artery disease.   EKGs/Labs/Other Studies Reviewed:    Studies reviewed were summarized above.   EKG:  EKG 05/14/19, personally reviewed, demonstrating NSR 94bpm with known LBBB (QRS 115m)  similar to prior  Recent Labs: 08/13/2018: B Natriuretic Peptide 202.7; Magnesium 1.7 10/04/2018: NT-Pro BNP 4,124 01/29/2019: ALT 5; BUN 18; Creatinine, Ser 1.08; Hemoglobin 12.0;  Platelets 306; Potassium 4.1; Sodium 141  Recent Lipid Panel    Component Value Date/Time   CHOL 155 01/29/2019 0910   TRIG 100 01/29/2019 0910   TRIG 82 06/09/2014 1028   HDL 42 01/29/2019 0910   HDL 57 06/09/2014 1028   CHOLHDL 3.7 01/29/2019 0910   CHOLHDL 3.5 02/06/2013 0932   VLDL 20 02/06/2013 0932   LDLCALC 93 01/29/2019 0910   LDLCALC 131 (H) 06/09/2014 1028    PHYSICAL EXAM:    VS:  BP 131/64   Pulse 91   Wt 55.2 kg (121 lb 9.6 oz)   SpO2 96%   BMI 22.98 kg/m   BMI: Body mass index is 22.98 kg/m.  General:  Chronically ill appearing. SOB at rest  HEENT: normal Neck: supple. JVP to jaw Carotids 2+ bilat; + bilat bruits. No lymphadenopathy or thryomegaly appreciated. Cor: PMI nondisplaced. Regular rate & rhythm. 2/6 SEM at RUSB Lungs: clear with markedly decreased BS throughout Abdomen: soft, nontender, nondistended. No hepatosplenomegaly. No bruits or masses. Good bowel sounds. Extremities: no cyanosis, clubbing, rash, edema Neuro: alert & orientedx3, cranial nerves grossly intact. moves all 4 extremities w/o difficulty. Affect pleasant   Wt Readings from Last 3 Encounters:  05/26/19 55.2 kg (121 lb 9.6 oz)  05/14/19 55.3 kg (122 lb)  03/10/19 55.2 kg (121 lb 12.8 oz)     ASSESSMENT & PLAN:   1. Chronic systolic HF  - due to NICM (? LBBB but not very wide)  - Echo 12/19 EF 30-35% with diffuse hypokinesis - Echo  8/20 EF 25-30% with Grade II DD - Cath 12/19 nonobstructive CAD - NYHA IIIb compounded by severe PAD and probable end-stage COPD - Volume status elevated - Intolerant of meds due to low BP and dizziness (She does have a BP differential between arms so should likely be using the right arm to obtain accurate readings) - On Toprol 25 daily - Losartan stopped due to low BP - Corlanor ordered but not approved yet - Will increase lasix to 40 daily and add digoxin 0.125 daily -  I suspect COPD is limiting her as much, if not more, than her HF. Will get  PFTs and do hall walk before being more aggressive with HF. Long discussion with her about having realistic expectations of how much we can improve her symptoms with several systems involved here. - has already been referred to EP to consider CRT but not sure LBBB will qualify and I would hold off on ICD for now until we get a better understanding regarding how severe her COPD is   2. PAD with left subclavian artery stenosis and severe iliac disease - has been referred to Dr. Fletcher Anon  3. CAD - nonobstructive by prior cath. Refuses statins and ASA - no s/s ischemia currently   4. COPD with ongoing tobacco use - encouraged to quit  - as above, I suspect COPD is limiting her as much, if not more, than her HF. Will get PFTs and do hall walk before being more aggressive with HF  5. DM2 - consider SGLT2i as tolerated by BP  6. AS murmur - significant AS murmur on exam but no significant AS by echo   Total time spent 50 minutes. Over half that time spent discussing above.   Signed, Glori Bickers, MD  05/26/2019 4:06 PM    [facility] [address], [place], [place]  [address] Phone: [phone]; Fax: [phone]

## 2019-05-26 ENCOUNTER — Encounter (HOSPITAL_COMMUNITY): Payer: Self-pay | Admitting: Internal Medicine

## 2019-05-26 ENCOUNTER — Other Ambulatory Visit: Payer: Self-pay

## 2019-05-26 ENCOUNTER — Ambulatory Visit (HOSPITAL_COMMUNITY)
Admission: RE | Admit: 2019-05-26 | Discharge: 2019-05-26 | Disposition: A | Discharge status: Home / Self Care | Payer: Medicare Other | Source: Ambulatory Visit | Attending: Internal Medicine | Admitting: Internal Medicine

## 2019-05-26 VITALS — BP 131/64 | HR 91 | Wt 121.6 lb

## 2019-05-26 DIAGNOSIS — I5042 Chronic combined systolic (congestive) and diastolic (congestive) heart failure: Secondary | ICD-10-CM | POA: Diagnosis not present

## 2019-05-26 DIAGNOSIS — I5022 Chronic systolic (congestive) heart failure: Secondary | ICD-10-CM

## 2019-05-26 DIAGNOSIS — E1151 Type 2 diabetes mellitus with diabetic peripheral angiopathy without gangrene: Secondary | ICD-10-CM | POA: Diagnosis not present

## 2019-05-26 DIAGNOSIS — E785 Hyperlipidemia, unspecified: Secondary | ICD-10-CM | POA: Insufficient documentation

## 2019-05-26 DIAGNOSIS — E1122 Type 2 diabetes mellitus with diabetic chronic kidney disease: Secondary | ICD-10-CM | POA: Insufficient documentation

## 2019-05-26 DIAGNOSIS — I771 Stricture of artery: Secondary | ICD-10-CM | POA: Diagnosis not present

## 2019-05-26 DIAGNOSIS — Z720 Tobacco use: Secondary | ICD-10-CM | POA: Diagnosis not present

## 2019-05-26 DIAGNOSIS — I272 Pulmonary hypertension, unspecified: Secondary | ICD-10-CM | POA: Insufficient documentation

## 2019-05-26 DIAGNOSIS — Z7984 Long term (current) use of oral hypoglycemic drugs: Secondary | ICD-10-CM | POA: Diagnosis not present

## 2019-05-26 DIAGNOSIS — Z79899 Other long term (current) drug therapy: Secondary | ICD-10-CM | POA: Insufficient documentation

## 2019-05-26 DIAGNOSIS — F17210 Nicotine dependence, cigarettes, uncomplicated: Secondary | ICD-10-CM | POA: Diagnosis not present

## 2019-05-26 DIAGNOSIS — I739 Peripheral vascular disease, unspecified: Secondary | ICD-10-CM

## 2019-05-26 DIAGNOSIS — I447 Left bundle-branch block, unspecified: Secondary | ICD-10-CM | POA: Diagnosis not present

## 2019-05-26 DIAGNOSIS — I341 Nonrheumatic mitral (valve) prolapse: Secondary | ICD-10-CM | POA: Diagnosis not present

## 2019-05-26 DIAGNOSIS — J449 Chronic obstructive pulmonary disease, unspecified: Secondary | ICD-10-CM | POA: Diagnosis not present

## 2019-05-26 DIAGNOSIS — Z8249 Family history of ischemic heart disease and other diseases of the circulatory system: Secondary | ICD-10-CM | POA: Insufficient documentation

## 2019-05-26 DIAGNOSIS — R61 Generalized hyperhidrosis: Secondary | ICD-10-CM | POA: Diagnosis not present

## 2019-05-26 DIAGNOSIS — I2510 Atherosclerotic heart disease of native coronary artery without angina pectoris: Secondary | ICD-10-CM | POA: Insufficient documentation

## 2019-05-26 DIAGNOSIS — N183 Chronic kidney disease, stage 3 (moderate): Secondary | ICD-10-CM | POA: Diagnosis not present

## 2019-05-26 LAB — BASIC METABOLIC PANEL
Anion gap: 12 (ref 5–15)
BUN: 23 mg/dL (ref 8–23)
CO2: 24 mmol/L (ref 22–32)
Calcium: 9.2 mg/dL (ref 8.9–10.3)
Chloride: 96 mmol/L — ABNORMAL LOW (ref 98–111)
Creatinine, Ser: 1.05 mg/dL — ABNORMAL HIGH (ref 0.44–1.00)
GFR calc Af Amer: 59 mL/min — ABNORMAL LOW (ref >60)
GFR calc non Af Amer: 51 mL/min — ABNORMAL LOW (ref >60)
Glucose, Bld: 266 mg/dL — ABNORMAL HIGH (ref 70–99)
Potassium: 4.1 mmol/L (ref 3.5–5.1)
Sodium: 132 mmol/L — ABNORMAL LOW (ref 135–145)

## 2019-05-26 LAB — BRAIN NATRIURETIC PEPTIDE: B Natriuretic Peptide: 329.7 pg/mL — ABNORMAL HIGH (ref 0.0–100.0)

## 2019-05-26 MED ORDER — DIGOXIN 125 MCG PO TABS: 0.1250 mg | ORAL_TABLET | Freq: Every day | ORAL | 3 refills | Status: DC

## 2019-05-26 NOTE — Patient Instructions (Signed)
Start Digoxin 0.125 mg daily  Increase Furosemide to 40 mg daily  Labs done today  Labs in 2 weeks and your Primary Care doctor  Your physician has recommended that you have a pulmonary function test. Pulmonary Function Tests are a group of tests that measure how well air moves in and out of your lungs.  YOU WILL NEED A COVID TEST FOR THIS, ONCE IT IS SCHEDULE WE WILL CALL YOU TO ARRANGE THE COVID TEST  You have been referred to [facility], they will call you for this appointment  Your physician recommends that you schedule a follow-up appointment in: 6-8 weeks  At the [facility], you and your health needs are our priority. As part of our continuing mission to provide you with exceptional heart care, we have created designated Provider Care Teams. These Care Teams include your primary Cardiologist (physician) and Advanced Practice Providers (APPs- Physician Assistants and Nurse Practitioners) who all work together to provide you with the care you need, when you need it.   You may see any of the following providers on your designated Care Team at your next follow up: Marland Kitchen Dr Glori Bickers . Dr Loralie Champagne . Darrick Grinder, NP   Please be sure to bring in all your medications bottles to every appointment.

## 2019-05-26 NOTE — Progress Notes (Signed)
Pt briefly ambulated in hallway, on RA O2 sats ranged 93-96%

## 2019-05-28 ENCOUNTER — Telehealth: Payer: Self-pay

## 2019-05-28 DIAGNOSIS — I739 Peripheral vascular disease, unspecified: Secondary | ICD-10-CM

## 2019-05-28 NOTE — Telephone Encounter (Signed)
-----   Message from Sherren Mocha, MD sent at 05/22/2019  5:28 PM EDT ----- Stable, elevated velocities in the bilateral iliac arteries. No significant change from previous. ABI's in normal range. Please order repeat study in 12 months. thx

## 2019-05-28 NOTE — Telephone Encounter (Signed)
Per DPR form, left detailed message with results on VM.  Repeat study ordered to be scheduled in 1 year.

## 2019-05-30 ENCOUNTER — Encounter: Payer: Self-pay | Admitting: Family

## 2019-05-30 ENCOUNTER — Telehealth: Payer: Self-pay | Admitting: Family Medicine

## 2019-05-30 ENCOUNTER — Ambulatory Visit (INDEPENDENT_AMBULATORY_CARE_PROVIDER_SITE_OTHER): Payer: Medicare Other | Admitting: Family

## 2019-05-30 ENCOUNTER — Other Ambulatory Visit: Payer: Self-pay

## 2019-05-30 DIAGNOSIS — Z720 Tobacco use: Secondary | ICD-10-CM | POA: Diagnosis not present

## 2019-05-30 DIAGNOSIS — J441 Chronic obstructive pulmonary disease with (acute) exacerbation: Secondary | ICD-10-CM

## 2019-05-30 MED ORDER — DOXYCYCLINE HYCLATE 100 MG PO TABS: 100.0000 mg | ORAL_TABLET | Freq: Two times a day (BID) | ORAL | 0 refills | Status: DC

## 2019-05-30 MED ORDER — PREDNISONE 10 MG (21) PO TBPK: ORAL_TABLET | ORAL | 0 refills | Status: DC

## 2019-05-30 NOTE — Progress Notes (Signed)
Virtual Visit via telephone Note Due to COVID-19 pandemic this visit was conducted virtually. This visit type was conducted due to national recommendations for restrictions regarding the COVID-19 Pandemic (e.g. social distancing, sheltering in place) in an effort to limit this patient's exposure and mitigate transmission in our community. All issues noted in this document were discussed and addressed.  A physical exam was not performed with this format.  I connected with Hannah French on 05/30/19 at 1:42 pm  by telephone and verified that I am speaking with the correct person using two identifiers. Hannah French is currently located at home  and no one is currently with her during visit. The provider, Evelina Dun, FNP is located in their office at time of visit.  I discussed the limitations, risks, security and privacy concerns of performing an evaluation and management service by telephone and the availability of in person appointments. I also discussed with the patient that there may be a patient responsible charge related to this service. The patient expressed understanding and agreed to proceed.   History and Present Illness:  Cough This is a new problem. The current episode started in the past 7 days. The problem has been waxing and waning. The problem occurs every few minutes. The cough is productive of purulent sputum. Associated symptoms include ear pain (left), headaches, nasal congestion, shortness of breath and wheezing. Pertinent negatives include no chills, ear congestion, fever, myalgias or sore throat. The symptoms are aggravated by lying down. Risk factors for lung disease include smoking/tobacco exposure. She has tried rest for the symptoms. The treatment provided no relief. Her past medical history is significant for COPD.      Review of Systems  Constitutional: Negative for chills and fever.  HENT: Positive for ear pain (left). Negative for sore throat.    Respiratory: Positive for cough, shortness of breath and wheezing.   Musculoskeletal: Negative for myalgias.  Neurological: Positive for headaches.  All other systems reviewed and are negative.    Observations/Objective: No SOB or distress noted   Assessment and Plan: 1. Chronic obstructive pulmonary disease with acute exacerbation ([place]) PT reports she is going to get COVID tested and her CHF provider is taking care of this.  Force fluids Rest Mucinex Smoking cessation discussed Strict low carb diet  Call office if symptoms worsen or do not improve  Recheck in 1 week - doxycycline (VIBRA-TABS) 100 MG tablet; Take 1 tablet (100 mg total) by mouth 2 (two) times daily.  Dispense: 20 tablet; Refill: 0 - predniSONE (STERAPRED UNI-PAK 21 TAB) 10 MG (21) TBPK tablet; Use as directed  Dispense: 21 tablet; Refill: 0  2. Tobacco abuse Smoking cessation discussed     I discussed the assessment and treatment plan with the patient. The patient was provided an opportunity to ask questions and all were answered. The patient agreed with the plan and demonstrated an understanding of the instructions.   The patient was advised to call back or seek an in-person evaluation if the symptoms worsen or if the condition fails to improve as anticipated.  The above assessment and management plan was discussed with the patient. The patient verbalized understanding of and has agreed to the management plan. Patient is aware to call the clinic if symptoms persist or worsen. Patient is aware when to return to the clinic for a follow-up visit. Patient educated on when it is appropriate to go to the emergency department.   Time call ended: 1:53 pm  I provided 11 minutes of non-face-to-face time during this encounter.    Evelina Dun, FNP

## 2019-06-06 ENCOUNTER — Telehealth (HOSPITAL_COMMUNITY): Payer: Self-pay | Admitting: Cardiology

## 2019-06-06 NOTE — Telephone Encounter (Signed)
Patient called to follow up on coVID testing for pre procedure on PFT  Patient scheduled for 9/14 and aware of instructions

## 2019-06-09 ENCOUNTER — Other Ambulatory Visit: Payer: Self-pay | Admitting: Internal Medicine

## 2019-06-09 ENCOUNTER — Other Ambulatory Visit (HOSPITAL_COMMUNITY)
Admission: RE | Admit: 2019-06-09 | Discharge: 2019-06-09 | Disposition: A | Discharge status: Home / Self Care | Payer: Medicare Other | Source: Ambulatory Visit | Attending: Internal Medicine | Admitting: Internal Medicine

## 2019-06-09 ENCOUNTER — Other Ambulatory Visit: Payer: Self-pay

## 2019-06-09 ENCOUNTER — Other Ambulatory Visit: Payer: Medicare Other

## 2019-06-09 DIAGNOSIS — I5022 Chronic systolic (congestive) heart failure: Secondary | ICD-10-CM | POA: Diagnosis not present

## 2019-06-09 DIAGNOSIS — Z20828 Contact with and (suspected) exposure to other viral communicable diseases: Secondary | ICD-10-CM | POA: Diagnosis not present

## 2019-06-09 DIAGNOSIS — Z01812 Encounter for preprocedural laboratory examination: Secondary | ICD-10-CM | POA: Diagnosis not present

## 2019-06-09 LAB — SARS CORONAVIRUS 2 (TAT 6-24 HRS): SARS Coronavirus 2: NEGATIVE

## 2019-06-10 ENCOUNTER — Telehealth (HOSPITAL_COMMUNITY): Payer: Self-pay | Admitting: Cardiology

## 2019-06-10 ENCOUNTER — Ambulatory Visit: Payer: Medicare Other | Admitting: Family

## 2019-06-10 NOTE — Telephone Encounter (Signed)
Abnormal labs received. Labs drawn 06/09/19 K 5.3 Cr 0.99 BUN 36 Na 141   Per Amy Clegg,NP Patient will need to avoid foods high in potassium Remaining labs are stable  Patient aware and voiced understanding, reports she recently stopped eating a daily banana, and will decrease salt substitute usage

## 2019-06-11 ENCOUNTER — Other Ambulatory Visit: Payer: Self-pay

## 2019-06-11 ENCOUNTER — Ambulatory Visit (HOSPITAL_COMMUNITY)
Admission: RE | Admit: 2019-06-11 | Discharge: 2019-06-11 | Disposition: A | Discharge status: Home / Self Care | Payer: Medicare Other | Source: Ambulatory Visit | Attending: Internal Medicine | Admitting: Internal Medicine

## 2019-06-11 DIAGNOSIS — I5022 Chronic systolic (congestive) heart failure: Secondary | ICD-10-CM | POA: Diagnosis not present

## 2019-06-11 DIAGNOSIS — J449 Chronic obstructive pulmonary disease, unspecified: Secondary | ICD-10-CM | POA: Diagnosis not present

## 2019-06-11 LAB — PULMONARY FUNCTION TEST
DL/VA % pred: 63 %
DL/VA: 2.67 ml/min/mmHg/L
DLCO unc % pred: 50 %
DLCO unc: 8.6 ml/min/mmHg
FEF 25-75 Pre: 0.4 L/sec
FEF2575-%Pred-Pre: 29 %
FEV1-%Pred-Pre: 55 %
FEV1-Pre: 0.96 L
FEV1FVC-%Pred-Pre: 80 %
FEV6-%Pred-Pre: 70 %
FEV6-Pre: 1.56 L
FEV6FVC-%Pred-Pre: 102 %
FVC-%Pred-Pre: 68 %
FVC-Pre: 1.61 L
Pre FEV1/FVC ratio: 60 %
Pre FEV6/FVC Ratio: 97 %
RV % pred: 149 %
RV: 3.28 L
TLC % pred: 104 %
TLC: 4.8 L

## 2019-06-11 LAB — BASIC METABOLIC PANEL
BUN/Creatinine Ratio: 36 — ABNORMAL HIGH (ref 12–28)
BUN: 36 mg/dL — ABNORMAL HIGH (ref 8–27)
CO2: 28 mmol/L (ref 20–29)
Calcium: 9.6 mg/dL (ref 8.7–10.3)
Chloride: 100 mmol/L (ref 96–106)
Creatinine, Ser: 0.99 mg/dL (ref 0.57–1.00)
GFR calc Af Amer: 63 mL/min/{1.73_m2} (ref >59)
GFR calc non Af Amer: 55 mL/min/{1.73_m2} — ABNORMAL LOW (ref >59)
Glucose: 234 mg/dL — ABNORMAL HIGH (ref 65–99)
Potassium: 5.3 mmol/L — ABNORMAL HIGH (ref 3.5–5.2)
Sodium: 141 mmol/L (ref 134–144)

## 2019-06-11 LAB — BRAIN NATRIURETIC PEPTIDE: BNP: 253.2 pg/mL — ABNORMAL HIGH (ref 0.0–100.0)

## 2019-06-12 ENCOUNTER — Telehealth (HOSPITAL_COMMUNITY): Payer: Self-pay | Admitting: *Deleted

## 2019-06-12 ENCOUNTER — Other Ambulatory Visit (HOSPITAL_COMMUNITY): Payer: Self-pay | Admitting: *Deleted

## 2019-06-12 ENCOUNTER — Ambulatory Visit: Payer: Medicare Other | Admitting: Cardiology

## 2019-06-12 DIAGNOSIS — I5022 Chronic systolic (congestive) heart failure: Secondary | ICD-10-CM

## 2019-06-12 NOTE — Telephone Encounter (Signed)
-----   Message from Jolaine Artist, MD sent at 06/11/2019  8:17 PM EDT ----- Potassium up. Repeat BMET next Monday

## 2019-06-12 NOTE — Telephone Encounter (Signed)
Notes recorded by Harvie Junior, CMA on 06/12/2019 at 9:12 AM EDT  No answer will try patient again later.  ------   Notes recorded by Jolaine Artist, MD on 06/11/2019 at 8:17 PM EDT  Potassium up. Repeat BMET next Monday

## 2019-06-13 ENCOUNTER — Other Ambulatory Visit (HOSPITAL_COMMUNITY): Payer: Self-pay | Admitting: Internal Medicine

## 2019-06-16 ENCOUNTER — Other Ambulatory Visit: Payer: Self-pay

## 2019-06-16 ENCOUNTER — Telehealth (HOSPITAL_COMMUNITY): Payer: Self-pay

## 2019-06-16 ENCOUNTER — Other Ambulatory Visit: Payer: Medicare Other

## 2019-06-16 DIAGNOSIS — I5022 Chronic systolic (congestive) heart failure: Secondary | ICD-10-CM | POA: Diagnosis not present

## 2019-06-16 NOTE — Telephone Encounter (Signed)
Pt aware of results. Pt advised that referral to pulm is in place and she should expect a call.  Number provided to pulm office.

## 2019-06-16 NOTE — Telephone Encounter (Signed)
-----   Message from Jolaine Artist, MD sent at 06/14/2019  8:32 PM EDT ----- Severe COPD with FEV1 = 0.96L

## 2019-06-17 LAB — BASIC METABOLIC PANEL
BUN/Creatinine Ratio: 31 — ABNORMAL HIGH (ref 12–28)
BUN: 31 mg/dL — ABNORMAL HIGH (ref 8–27)
CO2: 27 mmol/L (ref 20–29)
Calcium: 9.6 mg/dL (ref 8.7–10.3)
Chloride: 98 mmol/L (ref 96–106)
Creatinine, Ser: 1.01 mg/dL — ABNORMAL HIGH (ref 0.57–1.00)
GFR calc Af Amer: 62 mL/min/{1.73_m2} (ref >59)
GFR calc non Af Amer: 53 mL/min/{1.73_m2} — ABNORMAL LOW (ref >59)
Glucose: 272 mg/dL — ABNORMAL HIGH (ref 65–99)
Potassium: 4.9 mmol/L (ref 3.5–5.2)
Sodium: 141 mmol/L (ref 134–144)

## 2019-06-19 ENCOUNTER — Encounter: Payer: Self-pay | Admitting: Internal Medicine

## 2019-06-19 ENCOUNTER — Other Ambulatory Visit: Payer: Self-pay

## 2019-06-19 ENCOUNTER — Ambulatory Visit: Payer: Medicare Other | Admitting: Internal Medicine

## 2019-06-19 DIAGNOSIS — F17210 Nicotine dependence, cigarettes, uncomplicated: Secondary | ICD-10-CM

## 2019-06-19 DIAGNOSIS — J449 Chronic obstructive pulmonary disease, unspecified: Secondary | ICD-10-CM

## 2019-06-19 NOTE — Progress Notes (Signed)
Hannah French, female    DOB: 1940/10/14,    MRN: [id number]   Brief patient profile:  2 yowf active smoker  On various bronchodilators but since Nov 2019 with GOLD II copd criteria established 06/11/19  referred to pulmonary clinic 06/19/2019 by Dr  Lind Covert.     History of Present Illness  06/19/2019  Pulmonary/ 1st office eval/  Chief Complaint  Patient presents with  . Pulmonary Consult    Referred by Dr. Jeffie Pollock. Pt c/o DOE since Nov 2019, worse over the past few months. She gets winded doing housework.  Dyspnea:  Worse since Nov 2019 / has learned to pace herself/ no shopping at all / but does mb most days and apparently not bothered enough by doe to use advair but "helps when she does use it"  - overall pattern is c/w MMRC2 = can't walk a nl pace on a flat grade s sob but does fine slow and flat  Cough: none  Sleep: no  resp symptoms,  wakes up with dry mouth but no cough/ wheeze SABA use: saba not sure helps, not consistent with advair aas noted above  No obvious day to day or daytime variability or assoc excess/ purulent sputum or mucus plugs or hemoptysis or cp or chest tightness, subjective wheeze or overt sinus or hb symptoms.   Sleeping as above  without nocturnal  or early am exacerbation  of respiratory  c/o's or need for noct saba. Also denies any obvious fluctuation of symptoms with weather or environmental changes or other aggravating or alleviating factors except as outlined above   No unusual exposure hx or h/o childhood pna/ asthma or knowledge of premature birth.  Current Allergies, Complete Past Medical History, Past Surgical History, Family History, and Social History were reviewed in [employer] record.  ROS  The following are not active complaints unless bolded Hoarseness, sore throat, dysphagia, dental problems, itching, sneezing,  nasal congestion or discharge of excess mucus or purulent secretions, ear ache,   fever,  chills, sweats, unintended wt loss or wt gain, classically pleuritic or exertional cp,  orthopnea pnd or arm/hand swelling  or leg swelling, presyncope, palpitations, abdominal pain, anorexia, nausea, vomiting, diarrhea  or change in bowel habits or change in bladder habits, change in stools or change in urine, dysuria, hematuria,  rash, arthralgias, visual complaints, headache, numbness, weakness or ataxia or problems with walking or coordination,  change in mood or  memory.           Past Medical History:  Diagnosis Date  . Abdominal bruit   . Bladder cancer ([place])   . CAD (coronary artery disease)    a. nonobstructive by cath 08/2018.  . Carotid bruit   . Chronic combined systolic and diastolic CHF (congestive heart failure) ([place])   . CKD (chronic kidney disease), stage II   . Diabetes mellitus    Type II  . Dyslipidemia   . Heart murmur   . Mild pulmonary hypertension ([place])   . Mitral valve prolapse    a. not seen on most recent echoes. Mild MR now.  Marland Kitchen NICM (nonischemic cardiomyopathy) ([place])   . Osteoporosis   . Pancreatitis, acute   . Pseudogout   . PVD (peripheral vascular disease) ([place])    a. 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  . Sinus tachycardia   . Tobacco abuse     Outpatient Medications Prior to Visit  Medication Sig Dispense Refill  .  blood glucose meter kit and supplies Use up to four times daily as directed 1 each 0  . Cholecalciferol (VITAMIN D) 50 MCG (2000 UT) tablet Take 6,000 Units by mouth daily.    . digoxin (LANOXIN) 0.125 MG tablet Take 1 tablet (0.125 mg total) by mouth daily. 90 tablet 3  . Fluticasone-Salmeterol (ADVAIR) 100-50 MCG/DOSE AEPB Inhale 1 puff into the lungs 2 (two) times daily. 1 each 3  . furosemide (LASIX) 20 MG tablet Take 1 tablet (20 mg total) by mouth daily. You may take an extra dose for increase weight gain or swelling 90 tablet 3  . glimepiride (AMARYL) 2 MG tablet Take 1-2 tablets  (2-4 mg total) by mouth daily with breakfast. 180 tablet 3  . glucose blood (ONETOUCH VERIO) test strip USE TO CHECK BLOOD GLUCOSE TWICE DAILY AND AS NEEDED 200 each 3  . ibuprofen (ADVIL,MOTRIN) 200 MG tablet Take 400 mg by mouth 2 (two) times daily as needed (for pain).     . metoprolol succinate (TOPROL-XL) 25 MG 24 hr tablet Take 25 mg by mouth daily.    Glory Rosebush DELICA LANCETS 46E MISC USE   TO CHECK GLUCOSE TWICE DAILY AND AS NEEDED 100 each 2  . vitamin B-12 (CYANOCOBALAMIN) 1000 MCG tablet Take 1,000 mcg by mouth daily.    Marland Kitchen doxycycline (VIBRA-TABS) 100 MG tablet Take 1 tablet (100 mg total) by mouth 2 (two) times daily. 20 tablet 0  . mupirocin ointment (BACTROBAN) 2 %     . predniSONE (STERAPRED UNI-PAK 21 TAB) 10 MG (21) TBPK tablet Use as directed 21 tablet 0      Objective:     BP 98/64 (BP Location: Left Arm, Cuff Size: Normal)   Pulse 96   Temp (!) 97.1 F (36.2 C) (Oral)   Ht 5' (1.524 m)   Wt 122 lb (55.3 kg)   SpO2 93% Comment: on RA  BMI 23.83 kg/m   SpO2: 93 %(on RA)   amb wf extremely hoh   HEENT : pt wearing mask not removed for exam due to covid - 19 concerns.    NECK :  without JVD/Nodes/TM/ nl carotid upstrokes bilaterally   LUNGS: no acc muscle use,  Mild barrel  contour chest wall with bilateral  Distant bs s audible wheeze and  without cough on insp or exp maneuvers  and mild  Hyperresonant  to  percussion bilaterally     CV:  RRR  no s3 or murmur or increase in P2, and no edema   ABD:  soft and nontender with pos end  insp Hoover's  in the supine position. No bruits or organomegaly appreciated, bowel sounds nl  MS:   Nl gait/  ext warm without deformities, calf tenderness, cyanosis or clubbing No obvious joint restrictions   SKIN: warm and dry without lesions    NEURO:  alert,  Not sure her communication problem is entirely hearing related though    no motor or cerebellar deficits apparent.        I personally reviewed images and  agree with radiology impression as follows:  CXR:   10/04/2018 Heart size is at the upper limits of normal. Aortic atherosclerosis. Stable pulmonary interstitial prominence and mild hyperinflation.      Assessment   COPD GOLD II / active smoker Active smoker - Spirometry 06/11/2019  FEV1 0.96 (55%)  Ratio 0.60 s prior rx and classic curvature DLCO 8.60(50%) corrects to 2.67 (63%)   She has Group B symptoms (  no tendency to exac yet) and might actually do better on anoro for limiting sob or trelegy if any concern with exac but most important is to stop smoking now    I reviewed the Fletcher curve with the patient and daughter  that basically indicates  if you quit smoking when your best day FEV1 is still   preserved (as is only relatively  the case here)  it is highly unlikely you will progress to endstage disease and informed the patient there was  no medication on the market that has proven to alter the curve/ its downward trajectory  or the likelihood of progression of their disease(unlike other chronic medical conditions such as atheroclerosis where we do think we can change the natural hx with risk reducing meds)    Therefore stopping smoking and maintaining abstinence are  the most important aspects of care, not choice of inhalers or for that matter, doctors.   Treatment other than smoking cessation  is entirely directed by severity of symptoms and focused also on reducing exacerbations, not attempting to change the natural history of the disease.   >>> continue advair for now or change to anoro for more symptoms, trelegy for more symptoms and/or more exac)   - The proper method of use, as well as anticipated side effects, of an elipta  inhaler were discussed and demonstrated to the patient.    Cigarette smoker Counseled re importance of smoking cessation but did not meet time criteria for separate billing      Total time devoted to counseling  > 50 % of initial 60 min office visit:   reviewed case with pt/daughter /  performed device teaching  using a teach back technique which also  extended face to face time for this visit (see above)  discussion of options/alternatives/ personally creating written customized instructions  in presence of pt  then going over those specific  Instructions directly with the pt including how to use all of the meds but in particular covering each new medication in detail and the difference between the maintenance= "automatic" meds and the prns using an action plan format for the latter (If this problem/symptom => do that organization reading Left to right).  Please see AVS from this visit for a full list of these instructions which I personally wrote for this pt and  are unique to this visit.      Christinia Gully, MD 06/19/2019

## 2019-06-19 NOTE — Assessment & Plan Note (Addendum)
Active smoker - Spirometry 06/11/2019  FEV1 0.96 (55%)  Ratio 0.60 s prior rx and classic curvature DLCO 8.60(50%) corrects to 2.67 (63%)   She has Group B symptoms (no tendency to exac yet) and might actually do better on anoro for limiting sob or trelegy if any concern with exac but most important is to stop smoking now    I reviewed the Fletcher curve with the patient and daughter  that basically indicates  if you quit smoking when your best day FEV1 is still   preserved (as is only relatively  the case here)  it is highly unlikely you will progress to endstage disease and informed the patient there was  no medication on the market that has proven to alter the curve/ its downward trajectory  or the likelihood of progression of their disease(unlike other chronic medical conditions such as atheroclerosis where we do think we can change the natural hx with risk reducing meds)    Therefore stopping smoking and maintaining abstinence are  the most important aspects of care, not choice of inhalers or for that matter, doctors.   Treatment other than smoking cessation  is entirely directed by severity of symptoms and focused also on reducing exacerbations, not attempting to change the natural history of the disease.   >>> continue advair for now or change to anoro for more symptoms, trelegy for more symptoms and/or more exac)   - The proper method of use, as well as anticipated side effects, of an elipta  inhaler were discussed and demonstrated to the patient.     Total time devoted to counseling  > 50 % of initial 60 min office visit:  reviewed case with pt/daughter /  performed device teaching  using a teach back technique which also  extended face to face time for this visit (see above)  discussion of options/alternatives/ personally creating written customized instructions  in presence of pt  then going over those specific  Instructions directly with the pt including how to use all of the meds but in  particular covering each new medication in detail and the difference between the maintenance= "automatic" meds and the prns using an action plan format for the latter (If this problem/symptom => do that organization reading Left to right).  Please see AVS from this visit for a full list of these instructions which I personally wrote for this pt and  are unique to this visit.

## 2019-06-19 NOTE — Assessment & Plan Note (Signed)
Counseled re importance of smoking cessation but did not meet time criteria for separate billing   °

## 2019-06-19 NOTE — Patient Instructions (Addendum)
You have a GOLD II severity  COPD = moderately severe and the treatment is to stop smoking before it gets any worse.  The medications like advair are useful just to treat the symptoms and make you fell better but do not change to course of the disease - clean air does for your lungs what a cholesterol pill does for your heart.   Advair is cheap, you have access to it and Dr Dettinger can always consider a more powerful alternative :  eg  change to anoro for more symptoms, trelegy for more symptoms and/or more exac)  Pulmonary follow up is as needed

## 2019-06-25 ENCOUNTER — Encounter (HOSPITAL_COMMUNITY): Payer: Medicare Other | Admitting: Internal Medicine

## 2019-06-26 ENCOUNTER — Telehealth: Payer: Self-pay | Admitting: Cardiovascular Disease

## 2019-06-26 NOTE — Telephone Encounter (Signed)
Noted! Thank you

## 2019-06-26 NOTE — Telephone Encounter (Signed)
  Patient has appt on 07/01/19 with Dr Fletcher Anon and she will need her daughter to come with her because she is hearing impaired

## 2019-06-26 NOTE — Telephone Encounter (Signed)
Informed pt that it shouldn't be an issue with daughter attending appointment due to her hearing impairment.  Will route to MD and nurse to make aware.

## 2019-06-30 ENCOUNTER — Encounter: Payer: Self-pay | Admitting: Family Medicine

## 2019-06-30 ENCOUNTER — Telehealth: Payer: Self-pay | Admitting: Family Medicine

## 2019-06-30 ENCOUNTER — Ambulatory Visit (INDEPENDENT_AMBULATORY_CARE_PROVIDER_SITE_OTHER): Payer: Medicare Other | Admitting: Family Medicine

## 2019-06-30 DIAGNOSIS — F320 Major depressive disorder, single episode, mild: Secondary | ICD-10-CM | POA: Diagnosis not present

## 2019-06-30 MED ORDER — FUROSEMIDE 20 MG PO TABS: 40.0000 mg | ORAL_TABLET | Freq: Every day | ORAL | 3 refills | Status: DC

## 2019-06-30 MED ORDER — SERTRALINE HCL 50 MG PO TABS: 50.0000 mg | ORAL_TABLET | Freq: Every day | ORAL | 1 refills | Status: DC

## 2019-06-30 NOTE — Progress Notes (Signed)
Virtual Visit via telephone Note  I connected with Hannah French on 06/30/19 at 1508 by telephone and verified that I am speaking with the correct person using two identifiers. Hannah French is currently located at home and no other people are currently with her during visit. The provider, Fransisca Kaufmann , MD is located in their office at time of visit.  Call ended at 1520  I discussed the limitations, risks, security and privacy concerns of performing an evaluation and management service by telephone and the availability of in person appointments. I also discussed with the patient that there may be a patient responsible charge related to this service. The patient expressed understanding and agreed to proceed.   History and Present Illness: Depression and anxiety Patient has been having a lot of anxiety and depression and she is having a lot of health issues herself and other family members. She feels like she is crying all of the time.  Her brother and close cousin are very ill.  Also being confined because of COVID is taking a toll as well. Patient denies any suicidal ideations.   No diagnosis found.  Outpatient Encounter Medications as of 06/30/2019  Medication Sig  . blood glucose meter kit and supplies Use up to four times daily as directed  . Cholecalciferol (VITAMIN D) 50 MCG (2000 UT) tablet Take 6,000 Units by mouth daily.  . digoxin (LANOXIN) 0.125 MG tablet Take 1 tablet (0.125 mg total) by mouth daily.  . Fluticasone-Salmeterol (ADVAIR) 100-50 MCG/DOSE AEPB Inhale 1 puff into the lungs 2 (two) times daily.  . furosemide (LASIX) 20 MG tablet Take 1 tablet (20 mg total) by mouth daily. You may take an extra dose for increase weight gain or swelling  . glimepiride (AMARYL) 2 MG tablet Take 1-2 tablets (2-4 mg total) by mouth daily with breakfast.  . glucose blood (ONETOUCH VERIO) test strip USE TO CHECK BLOOD GLUCOSE TWICE DAILY AND AS NEEDED  . ibuprofen  (ADVIL,MOTRIN) 200 MG tablet Take 400 mg by mouth 2 (two) times daily as needed (for pain).   . metoprolol succinate (TOPROL-XL) 25 MG 24 hr tablet Take 25 mg by mouth daily.  Glory Rosebush DELICA LANCETS 44Y MISC USE   TO CHECK GLUCOSE TWICE DAILY AND AS NEEDED  . vitamin B-12 (CYANOCOBALAMIN) 1000 MCG tablet Take 1,000 mcg by mouth daily.   No facility-administered encounter medications on file as of 06/30/2019.     Review of Systems  Constitutional: Negative for chills and fever.  Eyes: Negative for visual disturbance.  Respiratory: Negative for chest tightness and shortness of breath.   Cardiovascular: Negative for chest pain and leg swelling.  Musculoskeletal: Negative for back pain and gait problem.  Skin: Negative for rash.  Neurological: Negative for light-headedness and headaches.  Psychiatric/Behavioral: Positive for dysphoric mood. Negative for agitation, behavioral problems, self-injury, sleep disturbance and suicidal ideas. The patient is nervous/anxious.   All other systems reviewed and are negative.   Observations/Objective: Patient sounds comfortable and in no acute distress  Assessment and Plan: Problem List Items Addressed This Visit    None    Visit Diagnoses    Depression, major, single episode, mild ([place])    -  Primary   Relevant Medications   sertraline (ZOLOFT) 50 MG tablet       Follow Up Instructions: Follow up in 4-6 weeks.   will start zoloft 50 mg and reevaluate I discussed the assessment and treatment plan with the patient. The  patient was provided an opportunity to ask questions and all were answered. The patient agreed with the plan and demonstrated an understanding of the instructions.   The patient was advised to call back or seek an in-person evaluation if the symptoms worsen or if the condition fails to improve as anticipated.  The above assessment and management plan was discussed with the patient. The patient verbalized understanding of and  has agreed to the management plan. Patient is aware to call the clinic if symptoms persist or worsen. Patient is aware when to return to the clinic for a follow-up visit. Patient educated on when it is appropriate to go to the emergency department.    I provided 12 minutes of non-face-to-face time during this encounter.    Worthy Rancher, MD

## 2019-06-30 NOTE — Telephone Encounter (Signed)
Appointment scheduled today.

## 2019-07-01 ENCOUNTER — Ambulatory Visit: Payer: Medicare Other | Admitting: Cardiovascular Disease

## 2019-07-02 ENCOUNTER — Other Ambulatory Visit: Payer: Self-pay

## 2019-07-02 ENCOUNTER — Ambulatory Visit
Admission: RE | Admit: 2019-07-02 | Discharge: 2019-07-02 | Disposition: A | Discharge status: Home / Self Care | Payer: Medicare Other | Source: Ambulatory Visit | Attending: Family Medicine | Admitting: Family Medicine

## 2019-07-02 DIAGNOSIS — Z1231 Encounter for screening mammogram for malignant neoplasm of breast: Secondary | ICD-10-CM

## 2019-07-11 ENCOUNTER — Telehealth: Payer: Self-pay | Admitting: Family Medicine

## 2019-07-11 DIAGNOSIS — J441 Chronic obstructive pulmonary disease with (acute) exacerbation: Secondary | ICD-10-CM

## 2019-07-11 NOTE — Telephone Encounter (Signed)
Pt is having extreme diarrhea and stomach pains. All food is going through her. Lost 4 lbs  Been on since 06/30/19. This started the very week.  Pt prefers this wait for Dettinger ( offered to sent to back up) Aware may be Monday.

## 2019-07-14 MED ORDER — FLUTICASONE-SALMETEROL 100-50 MCG/DOSE IN AEPB: 1.0000 | INHALATION_SPRAY | Freq: Two times a day (BID) | RESPIRATORY_TRACT | 3 refills | Status: DC

## 2019-07-14 NOTE — Telephone Encounter (Signed)
Patient is keeping her stomach upset all of the time. She started having this issue about 3 days after starting the zoloft. She also started digoxin. Recommended she take 1/2 tablet after dinner.  Recommended to take with food. She feels gassy and see if improves.

## 2019-07-14 NOTE — Telephone Encounter (Signed)
Refilled advair.

## 2019-07-16 ENCOUNTER — Other Ambulatory Visit (HOSPITAL_COMMUNITY): Payer: Self-pay | Admitting: *Deleted

## 2019-07-16 ENCOUNTER — Other Ambulatory Visit: Payer: Self-pay

## 2019-07-16 ENCOUNTER — Encounter (HOSPITAL_COMMUNITY): Payer: Self-pay | Admitting: Internal Medicine

## 2019-07-16 ENCOUNTER — Ambulatory Visit (HOSPITAL_COMMUNITY)
Admission: RE | Admit: 2019-07-16 | Discharge: 2019-07-16 | Disposition: A | Discharge status: Home / Self Care | Payer: Medicare Other | Source: Ambulatory Visit | Attending: Internal Medicine | Admitting: Internal Medicine

## 2019-07-16 VITALS — BP 100/51 | HR 86 | Wt 120.0 lb

## 2019-07-16 DIAGNOSIS — I272 Pulmonary hypertension, unspecified: Secondary | ICD-10-CM | POA: Diagnosis not present

## 2019-07-16 DIAGNOSIS — E875 Hyperkalemia: Secondary | ICD-10-CM | POA: Diagnosis not present

## 2019-07-16 DIAGNOSIS — M810 Age-related osteoporosis without current pathological fracture: Secondary | ICD-10-CM | POA: Diagnosis not present

## 2019-07-16 DIAGNOSIS — I5042 Chronic combined systolic (congestive) and diastolic (congestive) heart failure: Secondary | ICD-10-CM | POA: Diagnosis not present

## 2019-07-16 DIAGNOSIS — I739 Peripheral vascular disease, unspecified: Secondary | ICD-10-CM | POA: Diagnosis not present

## 2019-07-16 DIAGNOSIS — I2510 Atherosclerotic heart disease of native coronary artery without angina pectoris: Secondary | ICD-10-CM | POA: Diagnosis not present

## 2019-07-16 DIAGNOSIS — I428 Other cardiomyopathies: Secondary | ICD-10-CM | POA: Diagnosis not present

## 2019-07-16 DIAGNOSIS — I5022 Chronic systolic (congestive) heart failure: Secondary | ICD-10-CM | POA: Diagnosis not present

## 2019-07-16 DIAGNOSIS — F17210 Nicotine dependence, cigarettes, uncomplicated: Secondary | ICD-10-CM | POA: Insufficient documentation

## 2019-07-16 DIAGNOSIS — E1151 Type 2 diabetes mellitus with diabetic peripheral angiopathy without gangrene: Secondary | ICD-10-CM | POA: Insufficient documentation

## 2019-07-16 DIAGNOSIS — Z90711 Acquired absence of uterus with remaining cervical stump: Secondary | ICD-10-CM | POA: Diagnosis not present

## 2019-07-16 DIAGNOSIS — J449 Chronic obstructive pulmonary disease, unspecified: Secondary | ICD-10-CM | POA: Insufficient documentation

## 2019-07-16 DIAGNOSIS — I959 Hypotension, unspecified: Secondary | ICD-10-CM | POA: Insufficient documentation

## 2019-07-16 DIAGNOSIS — R011 Cardiac murmur, unspecified: Secondary | ICD-10-CM | POA: Insufficient documentation

## 2019-07-16 DIAGNOSIS — Z8551 Personal history of malignant neoplasm of bladder: Secondary | ICD-10-CM | POA: Diagnosis not present

## 2019-07-16 DIAGNOSIS — Z7951 Long term (current) use of inhaled steroids: Secondary | ICD-10-CM | POA: Insufficient documentation

## 2019-07-16 DIAGNOSIS — Z7984 Long term (current) use of oral hypoglycemic drugs: Secondary | ICD-10-CM | POA: Insufficient documentation

## 2019-07-16 DIAGNOSIS — I341 Nonrheumatic mitral (valve) prolapse: Secondary | ICD-10-CM | POA: Diagnosis not present

## 2019-07-16 DIAGNOSIS — E1122 Type 2 diabetes mellitus with diabetic chronic kidney disease: Secondary | ICD-10-CM | POA: Diagnosis not present

## 2019-07-16 DIAGNOSIS — I771 Stricture of artery: Secondary | ICD-10-CM | POA: Diagnosis not present

## 2019-07-16 DIAGNOSIS — Z8249 Family history of ischemic heart disease and other diseases of the circulatory system: Secondary | ICD-10-CM | POA: Insufficient documentation

## 2019-07-16 DIAGNOSIS — Z79899 Other long term (current) drug therapy: Secondary | ICD-10-CM | POA: Diagnosis not present

## 2019-07-16 DIAGNOSIS — I447 Left bundle-branch block, unspecified: Secondary | ICD-10-CM | POA: Insufficient documentation

## 2019-07-16 LAB — BASIC METABOLIC PANEL
Anion gap: 11 (ref 5–15)
BUN: 14 mg/dL (ref 8–23)
CO2: 27 mmol/L (ref 22–32)
Calcium: 9.2 mg/dL (ref 8.9–10.3)
Chloride: 94 mmol/L — ABNORMAL LOW (ref 98–111)
Creatinine, Ser: 1.03 mg/dL — ABNORMAL HIGH (ref 0.44–1.00)
GFR calc Af Amer: 60 mL/min — ABNORMAL LOW (ref >60)
GFR calc non Af Amer: 52 mL/min — ABNORMAL LOW (ref >60)
Glucose, Bld: 249 mg/dL — ABNORMAL HIGH (ref 70–99)
Potassium: 3.9 mmol/L (ref 3.5–5.1)
Sodium: 132 mmol/L — ABNORMAL LOW (ref 135–145)

## 2019-07-16 LAB — DIGOXIN LEVEL: Digoxin Level: 0.9 ng/mL (ref 0.8–2.0)

## 2019-07-16 MED ORDER — DAPAGLIFLOZIN PROPANEDIOL 10 MG PO TABS: 10.0000 mg | ORAL_TABLET | Freq: Every day | ORAL | 6 refills | Status: DC

## 2019-07-16 NOTE — Progress Notes (Signed)
ADVANCED HF CLINIC NOTE  Date:  07/16/2019   ID:  Cortney Beissel, DOB 13-Aug-1941, MRN [id number]  PCP:  Dettinger, Fransisca Kaufmann, MD  Cardiologist:  Sherren Mocha, MD  Electrophysiologist:  None   Referring Provider: Melina Copa PA-C  Chief Complaint:  HF  History of Present Illness:   Krysti Hickling is a 78 y.o. 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   She was seen in the office on 08/26/2018 by Dr. Burt Knack and was noted to have shortness of breath felt to be related to COPD and URI. She did have a progression on her EKG from previous IVCD to left bundle branch block. An echo was done which showed a decline in LV function with EF 30-35% with diffuse hypokinesis (prior was normal). With new LBBB and significant change in LV function she was sent for right and left heart cath which was done on 09/16/2018 which showed nonobstructive CAD with diffuse luminal irregularities involving the left main, left circumflex and RCA. There was 40-50% calcific stenosis of the proximal LAD. There was mild pulmonary hypertension and mild elevation in LVEDP with preserved cardiac output. Medical therapy was recommended.. She is not able to take statin drugs or aspirin because of intolerance to both medicines. She has stated that she is a "free bleeder" and cannot tolerate any antiplatelet therapy.   Her medical therapy has been limited by low BP and dizziness (beta blocker reduced, losartan stopped). Corlanor was started for elevated HR and soft BP but not yet approved, F/u echo 04/28/19 showed EF 25-30%, diffuse HK, Grade II DD,  mildly dilated LA, mild MR.   I saw her for the first  time in the HF Clinic in 8/20. Was doing fairly well. Felt SOB mostly due to COPD. Hall walk with sats 93-96%. Unable to titrate HF meds due to low BP and dizziness  Returns for f/u. Since we last saw her had PFTs with severe COPD. Says she feels a bit better. Main issue is RLE claudication. Has f/u with Dr. Fletcher Anon soon. Occasional rest pain. No ulcers. Says breathing is better. Does have some SOB but claudication is limiting more. No CP. Still smoking 3/4 ppd. No dizziness. No LE edema.   PFTs 06/11/19 FEV1 0.96L (55%) FVC 1.61L (68%) Ratio60% FEF25-75  0.40 (29%) DLCO 50%   Past Medical History:  Diagnosis Date  . Abdominal bruit   . Bladder cancer ([place])   . CAD (coronary artery disease)    a. nonobstructive by cath 08/2018.  . Carotid bruit   . Chronic combined systolic and diastolic CHF (congestive heart failure) ([place])   . CKD (chronic kidney disease), stage II   . Diabetes mellitus    Type II  . Dyslipidemia   . Heart murmur   . Mild pulmonary hypertension ([place])   . Mitral valve prolapse    a. not seen on most recent echoes. Mild MR now.  Marland Kitchen NICM (nonischemic cardiomyopathy) ([place])   . Osteoporosis   . Pancreatitis, acute   . Pseudogout   . PVD (peripheral vascular disease) ([place])    a. 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  . Sinus tachycardia   . Tobacco abuse  Past Surgical History:  Procedure Laterality Date  . APPENDECTOMY    . Hysterectomy-type unspecified    . LAPAROSCOPIC CHOLECYSTECTOMY  2009  . RIGHT/LEFT HEART CATH AND CORONARY ANGIOGRAPHY N/A 09/16/2018   Procedure: RIGHT/LEFT HEART CATH AND CORONARY ANGIOGRAPHY;  Surgeon: Sherren Mocha, MD;  Location: [facility];  Service: Cardiovascular;  Laterality: N/A;    Current Medications: Current Meds  Medication Sig  . blood glucose meter kit and supplies Use up to four times daily as directed  . Cholecalciferol (VITAMIN D) 50 MCG (2000 UT) tablet  Take 6,000 Units by mouth daily.  . digoxin (LANOXIN) 0.125 MG tablet Take 1 tablet (0.125 mg total) by mouth daily.  . Fluticasone-Salmeterol (ADVAIR) 100-50 MCG/DOSE AEPB Inhale 1 puff into the lungs 2 (two) times daily.  . furosemide (LASIX) 20 MG tablet Take 2 tablets (40 mg total) by mouth daily. You may take an extra dose for increase weight gain or swelling  . glimepiride (AMARYL) 2 MG tablet Take 1-2 tablets (2-4 mg total) by mouth daily with breakfast.  . glucose blood (ONETOUCH VERIO) test strip USE TO CHECK BLOOD GLUCOSE TWICE DAILY AND AS NEEDED  . ibuprofen (ADVIL,MOTRIN) 200 MG tablet Take 400 mg by mouth 2 (two) times daily as needed (for pain).   . metoprolol succinate (TOPROL-XL) 25 MG 24 hr tablet Take 25 mg by mouth daily.  Glory Rosebush DELICA LANCETS 29H MISC USE   TO CHECK GLUCOSE TWICE DAILY AND AS NEEDED  . sertraline (ZOLOFT) 50 MG tablet Take 25 mg by mouth daily.  . [DISCONTINUED] sertraline (ZOLOFT) 50 MG tablet Take 1 tablet (50 mg total) by mouth daily. (Patient taking differently: Take 25 mg by mouth daily. )     Allergies:   Contrast media [iodinated diagnostic agents], Iohexol, Metformin, Pravastatin, Albuterol, Bextra [valdecoxib], Cefdinir, Cozaar [losartan potassium], Nsaids, Statins, Sulfa antibiotics, Zetia [ezetimibe], and Rofecoxib   Social History   Socioeconomic History  . Marital status: Divorced    Spouse name: Not on file  . Number of children: 2  . Years of education: Not on file  . Highest education level: Not on file  Occupational History  . Occupation: Retired  Scientific laboratory technician  . Financial resource strain: Not on file  . Food insecurity    Worry: Not on file    Inability: Not on file  . Transportation needs    Medical: Not on file    Non-medical: Not on file  Tobacco Use  . Smoking status: Current Every Day Smoker    Packs/day: 1.00    Years: 50.00    Pack years: 50.00    Types: Cigarettes  . Smokeless tobacco: Never Used  Substance  and Sexual Activity  . Alcohol use: No  . Drug use: No  . Sexual activity: Not on file  Lifestyle  . Physical activity    Days per week: Not on file    Minutes per session: Not on file  . Stress: Not on file  Relationships  . Social Herbalist on phone: Not on file    Gets together: Not on file    Attends religious service: Not on file    Active member of club or organization: Not on file    Attends meetings of clubs or organizations: Not on file    Relationship status: Not on file  Other Topics Concern  . Not on file  Social History Narrative  . Not on file     Family  History:  [name] family history includes Heart attack in her brother and maternal uncle; Heart failure in her mother; Hypertension in her brother and sister. There is no history of Coronary artery disease.   EKGs/Labs/Other Studies Reviewed:    Studies reviewed were summarized above.   EKG:  EKG 05/14/19, personally reviewed, demonstrating NSR 94bpm with known LBBB (QRS 172m)  similar to prior  Recent Labs: 08/13/2018: Magnesium 1.7 10/04/2018: NT-Pro BNP 4,124 01/29/2019: ALT 5; Hemoglobin 12.0; Platelets 306 06/09/2019: BNP 253.2 06/16/2019: BUN 31; Creatinine, Ser 1.01; Potassium 4.9; Sodium 141  Recent Lipid Panel    Component Value Date/Time   CHOL 155 01/29/2019 0910   TRIG 100 01/29/2019 0910   TRIG 82 06/09/2014 1028   HDL 42 01/29/2019 0910   HDL 57 06/09/2014 1028   CHOLHDL 3.7 01/29/2019 0910   CHOLHDL 3.5 02/06/2013 0932   VLDL 20 02/06/2013 0932   LDLCALC 93 01/29/2019 0910   LDLCALC 131 (H) 06/09/2014 1028    PHYSICAL EXAM:    VS:  BP (!) 100/51   Pulse 86   Wt 54.4 kg (120 lb)   SpO2 96%   BMI 23.44 kg/m   BMI: Body mass index is 23.44 kg/m.  General:  Elderly weak appearing. Severe HOH No resp difficulty HEENT: normal Neck: supple. JVP 6. Carotids 2+ bilat; + bruits. No lymphadenopathy or thryomegaly appreciated. Cor: PMI nondisplaced. Regular rate & rhythm.  2/6 AS Lungs: coarse with decreased BS throguhout Abdomen: soft, nontender, nondistended. No hepatosplenomegaly. No bruits or masses. Good bowel sounds. Extremities: no cyanosis, clubbing, rash, edema Neuro: alert & orientedx3, cranial nerves grossly intact. moves all 4 extremities w/o difficulty. Affect pleasant    Wt Readings from Last 3 Encounters:  07/16/19 54.4 kg (120 lb)  06/19/19 55.3 kg (122 lb)  05/26/19 55.2 kg (121 lb 9.6 oz)     ASSESSMENT & PLAN:   1. Chronic systolic HF  - due to NICM (? LBBB but not very wide)  - Echo 12/19 EF 30-35% with diffuse hypokinesis - Echo  8/20 EF 25-30% with Grade II DD - Cath 12/19 nonobstructive CAD - Stable NYHA III-IIIb compounded by severe PAD and probable end-stage COPD - Volume status much improved on  lasix 40 daily  - Intolerant of meds due to low BP and dizziness (She does have a BP differential between arms so should likely be using the right arm to obtain accurate readings) - Continue Toprol 25 daily - Continue digoxin 0.125 daily - Losartan stopped due to low BP and hyperkalemia - K remains high so cannot restart currently. Can consider Veltassa - Corlanor ordered but did not get approved - Add Farxiga 10  - PFTs quite severe and likely major limiting factor (with PAD) - Not interested in ICD or CRT. (cancelled appt with EP) - Check BMET and digoxine level today  2. PAD with left subclavian artery stenosis and severe iliac disease - has f/u with Dr. AFletcher Anon 3. CAD - nonobstructive by prior cath. Refuses statins and ASA - no s/s ischemia  4. COPD with ongoing tobacco use - encouraged to quit  - PFTs 9/20 with severe COPD  5. DM2 - start Farxiga. Warned about risk of yeast infections  6. AS murmur - significant AS murmur on exam but no significant AS by echo     Signed, DGlori Bickers MD  07/16/2019 2:25 PM    [facility] [address] [place]  [id number]: [phone]  [phone]; Fax: [phone]

## 2019-07-16 NOTE — Patient Instructions (Signed)
START Farxiga 10mg  (1 tab) daily  Labs today We will only contact you if something comes back abnormal or we need to make some changes. Otherwise no news is good news!   Your physician recommends that you schedule a follow-up appointment in: 4 months with Dr Haroldine Laws  At the [facility], you and your health needs are our priority. As part of our continuing mission to provide you with exceptional heart care, we have created designated Provider Care Teams. These Care Teams include your primary Cardiologist (physician) and Advanced Practice Providers (APPs- Physician Assistants and Nurse Practitioners) who all work together to provide you with the care you need, when you need it.   You may see any of the following providers on your designated Care Team at your next follow up: Marland Kitchen Dr Glori Bickers . Dr Loralie Champagne . Darrick Grinder, NP . Lyda Jester, PA   Please be sure to bring in all your medications bottles to every appointment.

## 2019-07-17 ENCOUNTER — Telehealth (HOSPITAL_COMMUNITY): Payer: Self-pay | Admitting: Pharmacist

## 2019-07-17 ENCOUNTER — Telehealth (HOSPITAL_COMMUNITY): Payer: Self-pay

## 2019-07-17 MED ORDER — EMPAGLIFLOZIN 10 MG PO TABS: 10.0000 mg | ORAL_TABLET | Freq: Every day | ORAL | 6 refills | Status: DC

## 2019-07-17 MED ORDER — DIGOXIN 125 MCG PO TABS: 0.0625 mg | ORAL_TABLET | Freq: Every day | ORAL | 3 refills | Status: DC

## 2019-07-17 NOTE — Telephone Encounter (Signed)
Received notice that Wilder Glade was not preferred on patient's insurance plan. Spoke with Dr. Haroldine Laws and will change therapy to Jardiance 10 mg daily. Copay $47.00. New prescription for Jardiance sent to [facility].  Audry Riles, PharmD, BCPS, CPP Heart Failure Clinic Pharmacist [phone]

## 2019-07-17 NOTE — Telephone Encounter (Signed)
Pt aware to cut digoxin in half.  New Rx sent to pharmacy.  Verbalized understanding. Pt also noted that farxiga was not covered by insurance. Message sent to PharmD Sherron Monday to assist.

## 2019-07-17 NOTE — Telephone Encounter (Signed)
-----   Message from Jolaine Artist, MD sent at 07/16/2019  6:50 PM EDT ----- Please have her cut digoxin in half

## 2019-07-21 ENCOUNTER — Telehealth: Payer: Self-pay | Admitting: Family Medicine

## 2019-07-21 DIAGNOSIS — M542 Cervicalgia: Secondary | ICD-10-CM | POA: Diagnosis not present

## 2019-07-21 DIAGNOSIS — G44209 Tension-type headache, unspecified, not intractable: Secondary | ICD-10-CM | POA: Diagnosis not present

## 2019-07-21 NOTE — Telephone Encounter (Signed)
Patient was advised we could not see her in office would have to be a televisit. She stated she wanted Dettinger advised he could not call till Thursday offered call with other providers who had opening she declined stated she would go to urgent care.

## 2019-08-05 ENCOUNTER — Ambulatory Visit: Payer: Medicare Other | Admitting: Family Medicine

## 2019-08-07 ENCOUNTER — Telehealth: Payer: Self-pay | Admitting: Family Medicine

## 2019-08-07 ENCOUNTER — Other Ambulatory Visit: Payer: Self-pay

## 2019-08-07 NOTE — Chronic Care Management (AMB) (Signed)
Chronic Care Management  ° °Note ° °08/07/2019 °Name: [name] MRN: [id number] DOB: 01/18/1941 ° °[name] is a 78 y.o. year old female who is a primary care patient of Dettinger, Joshua A, MD. I reached out to Temprance N Necaise by phone today in response to a referral sent by Ms. Latrelle N Lookabaugh's health plan.    ° °Ms. Felber was given information about Chronic Care Management services today including:  °1. CCM service includes personalized support from designated clinical staff supervised by her physician, including individualized plan of care and coordination with other care providers °2. 24/7 contact phone numbers for assistance for urgent and routine care needs. °3. Service will only be billed when office clinical staff spend 20 minutes or more in a month to coordinate care. °4. Only one practitioner may furnish and bill the service in a calendar month. °5. The patient may stop CCM services at any time (effective at the end of the month) by phone call to the office staff. °6. The patient will be responsible for cost sharing (co-pay) of up to 20% of the service fee (after annual deductible is met). ° °Patient agreed to services and verbal consent obtained.  ° °Follow up plan: °Telephone appointment with CCM team member scheduled for: 08/25/2019 ° °Bernice Cicero °Care Guide, Embedded Care Coordination °[facility]  °[place], [place] [address] °Direct Dial: [phone] °[email or web address]  °Website: [email or web address]  ° ° °

## 2019-08-08 ENCOUNTER — Encounter: Payer: Self-pay | Admitting: Family Medicine

## 2019-08-08 ENCOUNTER — Ambulatory Visit (INDEPENDENT_AMBULATORY_CARE_PROVIDER_SITE_OTHER): Payer: Medicare Other | Admitting: Family Medicine

## 2019-08-08 VITALS — BP 137/68 | HR 94 | Temp 96.6°F | Ht 61.0 in | Wt 116.4 lb

## 2019-08-08 DIAGNOSIS — Z23 Encounter for immunization: Secondary | ICD-10-CM | POA: Diagnosis not present

## 2019-08-08 DIAGNOSIS — E1169 Type 2 diabetes mellitus with other specified complication: Secondary | ICD-10-CM | POA: Diagnosis not present

## 2019-08-08 DIAGNOSIS — F339 Major depressive disorder, recurrent, unspecified: Secondary | ICD-10-CM | POA: Diagnosis not present

## 2019-08-08 DIAGNOSIS — E559 Vitamin D deficiency, unspecified: Secondary | ICD-10-CM

## 2019-08-08 DIAGNOSIS — E785 Hyperlipidemia, unspecified: Secondary | ICD-10-CM | POA: Diagnosis not present

## 2019-08-08 LAB — BAYER DCA HB A1C WAIVED: HB A1C (BAYER DCA - WAIVED): 7.9 % — ABNORMAL HIGH (ref <7.0)

## 2019-08-08 NOTE — Addendum Note (Signed)
Addended by: Nigel Berthold C on: 08/08/2019 02:10 PM   Modules accepted: Orders

## 2019-08-08 NOTE — Progress Notes (Signed)
BP 137/68    Pulse 94    Temp (!) 96.6 F (35.9 C) (Temporal)    Ht _0  (1.549 m)    Wt 116 lb 6.4 oz (52.8 kg)    SpO2 94%    BMI 21.99 kg/m    Subjective:   Patient ID: Hannah French, female    DOB: [birth year], 78 y.o.   MRN: [id number]  HPI: Hannah French is a 78 y.o. female presenting on 08/08/2019 for Depression (1 month follow up)   HPI Depression Patient is coming in for depression anxiety recheck.  She currently is on the Zoloft and cut it in half to 25 mg because of her stomach issues and she feels like she is doing a lot better with that.  She has been doing that for a month and she is pretty satisfied wants to continue on the 25 mg.  She denies any suicidal ideations or thoughts of hurting self.  She is feeling relatively very content right now Depression screen [facility] 2/9 08/08/2019 03/10/2019 09/03/2018 08/21/2018 05/31/2018  Decreased Interest 0 0 0 0 0  Down, Depressed, Hopeless 0 0 0 0 0  PHQ - 2 Score 0 0 0 0 0    Type 2 diabetes mellitus Patient comes in today for recheck of his diabetes. Patient has been currently taking already and and glimepiride. Patient is not currently on an ACE inhibitor/ARB. Patient has not seen an ophthalmologist this year. Patient denies any issues with their feet.   Hypertension Patient is currently on digoxin and metoprolol, this is mostly managed by Dr. Haroldine Laws her cardiologist, and their blood pressure today is 37/68. Patient denies any lightheadedness or dizziness. Patient denies headaches, blurred vision, chest pains, shortness of breath, or weakness. Denies any side effects from medication and is content with current medication.   Hyperlipidemia Patient is coming in for recheck of his hyperlipidemia. The patient is currently taking no medication currently we are monitoring, sees Dr. Haroldine Laws cardiology. They deny any issues with myalgias or history of liver damage from it. They deny any focal numbness or weakness or  chest pain.   Patient has Dr. Melvyn Novas for COPD and feels like it is managed and doing okay  Relevant past medical, surgical, family and social history reviewed and updated as indicated. Interim medical history since our last visit reviewed. Allergies and medications reviewed and updated.  Review of Systems  Constitutional: Negative for chills and fever.  HENT: Positive for congestion. Negative for ear discharge and ear pain.   Eyes: Negative for redness and visual disturbance.  Respiratory: Positive for cough and wheezing. Negative for chest tightness and shortness of breath.   Cardiovascular: Negative for chest pain and leg swelling.  Gastrointestinal: Negative for abdominal pain.  Musculoskeletal: Negative for back pain and gait problem.  Skin: Negative for rash.  Neurological: Negative for light-headedness and headaches.  Psychiatric/Behavioral: Negative for agitation and behavioral problems.  All other systems reviewed and are negative.   Per HPI unless specifically indicated above   Allergies as of 08/08/2019      Reactions   Contrast Media [iodinated Diagnostic Agents] Shortness Of Breath   Iohexol Other (See Comments)   PASSED OUT DURING THE TEST   Metformin Swelling   Face and hands became swollen   Pravastatin Swelling, Other (See Comments)   Myalgia, also   Statins Other (See Comments), Swelling   They make the patient hurt "all over" They make the patient hurt "all over"  Myalgia Myalgia, also   Albuterol Other (See Comments)   "Takes the skin off of the inside of my mouth"   Bextra [valdecoxib] Nausea And Vomiting, Swelling   Shut down my kidneys    Cefdinir Other (See Comments)   Pancreatitis   Cozaar [losartan Potassium] Other (See Comments)   dizziness   Nsaids Other (See Comments)   GI intolerance and pain all over, tolerates ibu GI intolerance and pain all over, tolerates ibu   Zetia [ezetimibe] Swelling   Rofecoxib Other (See Comments)   "VIOXX"- "SHUT  DOWN MY KIDNEYS"   Sulfa Antibiotics Nausea And Vomiting, Other (See Comments)   Pancreatitis Pancreatitis      Medication List       Accurate as of August 08, 2019  1:30 PM. If you have any questions, ask your nurse or doctor.        blood glucose meter kit and supplies Use up to four times daily as directed   digoxin 0.125 MG tablet Commonly known as: LANOXIN Take 0.5 tablets (0.0625 mg total) by mouth daily.   empagliflozin 10 MG Tabs tablet Commonly known as: JARDIANCE Take 10 mg by mouth daily before breakfast.   Fluticasone-Salmeterol 100-50 MCG/DOSE Aepb Commonly known as: ADVAIR Inhale 1 puff into the lungs 2 (two) times daily.   furosemide 20 MG tablet Commonly known as: LASIX Take 2 tablets (40 mg total) by mouth daily. You may take an extra dose for increase weight gain or swelling   glimepiride 2 MG tablet Commonly known as: AMARYL Take 1-2 tablets (2-4 mg total) by mouth daily with breakfast.   glucose blood test strip Commonly known as: OneTouch Verio USE TO CHECK BLOOD GLUCOSE TWICE DAILY AND AS NEEDED   ibuprofen 200 MG tablet Commonly known as: ADVIL Take 400 mg by mouth 2 (two) times daily as needed (for pain).   metoprolol succinate 25 MG 24 hr tablet Commonly known as: TOPROL-XL Take 25 mg by mouth daily.   OneTouch Delica Lancets 63W Misc USE   TO CHECK GLUCOSE TWICE DAILY AND AS NEEDED   sertraline 50 MG tablet Commonly known as: ZOLOFT Take 25 mg by mouth daily.   Vitamin D 50 MCG (2000 UT) tablet Take 6,000 Units by mouth daily.        Objective:   BP 137/68    Pulse 94    Temp (!) 96.6 F (35.9 C) (Temporal)    Ht _0  (1.549 m)    Wt 116 lb 6.4 oz (52.8 kg)    SpO2 94%    BMI 21.99 kg/m   Wt Readings from Last 3 Encounters:  08/08/19 116 lb 6.4 oz (52.8 kg)  07/16/19 120 lb (54.4 kg)  06/19/19 122 lb (55.3 kg)    Physical Exam Vitals signs and nursing note reviewed.  Constitutional:      General: She is not in  acute distress.    Appearance: She is well-developed. She is not diaphoretic.  Eyes:     Conjunctiva/sclera: Conjunctivae normal.  Cardiovascular:     Rate and Rhythm: Normal rate and regular rhythm.     Heart sounds: Normal heart sounds. No murmur.  Pulmonary:     Effort: Pulmonary effort is normal. No respiratory distress.     Breath sounds: Normal breath sounds. No wheezing.  Skin:    General: Skin is warm and dry.     Findings: No rash.  Neurological:     Mental Status: She is alert and oriented to  person, place, and time.     Coordination: Coordination normal.  Psychiatric:        Behavior: Behavior normal.       Assessment & Plan:   Problem List Items Addressed This Visit      Endocrine   Hyperlipidemia associated with type 2 diabetes mellitus ([place]) - Primary   Relevant Orders   Lipid panel   Type 2 diabetes mellitus ([place])   Relevant Orders   CBC with Differential/Platelet   CMP14+EGFR   hgba1c     Other   Vitamin D deficiency   Relevant Orders   Vitamin D 25 hydroxy   Depression, recurrent ([place])   Relevant Orders   CBC with Differential/Platelet    Continue current medication, sounds like cardiology just started Jardiance a month ago which should be helpful with the blood sugars, hopefully the come down, we will check her A1c as well as her blood work today.  Depression is going well so continue with 25 mg of Zoloft daily.  Follow up plan: Return in about 3 months (around 11/08/2019), or if symptoms worsen or fail to improve, for Diabetes hypertension depression.  Counseling provided for all of the vaccine components Orders Placed This Encounter  Procedures   CBC with Differential/Platelet   CMP14+EGFR   hgba1c   Lipid panel   Vitamin D 25 hydroxy    Caryl Pina, MD [facility] 08/08/2019, 1:30 PM

## 2019-08-09 LAB — CMP14+EGFR
ALT: 8 IU/L (ref 0–32)
AST: 14 IU/L (ref 0–40)
Albumin/Globulin Ratio: 1.6 (ref 1.2–2.2)
Albumin: 4.2 g/dL (ref 3.7–4.7)
Alkaline Phosphatase: 65 IU/L (ref 39–117)
BUN/Creatinine Ratio: 16 (ref 12–28)
BUN: 19 mg/dL (ref 8–27)
Bilirubin Total: 0.5 mg/dL (ref 0.0–1.2)
CO2: 25 mmol/L (ref 20–29)
Calcium: 9.4 mg/dL (ref 8.7–10.3)
Chloride: 98 mmol/L (ref 96–106)
Creatinine, Ser: 1.22 mg/dL — ABNORMAL HIGH (ref 0.57–1.00)
GFR calc Af Amer: 49 mL/min/{1.73_m2} — ABNORMAL LOW (ref >59)
GFR calc non Af Amer: 42 mL/min/{1.73_m2} — ABNORMAL LOW (ref >59)
Globulin, Total: 2.7 g/dL (ref 1.5–4.5)
Glucose: 190 mg/dL — ABNORMAL HIGH (ref 65–99)
Potassium: 4.5 mmol/L (ref 3.5–5.2)
Sodium: 139 mmol/L (ref 134–144)
Total Protein: 6.9 g/dL (ref 6.0–8.5)

## 2019-08-09 LAB — CBC WITH DIFFERENTIAL/PLATELET
Basophils Absolute: 0.1 10*3/uL (ref 0.0–0.2)
Basos: 1 %
EOS (ABSOLUTE): 0.2 10*3/uL (ref 0.0–0.4)
Eos: 2 %
Hematocrit: 36.1 % (ref 34.0–46.6)
Hemoglobin: 12.3 g/dL (ref 11.1–15.9)
Immature Grans (Abs): 0.1 10*3/uL (ref 0.0–0.1)
Immature Granulocytes: 1 %
Lymphocytes Absolute: 1.8 10*3/uL (ref 0.7–3.1)
Lymphs: 16 %
MCH: 33.3 pg — ABNORMAL HIGH (ref 26.6–33.0)
MCHC: 34.1 g/dL (ref 31.5–35.7)
MCV: 98 fL — ABNORMAL HIGH (ref 79–97)
Monocytes Absolute: 0.7 10*3/uL (ref 0.1–0.9)
Monocytes: 6 %
Neutrophils Absolute: 8.5 10*3/uL — ABNORMAL HIGH (ref 1.4–7.0)
Neutrophils: 74 %
Platelets: 350 10*3/uL (ref 150–450)
RBC: 3.69 x10E6/uL — ABNORMAL LOW (ref 3.77–5.28)
RDW: 14.3 % (ref 11.7–15.4)
WBC: 11.3 10*3/uL — ABNORMAL HIGH (ref 3.4–10.8)

## 2019-08-09 LAB — LIPID PANEL
Chol/HDL Ratio: 4.1 ratio (ref 0.0–4.4)
Cholesterol, Total: 162 mg/dL (ref 100–199)
HDL: 40 mg/dL (ref >39)
LDL Chol Calc (NIH): 97 mg/dL (ref 0–99)
Triglycerides: 139 mg/dL (ref 0–149)
VLDL Cholesterol Cal: 25 mg/dL (ref 5–40)

## 2019-08-09 LAB — VITAMIN D 25 HYDROXY (VIT D DEFICIENCY, FRACTURES): Vit D, 25-Hydroxy: 54.9 ng/mL (ref 30.0–100.0)

## 2019-08-12 ENCOUNTER — Other Ambulatory Visit: Payer: Self-pay

## 2019-08-12 ENCOUNTER — Ambulatory Visit: Payer: Medicare Other | Admitting: Cardiovascular Disease

## 2019-08-12 VITALS — BP 105/65 | HR 95 | Temp 98.1°F | Ht 61.0 in | Wt 116.6 lb

## 2019-08-12 DIAGNOSIS — E785 Hyperlipidemia, unspecified: Secondary | ICD-10-CM

## 2019-08-12 DIAGNOSIS — I739 Peripheral vascular disease, unspecified: Secondary | ICD-10-CM | POA: Diagnosis not present

## 2019-08-12 DIAGNOSIS — I5022 Chronic systolic (congestive) heart failure: Secondary | ICD-10-CM

## 2019-08-12 DIAGNOSIS — Z720 Tobacco use: Secondary | ICD-10-CM

## 2019-08-12 DIAGNOSIS — I771 Stricture of artery: Secondary | ICD-10-CM | POA: Diagnosis not present

## 2019-08-12 NOTE — Patient Instructions (Addendum)
Medication Instructions:  Your physician recommends that you continue on your current medications as directed. Please refer to the Current Medication list given to you today.  If you need a refill on your cardiac medications before your next appointment, please call your pharmacy.   Lab work: NONE  Testing/Procedures: NONE  Follow-Up: At [facility], you and your health needs are our priority.  As part of our continuing mission to provide you with exceptional heart care, we have created designated Provider Care Teams.  These Care Teams include your primary Cardiologist (physician) and Advanced Practice Providers (APPs -  Physician Assistants and Nurse Practitioners) who all work together to provide you with the care you need, when you need it. You may see Dr Fletcher Anon or one of the following Advanced Practice Providers on your designated Care Team:    Kerin Ransom, PA-C  [place], [place]  Coletta Memos, [place]   Your physician wants you to follow-up in: 4 months.

## 2019-08-12 NOTE — Progress Notes (Signed)
Cardiology Office Note   Date:  08/12/2019   ID:  Hannah French, DOB October 01, 1940, MRN [id number]  PCP:  Dettinger, Fransisca Kaufmann, MD  Cardiologist:  Dr. Burt Knack  No chief complaint on file.     History of Present Illness: Hannah French is a 78 y.o. female who is here today for follow-up visit regarding peripheral arterial disease.  The patient has known history of peripheral arterial disease with asymptomatic severe bilateral iliac stenosis and asymptomatic left subclavian artery stenosis. She has prolonged history of tobacco use and diabetes.  Other medical problems include chronic systolic heart failure, COPD, bladder cancer, hyperlipidemia, pancreatitis and pseudogout. She was most recently seen by me in 2018 and at that time she had no claudication and thus no intervention was performed.  She did have skin lesions at that time that were determined to be due to vasculitis.   Most recent vascular studies in August of this year showed significantly elevated velocities in bilateral common iliac arteries with normal ABI. Carotid Doppler in August of this year showed moderate bilateral carotid stenosis with atypical flow in the left vertebral artery with possible right subclavian artery stenosis.  Right vertebral artery had antegrade flow.  She recently started complaining of right calf discomfort with walking a short distance.  This started about 3 months ago.  There was report of dizziness but she denies that now.  She denies arm claudication.  No lower extremity ulceration.  Past Medical History:  Diagnosis Date  . Abdominal bruit   . Bladder cancer ([place])   . CAD (coronary artery disease)    a. nonobstructive by cath 08/2018.  . Carotid bruit   . Chronic combined systolic and diastolic CHF (congestive heart failure) ([place])   . CKD (chronic kidney disease), stage II   . Diabetes mellitus    Type II  . Dyslipidemia   . Heart murmur   . Mild pulmonary hypertension ([place])    . Mitral valve prolapse    a. not seen on most recent echoes. Mild MR now.  Marland Kitchen NICM (nonischemic cardiomyopathy) ([place])   . Osteoporosis   . Pancreatitis, acute   . Pseudogout   . PVD (peripheral vascular disease) ([place])    a. 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  . Sinus tachycardia   . Tobacco abuse     Past Surgical History:  Procedure Laterality Date  . APPENDECTOMY    . Hysterectomy-type unspecified    . LAPAROSCOPIC CHOLECYSTECTOMY  2009  . RIGHT/LEFT HEART CATH AND CORONARY ANGIOGRAPHY N/A 09/16/2018   Procedure: RIGHT/LEFT HEART CATH AND CORONARY ANGIOGRAPHY;  Surgeon: Sherren Mocha, MD;  Location: [facility];  Service: Cardiovascular;  Laterality: N/A;     Current Outpatient Medications  Medication Sig Dispense Refill  . blood glucose meter kit and supplies Use up to four times daily as directed 1 each 0  . Cholecalciferol (VITAMIN D) 50 MCG (2000 UT) tablet Take 6,000 Units by mouth daily.    . digoxin (LANOXIN) 0.125 MG tablet Take 0.5 tablets (0.0625 mg total) by mouth daily. 45 tablet 3  . empagliflozin (JARDIANCE) 10 MG TABS tablet Take 10 mg by mouth daily before breakfast. 30 tablet 6  . Fluticasone-Salmeterol (ADVAIR) 100-50 MCG/DOSE AEPB Inhale 1 puff into the lungs 2 (two) times daily. 1 each 3  . furosemide (LASIX) 20 MG tablet Take 2 tablets (40 mg total) by mouth daily. You may take an extra dose for  increase weight gain or swelling 180 tablet 3  . glimepiride (AMARYL) 2 MG tablet Take 1-2 tablets (2-4 mg total) by mouth daily with breakfast. 180 tablet 3  . glucose blood (ONETOUCH VERIO) test strip USE TO CHECK BLOOD GLUCOSE TWICE DAILY AND AS NEEDED 200 each 3  . ibuprofen (ADVIL,MOTRIN) 200 MG tablet Take 400 mg by mouth 2 (two) times daily as needed (for pain).     . metoprolol succinate (TOPROL-XL) 25 MG 24 hr tablet Take 25 mg by mouth daily.    Glory Rosebush DELICA LANCETS 35W MISC USE   TO CHECK  GLUCOSE TWICE DAILY AND AS NEEDED 100 each 2  . sertraline (ZOLOFT) 50 MG tablet Take 25 mg by mouth daily.     No current facility-administered medications for this visit.     Allergies:   Contrast media [iodinated diagnostic agents], Iohexol, Metformin, Pravastatin, Statins, Albuterol, Bextra [valdecoxib], Cefdinir, Cozaar [losartan potassium], Nsaids, Zetia [ezetimibe], Rofecoxib, and Sulfa antibiotics    Social History:  The patient  reports that she has been smoking cigarettes. She has a 50.00 pack-year smoking history. She has never used smokeless tobacco. She reports that she does not drink alcohol or use drugs.   Family History:  [name] family history includes Heart attack in her brother and maternal uncle; Heart failure in her mother; Hypertension in her brother and sister.    ROS:  Please see the history of present illness.   Otherwise, review of systems are positive for none.   All other systems are reviewed and negative.    PHYSICAL EXAM: VS:  BP 105/65   Pulse 95   Temp 98.1 F (36.7 C)   Ht _0  (1.549 m)   Wt 116 lb 9.6 oz (52.9 kg)   SpO2 96%   BMI 22.03 kg/m  , BMI Body mass index is 22.03 kg/m. GEN: Well nourished, well developed, in no acute distress  HEENT: normal  Neck: no JVD, or masses. Bilateral carotid bruits Cardiac: RRR; no rubs, or gallops,no edema . 3/6 systolic ejection murmur in the aortic area Respiratory:  clear to auscultation bilaterally, normal work of breathing GI: soft, nontender, nondistended, + BS MS: no deformity or atrophy  Skin: warm and dry, no rash Neuro:  Strength and sensation are intact Psych: euthymic mood, full affect Vascular: Femoral pulses is +1 bilaterally.  Distal pulses are not palpable.    EKG:  EKG is not ordered today.    Recent Labs: 08/13/2018: Magnesium 1.7 10/04/2018: NT-Pro BNP 4,124 06/09/2019: BNP 253.2 08/08/2019: ALT 8; BUN 19; Creatinine, Ser 1.22; Hemoglobin 12.3; Platelets 350; Potassium 4.5;  Sodium 139    Lipid Panel    Component Value Date/Time   CHOL 162 08/08/2019 1332   TRIG 139 08/08/2019 1332   TRIG 82 06/09/2014 1028   HDL 40 08/08/2019 1332   HDL 57 06/09/2014 1028   CHOLHDL 4.1 08/08/2019 1332   CHOLHDL 3.5 02/06/2013 0932   VLDL 20 02/06/2013 0932   LDLCALC 97 08/08/2019 1332   LDLCALC 131 (H) 06/09/2014 1028      Wt Readings from Last 3 Encounters:  08/12/19 116 lb 9.6 oz (52.9 kg)  08/08/19 116 lb 6.4 oz (52.8 kg)  07/16/19 120 lb (54.4 kg)      Other studies Reviewed: Additional studies/ records that were reviewed today include: ABI. Review of the above records demonstrates: Normal bilaterally with stable iliac disease  No flowsheet data found.    ASSESSMENT AND PLAN:  1.  Peripheral arterial disease: The patient is known to have severe bilateral iliac stenosis for many years but has been mostly asymptomatic.  She did develop right calf claudication about 3 months ago which has been gradually worsening.  I suspect that she became symptomatic with claudication related to recent development of systolic heart failure likely with reduced cardiac output.   She seems to be limited by her symptoms and I have suggested proceeding with angiography and possible endovascular intervention in order to improve her symptoms and functional capacity.  However, the patient wants to wait few months until the pandemic situation improves.  I will bring her back in 4 months to see me but I instructed her to notify me if her symptoms worsen before then.  2.  subclavian artery stenosis: I do not think this is critical.  She has a palpable radial pulse bilaterally and she denies arm claudication or symptoms of steal syndrome.  3.  Chronic systolic heart failure: Followed by Dr. Haroldine Laws and she reports significant improvement in symptoms with the addition of digoxin and Jardiance.  4.  Hyperlipidemia: Intolerance to statins and Zetia.  5.  Tobacco use: Unfortunately, she  is not able to quit.  Disposition:   FU with me in 4 months  Signed,  Kathlyn Sacramento, MD  08/12/2019 10:39 AM    [place]

## 2019-08-25 ENCOUNTER — Ambulatory Visit (INDEPENDENT_AMBULATORY_CARE_PROVIDER_SITE_OTHER): Payer: Medicare Other | Admitting: Licensed Clinical Social Worker

## 2019-08-25 DIAGNOSIS — J441 Chronic obstructive pulmonary disease with (acute) exacerbation: Secondary | ICD-10-CM

## 2019-08-25 DIAGNOSIS — E559 Vitamin D deficiency, unspecified: Secondary | ICD-10-CM

## 2019-08-25 DIAGNOSIS — I739 Peripheral vascular disease, unspecified: Secondary | ICD-10-CM

## 2019-08-25 DIAGNOSIS — F339 Major depressive disorder, recurrent, unspecified: Secondary | ICD-10-CM

## 2019-08-25 DIAGNOSIS — J439 Emphysema, unspecified: Secondary | ICD-10-CM | POA: Diagnosis not present

## 2019-08-25 DIAGNOSIS — E1169 Type 2 diabetes mellitus with other specified complication: Secondary | ICD-10-CM | POA: Diagnosis not present

## 2019-08-25 DIAGNOSIS — Z720 Tobacco use: Secondary | ICD-10-CM

## 2019-08-25 NOTE — Patient Instructions (Signed)
Licensed Clinical Social Worker Visit Information  Goals we discussed today:  Goals Addressed            This Visit's Progress   . Client will talk with LCSW in next 30 days to discuss depression symptoms of client and to discuss stategies for managing depresion symptoms (pt-stated)       Current Barriers:  . Hearing challenges . Breathing challenges  Clinical Social Work Clinical Goal(s):  . Client will talk with LCSW in next 30 days to discuss depression symptoms of client and management of client's depression symptoms  Interventions: . Talked with client about CCM program services . Talked with client about her medication procurement . Talked with client about social support network (daughter and son) . Talked with about pain issues faced . Talked with client about her management of depression symptoms (client experienced the death of a family member within the past 6 weeks)  Patient Self Care Activities:  . Takes medications as prescribed . Communicates regularly with family members for support  Plan:   Client to attend scheduled medical appointments Client or daughter to contact RNCM as needed to discuss nursing needs of client LCSW to call client in next 3 weeks to talk with client about depression symptoms of client and about her management of depression symptoms  Initial goal documentation           Materials Provided: No  Follow Up Plan:  LCSW to call client in next 3 weeks to talk with client about depression symptoms of client and about her management of depression symptoms  The patient verbalized understanding of instructions provided today and declined a print copy of patient instruction materials.   Norva Riffle.Bonham Zingale MSW, LCSW Licensed Clinical Social Worker [facility] [phone]

## 2019-08-25 NOTE — Chronic Care Management (AMB) (Signed)
  Care Management Note   Hannah French Hannah French is a 78 y.o. year old female who is a primary care patient of Dettinger, Fransisca Kaufmann, MD. The CM team was consulted for assistance with chronic disease management and care coordination.   I reached out to [name] by phone today.   Review of patient status, including review of consultants reports, relevant laboratory and other test results, and collaboration with appropriate care team members and the patient's provider was performed as part of comprehensive patient evaluation and provision of chronic care management services.   Social Determinants of Health: risk for depression; risk for stress  Medications    blood glucose meter kit and supplies    Cholecalciferol (VITAMIN D) 50 MCG (2000 UT) tablet    digoxin (LANOXIN) 0.125 MG tablet    empagliflozin (JARDIANCE) 10 MG TABS tablet    Fluticasone-Salmeterol (ADVAIR) 100-50 MCG/DOSE AEPB    furosemide (LASIX) 20 MG tablet    glimepiride (AMARYL) 2 MG tablet    glucose blood (ONETOUCH VERIO) test strip    ibuprofen (ADVIL,MOTRIN) 200 MG tablet    metoprolol succinate (TOPROL-XL) 25 MG 24 hr tablet    ONETOUCH DELICA LANCETS 61A MISC    sertraline (ZOLOFT) 50 MG tablet     Goals Addressed            This Visit's Progress   . Client will talk with LCSW in next 30 days to discuss depression symptoms of client and to discuss stategies for managing depresion symptoms (pt-stated)       Current Barriers:  . Hearing challenges . Breathing challenges  Clinical Social Work Clinical Goal(s):  . Client will talk with LCSW in next 30 days to discuss depression symptoms of client and management of client's depression symptoms  Interventions: . Talked with client about CCM program services . Talked with client about her medication procurement . Talked with client about social support network (daughter and son) . Talked with about pain issues faced . Talked with client about her  management of depression symptoms (client experienced the death of a family member within the past 6 weeks)  Patient Self Care Activities:  . Takes medications as prescribed . Communicates regularly with family members for support  Plan:   Client to attend scheduled medical appointments Client or daughter to contact RNCM as needed to discuss nursing needs of client LCSW to call client in next 3 weeks to talk with client about depression symptoms of client and about her management of depression symptoms  Initial goal documentation          Follow Up Plan: LCSW to call client in next 3 weeks to talk with client about depression symptoms of client and about her management of depression symptoms  Norva Riffle.Denim Kalmbach MSW, LCSW Licensed Clinical Social Worker [facility] [phone]

## 2019-08-28 ENCOUNTER — Telehealth: Payer: Self-pay | Admitting: Family Medicine

## 2019-08-28 NOTE — Telephone Encounter (Signed)
Gave sample of Basaglar that we have on the fridge

## 2019-08-28 NOTE — Telephone Encounter (Signed)
Patient aware and will pick up samples tomorrow.

## 2019-08-28 NOTE — Telephone Encounter (Signed)
Patient is scheduled to have her knees injected next week.  She said when she has done this in the past, you prescribe Hannah French for her or give her a sample to use until she is able to get her blood sugar back under control since the prednisone elevates her blood sugar.  Please advise.

## 2019-08-28 NOTE — Telephone Encounter (Signed)
Left message to call back  

## 2019-09-03 DIAGNOSIS — M25562 Pain in left knee: Secondary | ICD-10-CM | POA: Diagnosis not present

## 2019-09-03 DIAGNOSIS — M170 Bilateral primary osteoarthritis of knee: Secondary | ICD-10-CM | POA: Diagnosis not present

## 2019-09-03 DIAGNOSIS — M25561 Pain in right knee: Secondary | ICD-10-CM | POA: Diagnosis not present

## 2019-09-08 ENCOUNTER — Other Ambulatory Visit: Payer: Self-pay | Admitting: Cardiovascular Disease

## 2019-09-08 ENCOUNTER — Other Ambulatory Visit: Payer: Self-pay | Admitting: Family Medicine

## 2019-09-10 ENCOUNTER — Other Ambulatory Visit (HOSPITAL_COMMUNITY): Payer: Self-pay

## 2019-09-10 MED ORDER — METOPROLOL SUCCINATE ER 25 MG PO TB24: 25.0000 mg | ORAL_TABLET | Freq: Every day | ORAL | 3 refills | Status: DC

## 2019-09-15 ENCOUNTER — Ambulatory Visit (INDEPENDENT_AMBULATORY_CARE_PROVIDER_SITE_OTHER): Payer: Medicare Other | Admitting: Licensed Clinical Social Worker

## 2019-09-15 DIAGNOSIS — J441 Chronic obstructive pulmonary disease with (acute) exacerbation: Secondary | ICD-10-CM

## 2019-09-15 DIAGNOSIS — I5022 Chronic systolic (congestive) heart failure: Secondary | ICD-10-CM | POA: Diagnosis not present

## 2019-09-15 DIAGNOSIS — F339 Major depressive disorder, recurrent, unspecified: Secondary | ICD-10-CM

## 2019-09-15 DIAGNOSIS — E1169 Type 2 diabetes mellitus with other specified complication: Secondary | ICD-10-CM | POA: Diagnosis not present

## 2019-09-15 DIAGNOSIS — I2510 Atherosclerotic heart disease of native coronary artery without angina pectoris: Secondary | ICD-10-CM

## 2019-09-15 NOTE — Chronic Care Management (AMB) (Signed)
  Care Management Note   Hannah French is a 78 y.o. year old female who is a primary care patient of Dettinger, Fransisca Kaufmann, MD. The CM team was consulted for assistance with chronic disease management and care coordination.   I reached out to [place], Hannah French, by phone today.   Review of patient status, including review of consultants reports, relevant laboratory and other test results, and collaboration with appropriate care team members and the patient's provider was performed as part of comprehensive patient evaluation and provision of chronic care management services.   Social determinants of health: risk of tobacco use; risk of depression  Medications   (very important)  New medications from outside sources are available for reconciliation  blood glucose meter kit and supplies Cholecalciferol (VITAMIN D) 50 MCG (2000 UT) tablet digoxin (LANOXIN) 0.125 MG tablet empagliflozin (JARDIANCE) 10 MG TABS tablet Fluticasone-Salmeterol (ADVAIR) 100-50 MCG/DOSE AEPB furosemide (LASIX) 20 MG tablet glimepiride (AMARYL) 2 MG tablet glucose blood (ONETOUCH VERIO) test strip ibuprofen (ADVIL,MOTRIN) 200 MG tablet metoprolol succinate (TOPROL-XL) 25 MG 24 hr tablet ONETOUCH DELICA LANCETS 03K MISC sertraline (ZOLOFT) 50 MG tablet   Goals Addressed            This Visit's Progress   . Client will talk with LCSW in next 30 days to discuss depression symptoms of client and to discuss stategies for managing depresion symptoms (pt-stated)       Current Barriers:  . Hearing challenges . Breathing challenges . Mental Health Challenges in client with Chronic Diagnoses of CHF, depression, recurrent, Type 2 DM, COPD, and CAD  Clinical Social Work Clinical Goal(s):  . Client will talk with LCSW in next 30 days to discuss depression symptoms of client and management of client's depression symptoms  Interventions: . Previously talked with client about CCM  program services . Talked with client previously about her medication procurement . Previously talked with client about social support network (Hannah French and son) . Talked previously with client about pain issues faced . Previously talked with client about her management of depression symptoms (client experienced the death of a family member within the past 2 months)  Patient Self Care Activities:  . Takes medications as prescribed . Communicates regularly with family members for support  Plan:   Client to attend scheduled medical appointments Client or Hannah French to contact RNCM as needed to discuss nursing needs of client LCSW to call client in next 4 weeks to talk with client about depression symptoms of client and about her management of depression symptoms  Initial goal documentation         Follow Up Plan: LCSW to call client/Hannah French, Hannah French, in next 4 weeks to talk with client or Hannah French about depression symptoms of client and about her management of depression symptoms  Norva Riffle.Kailea Dannemiller MSW, LCSW Licensed Clinical Social Worker [facility] [phone]

## 2019-09-15 NOTE — Patient Instructions (Addendum)
Licensed Clinical Social Worker Visit Information  Goals we discussed today:  Goals Addressed            This Visit's Progress   . Client will talk with LCSW in next 30 days to discuss depression symptoms of client and to discuss stategies for managing depresion symptoms (pt-stated)       Current Barriers:  . Hearing challenges . Breathing challenges . Mental Health Challenges in client with Chronic Diagnoses of CHF, depression, recurrent, Type 2 DM, COPD, and CAD  Clinical Social Work Clinical Goal(s):  . Client will talk with LCSW in next 30 days to discuss depression symptoms of client and management of client's depression symptoms  Interventions: Previously talked with client about CCM program services  Talked with client previously about her medication procurement  Previously talked with client about social support network (daughter and son)  Talked previously with client about pain issues faced  Previously talked with client about her management of depression symptoms (client experienced the death of a family member within the past 2 months)  Patient Self Care Activities:  . Takes medications as prescribed . Communicates regularly with family members for support  Plan:   Client to attend scheduled medical appointments Client or daughter to contact RNCM as needed to discuss nursing needs of client LCSW to call client in next 4 weeks to talk with client about depression symptoms of client and about her management of depression symptoms  Initial goal documentation            Materials Provided: No  Follow Up Plan:  LCSW to call client in next 4 weeks to talk with client about depression symptoms of client and about her management of depression symptoms  The patient verbalized understanding of instructions provided today and declined a print copy of patient instruction materials.   Norva Riffle.Zohan Shiflet MSW, LCSW Licensed Clinical Social Worker [facility] [phone]

## 2019-10-15 ENCOUNTER — Ambulatory Visit (INDEPENDENT_AMBULATORY_CARE_PROVIDER_SITE_OTHER): Payer: Medicare Other | Admitting: Licensed Clinical Social Worker

## 2019-10-15 DIAGNOSIS — J441 Chronic obstructive pulmonary disease with (acute) exacerbation: Secondary | ICD-10-CM | POA: Diagnosis not present

## 2019-10-15 DIAGNOSIS — I2510 Atherosclerotic heart disease of native coronary artery without angina pectoris: Secondary | ICD-10-CM | POA: Diagnosis not present

## 2019-10-15 DIAGNOSIS — F339 Major depressive disorder, recurrent, unspecified: Secondary | ICD-10-CM

## 2019-10-15 DIAGNOSIS — I5022 Chronic systolic (congestive) heart failure: Secondary | ICD-10-CM | POA: Diagnosis not present

## 2019-10-15 DIAGNOSIS — E1169 Type 2 diabetes mellitus with other specified complication: Secondary | ICD-10-CM

## 2019-10-15 NOTE — Chronic Care Management (AMB) (Signed)
  Care Management Note   Hannah French is a 79 y.o. year old female who is a primary care patient of Dettinger, Fransisca Kaufmann, MD. The CM team was consulted for assistance with chronic disease management and care coordination.   I reached out to [name] by phone today.     Review of patient status, including review of consultants reports, relevant laboratory and other test results, and collaboration with appropriate care team members and the patient's provider was performed as part of comprehensive patient evaluation and provision of chronic care management services.   Social determinants of health: risk of tobacco use; risk of depression  Medications   (very important)  New medications from outside sources are available for reconciliation  blood glucose meter kit and supplies Cholecalciferol (VITAMIN D) 50 MCG (2000 UT) tablet digoxin (LANOXIN) 0.125 MG tablet empagliflozin (JARDIANCE) 10 MG TABS tablet Fluticasone-Salmeterol (ADVAIR) 100-50 MCG/DOSE AEPB furosemide (LASIX) 20 MG tablet glimepiride (AMARYL) 2 MG tablet glucose blood (ONETOUCH VERIO) test strip ibuprofen (ADVIL,MOTRIN) 200 MG tablet metoprolol succinate (TOPROL-XL) 25 MG 24 hr tablet ONETOUCH DELICA LANCETS 64P MISC sertraline (ZOLOFT) 50 MG tablet   Goals    . Client will talk with LCSW in next 30 days to discuss depression symptoms of client and to discuss stategies for managing depresion symptoms (pt-stated)     Current Barriers:  . Hearing challenges . Breathing challenges . Mental Health Challenges in client with Chronic Diagnoses of CHF, depression, recurrent, Type 2 DM, COPD, and CAD  Clinical Social Work Clinical Goal(s):  . Client will talk with LCSW in next 30 days to discuss depression symptoms of client and management of client's depression symptoms  Interventions: . Talked with client about CCM program services . Talked with client about her medication procurement . Talked  with client about social support network (daughter and son) . Talked with about pain issues faced . Talked with client about her management of depression symptoms (client experienced the death of a family member within the past 6 weeks) . Provided counseling support for client . Encouraged client to communicate with RNCM to discuss nursing needs of client  Patient Self Care Activities:  . Takes medications as prescribed . Communicates regularly with family members for support  Plan:   Client to attend scheduled medical appointments Client or daughter to contact RNCM as needed to discuss nursing needs of client LCSW to call client in next 4 weeks to talk with client about depression symptoms of client and about her management of depression symptoms  Initial goal documentation          Follow Up Plan: LCSW to call client in next 4 weeks to talk with client about depression symptoms of client and client management of depression symptoms  Norva Riffle.Kashawn Manzano MSW, LCSW Licensed Clinical Social Worker [facility] [phone]

## 2019-10-15 NOTE — Patient Instructions (Addendum)
Licensed Clinical Social Worker Visit Information  Goals we discussed today:  Goals    . Client will talk with LCSW in next 30 days to discuss depression symptoms of client and to discuss stategies for managing depresion symptoms (pt-stated)     Current Barriers:  . Hearing challenges . Breathing challenges . Mental Health Challenges in client with Chronic Diagnoses of CHF, depression, recurrent, Type 2 DM, COPD, and CAD  Clinical Social Work Clinical Goal(s):  . Client will talk with LCSW in next 30 days to discuss depression symptoms of client and management of client's depression symptoms  Interventions: Talked with client about CCM program services  Talked with client about her medication procurement  Talked with client about social support network (daughter and son)  Talked with about pain issues faced  Talked with client about her management of depression symptoms (client experienced the death of a family member within the past 6 weeks)  Provided counseling support for client  Encouraged client to communicate with RNCM to discuss nursing needs of client  Patient Self Care Activities:  . Takes medications as prescribed . Communicates regularly with family members for support  Plan:   Client to attend scheduled medical appointments Client or daughter to contact RNCM as needed to discuss nursing needs of client LCSW to call client in next 4 weeks to talk with client about depression symptoms of client and about her management of depression symptoms  Initial goal documentation            Materials Provided: No  Follow Up Plan: LCSW to call client in next 4 weeks to talk with client about depression symptoms of client and about her management of depression symptoms  The patient verbalized understanding of instructions provided today and declined a print copy of patient instruction materials.   Norva Riffle.Shalane Florendo MSW, LCSW Licensed Clinical Social Worker [facility] [phone]

## 2019-11-12 ENCOUNTER — Ambulatory Visit (INDEPENDENT_AMBULATORY_CARE_PROVIDER_SITE_OTHER): Payer: Medicare Other | Admitting: Licensed Clinical Social Worker

## 2019-11-12 DIAGNOSIS — I2510 Atherosclerotic heart disease of native coronary artery without angina pectoris: Secondary | ICD-10-CM | POA: Diagnosis not present

## 2019-11-12 DIAGNOSIS — I5022 Chronic systolic (congestive) heart failure: Secondary | ICD-10-CM | POA: Diagnosis not present

## 2019-11-12 DIAGNOSIS — E1169 Type 2 diabetes mellitus with other specified complication: Secondary | ICD-10-CM

## 2019-11-12 DIAGNOSIS — F339 Major depressive disorder, recurrent, unspecified: Secondary | ICD-10-CM

## 2019-11-12 DIAGNOSIS — J441 Chronic obstructive pulmonary disease with (acute) exacerbation: Secondary | ICD-10-CM

## 2019-11-12 NOTE — Patient Instructions (Addendum)
Licensed Clinical Social Worker Visit Information  Goals we discussed today:  Goals    . Client will talk with LCSW in next 30 days to discuss depression symptoms of client and to discuss stategies for managing depresion symptoms (pt-stated)     Current Barriers:  . Hearing challenges . Breathing challenges . Mental Health Challenges in client with Chronic Diagnoses of CHF, depression, recurrent, Type 2 DM, COPD, and CAD  Clinical Social Work Clinical Goal(s):  . Client will talk with LCSW in next 30 days to discuss depression symptoms of client and management of client's depression symptoms  Interventions: . Talked with client about CCM program services . Talked with client about her medication procurement . Talked with client about social support network (daughter and son) . Talked with about pain issues faced . Talked with client about her management of depression symptoms (client experienced the death of a family member within the past 6 weeks)  Provided counseling support for client  Encouraged client to communicate with RNCM to discuss nursing needs of client  Talked with client about her upcoming medical appointments  Patient Self Care Activities:  . Takes medications as prescribed . Communicates regularly with family members for support  Plan:   Client to attend scheduled medical appointments Client or daughter to contact RNCM as needed to discuss nursing needs of client LCSW to call client in next 4 weeks to talk with client about depression symptoms of client and about her management of depression symptoms  Initial goal documentation          Materials Provided: No  Follow Up Plan: LCSW to call client in next 4 weeks to talk with client about depression  symptoms of client and about her management of depression symptoms  The patient verbalized understanding of instructions provided today and declined a print copy of patient instruction materials.   Norva Riffle.Kerrie Timm MSW, LCSW Licensed Clinical Social Worker [facility] [phone]

## 2019-11-12 NOTE — Chronic Care Management (AMB) (Signed)
  Care Management Note   Hannah French Hannah French is a 79 y.o. year old female who is a primary care patient of Dettinger, Fransisca Kaufmann, MD. The CM team was consulted for assistance with chronic disease management and care coordination.   I reached out to [name] by phone today.   Review of patient status, including review of consultants reports, relevant laboratory and other test results, and collaboration with appropriate care team members and the patient's provider was performed as part of comprehensive patient evaluation and provision of chronic care management services.   Social determinants of health; risk of tobacco use; risk of depression  Medications   (very important)  New medications from outside sources are available for reconciliation  blood glucose meter kit and supplies Cholecalciferol (VITAMIN D) 50 MCG (2000 UT) tablet digoxin (LANOXIN) 0.125 MG tablet empagliflozin (JARDIANCE) 10 MG TABS tablet Fluticasone-Salmeterol (ADVAIR) 100-50 MCG/DOSE AEPB furosemide (LASIX) 20 MG tablet glimepiride (AMARYL) 2 MG tablet glucose blood (ONETOUCH VERIO) test strip ibuprofen (ADVIL,MOTRIN) 200 MG tablet metoprolol succinate (TOPROL-XL) 25 MG 24 hr tablet ONETOUCH DELICA LANCETS 80K MISC sertraline (ZOLOFT) 50 MG tablet  Goals    . Client will talk with LCSW in next 30 days to discuss depression symptoms of client and to discuss stategies for managing depresion symptoms (pt-stated)     Current Barriers:  . Hearing challenges . Breathing challenges . Mental Health Challenges in client with Chronic Diagnoses of CHF, depression, recurrent, Type 2 DM, COPD, and CAD  Clinical Social Work Clinical Goal(s):  . Client will talk with LCSW in next 30 days to discuss depression symptoms of client and management of client's depression symptoms  Interventions:  Talked with client about CCM program services  Talked with client about her medication procurement  Talked with  client about social support network (daughter and son)  Talked with about pain issues faced  Talked with client about her management of depression symptoms (client experienced the death of a family member within the past 6 weeks)  Provided counseling support for client  Encouraged client to communicate with RNCM to discuss nursing needs of client  Talked with client about her upcoming medical appointments  Patient Self Care Activities:  . Takes medications as prescribed . Communicates regularly with family members for support  Plan:   Client to attend scheduled medical appointments Client or daughter to contact RNCM as needed to discuss nursing needs of client LCSW to call client in next 4 weeks to talk with client about depression symptoms of client and about her management of depression symptoms  Initial goal documentation         Follow Up Plan: LCSW to call client in next 4 weeks to talk with client about  depression symptoms of client and about client management of depression symptoms  Norva Riffle.Hyden Soley MSW, LCSW Licensed Clinical Social Worker [facility] [phone]

## 2019-11-18 ENCOUNTER — Telehealth (HOSPITAL_COMMUNITY): Payer: Self-pay

## 2019-11-18 NOTE — Telephone Encounter (Signed)

## 2019-11-19 ENCOUNTER — Encounter (HOSPITAL_COMMUNITY): Payer: Self-pay | Admitting: Internal Medicine

## 2019-11-19 ENCOUNTER — Ambulatory Visit (HOSPITAL_COMMUNITY)
Admission: RE | Admit: 2019-11-19 | Discharge: 2019-11-19 | Disposition: A | Discharge status: Home / Self Care | Payer: Medicare Other | Source: Ambulatory Visit | Attending: Internal Medicine | Admitting: Internal Medicine

## 2019-11-19 ENCOUNTER — Other Ambulatory Visit: Payer: Self-pay

## 2019-11-19 VITALS — BP 104/58 | HR 84 | Wt 112.4 lb

## 2019-11-19 DIAGNOSIS — E875 Hyperkalemia: Secondary | ICD-10-CM | POA: Insufficient documentation

## 2019-11-19 DIAGNOSIS — I739 Peripheral vascular disease, unspecified: Secondary | ICD-10-CM

## 2019-11-19 DIAGNOSIS — N182 Chronic kidney disease, stage 2 (mild): Secondary | ICD-10-CM | POA: Insufficient documentation

## 2019-11-19 DIAGNOSIS — I771 Stricture of artery: Secondary | ICD-10-CM | POA: Diagnosis not present

## 2019-11-19 DIAGNOSIS — E1151 Type 2 diabetes mellitus with diabetic peripheral angiopathy without gangrene: Secondary | ICD-10-CM | POA: Insufficient documentation

## 2019-11-19 DIAGNOSIS — I5022 Chronic systolic (congestive) heart failure: Secondary | ICD-10-CM

## 2019-11-19 DIAGNOSIS — I5042 Chronic combined systolic (congestive) and diastolic (congestive) heart failure: Secondary | ICD-10-CM | POA: Insufficient documentation

## 2019-11-19 DIAGNOSIS — I447 Left bundle-branch block, unspecified: Secondary | ICD-10-CM | POA: Diagnosis not present

## 2019-11-19 DIAGNOSIS — I272 Pulmonary hypertension, unspecified: Secondary | ICD-10-CM | POA: Diagnosis not present

## 2019-11-19 DIAGNOSIS — E1122 Type 2 diabetes mellitus with diabetic chronic kidney disease: Secondary | ICD-10-CM | POA: Insufficient documentation

## 2019-11-19 DIAGNOSIS — I2510 Atherosclerotic heart disease of native coronary artery without angina pectoris: Secondary | ICD-10-CM | POA: Diagnosis not present

## 2019-11-19 DIAGNOSIS — E785 Hyperlipidemia, unspecified: Secondary | ICD-10-CM | POA: Insufficient documentation

## 2019-11-19 DIAGNOSIS — Z720 Tobacco use: Secondary | ICD-10-CM

## 2019-11-19 DIAGNOSIS — Z79899 Other long term (current) drug therapy: Secondary | ICD-10-CM | POA: Diagnosis not present

## 2019-11-19 DIAGNOSIS — Z7984 Long term (current) use of oral hypoglycemic drugs: Secondary | ICD-10-CM | POA: Diagnosis not present

## 2019-11-19 DIAGNOSIS — F17210 Nicotine dependence, cigarettes, uncomplicated: Secondary | ICD-10-CM | POA: Diagnosis not present

## 2019-11-19 DIAGNOSIS — J449 Chronic obstructive pulmonary disease, unspecified: Secondary | ICD-10-CM | POA: Diagnosis not present

## 2019-11-19 LAB — CBC
HCT: 35.9 % — ABNORMAL LOW (ref 36.0–46.0)
Hemoglobin: 11.6 g/dL — ABNORMAL LOW (ref 12.0–15.0)
MCH: 34.2 pg — ABNORMAL HIGH (ref 26.0–34.0)
MCHC: 32.3 g/dL (ref 30.0–36.0)
MCV: 105.9 fL — ABNORMAL HIGH (ref 80.0–100.0)
Platelets: 323 10*3/uL (ref 150–400)
RBC: 3.39 MIL/uL — ABNORMAL LOW (ref 3.87–5.11)
RDW: 15.7 % — ABNORMAL HIGH (ref 11.5–15.5)
WBC: 10.2 10*3/uL (ref 4.0–10.5)
nRBC: 0 % (ref 0.0–0.2)

## 2019-11-19 LAB — BASIC METABOLIC PANEL
Anion gap: 13 (ref 5–15)
BUN: 24 mg/dL — ABNORMAL HIGH (ref 8–23)
CO2: 28 mmol/L (ref 22–32)
Calcium: 9.5 mg/dL (ref 8.9–10.3)
Chloride: 98 mmol/L (ref 98–111)
Creatinine, Ser: 1.21 mg/dL — ABNORMAL HIGH (ref 0.44–1.00)
GFR calc Af Amer: 49 mL/min — ABNORMAL LOW (ref >60)
GFR calc non Af Amer: 43 mL/min — ABNORMAL LOW (ref >60)
Glucose, Bld: 211 mg/dL — ABNORMAL HIGH (ref 70–99)
Potassium: 3.8 mmol/L (ref 3.5–5.1)
Sodium: 139 mmol/L (ref 135–145)

## 2019-11-19 LAB — BRAIN NATRIURETIC PEPTIDE: B Natriuretic Peptide: 153.4 pg/mL — ABNORMAL HIGH (ref 0.0–100.0)

## 2019-11-19 LAB — DIGOXIN LEVEL: Digoxin Level: 0.5 ng/mL — ABNORMAL LOW (ref 0.8–2.0)

## 2019-11-19 NOTE — Addendum Note (Signed)
Encounter addended by: Scarlette Calico, RN on: 11/19/2019 2:27 PM  Actions taken: Order list changed, Diagnosis association updated, Clinical Note Signed, Charge Capture section accepted

## 2019-11-19 NOTE — Patient Instructions (Signed)
Labs done today, your results will be available in MyChart, we will contact you for abnormal readings.  Please call us in July to schedule your 6 month follow up  If you have any questions or concerns before your next appointment please send [place] a message through [place] or call our office at [phone].  At the [facility], you and your health needs are our priority. As part of our continuing mission to provide you with exceptional heart care, we have created designated Provider Care Teams. These Care Teams include your primary Cardiologist (physician) and Advanced Practice Providers (APPs- Physician Assistants and Nurse Practitioners) who all work together to provide you with the care you need, when you need it.   You may see any of the following providers on your designated Care Team at your next follow up: Marland Kitchen Dr Glori Bickers . Dr Loralie Champagne . Darrick Grinder, NP . Lyda Jester, PA . Audry Riles, PharmD   Please be sure to bring in all your medications bottles to every appointment.

## 2019-11-19 NOTE — Progress Notes (Signed)
ADVANCED HF CLINIC NOTE  Date:  11/19/2019   ID:  Hannah French, DOB 1940/11/30, MRN [id number]  PCP:  Dettinger, Fransisca Kaufmann, MD  Cardiologist:  Sherren Mocha, MD  Electrophysiologist:  None   Referring Provider: Melina Copa PA-C  Chief Complaint:  HF  History of Present Illness:   Hannah French is a 79 y.o. 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   She was seen in the office on 08/26/2018 by Dr. Burt Knack and was noted to have shortness of breath felt to be related to COPD and URI. She did have a progression on her EKG from previous IVCD to left bundle branch block. An echo was done which showed a decline in LV function with EF 30-35% with diffuse hypokinesis (prior was normal). With new LBBB and significant change in LV function she was sent for right and left heart cath which was done on 09/16/2018 which showed nonobstructive CAD with diffuse luminal irregularities involving the left main, left circumflex and RCA. There was 40-50% calcific stenosis of the proximal LAD. There was mild pulmonary hypertension and mild elevation in LVEDP with preserved cardiac output. Medical therapy was recommended.. She is not able to take statin drugs or aspirin because of intolerance to both medicines. She has stated that she is a "free bleeder" and cannot tolerate any antiplatelet therapy.   Her medical therapy has been limited by low BP and dizziness (beta blocker reduced, losartan stopped). Corlanor was started for elevated HR and soft BP but not yet approved, F/u echo 04/28/19 showed EF 25-30%, diffuse HK, Grade II DD,  mildly dilated LA, mild MR.   I saw her for the first  time in the HF Clinic in 8/20. Was doing fairly well. Felt SOB mostly due to COPD. Hall walk with sats 93-96%. Unable to titrate HF meds due to low BP and dizziness  Returns for f/u. Says she feels a bit better. Able to do some housework. Major complaint is LE claudication. Dr. Fletcher Anon planning stenting. No edema, orthopnea or PND. Still smoking 3/4 PPD  PFTs 06/11/19 FEV1 0.96L (55%) FVC 1.61L (68%) Ratio60% FEF25-75  0.40 (29%) DLCO 50%   Past Medical History:  Diagnosis Date  . Abdominal bruit   . Bladder cancer ([place])   . CAD (coronary artery disease)    a. nonobstructive by cath 08/2018.  . Carotid bruit   . Chronic combined systolic and diastolic CHF (congestive heart failure) ([place])   . CKD (chronic kidney disease), stage II   . Diabetes mellitus    Type II  . Dyslipidemia   . Heart murmur   . Mild pulmonary hypertension ([place])   . Mitral valve prolapse    a. not seen on most recent echoes. Mild MR now.  Marland Kitchen NICM (nonischemic cardiomyopathy) ([place])   . Osteoporosis   . Pancreatitis, acute   . Pseudogout   . PVD (peripheral vascular disease) ([place])    a. 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  . Sinus tachycardia   . Tobacco abuse     Past Surgical History:  Procedure Laterality Date  . APPENDECTOMY    . Hysterectomy-type unspecified    . LAPAROSCOPIC CHOLECYSTECTOMY  2009  .  RIGHT/LEFT HEART CATH AND CORONARY ANGIOGRAPHY N/A 09/16/2018   Procedure: RIGHT/LEFT HEART CATH AND CORONARY ANGIOGRAPHY;  Surgeon: Sherren Mocha, MD;  Location: [facility];  Service: Cardiovascular;  Laterality: N/A;    Current Medications: Current Meds  Medication Sig  . blood glucose meter kit and supplies Use up to four times daily as directed  . Cholecalciferol (VITAMIN D) 50 MCG (2000 UT) tablet Take 6,000 Units by mouth daily.  . digoxin (LANOXIN) 0.125 MG tablet Take 0.5 tablets (0.0625 mg total) by mouth daily.  . empagliflozin  (JARDIANCE) 10 MG TABS tablet Take 10 mg by mouth daily before breakfast.  . Fluticasone-Salmeterol (ADVAIR) 100-50 MCG/DOSE AEPB Inhale 1 puff into the lungs 2 (two) times daily.  . furosemide (LASIX) 20 MG tablet Take 2 tablets (40 mg total) by mouth daily. You may take an extra dose for increase weight gain or swelling  . glimepiride (AMARYL) 2 MG tablet Take 1-2 tablets (2-4 mg total) by mouth daily with breakfast.  . glucose blood (ONETOUCH VERIO) test strip USE TO CHECK BLOOD GLUCOSE TWICE DAILY AND AS NEEDED  . ibuprofen (ADVIL,MOTRIN) 200 MG tablet Take 400 mg by mouth 2 (two) times daily as needed (for pain).   . metoprolol succinate (TOPROL-XL) 25 MG 24 hr tablet Take 1 tablet (25 mg total) by mouth daily. Take 1 tablet By Mouth daily  . ONETOUCH DELICA LANCETS 16X MISC USE   TO CHECK GLUCOSE TWICE DAILY AND AS NEEDED  . sertraline (ZOLOFT) 50 MG tablet Take 1 tablet (50 mg total) by mouth daily.     Allergies:   Contrast media [iodinated diagnostic agents], Iohexol, Metformin, Pravastatin, Statins, Albuterol, Bextra [valdecoxib], Cefdinir, Cozaar [losartan potassium], Nsaids, Zetia [ezetimibe], Rofecoxib, and Sulfa antibiotics   Social History   Socioeconomic History  . Marital status: Divorced    Spouse name: Not on file  . Number of children: 2  . Years of education: Not on file  . Highest education level: Not on file  Occupational History  . Occupation: Retired  Tobacco Use  . Smoking status: Current Every Day Smoker    Packs/day: 1.00    Years: 50.00    Pack years: 50.00    Types: Cigarettes  . Smokeless tobacco: Never Used  Substance and Sexual Activity  . Alcohol use: No  . Drug use: No  . Sexual activity: Not on file  Other Topics Concern  . Not on file  Social History Narrative  . Not on file   Social Determinants of Health   Financial Resource Strain:   . Difficulty of Paying Living Expenses: Not on file  Food Insecurity:   . Worried About Ship broker in the Last Year: Not on file  . Ran Out of Food in the Last Year: Not on file  Transportation Needs:   . Lack of Transportation (Medical): Not on file  . Lack of Transportation (Non-Medical): Not on file  Physical Activity:   . Days of Exercise per Week: Not on file  . Minutes of Exercise per Session: Not on file  Stress:   . Feeling of Stress : Not on file  Social Connections:   . Frequency of Communication with Friends and Family: Not on file  . Frequency of Social Gatherings with Friends and Family: Not on file  . Attends Religious Services: Not on file  . Active Member of Clubs or Organizations: Not on file  . Attends Archivist Meetings: Not on file  .  Marital Status: Not on file     Family History:  [name] family history includes Heart attack in her brother and maternal uncle; Heart failure in her mother; Hypertension in her brother and sister. There is no history of Coronary artery disease.   EKGs/Labs/Other Studies Reviewed:    Studies reviewed were summarized above.   EKG:  EKG 05/14/19, personally reviewed, demonstrating NSR 94bpm with known LBBB (QRS 160m)  similar to prior  Recent Labs: 06/09/2019: BNP 253.2 08/08/2019: ALT 8; BUN 19; Creatinine, Ser 1.22; Hemoglobin 12.3; Platelets 350; Potassium 4.5; Sodium 139  Recent Lipid Panel    Component Value Date/Time   CHOL 162 08/08/2019 1332   TRIG 139 08/08/2019 1332   TRIG 82 06/09/2014 1028   HDL 40 08/08/2019 1332   HDL 57 06/09/2014 1028   CHOLHDL 4.1 08/08/2019 1332   CHOLHDL 3.5 02/06/2013 0932   VLDL 20 02/06/2013 0932   LDLCALC 97 08/08/2019 1332   LDLCALC 131 (H) 06/09/2014 1028    PHYSICAL EXAM:    VS:  BP (!) 104/58   Pulse 84   Wt 51 kg (112 lb 6 oz)   SpO2 95%   BMI 21.23 kg/m   BMI: Body mass index is 21.23 kg/m.  General:  Elderly weak appearing. Severe HOH No resp difficulty General:  Well appearing. No resp difficulty HEENT: normal Neck: supple. no JVD.  Carotids 2+ bilat; no bruits. No lymphadenopathy or thryomegaly appreciated. Cor: PMI nondisplaced. Regular rate & rhythm. Soft SEM RUSB Lungs: decreased BS throughout  Abdomen: soft, nontender, nondistended. No hepatosplenomegaly. No bruits or masses. Good bowel sounds. Extremities: no cyanosis, clubbing, rash, edema Neuro: alert & orientedx3, cranial nerves grossly intact. moves all 4 extremities w/o difficulty. Affect pleasant    Wt Readings from Last 3 Encounters:  11/19/19 51 kg (112 lb 6 oz)  08/12/19 52.9 kg (116 lb 9.6 oz)  08/08/19 52.8 kg (116 lb 6.4 oz)     ASSESSMENT & PLAN:   1. Chronic systolic HF  - due to NICM (? LBBB but not very wide)  - Echo 12/19 EF 30-35% with diffuse hypokinesis - Echo  8/20 EF 25-30% with Grade II DD - Cath 12/19 nonobstructive CAD - Stable NYHA III-IIIb compounded by severe PAD and end-stage COPD - Volume status looks good on lasix 40 daily  - Intolerant of meds due to low BP and dizziness (She does have a BP differential between arms so should likely be using the right arm to obtain accurate readings) - Continue Toprol 25 daily - Continue digoxin 0.125 daily - Losartan stopped due to low BP and hyperkalemia - K remains high so cannot restart currently. Can consider Veltassa - Corlanor ordered but did not get approved - Continue Jardiance - PFTs quite severe and likely major limiting factor (with PAD) - Not interested in ICD or CRT. (cancelled appt with EP) - Check labs today  2. PAD with left subclavian artery stenosis and severe iliac disease - has f/u with Dr. AFletcher Anon- planning for LE stenting  3. CAD - nonobstructive by prior cath. Refuses statins and ASA - no s/s of ischemia  4. COPD with ongoing tobacco use - still smoking 3/4 ppd, encouraged to quit  - PFTs 9/20 with severe COPD  5. DM2 - continue Jardiance  6. AS murmur - significant AS murmur on exam but no significant AS by echo     Signed, DGlori Bickers MD   11/19/2019 2:12 PM    [facility] [address], [place], [place]  [address] Phone: [phone]; Fax: [phone]

## 2019-11-20 ENCOUNTER — Ambulatory Visit (INDEPENDENT_AMBULATORY_CARE_PROVIDER_SITE_OTHER): Payer: Medicare Other | Admitting: Family Medicine

## 2019-11-20 ENCOUNTER — Encounter: Payer: Self-pay | Admitting: Family Medicine

## 2019-11-20 VITALS — BP 137/74 | HR 98 | Temp 97.8°F | Ht 61.0 in | Wt 111.4 lb

## 2019-11-20 DIAGNOSIS — E785 Hyperlipidemia, unspecified: Secondary | ICD-10-CM | POA: Diagnosis not present

## 2019-11-20 DIAGNOSIS — E1169 Type 2 diabetes mellitus with other specified complication: Secondary | ICD-10-CM | POA: Diagnosis not present

## 2019-11-20 DIAGNOSIS — J449 Chronic obstructive pulmonary disease, unspecified: Secondary | ICD-10-CM

## 2019-11-20 LAB — BAYER DCA HB A1C WAIVED: HB A1C (BAYER DCA - WAIVED): 8.1 % — ABNORMAL HIGH (ref <7.0)

## 2019-11-20 MED ORDER — LANTUS SOLOSTAR 100 UNIT/ML ~~LOC~~ SOPN: 10.0000 [IU] | PEN_INJECTOR | Freq: Every day | SUBCUTANEOUS | 99 refills | Status: DC

## 2019-11-20 MED ORDER — EMPAGLIFLOZIN 10 MG PO TABS: 10.0000 mg | ORAL_TABLET | Freq: Every day | ORAL | 6 refills | Status: DC

## 2019-11-20 MED ORDER — BD PEN NEEDLE MINI U/F 31G X 5 MM MISC: 1.0000 | Freq: Four times a day (QID) | 11 refills | Status: DC

## 2019-11-20 MED ORDER — GLIMEPIRIDE 2 MG PO TABS: 2.0000 mg | ORAL_TABLET | Freq: Every day | ORAL | 3 refills | Status: DC

## 2019-11-20 NOTE — Progress Notes (Signed)
BP 137/74   Pulse 98   Temp 97.8 F (36.6 C) (Temporal)   Ht '5\' 1"'  (1.549 m)   Wt 111 lb 6.4 oz (50.5 kg)   SpO2 95%   BMI 21.05 kg/m    Subjective:   Patient ID: Hannah French, female    DOB: 12-18-1940, 79 y.o.   MRN: [id number]  HPI: Hannah French is a 79 y.o. female presenting on 11/20/2019 for Diabetes (3 month follow up)   HPI Type 2 diabetes mellitus Patient comes in today for recheck of his diabetes. Patient has been currently taking Jardiance and glimepiride, A1c is 8.1 which is up from where it was before.. Patient is not currently on an ACE inhibitor/ARB. Patient has not seen an ophthalmologist this year. Patient denies any issues with their feet.   COPD Patient is coming in for COPD recheck today.  He is currently on Advair.  He has a mild chronic cough but denies any major coughing spells or wheezing spells.  He has 0nighttime symptoms per week and 1daytime symptoms per week currently.   Hyperlipidemia Patient is coming in for recheck of his hyperlipidemia. The patient is currently taking no medication currently. They deny any issues with myalgias or history of liver damage from it. They deny any focal numbness or weakness or chest pain.   Relevant past medical, surgical, family and social history reviewed and updated as indicated. Interim medical history since our last visit reviewed. Allergies and medications reviewed and updated.  Review of Systems  Constitutional: Negative for chills and fever.  HENT: Negative for congestion, ear discharge and ear pain.   Eyes: Negative for redness and visual disturbance.  Respiratory: Positive for cough and wheezing (At baseline for cough and wheezing, no worse). Negative for chest tightness and shortness of breath.   Cardiovascular: Negative for chest pain and leg swelling.  Musculoskeletal: Negative for back pain and gait problem.  Skin: Negative for rash.  Neurological: Negative for light-headedness and  headaches.  Psychiatric/Behavioral: Negative for agitation and behavioral problems.  All other systems reviewed and are negative.   Per HPI unless specifically indicated above   Allergies as of 11/20/2019      Reactions   Contrast Media [iodinated Diagnostic Agents] Shortness Of Breath   Iohexol Other (See Comments)   PASSED OUT DURING THE TEST   Metformin Swelling   Face and hands became swollen   Pravastatin Swelling, Other (See Comments)   Myalgia, also   Statins Other (See Comments), Swelling   They make the patient hurt "all over" They make the patient hurt "all over" Myalgia Myalgia, also   Albuterol Other (See Comments)   "Takes the skin off of the inside of my mouth"   Bextra [valdecoxib] Nausea And Vomiting, Swelling   Shut down my kidneys    Cefdinir Other (See Comments)   Pancreatitis   Cozaar [losartan Potassium] Other (See Comments)   dizziness   Nsaids Other (See Comments)   GI intolerance and pain all over, tolerates ibu GI intolerance and pain all over, tolerates ibu   Zetia [ezetimibe] Swelling   Rofecoxib Other (See Comments)   "VIOXX"- "SHUT DOWN MY KIDNEYS"   Sulfa Antibiotics Nausea And Vomiting, Other (See Comments)   Pancreatitis Pancreatitis      Medication List       Accurate as of November 20, 2019 10:59 AM. If you have any questions, ask your nurse or doctor.        B-D  UF III MINI PEN NEEDLES 31G X 5 MM Misc Generic drug: Insulin Pen Needle 1 each by Does not apply route 4 (four) times daily. Started by: Fransisca Kaufmann Temisha Murley, MD   blood glucose meter kit and supplies Use up to four times daily as directed   digoxin 0.125 MG tablet Commonly known as: LANOXIN Take 0.5 tablets (0.0625 mg total) by mouth daily.   empagliflozin 10 MG Tabs tablet Commonly known as: JARDIANCE Take 10 mg by mouth daily before breakfast.   Fluticasone-Salmeterol 100-50 MCG/DOSE Aepb Commonly known as: ADVAIR Inhale 1 puff into the lungs 2 (two) times  daily.   furosemide 20 MG tablet Commonly known as: LASIX Take 2 tablets (40 mg total) by mouth daily. You may take an extra dose for increase weight gain or swelling   glimepiride 2 MG tablet Commonly known as: AMARYL Take 1-2 tablets (2-4 mg total) by mouth daily with breakfast.   glucose blood test strip Commonly known as: OneTouch Verio USE TO CHECK BLOOD GLUCOSE TWICE DAILY AND AS NEEDED   ibuprofen 200 MG tablet Commonly known as: ADVIL Take 400 mg by mouth 2 (two) times daily as needed (for pain).   Lantus SoloStar 100 UNIT/ML Solostar Pen Generic drug: Insulin Glargine Inject 10 Units into the skin daily. Started by: Worthy Rancher, MD   metoprolol succinate 25 MG 24 hr tablet Commonly known as: TOPROL-XL Take 1 tablet (25 mg total) by mouth daily. Take 1 tablet By Mouth daily   OneTouch Delica Lancets 29U Misc USE   TO CHECK GLUCOSE TWICE DAILY AND AS NEEDED   sertraline 50 MG tablet Commonly known as: ZOLOFT Take 1 tablet (50 mg total) by mouth daily.   Vitamin D 50 MCG (2000 UT) tablet Take 6,000 Units by mouth daily.        Objective:   BP 137/74   Pulse 98   Temp 97.8 F (36.6 C) (Temporal)   Ht '5\' 1"'  (1.549 m)   Wt 111 lb 6.4 oz (50.5 kg)   SpO2 95%   BMI 21.05 kg/m   Wt Readings from Last 3 Encounters:  11/20/19 111 lb 6.4 oz (50.5 kg)  11/19/19 112 lb 6 oz (51 kg)  08/12/19 116 lb 9.6 oz (52.9 kg)    Physical Exam Vitals and nursing note reviewed.  Constitutional:      General: She is not in acute distress.    Appearance: She is well-developed. She is not diaphoretic.  Eyes:     Conjunctiva/sclera: Conjunctivae normal.  Cardiovascular:     Rate and Rhythm: Normal rate and regular rhythm.     Heart sounds: Murmur present.  Pulmonary:     Effort: Pulmonary effort is normal. No respiratory distress.     Breath sounds: Wheezing present.  Skin:    General: Skin is warm and dry.     Findings: No rash.  Neurological:     Mental  Status: She is alert and oriented to person, place, and time.     Coordination: Coordination normal.  Psychiatric:        Behavior: Behavior normal.       Assessment & Plan:   Problem List Items Addressed This Visit      Respiratory   COPD GOLD II / active smoker     Endocrine   Hyperlipidemia associated with type 2 diabetes mellitus ([place])   Relevant Medications   empagliflozin (JARDIANCE) 10 MG TABS tablet   glimepiride (AMARYL) 2 MG tablet  Insulin Glargine (LANTUS SOLOSTAR) 100 UNIT/ML Solostar Pen   Type 2 diabetes mellitus ([facility]) - Primary   Relevant Medications   empagliflozin (JARDIANCE) 10 MG TABS tablet   glimepiride (AMARYL) 2 MG tablet   Insulin Glargine (LANTUS SOLOSTAR) 100 UNIT/ML Solostar Pen   Other Relevant Orders   Bayer DCA Hb A1c Waived (Completed)   Microalbumin / creatinine urine ratio (Completed)      A1c is 8.1 which is has been previously.Will add low-dose Lantus to her regiment.  Continue current COPD regimen Follow up plan: Return in about 3 months (around 02/17/2020), or if symptoms worsen or fail to improve, for Diabetes recheck.  Counseling provided for all of the vaccine components Orders Placed This Encounter  Procedures  . Bayer DCA Hb A1c Waived  . Microalbumin / creatinine urine ratio    Caryl Pina, MD [facility] 11/20/2019, 10:59 AM

## 2019-11-21 LAB — MICROALBUMIN / CREATININE URINE RATIO
Creatinine, Urine: 119.5 mg/dL
Microalb/Creat Ratio: 15 mg/g creat (ref 0–29)
Microalbumin, Urine: 17.8 ug/mL

## 2019-12-02 ENCOUNTER — Telehealth: Payer: Self-pay | Admitting: Family Medicine

## 2019-12-02 NOTE — Telephone Encounter (Signed)
Patient is addiment only wants to speak with you. I did get a little information from her. She read the insert pack from her Lantus stating if you have CHF you should not take this medication. So she stopped 3 days ago and is not going to take it any more and will only discuss this with you.

## 2019-12-02 NOTE — Telephone Encounter (Signed)
I do not know what the package insert says but Lantus is essentially insulin and we are just replacing insulin it is long-acting insulin, I like to see the package insert and read what it says but everything I am looking up and studies show that it actually helps the heart because we are controlling diabetes.

## 2019-12-03 NOTE — Telephone Encounter (Signed)
Patient aware and verbalizes understanding. 

## 2019-12-09 ENCOUNTER — Telehealth: Payer: Self-pay

## 2019-12-12 ENCOUNTER — Ambulatory Visit: Payer: Self-pay | Admitting: Licensed Clinical Social Worker

## 2019-12-12 DIAGNOSIS — I2510 Atherosclerotic heart disease of native coronary artery without angina pectoris: Secondary | ICD-10-CM

## 2019-12-12 DIAGNOSIS — F339 Major depressive disorder, recurrent, unspecified: Secondary | ICD-10-CM

## 2019-12-12 DIAGNOSIS — E1169 Type 2 diabetes mellitus with other specified complication: Secondary | ICD-10-CM

## 2019-12-12 DIAGNOSIS — I5022 Chronic systolic (congestive) heart failure: Secondary | ICD-10-CM

## 2019-12-12 DIAGNOSIS — J449 Chronic obstructive pulmonary disease, unspecified: Secondary | ICD-10-CM

## 2019-12-12 NOTE — Patient Instructions (Addendum)
Licensed Clinical Social Worker Visit Information  Goals we discussed today:  Goals    . Client will talk with LCSW in next 30 days to discuss depression symptoms of client and to discuss stategies for managing depresion symptoms (pt-stated)     Current Barriers:  . Hearing challenges . Breathing challenges . Mental Health Challenges in client with Chronic Diagnoses of CHF, depression, recurrent, Type 2 DM, COPD, and CAD  Clinical Social Work Clinical Goal(s):  . Client will talk with LCSW in next 30 days to discuss depression symptoms of client and management of client's depression symptoms  Interventions:  Previously talked with client about CCM program services  Talked with client previously about social support network (daughter and son)  Talked with client prevously about pain issues faced  Talked with client previously about her management of depression symptoms (client experienced the death of a family member within the past 6 weeks)  Previously encouraged client to communicate with RNCM to discuss nursing needs of client  Previously talked with client about her upcoming medical appointments  Patient Self Care Activities:  . Takes medications as prescribed . Communicates regularly with family members for support  Plan:   Client to attend scheduled medical appointments Client or daughter to contact RNCM as needed to discuss nursing needs of client LCSW to call client in next 4 weeks to talk with client about depression symptoms of client and about her management of depression symptoms  Initial goal documentation        Materials Provided: No  Follow Up Plan: LCSW to call client in next 4 weeks to talk with client about depression symptoms of client and about her management of depression symptoms  The patient verbalized understanding of instructions provided today and declined a print copy of patient instruction materials.   Norva Riffle.Kalep Full MSW, LCSW Licensed  Clinical Social Worker [facility] [phone]

## 2019-12-12 NOTE — Chronic Care Management (AMB) (Signed)
  Care Management Note   Hannah French is a 79 y.o. year old female who is a primary care patient of Dettinger, Fransisca Kaufmann, MD. The CM team was consulted for assistance with chronic disease management and care coordination.   I reached out to [name] by phone today.   Review of patient status, including review of consultants reports, relevant laboratory and other test results, and collaboration with appropriate care team members and the patient's provider was performed as part of comprehensive patient evaluation and provision of chronic care management services.   Social determinants of health: risk of tobacco use; risk of depression  Medications   blood glucose meter kit and supplies Cholecalciferol (VITAMIN D) 50 MCG (2000 UT) tablet digoxin (LANOXIN) 0.125 MG tablet empagliflozin (JARDIANCE) 10 MG TABS tablet Fluticasone-Salmeterol (ADVAIR) 100-50 MCG/DOSE AEPB furosemide (LASIX) 20 MG tablet glimepiride (AMARYL) 2 MG tablet glucose blood (ONETOUCH VERIO) test strip ibuprofen (ADVIL,MOTRIN) 200 MG tablet Insulin Glargine (LANTUS SOLOSTAR) 100 UNIT/ML Solostar Pen Insulin Pen Needle (B-D UF III MINI PEN NEEDLES) 31G X 5 MM MISC metoprolol succinate (TOPROL-XL) 25 MG 24 hr tablet ONETOUCH DELICA LANCETS 56C MISC sertraline (ZOLOFT) 50 MG tablet  Goals    . Client will talk with LCSW in next 30 days to discuss depression symptoms of client and to discuss stategies for managing depresion symptoms (pt-stated)     Current Barriers:  . Hearing challenges . Breathing challenges . Mental Health Challenges in client with Chronic Diagnoses of CHF, depression, recurrent, Type 2 DM, COPD, and CAD  Clinical Social Work Clinical Goal(s):  . Client will talk with LCSW in next 30 days to discuss depression symptoms of client and management of client's depression symptoms  Interventions:  Previously talked with client about CCM program services  Talked with client  previously about social support network (daughter and son)  Talked with client prevously about pain issues faced  Talked with client previously about her management of depression symptoms (client experienced the death of a family member within the past 6 weeks)  Previously encouraged client to communicate with RNCM to discuss nursing needs of client  Previously talked with client about her upcoming medical appointments  Patient Self Care Activities:  . Takes medications as prescribed . Communicates regularly with family members for support  Plan:  Client to attend scheduled medical appointments Client or daughter to contact RNCM as needed to discuss nursing needs of client LCSW to call client in next 4 weeks to talk with client about depression symptoms of client and about her management of depression symptoms  Initial goal documentation        Follow Up Plan: LCSW to call client in next 4 weeks to talk with client about depression symptoms of client and about her management of depression symptoms  Norva Riffle.Garrie Woodin MSW, LCSW Licensed Clinical Social Worker [facility] [phone]

## 2019-12-16 ENCOUNTER — Encounter: Payer: Self-pay | Admitting: Cardiovascular Disease

## 2019-12-16 ENCOUNTER — Other Ambulatory Visit: Payer: Self-pay

## 2019-12-16 ENCOUNTER — Ambulatory Visit: Payer: Medicare Other | Admitting: Cardiovascular Disease

## 2019-12-16 VITALS — BP 128/60 | HR 88 | Temp 97.2°F | Ht 60.0 in | Wt 114.0 lb

## 2019-12-16 DIAGNOSIS — I5022 Chronic systolic (congestive) heart failure: Secondary | ICD-10-CM | POA: Diagnosis not present

## 2019-12-16 DIAGNOSIS — E785 Hyperlipidemia, unspecified: Secondary | ICD-10-CM

## 2019-12-16 DIAGNOSIS — Z720 Tobacco use: Secondary | ICD-10-CM

## 2019-12-16 DIAGNOSIS — I739 Peripheral vascular disease, unspecified: Secondary | ICD-10-CM

## 2019-12-16 DIAGNOSIS — I771 Stricture of artery: Secondary | ICD-10-CM | POA: Diagnosis not present

## 2019-12-16 LAB — CBC
Hematocrit: 34.5 % (ref 34.0–46.6)
Hemoglobin: 12.4 g/dL (ref 11.1–15.9)
MCH: 34.7 pg — ABNORMAL HIGH (ref 26.6–33.0)
MCHC: 35.9 g/dL — ABNORMAL HIGH (ref 31.5–35.7)
MCV: 97 fL (ref 79–97)
Platelets: 312 10*3/uL (ref 150–450)
RBC: 3.57 x10E6/uL — ABNORMAL LOW (ref 3.77–5.28)
RDW: 14.2 % (ref 11.7–15.4)
WBC: 10.2 10*3/uL (ref 3.4–10.8)

## 2019-12-16 LAB — BASIC METABOLIC PANEL
BUN/Creatinine Ratio: 23 (ref 12–28)
BUN: 29 mg/dL — ABNORMAL HIGH (ref 8–27)
CO2: 26 mmol/L (ref 20–29)
Calcium: 9.6 mg/dL (ref 8.7–10.3)
Chloride: 97 mmol/L (ref 96–106)
Creatinine, Ser: 1.26 mg/dL — ABNORMAL HIGH (ref 0.57–1.00)
GFR calc Af Amer: 47 mL/min/{1.73_m2} — ABNORMAL LOW (ref >59)
GFR calc non Af Amer: 41 mL/min/{1.73_m2} — ABNORMAL LOW (ref >59)
Glucose: 155 mg/dL — ABNORMAL HIGH (ref 65–99)
Potassium: 5 mmol/L (ref 3.5–5.2)
Sodium: 138 mmol/L (ref 134–144)

## 2019-12-16 MED ORDER — DIPHENHYDRAMINE HCL 50 MG PO TABS: ORAL_TABLET | ORAL | 0 refills | Status: DC

## 2019-12-16 MED ORDER — PREDNISONE 50 MG PO TABS: ORAL_TABLET | ORAL | 0 refills | Status: DC

## 2019-12-16 NOTE — Patient Instructions (Addendum)
Medication Instructions:  No changes *If you need a refill on your cardiac medications before your next appointment, please call your pharmacy*   Lab Work: Your provider would like for you to have the following labs today: CBC and BMET  If you have labs (blood work) drawn today and your tests are completely normal, you will receive your results only by: Marland Kitchen MyChart Message (if you have MyChart) OR . A paper copy in the mail If you have any lab test that is abnormal or we need to change your treatment, we will call you to review the results.   Testing/Procedures: Your physician has requested that you have a peripheral vascular angiogram. This exam is performed at the hospital. During this exam IV contrast is used to look at arterial blood flow. Please review the information sheet given for details.  Follow-Up: At [facility], you and your health needs are our priority.  As part of our continuing mission to provide you with exceptional heart care, we have created designated Provider Care Teams.  These Care Teams include your primary Cardiologist (physician) and Advanced Practice Providers (APPs -  Physician Assistants and Nurse Practitioners) who all work together to provide you with the care you need, when you need it.  We recommend signing up for the patient portal called "MyChart".  Sign up information is provided on this After Visit Summary.  MyChart is used to connect with patients for Virtual Visits (Telemedicine).  Patients are able to view lab/test results, encounter notes, upcoming appointments, etc.  Non-urgent messages can be sent to your provider as well.   To learn more about what you can do with MyChart, go to [email or web address].    Your next appointment:   1 month(s)  The format for your next appointment:   In Person  Provider:   Kathlyn Sacramento, MD   Other Instructions    [address] [place] [address] Dept: [phone] Loc: [place]  12/16/2019  You are scheduled for a Peripheral Angiogram on Wednesday, April 7 with Dr. Kathlyn Sacramento.  1. Please arrive at the [facility] (Main Entrance A) at [facility]: [address] [place], [place] [address] at 6:30 AM (This time is two hours before your procedure to ensure your preparation). Free valet parking service is available.   Special note: Every effort is made to have your procedure done on time. Please understand that emergencies sometimes delay scheduled procedures.  2. Diet: Do not eat solid foods after midnight.  The patient may have clear liquids until 5am upon the day of the procedure.  3. Labs: You will need to have blood drawn today: CBC and BMET  You will need to have the coronavirus test completed prior to your procedure. An appointment has been made at 12:10 on 12/27/19. This is a Drive Up Visit at the [facility] [address] Someone will direct you to the appropriate testing line. Please tell them that you are there for procedure testing. Stay in your car and someone will be with you shortly. Please make sure to have all other labs completed before this test because you will need to stay quarantined until your procedure.   4. Medication instructions in preparation for your procedure:    Contrast Allergy:  Please take Prednisone 50mg  by mouth at: Thirteen hours prior to cath Seven hours prior to cath. And prior to  leaving home please take last dose of Prednisone 50mg  and Benadryl 50mg  by mouth.  Take half the dose of Lantus the night before the procedure. Hold all other Diabetic medication the morning of the procedure. Hold the Furosemide the morning of the procedure   On the morning of your procedure, take your Aspirin and any morning medicines NOT listed above.  You may use sips of water.  5. Plan for one  night stay--bring personal belongings. 6. Bring a current list of your medications and current insurance cards. 7. You MUST have a responsible person to drive you home. 8. Someone MUST be with you the first 24 hours after you arrive home or your discharge will be delayed. 9. Please wear clothes that are easy to get on and off and wear slip-on shoes.  Thank you for allowing [place] to care for you!   --  Invasive Cardiovascular services

## 2019-12-16 NOTE — H&P (View-Only) (Signed)
Cardiology Office Note   Date:  12/16/2019   ID:  Hannah French, DOB 1941-08-19, MRN [id number]  PCP:  Dettinger, Fransisca Kaufmann, MD  Cardiologist:  Dr. Burt Knack  No chief complaint on file.     History of Present Illness: Hannah French is a 79 y.o. female who is here today for follow-up visit regarding peripheral arterial disease.  The patient has known history of peripheral arterial disease with  severe bilateral iliac stenosis and asymptomatic left subclavian artery stenosis. She has prolonged history of tobacco use and diabetes.  Other medical problems include chronic systolic heart failure, COPD, bladder cancer, hyperlipidemia, pancreatitis and pseudogout.  Most recent vascular studies in August of 2020 showed significantly elevated velocities in bilateral common iliac arteries with normal ABI. Carotid Doppler in August of this year showed moderate bilateral carotid stenosis with atypical flow in the left vertebral artery with possible right subclavian artery stenosis.  Right vertebral artery had antegrade flow.  I have previously recommended proceeding with angiography and possible endovascular intervention given severe iliac disease and bilateral leg claudication.  The patient wanted to hold off until COVID-19 pandemic is improved.  She received the vaccine with no issues.  She now reports significant worsening of bilateral leg pain with walking which is happening at the very short distance.  She has chronic exertional dyspnea with no chest pain.  No lower extremity ulceration.   Past Medical History:  Diagnosis Date  . Abdominal bruit   . Bladder cancer ([place])   . CAD (coronary artery disease)    a. nonobstructive by cath 08/2018.  . Carotid bruit   . Chronic combined systolic and diastolic CHF (congestive heart failure) ([place])   . CKD (chronic kidney disease), stage II   . Diabetes mellitus    Type II  . Dyslipidemia   . Heart murmur   . Mild pulmonary  hypertension ([place])   . Mitral valve prolapse    a. not seen on most recent echoes. Mild MR now.  Marland Kitchen NICM (nonischemic cardiomyopathy) ([place])   . Osteoporosis   . Pancreatitis, acute   . Pseudogout   . PVD (peripheral vascular disease) ([place])    a. 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  . Sinus tachycardia   . Tobacco abuse     Past Surgical History:  Procedure Laterality Date  . APPENDECTOMY    . Hysterectomy-type unspecified    . LAPAROSCOPIC CHOLECYSTECTOMY  2009  . RIGHT/LEFT HEART CATH AND CORONARY ANGIOGRAPHY N/A 09/16/2018   Procedure: RIGHT/LEFT HEART CATH AND CORONARY ANGIOGRAPHY;  Surgeon: Sherren Mocha, MD;  Location: [place] [facility];  Service: Cardiovascular;  Laterality: N/A;     Current Outpatient Medications  Medication Sig Dispense Refill  . blood glucose meter kit and supplies Use up to four times daily as directed 1 each 0  . Cholecalciferol (VITAMIN D) 50 MCG (2000 UT) tablet Take 6,000 Units by mouth daily.    . digoxin (LANOXIN) 0.125 MG tablet Take 0.5 tablets (0.0625 mg total) by mouth daily. 45 tablet 3  . empagliflozin (JARDIANCE) 10 MG TABS tablet Take 10 mg by mouth daily before breakfast. 30 tablet 6  . Fluticasone-Salmeterol (ADVAIR) 100-50 MCG/DOSE AEPB Inhale 1 puff into the lungs 2 (two) times daily. 1 each 3  . furosemide (LASIX) 20 MG tablet Take 2 tablets (40 mg total) by mouth daily. You may take an extra dose for increase weight gain or swelling 180 tablet 3  .  glimepiride (AMARYL) 2 MG tablet Take 1-2 tablets (2-4 mg total) by mouth daily with breakfast. 180 tablet 3  . glucose blood (ONETOUCH VERIO) test strip USE TO CHECK BLOOD GLUCOSE TWICE DAILY AND AS NEEDED 200 each 3  . ibuprofen (ADVIL,MOTRIN) 200 MG tablet Take 400 mg by mouth 2 (two) times daily as needed (for pain).     . Insulin Glargine (LANTUS SOLOSTAR) 100 UNIT/ML Solostar Pen Inject 10 Units into the skin daily. 5 pen PRN  .  metoprolol succinate (TOPROL-XL) 25 MG 24 hr tablet Take 1 tablet (25 mg total) by mouth daily. Take 1 tablet By Mouth daily 30 tablet 3  . ONETOUCH DELICA LANCETS 34J MISC USE   TO CHECK GLUCOSE TWICE DAILY AND AS NEEDED 100 each 2  . sertraline (ZOLOFT) 50 MG tablet Take 1 tablet (50 mg total) by mouth daily. 30 tablet 0  . diphenhydrAMINE (BENADRYL) 50 MG tablet Take one 50 mg tablet the morning of the procedure with the last dose of the prednisone. 1 tablet 0  . Insulin Pen Needle (B-D UF III MINI PEN NEEDLES) 31G X 5 MM MISC 1 each by Does not apply route 4 (four) times daily. (Patient not taking: Reported on 12/16/2019) 120 each 11  . predniSONE (DELTASONE) 50 MG tablet Take one 50 mg tablet 13 hours before the procedure, 7 hours before and then the last tablet before you leave. 3 tablet 0   No current facility-administered medications for this visit.    Allergies:   Contrast media [iodinated diagnostic agents], Iohexol, Metformin, Pravastatin, Statins, Albuterol, Bextra [valdecoxib], Cefdinir, Cozaar [losartan potassium], Nsaids, Zetia [ezetimibe], Rofecoxib, and Sulfa antibiotics    Social History:  The patient  reports that she has been smoking cigarettes. She has a 50.00 pack-year smoking history. She has never used smokeless tobacco. She reports that she does not drink alcohol or use drugs.   Family History:  [name] family history includes Heart attack in her brother and maternal uncle; Heart failure in her mother; Hypertension in her brother and sister.    ROS:  Please see the history of present illness.   Otherwise, review of systems are positive for none.   All other systems are reviewed and negative.    PHYSICAL EXAM: VS:  BP 128/60   Pulse 88   Temp (!) 97.2 F (36.2 C)   Ht 5' (1.524 m)   Wt 114 lb (51.7 kg)   BMI 22.26 kg/m  , BMI Body mass index is 22.26 kg/m. GEN: Well nourished, well developed, in no acute distress  HEENT: normal  Neck: no JVD, or masses.  Bilateral carotid bruits Cardiac: RRR; no rubs, or gallops,no edema . 3/6 systolic ejection murmur in the aortic area which is mid peaking Respiratory:  clear to auscultation bilaterally, normal work of breathing GI: soft, nontender, nondistended, + BS MS: no deformity or atrophy  Skin: warm and dry, no rash Neuro:  Strength and sensation are intact Psych: euthymic mood, full affect Vascular: Femoral pulses is +1 bilaterally.  Distal pulses are not palpable.    EKG:  EKG is ordered today. EKG showed normal sinus rhythm with left axis deviation and possible old septal infarct.   Recent Labs: 08/08/2019: ALT 8 11/19/2019: B Natriuretic Peptide 153.4; BUN 24; Creatinine, Ser 1.21; Hemoglobin 11.6; Platelets 323; Potassium 3.8; Sodium 139    Lipid Panel    Component Value Date/Time   CHOL 162 08/08/2019 1332   TRIG 139 08/08/2019 1332   TRIG  82 06/09/2014 1028   HDL 40 08/08/2019 1332   HDL 57 06/09/2014 1028   CHOLHDL 4.1 08/08/2019 1332   CHOLHDL 3.5 02/06/2013 0932   VLDL 20 02/06/2013 0932   LDLCALC 97 08/08/2019 1332   LDLCALC 131 (H) 06/09/2014 1028      Wt Readings from Last 3 Encounters:  12/16/19 114 lb (51.7 kg)  11/20/19 111 lb 6.4 oz (50.5 kg)  11/19/19 112 lb 6 oz (51 kg)       No flowsheet data found.    ASSESSMENT AND PLAN:  1.  Peripheral arterial disease: Severe bilateral common iliac artery stenosis bilaterally now with severe claudication.  This has significantly affected her activities of daily living due to that, I recommend proceeding with abdominal aortogram with lower extremity runoff and possible endovascular intervention.  I discussed the procedure in details as well as risks and benefits.  She reports easy bruising with low-dose aspirin and for that reason she does not take it at the present time.  I explained to her that she will need to be at least on 1 month of clopidogrel.  2.  subclavian artery stenosis: I do not think this is critical.   She has a palpable radial pulse bilaterally and she denies arm claudication or symptoms of steal syndrome.  3.  Chronic systolic heart failure: Followed by Dr. Haroldine Laws .  She is on optimal medical therapy  4.  Hyperlipidemia: Intolerance to statins and Zetia.  5.  Tobacco use: Unfortunately, she is not able to quit.  Disposition:   FU with me in 1 month  Signed,  Kathlyn Sacramento, MD  12/16/2019 11:19 AM    [place]

## 2019-12-16 NOTE — Progress Notes (Signed)
Cardiology Office Note   Date:  12/16/2019   ID:  Hannah French, DOB 1940-11-25, MRN [id number]  PCP:  Dettinger, Fransisca Kaufmann, MD  Cardiologist:  Dr. Burt Knack  No chief complaint on file.     History of Present Illness: Hannah French is a 79 y.o. female who is here today for follow-up visit regarding peripheral arterial disease.  The patient has known history of peripheral arterial disease with  severe bilateral iliac stenosis and asymptomatic left subclavian artery stenosis. She has prolonged history of tobacco use and diabetes.  Other medical problems include chronic systolic heart failure, COPD, bladder cancer, hyperlipidemia, pancreatitis and pseudogout.  Most recent vascular studies in August of 2020 showed significantly elevated velocities in bilateral common iliac arteries with normal ABI. Carotid Doppler in August of this year showed moderate bilateral carotid stenosis with atypical flow in the left vertebral artery with possible right subclavian artery stenosis.  Right vertebral artery had antegrade flow.  I have previously recommended proceeding with angiography and possible endovascular intervention given severe iliac disease and bilateral leg claudication.  The patient wanted to hold off until COVID-19 pandemic is improved.  She received the vaccine with no issues.  She now reports significant worsening of bilateral leg pain with walking which is happening at the very short distance.  She has chronic exertional dyspnea with no chest pain.  No lower extremity ulceration.   Past Medical History:  Diagnosis Date  . Abdominal bruit   . Bladder cancer ([place])   . CAD (coronary artery disease)    a. nonobstructive by cath 08/2018.  . Carotid bruit   . Chronic combined systolic and diastolic CHF (congestive heart failure) ([place])   . CKD (chronic kidney disease), stage II   . Diabetes mellitus    Type II  . Dyslipidemia   . Heart murmur   . Mild pulmonary  hypertension ([place])   . Mitral valve prolapse    a. not seen on most recent echoes. Mild MR now.  Marland Kitchen NICM (nonischemic cardiomyopathy) ([place])   . Osteoporosis   . Pancreatitis, acute   . Pseudogout   . PVD (peripheral vascular disease) ([place])    a. 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  . Sinus tachycardia   . Tobacco abuse     Past Surgical History:  Procedure Laterality Date  . APPENDECTOMY    . Hysterectomy-type unspecified    . LAPAROSCOPIC CHOLECYSTECTOMY  2009  . RIGHT/LEFT HEART CATH AND CORONARY ANGIOGRAPHY N/A 09/16/2018   Procedure: RIGHT/LEFT HEART CATH AND CORONARY ANGIOGRAPHY;  Surgeon: Sherren Mocha, MD;  Location: [facility];  Service: Cardiovascular;  Laterality: N/A;     Current Outpatient Medications  Medication Sig Dispense Refill  . blood glucose meter kit and supplies Use up to four times daily as directed 1 each 0  . Cholecalciferol (VITAMIN D) 50 MCG (2000 UT) tablet Take 6,000 Units by mouth daily.    . digoxin (LANOXIN) 0.125 MG tablet Take 0.5 tablets (0.0625 mg total) by mouth daily. 45 tablet 3  . empagliflozin (JARDIANCE) 10 MG TABS tablet Take 10 mg by mouth daily before breakfast. 30 tablet 6  . Fluticasone-Salmeterol (ADVAIR) 100-50 MCG/DOSE AEPB Inhale 1 puff into the lungs 2 (two) times daily. 1 each 3  . furosemide (LASIX) 20 MG tablet Take 2 tablets (40 mg total) by mouth daily. You may take an extra dose for increase weight gain or swelling 180 tablet 3  .  glimepiride (AMARYL) 2 MG tablet Take 1-2 tablets (2-4 mg total) by mouth daily with breakfast. 180 tablet 3  . glucose blood (ONETOUCH VERIO) test strip USE TO CHECK BLOOD GLUCOSE TWICE DAILY AND AS NEEDED 200 each 3  . ibuprofen (ADVIL,MOTRIN) 200 MG tablet Take 400 mg by mouth 2 (two) times daily as needed (for pain).     . Insulin Glargine (LANTUS SOLOSTAR) 100 UNIT/ML Solostar Pen Inject 10 Units into the skin daily. 5 pen PRN  .  metoprolol succinate (TOPROL-XL) 25 MG 24 hr tablet Take 1 tablet (25 mg total) by mouth daily. Take 1 tablet By Mouth daily 30 tablet 3  . ONETOUCH DELICA LANCETS 23V MISC USE   TO CHECK GLUCOSE TWICE DAILY AND AS NEEDED 100 each 2  . sertraline (ZOLOFT) 50 MG tablet Take 1 tablet (50 mg total) by mouth daily. 30 tablet 0  . diphenhydrAMINE (BENADRYL) 50 MG tablet Take one 50 mg tablet the morning of the procedure with the last dose of the prednisone. 1 tablet 0  . Insulin Pen Needle (B-D UF III MINI PEN NEEDLES) 31G X 5 MM MISC 1 each by Does not apply route 4 (four) times daily. (Patient not taking: Reported on 12/16/2019) 120 each 11  . predniSONE (DELTASONE) 50 MG tablet Take one 50 mg tablet 13 hours before the procedure, 7 hours before and then the last tablet before you leave. 3 tablet 0   No current facility-administered medications for this visit.    Allergies:   Contrast media [iodinated diagnostic agents], Iohexol, Metformin, Pravastatin, Statins, Albuterol, Bextra [valdecoxib], Cefdinir, Cozaar [losartan potassium], Nsaids, Zetia [ezetimibe], Rofecoxib, and Sulfa antibiotics    Social History:  The patient  reports that she has been smoking cigarettes. She has a 50.00 pack-year smoking history. She has never used smokeless tobacco. She reports that she does not drink alcohol or use drugs.   Family History:  [name] family history includes Heart attack in her brother and maternal uncle; Heart failure in her mother; Hypertension in her brother and sister.    ROS:  Please see the history of present illness.   Otherwise, review of systems are positive for none.   All other systems are reviewed and negative.    PHYSICAL EXAM: VS:  BP 128/60   Pulse 88   Temp (!) 97.2 F (36.2 C)   Ht 5' (1.524 m)   Wt 114 lb (51.7 kg)   BMI 22.26 kg/m  , BMI Body mass index is 22.26 kg/m. GEN: Well nourished, well developed, in no acute distress  HEENT: normal  Neck: no JVD, or masses.  Bilateral carotid bruits Cardiac: RRR; no rubs, or gallops,no edema . 3/6 systolic ejection murmur in the aortic area which is mid peaking Respiratory:  clear to auscultation bilaterally, normal work of breathing GI: soft, nontender, nondistended, + BS MS: no deformity or atrophy  Skin: warm and dry, no rash Neuro:  Strength and sensation are intact Psych: euthymic mood, full affect Vascular: Femoral pulses is +1 bilaterally.  Distal pulses are not palpable.    EKG:  EKG is ordered today. EKG showed normal sinus rhythm with left axis deviation and possible old septal infarct.   Recent Labs: 08/08/2019: ALT 8 11/19/2019: B Natriuretic Peptide 153.4; BUN 24; Creatinine, Ser 1.21; Hemoglobin 11.6; Platelets 323; Potassium 3.8; Sodium 139    Lipid Panel    Component Value Date/Time   CHOL 162 08/08/2019 1332   TRIG 139 08/08/2019 1332   TRIG  82 06/09/2014 1028   HDL 40 08/08/2019 1332   HDL 57 06/09/2014 1028   CHOLHDL 4.1 08/08/2019 1332   CHOLHDL 3.5 02/06/2013 0932   VLDL 20 02/06/2013 0932   LDLCALC 97 08/08/2019 1332   LDLCALC 131 (H) 06/09/2014 1028      Wt Readings from Last 3 Encounters:  12/16/19 114 lb (51.7 kg)  11/20/19 111 lb 6.4 oz (50.5 kg)  11/19/19 112 lb 6 oz (51 kg)       No flowsheet data found.    ASSESSMENT AND PLAN:  1.  Peripheral arterial disease: Severe bilateral common iliac artery stenosis bilaterally now with severe claudication.  This has significantly affected her activities of daily living due to that, I recommend proceeding with abdominal aortogram with lower extremity runoff and possible endovascular intervention.  I discussed the procedure in details as well as risks and benefits.  She reports easy bruising with low-dose aspirin and for that reason she does not take it at the present time.  I explained to her that she will need to be at least on 1 month of clopidogrel.  2.  subclavian artery stenosis: I do not think this is critical.   She has a palpable radial pulse bilaterally and she denies arm claudication or symptoms of steal syndrome.  3.  Chronic systolic heart failure: Followed by Dr. Haroldine Laws .  She is on optimal medical therapy  4.  Hyperlipidemia: Intolerance to statins and Zetia.  5.  Tobacco use: Unfortunately, she is not able to quit.  Disposition:   FU with me in 1 month  Signed,  Kathlyn Sacramento, MD  12/16/2019 11:19 AM    [place]

## 2019-12-23 ENCOUNTER — Ambulatory Visit (INDEPENDENT_AMBULATORY_CARE_PROVIDER_SITE_OTHER): Payer: Medicare Other | Admitting: Licensed Clinical Social Worker

## 2019-12-23 DIAGNOSIS — I5022 Chronic systolic (congestive) heart failure: Secondary | ICD-10-CM | POA: Diagnosis not present

## 2019-12-23 DIAGNOSIS — I2510 Atherosclerotic heart disease of native coronary artery without angina pectoris: Secondary | ICD-10-CM

## 2019-12-23 DIAGNOSIS — F339 Major depressive disorder, recurrent, unspecified: Secondary | ICD-10-CM | POA: Diagnosis not present

## 2019-12-23 DIAGNOSIS — J449 Chronic obstructive pulmonary disease, unspecified: Secondary | ICD-10-CM | POA: Diagnosis not present

## 2019-12-23 DIAGNOSIS — J441 Chronic obstructive pulmonary disease with (acute) exacerbation: Secondary | ICD-10-CM | POA: Diagnosis not present

## 2019-12-23 DIAGNOSIS — E1169 Type 2 diabetes mellitus with other specified complication: Secondary | ICD-10-CM | POA: Diagnosis not present

## 2019-12-23 NOTE — Chronic Care Management (AMB) (Signed)
  Care Management Note   Hannah French is a 79 y.o. year old female who is a primary care patient of Dettinger, Fransisca Kaufmann, MD. The CM team was consulted for assistance with chronic disease management and care coordination.   I reached out to Hannah French by phone today.     Review of patient status, including review of consultants reports, relevant laboratory and other test results, and collaboration with appropriate care team members and the patient's provider was performed as part of comprehensive patient evaluation and provision of chronic care management services.   Social determinants of health :risk of tobacco use; risk of depression  Medications   (very important)  New medications from outside sources are available for reconciliation  blood glucose meter kit and supplies Cholecalciferol (VITAMIN D) 50 MCG (2000 UT) tablet digoxin (LANOXIN) 0.125 MG tablet diphenhydrAMINE (BENADRYL) 50 MG tablet empagliflozin (JARDIANCE) 10 MG TABS tablet Fluticasone-Salmeterol (ADVAIR) 100-50 MCG/DOSE AEPB furosemide (LASIX) 20 MG tablet glimepiride (AMARYL) 2 MG tablet glucose blood (ONETOUCH VERIO) test strip ibuprofen (ADVIL,MOTRIN) 200 MG tablet Insulin Glargine (LANTUS SOLOSTAR) 100 UNIT/ML Solostar Pen Insulin Pen Needle (B-D UF III MINI PEN NEEDLES) 31G X 5 MM MISC metoprolol succinate (TOPROL-XL) 25 MG 24 hr tablet ONETOUCH DELICA LANCETS 56P MISC predniSONE (DELTASONE) 50 MG tablet sertraline (ZOLOFT) 50 MG tablet  Goals    . Client will talk with LCSW in next 30 days to discuss depression symptoms of client and to discuss stategies for managing depresion symptoms (pt-stated)     Current Barriers:  . Hearing challenges . Breathing challenges . Mental Health Challenges in client with Chronic Diagnoses of CHF, depression, recurrent, Type 2 DM, COPD, and CAD  Clinical Social Work Clinical Goal(s):  . Client will talk with LCSW in next 30 days to discuss  depression symptoms of client and management of client's depression symptoms  Interventions:  Previously talked with client about CCM program services  Talked with client previously about social support network (daughter and son)  Talked with client prevously about pain issues faced  Talked with client previously about her management of depression symptoms (client experienced the death of a family member several months ago )  Previously encouraged client to communicate with RNCM to discuss nursing needs of client  Previously talked with client about her upcoming medical appointments  Patient Self Care Activities:  . Takes medications as prescribed . Communicates regularly with family members for support  Plan:   Client to attend scheduled medical appointments Client or daughter to contact RNCM as needed to discuss nursing needs of client LCSW to call client in next 4 weeks to talk with client about depression symptoms of client and about her management of depression symptoms  Initial goal documentation       Follow Up Plan: LCSW to call client in next 4 weeks to discuss depression symptoms of client and to discuss management of client's depression symptoms  Norva Riffle.Aunesty Tyson MSW, LCSW Licensed Clinical Social Worker [facility] [phone]

## 2019-12-23 NOTE — Patient Instructions (Addendum)
Licensed Clinical Social Worker Visit Information  Goals we discussed today:  Goals    . Client will talk with LCSW in next 30 days to discuss depression symptoms of client and to discuss stategies for managing depresion symptoms (pt-stated)     Current Barriers:  . Hearing challenges . Breathing challenges . Mental Health Challenges in client with Chronic Diagnoses of CHF, depression, recurrent, Type 2 DM, COPD, and CAD  Clinical Social Work Clinical Goal(s):  . Client will talk with LCSW in next 30 days to discuss depression symptoms of client and management of client's depression symptoms  Interventions: Previously talked with client about CCM program services  Talked with client previously about social support network (daughter and son)  Talked with client prevously about pain issues faced  Talked with client previously about her management of depression symptoms (client experienced the death of a family member several months ago )  Previously encouraged client to communicate with RNCM to discuss nursing needs of client  Previously talked with client about her upcoming medical appointments  Patient Self Care Activities:  . Takes medications as prescribed . Communicates regularly with family members for support  Plan:   Client to attend scheduled medical appointments Client or daughter to contact RNCM as needed to discuss nursing needs of client LCSW to call client in next 4 weeks to talk with client about depression symptoms of client and about her management of depression symptoms  Initial goal documentation        Materials Provided: No  Follow Up Plan: LCSW to call client in next 4 weeks to talk with client about depression  symptoms of client and about her management of depression symptoms   The patient verbalized understanding of instructions provided today and declined a print copy of patient instruction materials.   Norva Riffle.Rahiem Schellinger MSW, LCSW Licensed Clinical  Social Worker [facility] [phone]

## 2019-12-24 ENCOUNTER — Telehealth: Payer: Self-pay

## 2019-12-24 NOTE — Telephone Encounter (Signed)
Scheduler came into triage and states that she was speaking with pt. Pt would like rx today for clopidogrel. She states that the day of the procedure she will not be able to fill due to transportation issues(she will not be able to drive herself) to have this filled, she will like to fill it before procedure, so she can pick it up herself to be taken after procedure.  Per LOV-"She reports easy bruising with low-dose aspirin and for that reason she does not take it at the present time.  I explained to her that she will need to be at least on 1 month of clopidogrel." OK to fill early? Please advise

## 2019-12-24 NOTE — Telephone Encounter (Addendum)
The patient has been called and advised that the provider is out until next Monday. She stated that it would be fine to wait until then.

## 2019-12-25 ENCOUNTER — Telehealth: Payer: Self-pay | Admitting: Family Medicine

## 2019-12-25 NOTE — Telephone Encounter (Signed)
Please review patient request and document as required.

## 2019-12-27 ENCOUNTER — Other Ambulatory Visit (HOSPITAL_COMMUNITY)
Admission: RE | Admit: 2019-12-27 | Discharge: 2019-12-27 | Disposition: A | Discharge status: Home / Self Care | Payer: Medicare Other | Source: Ambulatory Visit | Attending: Cardiovascular Disease | Admitting: Cardiovascular Disease

## 2019-12-27 DIAGNOSIS — Z20822 Contact with and (suspected) exposure to covid-19: Secondary | ICD-10-CM | POA: Diagnosis not present

## 2019-12-27 DIAGNOSIS — Z01812 Encounter for preprocedural laboratory examination: Secondary | ICD-10-CM | POA: Insufficient documentation

## 2019-12-27 LAB — SARS CORONAVIRUS 2 (TAT 6-24 HRS): SARS Coronavirus 2: NEGATIVE

## 2019-12-29 ENCOUNTER — Telehealth: Payer: Self-pay | Admitting: *Deleted

## 2019-12-29 ENCOUNTER — Other Ambulatory Visit: Payer: Medicare Other

## 2019-12-29 ENCOUNTER — Other Ambulatory Visit: Payer: Self-pay

## 2019-12-29 DIAGNOSIS — I739 Peripheral vascular disease, unspecified: Secondary | ICD-10-CM | POA: Diagnosis not present

## 2019-12-29 DIAGNOSIS — Z01812 Encounter for preprocedural laboratory examination: Secondary | ICD-10-CM

## 2019-12-29 LAB — BASIC METABOLIC PANEL
BUN/Creatinine Ratio: 19 (ref 12–28)
BUN: 29 mg/dL — ABNORMAL HIGH (ref 8–27)
CO2: 25 mmol/L (ref 20–29)
Calcium: 9.5 mg/dL (ref 8.7–10.3)
Chloride: 98 mmol/L (ref 96–106)
Creatinine, Ser: 1.52 mg/dL — ABNORMAL HIGH (ref 0.57–1.00)
GFR calc Af Amer: 37 mL/min/{1.73_m2} — ABNORMAL LOW (ref >59)
GFR calc non Af Amer: 32 mL/min/{1.73_m2} — ABNORMAL LOW (ref >59)
Glucose: 112 mg/dL — ABNORMAL HIGH (ref 65–99)
Potassium: 4.5 mmol/L (ref 3.5–5.2)
Sodium: 138 mmol/L (ref 134–144)

## 2019-12-29 MED ORDER — CLOPIDOGREL BISULFATE 75 MG PO TABS: 75.0000 mg | ORAL_TABLET | Freq: Every day | ORAL | 0 refills | Status: DC

## 2019-12-29 NOTE — Telephone Encounter (Signed)
Yes that is fine.  Plavix 75 mg once daily.

## 2019-12-29 NOTE — Telephone Encounter (Addendum)
Pt contacted pre-abdominal aortogram scheduled at [facility] for:  Wednesday December 31, 2019 8:30 AM  12/16/19 BMP-GFR 41 I spoke with patient and she is going to [facility] to update BMP (within 7 days of procedure) prior to procedure 12/31/19.

## 2019-12-29 NOTE — Telephone Encounter (Signed)
Plavix 75 mg once daily has been sent in for the patient. She has verbalized her understanding. She will start it after her procedure on Wednesday.

## 2019-12-30 NOTE — Telephone Encounter (Signed)
Pt contacted pre-catheterization scheduled at [facility] for: Wednesday December 31, 2019 10:30 AM Verified arrival time and place: [place] [facility]) at: 6:30 AM-pre procedure hydration per Dr Fletcher Anon   No solid food after midnight prior to cath, clear  liquids until 5 AM day of procedure. Contrast allergy: yes 13 hour Prednisone and Benadryl Prep reviewed with patient: Prednisone 50 mg 9:30 PM 12/30/19 Prednisone 50mg  3:30 AM 12/31/19  Prednisone 50 mg and Benadryl 50 mg -pt instructed to take with her to hospital and take at hospital AM of procedure 10:30 AM-pt to let hospital RN know I have asked her to do this.   Hold: Lasix-day before and day of procedure-GFR 30 Glimepiride-AM of procedure Jardiance-AM of procedure Pt states she does not use Insulin.  Except hold medications AM meds can be  taken pre-cath with sip of water including. See phone note 12/24/19 about Plavix---Office note 12/16/19 about ASA    Confirmed patient has responsible adult to drive home post procedure and observe 24 hours after arriving home: yes  Currently, due to Covid-19 pandemic, only one person will be allowed with patient. Must be the same person for patient's entire stay and will be required to wear a mask. They will be asked to wait in the waiting room for the duration of the patient's stay.  Patients are required to wear a mask when they enter the hospital.      COVID-19 Pre-Screening Questions:  . In the past 7 to 10 days have you had a cough,  shortness of breath, headache, congestion, fever (100 or greater) body aches, chills, sore throat, or sudden loss of taste or sense of smell? no . Have you been around anyone with known Covid 19 in the past 7-10 days? no . Have you been around anyone who is awaiting Covid 19 test results in the past 7 to 10 days? no . Have you been around anyone who  has mentioned symptoms of Covid 19 within the past 7 to 10 days? no  Reviewed  procedure/mask/visitor instructions, COVID-19 screening questions with patient.     Moderna COVID-19 vaccine at [facility] Department per patient:              November 06, 2019              December 05, 2019

## 2019-12-31 ENCOUNTER — Encounter (HOSPITAL_COMMUNITY): Payer: Self-pay | Admitting: Cardiovascular Disease

## 2019-12-31 ENCOUNTER — Encounter (HOSPITAL_COMMUNITY)
Admission: RE | Disposition: A | Discharge status: Home / Self Care | Payer: Self-pay | Source: Home / Self Care | Attending: Cardiovascular Disease

## 2019-12-31 ENCOUNTER — Other Ambulatory Visit: Payer: Self-pay

## 2019-12-31 ENCOUNTER — Ambulatory Visit (HOSPITAL_COMMUNITY)
Admission: RE | Admit: 2019-12-31 | Discharge: 2019-12-31 | Disposition: A | Discharge status: Home / Self Care | Payer: Medicare Other | Attending: Cardiovascular Disease | Admitting: Cardiovascular Disease

## 2019-12-31 DIAGNOSIS — N182 Chronic kidney disease, stage 2 (mild): Secondary | ICD-10-CM | POA: Insufficient documentation

## 2019-12-31 DIAGNOSIS — E1151 Type 2 diabetes mellitus with diabetic peripheral angiopathy without gangrene: Secondary | ICD-10-CM | POA: Diagnosis not present

## 2019-12-31 DIAGNOSIS — Z79899 Other long term (current) drug therapy: Secondary | ICD-10-CM | POA: Diagnosis not present

## 2019-12-31 DIAGNOSIS — I341 Nonrheumatic mitral (valve) prolapse: Secondary | ICD-10-CM | POA: Insufficient documentation

## 2019-12-31 DIAGNOSIS — Z882 Allergy status to sulfonamides status: Secondary | ICD-10-CM | POA: Insufficient documentation

## 2019-12-31 DIAGNOSIS — Z8249 Family history of ischemic heart disease and other diseases of the circulatory system: Secondary | ICD-10-CM | POA: Insufficient documentation

## 2019-12-31 DIAGNOSIS — M1990 Unspecified osteoarthritis, unspecified site: Secondary | ICD-10-CM | POA: Diagnosis not present

## 2019-12-31 DIAGNOSIS — Z7952 Long term (current) use of systemic steroids: Secondary | ICD-10-CM | POA: Diagnosis not present

## 2019-12-31 DIAGNOSIS — I5022 Chronic systolic (congestive) heart failure: Secondary | ICD-10-CM | POA: Insufficient documentation

## 2019-12-31 DIAGNOSIS — J449 Chronic obstructive pulmonary disease, unspecified: Secondary | ICD-10-CM | POA: Insufficient documentation

## 2019-12-31 DIAGNOSIS — Z886 Allergy status to analgesic agent status: Secondary | ICD-10-CM | POA: Insufficient documentation

## 2019-12-31 DIAGNOSIS — F17210 Nicotine dependence, cigarettes, uncomplicated: Secondary | ICD-10-CM | POA: Insufficient documentation

## 2019-12-31 DIAGNOSIS — E1122 Type 2 diabetes mellitus with diabetic chronic kidney disease: Secondary | ICD-10-CM | POA: Diagnosis not present

## 2019-12-31 DIAGNOSIS — I272 Pulmonary hypertension, unspecified: Secondary | ICD-10-CM | POA: Insufficient documentation

## 2019-12-31 DIAGNOSIS — Z888 Allergy status to other drugs, medicaments and biological substances status: Secondary | ICD-10-CM | POA: Diagnosis not present

## 2019-12-31 DIAGNOSIS — E785 Hyperlipidemia, unspecified: Secondary | ICD-10-CM | POA: Diagnosis not present

## 2019-12-31 DIAGNOSIS — Z794 Long term (current) use of insulin: Secondary | ICD-10-CM | POA: Insufficient documentation

## 2019-12-31 DIAGNOSIS — I428 Other cardiomyopathies: Secondary | ICD-10-CM | POA: Diagnosis not present

## 2019-12-31 DIAGNOSIS — I70213 Atherosclerosis of native arteries of extremities with intermittent claudication, bilateral legs: Secondary | ICD-10-CM | POA: Diagnosis not present

## 2019-12-31 DIAGNOSIS — I2510 Atherosclerotic heart disease of native coronary artery without angina pectoris: Secondary | ICD-10-CM | POA: Diagnosis not present

## 2019-12-31 DIAGNOSIS — I771 Stricture of artery: Secondary | ICD-10-CM | POA: Diagnosis not present

## 2019-12-31 DIAGNOSIS — I739 Peripheral vascular disease, unspecified: Secondary | ICD-10-CM

## 2019-12-31 DIAGNOSIS — I70211 Atherosclerosis of native arteries of extremities with intermittent claudication, right leg: Secondary | ICD-10-CM

## 2019-12-31 DIAGNOSIS — Z7951 Long term (current) use of inhaled steroids: Secondary | ICD-10-CM | POA: Insufficient documentation

## 2019-12-31 DIAGNOSIS — Z91041 Radiographic dye allergy status: Secondary | ICD-10-CM | POA: Diagnosis not present

## 2019-12-31 HISTORY — PX: ABDOMINAL AORTOGRAM W/LOWER EXTREMITY: CATH118223

## 2019-12-31 HISTORY — PX: PERIPHERAL VASCULAR INTERVENTION: CATH118257

## 2019-12-31 LAB — POCT ACTIVATED CLOTTING TIME: Activated Clotting Time: 186 seconds

## 2019-12-31 LAB — GLUCOSE, CAPILLARY
Glucose-Capillary: 273 mg/dL — ABNORMAL HIGH (ref 70–99)
Glucose-Capillary: 204 mg/dL — ABNORMAL HIGH (ref 70–99)

## 2019-12-31 SURGERY — ABDOMINAL AORTOGRAM W/LOWER EXTREMITY
Anesthesia: LOCAL

## 2019-12-31 MED ORDER — HEPARIN SODIUM (PORCINE) 1000 UNIT/ML IJ SOLN
INTRAMUSCULAR | Status: AC
  Filled 2019-12-31: qty 1

## 2019-12-31 MED ORDER — CLOPIDOGREL BISULFATE 300 MG PO TABS
ORAL_TABLET | ORAL | Status: DC | PRN
  Administered 2019-12-31: 300 mg via ORAL

## 2019-12-31 MED ORDER — SODIUM CHLORIDE 0.9% FLUSH: 3.0000 mL | INTRAVENOUS | Status: DC | PRN

## 2019-12-31 MED ORDER — CLOPIDOGREL BISULFATE 75 MG PO TABS
75.0000 mg | ORAL_TABLET | Freq: Once | ORAL | Status: AC
  Administered 2019-12-31: 75 mg via ORAL
  Filled 2019-12-31: qty 1

## 2019-12-31 MED ORDER — MIDAZOLAM HCL 2 MG/2ML IJ SOLN
INTRAMUSCULAR | Status: AC
  Filled 2019-12-31: qty 2

## 2019-12-31 MED ORDER — HEPARIN SODIUM (PORCINE) 1000 UNIT/ML IJ SOLN
INTRAMUSCULAR | Status: DC | PRN
  Administered 2019-12-31: 4000 [IU] via INTRAVENOUS

## 2019-12-31 MED ORDER — HEPARIN (PORCINE) IN NACL 1000-0.9 UT/500ML-% IV SOLN
INTRAVENOUS | Status: AC
  Filled 2019-12-31: qty 500

## 2019-12-31 MED ORDER — SODIUM CHLORIDE 0.9 % IV SOLN: INTRAVENOUS | Status: DC

## 2019-12-31 MED ORDER — HEPARIN (PORCINE) IN NACL 1000-0.9 UT/500ML-% IV SOLN
INTRAVENOUS | Status: DC | PRN
  Administered 2019-12-31: 500 mL

## 2019-12-31 MED ORDER — SODIUM CHLORIDE 0.9% FLUSH: 3.0000 mL | Freq: Two times a day (BID) | INTRAVENOUS | Status: DC

## 2019-12-31 MED ORDER — LIDOCAINE HCL (PF) 1 % IJ SOLN
INTRAMUSCULAR | Status: DC | PRN
  Administered 2019-12-31 (×2): 15 mL

## 2019-12-31 MED ORDER — ACETAMINOPHEN 325 MG PO TABS: 650.0000 mg | ORAL_TABLET | ORAL | Status: DC | PRN

## 2019-12-31 MED ORDER — CLOPIDOGREL BISULFATE 300 MG PO TABS
ORAL_TABLET | ORAL | Status: AC
  Filled 2019-12-31: qty 1

## 2019-12-31 MED ORDER — SODIUM CHLORIDE 0.9 % IV SOLN: 250.0000 mL | INTRAVENOUS | Status: DC | PRN

## 2019-12-31 MED ORDER — FENTANYL CITRATE (PF) 100 MCG/2ML IJ SOLN
INTRAMUSCULAR | Status: AC
  Filled 2019-12-31: qty 2

## 2019-12-31 MED ORDER — FENTANYL CITRATE (PF) 100 MCG/2ML IJ SOLN
INTRAMUSCULAR | Status: DC | PRN
  Administered 2019-12-31 (×2): 12.5 ug via INTRAVENOUS

## 2019-12-31 MED ORDER — MIDAZOLAM HCL 2 MG/2ML IJ SOLN
INTRAMUSCULAR | Status: DC | PRN
  Administered 2019-12-31: 1 mg via INTRAVENOUS

## 2019-12-31 MED ORDER — ASPIRIN 81 MG PO CHEW
81.0000 mg | CHEWABLE_TABLET | ORAL | Status: AC
  Administered 2019-12-31: 81 mg via ORAL
  Filled 2019-12-31: qty 1

## 2019-12-31 MED ORDER — ONDANSETRON HCL 4 MG/2ML IJ SOLN: 4.0000 mg | Freq: Four times a day (QID) | INTRAMUSCULAR | Status: DC | PRN

## 2019-12-31 MED ORDER — IODIXANOL 320 MG/ML IV SOLN
INTRAVENOUS | Status: DC | PRN
  Administered 2019-12-31: 105 mL via INTRA_ARTERIAL

## 2019-12-31 MED ORDER — LABETALOL HCL 5 MG/ML IV SOLN: 10.0000 mg | INTRAVENOUS | Status: DC | PRN

## 2019-12-31 MED ORDER — LIDOCAINE HCL (PF) 1 % IJ SOLN
INTRAMUSCULAR | Status: AC
  Filled 2019-12-31: qty 30

## 2019-12-31 SURGICAL SUPPLY — 18 items
CATH BEACON 5 .035 65 KMP TIP (CATHETERS) ×1 IMPLANT
CATH OMNI FLUSH 5F 65CM (CATHETERS) ×1 IMPLANT
CLOSURE MYNX CONTROL 6F/7F (Vascular Products) ×2 IMPLANT
KIT ENCORE 26 ADVANTAGE (KITS) ×2 IMPLANT
KIT MICROPUNCTURE NIT STIFF (SHEATH) ×1 IMPLANT
KIT PV (KITS) ×3 IMPLANT
SHEATH BRITE TIP 7FR 35CM (SHEATH) ×2 IMPLANT
SHEATH PINNACLE 5F 10CM (SHEATH) ×1 IMPLANT
SHEATH PINNACLE 7F 10CM (SHEATH) ×2 IMPLANT
SHEATH PROBE COVER 6X72 (BAG) ×1 IMPLANT
STENT OMNILINK ELITE 8X29X80 (Permanent Stent) ×1 IMPLANT
STENT OMNILINK ELITE 8X39X80 (Permanent Stent) ×1 IMPLANT
STOPCOCK MORSE 400PSI 3WAY (MISCELLANEOUS) ×1 IMPLANT
SYR MEDRAD MARK 7 150ML (SYRINGE) ×3 IMPLANT
TRANSDUCER W/STOPCOCK (MISCELLANEOUS) ×3 IMPLANT
TRAY PV CATH (CUSTOM PROCEDURE TRAY) ×3 IMPLANT
TUBING CIL FLEX 10 FLL-RA (TUBING) ×1 IMPLANT
WIRE BENTSON .035X145CM (WIRE) ×2 IMPLANT

## 2019-12-31 NOTE — Progress Notes (Signed)
Attempted to call pt  Daughter message left for her to call me back

## 2019-12-31 NOTE — Progress Notes (Signed)
Ambulated in hallway and to the bathroom to void tol well.  

## 2019-12-31 NOTE — Discharge Instructions (Signed)
Femoral Site Care This sheet gives you information about how to care for yourself after your procedure. Your health care provider may also give you more specific instructions. If you have problems or questions, contact your health care provider. What can I expect after the procedure? After the procedure, it is common to have:  Bruising that usually fades within 1-2 weeks.  Tenderness at the site. Follow these instructions at home: Wound care  Follow instructions from your health care provider about how to take care of your insertion site. Make sure you: ? Wash your hands with soap and water before you change your bandage (dressing). If soap and water are not available, use hand sanitizer. ? Change your dressing as told by your health care provider. ? Leave stitches (sutures), skin glue, or adhesive strips in place. These skin closures may need to stay in place for 2 weeks or longer. If adhesive strip edges start to loosen and curl up, you may trim the loose edges. Do not remove adhesive strips completely unless your health care provider tells you to do that.  Do not take baths, swim, or use a hot tub until your health care provider approves.  You may shower 24-48 hours after the procedure or as told by your health care provider. ? Gently wash the site with plain soap and water. ? Pat the area dry with a clean towel. ? Do not rub the site. This may cause bleeding.  Do not apply powder or lotion to the site. Keep the site clean and dry.  Check your femoral site every day for signs of infection. Check for: ? Redness, swelling, or pain. ? Fluid or blood. ? Warmth. ? Pus or a bad smell. Activity  For the first 2-3 days after your procedure, or as long as directed: ? Avoid climbing stairs as much as possible. ? Do not squat.  Do not lift anything that is heavier than 10 lb (4.5 kg), or the limit that you are told, until your health care provider says that it is safe.  Rest as  directed. ? Avoid sitting for a long time without moving. Get up to take short walks every 1-2 hours.  Do not drive for 24 hours if you were given a medicine to help you relax (sedative). General instructions  Take over-the-counter and prescription medicines only as told by your health care provider.  Keep all follow-up visits as told by your health care provider. This is important. Contact a health care provider if you have:  A fever or chills.  You have redness, swelling, or pain around your insertion site. Get help right away if:  The catheter insertion area swells very fast.  You pass out.  You suddenly start to sweat or your skin gets clammy.  The catheter insertion area is bleeding, and the bleeding does not stop when you hold steady pressure on the area.  The area near or just beyond the catheter insertion site becomes pale, cool, tingly, or numb. These symptoms may represent a serious problem that is an emergency. Do not wait to see if the symptoms will go away. Get medical help right away. Call your local emergency services (911 in the [place] Do not drive yourself to the hospital. Summary  After the procedure, it is common to have bruising that usually fades within 1-2 weeks.  Check your femoral site every day for signs of infection.  Do not lift anything that is heavier than 10 lb (4.5 kg), or the   limit that you are told, until your health care provider says that it is safe. This information is not intended to replace advice given to you by your health care provider. Make sure you discuss any questions you have with your health care provider. Document Revised: 09/24/2017 Document Reviewed: 09/24/2017 Elsevier Patient Education  2020 [employer]vier Inc[employer]  

## 2019-12-31 NOTE — Progress Notes (Signed)
Discharge instructions reviewed with pt and her daughter (via telephone) both voice understanding.  

## 2019-12-31 NOTE — Interval H&P Note (Signed)
History and Physical Interval Note:  12/31/2019 10:46 AM  Hannah French  has presented today for surgery, with the diagnosis of pad.  The various methods of treatment have been discussed with the patient and family. After consideration of risks, benefits and other options for treatment, the patient has consented to  Procedure(s): ABDOMINAL AORTOGRAM W/LOWER EXTREMITY (N/A) as a surgical intervention.  [name] history has been reviewed, patient examined, no change in status, stable for surgery.  I have reviewed the patient's chart and labs.  Questions were answered to the patient's satisfaction.     Kathlyn Sacramento

## 2020-01-01 ENCOUNTER — Telehealth: Payer: Self-pay | Admitting: Cardiovascular Disease

## 2020-01-01 NOTE — Telephone Encounter (Signed)
New Message:     Pt had stents put in yesterday. She says she thinks her leg is bleeding out.

## 2020-01-01 NOTE — Telephone Encounter (Signed)
Pt called to report that she had bilateral stent put in her groin yesterday... she says the right side is fine but the left side is very bruised.. she had her daughter look at it and she thought is more looked like "bleeding" under the skin. She denies edema, and denies pain but says the area is "sensitive".   She has not noticed any blood seeping through her badnage at the site. The bruising or "bleeding" goed 3 inches down her leg from the groin. It is not painful to walk. She is feeling well.. no dizziness, or light headedness. She does not know what her BP is.   Will forward to Dr. Fletcher Anon for review. Her daughter is trying to figure out how to send pics through her My Chart.

## 2020-01-01 NOTE — Telephone Encounter (Signed)
Spoke with patients daughter per release form and provider did review photos and bruising is to be expected considering her procedure. There is no active blood and I did inform her that it may take some time for that bruising to go away. Dr. Fletcher Anon did review images and this bruising is nothing to worry about. She verbalized understanding with no further questions at this time.

## 2020-01-09 ENCOUNTER — Other Ambulatory Visit: Payer: Self-pay

## 2020-01-09 ENCOUNTER — Ambulatory Visit (HOSPITAL_BASED_OUTPATIENT_CLINIC_OR_DEPARTMENT_OTHER)
Admission: RE | Admit: 2020-01-09 | Discharge: 2020-01-09 | Disposition: A | Discharge status: Home / Self Care | Payer: Medicare Other | Source: Ambulatory Visit | Attending: Cardiovascular Disease | Admitting: Cardiovascular Disease

## 2020-01-09 ENCOUNTER — Other Ambulatory Visit (HOSPITAL_COMMUNITY): Payer: Self-pay | Admitting: Cardiovascular Disease

## 2020-01-09 ENCOUNTER — Ambulatory Visit (HOSPITAL_COMMUNITY)
Admission: RE | Admit: 2020-01-09 | Discharge: 2020-01-09 | Disposition: A | Discharge status: Home / Self Care | Payer: Medicare Other | Source: Ambulatory Visit | Attending: Cardiovascular Disease | Admitting: Cardiovascular Disease

## 2020-01-09 DIAGNOSIS — I739 Peripheral vascular disease, unspecified: Secondary | ICD-10-CM

## 2020-01-09 DIAGNOSIS — Z95828 Presence of other vascular implants and grafts: Secondary | ICD-10-CM

## 2020-01-14 ENCOUNTER — Telehealth: Payer: Self-pay | Admitting: Cardiovascular Disease

## 2020-01-14 NOTE — Telephone Encounter (Signed)
Spoke with pt, schedule follow up appointment tomorrow with pa to access groin site.

## 2020-01-14 NOTE — Telephone Encounter (Signed)
Is being address via a phone call.

## 2020-01-14 NOTE — Telephone Encounter (Signed)
Pt complaining of increasing redness and swelling at incision site in her right groin.  Pt states having drainage of pus and blood from site.  Denies fever or chills at this time.  Will forward information to Dr Fletcher Anon and RN for review and further advisement.  Pt verbalizes understanding and agrees with current plan.

## 2020-01-14 NOTE — Telephone Encounter (Signed)
New Message:   Pt says her incision where she had her stent is draining. She says she has an infection and needs an antibiotic asap please.

## 2020-01-15 ENCOUNTER — Other Ambulatory Visit: Payer: Self-pay

## 2020-01-15 ENCOUNTER — Ambulatory Visit: Payer: Medicare Other | Admitting: Medical

## 2020-01-15 ENCOUNTER — Encounter: Payer: Self-pay | Admitting: Medical

## 2020-01-15 VITALS — BP 134/60 | HR 70 | Ht 60.0 in | Wt 110.6 lb

## 2020-01-15 DIAGNOSIS — I739 Peripheral vascular disease, unspecified: Secondary | ICD-10-CM

## 2020-01-15 DIAGNOSIS — I10 Essential (primary) hypertension: Secondary | ICD-10-CM | POA: Diagnosis not present

## 2020-01-15 DIAGNOSIS — Z720 Tobacco use: Secondary | ICD-10-CM

## 2020-01-15 DIAGNOSIS — L0291 Cutaneous abscess, unspecified: Secondary | ICD-10-CM

## 2020-01-15 LAB — BASIC METABOLIC PANEL
BUN/Creatinine Ratio: 22 (ref 12–28)
BUN: 30 mg/dL — ABNORMAL HIGH (ref 8–27)
CO2: 28 mmol/L (ref 20–29)
Calcium: 9.6 mg/dL (ref 8.7–10.3)
Chloride: 97 mmol/L (ref 96–106)
Creatinine, Ser: 1.36 mg/dL — ABNORMAL HIGH (ref 0.57–1.00)
GFR calc Af Amer: 43 mL/min/{1.73_m2} — ABNORMAL LOW (ref >59)
GFR calc non Af Amer: 37 mL/min/{1.73_m2} — ABNORMAL LOW (ref >59)
Glucose: 207 mg/dL — ABNORMAL HIGH (ref 65–99)
Potassium: 5.1 mmol/L (ref 3.5–5.2)
Sodium: 138 mmol/L (ref 134–144)

## 2020-01-15 NOTE — Progress Notes (Signed)
Cardiology Office Note    Date:  01/15/2020   ID:  Hannah French, DOB [birth year], MRN [id number]  PCP:  Dettinger, Fransisca Kaufmann, MD  Cardiologist:  Sherren Mocha, MD  Electrophysiologist:  None   Chief Complaint: Groin check  History of Present Illness:   Hannah French is a 79 y.o. female with history of PAD with several bilateral iliac stenosis and symptomatic left subclavian artery stenosis, moderate bilateral carotid stenosis, tobacco use, diabetes, chronic systolic heart failure, COPD, bladder cancer, HLD, pancreatitis, and pseudogout. Most recent vascular studies August 2020 showed significantly elevated velocities in bilateral common iliac arteries with normal ABI. Dr. Fletcher Anon previously recommended angiograpgy with possible intervention but patient wanted to hold off until COVID had improved. She underwent angiography 12/31/19 showed significant stenosis affecting distal aorta above the iliac bifurcation extending into both common iliac arteries treated with successful bilateral common iliac artery kissing stent placement extending into the distal aorta. Patient was started on Aspirin an plavix.   Patient comes in today for groin pain. She is accompanied a family member. The patient had a follow-up echo last week and the tech apparently pushed the probe very rough over the right groin site. The next day the patient had some tenderness and redness which eventually develop into a small skin abscess. Yesterday the abscess popped and drained yellow puss. There was also a firm area underneath the skin close to the abscess and the patient's daughter was worried.  Today they report that the abscess is looking better and not as red. Her panty line is irritating the area so she has some tenderness. Denies fever or chills. Also reports some skin sensitivity posterior B/L calves with some mild redness. Has some vague lower abdominal pain, no back pain.   Past Medical History:  Diagnosis  Date  . Abdominal bruit   . Bladder cancer ([place])   . CAD (coronary artery disease)    a. nonobstructive by cath 08/2018.  . Carotid bruit   . Chronic combined systolic and diastolic CHF (congestive heart failure) ([place])   . CKD (chronic kidney disease), stage II   . Diabetes mellitus    Type II  . Dyslipidemia   . Heart murmur   . Mild pulmonary hypertension ([place])   . Mitral valve prolapse    a. not seen on most recent echoes. Mild MR now.  Marland Kitchen NICM (nonischemic cardiomyopathy) ([place])   . Osteoporosis   . Pancreatitis, acute   . Pseudogout   . PVD (peripheral vascular disease) ([place])    a. 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  . Sinus tachycardia   . Tobacco abuse     Past Surgical History:  Procedure Laterality Date  . ABDOMINAL AORTOGRAM W/LOWER EXTREMITY N/A 12/31/2019   Procedure: ABDOMINAL AORTOGRAM W/LOWER EXTREMITY;  Surgeon: Wellington Hampshire, MD;  Location: [facility];  Service: Cardiovascular;  Laterality: N/A;  . APPENDECTOMY    . Hysterectomy-type unspecified    . LAPAROSCOPIC CHOLECYSTECTOMY  2009  . PERIPHERAL VASCULAR INTERVENTION  12/31/2019   Procedure: PERIPHERAL VASCULAR INTERVENTION;  Surgeon: Wellington Hampshire, MD;  Location: [facility];  Service: Cardiovascular;;  Bilateral Iliacs  . RIGHT/LEFT HEART CATH AND CORONARY ANGIOGRAPHY N/A 09/16/2018   Procedure: RIGHT/LEFT HEART CATH AND CORONARY ANGIOGRAPHY;  Surgeon: Sherren Mocha, MD;  Location: [facility];  Service: Cardiovascular;  Laterality: N/A;    Current Medications: Current Meds  Medication Sig  . blood  glucose meter kit and supplies Use up to four times daily as directed  . Cholecalciferol (VITAMIN D) 50 MCG (2000 UT) tablet Take 4,000-6,000 Units by mouth daily.   . clopidogrel (PLAVIX) 75 MG tablet Take 1 tablet (75 mg total) by mouth daily.  . digoxin (LANOXIN) 0.125 MG tablet Take 0.5 tablets (0.0625 mg total) by mouth  daily.  . empagliflozin (JARDIANCE) 10 MG TABS tablet Take 10 mg by mouth daily before breakfast.  . Fluticasone-Salmeterol (ADVAIR) 100-50 MCG/DOSE AEPB Inhale 1 puff into the lungs 2 (two) times daily.  . furosemide (LASIX) 20 MG tablet Take 2 tablets (40 mg total) by mouth daily. You may take an extra dose for increase weight gain or swelling  . glimepiride (AMARYL) 2 MG tablet Take 1-2 tablets (2-4 mg total) by mouth daily with breakfast.  . glucose blood (ONETOUCH VERIO) test strip USE TO CHECK BLOOD GLUCOSE TWICE DAILY AND AS NEEDED  . ibuprofen (ADVIL,MOTRIN) 200 MG tablet Take 400 mg by mouth 2 (two) times daily as needed (for pain).   . Insulin Pen Needle (B-D UF III MINI PEN NEEDLES) 31G X 5 MM MISC 1 each by Does not apply route 4 (four) times daily.  . metoprolol succinate (TOPROL-XL) 25 MG 24 hr tablet Take 1 tablet (25 mg total) by mouth daily. Take 1 tablet By Mouth daily (Patient taking differently: Take 25 mg by mouth daily. )  . ONETOUCH DELICA LANCETS 62B MISC USE   TO CHECK GLUCOSE TWICE DAILY AND AS NEEDED    Allergies:   Contrast media [iodinated diagnostic agents], Iohexol, Metformin, Statins, Albuterol, Bextra [valdecoxib], Cefdinir, Cozaar [losartan potassium], Nsaids, Zetia [ezetimibe], Rofecoxib, and Sulfa antibiotics   Social History   Socioeconomic History  . Marital status: Divorced    Spouse name: Not on file  . Number of children: 2  . Years of education: Not on file  . Highest education level: Not on file  Occupational History  . Occupation: Retired  Tobacco Use  . Smoking status: Current Every Day Smoker    Packs/day: 1.00    Years: 50.00    Pack years: 50.00    Types: Cigarettes  . Smokeless tobacco: Never Used  Substance and Sexual Activity  . Alcohol use: No  . Drug use: No  . Sexual activity: Not on file  Other Topics Concern  . Not on file  Social History Narrative  . Not on file   Social Determinants of Health   Financial Resource  Strain:   . Difficulty of Paying Living Expenses:   Food Insecurity:   . Worried About Charity fundraiser in the Last Year:   . Arboriculturist in the Last Year:   Transportation Needs:   . Film/video editor (Medical):   Marland Kitchen Lack of Transportation (Non-Medical):   Physical Activity:   . Days of Exercise per Week:   . Minutes of Exercise per Session:   Stress:   . Feeling of Stress :   Social Connections:   . Frequency of Communication with Friends and Family:   . Frequency of Social Gatherings with Friends and Family:   . Attends Religious Services:   . Active Member of Clubs or Organizations:   . Attends Archivist Meetings:   Marland Kitchen Marital Status:      Family History:  [name] *family history includes Heart attack in her brother and maternal uncle; Heart failure in her mother; Hypertension in her brother and sister. There is  no history of Coronary artery disease.  ROS:   Please see the history of present illness. Otherwise, review of systems is positive for  All other systems are reviewed and otherwise negative.    EKGs/Labs/Other Studies Reviewed:    Studies reviewed are outlined and summarized above. Reports included below if pertinent.  PVD 12/31/19 1.  Significant stenosis affecting the distal aorta above the iliac bifurcation extending into both common iliac arteries. 2.  Successful bilateral common iliac artery kissing stent placement extending into the distal aorta.  Recommendations: Dual antiplatelet therapy for at least 1 month. Aggressive treatment of risk factors. Hydrate for 6 hours post procedure given underlying chronic kidney disease. The procedure was extremely difficult due to the patient's continuous involuntary movements of her legs.  Avoid procedures with conscious sedation in the future.    EKG:  Not ordered  Recent Labs: 08/08/2019: ALT 8 11/19/2019: B Natriuretic Peptide 153.4 12/16/2019: Hemoglobin 12.4; Platelets 312 12/29/2019:  BUN 29; Creatinine, Ser 1.52; Potassium 4.5; Sodium 138  Recent Lipid Panel    Component Value Date/Time   CHOL 162 08/08/2019 1332   TRIG 139 08/08/2019 1332   TRIG 82 06/09/2014 1028   HDL 40 08/08/2019 1332   HDL 57 06/09/2014 1028   CHOLHDL 4.1 08/08/2019 1332   CHOLHDL 3.5 02/06/2013 0932   VLDL 20 02/06/2013 0932   LDLCALC 97 08/08/2019 1332   LDLCALC 131 (H) 06/09/2014 1028    PHYSICAL EXAM:    VS:  BP 134/60   Pulse 70   Ht 5' (1.524 m)   Wt 110 lb 9.6 oz (50.2 kg)   BMI 21.60 kg/m   BMI: Body mass index is 21.6 kg/m.  GEN: Well nourished, well developed, in no acute distress HEENT: normocephalic, atraumatic Neck: no JVD, carotid bruits, or masses Cardiac: RRR; no murmurs, rubs, or gallops, no edema  Respiratory:  clear to auscultation bilaterally, normal work of breathing GI: soft, nontender, nondistended, + BS MS: right groin skin abscess 0.5x1cm with minimal erythema surrounding opening, no drainage seen; rt groin 2.5cm hematoma, non-tender, -bruit; left groin hematoma 1 cm non tender Skin: warm and dry, no rash Neuro:  Alert and Oriented x 3, Strength and sensation are intact, follows commands Psych: euthymic mood, full affect  Wt Readings from Last 3 Encounters:  01/15/20 110 lb 9.6 oz (50.2 kg)  12/31/19 111 lb (50.3 kg)  12/16/19 114 lb (51.7 kg)     ASSESSMENT & PLAN:   Small skin abscess, right groin Patient had recent lower leg angiography procedure with bilateral femoral artery stick. After follow-up echo the patient developed 0.5cmx1cm skin abscess that popped and drained yesterday. On exam it appears she has a hematoma behind the abscess. Hematoma is about 2.5cm and does not appear to be connected to the abscess. Hematoma is non-tender and non-pulsatile. No bruit on exam - Abscess appears to be healing. Recommended the patient keep the abscess site clean and dry. Apply bacitracin with a bandage to prevent irrigation - No back pain but has vague  abdominal pain. - Advised patient to come in or call if she has fever, chills, worsening redness or increase in size of the abscess - Will check CBC to check for anemia and WBC. If white count is elevated will likely start oral antibiotics.  - Follow-up with Dr. Fletcher Anon scheduled for 5/11  PAD s/p stent to bilateral common iliac artery 12/31/19 - aspirin and plavix - suspect B/L calf redness and sensitivity is from the plavix. Encouraged  patient to continue with this for now. Might discontinue this after one month.  HTN - Toprol-XL 25 mg daily - pressure stable today  Tobacco Use - recommended cessation given that this will impair healing  Chronic systolic Heart failure - Follows with [facility]ine [facility], last seen 11/19/19 - Echo 04/2019 with EF 25-30% - euvolemic on exam    Disposition: F/u with with Dr. Fletcher Anon 02/03/20   Medication Adjustments/Labs and Tests Ordered: Current medicines are reviewed at length with the patient today.  Concerns regarding medicines are outlined above. Medication changes, Labs and Tests ordered today are summarized above and listed in the Patient Instructions accessible in Encounters.   Signed, Catriona Dillenbeck Ninfa Meeker, PA-C  01/15/2020 11:55 AM    [facility] [address], [place], [place]  [address] Phone: [phone]; Fax: [phone]

## 2020-01-15 NOTE — Patient Instructions (Signed)
Medication Instructions:  NO CHANGE *If you need a refill on your cardiac medications before your next appointment, please call your pharmacy*   Lab Work: Your physician recommends that you HAVE LAB WORK TODAY  If you have labs (blood work) drawn today and your tests are completely normal, you will receive your results only by: Marland Kitchen MyChart Message (if you have MyChart) OR . A paper copy in the mail If you have any lab test that is abnormal or we need to change your treatment, we will call you to review the results.   Follow-Up: At [facility], you and your health needs are our priority.  As part of our continuing mission to provide you with exceptional heart care, we have created designated Provider Care Teams.  These Care Teams include your primary Cardiologist (physician) and Advanced Practice Providers (APPs -  Physician Assistants and Nurse Practitioners) who all work together to provide you with the care you need, when you need it.  We recommend signing up for the patient portal called "MyChart".  Sign up information is provided on this After Visit Summary.  MyChart is used to connect with patients for Virtual Visits (Telemedicine).  Patients are able to view lab/test results, encounter notes, upcoming appointments, etc.  Non-urgent messages can be sent to your provider as well.   To learn more about what you can do with MyChart, go to [email or web address].    Your next appointment:   AS SCHEDULED

## 2020-01-19 ENCOUNTER — Other Ambulatory Visit: Payer: Medicare Other

## 2020-01-19 ENCOUNTER — Other Ambulatory Visit: Payer: Self-pay

## 2020-01-19 ENCOUNTER — Other Ambulatory Visit: Payer: Self-pay | Admitting: Medical

## 2020-01-19 ENCOUNTER — Telehealth: Payer: Self-pay | Admitting: *Deleted

## 2020-01-19 DIAGNOSIS — L0291 Cutaneous abscess, unspecified: Secondary | ICD-10-CM | POA: Diagnosis not present

## 2020-01-19 DIAGNOSIS — L02214 Cutaneous abscess of groin: Secondary | ICD-10-CM

## 2020-01-19 LAB — CBC
Hematocrit: 32 % — ABNORMAL LOW (ref 34.0–46.6)
Hemoglobin: 11 g/dL — ABNORMAL LOW (ref 11.1–15.9)
MCH: 33.7 pg — ABNORMAL HIGH (ref 26.6–33.0)
MCHC: 34.4 g/dL (ref 31.5–35.7)
MCV: 98 fL — ABNORMAL HIGH (ref 79–97)
Platelets: 353 10*3/uL (ref 150–450)
RBC: 3.26 x10E6/uL — ABNORMAL LOW (ref 3.77–5.28)
RDW: 14.7 % (ref 11.7–15.4)
WBC: 10.1 10*3/uL (ref 3.4–10.8)

## 2020-01-19 MED ORDER — DOXYCYCLINE MONOHYDRATE 100 MG PO TABS: 100.0000 mg | ORAL_TABLET | Freq: Two times a day (BID) | ORAL | 0 refills | Status: DC

## 2020-01-19 MED ORDER — DOXYCYCLINE HYCLATE 100 MG PO TABS: 100.0000 mg | ORAL_TABLET | Freq: Two times a day (BID) | ORAL | Status: DC

## 2020-01-19 NOTE — Telephone Encounter (Signed)
The patient has had adverse reactions to antibiotics in the class with Keflex. Per Dr. Fletcher Anon, she can take doxycycline 100 mg bid for 5 days.  The patient has been made aware and verbalized her understanding. Lab work has already been completed.

## 2020-01-19 NOTE — Telephone Encounter (Signed)
I sent a message to the PA to start Keflex but I guess that has not been done yet.  Start Keflex 500 mg every 8 hours for 5 days.  Proceed with CBC anyway but she can start antibiotic regardless.

## 2020-01-19 NOTE — Telephone Encounter (Signed)
Left a message for the patient to call back.  

## 2020-01-19 NOTE — Telephone Encounter (Addendum)
Call placed to the patient pertaining to the MyChart message from the patient's daughter in law. The patient had a pv angiogram on 12/31/19. On 01/09/20, the patient had an Aorta/Iliac completed. She stated that the tech pressed down on the site which made her scream in pain. Since then site has been red and tender. This has progressively began to worsen.  The patient was seen in the office last week for assessment and was told to apply an antibiotic cream and have labwork completed. A BMET was ordered but the CBC was not completed.  The patient stated that the site is now progressively getting worse. She denies a fever. The site is red, hot and tender to the touch. It is draining a yellow discharge. She has to change the bandage three times a day.  A CBC has been ordered for her. She will go to her local [facility] provider office to have this completed today.     Hi Dr Fletcher Anon this is Jeans daughter in law and son in [place]. We are very concerned about our mothers draining in the area of her surgical site.  She states no pain but very sensitive to touch. Also says no fever or chills. Hard to tell with the cold weather. The drs notes in her chart say that y'all were going to do CBC to check for anemia and WBC. If white count is elevated will likely start oral antibiotics. We did not see that bloodwork for CBC yesterday in her chart only BMP. She is stating the area is CONTINUOUSLY draining. She is keeping clean and covered.  Concerned that you are not in the office until next week and not sure if we should wait that long to start antibiotic if needed. If she needs to do CBC bloodwork my sister can take her.  We would greatly appreciate it if someone could contact her asap. We thank you!

## 2020-01-20 NOTE — Progress Notes (Signed)
Stable renal function and electrolyte

## 2020-01-21 ENCOUNTER — Telehealth: Payer: Self-pay | Admitting: Family Medicine

## 2020-01-21 ENCOUNTER — Ambulatory Visit (INDEPENDENT_AMBULATORY_CARE_PROVIDER_SITE_OTHER): Payer: Medicare Other | Admitting: Licensed Clinical Social Worker

## 2020-01-21 DIAGNOSIS — I2510 Atherosclerotic heart disease of native coronary artery without angina pectoris: Secondary | ICD-10-CM

## 2020-01-21 DIAGNOSIS — E1169 Type 2 diabetes mellitus with other specified complication: Secondary | ICD-10-CM | POA: Diagnosis not present

## 2020-01-21 DIAGNOSIS — F339 Major depressive disorder, recurrent, unspecified: Secondary | ICD-10-CM

## 2020-01-21 DIAGNOSIS — I5022 Chronic systolic (congestive) heart failure: Secondary | ICD-10-CM | POA: Diagnosis not present

## 2020-01-21 DIAGNOSIS — J441 Chronic obstructive pulmonary disease with (acute) exacerbation: Secondary | ICD-10-CM

## 2020-01-21 NOTE — Chronic Care Management (AMB) (Signed)
  Chronic Care Management    Clinical Social Work Follow Up Note  01/21/2020 Name: Hannah French MRN: [id number] DOB: [birth year]  Hannah French is a 79 y.o. year old female who is a primary care patient of Dettinger, Fransisca Kaufmann, MD. The CCM team was consulted for assistance with [employer] .   Review of patient status, including review of consultants reports, other relevant assessments, and collaboration with appropriate care team members and the patient's provider was performed as part of comprehensive patient evaluation and provision of chronic care management services.    SDOH (Social Determinants of Health) assessments performed: Yes;risk for tobacco use; risk of depression  Outpatient Encounter Medications as of 01/21/2020  Medication Sig Note  . blood glucose meter kit and supplies Use up to four times daily as directed   . Cholecalciferol (VITAMIN D) 50 MCG (2000 UT) tablet Take 4,000-6,000 Units by mouth daily.  12/24/2019: Pt switches between 4000 and 6000 units daily, doesn't have a specific regimen  . clopidogrel (PLAVIX) 75 MG tablet Take 1 tablet (75 mg total) by mouth daily.   . digoxin (LANOXIN) 0.125 MG tablet Take 0.5 tablets (0.0625 mg total) by mouth daily.   Marland Kitchen doxycycline (ADOXA) 100 MG tablet Take 1 tablet (100 mg total) by mouth 2 (two) times daily.   . empagliflozin (JARDIANCE) 10 MG TABS tablet Take 10 mg by mouth daily before breakfast.   . Fluticasone-Salmeterol (ADVAIR) 100-50 MCG/DOSE AEPB Inhale 1 puff into the lungs 2 (two) times daily.   . furosemide (LASIX) 20 MG tablet Take 2 tablets (40 mg total) by mouth daily. You may take an extra dose for increase weight gain or swelling   . glimepiride (AMARYL) 2 MG tablet Take 1-2 tablets (2-4 mg total) by mouth daily with breakfast. 12/24/2019: Would take 4 mg if blood sugar is 125 or more  . glucose blood (ONETOUCH VERIO) test strip USE TO CHECK BLOOD GLUCOSE TWICE DAILY AND AS NEEDED   . ibuprofen  (ADVIL,MOTRIN) 200 MG tablet Take 400 mg by mouth 2 (two) times daily as needed (for pain).    . Insulin Pen Needle (B-D UF III MINI PEN NEEDLES) 31G X 5 MM MISC 1 each by Does not apply route 4 (four) times daily.   . metoprolol succinate (TOPROL-XL) 25 MG 24 hr tablet Take 1 tablet (25 mg total) by mouth daily. Take 1 tablet By Mouth daily (Patient taking differently: Take 25 mg by mouth daily. )   . ONETOUCH DELICA LANCETS 74U MISC USE   TO CHECK GLUCOSE TWICE DAILY AND AS NEEDED    No facility-administered encounter medications on file as of 01/21/2020.     LCSW talked with client today via phone. Client said she wished to discharge from CCM program services today.  LCSW talked with client about CCM program services; LCSW informed client that LCSW would document that she wished to discharge from CCM services today.  Norva Riffle.Bralyn Espino MSW, LCSW Licensed Clinical Social Worker [facility] [phone]

## 2020-01-21 NOTE — Telephone Encounter (Signed)
Patient had vascular surgery on 12/31/19. Due to tech error patient got "some infection in her body". When the doctor ordered labs on the 01/19/20 to check her infection they noticed her hemoglobin was low and told her to take supplements. Patient wants Dr. Warrick Parisian to review lab work and give his thoughts, and what supplement should she take.  Patient was advised hemoglobin could be a little low after surgery had been preformed. Please advise and send back to pools.

## 2020-01-21 NOTE — Telephone Encounter (Signed)
Patient aware and verbalized understanding. °

## 2020-01-21 NOTE — Telephone Encounter (Signed)
I looked at her blood work and do see where her hemoglobin is a little bit low, based on this I would recommend to take protein and iron supplements or make sure she is getting enough protein with iron in it to help boost this, her kidney functions look stable.  The hemoglobin was only down just slightly, normal is 12 and it was 11.1, so I would not be too concerned about it and I think it will come up nicely but she can take protein and iron supplements once a day or increase her protein intake and that should help boost it back up.

## 2020-01-21 NOTE — Patient Instructions (Addendum)
Licensed Clinical Social Worker Visit Information  Materials Provided: No  Hannah French is a 79 y.o. year old female who is a primary care patient of Dettinger, Fransisca Kaufmann, MD. The CCM team was consulted for assistance with [employer] .   Review of patient status, including review of consultants reports, other relevant assessments, and collaboration with appropriate care team members and the patient's provider was performed as part of comprehensive patient evaluation and provision of chronic care management services.   SDOH (Social Determinants of Health) assessments performed: Yes;risk for tobacco use; risk of depression  LCSW talked with client today via phone. Client said she wished to discharge from CCM program services today.  LCSW talked with client about CCM program services; LCSW informed client that LCSW would document that she wished to discharge from CCM services today.  The patient verbalized understanding of instructions provided today and declined a print copy of patient instruction materials.   Norva Riffle.Dorina Ribaudo MSW, LCSW Licensed Clinical Social Worker [facility] [phone]

## 2020-01-29 ENCOUNTER — Telehealth: Payer: Self-pay | Admitting: Cardiovascular Disease

## 2020-01-29 NOTE — Telephone Encounter (Signed)
New Message     Pt is calling about her appt and says she will need assistance because she uses a wheel chair    Please advise

## 2020-01-29 NOTE — Telephone Encounter (Signed)
Patient reports she has difficulty understanding and mobility issues and daughter Heriberto Antigua will be coming to 5/11 post-procedure appt.

## 2020-02-03 ENCOUNTER — Encounter: Payer: Self-pay | Admitting: Cardiovascular Disease

## 2020-02-03 ENCOUNTER — Other Ambulatory Visit: Payer: Self-pay

## 2020-02-03 ENCOUNTER — Ambulatory Visit: Payer: Medicare Other | Admitting: Cardiovascular Disease

## 2020-02-03 VITALS — BP 117/63 | HR 87 | Ht 60.0 in | Wt 109.0 lb

## 2020-02-03 DIAGNOSIS — I771 Stricture of artery: Secondary | ICD-10-CM

## 2020-02-03 DIAGNOSIS — E785 Hyperlipidemia, unspecified: Secondary | ICD-10-CM

## 2020-02-03 DIAGNOSIS — I5022 Chronic systolic (congestive) heart failure: Secondary | ICD-10-CM

## 2020-02-03 DIAGNOSIS — I739 Peripheral vascular disease, unspecified: Secondary | ICD-10-CM | POA: Diagnosis not present

## 2020-02-03 MED ORDER — ASPIRIN EC 81 MG PO TBEC: 81.0000 mg | DELAYED_RELEASE_TABLET | Freq: Every day | ORAL | 3 refills | Status: DC

## 2020-02-03 NOTE — Progress Notes (Signed)
Cardiology Office Note   Date:  02/03/2020   ID:  Hannah French, DOB 1941/08/28, MRN [id number]  PCP:  Dettinger, Fransisca Kaufmann, MD  Cardiologist:  Dr. Burt Knack  No chief complaint on file.     History of Present Illness: Hannah French is a 79 y.o. female who is here today for follow-up visit regarding peripheral arterial disease.  The patient has known history of peripheral arterial disease with  severe bilateral iliac stenosis and asymptomatic left subclavian artery stenosis. She has prolonged history of tobacco use and diabetes.  Other medical problems include chronic systolic heart failure, COPD, bladder cancer, hyperlipidemia, pancreatitis and pseudogout.  Most recent vascular studies in August of 2020 showed significantly elevated velocities in bilateral common iliac arteries with normal ABI. Carotid Doppler in August of this year showed moderate bilateral carotid stenosis with atypical flow in the left vertebral artery with possible right subclavian artery stenosis.  Right vertebral artery had antegrade flow.  The patient had significant bilateral leg claudication and ultimately I proceeded with angiography in April which showed significant stenosis affecting the distal aorta above the iliac bifurcation extending into both iliac arteries.  I performed successful bilateral common iliac artery kissing stent placement extending into the distal aorta.  The procedure was extremely difficult due to the patient's continuous involuntary movements of her legs.  Bilateral Mynx closure device was used.  The patient developed some redness and tenderness in the right groin after she had her Doppler study done.  There was a concern about infection and the patient was treated with doxycycline.  Postprocedure duplex showed patent iliac stents with normal velocities.  The patient reports improvement in her leg pain.  She does report though that shortly after the procedure she did have some  purple discoloration in both calfs and she thought it was due to Plavix.  It improved after she stopped Plavix after 1 month.   Past Medical History:  Diagnosis Date  . Abdominal bruit   . Bladder cancer ([place])   . CAD (coronary artery disease)    a. nonobstructive by cath 08/2018.  . Carotid bruit   . Chronic combined systolic and diastolic CHF (congestive heart failure) ([place])   . CKD (chronic kidney disease), stage II   . Diabetes mellitus    Type II  . Dyslipidemia   . Heart murmur   . Mild pulmonary hypertension ([place])   . Mitral valve prolapse    a. not seen on most recent echoes. Mild MR now.  Marland Kitchen NICM (nonischemic cardiomyopathy) ([place])   . Osteoporosis   . Pancreatitis, acute   . Pseudogout   . PVD (peripheral vascular disease) ([place])    a. 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  . Sinus tachycardia   . Tobacco abuse     Past Surgical History:  Procedure Laterality Date  . ABDOMINAL AORTOGRAM W/LOWER EXTREMITY N/A 12/31/2019   Procedure: ABDOMINAL AORTOGRAM W/LOWER EXTREMITY;  Surgeon: Wellington Hampshire, MD;  Location: [facility];  Service: Cardiovascular;  Laterality: N/A;  . APPENDECTOMY    . Hysterectomy-type unspecified    . LAPAROSCOPIC CHOLECYSTECTOMY  2009  . PERIPHERAL VASCULAR INTERVENTION  12/31/2019   Procedure: PERIPHERAL VASCULAR INTERVENTION;  Surgeon: Wellington Hampshire, MD;  Location: [facility];  Service: Cardiovascular;;  Bilateral Iliacs  . RIGHT/LEFT HEART CATH AND CORONARY ANGIOGRAPHY N/A 09/16/2018   Procedure: RIGHT/LEFT HEART CATH AND CORONARY ANGIOGRAPHY;  Surgeon: Sherren Mocha, MD;  Location: [facility];  Service: Cardiovascular;  Laterality: N/A;     Current Outpatient Medications  Medication Sig Dispense Refill  . blood glucose meter kit and supplies Use up to four times daily as directed 1 each 0  . Cholecalciferol (VITAMIN D) 50 MCG (2000 UT) tablet Take 4,000-6,000  Units by mouth daily.     . clopidogrel (PLAVIX) 75 MG tablet Take 1 tablet (75 mg total) by mouth daily. 30 tablet 0  . digoxin (LANOXIN) 0.125 MG tablet Take 0.5 tablets (0.0625 mg total) by mouth daily. 45 tablet 3  . doxycycline (ADOXA) 100 MG tablet Take 1 tablet (100 mg total) by mouth 2 (two) times daily. 10 tablet 0  . empagliflozin (JARDIANCE) 10 MG TABS tablet Take 10 mg by mouth daily before breakfast. 30 tablet 6  . Fluticasone-Salmeterol (ADVAIR) 100-50 MCG/DOSE AEPB Inhale 1 puff into the lungs 2 (two) times daily. 1 each 3  . furosemide (LASIX) 20 MG tablet Take 2 tablets (40 mg total) by mouth daily. You may take an extra dose for increase weight gain or swelling 180 tablet 3  . glimepiride (AMARYL) 2 MG tablet Take 1-2 tablets (2-4 mg total) by mouth daily with breakfast. 180 tablet 3  . glucose blood (ONETOUCH VERIO) test strip USE TO CHECK BLOOD GLUCOSE TWICE DAILY AND AS NEEDED 200 each 3  . ibuprofen (ADVIL,MOTRIN) 200 MG tablet Take 400 mg by mouth 2 (two) times daily as needed (for pain).     . Insulin Pen Needle (B-D UF III MINI PEN NEEDLES) 31G X 5 MM MISC 1 each by Does not apply route 4 (four) times daily. 120 each 11  . metoprolol succinate (TOPROL-XL) 25 MG 24 hr tablet Take 1 tablet (25 mg total) by mouth daily. Take 1 tablet By Mouth daily (Patient taking differently: Take 25 mg by mouth daily. ) 30 tablet 3  . ONETOUCH DELICA LANCETS 67Y MISC USE   TO CHECK GLUCOSE TWICE DAILY AND AS NEEDED 100 each 2   No current facility-administered medications for this visit.    Allergies:   Contrast media [iodinated diagnostic agents], Iohexol, Metformin, Statins, Albuterol, Bextra [valdecoxib], Cefdinir, Cozaar [losartan potassium], Nsaids, Zetia [ezetimibe], Rofecoxib, and Sulfa antibiotics    Social History:  The patient  reports that she has been smoking cigarettes. She has a 50.00 pack-year smoking history. She has never used smokeless tobacco. She reports that she does  not drink alcohol or use drugs.   Family History:  [name] family history includes Heart attack in her brother and maternal uncle; Heart failure in her mother; Hypertension in her brother and sister.    ROS:  Please see the history of present illness.   Otherwise, review of systems are positive for none.   All other systems are reviewed and negative.    PHYSICAL EXAM: VS:  BP 117/63   Pulse 87   Ht 5' (1.524 m)   Wt 109 lb (49.4 kg)   SpO2 97%   BMI 21.29 kg/m  , BMI Body mass index is 21.29 kg/m. GEN: Well nourished, well developed, in no acute distress  HEENT: normal  Neck: no JVD, or masses. Bilateral carotid bruits Cardiac: RRR; no rubs, or gallops,no edema . 3/6 systolic ejection murmur in the aortic area which is mid peaking Respiratory:  clear to auscultation bilaterally, normal work of breathing GI: soft, nontender, nondistended, + BS MS: no deformity or atrophy  Skin: warm and dry, no rash Neuro:  Strength  and sensation are intact Psych: euthymic mood, full affect Vascular: Femoral pulses is +2 bilaterally.  Distal pulses are not palpable.    EKG:  EKG is not ordered today.    Recent Labs: 08/08/2019: ALT 8 11/19/2019: B Natriuretic Peptide 153.4 01/15/2020: BUN 30; Creatinine, Ser 1.36; Potassium 5.1; Sodium 138 01/19/2020: Hemoglobin 11.0; Platelets 353    Lipid Panel    Component Value Date/Time   CHOL 162 08/08/2019 1332   TRIG 139 08/08/2019 1332   TRIG 82 06/09/2014 1028   HDL 40 08/08/2019 1332   HDL 57 06/09/2014 1028   CHOLHDL 4.1 08/08/2019 1332   CHOLHDL 3.5 02/06/2013 0932   VLDL 20 02/06/2013 0932   LDLCALC 97 08/08/2019 1332   LDLCALC 131 (H) 06/09/2014 1028      Wt Readings from Last 3 Encounters:  02/03/20 109 lb (49.4 kg)  01/15/20 110 lb 9.6 oz (50.2 kg)  12/31/19 111 lb (50.3 kg)       No flowsheet data found.    ASSESSMENT AND PLAN:  1.  Peripheral arterial disease: Status post bilateral common iliac artery stent  placement extending into the distal aorta.  Leg symptoms improved overall.  She does have some chronic bluish discoloration of the toes and I suspect she likely has some infrainguinal disease also.  It is also possible that she had some distal embolization during the procedure especially with her continued movement throughout the procedure.  The patient is not a candidate for conscious sedation in the future and if procedures are needed, it should be done with general anesthesia. She stopped taking Plavix and I asked her to start taking aspirin 81 mg daily.  2.  subclavian artery stenosis: I do not think this is critical.  She has a palpable radial pulse bilaterally and she denies arm claudication or symptoms of steal syndrome.  3.  Chronic systolic heart failure: Followed by Dr. Haroldine Laws .  She is on optimal medical therapy  4.  Hyperlipidemia: Intolerance to statins and Zetia.  5.  Tobacco use: Unfortunately, she is not able to quit.   Disposition:   FU with me in 6 months  Signed,  Kathlyn Sacramento, MD  02/03/2020 10:38 AM    [place]

## 2020-02-03 NOTE — Patient Instructions (Signed)
Medication Instructions:  START Aspirin 81 mg once daily  *If you need a refill on your cardiac medications before your next appointment, please call your pharmacy*   Lab Work: None ordered If you have labs (blood work) drawn today and your tests are completely normal, you will receive your results only by: Marland Kitchen MyChart Message (if you have MyChart) OR . A paper copy in the mail If you have any lab test that is abnormal or we need to change your treatment, we will call you to review the results.   Testing/Procedures: Your physician has requested that you have an Aorta/Iliac Duplex in October. This will be take place at [place], [address].    No food after 11PM the night before.  Water is OK. (Don't drink liquids if you have been instructed not to for ANOTHER test).  Take two Extra-Strength Gas-X capsules at bedtime the night before test.   Take an additional two Extra-Strength Gas-X capsules three (3) hours before the test or first thing in the morning.    Avoid foods that produce bowel gas, for 24 hours prior to exam (see below).    No breakfast, no chewing gum, no smoking or carbonated beverages.  Patient may take morning medications with water.  Come in for test at least 15 minutes early to register.  Your physician has requested that you have an ankle brachial index (ABI) in October. During this test an ultrasound and blood pressure cuff are used to evaluate the arteries that supply the arms and legs with blood. Allow thirty minutes for this exam. There are no restrictions or special instructions. This will take place at [place], [address].   Follow-Up: At [facility], you and your health needs are our priority.  As part of our continuing mission to provide you with exceptional heart care, we have created designated Provider Care Teams.  These Care Teams include your primary Cardiologist (physician) and Advanced Practice Providers (APPs -  Physician  Assistants and Nurse Practitioners) who all work together to provide you with the care you need, when you need it.  We recommend signing up for the patient portal called "MyChart".  Sign up information is provided on this After Visit Summary.  MyChart is used to connect with patients for Virtual Visits (Telemedicine).  Patients are able to view lab/test results, encounter notes, upcoming appointments, etc.  Non-urgent messages can be sent to your provider as well.   To learn more about what you can do with MyChart, go to [email or web address].    Your next appointment:   6 month(s)  The format for your next appointment:   In Person  Provider:   Kathlyn Sacramento, MD

## 2020-02-17 ENCOUNTER — Ambulatory Visit (INDEPENDENT_AMBULATORY_CARE_PROVIDER_SITE_OTHER): Payer: Medicare Other | Admitting: Family Medicine

## 2020-02-17 ENCOUNTER — Encounter: Payer: Self-pay | Admitting: Family Medicine

## 2020-02-17 ENCOUNTER — Other Ambulatory Visit: Payer: Self-pay

## 2020-02-17 VITALS — BP 124/72 | HR 91 | Temp 98.2°F | Ht 60.0 in | Wt 110.0 lb

## 2020-02-17 DIAGNOSIS — E785 Hyperlipidemia, unspecified: Secondary | ICD-10-CM | POA: Diagnosis not present

## 2020-02-17 DIAGNOSIS — E559 Vitamin D deficiency, unspecified: Secondary | ICD-10-CM

## 2020-02-17 DIAGNOSIS — E1169 Type 2 diabetes mellitus with other specified complication: Secondary | ICD-10-CM | POA: Diagnosis not present

## 2020-02-17 DIAGNOSIS — J449 Chronic obstructive pulmonary disease, unspecified: Secondary | ICD-10-CM | POA: Diagnosis not present

## 2020-02-17 LAB — BAYER DCA HB A1C WAIVED: HB A1C (BAYER DCA - WAIVED): 8.6 % — ABNORMAL HIGH (ref <7.0)

## 2020-02-17 MED ORDER — TRESIBA FLEXTOUCH 100 UNIT/ML ~~LOC~~ SOPN: 10.0000 [IU] | PEN_INJECTOR | Freq: Every day | SUBCUTANEOUS | 3 refills | Status: DC

## 2020-02-17 NOTE — Addendum Note (Signed)
Addended by: Caryl Pina on: 02/17/2020 11:44 AM   Modules accepted: Orders

## 2020-02-17 NOTE — Progress Notes (Signed)
BP 124/72   Pulse 91   Temp 98.2 F (36.8 C) (Temporal)   Ht 5' (1.524 m)   Wt 110 lb (49.9 kg)   SpO2 97%   BMI 21.48 kg/m    Subjective:   Patient ID: Hannah French, female    DOB: [birth year], 79 y.o.   MRN: [id number]  HPI: Hannah French is a 79 y.o. female presenting on 02/17/2020 for Medical Management of Chronic Issues   HPI Type 2 diabetes mellitus Patient comes in today for recheck of his diabetes. Patient has been currently taking glipizide and Jardiance, A1c is 8.6 today, she did have some steroids which is affected. Patient is not currently on an ACE inhibitor/ARB. Patient has not seen an ophthalmologist this year. Patient denies any issues with their feet. The symptom started onset as an adult hyperlipidemia ARE RELATED TO DM   Hyperlipidemia Patient is coming in for recheck of his hyperlipidemia. The patient is currently taking no medication currently has been intolerant of multiple statins. They deny any issues with myalgias or history of liver damage from it. They deny any focal numbness or weakness or chest pain.   COPD Patient is coming in for COPD recheck today.  He is currently on Advair.  He has a mild chronic cough but denies any major coughing spells or wheezing spells.  He has 5nighttime symptoms per week and 5daytime symptoms per week currently.  She has a baseline amount of wheezing but it is her baseline and she functions at this baseline.  We discussed changes and she declined  Relevant past medical, surgical, family and social history reviewed and updated as indicated. Interim medical history since our last visit reviewed. Allergies and medications reviewed and updated.  Review of Systems  Constitutional: Negative for chills and fever.  Eyes: Negative for visual disturbance.  Respiratory: Negative for chest tightness and shortness of breath.   Cardiovascular: Negative for chest pain and leg swelling.  Musculoskeletal: Negative for  back pain and gait problem.  Skin: Negative for rash.  Neurological: Negative for light-headedness and headaches.  Psychiatric/Behavioral: Negative for agitation and behavioral problems.  All other systems reviewed and are negative.   Per HPI unless specifically indicated above   Allergies as of 02/17/2020      Reactions   Contrast Media [iodinated Diagnostic Agents] Shortness Of Breath   Iohexol Other (See Comments)   PASSED OUT DURING THE TEST   Metformin Swelling   Face and hands became swollen   Statins Swelling, Other (See Comments)   They make the patient hurt "all over" Myalgia, also   Albuterol Other (See Comments)   "Takes the skin off of the inside of my mouth"   Bextra [valdecoxib] Nausea And Vomiting, Swelling   Shut down my kidneys    Cefdinir Other (See Comments)   Pancreatitis   Cozaar [losartan Potassium] Other (See Comments)   dizziness   Nsaids Other (See Comments)   GI intolerance and pain all over, tolerates ibu   Zetia [ezetimibe] Swelling   Rofecoxib Other (See Comments)   "VIOXX"- "SHUT DOWN MY KIDNEYS"   Sulfa Antibiotics Nausea And Vomiting, Other (See Comments)   Pancreatitis      Medication List       Accurate as of Feb 17, 2020 11:34 AM. If you have any questions, ask your nurse or doctor.        STOP taking these medications   clopidogrel 75 MG tablet Commonly known as: Plavix  Stopped by: Worthy Rancher, MD   doxycycline 100 MG tablet Commonly known as: ADOXA Stopped by: Fransisca Kaufmann Ayodeji Keimig, MD     TAKE these medications   aspirin EC 81 MG tablet Take 1 tablet (81 mg total) by mouth daily.   B-D UF III MINI PEN NEEDLES 31G X 5 MM Misc Generic drug: Insulin Pen Needle 1 each by Does not apply route 4 (four) times daily.   blood glucose meter kit and supplies Use up to four times daily as directed   digoxin 0.125 MG tablet Commonly known as: LANOXIN Take 0.5 tablets (0.0625 mg total) by mouth daily.   empagliflozin 10  MG Tabs tablet Commonly known as: JARDIANCE Take 10 mg by mouth daily before breakfast.   Fluticasone-Salmeterol 100-50 MCG/DOSE Aepb Commonly known as: ADVAIR Inhale 1 puff into the lungs 2 (two) times daily.   furosemide 20 MG tablet Commonly known as: LASIX Take 2 tablets (40 mg total) by mouth daily. You may take an extra dose for increase weight gain or swelling   glimepiride 2 MG tablet Commonly known as: AMARYL Take 1-2 tablets (2-4 mg total) by mouth daily with breakfast.   glucose blood test strip Commonly known as: OneTouch Verio USE TO CHECK BLOOD GLUCOSE TWICE DAILY AND AS NEEDED   ibuprofen 200 MG tablet Commonly known as: ADVIL Take 400 mg by mouth 2 (two) times daily as needed (for pain).   metoprolol succinate 25 MG 24 hr tablet Commonly known as: TOPROL-XL Take 1 tablet (25 mg total) by mouth daily. Take 1 tablet By Mouth daily What changed: additional instructions   OneTouch Delica Lancets 62I Misc USE   TO CHECK GLUCOSE TWICE DAILY AND AS NEEDED   Tyler Aas FlexTouch 100 UNIT/ML FlexTouch Pen Generic drug: insulin degludec Inject 0.1 mLs (10 Units total) into the skin daily. Started by: Worthy Rancher, MD   Vitamin D 50 MCG (2000 UT) tablet Take 4,000-6,000 Units by mouth daily.        Objective:   BP 124/72   Pulse 91   Temp 98.2 F (36.8 C) (Temporal)   Ht 5' (1.524 m)   Wt 110 lb (49.9 kg)   SpO2 97%   BMI 21.48 kg/m   Wt Readings from Last 3 Encounters:  02/17/20 110 lb (49.9 kg)  02/03/20 109 lb (49.4 kg)  01/15/20 110 lb 9.6 oz (50.2 kg)    Physical Exam Vitals and nursing note reviewed.  Constitutional:      General: She is not in acute distress.    Appearance: She is well-developed. She is not diaphoretic.  Eyes:     Conjunctiva/sclera: Conjunctivae normal.  Cardiovascular:     Rate and Rhythm: Normal rate and regular rhythm.     Heart sounds: Normal heart sounds. No murmur.  Pulmonary:     Effort: Pulmonary effort is  normal. No respiratory distress.     Breath sounds: Normal breath sounds. No wheezing.  Musculoskeletal:        General: No tenderness. Normal range of motion.  Skin:    General: Skin is warm and dry.     Findings: No rash.  Neurological:     Mental Status: She is alert and oriented to person, place, and time.     Coordination: Coordination normal.  Psychiatric:        Behavior: Behavior normal.       Assessment & Plan:   Problem List Items Addressed This Visit  Respiratory   COPD GOLD II / active smoker     Endocrine   Hyperlipidemia associated with type 2 diabetes mellitus (HCC)   Relevant Medications   insulin degludec (TRESIBA FLEXTOUCH) 100 UNIT/ML FlexTouch Pen   Type 2 diabetes mellitus (HCC) - Primary   Relevant Medications   insulin degludec (TRESIBA FLEXTOUCH) 100 UNIT/ML FlexTouch Pen   Other Relevant Orders   Bayer DCA Hb A1c Waived     Other   Vitamin D deficiency      Will add [place] that she can use when her blood sugars are up or when she gets steroid injections.  Recommended starting at 10 units daily Follow up plan: Return in about 3 months (around 05/19/2020), or if symptoms worsen or fail to improve, for Diabetes recheck.  Counseling provided for all of the vaccine components Orders Placed This Encounter  Procedures  . Bayer [facility] Hb A1c [place], MD [facility] 02/17/2020, 11:34 AM

## 2020-02-18 ENCOUNTER — Other Ambulatory Visit: Payer: Self-pay | Admitting: Family Medicine

## 2020-02-18 ENCOUNTER — Telehealth: Payer: Self-pay | Admitting: Family Medicine

## 2020-02-18 DIAGNOSIS — S39012A Strain of muscle, fascia and tendon of lower back, initial encounter: Secondary | ICD-10-CM

## 2020-02-18 LAB — CBC WITH DIFFERENTIAL/PLATELET
Basophils Absolute: 0.1 10*3/uL (ref 0.0–0.2)
Basos: 1 %
EOS (ABSOLUTE): 0.2 10*3/uL (ref 0.0–0.4)
Eos: 2 %
Hematocrit: 33.2 % — ABNORMAL LOW (ref 34.0–46.6)
Hemoglobin: 11.1 g/dL (ref 11.1–15.9)
Immature Grans (Abs): 0 10*3/uL (ref 0.0–0.1)
Immature Granulocytes: 0 %
Lymphocytes Absolute: 1.5 10*3/uL (ref 0.7–3.1)
Lymphs: 15 %
MCH: 33.4 pg — ABNORMAL HIGH (ref 26.6–33.0)
MCHC: 33.4 g/dL (ref 31.5–35.7)
MCV: 100 fL — ABNORMAL HIGH (ref 79–97)
Monocytes Absolute: 0.6 10*3/uL (ref 0.1–0.9)
Monocytes: 6 %
Neutrophils Absolute: 7.8 10*3/uL — ABNORMAL HIGH (ref 1.4–7.0)
Neutrophils: 76 %
Platelets: 278 10*3/uL (ref 150–450)
RBC: 3.32 x10E6/uL — ABNORMAL LOW (ref 3.77–5.28)
RDW: 15.7 % — ABNORMAL HIGH (ref 11.7–15.4)
WBC: 10.3 10*3/uL (ref 3.4–10.8)

## 2020-02-18 LAB — CMP14+EGFR
ALT: 9 IU/L (ref 0–32)
AST: 16 IU/L (ref 0–40)
Albumin/Globulin Ratio: 1.3 (ref 1.2–2.2)
Albumin: 3.9 g/dL (ref 3.7–4.7)
Alkaline Phosphatase: 61 IU/L (ref 48–121)
BUN/Creatinine Ratio: 18 (ref 12–28)
BUN: 29 mg/dL — ABNORMAL HIGH (ref 8–27)
Bilirubin Total: 0.8 mg/dL (ref 0.0–1.2)
CO2: 27 mmol/L (ref 20–29)
Calcium: 9.2 mg/dL (ref 8.7–10.3)
Chloride: 97 mmol/L (ref 96–106)
Creatinine, Ser: 1.62 mg/dL — ABNORMAL HIGH (ref 0.57–1.00)
GFR calc Af Amer: 35 mL/min/{1.73_m2} — ABNORMAL LOW (ref >59)
GFR calc non Af Amer: 30 mL/min/{1.73_m2} — ABNORMAL LOW (ref >59)
Globulin, Total: 3 g/dL (ref 1.5–4.5)
Glucose: 176 mg/dL — ABNORMAL HIGH (ref 65–99)
Potassium: 4.2 mmol/L (ref 3.5–5.2)
Sodium: 138 mmol/L (ref 134–144)
Total Protein: 6.9 g/dL (ref 6.0–8.5)

## 2020-02-18 LAB — VITAMIN D 25 HYDROXY (VIT D DEFICIENCY, FRACTURES): Vit D, 25-Hydroxy: 103 ng/mL — ABNORMAL HIGH (ref 30.0–100.0)

## 2020-02-18 LAB — LIPID PANEL
Chol/HDL Ratio: 3.8 ratio (ref 0.0–4.4)
Cholesterol, Total: 157 mg/dL (ref 100–199)
HDL: 41 mg/dL (ref >39)
LDL Chol Calc (NIH): 93 mg/dL (ref 0–99)
Triglycerides: 129 mg/dL (ref 0–149)
VLDL Cholesterol Cal: 23 mg/dL (ref 5–40)

## 2020-02-18 NOTE — Telephone Encounter (Signed)
  Incoming Patient Call  02/18/2020  What symptoms do you have? Neck pain   How long have you been sick? This started yesterday afternoon after her nap  Have you been seen for this problem? No she wants a muscle relaxer  If your provider decides to give you a prescription, which pharmacy would you like for it to be sent to? [facility]   Patient informed that this information will be sent to the clinical staff for review and that they should receive a follow up call.

## 2020-02-18 NOTE — Telephone Encounter (Signed)
Patient had visit yesterday. Do you want her to have another visit for this? No appointments available today. Please advise

## 2020-02-20 MED ORDER — CYCLOBENZAPRINE HCL 10 MG PO TABS
10.0000 mg | ORAL_TABLET | Freq: Three times a day (TID) | ORAL | 0 refills | Status: DC | PRN
Start: 2020-02-20 — End: 2020-07-21

## 2020-02-20 NOTE — Telephone Encounter (Signed)
Aware of new medicine. 

## 2020-02-20 NOTE — Telephone Encounter (Signed)
Sent Flexeril for the patient

## 2020-02-24 DIAGNOSIS — M25562 Pain in left knee: Secondary | ICD-10-CM | POA: Diagnosis not present

## 2020-02-24 DIAGNOSIS — M25561 Pain in right knee: Secondary | ICD-10-CM | POA: Diagnosis not present

## 2020-03-03 ENCOUNTER — Telehealth (HOSPITAL_COMMUNITY): Payer: Self-pay | Admitting: Cardiology

## 2020-03-03 NOTE — Telephone Encounter (Signed)
Patients daughter called to report increase in weight with SOB Normal weight 108 today weight 112 Mildly more SOB Denied increase in fluid intake and salt consumption Compliant with medications    Reviewed last OV and per Dr Haroldine Laws Patient may take an additional 20 mg of lasix as needed for SOB/weight gain    Patients daughter aware and voiced understanding Advised to take additional 20 mg for the next 2-3 days as needed until weight returns to normal/stable weights May call 03/05/2020 with update

## 2020-03-15 ENCOUNTER — Other Ambulatory Visit (HOSPITAL_COMMUNITY): Payer: Self-pay | Admitting: *Deleted

## 2020-03-15 MED ORDER — METOPROLOL SUCCINATE ER 25 MG PO TB24: 25.0000 mg | ORAL_TABLET | Freq: Every day | ORAL | 3 refills | Status: DC

## 2020-03-16 ENCOUNTER — Other Ambulatory Visit (HOSPITAL_COMMUNITY): Payer: Self-pay | Admitting: *Deleted

## 2020-03-18 ENCOUNTER — Telehealth: Payer: Self-pay | Admitting: Cardiovascular Disease

## 2020-03-18 NOTE — Telephone Encounter (Signed)
Ms. Casstevens is not her sister's POA and is not there with her sister to have permission to look at her chart. She states her sister had a stroke and needs to establish with a cardiologist. Instructed Ms. Tramontana to have her niece (the potential patient's daughter and POA) call and establish the patient with a general cardiologist. She was grateful for assistance.

## 2020-03-18 NOTE — Telephone Encounter (Signed)
Patient calling requesting Hannah French to call her about getting her sister established with Dr. Burt Knack.

## 2020-03-18 NOTE — Telephone Encounter (Signed)
Left message to call back  

## 2020-03-29 ENCOUNTER — Other Ambulatory Visit: Payer: Self-pay | Admitting: Family Medicine

## 2020-03-29 DIAGNOSIS — E1169 Type 2 diabetes mellitus with other specified complication: Secondary | ICD-10-CM

## 2020-04-06 ENCOUNTER — Telehealth (HOSPITAL_COMMUNITY): Payer: Self-pay | Admitting: *Deleted

## 2020-04-06 NOTE — Telephone Encounter (Signed)
Pt left vm requesting sooner appointment. I called pt back to get more information. No answer/left vm requesting return call.

## 2020-05-18 ENCOUNTER — Ambulatory Visit (HOSPITAL_BASED_OUTPATIENT_CLINIC_OR_DEPARTMENT_OTHER)
Admission: RE | Admit: 2020-05-18 | Discharge: 2020-05-18 | Disposition: A | Discharge status: Home / Self Care | Payer: Medicare Other | Source: Ambulatory Visit | Attending: Internal Medicine | Admitting: Internal Medicine

## 2020-05-18 ENCOUNTER — Other Ambulatory Visit: Payer: Self-pay

## 2020-05-18 ENCOUNTER — Ambulatory Visit (HOSPITAL_COMMUNITY)
Admission: RE | Admit: 2020-05-18 | Discharge: 2020-05-18 | Disposition: A | Discharge status: Home / Self Care | Payer: Medicare Other | Source: Ambulatory Visit | Attending: Internal Medicine | Admitting: Internal Medicine

## 2020-05-18 VITALS — BP 105/60 | HR 84 | Wt 111.8 lb

## 2020-05-18 DIAGNOSIS — Z7951 Long term (current) use of inhaled steroids: Secondary | ICD-10-CM | POA: Diagnosis not present

## 2020-05-18 DIAGNOSIS — E1151 Type 2 diabetes mellitus with diabetic peripheral angiopathy without gangrene: Secondary | ICD-10-CM | POA: Insufficient documentation

## 2020-05-18 DIAGNOSIS — Z791 Long term (current) use of non-steroidal anti-inflammatories (NSAID): Secondary | ICD-10-CM | POA: Insufficient documentation

## 2020-05-18 DIAGNOSIS — Z794 Long term (current) use of insulin: Secondary | ICD-10-CM | POA: Diagnosis not present

## 2020-05-18 DIAGNOSIS — Z7982 Long term (current) use of aspirin: Secondary | ICD-10-CM | POA: Insufficient documentation

## 2020-05-18 DIAGNOSIS — I272 Pulmonary hypertension, unspecified: Secondary | ICD-10-CM | POA: Insufficient documentation

## 2020-05-18 DIAGNOSIS — I5042 Chronic combined systolic (congestive) and diastolic (congestive) heart failure: Secondary | ICD-10-CM | POA: Diagnosis not present

## 2020-05-18 DIAGNOSIS — Z886 Allergy status to analgesic agent status: Secondary | ICD-10-CM | POA: Insufficient documentation

## 2020-05-18 DIAGNOSIS — I341 Nonrheumatic mitral (valve) prolapse: Secondary | ICD-10-CM | POA: Insufficient documentation

## 2020-05-18 DIAGNOSIS — E785 Hyperlipidemia, unspecified: Secondary | ICD-10-CM | POA: Insufficient documentation

## 2020-05-18 DIAGNOSIS — N182 Chronic kidney disease, stage 2 (mild): Secondary | ICD-10-CM | POA: Insufficient documentation

## 2020-05-18 DIAGNOSIS — Z720 Tobacco use: Secondary | ICD-10-CM | POA: Diagnosis not present

## 2020-05-18 DIAGNOSIS — Z8249 Family history of ischemic heart disease and other diseases of the circulatory system: Secondary | ICD-10-CM | POA: Insufficient documentation

## 2020-05-18 DIAGNOSIS — F17210 Nicotine dependence, cigarettes, uncomplicated: Secondary | ICD-10-CM | POA: Insufficient documentation

## 2020-05-18 DIAGNOSIS — Z8719 Personal history of other diseases of the digestive system: Secondary | ICD-10-CM | POA: Diagnosis not present

## 2020-05-18 DIAGNOSIS — I447 Left bundle-branch block, unspecified: Secondary | ICD-10-CM | POA: Diagnosis not present

## 2020-05-18 DIAGNOSIS — I5022 Chronic systolic (congestive) heart failure: Secondary | ICD-10-CM | POA: Diagnosis not present

## 2020-05-18 DIAGNOSIS — E1122 Type 2 diabetes mellitus with diabetic chronic kidney disease: Secondary | ICD-10-CM | POA: Diagnosis not present

## 2020-05-18 DIAGNOSIS — Z9049 Acquired absence of other specified parts of digestive tract: Secondary | ICD-10-CM | POA: Insufficient documentation

## 2020-05-18 DIAGNOSIS — I428 Other cardiomyopathies: Secondary | ICD-10-CM | POA: Insufficient documentation

## 2020-05-18 DIAGNOSIS — Z79899 Other long term (current) drug therapy: Secondary | ICD-10-CM | POA: Insufficient documentation

## 2020-05-18 DIAGNOSIS — R1031 Right lower quadrant pain: Secondary | ICD-10-CM

## 2020-05-18 DIAGNOSIS — I739 Peripheral vascular disease, unspecified: Secondary | ICD-10-CM

## 2020-05-18 DIAGNOSIS — I771 Stricture of artery: Secondary | ICD-10-CM | POA: Diagnosis not present

## 2020-05-18 DIAGNOSIS — Z882 Allergy status to sulfonamides status: Secondary | ICD-10-CM | POA: Insufficient documentation

## 2020-05-18 DIAGNOSIS — Z881 Allergy status to other antibiotic agents status: Secondary | ICD-10-CM | POA: Diagnosis not present

## 2020-05-18 DIAGNOSIS — Z888 Allergy status to other drugs, medicaments and biological substances status: Secondary | ICD-10-CM | POA: Insufficient documentation

## 2020-05-18 DIAGNOSIS — Z8551 Personal history of malignant neoplasm of bladder: Secondary | ICD-10-CM | POA: Insufficient documentation

## 2020-05-18 DIAGNOSIS — J449 Chronic obstructive pulmonary disease, unspecified: Secondary | ICD-10-CM | POA: Insufficient documentation

## 2020-05-18 DIAGNOSIS — I2510 Atherosclerotic heart disease of native coronary artery without angina pectoris: Secondary | ICD-10-CM

## 2020-05-18 NOTE — Progress Notes (Signed)
Right groin pseudoaneurysm evaluation has been completed. Preliminary results can be found in CV Proc through chart review.   05/18/20 3:19 PM Hannah French RVT

## 2020-05-18 NOTE — Progress Notes (Signed)
ADVANCED HF CLINIC NOTE  Date:  05/18/2020   ID:  Manda Holstad, DOB August 23, 1941, MRN [id number]  PCP:  Dettinger, Fransisca Kaufmann, MD  Cardiologist:  No primary care provider on file.  Electrophysiologist:  None   Referring Provider: Melina Copa PA-C  Chief Complaint:  HF  History of Present Illness:   Hannah French is a 79 y.o. 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   She was seen in the office on 08/26/2018 by Dr. Burt Knack and was noted to have shortness of breath felt to be related to COPD and URI. She did have a progression on her EKG from previous IVCD to left bundle branch block. An echo was done which showed a decline in LV function with EF 30-35% with diffuse hypokinesis (prior was normal). With new LBBB and significant change in LV function she was sent for right and left heart cath which was done on 09/16/2018 which showed nonobstructive CAD with diffuse luminal irregularities involving the left main, left circumflex and RCA. There was 40-50% calcific stenosis of the proximal LAD. There was mild pulmonary hypertension and mild elevation in LVEDP with preserved cardiac output. Medical therapy was recommended.. She is not able to take statin drugs or aspirin because of intolerance to both medicines. She has stated that she is a "free bleeder" and cannot tolerate any antiplatelet therapy.   Her medical therapy has been limited by low BP and dizziness (beta blocker reduced, losartan stopped). Corlanor was started for elevated HR and soft BP but not yet approved, F/u echo 04/28/19 showed EF 25-30%, diffuse HK, Grade II DD,  mildly dilated LA, mild MR.   I saw her  for the first time in the HF Clinic in 8/20. Was doing fairly well. Felt SOB mostly due to COPD. Hall walk with sats 93-96%. Unable to titrate HF meds due to low BP and dizziness  Underwent stenting of bilateral common iliac artery stent placement extending into the distal aorta on 12/31/19  Returns for f/u. Over past few weeks has been having pain and swelling in the right groin. No CP. Chronically SOB and exhausted. No claudication. No LE wounds.   PFTs 06/11/19 FEV1 0.96L (55%) FVC 1.61L (68%) Ratio60% FEF25-75  0.40 (29%) DLCO 50%   Past Medical History:  Diagnosis Date  . Abdominal bruit   . Bladder cancer ([place])   . CAD (coronary artery disease)    a. nonobstructive by cath 08/2018.  . Carotid bruit   . Chronic combined systolic and diastolic CHF (congestive heart failure) ([place])   . CKD (chronic kidney disease), stage II   . Diabetes mellitus    Type II  . Dyslipidemia   . Heart murmur   . Mild pulmonary hypertension ([place])   . Mitral valve prolapse    a. not seen on most recent echoes. Mild MR now.  Marland Kitchen NICM (nonischemic cardiomyopathy) ([place])   . Osteoporosis   . Pancreatitis, acute   . Pseudogout   . PVD (peripheral vascular disease) ([place])    a. 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  . Sinus tachycardia   . Tobacco abuse     Past Surgical History:  Procedure Laterality Date  .  ABDOMINAL AORTOGRAM W/LOWER EXTREMITY N/A 12/31/2019   Procedure: ABDOMINAL AORTOGRAM W/LOWER EXTREMITY;  Surgeon: Wellington Hampshire, MD;  Location: [facility];  Service: Cardiovascular;  Laterality: N/A;  . APPENDECTOMY    . Hysterectomy-type unspecified    . LAPAROSCOPIC CHOLECYSTECTOMY  2009  . PERIPHERAL VASCULAR INTERVENTION  12/31/2019   Procedure: PERIPHERAL VASCULAR INTERVENTION;  Surgeon: Wellington Hampshire, MD;  Location: [facility];  Service: Cardiovascular;;  Bilateral Iliacs  . RIGHT/LEFT HEART CATH AND CORONARY  ANGIOGRAPHY N/A 09/16/2018   Procedure: RIGHT/LEFT HEART CATH AND CORONARY ANGIOGRAPHY;  Surgeon: Sherren Mocha, MD;  Location: [facility];  Service: Cardiovascular;  Laterality: N/A;    Current Medications: Current Meds  Medication Sig  . aspirin EC 81 MG tablet Take 1 tablet (81 mg total) by mouth daily.  . blood glucose meter kit and supplies Use up to four times daily as directed  . Cholecalciferol (VITAMIN D) 50 MCG (2000 UT) tablet Take 4,000-6,000 Units by mouth daily.   . cyclobenzaprine (FLEXERIL) 10 MG tablet Take 1 tablet (10 mg total) by mouth 3 (three) times daily as needed for muscle spasms.  . digoxin (LANOXIN) 0.125 MG tablet Take 0.5 tablets (0.0625 mg total) by mouth daily.  . empagliflozin (JARDIANCE) 10 MG TABS tablet Take 10 mg by mouth daily before breakfast.  . Fluticasone-Salmeterol (ADVAIR) 100-50 MCG/DOSE AEPB Inhale 1 puff into the lungs 2 (two) times daily.  . furosemide (LASIX) 20 MG tablet Take 2 tablets (40 mg total) by mouth daily. You may take an extra dose for increase weight gain or swelling  . glimepiride (AMARYL) 2 MG tablet Take 1-2 tablets (2-4 mg total) by mouth daily with breakfast.  . glucose blood (ONETOUCH VERIO) test strip TEst BS BID and as needed Dx E11.9  . ibuprofen (ADVIL,MOTRIN) 200 MG tablet Take 400 mg by mouth 2 (two) times daily as needed (for pain).   . insulin degludec (TRESIBA FLEXTOUCH) 100 UNIT/ML FlexTouch Pen Inject 0.1 mLs (10 Units total) into the skin daily.  . Insulin Pen Needle (B-D UF III MINI PEN NEEDLES) 31G X 5 MM MISC 1 each by Does not apply route 4 (four) times daily.  . metoprolol succinate (TOPROL-XL) 25 MG 24 hr tablet Take 1 tablet (25 mg total) by mouth daily.  Glory Rosebush DELICA LANCETS 34H MISC USE   TO CHECK GLUCOSE TWICE DAILY AND AS NEEDED     Allergies:   Contrast media [iodinated diagnostic agents], Iohexol, Metformin, Statins, Albuterol, Bextra [valdecoxib], Cefdinir, Cozaar [losartan potassium],  Nsaids, Zetia [ezetimibe], Rofecoxib, and Sulfa antibiotics   Social History   Socioeconomic History  . Marital status: Divorced    Spouse name: Not on file  . Number of children: 2  . Years of education: Not on file  . Highest education level: Not on file  Occupational History  . Occupation: Retired  Tobacco Use  . Smoking status: Current Every Day Smoker    Packs/day: 1.00    Years: 50.00    Pack years: 50.00    Types: Cigarettes  . Smokeless tobacco: Never Used  Vaping Use  . Vaping Use: Never used  Substance and Sexual Activity  . Alcohol use: No  . Drug use: No  . Sexual activity: Not on file  Other Topics Concern  . Not on file  Social History Narrative  . Not on file   Social Determinants of Health   Financial Resource Strain:   . Difficulty of Paying Living  Expenses: Not on file  Food Insecurity:   . Worried About Charity fundraiser in the Last Year: Not on file  . Ran Out of Food in the Last Year: Not on file  Transportation Needs:   . Lack of Transportation (Medical): Not on file  . Lack of Transportation (Non-Medical): Not on file  Physical Activity:   . Days of Exercise per Week: Not on file  . Minutes of Exercise per Session: Not on file  Stress:   . Feeling of Stress : Not on file  Social Connections:   . Frequency of Communication with Friends and Family: Not on file  . Frequency of Social Gatherings with Friends and Family: Not on file  . Attends Religious Services: Not on file  . Active Member of Clubs or Organizations: Not on file  . Attends Archivist Meetings: Not on file  . Marital Status: Not on file     Family History:  [name] family history includes Heart attack in her brother and maternal uncle; Heart failure in her mother; Hypertension in her brother and sister. There is no history of Coronary artery disease.   EKGs/Labs/Other Studies Reviewed:    Studies reviewed were summarized above.   EKG:  EKG 05/14/19,  personally reviewed, demonstrating NSR 94bpm with known LBBB (QRS 14m)  similar to prior  Recent Labs: 11/19/2019: B Natriuretic Peptide 153.4 02/17/2020: ALT 9; BUN 29; Creatinine, Ser 1.62; Hemoglobin 11.1; Platelets 278; Potassium 4.2; Sodium 138  Recent Lipid Panel    Component Value Date/Time   CHOL 157 02/17/2020 1159   TRIG 129 02/17/2020 1159   TRIG 82 06/09/2014 1028   HDL 41 02/17/2020 1159   HDL 57 06/09/2014 1028   CHOLHDL 3.8 02/17/2020 1159   CHOLHDL 3.5 02/06/2013 0932   VLDL 20 02/06/2013 0932   LDLCALC 93 02/17/2020 1159   LDLCALC 131 (H) 06/09/2014 1028    PHYSICAL EXAM:    VS:  BP 105/60   Pulse 84   Wt 50.7 kg (111 lb 12.8 oz)   SpO2 96%   BMI 21.83 kg/m   BMI: Body mass index is 21.83 kg/m.  General:  Elderly weak appearing. Severe HOH SOB with minimal activity HEENT: normal Neck: supple. no JVD. Carotids 2+ bilat; no bruits. No lymphadenopathy or thryomegaly appreciated. Cor: PMI nondisplaced. Regular rate & rhythm. Distant HS Lungs: severely decreased throughout Abdomen: soft, nontender, nondistended. No hepatosplenomegaly. No bruits or masses. Good bowel sounds. Extremities: no cyanosis, clubbing, rash, edema R groin. No obvious deformityfemoral pulse 2+ no bruit DPs non-palpable Neuro: alert & oriented x 3, cranial nerves grossly intact. moves all 4 extremities w/o difficulty. Affect pleasant    Wt Readings from Last 3 Encounters:  05/18/20 50.7 kg (111 lb 12.8 oz)  02/17/20 49.9 kg (110 lb)  02/03/20 49.4 kg (109 lb)     ASSESSMENT & PLAN:   1. Chronic systolic HF  - due to NICM (? LBBB but not very wide)  - Echo 12/19 EF 30-35% with diffuse hypokinesis - Cath 12/19 nonobstructive CAD - Echo  8/20 EF 25-30% with Grade II DD - Stable NYHA IIIB compounded by severe PAD and end-stage COPD - Volume status looks good on lasix 40 daily. Will continue - Intolerant of meds due to low BP and dizziness (She does have a BP differential between  arms so should likely be using the right arm to obtain accurate readings) - Continue Toprol 25 daily - Continue digoxin 0.125 daily -  Continue Jardiance - Losartan stopped due to low BP and hyperkalemia - Corlanor ordered but did not get approved - PFTs quite severe and likely major limiting factor (with PAD) - Not interested in ICD or CRT. (cancelled appt with EP) - Repeat echo at next visit  2. PAD with left subclavian artery stenosis and severe iliac disease - has f/u with Dr. Fletcher Anon - she is s/p stenting of bilateral common iliac artery stent placement extending into the distal aorta.  - c/o R groin pain and swelling. Will get u/s today to f/u on site  3. CAD - nonobstructive by prior cath. Refuses statins and ASA - no s/s of ischemia   4. COPD with ongoing tobacco use - still smoking 3/4 ppd, she is unable quit - PFTs 9/20 with severe COPD  5. DM2 - continue Jardiance   Signed, Glori Bickers, MD  05/18/2020 2:20 PM    [facility] [place], [place], [place]  [address] Phone: [phone]; Fax: [phone]

## 2020-05-18 NOTE — Patient Instructions (Signed)
Please call our office in February to schedule your follow up appointment.   If you have any questions or concerns before your next appointment please send [place] a message through [place] or call our office at [phone].    TO LEAVE A MESSAGE FOR THE NURSE SELECT OPTION 2, PLEASE LEAVE A MESSAGE INCLUDING: . YOUR NAME . DATE OF BIRTH . CALL BACK NUMBER . REASON FOR CALL**this is important as we prioritize the call backs  [place] AS LONG AS YOU CALL BEFORE 4:00 PM   At the [facility], you and your health needs are our priority. As part of our continuing mission to provide you with exceptional heart care, we have created designated Provider Care Teams. These Care Teams include your primary Cardiologist (physician) and Advanced Practice Providers (APPs- Physician Assistants and Nurse Practitioners) who all work together to provide you with the care you need, when you need it.   You may see any of the following providers on your designated Care Team at your next follow up: Marland Kitchen Dr Glori Bickers . Dr Loralie Champagne . Darrick Grinder, NP . Lyda Jester, PA . Audry Riles, PharmD   Please be sure to bring in all your medications bottles to every appointment.

## 2020-06-01 ENCOUNTER — Ambulatory Visit: Payer: Medicare Other | Admitting: Cardiovascular Disease

## 2020-06-07 ENCOUNTER — Telehealth: Payer: Self-pay | Admitting: Family Medicine

## 2020-06-07 NOTE — Telephone Encounter (Signed)
She could qualify and have it, she can go ahead and get it if she wants, I do think it would be good for her because of her history.  As long as it is been more than 30 days since her last vaccine dose

## 2020-06-07 NOTE — Telephone Encounter (Signed)
Patient aware and scheduled for booster

## 2020-06-07 NOTE — Telephone Encounter (Signed)
Patient is not immunocompromised but she does have a history of heart failure and COPD and is wondering if you recommend she have a booster vaccine for Covid.  Please advise and route to pools.

## 2020-06-16 ENCOUNTER — Ambulatory Visit: Payer: Medicare Other

## 2020-06-16 ENCOUNTER — Other Ambulatory Visit: Payer: Self-pay

## 2020-06-16 ENCOUNTER — Ambulatory Visit (INDEPENDENT_AMBULATORY_CARE_PROVIDER_SITE_OTHER): Payer: Medicare Other

## 2020-06-16 DIAGNOSIS — Z23 Encounter for immunization: Secondary | ICD-10-CM | POA: Diagnosis not present

## 2020-06-16 NOTE — Progress Notes (Signed)
   Covid-19 Vaccination Clinic  Name:  Ryhanna Dunsmore    MRN: [id number] DOB: 1940-10-26  06/16/2020  Ms. Braunschweig was observed post Covid-19 immunization for 15 minutes without incident. She was provided with Vaccine Information Sheet and instruction to access the V-Safe system.   Ms. Mabie was instructed to call 911 with any severe reactions post vaccine: Marland Kitchen Difficulty breathing  . Swelling of face and throat  . A fast heartbeat  . A bad rash all over body  . Dizziness and weakness

## 2020-06-18 ENCOUNTER — Other Ambulatory Visit: Payer: Self-pay | Admitting: Family Medicine

## 2020-06-21 ENCOUNTER — Other Ambulatory Visit: Payer: Self-pay | Admitting: Family Medicine

## 2020-06-22 ENCOUNTER — Ambulatory Visit: Payer: Medicare Other | Admitting: Cardiovascular Disease

## 2020-06-22 ENCOUNTER — Other Ambulatory Visit: Payer: Self-pay

## 2020-06-22 ENCOUNTER — Encounter: Payer: Self-pay | Admitting: Cardiovascular Disease

## 2020-06-22 VITALS — BP 94/64 | HR 84 | Ht 60.0 in | Wt 111.0 lb

## 2020-06-22 DIAGNOSIS — I5022 Chronic systolic (congestive) heart failure: Secondary | ICD-10-CM | POA: Diagnosis not present

## 2020-06-22 DIAGNOSIS — Z720 Tobacco use: Secondary | ICD-10-CM | POA: Diagnosis not present

## 2020-06-22 DIAGNOSIS — I739 Peripheral vascular disease, unspecified: Secondary | ICD-10-CM | POA: Diagnosis not present

## 2020-06-22 DIAGNOSIS — E785 Hyperlipidemia, unspecified: Secondary | ICD-10-CM

## 2020-06-22 NOTE — Patient Instructions (Signed)
Medication Instructions:  Your physician recommends that you continue on your current medications as directed. Please refer to the Current Medication list given to you today.  *If you need a refill on your cardiac medications before your next appointment, please call your pharmacy*   Lab Work: NONE   Testing/Procedures: Your physician has requested that you have an ankle brachial index (ABI). During this test an ultrasound and blood pressure cuff are used to evaluate the arteries that supply the arms and legs with blood. Allow thirty minutes for this exam. There are no restrictions or special instructions.  AORTA/ILIAC ULTRA SOUND    Follow-Up: At [facility], you and your health needs are our priority.  As part of our continuing mission to provide you with exceptional heart care, we have created designated Provider Care Teams.  These Care Teams include your primary Cardiologist (physician) and Advanced Practice Providers (APPs -  Physician Assistants and Nurse Practitioners) who all work together to provide you with the care you need, when you need it.  We recommend signing up for the patient portal called "MyChart".  Sign up information is provided on this After Visit Summary.  MyChart is used to connect with patients for Virtual Visits (Telemedicine).  Patients are able to view lab/test results, encounter notes, upcoming appointments, etc.  Non-urgent messages can be sent to your provider as well.   To learn more about what you can do with MyChart, go to [email or web address].    Your next appointment:   6 month(s)  The format for your next appointment:   In Person  Provider:   Kathlyn Sacramento, MD

## 2020-06-22 NOTE — Progress Notes (Signed)
Cardiology Office Note   Date:  06/22/2020   ID:  Hannah French, DOB July 31, 1941, MRN [id number]  PCP:  Dettinger, Fransisca Kaufmann, MD  Cardiologist:  Dr. Burt Knack  No chief complaint on file.     History of Present Illness: Hannah French is a 79 y.o. female who is here today for follow-up visit regarding peripheral arterial disease.  The patient has known history of peripheral arterial disease with  severe bilateral iliac stenosis and asymptomatic left subclavian artery stenosis. She has prolonged history of tobacco use and diabetes.  Other medical problems include chronic systolic heart failure, COPD, bladder cancer, hyperlipidemia, pancreatitis and pseudogout.  Most recent vascular studies in August of 2020 showed significantly elevated velocities in bilateral common iliac arteries with normal ABI. Carotid Doppler in August of this year showed moderate bilateral carotid stenosis with atypical flow in the left vertebral artery with possible right subclavian artery stenosis.  Right vertebral artery had antegrade flow.  The patient had significant bilateral leg claudication and ultimately I proceeded with angiography in April which showed significant stenosis affecting the distal aorta above the iliac bifurcation extending into both iliac arteries.  I performed successful bilateral common iliac artery kissing stent placement extending into the distal aorta.  The procedure was extremely difficult due to the patient's continuous involuntary movements of her legs.  Bilateral Mynx closure device was used.  The patient developed some redness and tenderness in the right groin after she had her Doppler study done.  There was a concern about infection and the patient was treated with doxycycline.  Postprocedure duplex showed patent iliac stents with normal velocities.    She started having recent right groin discomfort.  She was seen by Dr. Haroldine Laws and scheduled an ultrasound to rule out  pseudoaneurysm which was negative.  She continues to have discomfort in the right groin but no significant calf claudication.  No lower extremity ulceration but her toes continue to be bluish.  She continues to smoke.  Past Medical History:  Diagnosis Date  . Abdominal bruit   . Bladder cancer ([place])   . CAD (coronary artery disease)    a. nonobstructive by cath 08/2018.  . Carotid bruit   . Chronic combined systolic and diastolic CHF (congestive heart failure) ([place])   . CKD (chronic kidney disease), stage II   . Diabetes mellitus    Type II  . Dyslipidemia   . Heart murmur   . Mild pulmonary hypertension ([place])   . Mitral valve prolapse    a. not seen on most recent echoes. Mild MR now.  Marland Kitchen NICM (nonischemic cardiomyopathy) ([place])   . Osteoporosis   . Pancreatitis, acute   . Pseudogout   . PVD (peripheral vascular disease) ([place])    a. 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  . Sinus tachycardia   . Tobacco abuse     Past Surgical History:  Procedure Laterality Date  . ABDOMINAL AORTOGRAM W/LOWER EXTREMITY N/A 12/31/2019   Procedure: ABDOMINAL AORTOGRAM W/LOWER EXTREMITY;  Surgeon: Wellington Hampshire, MD;  Location: [place] [facility];  Service: Cardiovascular;  Laterality: N/A;  . APPENDECTOMY    . Hysterectomy-type unspecified    . LAPAROSCOPIC CHOLECYSTECTOMY  2009  . PERIPHERAL VASCULAR INTERVENTION  12/31/2019   Procedure: PERIPHERAL VASCULAR INTERVENTION;  Surgeon: Wellington Hampshire, MD;  Location: [place] [facility];  Service: Cardiovascular;;  Bilateral Iliacs  . RIGHT/LEFT HEART CATH AND CORONARY ANGIOGRAPHY N/A 09/16/2018  Procedure: RIGHT/LEFT HEART CATH AND CORONARY ANGIOGRAPHY;  Surgeon: [place] Mocha, MD;  Location: [place] [facility];  Service: Cardiovascular;  Laterality: N/A;     Current Outpatient Medications  Medication Sig Dispense Refill  . aspirin EC 81 MG tablet Take 1 tablet (81 mg total) by mouth daily. 90  tablet 3  . blood glucose meter kit and supplies Use up to four times daily as directed 1 each 0  . Cholecalciferol (VITAMIN D) 50 MCG (2000 UT) tablet Take 4,000-6,000 Units by mouth daily.     . cyclobenzaprine (FLEXERIL) 10 MG tablet Take 1 tablet (10 mg total) by mouth 3 (three) times daily as needed for muscle spasms. 30 tablet 0  . digoxin (LANOXIN) 0.125 MG tablet Take 0.5 tablets (0.0625 mg total) by mouth daily. 45 tablet 3  . empagliflozin (JARDIANCE) 10 MG TABS tablet Take 10 mg by mouth daily before breakfast. 30 tablet 6  . Fluticasone-Salmeterol (ADVAIR) 100-50 MCG/DOSE AEPB Inhale 1 puff into the lungs 2 (two) times daily. 1 each 3  . furosemide (LASIX) 20 MG tablet Take 2 tablets daily. You may take an extra dose for increase weight gain orswelling 180 tablet 0  . glimepiride (AMARYL) 2 MG tablet Take 1-2 tablets (2-4 mg total) by mouth daily with breakfast. 180 tablet 3  . glucose blood (ONETOUCH VERIO) test strip TEst BS BID and as needed Dx E11.9 200 each 3  . ibuprofen (ADVIL,MOTRIN) 200 MG tablet Take 400 mg by mouth 2 (two) times daily as needed (for pain).     . insulin degludec (TRESIBA FLEXTOUCH) 100 UNIT/ML FlexTouch Pen Inject 0.1 mLs (10 Units total) into the skin daily. 9 mL 3  . Insulin Pen Needle (B-D UF III MINI PEN NEEDLES) 31G X 5 MM MISC 1 each by Does not apply route 4 (four) times daily. 120 each 11  . Lancets (ONETOUCH DELICA PLUS [id number]) MISC CHECK GLUCOSE UP TO 4 TIMES DAILY AS DIRECTED Dx E11.9 400 each 3  . metoprolol succinate (TOPROL-XL) 25 MG 24 hr tablet Take 1 tablet (25 mg total) by mouth daily. 90 tablet 3   No current facility-administered medications for this visit.    Allergies:   Contrast media [iodinated diagnostic agents], Iohexol, Metformin, Statins, Albuterol, Bextra [valdecoxib], Cefdinir, Cozaar [losartan potassium], Nsaids, Zetia [ezetimibe], Rofecoxib, and Sulfa antibiotics    Social History:  The patient  reports that she has been  smoking cigarettes. She has a 50.00 pack-year smoking history. She has never used smokeless tobacco. She reports that she does not drink alcohol and does not use drugs.   Family History:  [name] family history includes Heart attack in her brother and maternal uncle; Heart failure in her mother; Hypertension in her brother and sister.    ROS:  Please see the history of present illness.   Otherwise, review of systems are positive for none.   All other systems are reviewed and negative.    PHYSICAL EXAM: VS:  BP 94/64   Pulse 84   Ht 5' (1.524 m)   Wt 111 lb (50.3 kg)   SpO2 96%   BMI 21.68 kg/m  , BMI Body mass index is 21.68 kg/m. GEN: Well nourished, well developed, in no acute distress  HEENT: normal  Neck: no JVD, or masses. Bilateral carotid bruits Cardiac: RRR; no rubs, or gallops,no edema . 3/6 systolic ejection murmur in the aortic area which is mid peaking Respiratory:  clear to auscultation bilaterally, normal work of breathing GI:  soft, nontender, nondistended, + BS MS: no deformity or atrophy  Skin: warm and dry, no rash Neuro:  Strength and sensation are intact Psych: euthymic mood, full affect Vascular: Femoral pulses is +2 bilaterally.  Posterior tibial pulses palpable on the left side.  EKG:  EKG is ordered today. EKG shows normal sinus rhythm with left axis deviation and nonspecific IVCD.   Recent Labs: 11/19/2019: B Natriuretic Peptide 153.4 02/17/2020: ALT 9; BUN 29; Creatinine, Ser 1.62; Hemoglobin 11.1; Platelets 278; Potassium 4.2; Sodium 138    Lipid Panel    Component Value Date/Time   CHOL 157 02/17/2020 1159   TRIG 129 02/17/2020 1159   TRIG 82 06/09/2014 1028   HDL 41 02/17/2020 1159   HDL 57 06/09/2014 1028   CHOLHDL 3.8 02/17/2020 1159   CHOLHDL 3.5 02/06/2013 0932   VLDL 20 02/06/2013 0932   LDLCALC 93 02/17/2020 1159   LDLCALC 131 (H) 06/09/2014 1028      Wt Readings from Last 3 Encounters:  06/22/20 111 lb (50.3 kg)  05/18/20  111 lb 12.8 oz (50.7 kg)  02/17/20 110 lb (49.9 kg)       No flowsheet data found.    ASSESSMENT AND PLAN:  1.  Peripheral arterial disease: Status post bilateral common iliac artery stent placement extending into the distal aorta.  She reports significant discomfort in the right groin area.  Her femoral pulse is normal and recent ultrasound showed no evidence of pseudoaneurysm.  I do not think this is vascular in etiology and I asked her to follow-up with her primary care physician to look for alternative etiology. She is due for her repeat Doppler studies anyway and we are going to obtain an aortoiliac duplex and ABI.  2.  subclavian artery stenosis: I do not think this is critical.  She has a palpable radial pulse bilaterally and she denies arm claudication or symptoms of steal syndrome.  3.  Chronic systolic heart failure: Followed by Dr. Haroldine Laws .  She is on optimal medical therapy  4.  Hyperlipidemia: Intolerance to statins and Zetia.  5.  Tobacco use: Unfortunately, she is not able to quit.   Disposition:   FU with me in 6 months  Signed,  Kathlyn Sacramento, MD  06/22/2020 10:19 AM    [place]

## 2020-06-24 ENCOUNTER — Ambulatory Visit (INDEPENDENT_AMBULATORY_CARE_PROVIDER_SITE_OTHER): Payer: Medicare Other | Admitting: Family Medicine

## 2020-06-24 ENCOUNTER — Encounter: Payer: Self-pay | Admitting: Family Medicine

## 2020-06-24 ENCOUNTER — Other Ambulatory Visit: Payer: Self-pay

## 2020-06-24 VITALS — BP 127/68 | HR 83 | Temp 98.7°F | Ht 60.0 in | Wt 111.0 lb

## 2020-06-24 DIAGNOSIS — J449 Chronic obstructive pulmonary disease, unspecified: Secondary | ICD-10-CM | POA: Diagnosis not present

## 2020-06-24 DIAGNOSIS — E785 Hyperlipidemia, unspecified: Secondary | ICD-10-CM | POA: Diagnosis not present

## 2020-06-24 DIAGNOSIS — E1169 Type 2 diabetes mellitus with other specified complication: Secondary | ICD-10-CM

## 2020-06-24 LAB — BAYER DCA HB A1C WAIVED: HB A1C (BAYER DCA - WAIVED): 7.6 % — ABNORMAL HIGH (ref <7.0)

## 2020-06-24 MED ORDER — ALBUTEROL SULFATE HFA 108 (90 BASE) MCG/ACT IN AERS: 2.0000 | INHALATION_SPRAY | Freq: Four times a day (QID) | RESPIRATORY_TRACT | 5 refills | Status: DC | PRN

## 2020-06-24 MED ORDER — PREDNISONE 20 MG PO TABS: ORAL_TABLET | ORAL | 0 refills | Status: DC

## 2020-06-24 NOTE — Progress Notes (Signed)
BP 127/68   Pulse 83   Temp 98.7 F (37.1 C)   Ht 5' (1.524 m)   Wt 111 lb (50.3 kg)   BMI 21.68 kg/m    Subjective:   Patient ID: Hannah French, female    DOB: 1941-07-16, 79 y.o.   MRN: [id number]  HPI: Hannah French is a 79 y.o. female presenting on 06/24/2020 for Medical Management of Chronic Issues and Diabetes   HPI Type 2 diabetes mellitus Patient comes in today for recheck of his diabetes. Patient has been currently taking Jardiance and glimepiride and Tyler Aas, she is having the occasional lows and recommended that if she continues to have lows she needs to back off on her insulin 10 units to 5 units.. Patient is not currently on an ACE inhibitor/ARB. Patient has not seen an ophthalmologist this year. Patient denies any issues with their feet. The symptom started onset as an adult hyperlipidemia ARE RELATED TO DM   Hyperlipidemia Patient is coming in for recheck of his hyperlipidemia. The patient is currently taking no medication currently. They deny any issues with myalgias or history of liver damage from it. They deny any focal numbness or weakness or chest pain.   COPD Patient is coming in for COPD recheck today.  He is currently on Advair.  He has a mild chronic cough but denies any major coughing spells or wheezing spells.  He has 3nighttime symptoms per week and 3daytime symptoms per week currently.   Relevant past medical, surgical, family and social history reviewed and updated as indicated. Interim medical history since our last visit reviewed. Allergies and medications reviewed and updated.  Review of Systems  Constitutional: Negative for chills and fever.  Eyes: Negative for visual disturbance.  Respiratory: Positive for wheezing. Negative for chest tightness and shortness of breath.   Cardiovascular: Negative for chest pain and leg swelling.  Genitourinary: Negative for difficulty urinating and dysuria.  Musculoskeletal: Negative for back  pain and gait problem.  Skin: Negative for rash.  Neurological: Negative for light-headedness and headaches.  Psychiatric/Behavioral: Negative for agitation and behavioral problems.  All other systems reviewed and are negative.   Per HPI unless specifically indicated above   Allergies as of 06/24/2020      Reactions   Contrast Media [iodinated Diagnostic Agents] Shortness Of Breath   Iohexol Other (See Comments)   PASSED OUT DURING THE TEST   Metformin Swelling   Face and hands became swollen   Statins Swelling, Other (See Comments)   They make the patient hurt "all over" Myalgia, also   Albuterol Other (See Comments)   "Takes the skin off of the inside of my mouth"   Bextra [valdecoxib] Nausea And Vomiting, Swelling   Shut down my kidneys    Cefdinir Other (See Comments)   Pancreatitis   Cozaar [losartan Potassium] Other (See Comments)   dizziness   Nsaids Other (See Comments)   GI intolerance and pain all over, tolerates ibu   Zetia [ezetimibe] Swelling   Rofecoxib Other (See Comments)   "VIOXX"- "SHUT DOWN MY KIDNEYS"   Sulfa Antibiotics Nausea And Vomiting, Other (See Comments)   Pancreatitis      Medication List       Accurate as of June 24, 2020 11:41 AM. If you have any questions, ask your nurse or doctor.        aspirin EC 81 MG tablet Take 1 tablet (81 mg total) by mouth daily.   B-D UF III  MINI PEN NEEDLES 31G X 5 MM Misc Generic drug: Insulin Pen Needle 1 each by Does not apply route 4 (four) times daily.   blood glucose meter kit and supplies Use up to four times daily as directed   cyclobenzaprine 10 MG tablet Commonly known as: FLEXERIL Take 1 tablet (10 mg total) by mouth 3 (three) times daily as needed for muscle spasms.   digoxin 0.125 MG tablet Commonly known as: LANOXIN Take 0.5 tablets (0.0625 mg total) by mouth daily.   empagliflozin 10 MG Tabs tablet Commonly known as: JARDIANCE Take 10 mg by mouth daily before breakfast.     Fluticasone-Salmeterol 100-50 MCG/DOSE Aepb Commonly known as: ADVAIR Inhale 1 puff into the lungs 2 (two) times daily.   furosemide 20 MG tablet Commonly known as: LASIX Take 2 tablets daily. You may take an extra dose for increase weight gain orswelling   glimepiride 2 MG tablet Commonly known as: AMARYL Take 1-2 tablets (2-4 mg total) by mouth daily with breakfast.   ibuprofen 200 MG tablet Commonly known as: ADVIL Take 400 mg by mouth 2 (two) times daily as needed (for pain).   metoprolol succinate 25 MG 24 hr tablet Commonly known as: TOPROL-XL Take 1 tablet (25 mg total) by mouth daily.   OneTouch Delica Plus [id number] Misc CHECK GLUCOSE UP TO 4 TIMES DAILY AS DIRECTED Dx E11.9   OneTouch Verio test strip Generic drug: glucose blood TEst BS BID and as needed Dx E11.9   Tyler Aas FlexTouch 100 UNIT/ML FlexTouch Pen Generic drug: insulin degludec Inject 0.1 mLs (10 Units total) into the skin daily.   Vitamin D 50 MCG (2000 UT) tablet Take 4,000-6,000 Units by mouth daily.        Objective:   BP 127/68   Pulse 83   Temp 98.7 F (37.1 C)   Ht 5' (1.524 m)   Wt 111 lb (50.3 kg)   BMI 21.68 kg/m   Wt Readings from Last 3 Encounters:  06/24/20 111 lb (50.3 kg)  06/22/20 111 lb (50.3 kg)  05/18/20 111 lb 12.8 oz (50.7 kg)    Physical Exam Vitals and nursing note reviewed.  Constitutional:      General: She is not in acute distress.    Appearance: She is well-developed. She is not diaphoretic.  Eyes:     Conjunctiva/sclera: Conjunctivae normal.  Cardiovascular:     Rate and Rhythm: Normal rate and regular rhythm.     Heart sounds: Murmur heard.   Pulmonary:     Effort: Pulmonary effort is normal. No respiratory distress.     Breath sounds: Wheezing and rhonchi present.  Musculoskeletal:        General: No tenderness. Normal range of motion.  Skin:    General: Skin is warm and dry.     Findings: No rash.  Neurological:     Mental Status: She is  alert and oriented to person, place, and time.     Coordination: Coordination normal.  Psychiatric:        Behavior: Behavior normal.      Assessment & Plan:   Problem List Items Addressed This Visit      Respiratory   COPD GOLD II / active smoker   Relevant Medications   albuterol (VENTOLIN HFA) 108 (90 Base) MCG/ACT inhaler   predniSONE (DELTASONE) 20 MG tablet     Endocrine   Hyperlipidemia associated with type 2 diabetes mellitus ([place])   Type 2 diabetes mellitus ([place]) - Primary  Relevant Orders   Bayer DCA Hb A1c Waived   CMP14+EGFR      Continue current medication, will give short course of prednisone to help with breathing.  Mild exacerbation Follow up plan: Return in about 3 months (around 09/23/2020), or if symptoms worsen or fail to improve, for Diabetes and COPD and hyperlipidemia recheck.  Counseling provided for all of the vaccine components Orders Placed This Encounter  Procedures  . [facility] Hb A1c [place], MD [facility] 06/24/2020, 11:41 AM

## 2020-06-25 LAB — CMP14+EGFR
ALT: 14 IU/L (ref 0–32)
AST: 14 IU/L (ref 0–40)
Albumin/Globulin Ratio: 1.3 (ref 1.2–2.2)
Albumin: 4 g/dL (ref 3.7–4.7)
Alkaline Phosphatase: 69 IU/L (ref 44–121)
BUN/Creatinine Ratio: 22 (ref 12–28)
BUN: 30 mg/dL — ABNORMAL HIGH (ref 8–27)
Bilirubin Total: 0.6 mg/dL (ref 0.0–1.2)
CO2: 26 mmol/L (ref 20–29)
Calcium: 9.2 mg/dL (ref 8.7–10.3)
Chloride: 99 mmol/L (ref 96–106)
Creatinine, Ser: 1.34 mg/dL — ABNORMAL HIGH (ref 0.57–1.00)
GFR calc Af Amer: 43 mL/min/{1.73_m2} — ABNORMAL LOW (ref >59)
GFR calc non Af Amer: 38 mL/min/{1.73_m2} — ABNORMAL LOW (ref >59)
Globulin, Total: 3.2 g/dL (ref 1.5–4.5)
Glucose: 157 mg/dL — ABNORMAL HIGH (ref 65–99)
Potassium: 5 mmol/L (ref 3.5–5.2)
Sodium: 139 mmol/L (ref 134–144)
Total Protein: 7.2 g/dL (ref 6.0–8.5)

## 2020-07-15 ENCOUNTER — Ambulatory Visit (HOSPITAL_COMMUNITY)
Admission: RE | Admit: 2020-07-15 | Discharge: 2020-07-15 | Disposition: A | Discharge status: Home / Self Care | Payer: Medicare Other | Source: Ambulatory Visit | Attending: Cardiovascular Disease | Admitting: Cardiovascular Disease

## 2020-07-15 ENCOUNTER — Other Ambulatory Visit (HOSPITAL_COMMUNITY): Payer: Self-pay | Admitting: Cardiovascular Disease

## 2020-07-15 ENCOUNTER — Other Ambulatory Visit: Payer: Self-pay

## 2020-07-15 ENCOUNTER — Ambulatory Visit (HOSPITAL_BASED_OUTPATIENT_CLINIC_OR_DEPARTMENT_OTHER)
Admission: RE | Admit: 2020-07-15 | Discharge: 2020-07-15 | Disposition: A | Discharge status: Home / Self Care | Payer: Medicare Other | Source: Ambulatory Visit | Attending: Cardiovascular Disease | Admitting: Cardiovascular Disease

## 2020-07-15 DIAGNOSIS — I739 Peripheral vascular disease, unspecified: Secondary | ICD-10-CM | POA: Insufficient documentation

## 2020-07-15 DIAGNOSIS — Z95828 Presence of other vascular implants and grafts: Secondary | ICD-10-CM

## 2020-07-15 DIAGNOSIS — Z95820 Peripheral vascular angioplasty status with implants and grafts: Secondary | ICD-10-CM

## 2020-07-21 ENCOUNTER — Other Ambulatory Visit: Payer: Self-pay | Admitting: Family Medicine

## 2020-07-22 ENCOUNTER — Ambulatory Visit (INDEPENDENT_AMBULATORY_CARE_PROVIDER_SITE_OTHER): Payer: Medicare Other

## 2020-07-22 ENCOUNTER — Other Ambulatory Visit: Payer: Self-pay

## 2020-07-22 DIAGNOSIS — Z23 Encounter for immunization: Secondary | ICD-10-CM | POA: Diagnosis not present

## 2020-07-27 ENCOUNTER — Other Ambulatory Visit: Payer: Self-pay

## 2020-07-27 ENCOUNTER — Encounter: Payer: Self-pay | Admitting: Cardiovascular Disease

## 2020-07-27 ENCOUNTER — Ambulatory Visit: Payer: Medicare Other | Admitting: Cardiovascular Disease

## 2020-07-27 VITALS — BP 109/52 | HR 86 | Ht 60.0 in | Wt 114.0 lb

## 2020-07-27 DIAGNOSIS — I739 Peripheral vascular disease, unspecified: Secondary | ICD-10-CM

## 2020-07-27 DIAGNOSIS — Z720 Tobacco use: Secondary | ICD-10-CM

## 2020-07-27 DIAGNOSIS — E785 Hyperlipidemia, unspecified: Secondary | ICD-10-CM | POA: Diagnosis not present

## 2020-07-27 DIAGNOSIS — I5022 Chronic systolic (congestive) heart failure: Secondary | ICD-10-CM | POA: Diagnosis not present

## 2020-07-27 NOTE — Patient Instructions (Signed)
Follow-Up: At [facility], you and your health needs are our priority.  As part of our continuing mission to provide you with exceptional heart care, we have created designated Provider Care Teams.  These Care Teams include your primary Cardiologist (physician) and Advanced Practice Providers (APPs -  Physician Assistants and Nurse Practitioners) who all work together to provide you with the care you need, when you need it.  We recommend signing up for the patient portal called "MyChart".  Sign up information is provided on this After Visit Summary.  MyChart is used to connect with patients for Virtual Visits (Telemedicine).  Patients are able to view lab/test results, encounter notes, upcoming appointments, etc.  Non-urgent messages can be sent to your provider as well.   To learn more about what you can do with MyChart, go to [email or web address].    Your next appointment:   6 month(s)  The format for your next appointment:   In Person  Provider:   Kathlyn Sacramento, MD

## 2020-07-27 NOTE — Progress Notes (Signed)
Cardiology Office Note   Date:  07/27/2020   ID:  Hannah French, DOB 1941/09/13, MRN [id number]  PCP:  Dettinger, Fransisca Kaufmann, MD  Cardiologist:  Dr. Burt Knack  No chief complaint on file.     History of Present Illness: Hannah French Hannah French is a 79 y.o. female who is here today for follow-up visit regarding peripheral arterial disease.  The patient has known history of peripheral arterial disease with  severe bilateral iliac stenosis and asymptomatic left subclavian artery stenosis. She has prolonged history of tobacco use and diabetes.  Other medical problems include chronic systolic heart failure, COPD, bladder cancer, hyperlipidemia, pancreatitis and pseudogout.  Most recent vascular studies in August of 2020 showed significantly elevated velocities in bilateral common iliac arteries with normal ABI. Carotid Doppler in August of this year showed moderate bilateral carotid stenosis with atypical flow in the left vertebral artery with possible right subclavian artery stenosis.  Right vertebral artery had antegrade flow.  The patient had significant bilateral leg claudication and ultimately I proceeded with angiography in April which showed significant stenosis affecting the distal aorta above the iliac bifurcation extending into both iliac arteries.  I performed successful bilateral common iliac artery kissing stent placement extending into the distal aorta.  The procedure was extremely difficult due to the patient's continuous involuntary movements of her legs.   She continues to smoke.  The patient had a repeat lower extremity Doppler studies recently which showed an ABI of 0.92 on the right and 1.00 on the left.  Duplex showed significant stenosis in the right mid common iliac artery and borderline significant stenosis in the left common iliac artery.  She reports discomfort in the right groin radiating to her right thigh mostly with exertion but occasionally it can happen at  rest.  Past Medical History:  Diagnosis Date  . Abdominal bruit   . Bladder cancer ([place])   . CAD (coronary artery disease)    a. nonobstructive by cath 08/2018.  . Carotid bruit   . Chronic combined systolic and diastolic CHF (congestive heart failure) ([place])   . CKD (chronic kidney disease), stage II   . Diabetes mellitus    Type II  . Dyslipidemia   . Heart murmur   . Mild pulmonary hypertension ([place])   . Mitral valve prolapse    a. not seen on most recent echoes. Mild MR now.  Marland Kitchen NICM (nonischemic cardiomyopathy) ([place])   . Osteoporosis   . Pancreatitis, acute   . Pseudogout   . PVD (peripheral vascular disease) ([place])    a. 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  . Sinus tachycardia   . Tobacco abuse     Past Surgical History:  Procedure Laterality Date  . ABDOMINAL AORTOGRAM W/LOWER EXTREMITY N/A 12/31/2019   Procedure: ABDOMINAL AORTOGRAM W/LOWER EXTREMITY;  Surgeon: Wellington Hampshire, MD;  Location: [facility];  Service: Cardiovascular;  Laterality: N/A;  . APPENDECTOMY    . Hysterectomy-type unspecified    . LAPAROSCOPIC CHOLECYSTECTOMY  2009  . PERIPHERAL VASCULAR INTERVENTION  12/31/2019   Procedure: PERIPHERAL VASCULAR INTERVENTION;  Surgeon: Wellington Hampshire, MD;  Location: [facility];  Service: Cardiovascular;;  Bilateral Ili[place]  . RIGHT/LEFT HEART CATH AND CORONARY ANGIOGRAPHY N/A 09/16/2018   Procedure: RIGHT/LEFT HEART CATH AND CORONARY ANGIOGRAPHY;  Surgeon: Sherren Mocha, MD;  Location: [facility];  Service: Cardiovascular;  Laterality: N/A;     Current Outpatient Medications  Medication  Sig Dispense Refill  . albuterol (VENTOLIN HFA) 108 (90 Base) MCG/ACT inhaler Inhale 2 puffs into the lungs every 6 (six) hours as needed for wheezing or shortness of breath. 18 g 5  . aspirin EC 81 MG tablet Take 1 tablet (81 mg total) by mouth daily. 90 tablet 3  . blood glucose meter kit and supplies  Use up to four times daily as directed 1 each 0  . Cholecalciferol (VITAMIN D) 50 MCG (2000 UT) tablet Take 4,000-6,000 Units by mouth daily.     . cyclobenzaprine (FLEXERIL) 10 MG tablet TAKE 1 TABLET THREE TIMES DAILY FOR MUSCLE SPASM 30 tablet 0  . digoxin (LANOXIN) 0.125 MG tablet Take 0.5 tablets (0.0625 mg total) by mouth daily. 45 tablet 3  . empagliflozin (JARDIANCE) 10 MG TABS tablet Take 10 mg by mouth daily before breakfast. 30 tablet 6  . Fluticasone-Salmeterol (ADVAIR) 100-50 MCG/DOSE AEPB Inhale 1 puff into the lungs 2 (two) times daily. 1 each 3  . furosemide (LASIX) 20 MG tablet Take 2 tablets daily. You may take an extra dose for increase weight gain orswelling 180 tablet 0  . glimepiride (AMARYL) 2 MG tablet Take 1-2 tablets (2-4 mg total) by mouth daily with breakfast. 180 tablet 3  . glucose blood (ONETOUCH VERIO) test strip TEst BS BID and as needed Dx E11.9 200 each 3  . ibuprofen (ADVIL,MOTRIN) 200 MG tablet Take 400 mg by mouth 2 (two) times daily as needed (for pain).     . insulin degludec (TRESIBA FLEXTOUCH) 100 UNIT/ML FlexTouch Pen Inject 0.1 mLs (10 Units total) into the skin daily. 9 mL 3  . Insulin Pen Needle (B-D UF III MINI PEN NEEDLES) 31G X 5 MM MISC 1 each by Does not apply route 4 (four) times daily. 120 each 11  . Lancets (ONETOUCH DELICA PLUS [id number]) MISC CHECK GLUCOSE UP TO 4 TIMES DAILY AS DIRECTED Dx E11.9 400 each 3  . metoprolol succinate (TOPROL-XL) 25 MG 24 hr tablet Take 1 tablet (25 mg total) by mouth daily. 90 tablet 3   No current facility-administered medications for this visit.    Allergies:   Contrast media [iodinated diagnostic agents], Iohexol, Metformin, Statins, Albuterol, Bextra [valdecoxib], Cefdinir, Cozaar [losartan potassium], Nsaids, Zetia [ezetimibe], Rofecoxib, and Sulfa antibiotics    Social History:  The patient  reports that she has been smoking cigarettes. She has a 50.00 pack-year smoking history. She has never used  smokeless tobacco. She reports that she does not drink alcohol and does not use drugs.   Family History:  [name] family history includes Heart attack in her brother and maternal uncle; Heart failure in her mother; Hypertension in her brother and sister.    ROS:  Please see the history of present illness.   Otherwise, review of systems are positive for none.   All other systems are reviewed and negative.    PHYSICAL EXAM: VS:  BP (!) 109/52   Pulse 86   Ht 5' (1.524 m)   Wt 114 lb (51.7 kg)   SpO2 96%   BMI 22.26 kg/m  , BMI Body mass index is 22.26 kg/m. GEN: Well nourished, well developed, in no acute distress  HEENT: normal  Neck: no JVD, or masses. Bilateral carotid bruits Cardiac: RRR; no rubs, or gallops,no edema . 3/6 systolic ejection murmur in the aortic area which is mid peaking Respiratory:  clear to auscultation bilaterally, normal work of breathing GI: soft, nontender, nondistended, + BS MS: no deformity or  atrophy  Skin: warm and dry, no rash Neuro:  Strength and sensation are intact Psych: euthymic mood, full affect Vascular: Femoral pulses: Slightly diminished bilaterally.  EKG:  EKG is not ordered today.    Recent Labs: 11/19/2019: B Natriuretic Peptide 153.4 02/17/2020: Hemoglobin 11.1; Platelets 278 06/24/2020: ALT 14; BUN 30; Creatinine, Ser 1.34; Potassium 5.0; Sodium 139    Lipid Panel    Component Value Date/Time   CHOL 157 02/17/2020 1159   TRIG 129 02/17/2020 1159   TRIG 82 06/09/2014 1028   HDL 41 02/17/2020 1159   HDL 57 06/09/2014 1028   CHOLHDL 3.8 02/17/2020 1159   CHOLHDL 3.5 02/06/2013 0932   VLDL 20 02/06/2013 0932   LDLCALC 93 02/17/2020 1159   LDLCALC 131 (H) 06/09/2014 1028      Wt Readings from Last 3 Encounters:  07/27/20 114 lb (51.7 kg)  06/24/20 111 lb (50.3 kg)  06/22/20 111 lb (50.3 kg)       No flowsheet data found.    ASSESSMENT AND PLAN:  1.  Peripheral arterial disease: Status post bilateral common  iliac artery stent placement extending into the distal aorta.  Recent Doppler studies showed relatively normal ABI although there was evidence of significant restenosis of the right common iliac artery stent.  If not entirely clear if this is responsible for her discomfort in the groin and thigh area but that is certainly a possibility.  She does describe exertional leg pain although occasionally she has sudden twitches at rest.  Her PAD does not seem to be severe enough to cause rest pain.   I discussed with her the option of proceeding with angiography and possible endovascular intervention.  However, the patient has severe restless leg syndrome and it was extremely difficult to finish her procedure last time.  The only option would be to do general anesthesia with angiography but I am also concerned about that considering her age and comorbidities.  Given that her symptoms are not severe and she reports some improvement, and I favor continued observation for now.  2.  subclavian artery stenosis: I do not think this is critical.  She has a palpable radial pulse bilaterally and she denies arm claudication or symptoms of steal syndrome.  3.  Chronic systolic heart failure: Followed by Dr. Haroldine Laws .  She is on optimal medical therapy  4.  Hyperlipidemia: Intolerance to statins and Zetia.  5.  Tobacco use: Unfortunately, she is not able to quit.   Disposition:   FU with me in 6 months  Signed,  Kathlyn Sacramento, MD  07/27/2020 3:23 PM    [facility]

## 2020-07-28 DIAGNOSIS — M25561 Pain in right knee: Secondary | ICD-10-CM | POA: Diagnosis not present

## 2020-07-28 DIAGNOSIS — M25562 Pain in left knee: Secondary | ICD-10-CM | POA: Diagnosis not present

## 2020-08-23 ENCOUNTER — Telehealth: Payer: Self-pay | Admitting: Cardiovascular Disease

## 2020-08-23 ENCOUNTER — Telehealth (HOSPITAL_COMMUNITY): Payer: Self-pay | Admitting: Vascular Surgery

## 2020-08-23 NOTE — Telephone Encounter (Signed)
Returned pt call to make 6 month f/u w/ DB in Feb

## 2020-08-23 NOTE — Telephone Encounter (Signed)
Patient calling States that she is having issues with her toe - very painful Would like to know if there is anything Dr Fletcher Anon can prescribe  Please call to discuss

## 2020-08-23 NOTE — Telephone Encounter (Signed)
Returned call to patient, she reports starting to have left toe pain (toe beside pinky toe) about 2 weeks ago.   She intially thought this was due to her toenails, so she clipped her nails and soaked her feet and pain did not improve.    The last 3-4 days it has become very painful, very painful to touch.  States the toe is purple, the other toes/foot are not discolored.   Not cool to touch, no open wounds or sores that she can see.     Per chart review: hx of PAD, last OV 11/2 not having significant pain, continued observation.       Scheduled to see Dr. Fletcher Anon tomorrow at 11:20 to further assess.   Patient verbalized understanding.

## 2020-08-24 ENCOUNTER — Other Ambulatory Visit: Payer: Self-pay

## 2020-08-24 ENCOUNTER — Ambulatory Visit: Payer: Medicare Other | Admitting: Cardiovascular Disease

## 2020-08-24 ENCOUNTER — Telehealth: Payer: Self-pay | Admitting: *Deleted

## 2020-08-24 ENCOUNTER — Encounter: Payer: Self-pay | Admitting: Cardiovascular Disease

## 2020-08-24 VITALS — BP 99/51 | HR 77 | Ht 60.0 in | Wt 112.0 lb

## 2020-08-24 DIAGNOSIS — Z720 Tobacco use: Secondary | ICD-10-CM | POA: Diagnosis not present

## 2020-08-24 DIAGNOSIS — Z01812 Encounter for preprocedural laboratory examination: Secondary | ICD-10-CM | POA: Diagnosis not present

## 2020-08-24 DIAGNOSIS — E785 Hyperlipidemia, unspecified: Secondary | ICD-10-CM | POA: Diagnosis not present

## 2020-08-24 DIAGNOSIS — I739 Peripheral vascular disease, unspecified: Secondary | ICD-10-CM

## 2020-08-24 DIAGNOSIS — I5022 Chronic systolic (congestive) heart failure: Secondary | ICD-10-CM | POA: Diagnosis not present

## 2020-08-24 MED ORDER — PREDNISONE 50 MG PO TABS: ORAL_TABLET | ORAL | 0 refills | Status: DC

## 2020-08-24 MED ORDER — DIPHENHYDRAMINE HCL 50 MG PO TABS: ORAL_TABLET | ORAL | 0 refills | Status: DC

## 2020-08-24 NOTE — Telephone Encounter (Signed)
Spoke with the patient. She will need to be premedicated for the procedure on 12/8 due to a contrast allergy. These have been sent in for her.  13 hours prior to your procedure time take one Prednisone 50 mg tablet, then 7 hours prior to your procedure time take one Prednisone 50 mg tablet, and then 1-2 hours prior to procedure time (just before you leave for the hospital) take one Prednisone 50 mg tablet and one Benadryl 50 mg tablet. Please call the office if you have any questions.  She has also asked that her covid test be changed to [facility]. This has been scheduled for 08/30/20.

## 2020-08-24 NOTE — Progress Notes (Signed)
Cardiology Office Note   Date:  08/24/2020   ID:  Katheen French, DOB [birth year], MRN [id number]  PCP:  Dettinger, Fransisca Kaufmann, MD  Cardiologist:  Dr. Burt Knack  No chief complaint on file.     History of Present Illness: Hannah French is a 79 y.o. female who is here today for follow-up visit regarding peripheral arterial disease.  The patient has known history of peripheral arterial disease with  severe bilateral iliac stenosis and asymptomatic left subclavian artery stenosis. She has prolonged history of tobacco use and diabetes.  Other medical problems include chronic systolic heart failure, COPD, bladder cancer, hyperlipidemia, pancreatitis and pseudogout.  Most recent vascular studies in August of 2020 showed significantly elevated velocities in bilateral common iliac arteries with normal ABI. Carotid Doppler in August of this year showed moderate bilateral carotid stenosis with atypical flow in the left vertebral artery with possible right subclavian artery stenosis.  Right vertebral artery had antegrade flow.  The patient had significant bilateral leg claudication and ultimately I proceeded with angiography in April which showed significant stenosis affecting the distal aorta above the iliac bifurcation extending into both iliac arteries.  I performed successful bilateral common iliac artery kissing stent placement extending into the distal aorta.  The procedure was extremely difficult due to the patient's continuous involuntary movements of her legs.   She continues to smoke.  The patient had a repeat lower extremity Doppler studies last month which showed an ABI of 0.92 on the right and 1.00 on the left.  Duplex showed significant stenosis in the right mid common iliac artery and borderline significant stenosis in the left common iliac artery.  Over the last week, she developed painful left toes especially the fourth toe with dark purple discoloration.  The pain has  been constant day and night with no relief.  Her left foot is cold.   Past Medical History:  Diagnosis Date  . Abdominal bruit   . Bladder cancer ([place])   . CAD (coronary artery disease)    a. nonobstructive by cath 08/2018.  . Carotid bruit   . Chronic combined systolic and diastolic CHF (congestive heart failure) ([place])   . CKD (chronic kidney disease), stage II   . Diabetes mellitus    Type II  . Dyslipidemia   . Heart murmur   . Mild pulmonary hypertension ([place])   . Mitral valve prolapse    a. not seen on most recent echoes. Mild MR now.  Marland Kitchen NICM (nonischemic cardiomyopathy) ([place])   . Osteoporosis   . Pancreatitis, acute   . Pseudogout   . PVD (peripheral vascular disease) ([place])    a. 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  . Sinus tachycardia   . Tobacco abuse     Past Surgical History:  Procedure Laterality Date  . ABDOMINAL AORTOGRAM W/LOWER EXTREMITY N/A 12/31/2019   Procedure: ABDOMINAL AORTOGRAM W/LOWER EXTREMITY;  Surgeon: Wellington Hampshire, MD;  Location: [facility];  Service: Cardiovascular;  Laterality: N/A;  . APPENDECTOMY    . Hysterectomy-type unspecified    . LAPAROSCOPIC CHOLECYSTECTOMY  2009  . PERIPHERAL VASCULAR INTERVENTION  12/31/2019   Procedure: PERIPHERAL VASCULAR INTERVENTION;  Surgeon: Wellington Hampshire, MD;  Location: [facility];  Service: Cardiovascular;;  Bilateral Iliacs  . RIGHT/LEFT HEART CATH AND CORONARY ANGIOGRAPHY N/A 09/16/2018   Procedure: RIGHT/LEFT HEART CATH AND CORONARY ANGIOGRAPHY;  Surgeon: Sherren Mocha, MD;  Location: [facility];  Service: Cardiovascular;  Laterality: N/A;     Current Outpatient Medications  Medication Sig Dispense Refill  . albuterol (VENTOLIN HFA) 108 (90 Base) MCG/ACT inhaler Inhale 2 puffs into the lungs every 6 (six) hours as needed for wheezing or shortness of breath. 18 g 5  . aspirin EC 81 MG tablet Take 1 tablet (81 mg total) by  mouth daily. 90 tablet 3  . blood glucose meter kit and supplies Use up to four times daily as directed 1 each 0  . Cholecalciferol (VITAMIN D) 50 MCG (2000 UT) tablet Take 4,000-6,000 Units by mouth daily.     . cyclobenzaprine (FLEXERIL) 10 MG tablet TAKE 1 TABLET THREE TIMES DAILY FOR MUSCLE SPASM 30 tablet 0  . digoxin (LANOXIN) 0.125 MG tablet Take 0.5 tablets (0.0625 mg total) by mouth daily. 45 tablet 3  . empagliflozin (JARDIANCE) 10 MG TABS tablet Take 10 mg by mouth daily before breakfast. 30 tablet 6  . Fluticasone-Salmeterol (ADVAIR) 100-50 MCG/DOSE AEPB Inhale 1 puff into the lungs 2 (two) times daily. 1 each 3  . furosemide (LASIX) 20 MG tablet Take 2 tablets daily. You may take an extra dose for increase weight gain orswelling 180 tablet 0  . glimepiride (AMARYL) 2 MG tablet Take 1-2 tablets (2-4 mg total) by mouth daily with breakfast. 180 tablet 3  . glucose blood (ONETOUCH VERIO) test strip TEst BS BID and as needed Dx E11.9 200 each 3  . ibuprofen (ADVIL,MOTRIN) 200 MG tablet Take 400 mg by mouth 2 (two) times daily as needed (for pain).     . insulin degludec (TRESIBA FLEXTOUCH) 100 UNIT/ML FlexTouch Pen Inject 0.1 mLs (10 Units total) into the skin daily. 9 mL 3  . Insulin Pen Needle (B-D UF III MINI PEN NEEDLES) 31G X 5 MM MISC 1 each by Does not apply route 4 (four) times daily. 120 each 11  . Lancets (ONETOUCH DELICA PLUS LANCET33G) MISC CHECK GLUCOSE UP TO 4 TIMES DAILY AS DIRECTED Dx E11.9 400 each 3  . metoprolol succinate (TOPROL-XL) 25 MG 24 hr tablet Take 1 tablet (25 mg total) by mouth daily. 90 tablet 3   No current facility-administered medications for this visit.    Allergies:   Contrast media [iodinated diagnostic agents], Iohexol, Metformin, Statins, Albuterol, Bextra [valdecoxib], Cefdinir, Cozaar [losartan potassium], Nsaids, Zetia [ezetimibe], Rofecoxib, and Sulfa antibiotics    Social History:  The patient  reports that she has been smoking cigarettes.  She has a 50.00 pack-year smoking history. She has never used smokeless tobacco. She reports that she does not drink alcohol and does not use drugs.   Family History:  [name] family history includes Heart attack in her brother and maternal uncle; Heart failure in her mother; Hypertension in her brother and sister.    ROS:  Please see the history of present illness.   Otherwise, review of systems are positive for none.   All other systems are reviewed and negative.    PHYSICAL EXAM: VS:  BP (!) 99/51   Pulse 77   Ht 5' (1.524 m)   Wt 112 lb (50.8 kg)   SpO2 97%   BMI 21.87 kg/m  , BMI Body mass index is 21.87 kg/m. GEN: Well nourished, well developed, in no acute distress  HEENT: normal  Neck: no JVD, or masses. Bilateral carotid bruits Cardiac: RRR; no rubs, or gallops,no edema . 3/6 systolic ejection murmur in the aortic area which is mid peaking Respiratory:  clear to auscultation bilaterally,   normal work of breathing GI: soft, nontender, nondistended, + BS MS: no deformity or atrophy  Skin: warm and dry, no rash Neuro:  Strength and sensation are intact Psych: euthymic mood, full affect Vascular: Femoral pulses: Slightly diminished bilaterally.  Distal pulses are not palpable.  Purple discoloration of the second third and fourth left toes with extreme tenderness especially the fourth toe  EKG:  EKG is not ordered today.    Recent Labs: 11/19/2019: B Natriuretic Peptide 153.4 02/17/2020: Hemoglobin 11.1; Platelets 278 06/24/2020: ALT 14; BUN 30; Creatinine, Ser 1.34; Potassium 5.0; Sodium 139    Lipid Panel    Component Value Date/Time   CHOL 157 02/17/2020 1159   TRIG 129 02/17/2020 1159   TRIG 82 06/09/2014 1028   HDL 41 02/17/2020 1159   HDL 57 06/09/2014 1028   CHOLHDL 3.8 02/17/2020 1159   CHOLHDL 3.5 02/06/2013 0932   VLDL 20 02/06/2013 0932   LDLCALC 93 02/17/2020 1159   LDLCALC 131 (H) 06/09/2014 1028      Wt Readings from Last 3 Encounters:   08/24/20 112 lb (50.8 kg)  07/27/20 114 lb (51.7 kg)  06/24/20 111 lb (50.3 kg)       No flowsheet data found.    ASSESSMENT AND PLAN:  1.  Peripheral arterial disease: Status post bilateral common iliac artery stent placement extending into the distal aorta.  Recent Doppler studies showed relatively normal ABI although there was evidence of significant restenosis of the right common iliac artery stent.  She presents with sudden onset of rest pain affecting the left toes with early gangrenous changes.  She continues to smoke.  She is at high risk for limb loss.  Due to current presentation, I recommend proceeding with urgent abdominal aortogram with lower extremity runoff and possible endovascular intervention.  I discussed the procedure in details as well as risk and benefits.  Unfortunately, she has severe restless leg syndrome. We will have to strap her legs very well for the procedure.  Given her cardiopulmonary status, I do not think she is a good candidate for general anesthesia. Planned access is via the left brachial artery given kissing stents in the distal aorta.  2.  subclavian artery stenosis: I do not think this is critical.  She has a palpable radial pulse bilaterally and she denies arm claudication or symptoms of steal syndrome.  3.  Chronic systolic heart failure: Followed by Dr. Haroldine Laws .  She is on optimal medical therapy  4.  Hyperlipidemia: Intolerance to statins and Zetia.  5.  Tobacco use: Unfortunately, she is not able to quit.   Disposition:   FU with me in 1 month  Signed,  Kathlyn Sacramento, MD  08/24/2020 1:43 PM    [facility]

## 2020-08-24 NOTE — Patient Instructions (Addendum)
Medication Instructions:  No change   *If you need a refill on your cardiac medications before your next appointment, please call your pharmacy*   Lab Work: Labs today  See instructions  If you have labs (blood work) drawn today and your tests are completely normal, you will receive your results only by: Marland Kitchen MyChart Message (if you have MyChart) OR . A paper copy in the mail If you have any lab test that is abnormal or we need to change your treatment, we will call you to review the results.   Testing/Procedures:  WILL BE SCHEDULE  AT [facility]- Your physician has requested that you have a  LOWER EXTREMITIES PERIPHERAL  catheterization. LOWER EXTREMITIES catheterization is used to diagnose and/or treat various conditions. Doctors may recommend this procedure for a number of different reasons. The most common reason is to evaluate LEG pain. LEG pain can be a symptom of PERIPHERAL  artery disease (PAD), and LOWER EXTREMITY catheterization can show whether plaque is narrowing or blocking your LEG's arteries.     Follow-Up: At [facility], you and your health needs are our priority.  As part of our continuing mission to provide you with exceptional heart care, we have created designated Provider Care Teams.  These Care Teams include your primary Cardiologist (physician) and Advanced Practice Providers (APPs -  Physician Assistants and Nurse Practitioners) who all work together to provide you with the care you need, when you need it.     Your next appointment:    4  week(s)  The format for your next appointment:   In Person  Provider:   Kathlyn Sacramento, MD   Other Instructions      [address] [place] [address] Dept: [phone] Loc: [place]  08/24/2020  You are scheduled for a Peripheral Angiogram on Wednesday, December 8 with  Dr. Kathlyn Sacramento.  1. Please arrive at the [facility] (Main Entrance A) at [facility]: [address] [place], [place] [address] at 6:30 AM (This time is two hours before your procedure to ensure your preparation). Free valet parking service is available.   Special note: Every effort is made to have your procedure done on time. Please understand that emergencies sometimes delay scheduled procedures.  2. Diet: Do not eat solid foods after midnight.  The patient may have clear liquids until 5am upon the day of the procedure.  3. Labs: You will need to have blood drawn on CBC, BMP, November 30 at [facility]   Open: 8am - 5pm (Lunch 12:30 - 1:30)   Phone: [phone]. You do not need to be fasting.   You will need a COVID-19  test prior to your procedure. You are scheduled for Saturday Dec 4 , 2021  at  11:05 AM. This is a Drive Up Visit at [address] [place], [place] [address]. Someone will direct you to the appropriate testing line. Stay in your car and someone will be with you shortly.   4. Medication instructions in preparation for your procedure:   Contrast Allergy: No  Do not take the morning of procedure - Glimepiride 2 mg, Jardiance 10 mg , Tresiba 10 units   Stop taking, Lasix (Furosemide)  Wednesday, December 8, only that day   On the morning of your procedure, take your Aspirin  81 mg and any morning medicines  NOT listed above.  You may use sips of water.  5. Plan for one night stay--bring personal belongings. 6. Bring a current list of your medications and current insurance cards. 7. You MUST have a responsible person to drive you home. 8. Someone MUST be with you the first 24 hours after you arrive home or your discharge will be delayed. 9. Please wear clothes that are easy to get on and off and wear slip-on shoes.  Thank you for allowing [place] to care for you!   -- [facility]

## 2020-08-24 NOTE — H&P (View-Only) (Signed)
Cardiology Office Note   Date:  08/24/2020   ID:  Hannah French, DOB [birth year], MRN [id number]  PCP:  Dettinger, Fransisca Kaufmann, MD  Cardiologist:  Dr. Burt Knack  No chief complaint on file.     History of Present Illness: Hannah French is a 79 y.o. female who is here today for follow-up visit regarding peripheral arterial disease.  The patient has known history of peripheral arterial disease with  severe bilateral iliac stenosis and asymptomatic left subclavian artery stenosis. She has prolonged history of tobacco use and diabetes.  Other medical problems include chronic systolic heart failure, COPD, bladder cancer, hyperlipidemia, pancreatitis and pseudogout.  Most recent vascular studies in August of 2020 showed significantly elevated velocities in bilateral common iliac arteries with normal ABI. Carotid Doppler in August of this year showed moderate bilateral carotid stenosis with atypical flow in the left vertebral artery with possible right subclavian artery stenosis.  Right vertebral artery had antegrade flow.  The patient had significant bilateral leg claudication and ultimately I proceeded with angiography in April which showed significant stenosis affecting the distal aorta above the iliac bifurcation extending into both iliac arteries.  I performed successful bilateral common iliac artery kissing stent placement extending into the distal aorta.  The procedure was extremely difficult due to the patient's continuous involuntary movements of her legs.   She continues to smoke.  The patient had a repeat lower extremity Doppler studies last month which showed an ABI of 0.92 on the right and 1.00 on the left.  Duplex showed significant stenosis in the right mid common iliac artery and borderline significant stenosis in the left common iliac artery.  Over the last week, she developed painful left toes especially the fourth toe with dark purple discoloration.  The pain has  been constant day and night with no relief.  Her left foot is cold.   Past Medical History:  Diagnosis Date  . Abdominal bruit   . Bladder cancer ([place])   . CAD (coronary artery disease)    a. nonobstructive by cath 08/2018.  . Carotid bruit   . Chronic combined systolic and diastolic CHF (congestive heart failure) ([place])   . CKD (chronic kidney disease), stage II   . Diabetes mellitus    Type II  . Dyslipidemia   . Heart murmur   . Mild pulmonary hypertension ([place])   . Mitral valve prolapse    a. not seen on most recent echoes. Mild MR now.  Marland Kitchen NICM (nonischemic cardiomyopathy) ([place])   . Osteoporosis   . Pancreatitis, acute   . Pseudogout   . PVD (peripheral vascular disease) ([place])    a. 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  . Sinus tachycardia   . Tobacco abuse     Past Surgical History:  Procedure Laterality Date  . ABDOMINAL AORTOGRAM W/LOWER EXTREMITY N/A 12/31/2019   Procedure: ABDOMINAL AORTOGRAM W/LOWER EXTREMITY;  Surgeon: Wellington Hampshire, MD;  Location: [facility];  Service: Cardiovascular;  Laterality: N/A;  . APPENDECTOMY    . Hysterectomy-type unspecified    . LAPAROSCOPIC CHOLECYSTECTOMY  2009  . PERIPHERAL VASCULAR INTERVENTION  12/31/2019   Procedure: PERIPHERAL VASCULAR INTERVENTION;  Surgeon: Wellington Hampshire, MD;  Location: [facility];  Service: Cardiovascular;;  Bilateral Iliacs  . RIGHT/LEFT HEART CATH AND CORONARY ANGIOGRAPHY N/A 09/16/2018   Procedure: RIGHT/LEFT HEART CATH AND CORONARY ANGIOGRAPHY;  Surgeon: Sherren Mocha, MD;  Location: [facility];  Service: Cardiovascular;  Laterality: N/A;     Current Outpatient Medications  Medication Sig Dispense Refill  . albuterol (VENTOLIN HFA) 108 (90 Base) MCG/ACT inhaler Inhale 2 puffs into the lungs every 6 (six) hours as needed for wheezing or shortness of breath. 18 g 5  . aspirin EC 81 MG tablet Take 1 tablet (81 mg total) by  mouth daily. 90 tablet 3  . blood glucose meter kit and supplies Use up to four times daily as directed 1 each 0  . Cholecalciferol (VITAMIN D) 50 MCG (2000 UT) tablet Take 4,000-6,000 Units by mouth daily.     . cyclobenzaprine (FLEXERIL) 10 MG tablet TAKE 1 TABLET THREE TIMES DAILY FOR MUSCLE SPASM 30 tablet 0  . digoxin (LANOXIN) 0.125 MG tablet Take 0.5 tablets (0.0625 mg total) by mouth daily. 45 tablet 3  . empagliflozin (JARDIANCE) 10 MG TABS tablet Take 10 mg by mouth daily before breakfast. 30 tablet 6  . Fluticasone-Salmeterol (ADVAIR) 100-50 MCG/DOSE AEPB Inhale 1 puff into the lungs 2 (two) times daily. 1 each 3  . furosemide (LASIX) 20 MG tablet Take 2 tablets daily. You may take an extra dose for increase weight gain orswelling 180 tablet 0  . glimepiride (AMARYL) 2 MG tablet Take 1-2 tablets (2-4 mg total) by mouth daily with breakfast. 180 tablet 3  . glucose blood (ONETOUCH VERIO) test strip TEst BS BID and as needed Dx E11.9 200 each 3  . ibuprofen (ADVIL,MOTRIN) 200 MG tablet Take 400 mg by mouth 2 (two) times daily as needed (for pain).     . insulin degludec (TRESIBA FLEXTOUCH) 100 UNIT/ML FlexTouch Pen Inject 0.1 mLs (10 Units total) into the skin daily. 9 mL 3  . Insulin Pen Needle (B-D UF III MINI PEN NEEDLES) 31G X 5 MM MISC 1 each by Does not apply route 4 (four) times daily. 120 each 11  . Lancets (ONETOUCH DELICA PLUS [id number]) MISC CHECK GLUCOSE UP TO 4 TIMES DAILY AS DIRECTED Dx E11.9 400 each 3  . metoprolol succinate (TOPROL-XL) 25 MG 24 hr tablet Take 1 tablet (25 mg total) by mouth daily. 90 tablet 3   No current facility-administered medications for this visit.    Allergies:   Contrast media [iodinated diagnostic agents], Iohexol, Metformin, Statins, Albuterol, Bextra [valdecoxib], Cefdinir, Cozaar [losartan potassium], Nsaids, Zetia [ezetimibe], Rofecoxib, and Sulfa antibiotics    Social History:  The patient  reports that she has been smoking cigarettes.  She has a 50.00 pack-year smoking history. She has never used smokeless tobacco. She reports that she does not drink alcohol and does not use drugs.   Family History:  [name] family history includes Heart attack in her brother and maternal uncle; Heart failure in her mother; Hypertension in her brother and sister.    ROS:  Please see the history of present illness.   Otherwise, review of systems are positive for none.   All other systems are reviewed and negative.    PHYSICAL EXAM: VS:  BP (!) 99/51   Pulse 77   Ht 5' (1.524 m)   Wt 112 lb (50.8 kg)   SpO2 97%   BMI 21.87 kg/m  , BMI Body mass index is 21.87 kg/m. GEN: Well nourished, well developed, in no acute distress  HEENT: normal  Neck: no JVD, or masses. Bilateral carotid bruits Cardiac: RRR; no rubs, or gallops,no edema . 3/6 systolic ejection murmur in the aortic area which is mid peaking Respiratory:  clear to auscultation bilaterally,  normal work of breathing GI: soft, nontender, nondistended, + BS MS: no deformity or atrophy  Skin: warm and dry, no rash Neuro:  Strength and sensation are intact Psych: euthymic mood, full affect Vascular: Femoral pulses: Slightly diminished bilaterally.  Distal pulses are not palpable.  Purple discoloration of the second third and fourth left toes with extreme tenderness especially the fourth toe  EKG:  EKG is not ordered today.    Recent Labs: 11/19/2019: B Natriuretic Peptide 153.4 02/17/2020: Hemoglobin 11.1; Platelets 278 06/24/2020: ALT 14; BUN 30; Creatinine, Ser 1.34; Potassium 5.0; Sodium 139    Lipid Panel    Component Value Date/Time   CHOL 157 02/17/2020 1159   TRIG 129 02/17/2020 1159   TRIG 82 06/09/2014 1028   HDL 41 02/17/2020 1159   HDL 57 06/09/2014 1028   CHOLHDL 3.8 02/17/2020 1159   CHOLHDL 3.5 02/06/2013 0932   VLDL 20 02/06/2013 0932   LDLCALC 93 02/17/2020 1159   LDLCALC 131 (H) 06/09/2014 1028      Wt Readings from Last 3 Encounters:   08/24/20 112 lb (50.8 kg)  07/27/20 114 lb (51.7 kg)  06/24/20 111 lb (50.3 kg)       No flowsheet data found.    ASSESSMENT AND PLAN:  1.  Peripheral arterial disease: Status post bilateral common iliac artery stent placement extending into the distal aorta.  Recent Doppler studies showed relatively normal ABI although there was evidence of significant restenosis of the right common iliac artery stent.  She presents with sudden onset of rest pain affecting the left toes with early gangrenous changes.  She continues to smoke.  She is at high risk for limb loss.  Due to current presentation, I recommend proceeding with urgent abdominal aortogram with lower extremity runoff and possible endovascular intervention.  I discussed the procedure in details as well as risk and benefits.  Unfortunately, she has severe restless leg syndrome. We will have to strap her legs very well for the procedure.  Given her cardiopulmonary status, I do not think she is a good candidate for general anesthesia. Planned access is via the left brachial artery given kissing stents in the distal aorta.  2.  subclavian artery stenosis: I do not think this is critical.  She has a palpable radial pulse bilaterally and she denies arm claudication or symptoms of steal syndrome.  3.  Chronic systolic heart failure: Followed by Dr. Haroldine Laws .  She is on optimal medical therapy  4.  Hyperlipidemia: Intolerance to statins and Zetia.  5.  Tobacco use: Unfortunately, she is not able to quit.   Disposition:   FU with me in 1 month  Signed,  Kathlyn Sacramento, MD  08/24/2020 1:43 PM    [facility]

## 2020-08-25 LAB — BASIC METABOLIC PANEL
BUN/Creatinine Ratio: 31 — ABNORMAL HIGH (ref 12–28)
BUN: 41 mg/dL — ABNORMAL HIGH (ref 8–27)
CO2: 25 mmol/L (ref 20–29)
Calcium: 9.3 mg/dL (ref 8.7–10.3)
Chloride: 99 mmol/L (ref 96–106)
Creatinine, Ser: 1.32 mg/dL — ABNORMAL HIGH (ref 0.57–1.00)
GFR calc Af Amer: 44 mL/min/{1.73_m2} — ABNORMAL LOW (ref >59)
GFR calc non Af Amer: 38 mL/min/{1.73_m2} — ABNORMAL LOW (ref >59)
Glucose: 217 mg/dL — ABNORMAL HIGH (ref 65–99)
Potassium: 4.8 mmol/L (ref 3.5–5.2)
Sodium: 139 mmol/L (ref 134–144)

## 2020-08-25 LAB — CBC
Hematocrit: 34 % (ref 34.0–46.6)
Hemoglobin: 11.7 g/dL (ref 11.1–15.9)
MCH: 35 pg — ABNORMAL HIGH (ref 26.6–33.0)
MCHC: 34.4 g/dL (ref 31.5–35.7)
MCV: 102 fL — ABNORMAL HIGH (ref 79–97)
Platelets: 281 10*3/uL (ref 150–450)
RBC: 3.34 x10E6/uL — ABNORMAL LOW (ref 3.77–5.28)
RDW: 17.7 % — ABNORMAL HIGH (ref 11.7–15.4)
WBC: 9.7 10*3/uL (ref 3.4–10.8)

## 2020-08-28 ENCOUNTER — Other Ambulatory Visit (HOSPITAL_COMMUNITY): Payer: Medicare Other

## 2020-08-30 ENCOUNTER — Other Ambulatory Visit (HOSPITAL_COMMUNITY)
Admission: RE | Admit: 2020-08-30 | Discharge: 2020-08-30 | Disposition: A | Discharge status: Home / Self Care | Payer: Medicare Other | Source: Ambulatory Visit | Attending: Cardiovascular Disease | Admitting: Cardiovascular Disease

## 2020-08-30 ENCOUNTER — Other Ambulatory Visit: Payer: Self-pay

## 2020-08-30 DIAGNOSIS — Z01812 Encounter for preprocedural laboratory examination: Secondary | ICD-10-CM | POA: Diagnosis not present

## 2020-08-30 DIAGNOSIS — Z20822 Contact with and (suspected) exposure to covid-19: Secondary | ICD-10-CM | POA: Insufficient documentation

## 2020-08-30 LAB — SARS CORONAVIRUS 2 (TAT 6-24 HRS): SARS Coronavirus 2: NEGATIVE

## 2020-08-31 ENCOUNTER — Telehealth: Payer: Self-pay | Admitting: *Deleted

## 2020-08-31 NOTE — Telephone Encounter (Signed)
Pt contacted pre-abdominal aortogram  scheduled at [facility] for: Wednesday September 01, 2020 8:30 AM Verified arrival time and place: [facility]) at: 6:30 AM   No solid food after midnight prior to cath, clear liquids until 5 AM day of procedure.   CONTRAST ALLERGY: yes-13 hour Prednisone and Benadryl Prep reviewed with patient: 08/31/20 Prednisone 50 mg 7:30 PM 09/01/20 Prednisone 50 mg 1:30 AM 09/01/20 Prednisone 50 mg and Benadryl 50 mg just prior to leaving home for hospital. Pt advised not to drive.  Hold: Jardiance-AM of procedure Glimepiride -AM of procedure [place]-AM of procedure Lasix-day before and day of procedure-GFR 38-pt already taken today NSAIDs-hold until post procedure  Except hold medications AM meds can be  taken pre-cath with sips of water including: ASA 81 mg Prednisone 50 mg Benadryl 50 mg  Confirmed patient has responsible adult to drive home post procedure and be with patient first 24 hours after arriving home: yes  You are allowed ONE visitor in the waiting room during the time you are at the hospital for your procedure. Both you and your visitor must wear a mask once you enter the hospital.       COVID-19 Pre-Screening Questions:  . In the past 14 days have you had any symptoms concerning for COVID-19 infection (fever, chills, cough, or new shortness of breath)? no . In the past 14 days have you been around anyone with known Covid 19? No  Reviewed procedure/mask/visitor instructions, COVID-19 questions with patient.                      08/24/20 GFR 38-Dr Arida-Difficult to hydrate with her degree of cardiomyopathy. I am planning to minimize contrast use.

## 2020-09-01 ENCOUNTER — Encounter (HOSPITAL_COMMUNITY)
Admission: RE | Disposition: A | Discharge status: Home / Self Care | Payer: Self-pay | Source: Home / Self Care | Attending: Cardiovascular Disease

## 2020-09-01 ENCOUNTER — Ambulatory Visit (HOSPITAL_COMMUNITY)
Admission: RE | Admit: 2020-09-01 | Discharge: 2020-09-01 | Disposition: A | Discharge status: Home / Self Care | Payer: Medicare Other | Attending: Cardiovascular Disease | Admitting: Cardiovascular Disease

## 2020-09-01 ENCOUNTER — Other Ambulatory Visit: Payer: Self-pay

## 2020-09-01 DIAGNOSIS — E785 Hyperlipidemia, unspecified: Secondary | ICD-10-CM | POA: Diagnosis not present

## 2020-09-01 DIAGNOSIS — F17210 Nicotine dependence, cigarettes, uncomplicated: Secondary | ICD-10-CM | POA: Diagnosis not present

## 2020-09-01 DIAGNOSIS — I739 Peripheral vascular disease, unspecified: Secondary | ICD-10-CM | POA: Diagnosis not present

## 2020-09-01 DIAGNOSIS — I70262 Atherosclerosis of native arteries of extremities with gangrene, left leg: Secondary | ICD-10-CM | POA: Insufficient documentation

## 2020-09-01 DIAGNOSIS — I771 Stricture of artery: Secondary | ICD-10-CM | POA: Insufficient documentation

## 2020-09-01 DIAGNOSIS — Z882 Allergy status to sulfonamides status: Secondary | ICD-10-CM | POA: Insufficient documentation

## 2020-09-01 DIAGNOSIS — E1152 Type 2 diabetes mellitus with diabetic peripheral angiopathy with gangrene: Secondary | ICD-10-CM | POA: Insufficient documentation

## 2020-09-01 DIAGNOSIS — Z7951 Long term (current) use of inhaled steroids: Secondary | ICD-10-CM | POA: Diagnosis not present

## 2020-09-01 DIAGNOSIS — G2581 Restless legs syndrome: Secondary | ICD-10-CM | POA: Diagnosis not present

## 2020-09-01 DIAGNOSIS — Z7982 Long term (current) use of aspirin: Secondary | ICD-10-CM | POA: Insufficient documentation

## 2020-09-01 DIAGNOSIS — Z886 Allergy status to analgesic agent status: Secondary | ICD-10-CM | POA: Insufficient documentation

## 2020-09-01 DIAGNOSIS — Z888 Allergy status to other drugs, medicaments and biological substances status: Secondary | ICD-10-CM | POA: Insufficient documentation

## 2020-09-01 DIAGNOSIS — Z79899 Other long term (current) drug therapy: Secondary | ICD-10-CM | POA: Insufficient documentation

## 2020-09-01 DIAGNOSIS — Z881 Allergy status to other antibiotic agents status: Secondary | ICD-10-CM | POA: Insufficient documentation

## 2020-09-01 DIAGNOSIS — Z794 Long term (current) use of insulin: Secondary | ICD-10-CM | POA: Diagnosis not present

## 2020-09-01 HISTORY — PX: ABDOMINAL AORTOGRAM W/LOWER EXTREMITY: CATH118223

## 2020-09-01 LAB — GLUCOSE, CAPILLARY
Glucose-Capillary: 316 mg/dL — ABNORMAL HIGH (ref 70–99)
Glucose-Capillary: 150 mg/dL — ABNORMAL HIGH (ref 70–99)

## 2020-09-01 LAB — POCT ACTIVATED CLOTTING TIME: Activated Clotting Time: 160 seconds

## 2020-09-01 SURGERY — ABDOMINAL AORTOGRAM W/LOWER EXTREMITY
Anesthesia: LOCAL

## 2020-09-01 MED ORDER — LABETALOL HCL 5 MG/ML IV SOLN
INTRAVENOUS | Status: AC
  Filled 2020-09-01: qty 4

## 2020-09-01 MED ORDER — VERAPAMIL HCL 2.5 MG/ML IV SOLN
INTRAVENOUS | Status: AC
  Filled 2020-09-01: qty 2

## 2020-09-01 MED ORDER — INSULIN ASPART 100 UNIT/ML ~~LOC~~ SOLN
4.0000 [IU] | Freq: Once | SUBCUTANEOUS | Status: AC
  Administered 2020-09-01: 4 [IU] via SUBCUTANEOUS
  Filled 2020-09-01: qty 1

## 2020-09-01 MED ORDER — SODIUM CHLORIDE 0.9 % IV SOLN: 250.0000 mL | INTRAVENOUS | Status: DC | PRN

## 2020-09-01 MED ORDER — ASPIRIN 81 MG PO CHEW: 81.0000 mg | CHEWABLE_TABLET | ORAL | Status: DC

## 2020-09-01 MED ORDER — VERAPAMIL HCL 2.5 MG/ML IV SOLN
INTRAVENOUS | Status: DC | PRN
  Administered 2020-09-01: 10 mL via INTRA_ARTERIAL

## 2020-09-01 MED ORDER — SODIUM CHLORIDE 0.9% FLUSH: 3.0000 mL | INTRAVENOUS | Status: DC | PRN

## 2020-09-01 MED ORDER — HEPARIN SODIUM (PORCINE) 1000 UNIT/ML IJ SOLN
INTRAMUSCULAR | Status: AC
  Filled 2020-09-01: qty 1

## 2020-09-01 MED ORDER — LIDOCAINE HCL (PF) 1 % IJ SOLN
INTRAMUSCULAR | Status: DC | PRN
  Administered 2020-09-01: 2 mL via INTRADERMAL

## 2020-09-01 MED ORDER — SODIUM CHLORIDE 0.9 % IV SOLN: INTRAVENOUS | Status: DC

## 2020-09-01 MED ORDER — ONDANSETRON HCL 4 MG/2ML IJ SOLN: 4.0000 mg | Freq: Four times a day (QID) | INTRAMUSCULAR | Status: DC | PRN

## 2020-09-01 MED ORDER — INSULIN ASPART 100 UNIT/ML ~~LOC~~ SOLN
0.0000 [IU] | SUBCUTANEOUS | Status: DC
  Filled 2020-09-01: qty 0.06

## 2020-09-01 MED ORDER — HEPARIN SODIUM (PORCINE) 1000 UNIT/ML IJ SOLN
INTRAMUSCULAR | Status: DC | PRN
  Administered 2020-09-01: 2000 [IU] via INTRAVENOUS

## 2020-09-01 MED ORDER — ACETAMINOPHEN 325 MG PO TABS: 650.0000 mg | ORAL_TABLET | ORAL | Status: DC | PRN

## 2020-09-01 MED ORDER — SODIUM CHLORIDE 0.9% FLUSH: 3.0000 mL | Freq: Two times a day (BID) | INTRAVENOUS | Status: DC

## 2020-09-01 MED ORDER — LIDOCAINE HCL (PF) 1 % IJ SOLN
INTRAMUSCULAR | Status: AC
  Filled 2020-09-01: qty 30

## 2020-09-01 MED ORDER — HEPARIN (PORCINE) IN NACL 1000-0.9 UT/500ML-% IV SOLN
INTRAVENOUS | Status: DC | PRN
  Administered 2020-09-01 (×2): 500 mL

## 2020-09-01 MED ORDER — LABETALOL HCL 5 MG/ML IV SOLN: 10.0000 mg | INTRAVENOUS | Status: DC | PRN

## 2020-09-01 MED ORDER — HEPARIN (PORCINE) IN NACL 1000-0.9 UT/500ML-% IV SOLN
INTRAVENOUS | Status: AC
  Filled 2020-09-01: qty 1000

## 2020-09-01 MED ORDER — LABETALOL HCL 5 MG/ML IV SOLN
INTRAVENOUS | Status: DC | PRN
  Administered 2020-09-01: 10 mg via INTRAVENOUS

## 2020-09-01 MED ORDER — IODIXANOL 320 MG/ML IV SOLN
INTRAVENOUS | Status: DC | PRN
  Administered 2020-09-01: 75 mL via INTRA_ARTERIAL

## 2020-09-01 MED ORDER — SODIUM CHLORIDE 0.9 % IV SOLN: INTRAVENOUS | Status: AC

## 2020-09-01 SURGICAL SUPPLY — 15 items
CATH ANGIO 5F PIGTAIL 100CM (CATHETERS) ×2 IMPLANT
CATH TEMPO AQUA 5F 100CM (CATHETERS) ×2 IMPLANT
GUIDEWIRE ANGLED .035X260CM (WIRE) ×2 IMPLANT
KIT MICROPUNCTURE NIT STIFF (SHEATH) ×2 IMPLANT
KIT PV (KITS) ×2 IMPLANT
SHEATH PINNACLE 5F 10CM (SHEATH) ×2 IMPLANT
SHEATH PROBE COVER 6X72 (BAG) ×2 IMPLANT
STOPCOCK MORSE 400PSI 3WAY (MISCELLANEOUS) ×2 IMPLANT
SYR MEDRAD MARK 7 150ML (SYRINGE) ×2 IMPLANT
TRANSDUCER W/STOPCOCK (MISCELLANEOUS) ×2 IMPLANT
TRAY PV CATH (CUSTOM PROCEDURE TRAY) ×2 IMPLANT
TUBING CIL FLEX 10 FLL-RA (TUBING) ×4 IMPLANT
WIRE HITORQ VERSACORE ST 145CM (WIRE) ×2 IMPLANT
WIRE ROSEN-J .035X260CM (WIRE) ×2 IMPLANT
WIRE VERSACORE LOC 115CM (WIRE) ×2 IMPLANT

## 2020-09-01 NOTE — Interval H&P Note (Signed)
History and Physical Interval Note:  09/01/2020 8:53 AM  Hannah French  has presented today for surgery, with the diagnosis of pad.  The various methods of treatment have been discussed with the patient and family. After consideration of risks, benefits and other options for treatment, the patient has consented to  Procedure(s): ABDOMINAL AORTOGRAM W/LOWER EXTREMITY (N/A) as a surgical intervention.  [name] history has been reviewed, patient examined, no change in status, stable for surgery.  I have reviewed the patient's chart and labs.  Questions were answered to the patient's satisfaction.     Kathlyn [place]

## 2020-09-01 NOTE — Progress Notes (Signed)
Site area: Left brachial a 5 french arterial sheath was removed  Site Prior to Removal:  Level 0  Pressure Applied For 20 MINUTES    Bedrest Beginning at 1040am X 4 hours  Manual:   Yes.    Patient Status During Pull:  stable  Post Pull Groin Site:  Level 0  Post Pull Instructions Given:  Yes.    Post Pull Pulses Present:  Yes.    Dressing Applied:  Yes.    Comments:

## 2020-09-01 NOTE — Discharge Instructions (Signed)
Drink plenty of fluids for 48 hours Remove elbow board tonight before bed  Brachial Site Care   This sheet gives you information about how to care for yourself after your procedure. Your health care provider may also give you more specific instructions. If you have problems or questions, contact your health care provider. What can I expect after the procedure? After the procedure, it is common to have:  Bruising and tenderness at the catheter insertion area. Follow these instructions at home: Medicines  Take over-the-counter and prescription medicines only as told by your health care provider. Insertion site care 1. Follow instructions from your health care provider about how to take care of your insertion site. Make sure you: ? Wash your hands with soap and water before you change your bandage (dressing). If soap and water are not available, use hand sanitizer. ? Remove your dressing as told by your health care provider. In 24 hours 2. Check your insertion site every day for signs of infection. Check for: ? Redness, swelling, or pain. ? Fluid or blood. ? Pus or a bad smell. ? Warmth. 3. Do not take baths, swim, or use a hot tub until your health care provider approves. 4. You may shower 24-48 hours after the procedure, or as directed by your health care provider. ? Remove the dressing and gently wash the site with plain soap and water. ? Pat the area dry with a clean towel. ? Do not rub the site. That could cause bleeding. 5. Do not apply powder or lotion to the site. Activity   1. For 24 hours after the procedure, or as directed by your health care provider: ? Do not flex or bend the affected arm. ? Do not push or pull heavy objects with the affected arm. ? Do not drive yourself home from the hospital or clinic. You may drive 24 hours after the procedure unless your health care provider tells you not to. ? Do not operate machinery or power tools. 2. Do not lift anything that  is heavier than 10 lb (4.5 kg), or the limit that you are told, until your health care provider says that it is safe.  For 5 days 3. Ask your health care provider when it is okay to: ? Return to work or school. ? Resume usual physical activities or sports. ? Resume sexual activity. General instructions  If the catheter site starts to bleed, raise your arm and put firm pressure on the site. Call 911. This is a medical emergency.  If you went home on the same day as your procedure, a responsible adult should be with you for the first 24 hours after you arrive home.  Keep all follow-up visits as told by your health care provider. This is important. Contact a health care provider if:  You have a fever.  You have redness, swelling, or yellow drainage around your insertion site. Get help right away if:  You have unusual pain at the brachial site.  The catheter insertion area swells very fast.  The insertion area is bleeding, and the bleeding does not stop when you hold steady pressure on the area.  Your arm or hand becomes pale, cool, tingly, or numb. These symptoms may represent a serious problem that is an emergency. Do not wait to see if the symptoms will go away. Get medical help right away. Call your local emergency services (911 in the [place] Do not drive yourself to the hospital. Summary  After the procedure, it  is common to have bruising and tenderness at the site.  Follow instructions from your health care provider about how to take care of your brachial site wound. Check the wound every day for signs of infection.  Do not lift anything that is heavier than 10 lb (4.5 kg), or the limit that you are told, until your health care provider says that it is safe. This information is not intended to replace advice given to you by your health care provider. Make sure you discuss any questions you have with your health care provider. Document Revised: 10/17/2017 Document Reviewed:  10/17/2017 Elsevier Patient Education  2020 [employer].

## 2020-09-02 ENCOUNTER — Encounter (HOSPITAL_COMMUNITY): Payer: Self-pay | Admitting: Cardiovascular Disease

## 2020-09-07 ENCOUNTER — Telehealth: Payer: Self-pay | Admitting: *Deleted

## 2020-09-07 NOTE — Telephone Encounter (Signed)
Left a message for the patient to call back.  

## 2020-09-07 NOTE — Telephone Encounter (Signed)
Received direct call from patient into triage-patient states she had procedure with Dr. Fletcher Anon 12/8 and he informed her he would have to treat her with medication.  She states she went to the pharmacy and there was no new medications there for her to start.  She is unsure what the plan is from here, states her toes are not any better and she would like to know what she should do to improve this.      Advised would route to MD to review and advise on plan.

## 2020-09-08 ENCOUNTER — Encounter (HOSPITAL_COMMUNITY): Payer: Self-pay | Admitting: Cardiovascular Disease

## 2020-09-08 MED ORDER — RIVAROXABAN 2.5 MG PO TABS: 2.5000 mg | ORAL_TABLET | Freq: Two times a day (BID) | ORAL | 11 refills | Status: DC

## 2020-09-08 NOTE — Telephone Encounter (Signed)
Spoke with patient about MD recommendations. She agreed w/plan. Review bleeding risks, advised to monitor for this. Advised she may bruise/bleed easier. Advised to avoid use of ibuprofen as on her med list, but she states she does not take d/t renal function. Rx(s) sent to pharmacy electronically.

## 2020-09-08 NOTE — Telephone Encounter (Signed)
Holley Raring is returning Lisa's call. Please advise.

## 2020-09-08 NOTE — Telephone Encounter (Signed)
Patient states her foot/hurt is hurting her so bad that it is making her nauseous. She is not interested in pain pills. She states she is unable to sleep/walk due to the pain.   She said MD mentioned some medication(s) to her after her procedure but she has no new meds at pharmacy (see Hayley RN note below)  Message routed to Dr. Fletcher Anon

## 2020-09-08 NOTE — Telephone Encounter (Signed)
I was thinking about adding Pletal but then realized that we cannot use this medication due to her congestive heart failure. Probably a good idea to add small dose Xarelto 2.5 mg twice daily to help with the blood flow into the small branches in her foot.

## 2020-09-09 ENCOUNTER — Ambulatory Visit (INDEPENDENT_AMBULATORY_CARE_PROVIDER_SITE_OTHER): Payer: Medicare Other | Admitting: Family Medicine

## 2020-09-09 ENCOUNTER — Other Ambulatory Visit: Payer: Self-pay

## 2020-09-09 ENCOUNTER — Encounter: Payer: Self-pay | Admitting: Family Medicine

## 2020-09-09 VITALS — BP 112/60 | HR 82 | Temp 97.0°F | Ht 60.0 in | Wt 112.0 lb

## 2020-09-09 DIAGNOSIS — J441 Chronic obstructive pulmonary disease with (acute) exacerbation: Secondary | ICD-10-CM

## 2020-09-09 DIAGNOSIS — E785 Hyperlipidemia, unspecified: Secondary | ICD-10-CM | POA: Diagnosis not present

## 2020-09-09 DIAGNOSIS — Z23 Encounter for immunization: Secondary | ICD-10-CM | POA: Diagnosis not present

## 2020-09-09 DIAGNOSIS — J449 Chronic obstructive pulmonary disease, unspecified: Secondary | ICD-10-CM

## 2020-09-09 DIAGNOSIS — E1169 Type 2 diabetes mellitus with other specified complication: Secondary | ICD-10-CM | POA: Diagnosis not present

## 2020-09-09 LAB — BAYER DCA HB A1C WAIVED: HB A1C (BAYER DCA - WAIVED): 8.3 % — ABNORMAL HIGH (ref <7.0)

## 2020-09-09 LAB — BMP8+EGFR
BUN/Creatinine Ratio: 25 (ref 12–28)
BUN: 33 mg/dL — ABNORMAL HIGH (ref 8–27)
CO2: 25 mmol/L (ref 20–29)
Calcium: 9.3 mg/dL (ref 8.7–10.3)
Chloride: 99 mmol/L (ref 96–106)
Creatinine, Ser: 1.33 mg/dL — ABNORMAL HIGH (ref 0.57–1.00)
GFR calc Af Amer: 44 mL/min/{1.73_m2} — ABNORMAL LOW (ref >59)
GFR calc non Af Amer: 38 mL/min/{1.73_m2} — ABNORMAL LOW (ref >59)
Glucose: 137 mg/dL — ABNORMAL HIGH (ref 65–99)
Potassium: 4.3 mmol/L (ref 3.5–5.2)
Sodium: 140 mmol/L (ref 134–144)

## 2020-09-09 MED ORDER — FUROSEMIDE 20 MG PO TABS
ORAL_TABLET | ORAL | 3 refills | Status: DC
Start: 2020-09-09 — End: 2021-05-16

## 2020-09-09 MED ORDER — REPATHA 140 MG/ML ~~LOC~~ SOSY: 140.0000 mg | PREFILLED_SYRINGE | SUBCUTANEOUS | 11 refills | Status: DC

## 2020-09-09 MED ORDER — GLIMEPIRIDE 2 MG PO TABS: 2.0000 mg | ORAL_TABLET | Freq: Every day | ORAL | 3 refills | Status: DC

## 2020-09-09 MED ORDER — EMPAGLIFLOZIN 10 MG PO TABS
10.0000 mg | ORAL_TABLET | Freq: Every day | ORAL | 6 refills | Status: DC
Start: 2020-09-09 — End: 2021-01-19

## 2020-09-09 MED ORDER — FLUTICASONE-SALMETEROL 100-50 MCG/DOSE IN AEPB: 1.0000 | INHALATION_SPRAY | Freq: Two times a day (BID) | RESPIRATORY_TRACT | 3 refills | Status: DC

## 2020-09-09 MED ORDER — TRESIBA FLEXTOUCH 100 UNIT/ML ~~LOC~~ SOPN: 10.0000 [IU] | PEN_INJECTOR | Freq: Every day | SUBCUTANEOUS | 3 refills | Status: DC

## 2020-09-09 NOTE — Progress Notes (Signed)
BP 112/60   Pulse 82   Temp (!) 97 F (36.1 C)   Ht 5' (1.524 m)   Wt 112 lb (50.8 kg)   SpO2 96%   BMI 21.87 kg/m    Subjective:   Patient ID: Hannah French, female    DOB: December 11, 1940, 79 y.o.   MRN: [id number]  HPI: Hannah French is a 79 y.o. female presenting on 09/09/2020 for Medical Management of Chronic Issues, Diabetes, and Hyperlipidemia   HPI Type 2 diabetes mellitus Patient comes in today for recheck of his diabetes. Patient has been currently taking Jardiance and glimepiride and [place]. Patient is not currently on an ACE inhibitor/ARB. Patient has seen an ophthalmologist this year.  Patient has circulatory issues with her feet especially on the left foot on the toes. The symptom started onset as an adult hyperlipidemia and COPD and CAD and peripheral arterial disease ARE RELATED TO DM.  Patient's A1c is up today because she did some steroids and had a recent surgery.  She says now over the past few days with the insulin her blood sugars have been at 70 in the morning and she feels like they are starting to come back down.  A1c today is 8.3.  Patient is coming in complaining of her third and fourth toes on her left foot that it started turning purple and dark, she does have a vascular doctor that she has gone back to seen and they did a repeat surgery trying to look for clots and did not find any so they started her on Xarelto which she has not started yet.  She has a lot of pain with it and there was a lot of concern that she will possibly lose those toes.  She is following up with them on this.  Hyperlipidemia Patient is coming in for recheck of his hyperlipidemia. The patient is currently taking no medication currently. They deny any issues with myalgias or history of liver damage from it. They deny any focal numbness or weakness or chest pain.   COPD Patient is coming in for COPD recheck today.  He is currently on albuterol and Advair, sees pulmonology.   He has a mild chronic cough but denies any major coughing spells or wheezing spells.  He has 2nighttime symptoms per week and 2daytime symptoms per week currently.  Patient is still smoking  Relevant past medical, surgical, family and social history reviewed and updated as indicated. Interim medical history since our last visit reviewed. Allergies and medications reviewed and updated.  Review of Systems  Constitutional: Negative for chills and fever.  Eyes: Negative for visual disturbance.  Respiratory: Negative for chest tightness and shortness of breath.   Cardiovascular: Negative for chest pain and leg swelling.  Musculoskeletal: Negative for back pain and gait problem.  Skin: Positive for color change. Negative for rash.  Neurological: Negative for light-headedness and headaches.  Psychiatric/Behavioral: Negative for agitation and behavioral problems.  All other systems reviewed and are negative.   Per HPI unless specifically indicated above   Allergies as of 09/09/2020      Reactions   Contrast Media [iodinated Diagnostic Agents] Shortness Of Breath   Iohexol Other (See Comments)   PASSED OUT DURING THE TEST   Metformin Swelling   Face and hands became swollen   Statins Swelling, Other (See Comments)   They make the patient hurt "all over" Myalgia, also   Albuterol Other (See Comments)   "Takes the skin off of  the inside of my mouth"   Bextra [valdecoxib] Nausea And Vomiting, Swelling   Shut down my kidneys    Cefdinir Other (See Comments)   Pancreatitis   Cozaar [losartan Potassium] Other (See Comments)   dizziness   Nsaids Other (See Comments)   GI intolerance and pain all over, tolerates ibu   Zetia [ezetimibe] Swelling   Rofecoxib Other (See Comments)   "VIOXX"- "SHUT DOWN MY KIDNEYS"   Sulfa Antibiotics Nausea And Vomiting, Other (See Comments)   Pancreatitis      Medication List       Accurate as of September 09, 2020 10:51 AM. If you have any questions,  ask your nurse or doctor.        albuterol 108 (90 Base) MCG/ACT inhaler Commonly known as: VENTOLIN HFA Inhale 2 puffs into the lungs every 6 (six) hours as needed for wheezing or shortness of breath.   aspirin EC 81 MG tablet Take 1 tablet (81 mg total) by mouth daily.   B-D UF III MINI PEN NEEDLES 31G X 5 MM Misc Generic drug: Insulin Pen Needle 1 each by Does not apply route 4 (four) times daily.   blood glucose meter kit and supplies Use up to four times daily as directed   cyclobenzaprine 10 MG tablet Commonly known as: FLEXERIL TAKE 1 TABLET THREE TIMES DAILY FOR MUSCLE SPASM What changed: See the new instructions.   digoxin 0.125 MG tablet Commonly known as: LANOXIN Take 0.5 tablets (0.0625 mg total) by mouth daily.   diphenhydrAMINE 50 MG tablet Commonly known as: BENADRYL Take one 50 mg tablet the morning of the procedure with the last dose of the prednisone. What changed:   how much to take  how to take this  when to take this  additional instructions   empagliflozin 10 MG Tabs tablet Commonly known as: JARDIANCE Take 10 mg by mouth daily before breakfast.   ferrous sulfate 325 (65 FE) MG tablet Take 325 mg by mouth daily with breakfast.   Fluticasone-Salmeterol 100-50 MCG/DOSE Aepb Commonly known as: ADVAIR Inhale 1 puff into the lungs 2 (two) times daily.   furosemide 20 MG tablet Commonly known as: LASIX Take 2 tablets daily. You may take an extra dose for increase weight gain orswelling What changed: See the new instructions.   glimepiride 2 MG tablet Commonly known as: AMARYL Take 1-2 tablets (2-4 mg total) by mouth daily with breakfast. What changed: how much to take   ibuprofen 200 MG tablet Commonly known as: ADVIL Take 400 mg by mouth 2 (two) times daily as needed (for pain).   metoprolol succinate 25 MG 24 hr tablet Commonly known as: TOPROL-XL Take 1 tablet (25 mg total) by mouth daily.   OneTouch Delica Plus [id number]  Misc CHECK GLUCOSE UP TO 4 TIMES DAILY AS DIRECTED Dx E11.9   OneTouch Verio test strip Generic drug: glucose blood TEst BS BID and as needed Dx E11.9   Opcon-A 0.027-0.315 % Soln Generic drug: Naphazoline-Pheniramine Place 1 drop into both eyes daily as needed (Burning and itching eyes).   rivaroxaban 2.5 MG Tabs tablet Commonly known as: XARELTO Take 1 tablet (2.5 mg total) by mouth 2 (two) times daily.   Tyler Aas FlexTouch 100 UNIT/ML FlexTouch Pen Generic drug: insulin degludec Inject 0.1 mLs (10 Units total) into the skin daily.   Vitamin D 50 MCG (2000 UT) tablet Take 4,000 Units by mouth daily.        Objective:   BP 112/60  Pulse 82   Temp (!) 97 F (36.1 C)   Ht 5' (1.524 m)   Wt 112 lb (50.8 kg)   SpO2 96%   BMI 21.87 kg/m   Wt Readings from Last 3 Encounters:  09/09/20 112 lb (50.8 kg)  09/01/20 112 lb (50.8 kg)  08/24/20 112 lb (50.8 kg)    Physical Exam Vitals and nursing note reviewed.  Constitutional:      General: She is not in acute distress.    Appearance: She is well-developed and well-nourished. She is not diaphoretic.  Eyes:     Extraocular Movements: EOM normal.     Conjunctiva/sclera: Conjunctivae normal.  Cardiovascular:     Rate and Rhythm: Normal rate and regular rhythm.     Pulses: Intact distal pulses.     Heart sounds: Normal heart sounds. No murmur heard.   Pulmonary:     Effort: Pulmonary effort is normal. No respiratory distress.     Breath sounds: Normal breath sounds. No wheezing.  Musculoskeletal:        General: No tenderness or edema. Normal range of motion.  Skin:    General: Skin is warm and dry.     Coloration: Skin is cyanotic (Patient has purple cyanotic toes on the fourth and third digit on the end of both of those toes, does see vascular already and is supposed to start Xarelto soon).     Findings: No rash.  Neurological:     Mental Status: She is alert and oriented to person, place, and time.      Coordination: Coordination normal.  Psychiatric:        Mood and Affect: Mood and affect normal.        Behavior: Behavior normal.       Assessment & Plan:   Problem List Items Addressed This Visit      Respiratory   COPD GOLD II / active smoker   Relevant Medications   Fluticasone-Salmeterol (ADVAIR) 100-50 MCG/DOSE AEPB     Endocrine   Hyperlipidemia associated with type 2 diabetes mellitus (HCC)   Relevant Medications   glimepiride (AMARYL) 2 MG tablet   insulin degludec (TRESIBA FLEXTOUCH) 100 UNIT/ML FlexTouch Pen   empagliflozin (JARDIANCE) 10 MG TABS tablet   Evolocumab (REPATHA) 140 MG/ML SOSY   Type 2 diabetes mellitus (HCC) - Primary   Relevant Medications   glimepiride (AMARYL) 2 MG tablet   insulin degludec (TRESIBA FLEXTOUCH) 100 UNIT/ML FlexTouch Pen   empagliflozin (JARDIANCE) 10 MG TABS tablet   Other Relevant Orders   Bayer DCA Hb A1c Waived   BMP8+EGFR    Other Visit Diagnoses    Need for vaccination against Streptococcus pneumoniae       Relevant Orders   Pneumococcal conjugate vaccine 13-valent   COPD exacerbation (HCC)       Relevant Medications   Fluticasone-Salmeterol (ADVAIR) 100-50 MCG/DOSE AEPB      Continue current medication, will try to get Repatha to see if we can reduce cholesterol to see if he can open things up and help with CAD and PAD.  No other change medication.  A1c is elevated but the numbers are running back now that she is off the steroids, will see where she is at next time. Follow up plan: Return in about 3 months (around 12/08/2020), or if symptoms worsen or fail to improve, for COPD.  Counseling provided for all of the vaccine components Orders Placed This Encounter  Procedures  . Bayer DCA Hb A1c Waived  Caryl Pina, MD [facility] 09/09/2020, 10:51 AM

## 2020-09-15 ENCOUNTER — Telehealth: Payer: Self-pay | Admitting: Pharmacist

## 2020-09-15 NOTE — Telephone Encounter (Signed)
Repatha assistance needed Paperwork faxed to [facility] Department for assistance fax [phone] Paperwork placed in basket to be scanned in chart

## 2020-09-20 ENCOUNTER — Other Ambulatory Visit (HOSPITAL_COMMUNITY): Payer: Self-pay | Admitting: Internal Medicine

## 2020-09-28 ENCOUNTER — Ambulatory Visit (INDEPENDENT_AMBULATORY_CARE_PROVIDER_SITE_OTHER): Payer: Medicare Other | Admitting: *Deleted

## 2020-09-28 DIAGNOSIS — Z0000 Encounter for general adult medical examination without abnormal findings: Secondary | ICD-10-CM

## 2020-09-28 NOTE — Progress Notes (Signed)
MEDICARE ANNUAL WELLNESS VISIT  09/28/2020  Telephone Visit Disclaimer This Medicare AWV was conducted by telephone due to national recommendations for restrictions regarding the COVID-19 Pandemic (e.g. social distancing).  I verified, using two identifiers, that I am speaking with Hannah French or their authorized healthcare agent. I discussed the limitations, risks, security, and privacy concerns of performing an evaluation and management service by telephone and the potential availability of an in-person appointment in the future. The patient expressed understanding and agreed to proceed.  Location of Patient: Home Location of Provider (nurse):  [facility]  Subjective:    Hannah French is a 80 y.o. female patient of Dettinger, Fransisca Kaufmann, MD who had a Medicare Annual Wellness Visit today via telephone. Hannah French is Retired and lives alone. she has 2 children. she reports that she is socially active and does interact with friends/family regularly. she is not physically active and enjoys playing solitaire.  Patient Care Team: Dettinger, Fransisca Kaufmann, MD as PCP - General (Family Medicine) Bensimhon, Shaune Pascal, MD as Consulting Physician (Cardiology)  Advanced Directives 09/28/2020 12/31/2019 10/19/2018 09/16/2018 02/29/2016  Does Patient Have a Medical Advance Directive? Yes Yes Yes No No  Type of Paramedic of [place];Living will [place];Living will [place] in Chart? No - copy requested No - copy requested No - copy requested - -  Would patient like information on creating a medical advance directive? - - - Yes (MAU/Ambulatory/Procedural Areas - Information given) No - patient declined information    Hospital Utilization Over the Past 12 Months: # of hospitalizations or ER visits: 0 # of surgeries: 1- Did not have to stay overnight  Review of  Systems    Patient reports that her overall health is worse compared to last year. Due to vascular health.   History obtained from chart review and the patient Musculoskeletal ROS: positive for - pain in toe - left  Patient Reported Readings (BP, Pulse, CBG, Weight, etc) none  Pain Assessment Pain : 0-10 Pain Score: 9  Pain Type: Chronic pain Pain Location: Toe (Comment which one) Pain Orientation: Left Pain Descriptors / Indicators: Throbbing,Constant Pain Onset: More than a month ago Pain Frequency: Constant     Current Medications & Allergies (verified) Allergies as of 09/28/2020      Reactions   Contrast Media [iodinated Diagnostic Agents] Shortness Of Breath   Iohexol Other (See Comments)   PASSED OUT DURING THE TEST   Metformin Swelling   Face and hands became swollen   Statins Swelling, Other (See Comments)   They make the patient hurt "all over" Myalgia, also   Albuterol Other (See Comments)   "Takes the skin off of the inside of my mouth"   Bextra [valdecoxib] Nausea And Vomiting, Swelling   Shut down my kidneys    Cefdinir Other (See Comments)   Pancreatitis   Cozaar [losartan Potassium] Other (See Comments)   dizziness   Nsaids Other (See Comments)   GI intolerance and pain all over, tolerates ibu   Zetia [ezetimibe] Swelling   Rofecoxib Other (See Comments)   "VIOXX"- "SHUT DOWN MY KIDNEYS"   Sulfa Antibiotics Nausea And Vomiting, Other (See Comments)   Pancreatitis      Medication List       Accurate as of September 28, 2020 11:15 AM. If you have any questions, ask your nurse or doctor.  STOP taking these medications   diphenhydrAMINE 50 MG tablet Commonly known as: BENADRYL   ibuprofen 200 MG tablet Commonly known as: ADVIL     TAKE these medications   albuterol 108 (90 Base) MCG/ACT inhaler Commonly known as: VENTOLIN HFA Inhale 2 puffs into the lungs every 6 (six) hours as needed for wheezing or shortness of breath.   aspirin EC 81  MG tablet Take 1 tablet (81 mg total) by mouth daily.   B-D UF III MINI PEN NEEDLES 31G X 5 MM Misc Generic drug: Insulin Pen Needle 1 each by Does not apply route 4 (four) times daily.   blood glucose meter kit and supplies Use up to four times daily as directed   cyclobenzaprine 10 MG tablet Commonly known as: FLEXERIL TAKE 1 TABLET THREE TIMES DAILY FOR MUSCLE SPASM What changed: See the new instructions.   digoxin 0.125 MG tablet Commonly known as: LANOXIN TAKE (1/2) TABLET DAILY.   empagliflozin 10 MG Tabs tablet Commonly known as: JARDIANCE Take 1 tablet (10 mg total) by mouth daily before breakfast.   ferrous sulfate 325 (65 FE) MG tablet Take 325 mg by mouth daily with breakfast.   Fluticasone-Salmeterol 100-50 MCG/DOSE Aepb Commonly known as: ADVAIR Inhale 1 puff into the lungs 2 (two) times daily.   furosemide 20 MG tablet Commonly known as: LASIX Take 2 tablets daily. You may take an extra dose for increase weight gain orswelling   glimepiride 2 MG tablet Commonly known as: AMARYL Take 1-2 tablets (2-4 mg total) by mouth daily with breakfast.   metoprolol succinate 25 MG 24 hr tablet Commonly known as: TOPROL-XL Take 1 tablet (25 mg total) by mouth daily.   OneTouch Delica Plus [id number] Misc CHECK GLUCOSE UP TO 4 TIMES DAILY AS DIRECTED Dx E11.9   OneTouch Verio test strip Generic drug: glucose blood TEst BS BID and as needed Dx E11.9   Opcon-A 0.027-0.315 % Soln Generic drug: Naphazoline-Pheniramine Place 1 drop into both eyes daily as needed (Burning and itching eyes).   Repatha 140 MG/ML Sosy Generic drug: Evolocumab Inject 140 mg into the skin every 14 (fourteen) days.   rivaroxaban 2.5 MG Tabs tablet Commonly known as: XARELTO Take 1 tablet (2.5 mg total) by mouth 2 (two) times daily.   Tyler Aas FlexTouch 100 UNIT/ML FlexTouch Pen Generic drug: insulin degludec Inject 10 Units into the skin daily.   Vitamin D 50 MCG (2000 UT)  tablet Take 4,000 Units by mouth daily.       History (reviewed): Past Medical History:  Diagnosis Date  . Abdominal bruit   . Bladder cancer ([place])   . CAD (coronary artery disease)    a. nonobstructive by cath 08/2018.  . Carotid bruit   . Chronic combined systolic and diastolic CHF (congestive heart failure) ([place])   . CKD (chronic kidney disease), stage II   . Congestive heart failure (CHF) ([place])   . Diabetes mellitus    Type II  . Dyslipidemia   . Heart murmur   . Mild pulmonary hypertension ([place])   . Mitral valve prolapse    a. not seen on most recent echoes. Mild MR now.  Marland Kitchen NICM (nonischemic cardiomyopathy) ([place])   . Osteoporosis   . Pancreatitis, acute   . Pseudogout   . PVD (peripheral vascular disease) ([place])    a. 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  . Sinus tachycardia   . Tobacco abuse    Past Surgical History:  Procedure  Laterality Date  . ABDOMINAL AORTOGRAM W/LOWER EXTREMITY N/A 12/31/2019   Procedure: ABDOMINAL AORTOGRAM W/LOWER EXTREMITY;  Surgeon: Wellington Hampshire, MD;  Location: [facility];  Service: Cardiovascular;  Laterality: N/A;  . ABDOMINAL AORTOGRAM W/LOWER EXTREMITY N/A 09/01/2020   Procedure: ABDOMINAL AORTOGRAM W/LOWER EXTREMITY;  Surgeon: Wellington Hampshire, MD;  Location: [facility];  Service: Cardiovascular;  Laterality: N/A;  . APPENDECTOMY    . Hysterectomy-type unspecified    . LAPAROSCOPIC CHOLECYSTECTOMY  2009  . PERIPHERAL VASCULAR INTERVENTION  12/31/2019   Procedure: PERIPHERAL VASCULAR INTERVENTION;  Surgeon: Wellington Hampshire, MD;  Location: [facility];  Service: Cardiovascular;;  Bilateral [place]  . RIGHT/LEFT HEART CATH AND CORONARY ANGIOGRAPHY N/A 09/16/2018   Procedure: RIGHT/LEFT HEART CATH AND CORONARY ANGIOGRAPHY;  Surgeon: Sherren Mocha, MD;  Location: [facility];  Service: Cardiovascular;  Laterality: N/A;   Family History  Problem Relation  Age of Onset  . Heart failure Mother   . Hypertension Sister   . Heart attack Brother   . Heart attack Maternal Uncle   . Heart disease Sister   . COPD Brother   . Congestive Heart Failure Brother   . Congestive Heart Failure Daughter   . Rheum arthritis Son   . Ovarian cancer Sister   . Coronary artery disease Neg Hx        Early   Social History   Socioeconomic History  . Marital status: Divorced    Spouse name: Not on file  . Number of children: 2  . Years of education: Some College   . Highest education level: Some college, no degree  Occupational History  . Occupation: Retired  Tobacco Use  . Smoking status: Current Every Day Smoker    Packs/day: 1.00    Years: 50.00    Pack years: 50.00    Types: Cigarettes  . Smokeless tobacco: Never Used  Vaping Use  . Vaping Use: Never used  Substance and Sexual Activity  . Alcohol use: No  . Drug use: No  . Sexual activity: Not Currently  Other Topics Concern  . Not on file  Social History Narrative  . Not on file   Social Determinants of Health   Financial Resource Strain: Not on file  Food Insecurity: Not on file  Transportation Needs: Not on file  Physical Activity: Not on file  Stress: Not on file  Social Connections: Not on file    Activities of Daily Living In your present state of health, do you have any difficulty performing the following activities: 09/28/2020  Hearing? Y  Comment Has a lot of trouble. Has hearing aid but is allergic to plastic that goes into ear.  Vision? N  Comment Wears reading glasses  Difficulty concentrating or making decisions? N  Walking or climbing stairs? Y  Comment Yes due to heart condition  Dressing or bathing? N  Doing errands, shopping? N  Preparing Food and eating ? N  Using the Toilet? N  In the past six months, have you accidently leaked urine? N  Do you have problems with loss of bowel control? N  Managing your Medications? N  Managing your Finances? N   Housekeeping or managing your Housekeeping? N  Some recent data might be hidden    Patient Education/ Literacy How often do you need to have someone help you when you read instructions, pamphlets, or other written materials from your doctor or pharmacy?: 1 - Never What is the last grade  level you completed in school?: Some College  Exercise Current Exercise Habits: The patient does not participate in regular exercise at present, Exercise limited by: cardiac condition(s);respiratory conditions(s)  Diet Patient reports consuming 3 meals a day and 1 snack(s) a day Patient reports that her primary diet is: Diabetic Patient reports that she does have regular access to food.   Depression Screen PHQ 2/9 Scores 09/28/2020 06/24/2020 02/17/2020 11/20/2019 08/08/2019 03/10/2019 09/03/2018  PHQ - 2 Score 0 0 0 0 0 0 0     Fall Risk Fall Risk  09/28/2020 06/24/2020 02/17/2020 11/20/2019 08/08/2019  Falls in the past year? 0 0 0 0 0     Objective:  Hannah French seemed alert and oriented and she participated appropriately during our telephone visit.  Blood Pressure Weight BMI  BP Readings from Last 3 Encounters:  09/09/20 112/60  09/01/20 (!) 124/47  08/24/20 (!) 99/51   Wt Readings from Last 3 Encounters:  09/09/20 112 lb (50.8 kg)  09/01/20 112 lb (50.8 kg)  08/24/20 112 lb (50.8 kg)   BMI Readings from Last 1 Encounters:  09/09/20 21.87 kg/m    *Unable to obtain current vital signs, weight, and BMI due to telephone visit type  Hearing/Vision  . Hannah French did not seem to have difficulty with hearing/understanding during the telephone conversation . Reports that she has had a formal eye exam by an eye care professional within the past year . Reports that she has not had a formal hearing evaluation within the past year *Unable to fully assess hearing and vision during telephone visit type  Cognitive Function: 6CIT Screen 09/28/2020  What Year? 0 points  What month? 0 points   What time? 0 points  Count back from 20 0 points  Months in reverse 0 points  Repeat phrase 0 points  Total Score 0   (Normal:0-7, Significant for Dysfunction: >8)  Normal Cognitive Function Screening: Yes   Immunization & Health Maintenance Record Immunization History  Administered Date(s) Administered  . Fluad Quad(high Dose 65+) 06/14/2019, 07/22/2020  . Influenza Inj Mdck Quad Pf 06/14/2019  . Influenza, High Dose Seasonal PF 08/14/2017  . Influenza,inj,Quad PF,6+ Mos 08/06/2013, 07/20/2014, 07/30/2015  . Influenza-Unspecified 07/05/2018  . Moderna Sars-Covid-2 Vaccination 11/06/2019, 12/05/2019, 06/16/2020  . Pneumococcal Conjugate-13 09/09/2020  . Pneumococcal Polysaccharide-23 08/08/2019  . Tdap 03/17/2011  . Zoster 05/12/2010  . Zoster Recombinat (Shingrix) 11/14/2017, 04/19/2018    Health Maintenance  Topic Date Due  . MAMMOGRAM  07/01/2020  . URINE MICROALBUMIN  11/19/2020  . OPHTHALMOLOGY EXAM  10/25/2020 (Originally 05/26/2016)  . DEXA SCAN  12/06/2020 (Originally 09/02/2016)  . FOOT EXAM  11/19/2020  . COVID-19 Vaccine (4 - Booster for Moderna series) 12/14/2020  . HEMOGLOBIN A1C  03/10/2021  . TETANUS/TDAP  03/16/2021  . INFLUENZA VACCINE  Completed  . PNA vac Low Risk Adult  Completed       Assessment  This is a routine wellness examination for Hannah French.  Health Maintenance: Due or Overdue Health Maintenance Due  Topic Date Due  . MAMMOGRAM  07/01/2020  . URINE MICROALBUMIN  11/19/2020    Hannah French does not need a referral for Community Assistance: Care Management:   no Social Work:    no Prescription Assistance:  no Nutrition/Diabetes Education:  no   Plan:  Personalized Goals Goals Addressed            This Visit's Progress   . AWV  09/28/2020 AWV Goal: Fall Prevention  . Over the next year, patient will decrease their risk for falls by: o Using assistive devices, such as a cane or walker, as  needed o Identifying fall risks within their home and correcting them by: - Removing throw rugs - Adding handrails to stairs or ramps - Removing clutter and keeping a clear pathway throughout the home - Increasing light, especially at night - Adding shower handles/bars - Raising toilet seat o Identifying potential personal risk factors for falls: - Medication side effects - Incontinence/urgency - Vestibular dysfunction - Hearing loss - Musculoskeletal disorders - Neurological disorders - Orthostatic hypotension       Personalized Health Maintenance & Screening Recommendations  Screening mammography  Lung Cancer Screening Recommended: yes (Low Dose CT Chest recommended if Age 83-80 years, 30 pack-year currently smoking OR have quit w/in past 15 years) Hepatitis C Screening recommended: no HIV Screening recommended: no  Advanced Directives: Written information was not prepared per patient's request.  Referrals & Orders No orders of the defined types were placed in this encounter.   Follow-up Plan . Follow-up with Dettinger, Fransisca Kaufmann, MD as planned . Schedule your yearly mammogram .    I have personally reviewed and noted the following in the patient's chart:   . Medical and social history . Use of alcohol, tobacco or illicit drugs  . Current medications and supplements . Functional ability and status . Nutritional status . Physical activity . Advanced directives . List of other physicians . Hospitalizations, surgeries, and ER visits in previous 12 months . Vitals . Screenings to include cognitive, depression, and falls . Referrals and appointments  In addition, I have reviewed and discussed with Hannah French certain preventive protocols, quality metrics, and best practice recommendations. A written personalized care plan for preventive services as well as general preventive health recommendations is available and can be mailed to the patient at her  request.      Lynnea Ferrier, LPN  03/30/2830

## 2020-09-28 NOTE — Patient Instructions (Signed)
  MEDICARE ANNUAL WELLNESS VISIT Health Maintenance Summary and Written Plan of Care  Hannah French ,  Thank you for allowing me to perform your Medicare Annual Wellness Visit and for your ongoing commitment to your health.   Health Maintenance & Immunization History Health Maintenance  Topic Date Due  . MAMMOGRAM  07/01/2020  . URINE MICROALBUMIN  11/19/2020  . OPHTHALMOLOGY EXAM  10/25/2020 (Originally 05/26/2016)  . DEXA SCAN  12/06/2020 (Originally 09/02/2016)  . FOOT EXAM  11/19/2020  . COVID-19 Vaccine (4 - Booster for Moderna series) 12/14/2020  . HEMOGLOBIN A1C  03/10/2021  . TETANUS/TDAP  03/16/2021  . INFLUENZA VACCINE  Completed  . PNA vac Low Risk Adult  Completed   Immunization History  Administered Date(s) Administered  . Fluad Quad(high Dose 65+) 06/14/2019, 07/22/2020  . Influenza Inj Mdck Quad Pf 06/14/2019  . Influenza, High Dose Seasonal PF 08/14/2017  . Influenza,inj,Quad PF,6+ Mos 08/06/2013, 07/20/2014, 07/30/2015  . Influenza-Unspecified 07/05/2018  . Moderna Sars-Covid-2 Vaccination 11/06/2019, 12/05/2019, 06/16/2020  . Pneumococcal Conjugate-13 09/09/2020  . Pneumococcal Polysaccharide-23 08/08/2019  . Tdap 03/17/2011  . Zoster 05/12/2010  . Zoster Recombinat (Shingrix) 11/14/2017, 04/19/2018    These are the patient goals that we discussed: Goals Addressed            This Visit's Progress   . AWV       09/28/2020 AWV Goal: Fall Prevention  . Over the next year, patient will decrease their risk for falls by: o Using assistive devices, such as a cane or walker, as needed o Identifying fall risks within their home and correcting them by: - Removing throw rugs - Adding handrails to stairs or ramps - Removing clutter and keeping a clear pathway throughout the home - Increasing light, especially at night - Adding shower handles/bars - Raising toilet seat o Identifying potential personal risk factors for falls: - Medication side  effects - Incontinence/urgency - Vestibular dysfunction - Hearing loss - Musculoskeletal disorders - Neurological disorders - Orthostatic hypotension         This is a list of Health Maintenance Items that are overdue or due now: Health Maintenance Due  Topic Date Due  . MAMMOGRAM  07/01/2020  . URINE MICROALBUMIN  11/19/2020     Orders/Referrals Placed Today: No orders of the defined types were placed in this encounter.  (Contact our referral department at [phone] if you have not spoken with someone about your referral appointment within the next 5 days)    Follow-up Plan . Follow-up with Dettinger, Elige Radon, MD as planned . Schedule your yearly mammogram

## 2020-10-05 ENCOUNTER — Other Ambulatory Visit: Payer: Self-pay

## 2020-10-05 ENCOUNTER — Ambulatory Visit: Payer: Medicare Other | Admitting: Cardiovascular Disease

## 2020-10-05 ENCOUNTER — Encounter: Payer: Self-pay | Admitting: Cardiovascular Disease

## 2020-10-05 VITALS — BP 108/58 | HR 79 | Ht 60.0 in | Wt 112.6 lb

## 2020-10-05 DIAGNOSIS — I739 Peripheral vascular disease, unspecified: Secondary | ICD-10-CM | POA: Diagnosis not present

## 2020-10-05 DIAGNOSIS — Z720 Tobacco use: Secondary | ICD-10-CM

## 2020-10-05 DIAGNOSIS — E785 Hyperlipidemia, unspecified: Secondary | ICD-10-CM

## 2020-10-05 DIAGNOSIS — I5022 Chronic systolic (congestive) heart failure: Secondary | ICD-10-CM

## 2020-10-05 MED ORDER — GABAPENTIN 300 MG PO CAPS: 300.0000 mg | ORAL_CAPSULE | Freq: Three times a day (TID) | ORAL | 3 refills | Status: DC

## 2020-10-05 NOTE — Patient Instructions (Signed)
Medication Instructions:   START GABAPENTIN 300 MG TAKE 1 TABLET ONCE ON DAY #1 THEN TAKE 1 TABLET TWICE DAILY ON DAY # 2 THEN TAKE 1 TABLET 3 TIMES DAILY  *If you need a refill on your cardiac medications before your next appointment, please call your pharmacy*   Follow-Up: At [facility], you and your health needs are our priority.  As part of our continuing mission to provide you with exceptional heart care, we have created designated Provider Care Teams.  These Care Teams include your primary Cardiologist (physician) and Advanced Practice Providers (APPs -  Physician Assistants and Nurse Practitioners) who all work together to provide you with the care you need, when you need it.  We recommend signing up for the patient portal called "MyChart".  Sign up information is provided on this After Visit Summary.  MyChart is used to connect with patients for Virtual Visits (Telemedicine).  Patients are able to view lab/test results, encounter notes, upcoming appointments, etc.  Non-urgent messages can be sent to your provider as well.   To learn more about what you can do with MyChart, go to [email or web address].    Your next appointment:    AS SCHEDULED

## 2020-10-05 NOTE — Progress Notes (Signed)
Cardiology Office Note   Date:  10/05/2020   ID:  Hannah French, DOB 1941-08-09, MRN [id number]  PCP:  Dettinger, Fransisca Kaufmann, MD  Cardiologist:  Dr. Burt Knack  No chief complaint on file.     History of Present Illness: Hannah French is a 80 y.o. female who is here today for follow-up visit regarding peripheral arterial disease.  The patient has known history of peripheral arterial disease with  severe bilateral iliac stenosis and asymptomatic left subclavian artery stenosis. She has prolonged history of tobacco use and diabetes.  Other medical problems include chronic systolic heart failure, COPD, bladder cancer, hyperlipidemia, pancreatitis and pseudogout. Carotid Doppler in August of this year showed moderate bilateral carotid stenosis with atypical flow in the left vertebral artery with possible right subclavian artery stenosis.  Right vertebral artery had antegrade flow.  The patient had significant bilateral leg claudication and ultimately I proceeded with angiography in April 2021 which showed significant stenosis affecting the distal aorta above the iliac bifurcation extending into both iliac arteries.  I performed successful bilateral common iliac artery kissing stent placement extending into the distal aorta.  The procedure was extremely difficult due to the patient's continuous involuntary movements of her legs.   She continues to smoke.   The patient had a repeat lower extremity Doppler studies in October which showed an ABI of 0.92 on the right and 1.00 on the left.  Duplex showed significant stenosis in the right mid common iliac artery and borderline significant stenosis in the left common iliac artery.  She She was seen recently for painful left toes especially the fourth toe with dark purple discoloration.   Presented with angiography last month which showed patent bilateral common iliac artery stents with mild in-stent restenosis.  There was moderate left SFA  disease with occluded distal anterior tibial artery into the dorsalis pedis which was felt to be embolic and could have been responsible for her rectal findings.  In addition, she did have small vessel disease affecting the pedal arch from the posterior tibial artery.  No good options for revascularization.  I started her on small dose Xarelto 2.5 mg twice daily.  She is still having significant discomfort in her toes on the left side especially the fourth 1 but the color has improved since addition of Xarelto.   Past Medical History:  Diagnosis Date  . Abdominal bruit   . Bladder cancer ([place])   . CAD (coronary artery disease)    a. nonobstructive by cath 08/2018.  . Carotid bruit   . Chronic combined systolic and diastolic CHF (congestive heart failure) ([place])   . CKD (chronic kidney disease), stage II   . Congestive heart failure (CHF) ([place])   . Diabetes mellitus    Type II  . Dyslipidemia   . Heart murmur   . Mild pulmonary hypertension ([place])   . Mitral valve prolapse    a. not seen on most recent echoes. Mild MR now.  Marland Kitchen NICM (nonischemic cardiomyopathy) ([place])   . Osteoporosis   . Pancreatitis, acute   . Pseudogout   . PVD (peripheral vascular disease) ([place])    a. 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  . Sinus tachycardia   . Tobacco abuse     Past Surgical History:  Procedure Laterality Date  . ABDOMINAL AORTOGRAM W/LOWER EXTREMITY N/A 12/31/2019   Procedure: ABDOMINAL AORTOGRAM W/LOWER EXTREMITY;  Surgeon: Wellington Hampshire, MD;  Location: [facility];  Service: Cardiovascular;  Laterality: N/A;  . ABDOMINAL AORTOGRAM W/LOWER EXTREMITY N/A 09/01/2020   Procedure: ABDOMINAL AORTOGRAM W/LOWER EXTREMITY;  Surgeon: Wellington Hampshire, MD;  Location: [facility];  Service: Cardiovascular;  Laterality: N/A;  . APPENDECTOMY    . Hysterectomy-type unspecified    . LAPAROSCOPIC CHOLECYSTECTOMY  2009  . PERIPHERAL VASCULAR  INTERVENTION  12/31/2019   Procedure: PERIPHERAL VASCULAR INTERVENTION;  Surgeon: Wellington Hampshire, MD;  Location: [facility];  Service: Cardiovascular;;  Bilateral [place]  . RIGHT/LEFT HEART CATH AND CORONARY ANGIOGRAPHY N/A 09/16/2018   Procedure: RIGHT/LEFT HEART CATH AND CORONARY ANGIOGRAPHY;  Surgeon: Sherren Mocha, MD;  Location: [facility];  Service: Cardiovascular;  Laterality: N/A;     Current Outpatient Medications  Medication Sig Dispense Refill  . albuterol (VENTOLIN HFA) 108 (90 Base) MCG/ACT inhaler Inhale 2 puffs into the lungs every 6 (six) hours as needed for wheezing or shortness of breath. 18 g 5  . aspirin EC 81 MG tablet Take 1 tablet (81 mg total) by mouth daily. 90 tablet 3  . blood glucose meter kit and supplies Use up to four times daily as directed 1 each 0  . Cholecalciferol (VITAMIN D) 50 MCG (2000 UT) tablet Take 4,000 Units by mouth daily.     . cyclobenzaprine (FLEXERIL) 10 MG tablet TAKE 1 TABLET THREE TIMES DAILY FOR MUSCLE SPASM (Patient taking differently: Take 10 mg by mouth daily as needed for muscle spasms.) 30 tablet 0  . digoxin (LANOXIN) 0.125 MG tablet TAKE (1/2) TABLET DAILY. 45 tablet 0  . empagliflozin (JARDIANCE) 10 MG TABS tablet Take 1 tablet (10 mg total) by mouth daily before breakfast. 30 tablet 6  . ferrous sulfate 325 (65 FE) MG tablet Take 325 mg by mouth daily with breakfast.    . Fluticasone-Salmeterol (ADVAIR) 100-50 MCG/DOSE AEPB Inhale 1 puff into the lungs 2 (two) times daily. 1 each 3  . furosemide (LASIX) 20 MG tablet Take 2 tablets daily. You may take an extra dose for increase weight gain orswelling 180 tablet 3  . glimepiride (AMARYL) 2 MG tablet Take 1-2 tablets (2-4 mg total) by mouth daily with breakfast. 180 tablet 3  . glucose blood (ONETOUCH VERIO) test strip TEst BS BID and as needed Dx E11.9 200 each 3  . insulin degludec (TRESIBA FLEXTOUCH) 100 UNIT/ML FlexTouch Pen Inject 10 Units into the skin daily. 9  mL 3  . Insulin Pen Needle (B-D UF III MINI PEN NEEDLES) 31G X 5 MM MISC 1 each by Does not apply route 4 (four) times daily. 120 each 11  . Lancets (ONETOUCH DELICA PLUS OINOMV67M) MISC CHECK GLUCOSE UP TO 4 TIMES DAILY AS DIRECTED Dx E11.9 400 each 3  . metoprolol succinate (TOPROL-XL) 25 MG 24 hr tablet Take 1 tablet (25 mg total) by mouth daily. 90 tablet 3  . Naphazoline-Pheniramine (OPCON-A) 0.027-0.315 % SOLN Place 1 drop into both eyes daily as needed (Burning and itching eyes).    . rivaroxaban (XARELTO) 2.5 MG TABS tablet Take 1 tablet (2.5 mg total) by mouth 2 (two) times daily. 60 tablet 11  . Evolocumab (REPATHA) 140 MG/ML SOSY Inject 140 mg into the skin every 14 (fourteen) days. (Patient not taking: Reported on 10/05/2020) 2.1 mL 11   No current facility-administered medications for this visit.    Allergies:   Contrast media [iodinated diagnostic agents], Iohexol, Metformin, Statins, Albuterol, Bextra [valdecoxib], Cefdinir, Cozaar [losartan potassium], Nsaids, Zetia [ezetimibe], Rofecoxib, and Sulfa  antibiotics    Social History:  The patient  reports that she has been smoking cigarettes. She has a 50.00 pack-year smoking history. She has never used smokeless tobacco. She reports that she does not drink alcohol and does not use drugs.   Family History:  [name] family history includes COPD in her brother; Congestive Heart Failure in her brother and daughter; Heart attack in her brother and maternal uncle; Heart disease in her sister; Heart failure in her mother; Hypertension in her sister; Ovarian cancer in her sister; Rheum arthritis in her son.    ROS:  Please see the history of present illness.   Otherwise, review of systems are positive for none.   All other systems are reviewed and negative.    PHYSICAL EXAM: VS:  BP (!) 108/58   Pulse 79   Ht 5' (1.524 m)   Wt 112 lb 9.6 oz (51.1 kg)   SpO2 98%   BMI 21.99 kg/m  , BMI Body mass index is 21.99 kg/m. GEN: Well  nourished, well developed, in no acute distress  HEENT: normal  Neck: no JVD, or masses. Bilateral carotid bruits Cardiac: RRR; no rubs, or gallops,no edema . 3/6 systolic ejection murmur in the aortic area which is mid peaking Respiratory:  clear to auscultation bilaterally, normal work of breathing GI: soft, nontender, nondistended, + BS MS: no deformity or atrophy  Skin: warm and dry, no rash Neuro:  Strength and sensation are intact Psych: euthymic mood, full affect Vascular: Femoral pulses: Slightly diminished bilaterally.  Distal pulses are not palpable.  Purple discoloration of the second third and fourth left toes with extreme tenderness especially the fourth toe  EKG:  EKG is not ordered today.    Recent Labs: 11/19/2019: B Natriuretic Peptide 153.4 06/24/2020: ALT 14 08/24/2020: Hemoglobin 11.7; Platelets 281 09/09/2020: BUN 33; Creatinine, Ser 1.33; Potassium 4.3; Sodium 140    Lipid Panel    Component Value Date/Time   CHOL 157 02/17/2020 1159   TRIG 129 02/17/2020 1159   TRIG 82 06/09/2014 1028   HDL 41 02/17/2020 1159   HDL 57 06/09/2014 1028   CHOLHDL 3.8 02/17/2020 1159   CHOLHDL 3.5 02/06/2013 0932   VLDL 20 02/06/2013 0932   LDLCALC 93 02/17/2020 1159   LDLCALC 131 (H) 06/09/2014 1028      Wt Readings from Last 3 Encounters:  10/05/20 112 lb 9.6 oz (51.1 kg)  09/09/20 112 lb (50.8 kg)  09/01/20 112 lb (50.8 kg)       No flowsheet data found.    ASSESSMENT AND PLAN:  1.  Peripheral arterial disease: Status post bilateral common iliac artery stent placement extending into the distal aorta.  Occluded distal left anterior tibial artery into the dorsalis pedis with small vessel disease in the pedal arch could be responsible for her symptoms.  Unfortunately, no good revascularization options.  She was started on small dose of Xarelto with some improvement in the color.  For the discomfort, I am going to try a small dose gabapentin to see if it helps with  her symptoms. I strongly advised her to quit smoking. If there is worsening, she might require to amputation or transmetatarsal amputation.  2.  subclavian artery stenosis: I do not think this is critical.  She has a palpable radial pulse bilaterally and she denies arm claudication or symptoms of steal syndrome.  3.  Chronic systolic heart failure: Followed by Dr. Haroldine Laws .  She is on optimal medical therapy  4.  Hyperlipidemia: Intolerance to statins and Zetia.  5.  Tobacco use: Unfortunately, she is not able to quit.   Disposition:   FU with me in 2 months  Signed,  Kathlyn Sacramento, MD  10/05/2020 10:00 AM    [place]

## 2020-10-06 ENCOUNTER — Telehealth (HOSPITAL_COMMUNITY): Payer: Self-pay

## 2020-10-06 NOTE — Telephone Encounter (Signed)
Patient called to see if she can get an earlier appointment. She reports that for a little over a week she has been experiencing some chest discomfort that is off and on and last for about 15-20 minutes. She denies chest pain, dizziness, swelling, weight gain. An appointment was scheduled on Monday. I advised patient to call ems if symptoms change.

## 2020-10-07 ENCOUNTER — Other Ambulatory Visit (HOSPITAL_COMMUNITY): Payer: Self-pay

## 2020-10-07 DIAGNOSIS — I5022 Chronic systolic (congestive) heart failure: Secondary | ICD-10-CM

## 2020-10-08 ENCOUNTER — Telehealth (HOSPITAL_COMMUNITY): Payer: Self-pay | Admitting: *Deleted

## 2020-10-08 ENCOUNTER — Encounter (HOSPITAL_COMMUNITY): Payer: Self-pay | Admitting: Internal Medicine

## 2020-10-08 ENCOUNTER — Other Ambulatory Visit: Payer: Self-pay

## 2020-10-08 ENCOUNTER — Ambulatory Visit (HOSPITAL_COMMUNITY)
Admission: RE | Admit: 2020-10-08 | Discharge: 2020-10-08 | Disposition: A | Discharge status: Home / Self Care | Payer: Medicare Other | Source: Ambulatory Visit | Attending: Internal Medicine | Admitting: Internal Medicine

## 2020-10-08 VITALS — BP 112/68 | HR 86 | Wt 112.8 lb

## 2020-10-08 DIAGNOSIS — I2510 Atherosclerotic heart disease of native coronary artery without angina pectoris: Secondary | ICD-10-CM | POA: Insufficient documentation

## 2020-10-08 DIAGNOSIS — Z794 Long term (current) use of insulin: Secondary | ICD-10-CM | POA: Insufficient documentation

## 2020-10-08 DIAGNOSIS — Z7951 Long term (current) use of inhaled steroids: Secondary | ICD-10-CM | POA: Insufficient documentation

## 2020-10-08 DIAGNOSIS — I5042 Chronic combined systolic (congestive) and diastolic (congestive) heart failure: Secondary | ICD-10-CM | POA: Insufficient documentation

## 2020-10-08 DIAGNOSIS — E785 Hyperlipidemia, unspecified: Secondary | ICD-10-CM | POA: Diagnosis not present

## 2020-10-08 DIAGNOSIS — E1151 Type 2 diabetes mellitus with diabetic peripheral angiopathy without gangrene: Secondary | ICD-10-CM | POA: Insufficient documentation

## 2020-10-08 DIAGNOSIS — I739 Peripheral vascular disease, unspecified: Secondary | ICD-10-CM | POA: Diagnosis not present

## 2020-10-08 DIAGNOSIS — I272 Pulmonary hypertension, unspecified: Secondary | ICD-10-CM | POA: Diagnosis not present

## 2020-10-08 DIAGNOSIS — Z720 Tobacco use: Secondary | ICD-10-CM

## 2020-10-08 DIAGNOSIS — Z79899 Other long term (current) drug therapy: Secondary | ICD-10-CM | POA: Insufficient documentation

## 2020-10-08 DIAGNOSIS — J449 Chronic obstructive pulmonary disease, unspecified: Secondary | ICD-10-CM | POA: Insufficient documentation

## 2020-10-08 DIAGNOSIS — Z7982 Long term (current) use of aspirin: Secondary | ICD-10-CM | POA: Diagnosis not present

## 2020-10-08 DIAGNOSIS — I5022 Chronic systolic (congestive) heart failure: Secondary | ICD-10-CM | POA: Diagnosis not present

## 2020-10-08 DIAGNOSIS — E1122 Type 2 diabetes mellitus with diabetic chronic kidney disease: Secondary | ICD-10-CM | POA: Insufficient documentation

## 2020-10-08 DIAGNOSIS — Z7901 Long term (current) use of anticoagulants: Secondary | ICD-10-CM | POA: Diagnosis not present

## 2020-10-08 DIAGNOSIS — F17210 Nicotine dependence, cigarettes, uncomplicated: Secondary | ICD-10-CM | POA: Diagnosis not present

## 2020-10-08 DIAGNOSIS — R0789 Other chest pain: Secondary | ICD-10-CM

## 2020-10-08 DIAGNOSIS — I428 Other cardiomyopathies: Secondary | ICD-10-CM | POA: Insufficient documentation

## 2020-10-08 DIAGNOSIS — I447 Left bundle-branch block, unspecified: Secondary | ICD-10-CM | POA: Diagnosis not present

## 2020-10-08 LAB — BASIC METABOLIC PANEL
Anion gap: 10 (ref 5–15)
BUN: 33 mg/dL — ABNORMAL HIGH (ref 8–23)
CO2: 27 mmol/L (ref 22–32)
Calcium: 9.4 mg/dL (ref 8.9–10.3)
Chloride: 100 mmol/L (ref 98–111)
Creatinine, Ser: 1.35 mg/dL — ABNORMAL HIGH (ref 0.44–1.00)
GFR, Estimated: 40 mL/min — ABNORMAL LOW (ref >60)
Glucose, Bld: 206 mg/dL — ABNORMAL HIGH (ref 70–99)
Potassium: 4.3 mmol/L (ref 3.5–5.1)
Sodium: 137 mmol/L (ref 135–145)

## 2020-10-08 LAB — PROTIME-INR
INR: 1.3 — ABNORMAL HIGH (ref 0.8–1.2)
Prothrombin Time: 15.8 seconds — ABNORMAL HIGH (ref 11.4–15.2)

## 2020-10-08 LAB — CBC
HCT: 31.1 % — ABNORMAL LOW (ref 36.0–46.0)
Hemoglobin: 10.5 g/dL — ABNORMAL LOW (ref 12.0–15.0)
MCH: 36.2 pg — ABNORMAL HIGH (ref 26.0–34.0)
MCHC: 33.8 g/dL (ref 30.0–36.0)
MCV: 107.2 fL — ABNORMAL HIGH (ref 80.0–100.0)
Platelets: 282 10*3/uL (ref 150–400)
RBC: 2.9 MIL/uL — ABNORMAL LOW (ref 3.87–5.11)
RDW: 18.7 % — ABNORMAL HIGH (ref 11.5–15.5)
WBC: 11.4 10*3/uL — ABNORMAL HIGH (ref 4.0–10.5)
nRBC: 0.4 % — ABNORMAL HIGH (ref 0.0–0.2)

## 2020-10-08 LAB — DIGOXIN LEVEL: Digoxin Level: 0.8 ng/mL (ref 0.8–2.0)

## 2020-10-08 LAB — URIC ACID: Uric Acid, Serum: 9 mg/dL — ABNORMAL HIGH (ref 2.5–7.1)

## 2020-10-08 MED ORDER — PREDNISONE 50 MG PO TABS: ORAL_TABLET | ORAL | 0 refills | Status: DC

## 2020-10-08 MED ORDER — NITROGLYCERIN 0.3 MG SL SUBL: 0.3000 mg | SUBLINGUAL_TABLET | SUBLINGUAL | 3 refills | Status: DC | PRN

## 2020-10-08 NOTE — Progress Notes (Signed)
ADVANCED HF CLINIC NOTE  Date:  10/08/2020   PCP:  Dettinger, Fransisca Kaufmann, MD  Cardiologist:  No primary care provider on file.  Electrophysiologist:  None   Referring Provider: Melina Copa PA-C  Chief Complaint:  HF  History of Present Illness:   Hannah French is a 80 y.o. 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   She was seen in the office on 08/26/2018 by Dr. Burt Knack and was noted to have shortness of breath felt to be related to COPD and URI. She did have a progression on her EKG from previous IVCD to left bundle branch block. An echo was done which showed a decline in LV function with EF 30-35% with diffuse hypokinesis (prior was normal). With new LBBB and significant change in LV function she was sent for right and left heart cath which was done on 09/16/2018 which showed nonobstructive CAD with diffuse luminal irregularities involving the left main, left circumflex and RCA. There was 40-50% calcific stenosis of the proximal LAD. There was mild pulmonary hypertension and mild elevation in LVEDP with preserved cardiac output. Medical therapy was recommended.. She is not able to take statin drugs or aspirin because of intolerance to both medicines. She has stated that she is a "free bleeder" and cannot tolerate any antiplatelet therapy.   Her medical therapy has been limited by low BP and dizziness (beta blocker reduced, losartan stopped). Corlanor was started for elevated HR and soft BP but not yet approved, F/u echo 04/28/19 showed EF 25-30%, diffuse HK, Grade II DD,  mildly dilated LA, mild MR.   I saw her for the first time in the HF Clinic in 8/20. Was doing fairly  well. Felt SOB mostly due to COPD. Hall walk with sats 93-96%. Unable to titrate HF meds due to low BP and dizziness  Underwent stenting of bilateral common iliac artery stent placement extending into the distal aorta on 12/31/19  Returned for f/u 8/21. Over past few weeks has been having pain and swelling in the right groin. No CP. Chronically SOB and exhausted. No claudication. No LE wounds. LE U/S ordered and normal.  Today she returns for HF follow up. Complaining of intermittent left upper chest tightness for past 2 weeks, 4-5 x/day lasting few minutes. Tightness does not radiate. No recent arm/shoulder trauma. Has not taken any Nitro. Dr. Fletcher Anon recently started her on low-dose Xarelto for worsening pain in toe. Overall feels about the same, able to do housework. SOB with minimal activity. Smoking 1 ppd. Denies increasing SOB, CP, dizziness, edema, or PND/Orthopnea. Appetite ok. No fever or chills. Weight at home ~112 pounds. Taking all medications.   Cardiac Studies: - [facility] 12/19: Prox LAD lesion is 40% stenosed. Nonobstructed CAD, severe LV systolic dysfunction, mild pulm hytertension and mild elevated LVEDP, preserved CO-->Recommend: Medical therapy for congestive heart failure and nonobstructive CAD.  Fick CO: 5.29 L/min Fick CI: 7.07 L/min RV systolic: 42 mm Hg RV diastolic: 1 mm Hg RV EDP: 7 mm Hg PA systolic: 40 mm Hg PA diastolic: 17 mm Hg PA mean: 28 mm Hg LV systolic: 867 mm Hg LV diastolic: 6 mm Hg LVEDP: 10 mm Hg  - Echo 12/19 EF: 30-35% with diffuse hypokinesis. Cath  12/19 nonobstructive CAD. - Echo 8/20 EF: 25-30% with Grade II DD.  PFTs 06/11/19  FEV1 0.96L (55%) FVC 1.61L (68%) Ratio60% FEF25-75  0.40 (29%) DLCO 50%  Past Medical History:  Diagnosis Date  . Abdominal bruit   . Bladder cancer ([place])   . CAD (coronary artery disease)    a. nonobstructive by cath 08/2018.  . Carotid bruit   . Chronic combined systolic and diastolic CHF (congestive heart  failure) ([place])   . CKD (chronic kidney disease), stage II   . Congestive heart failure (CHF) ([place])   . Diabetes mellitus    Type II  . Dyslipidemia   . Heart murmur   . Mild pulmonary hypertension ([place])   . Mitral valve prolapse    a. not seen on most recent echoes. Mild MR now.  Marland Kitchen NICM (nonischemic cardiomyopathy) ([place])   . Osteoporosis   . Pancreatitis, acute   . Pseudogout   . PVD (peripheral vascular disease) ([place])    a. 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  . Sinus tachycardia   . Tobacco abuse     Past Surgical History:  Procedure Laterality Date  . ABDOMINAL AORTOGRAM W/LOWER EXTREMITY N/A 12/31/2019   Procedure: ABDOMINAL AORTOGRAM W/LOWER EXTREMITY;  Surgeon: Wellington Hampshire, MD;  Location: [place] [facility];  Service: Cardiovascular;  Laterality: N/A;  . ABDOMINAL AORTOGRAM W/LOWER EXTREMITY N/A 09/01/2020   Procedure: ABDOMINAL AORTOGRAM W/LOWER EXTREMITY;  Surgeon: Wellington Hampshire, MD;  Location: [place] [facility];  Service: Cardiovascular;  Laterality: N/A;  . APPENDECTOMY    . Hysterectomy-type unspecified    . LAPAROSCOPIC CHOLECYSTECTOMY  2009  . PERIPHERAL VASCULAR INTERVENTION  12/31/2019   Procedure: PERIPHERAL VASCULAR INTERVENTION;  Surgeon: Wellington Hampshire, MD;  Location: [place] [facility];  Service: Cardiovascular;;  Bilateral [place]  . RIGHT/LEFT HEART CATH AND CORONARY ANGIOGRAPHY N/A 09/16/2018   Procedure: RIGHT/LEFT HEART CATH AND CORONARY ANGIOGRAPHY;  Surgeon: Sherren Mocha, MD;  Location: [facility];  Service: Cardiovascular;  Laterality: N/A;    Current Medications: Current Meds  Medication Sig  . albuterol (VENTOLIN HFA) 108 (90 Base) MCG/ACT inhaler Inhale 2 puffs into the lungs every 6 (six) hours as needed for wheezing or shortness of breath.  Marland Kitchen aspirin EC 81 MG tablet Take 1 tablet (81 mg total) by mouth daily.  . blood glucose meter kit and supplies Use up to four times daily  as directed  . Cholecalciferol (VITAMIN D) 50 MCG (2000 UT) tablet Take 4,000 Units by mouth daily.   . cyclobenzaprine (FLEXERIL) 10 MG tablet TAKE 1 TABLET THREE TIMES DAILY FOR MUSCLE SPASM (Patient taking differently: Take 10 mg by mouth daily as needed for muscle spasms.)  . digoxin (LANOXIN) 0.125 MG tablet TAKE (1/2) TABLET DAILY.  Marland Kitchen empagliflozin (JARDIANCE) 10 MG TABS tablet Take 1 tablet (10 mg total) by mouth daily before breakfast.  . Evolocumab (REPATHA) 140 MG/ML SOSY Inject 140 mg into the skin every 14 (fourteen) days.  . ferrous sulfate 325 (65 FE) MG tablet Take 325 mg by mouth daily with breakfast.  . Fluticasone-Salmeterol (ADVAIR) 100-50 MCG/DOSE AEPB Inhale 1 puff into the lungs 2 (two) times daily.  . furosemide (LASIX) 20 MG tablet Take 2 tablets daily. You may take an extra dose for increase weight gain orswelling  . gabapentin (NEURONTIN) 300 MG capsule Take 1 capsule (300 mg total) by mouth 3 (three) times daily.  Marland Kitchen glimepiride (AMARYL) 2 MG tablet Take 1-2  tablets (2-4 mg total) by mouth daily with breakfast.  . glucose blood (ONETOUCH VERIO) test strip TEst BS BID and as needed Dx E11.9  . insulin degludec (TRESIBA FLEXTOUCH) 100 UNIT/ML FlexTouch Pen Inject 10 Units into the skin daily.  . Insulin Pen Needle (B-D UF III MINI PEN NEEDLES) 31G X 5 MM MISC 1 each by Does not apply route 4 (four) times daily.  . Lancets (ONETOUCH DELICA PLUS ZOXWRU04V) MISC CHECK GLUCOSE UP TO 4 TIMES DAILY AS DIRECTED Dx E11.9  . metoprolol succinate (TOPROL-XL) 25 MG 24 hr tablet Take 1 tablet (25 mg total) by mouth daily.  . Naphazoline-Pheniramine (OPCON-A) 0.027-0.315 % SOLN Place 1 drop into both eyes daily as needed (Burning and itching eyes).  . rivaroxaban (XARELTO) 2.5 MG TABS tablet Take 1 tablet (2.5 mg total) by mouth 2 (two) times daily.     Allergies:   Contrast media [iodinated diagnostic agents], Iohexol, Metformin, Statins, Albuterol, Bextra [valdecoxib], Cefdinir,  Cozaar [losartan potassium], Nsaids, Zetia [ezetimibe], Rofecoxib, and Sulfa antibiotics   Social History   Socioeconomic History  . Marital status: Divorced    Spouse name: Not on file  . Number of children: 2  . Years of education: Some College   . Highest education level: Some college, no degree  Occupational History  . Occupation: Retired  Tobacco Use  . Smoking status: Current Every Day Smoker    Packs/day: 1.00    Years: 50.00    Pack years: 50.00    Types: Cigarettes  . Smokeless tobacco: Never Used  Vaping Use  . Vaping Use: Never used  Substance and Sexual Activity  . Alcohol use: No  . Drug use: No  . Sexual activity: Not Currently  Other Topics Concern  . Not on file  Social History Narrative  . Not on file   Social Determinants of Health   Financial Resource Strain: Not on file  Food Insecurity: Not on file  Transportation Needs: Not on file  Physical Activity: Not on file  Stress: Not on file  Social Connections: Not on file     Family History:  [name] family history includes COPD in her brother; Congestive Heart Failure in her brother and daughter; Heart attack in her brother and maternal uncle; Heart disease in her sister; Heart failure in her mother; Hypertension in her sister; Ovarian cancer in her sister; Rheum arthritis in her son. There is no history of Coronary artery disease.   EKGs/Labs/Other Studies Reviewed:    Studies reviewed were summarized above.   EKG:  SR 78 bpm (personally reviewed).  Recent Labs: 11/19/2019: B Natriuretic Peptide 153.4 06/24/2020: ALT 14 08/24/2020: Hemoglobin 11.7; Platelets 281 09/09/2020: BUN 33; Creatinine, Ser 1.33; Potassium 4.3; Sodium 140  Recent Lipid Panel    Component Value Date/Time   CHOL 157 02/17/2020 1159   TRIG 129 02/17/2020 1159   TRIG 82 06/09/2014 1028   HDL 41 02/17/2020 1159   HDL 57 06/09/2014 1028   CHOLHDL 3.8 02/17/2020 1159   CHOLHDL 3.5 02/06/2013 0932   VLDL 20  02/06/2013 0932   LDLCALC 93 02/17/2020 1159   LDLCALC 131 (H) 06/09/2014 1028    PHYSICAL EXAM:    VS:  BP 112/68   Pulse 86   Wt 51.2 kg   SpO2 96%   BMI 22.03 kg/m   BMI: Body mass index is 22.03 kg/m.  General:  Elderly, frail, in wheelchair. NAD. No resp difficulty. HEENT: extremely HOH. Neck: Supple. No JVD. Carotids 2+  bilat; no bruits. No lymphadenopathy or thryomegaly appreciated. Cor: PMI nondisplaced. Regular rate & rhythm. Distant, No rubs, gallops, III/VI SEM RUSB Lungs: faint wheezes through out upper lobes, markedly diminished throughout all lung fields. Abdomen: Soft, nontender, nondistended. No hepatosplenomegaly. No bruits or masses. Good bowel sounds. Extremities: No cyanosis, clubbing, rash, edema; Left DP pulse dopplerable, 4th digit, plantar surface purple & cool, bluish toes. Neuro: alert & oriented x 3, cranial nerves grossly intact. Moves all 4 extremities w/o difficulty. Affect pleasant.  ECG: SR 78 bpm (personally reviewed)  Wt Readings from Last 3 Encounters:  10/08/20 51.2 kg  10/05/20 51.1 kg  09/09/20 50.8 kg     ASSESSMENT & PLAN:   1. Angina, unstable - LHC 12/19 non-obs CAD--> medical management but patient refused statins.  - Current long-term smoker + symptoms worrisome for worsening CAD. - Schedule LHC next week, Stop Xarelto 1 day before procedure. - Start Imdur 30 mg daily. - Will call in prn SL Nitro. Strict instructions to call 911 if chest tightness becomes worse. - ECG today SR.  2. Chronic systolic HF  - Due to NICM (? LBBB but not very wide)  - Echo 12/19 EF 30-35% with diffuse hypokinesis. Cath 12/19 nonobstructive CAD. - Echo 8/20 EF 25-30% with Grade II DD. - Stable NYHA IIIB compounded by severe PAD and end-stage COPD. - Volume status looks good on lasix 40 daily. Will continue. - Intolerant of meds due to low BP and dizziness (She does have a BP differential between arms so should likely be using the right arm to obtain  accurate readings) - Continue Toprol 25 daily. - Continue digoxin 0.125 daily. - Continue Jardiance. - Losartan stopped due to low BP and hyperkalemia. - Corlanor ordered but did not get approved. - PFTs quite severe and likely major limiting factor (with PAD). - Not interested in ICD or CRT. (cancelled appt with EP). - Repeat echo scheduled for 10/18/20. - BMET and digoxin level today.  3. 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 Fletcher Anon - She is s/p stenting of bilateral common iliac artery stent placement extending into the distal aorta.  - Worsening pain on left 4th toe. Taking low-dose gabapentin. - Continue xarelto 2.5 mg daily.  - ? vascular surgery referral. - patient requesting uric acid level today, says she has gout in knees, will order.  4. CAD - Nonobstructive by prior cath 12/19. Refuses statins. On ASA. - With current chest tightness, and refusal of medical management of known CAD, schedule coronary angiography next week. - Hold Xarelto 1 day before procedure.   5. COPD with ongoing tobacco use. - Still smoking 1 ppd, she is unable quit - PFTs 9/20 with severe COPD. - Encouraged her to quit.   6. DM2 - Continue Jardiance. - No GU symptoms. - Followed by PCP.  LHC next week and echo scheduled for 10/18/20. Follow up with Dr. Haroldine Laws pending test results.  Frankey Poot, FNP  10/08/2020 10:55 AM     [facility] [place], [place], [place]  [address] Phone: [phone]; Fax: [phone]   Patient seen and examined with the above-signed Advanced Practice Provider and/or Housestaff. I personally reviewed laboratory data, imaging studies and relevant notes. I independently examined the patient and formulated the important aspects of the plan. I have edited the note to reflect any of my changes or salient points. I have personally discussed the plan with the  patient and/or  family.  80 y/o with COPD, PAD (with ongoing tobacco use), chronic systolic HF with EF 74-08% presents for unscheduled visit due to worsening CP.   Says CP started 1-2 weeks ago. Now having daily episodes of left-sided chest pressure last 1-15 minutes. No clear exertional trigger. ECG ok.   Cath 12/19 LAD 40%   General:  Elderly No resp difficulty HEENT: normal Neck: supple. JVP 7Carotids 2+ bilat; no bruits. No lymphadenopathy or thryomegaly appreciated. Cor: PMI nondisplaced. Regular rate & rhythm. No rubs, gallops or murmurs. Lungs: clear with markedly decreased BS throughout Abdomen: soft, nontender, nondistended. No hepatosplenomegaly. No bruits or masses. Good bowel sounds. Extremities: no cyanosis, clubbing, rash, edema bluish toes on left  Neuro: alert & orientedx3, cranial nerves grossly intact. moves all 4 extremities w/o difficulty. Affect pleasant  CP concerning for ischemia CP. ECG ok. Had cath in 12/19 with LAD 40% but has been consistently smoking since.   Will plan R/L cath next week to evaluate for CAD and also assess filling pressures to make sure symptoms not due to worsening HF. Wil start Imdur. Knows to call 911 if CP rose over the weekend.   Glori Bickers, MD  7:50 PM

## 2020-10-08 NOTE — Patient Instructions (Addendum)
It was great to see you today! No medication changes are needed at this time.   Labs today We will only contact you if something comes back abnormal or we need to make some changes. Otherwise no news is good news!  Your physician recommends that you schedule a follow-up appointment in: 3 months with Dr Haroldine Laws  If you have any questions or concerns before your next appointment please send [place] a message through [place] or call our office at [phone].    TO LEAVE A MESSAGE FOR THE NURSE SELECT OPTION 2, PLEASE LEAVE A MESSAGE INCLUDING: . YOUR NAME . DATE OF BIRTH . CALL BACK NUMBER . REASON FOR CALL**this is important as we prioritize the call backs  YOU WILL RECEIVE A CALL BACK THE SAME DAY AS LONG AS YOU CALL BEFORE 4:00 PM     You are scheduled for a Cardiac Catheterization on Wednesday, January 19 with Dr. Glori Bickers.  1. Please arrive at the [facility] (Main Entrance A) at [facility]: [address] [place], [place] [address] at 9:30 AM (This time is two hours before your procedure to ensure your preparation). Free valet parking service is available.   Special note: Every effort is made to have your procedure done on time. Please understand that emergencies sometimes delay scheduled procedures.  2. Diet: Do not eat solid foods after midnight.  The patient may have clear liquids until 5am upon the day of the procedure.  3. Labs: pre procedure labs done 10/08/2020  You will need a pre procedure COVID test    WHEN: 10/09/2020 or 10/11/20 WHERE: COVID Test Site [address] [place],  [address]  This is a drive thru testing site, you will remain in your car. Be sure to get in the line FOR PROCEDURES Once you have been swabbed you will need to remain home in quarantine until you return for your procedure.   4. Medication instructions in preparation for your procedure:   Contrast Allergy: Yes, Please take Prednisone 50mg  by mouth at: Thirteen  hours prior to cath 10:00pm on Tuesday Seven hours prior to cath 4:00am on Wednesday And prior to leaving home please take last dose of Prednisone 50mg  and Benadryl 50mg  by mouth.      Stop taking Xarelto (Rivaroxaban) on Tuesday, January 13.  Stop taking, Lasix (Furosemide)  Wednesday, January 14,      On the morning of your procedure, take your Aspirin and any morning medicines NOT listed above.  You may use sips of water.  5. Plan for one night stay--bring personal belongings. 6. Bring a current list of your medications and current insurance cards. 7. You MUST have a responsible person to drive you home. 8. Someone MUST be with you the first 24 hours after you arrive home or your discharge will be delayed. 9. Please wear clothes that are easy to get on and off and wear slip-on shoes.  Thank you for allowing [place] to care for you!   -- [facility]

## 2020-10-08 NOTE — H&P (View-Only) (Signed)
ADVANCED HF CLINIC NOTE  Date:  10/08/2020   PCP:  Dettinger, Fransisca Kaufmann, MD  Cardiologist:  No primary care provider on file.  Electrophysiologist:  None   Referring Provider: Melina Copa PA-C  Chief Complaint:  HF  History of Present Illness:   Hannah French is a 80 y.o. 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   She was seen in the office on 08/26/2018 by Dr. Burt Knack and was noted to have shortness of breath felt to be related to COPD and URI. She did have a progression on her EKG from previous IVCD to left bundle branch block. An echo was done which showed a decline in LV function with EF 30-35% with diffuse hypokinesis (prior was normal). With new LBBB and significant change in LV function she was sent for right and left heart cath which was done on 09/16/2018 which showed nonobstructive CAD with diffuse luminal irregularities involving the left main, left circumflex and RCA. There was 40-50% calcific stenosis of the proximal LAD. There was mild pulmonary hypertension and mild elevation in LVEDP with preserved cardiac output. Medical therapy was recommended.. She is not able to take statin drugs or aspirin because of intolerance to both medicines. She has stated that she is a "free bleeder" and cannot tolerate any antiplatelet therapy.   Her medical therapy has been limited by low BP and dizziness (beta blocker reduced, losartan stopped). Corlanor was started for elevated HR and soft BP but not yet approved, F/u echo 04/28/19 showed EF 25-30%, diffuse HK, Grade II DD,  mildly dilated LA, mild MR.   I saw her for the first time in the HF Clinic in 8/20. Was doing fairly  well. Felt SOB mostly due to COPD. Hall walk with sats 93-96%. Unable to titrate HF meds due to low BP and dizziness  Underwent stenting of bilateral common iliac artery stent placement extending into the distal aorta on 12/31/19  Returned for f/u 8/21. Over past few weeks has been having pain and swelling in the right groin. No CP. Chronically SOB and exhausted. No claudication. No LE wounds. LE U/S ordered and normal.  Today she returns for HF follow up. Complaining of intermittent left upper chest tightness for past 2 weeks, 4-5 x/day lasting few minutes. Tightness does not radiate. No recent arm/shoulder trauma. Has not taken any Nitro. Dr. Fletcher Anon recently started her on low-dose Xarelto for worsening pain in toe. Overall feels about the same, able to do housework. SOB with minimal activity. Smoking 1 ppd. Denies increasing SOB, CP, dizziness, edema, or PND/Orthopnea. Appetite ok. No fever or chills. Weight at home ~112 pounds. Taking all medications.   Cardiac Studies: - [facility] 12/19: Prox LAD lesion is 40% stenosed. Nonobstructed CAD, severe LV systolic dysfunction, mild pulm hytertension and mild elevated LVEDP, preserved CO-->Recommend: Medical therapy for congestive heart failure and nonobstructive CAD.  Fick CO: 5.29 L/min Fick CI: 7.07 L/min RV systolic: 42 mm Hg RV diastolic: 1 mm Hg RV EDP: 7 mm Hg PA systolic: 40 mm Hg PA diastolic: 17 mm Hg PA mean: 28 mm Hg LV systolic: 867 mm Hg LV diastolic: 6 mm Hg LVEDP: 10 mm Hg  - Echo 12/19 EF: 30-35% with diffuse hypokinesis. Cath  12/19 nonobstructive CAD. - Echo 8/20 EF: 25-30% with Grade II DD.  PFTs 06/11/19  FEV1 0.96L (55%) FVC 1.61L (68%) Ratio60% FEF25-75  0.40 (29%) DLCO 50%  Past Medical History:  Diagnosis Date  . Abdominal bruit   . Bladder cancer (HCC)   . CAD (coronary artery disease)    a. nonobstructive by cath 08/2018.  . Carotid bruit   . Chronic combined systolic and diastolic CHF (congestive heart  failure) (HCC)   . CKD (chronic kidney disease), stage II   . Congestive heart failure (CHF) (HCC)   . Diabetes mellitus    Type II  . Dyslipidemia   . Heart murmur   . Mild pulmonary hypertension (HCC)   . Mitral valve prolapse    a. not seen on most recent echoes. Mild MR now.  . NICM (nonischemic cardiomyopathy) (HCC)   . Osteoporosis   . Pancreatitis, acute   . Pseudogout   . PVD (peripheral vascular disease) (HCC)    a. 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  . Sinus tachycardia   . Tobacco abuse     Past Surgical History:  Procedure Laterality Date  . ABDOMINAL AORTOGRAM W/LOWER EXTREMITY N/A 12/31/2019   Procedure: ABDOMINAL AORTOGRAM W/LOWER EXTREMITY;  Surgeon: Arida, Muhammad A, MD;  Location: [facility];  Service: Cardiovascular;  Laterality: N/A;  . ABDOMINAL AORTOGRAM W/LOWER EXTREMITY N/A 09/01/2020   Procedure: ABDOMINAL AORTOGRAM W/LOWER EXTREMITY;  Surgeon: Arida, Muhammad A, MD;  Location: [facility];  Service: Cardiovascular;  Laterality: N/A;  . APPENDECTOMY    . Hysterectomy-type unspecified    . LAPAROSCOPIC CHOLECYSTECTOMY  2009  . PERIPHERAL VASCULAR INTERVENTION  12/31/2019   Procedure: PERIPHERAL VASCULAR INTERVENTION;  Surgeon: Arida, Muhammad A, MD;  Location: [facility];  Service: Cardiovascular;;  Bilateral Iliacs  . RIGHT/LEFT HEART CATH AND CORONARY ANGIOGRAPHY N/A 09/16/2018   Procedure: RIGHT/LEFT HEART CATH AND CORONARY ANGIOGRAPHY;  Surgeon: Cooper, Michael, MD;  Location: [facility];  Service: Cardiovascular;  Laterality: N/A;    Current Medications: Current Meds  Medication Sig  . albuterol (VENTOLIN HFA) 108 (90 Base) MCG/ACT inhaler Inhale 2 puffs into the lungs every 6 (six) hours as needed for wheezing or shortness of breath.  . aspirin EC 81 MG tablet Take 1 tablet (81 mg total) by mouth daily.  . blood glucose meter kit and supplies Use up to four times daily  as directed  . Cholecalciferol (VITAMIN D) 50 MCG (2000 UT) tablet Take 4,000 Units by mouth daily.   . cyclobenzaprine (FLEXERIL) 10 MG tablet TAKE 1 TABLET THREE TIMES DAILY FOR MUSCLE SPASM (Patient taking differently: Take 10 mg by mouth daily as needed for muscle spasms.)  . digoxin (LANOXIN) 0.125 MG tablet TAKE (1/2) TABLET DAILY.  . empagliflozin (JARDIANCE) 10 MG TABS tablet Take 1 tablet (10 mg total) by mouth daily before breakfast.  . Evolocumab (REPATHA) 140 MG/ML SOSY Inject 140 mg into the skin every 14 (fourteen) days.  . ferrous sulfate 325 (65 FE) MG tablet Take 325 mg by mouth daily with breakfast.  . Fluticasone-Salmeterol (ADVAIR) 100-50 MCG/DOSE AEPB Inhale 1 puff into the lungs 2 (two) times daily.  . furosemide (LASIX) 20 MG tablet Take 2 tablets daily. You may take an extra dose for increase weight gain orswelling  . gabapentin (NEURONTIN) 300 MG capsule Take 1 capsule (300 mg total) by mouth 3 (three) times daily.  . glimepiride (AMARYL) 2 MG tablet Take 1-2   tablets (2-4 mg total) by mouth daily with breakfast.  . glucose blood (ONETOUCH VERIO) test strip TEst BS BID and as needed Dx E11.9  . insulin degludec (TRESIBA FLEXTOUCH) 100 UNIT/ML FlexTouch Pen Inject 10 Units into the skin daily.  . Insulin Pen Needle (B-D UF III MINI PEN NEEDLES) 31G X 5 MM MISC 1 each by Does not apply route 4 (four) times daily.  . Lancets (ONETOUCH DELICA PLUS LANCET33G) MISC CHECK GLUCOSE UP TO 4 TIMES DAILY AS DIRECTED Dx E11.9  . metoprolol succinate (TOPROL-XL) 25 MG 24 hr tablet Take 1 tablet (25 mg total) by mouth daily.  . Naphazoline-Pheniramine (OPCON-A) 0.027-0.315 % SOLN Place 1 drop into both eyes daily as needed (Burning and itching eyes).  . rivaroxaban (XARELTO) 2.5 MG TABS tablet Take 1 tablet (2.5 mg total) by mouth 2 (two) times daily.     Allergies:   Contrast media [iodinated diagnostic agents], Iohexol, Metformin, Statins, Albuterol, Bextra [valdecoxib], Cefdinir,  Cozaar [losartan potassium], Nsaids, Zetia [ezetimibe], Rofecoxib, and Sulfa antibiotics   Social History   Socioeconomic History  . Marital status: Divorced    Spouse name: Not on file  . Number of children: 2  . Years of education: Some College   . Highest education level: Some college, no degree  Occupational History  . Occupation: Retired  Tobacco Use  . Smoking status: Current Every Day Smoker    Packs/day: 1.00    Years: 50.00    Pack years: 50.00    Types: Cigarettes  . Smokeless tobacco: Never Used  Vaping Use  . Vaping Use: Never used  Substance and Sexual Activity  . Alcohol use: No  . Drug use: No  . Sexual activity: Not Currently  Other Topics Concern  . Not on file  Social History Narrative  . Not on file   Social Determinants of Health   Financial Resource Strain: Not on file  Food Insecurity: Not on file  Transportation Needs: Not on file  Physical Activity: Not on file  Stress: Not on file  Social Connections: Not on file     Family History:  [name] family history includes COPD in her brother; Congestive Heart Failure in her brother and daughter; Heart attack in her brother and maternal uncle; Heart disease in her sister; Heart failure in her mother; Hypertension in her sister; Ovarian cancer in her sister; Rheum arthritis in her son. There is no history of Coronary artery disease.   EKGs/Labs/Other Studies Reviewed:    Studies reviewed were summarized above.   EKG:  SR 78 bpm (personally reviewed).  Recent Labs: 11/19/2019: B Natriuretic Peptide 153.4 06/24/2020: ALT 14 08/24/2020: Hemoglobin 11.7; Platelets 281 09/09/2020: BUN 33; Creatinine, Ser 1.33; Potassium 4.3; Sodium 140  Recent Lipid Panel    Component Value Date/Time   CHOL 157 02/17/2020 1159   TRIG 129 02/17/2020 1159   TRIG 82 06/09/2014 1028   HDL 41 02/17/2020 1159   HDL 57 06/09/2014 1028   CHOLHDL 3.8 02/17/2020 1159   CHOLHDL 3.5 02/06/2013 0932   VLDL 20  02/06/2013 0932   LDLCALC 93 02/17/2020 1159   LDLCALC 131 (H) 06/09/2014 1028    PHYSICAL EXAM:    VS:  BP 112/68   Pulse 86   Wt 51.2 kg   SpO2 96%   BMI 22.03 kg/m   BMI: Body mass index is 22.03 kg/m.  General:  Elderly, frail, in wheelchair. NAD. No resp difficulty. HEENT: extremely HOH. Neck: Supple. No JVD. Carotids 2+   bilat; no bruits. No lymphadenopathy or thryomegaly appreciated. Cor: PMI nondisplaced. Regular rate & rhythm. Distant, No rubs, gallops, III/VI SEM RUSB Lungs: faint wheezes through out upper lobes, markedly diminished throughout all lung fields. Abdomen: Soft, nontender, nondistended. No hepatosplenomegaly. No bruits or masses. Good bowel sounds. Extremities: No cyanosis, clubbing, rash, edema; Left DP pulse dopplerable, 4th digit, plantar surface purple & cool, bluish toes. Neuro: alert & oriented x 3, cranial nerves grossly intact. Moves all 4 extremities w/o difficulty. Affect pleasant.  ECG: SR 78 bpm (personally reviewed)  Wt Readings from Last 3 Encounters:  10/08/20 51.2 kg  10/05/20 51.1 kg  09/09/20 50.8 kg     ASSESSMENT & PLAN:   1. Angina, unstable - LHC 12/19 non-obs CAD--> medical management but patient refused statins.  - Current long-term smoker + symptoms worrisome for worsening CAD. - Schedule LHC next week, Stop Xarelto 1 day before procedure. - Start Imdur 30 mg daily. - Will call in prn SL Nitro. Strict instructions to call 911 if chest tightness becomes worse. - ECG today SR.  2. Chronic systolic HF  - Due to NICM (? LBBB but not very wide)  - Echo 12/19 EF 30-35% with diffuse hypokinesis. Cath 12/19 nonobstructive CAD. - Echo 8/20 EF 25-30% with Grade II DD. - Stable NYHA IIIB compounded by severe PAD and end-stage COPD. - Volume status looks good on lasix 40 daily. Will continue. - Intolerant of meds due to low BP and dizziness (She does have a BP differential between arms so should likely be using the right arm to obtain  accurate readings) - Continue Toprol 25 daily. - Continue digoxin 0.125 daily. - Continue Jardiance. - Losartan stopped due to low BP and hyperkalemia. - Corlanor ordered but did not get approved. - PFTs quite severe and likely major limiting factor (with PAD). - Not interested in ICD or CRT. (cancelled appt with EP). - Repeat echo scheduled for 10/18/20. - BMET and digoxin level today.  3. 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 Arida - She is s/p stenting of bilateral common iliac artery stent placement extending into the distal aorta.  - Worsening pain on left 4th toe. Taking low-dose gabapentin. - Continue xarelto 2.5 mg daily.  - ? vascular surgery referral. - patient requesting uric acid level today, says she has gout in knees, will order.  4. CAD - Nonobstructive by prior cath 12/19. Refuses statins. On ASA. - With current chest tightness, and refusal of medical management of known CAD, schedule coronary angiography next week. - Hold Xarelto 1 day before procedure.   5. COPD with ongoing tobacco use. - Still smoking 1 ppd, she is unable quit - PFTs 9/20 with severe COPD. - Encouraged her to quit.   6. DM2 - Continue Jardiance. - No GU symptoms. - Followed by PCP.  LHC next week and echo scheduled for 10/18/20. Follow up with Dr. Bensimhon pending test results.  Signed, Jessica M Milford, FNP  10/08/2020 10:55 AM     [facility] [address], [place], [place]  [address] Phone: [phone]; Fax: [phone]   Patient seen and examined with the above-signed Advanced Practice Provider and/or Housestaff. I personally reviewed laboratory data, imaging studies and relevant notes. I independently examined the patient and formulated the important aspects of the plan. I have edited the note to reflect any of my changes or salient points. I have personally discussed the plan with the   patient and/or  family.  79 y/o with COPD, PAD (with ongoing tobacco use), chronic systolic HF with EF 25-30% presents for unscheduled visit due to worsening CP.   Says CP started 1-2 weeks ago. Now having daily episodes of left-sided chest pressure last 1-15 minutes. No clear exertional trigger. ECG ok.   Cath 12/19 LAD 40%   General:  Elderly No resp difficulty HEENT: normal Neck: supple. JVP 7Carotids 2+ bilat; no bruits. No lymphadenopathy or thryomegaly appreciated. Cor: PMI nondisplaced. Regular rate & rhythm. No rubs, gallops or murmurs. Lungs: clear with markedly decreased BS throughout Abdomen: soft, nontender, nondistended. No hepatosplenomegaly. No bruits or masses. Good bowel sounds. Extremities: no cyanosis, clubbing, rash, edema bluish toes on left  Neuro: alert & orientedx3, cranial nerves grossly intact. moves all 4 extremities w/o difficulty. Affect pleasant  CP concerning for ischemia CP. ECG ok. Had cath in 12/19 with LAD 40% but has been consistently smoking since.   Will plan R/L cath next week to evaluate for CAD and also assess filling pressures to make sure symptoms not due to worsening HF. Wil start Imdur. Knows to call 911 if CP rose over the weekend.   Daniel Bensimhon, MD  7:50 PM    

## 2020-10-08 NOTE — Telephone Encounter (Signed)
Pt left VM stating she received a VM that Dr.Bensimhon would see her this morning due to inclement weather on Monday. Per Adline Potter Dr.Bensimhon can see patient today. I called pt back to reschedule her. No answer/left VM for pt to return call to the scheduling line for appt today with Dr.Bensimhon

## 2020-10-09 ENCOUNTER — Other Ambulatory Visit: Payer: Medicare Other

## 2020-10-09 ENCOUNTER — Telehealth: Payer: Self-pay | Admitting: Physician Assistant

## 2020-10-09 ENCOUNTER — Encounter (HOSPITAL_COMMUNITY): Payer: Self-pay | Admitting: Emergency Medicine

## 2020-10-09 ENCOUNTER — Emergency Department (HOSPITAL_COMMUNITY): Payer: Medicare Other

## 2020-10-09 ENCOUNTER — Emergency Department (HOSPITAL_COMMUNITY)
Admission: EM | Admit: 2020-10-09 | Discharge: 2020-10-09 | Disposition: A | Discharge status: Home / Self Care | Payer: Medicare Other | Attending: Emergency Medicine | Admitting: Emergency Medicine

## 2020-10-09 ENCOUNTER — Other Ambulatory Visit: Payer: Self-pay

## 2020-10-09 DIAGNOSIS — Z7982 Long term (current) use of aspirin: Secondary | ICD-10-CM | POA: Insufficient documentation

## 2020-10-09 DIAGNOSIS — R6889 Other general symptoms and signs: Secondary | ICD-10-CM | POA: Diagnosis not present

## 2020-10-09 DIAGNOSIS — Z7984 Long term (current) use of oral hypoglycemic drugs: Secondary | ICD-10-CM | POA: Diagnosis not present

## 2020-10-09 DIAGNOSIS — J449 Chronic obstructive pulmonary disease, unspecified: Secondary | ICD-10-CM | POA: Diagnosis not present

## 2020-10-09 DIAGNOSIS — Z20822 Contact with and (suspected) exposure to covid-19: Secondary | ICD-10-CM

## 2020-10-09 DIAGNOSIS — I5042 Chronic combined systolic (congestive) and diastolic (congestive) heart failure: Secondary | ICD-10-CM | POA: Diagnosis not present

## 2020-10-09 DIAGNOSIS — F17210 Nicotine dependence, cigarettes, uncomplicated: Secondary | ICD-10-CM | POA: Diagnosis not present

## 2020-10-09 DIAGNOSIS — N182 Chronic kidney disease, stage 2 (mild): Secondary | ICD-10-CM | POA: Diagnosis not present

## 2020-10-09 DIAGNOSIS — Z794 Long term (current) use of insulin: Secondary | ICD-10-CM | POA: Diagnosis not present

## 2020-10-09 DIAGNOSIS — Z8551 Personal history of malignant neoplasm of bladder: Secondary | ICD-10-CM | POA: Insufficient documentation

## 2020-10-09 DIAGNOSIS — I130 Hypertensive heart and chronic kidney disease with heart failure and stage 1 through stage 4 chronic kidney disease, or unspecified chronic kidney disease: Secondary | ICD-10-CM | POA: Insufficient documentation

## 2020-10-09 DIAGNOSIS — Z743 Need for continuous supervision: Secondary | ICD-10-CM | POA: Diagnosis not present

## 2020-10-09 DIAGNOSIS — R42 Dizziness and giddiness: Secondary | ICD-10-CM | POA: Insufficient documentation

## 2020-10-09 DIAGNOSIS — E1165 Type 2 diabetes mellitus with hyperglycemia: Secondary | ICD-10-CM | POA: Diagnosis not present

## 2020-10-09 DIAGNOSIS — E1122 Type 2 diabetes mellitus with diabetic chronic kidney disease: Secondary | ICD-10-CM | POA: Insufficient documentation

## 2020-10-09 DIAGNOSIS — I2510 Atherosclerotic heart disease of native coronary artery without angina pectoris: Secondary | ICD-10-CM | POA: Diagnosis not present

## 2020-10-09 DIAGNOSIS — Z7951 Long term (current) use of inhaled steroids: Secondary | ICD-10-CM | POA: Diagnosis not present

## 2020-10-09 DIAGNOSIS — Z7901 Long term (current) use of anticoagulants: Secondary | ICD-10-CM | POA: Insufficient documentation

## 2020-10-09 DIAGNOSIS — R0689 Other abnormalities of breathing: Secondary | ICD-10-CM | POA: Diagnosis not present

## 2020-10-09 DIAGNOSIS — Z79899 Other long term (current) drug therapy: Secondary | ICD-10-CM | POA: Diagnosis not present

## 2020-10-09 DIAGNOSIS — R404 Transient alteration of awareness: Secondary | ICD-10-CM | POA: Diagnosis not present

## 2020-10-09 LAB — CBC
HCT: 34 % — ABNORMAL LOW (ref 36.0–46.0)
Hemoglobin: 10.8 g/dL — ABNORMAL LOW (ref 12.0–15.0)
MCH: 35.3 pg — ABNORMAL HIGH (ref 26.0–34.0)
MCHC: 31.8 g/dL (ref 30.0–36.0)
MCV: 111.1 fL — ABNORMAL HIGH (ref 80.0–100.0)
Platelets: 281 10*3/uL (ref 150–400)
RBC: 3.06 MIL/uL — ABNORMAL LOW (ref 3.87–5.11)
RDW: 18.7 % — ABNORMAL HIGH (ref 11.5–15.5)
WBC: 10 10*3/uL (ref 4.0–10.5)
nRBC: 0.5 % — ABNORMAL HIGH (ref 0.0–0.2)

## 2020-10-09 LAB — COMPREHENSIVE METABOLIC PANEL
ALT: 17 U/L (ref 0–44)
AST: 18 U/L (ref 15–41)
Albumin: 3.7 g/dL (ref 3.5–5.0)
Alkaline Phosphatase: 42 U/L (ref 38–126)
Anion gap: 10 (ref 5–15)
BUN: 29 mg/dL — ABNORMAL HIGH (ref 8–23)
CO2: 27 mmol/L (ref 22–32)
Calcium: 9.2 mg/dL (ref 8.9–10.3)
Chloride: 103 mmol/L (ref 98–111)
Creatinine, Ser: 1.26 mg/dL — ABNORMAL HIGH (ref 0.44–1.00)
GFR, Estimated: 43 mL/min — ABNORMAL LOW (ref >60)
Glucose, Bld: 238 mg/dL — ABNORMAL HIGH (ref 70–99)
Potassium: 4.3 mmol/L (ref 3.5–5.1)
Sodium: 140 mmol/L (ref 135–145)
Total Bilirubin: 0.7 mg/dL (ref 0.3–1.2)
Total Protein: 6.7 g/dL (ref 6.5–8.1)

## 2020-10-09 LAB — DIFFERENTIAL
Abs Immature Granulocytes: 0.09 10*3/uL — ABNORMAL HIGH (ref 0.00–0.07)
Basophils Absolute: 0.1 10*3/uL (ref 0.0–0.1)
Basophils Relative: 1 %
Eosinophils Absolute: 0.3 10*3/uL (ref 0.0–0.5)
Eosinophils Relative: 3 %
Immature Granulocytes: 1 %
Lymphocytes Relative: 17 %
Lymphs Abs: 1.7 10*3/uL (ref 0.7–4.0)
Monocytes Absolute: 0.6 10*3/uL (ref 0.1–1.0)
Monocytes Relative: 6 %
Neutro Abs: 7.3 10*3/uL (ref 1.7–7.7)
Neutrophils Relative %: 72 %

## 2020-10-09 LAB — URINALYSIS, ROUTINE W REFLEX MICROSCOPIC
Bilirubin Urine: NEGATIVE
Glucose, UA: >=500 mg/dL — AB
Hgb urine dipstick: NEGATIVE
Ketones, ur: NEGATIVE mg/dL
Leukocytes,Ua: NEGATIVE
Nitrite: NEGATIVE
Protein, ur: NEGATIVE mg/dL
Specific Gravity, Urine: 1.014 (ref 1.005–1.030)
pH: 5 (ref 5.0–8.0)

## 2020-10-09 LAB — PROTIME-INR
INR: 1.2 (ref 0.8–1.2)
Prothrombin Time: 14.5 seconds (ref 11.4–15.2)

## 2020-10-09 LAB — TROPONIN I (HIGH SENSITIVITY): Troponin I (High Sensitivity): 10 ng/L (ref <18)

## 2020-10-09 LAB — APTT: aPTT: 35 seconds (ref 24–36)

## 2020-10-09 MED ORDER — SODIUM CHLORIDE 0.9% FLUSH: 3.0000 mL | Freq: Once | INTRAVENOUS | Status: DC

## 2020-10-09 NOTE — Telephone Encounter (Signed)
80 y.o. female with non-obstructive coronary artery disease, heart failure with reduced ejection fraction, non-ischemic cardiomyopathy, peripheral arterial disease, COPD, tobacco abuse.  She was recently seen in the [facility] with symptoms of angina and is set up for cardiac catheterization on 10/13/20.  I received a page from her daughter that she had a spell today and that she was told to call if she is in the hospital.  Review of her chart indicates she is currently in the ED waiting room.  I tried to call her daughter but there was no answer and her voicemail is not set up.  If admitted, our service will need to see her to determine if cardiac catheterization can be done while she is here.  Richardson Dopp, PA-C    10/09/2020 2:43 PM

## 2020-10-09 NOTE — Telephone Encounter (Signed)
Received call from Morton Peters (daughter) earlier and then son Brittley Regner).  Both are on DPR. They wanted to make sure Dr. Haroldine Laws is aware that she came to the ED. I reviewed the ED physician's notes.  The pt had symptoms of dizziness after starting gabapentin.  Her workup was unremarkable.  Notes indicate she is not having chest pain. I called Dr. Haroldine Laws to make him aware of her visit to the ED. He recommended that the pt return on 1/19 for her cardiac catheterization as planned. Richardson Dopp, PA-C    10/09/2020 5:18 PM

## 2020-10-09 NOTE — ED Provider Notes (Addendum)
[facility] EMERGENCY DEPARTMENT Provider Note   CSN: [id number] Arrival date & time: 10/09/20  1410     History Chief Complaint  Patient presents with  . Dizziness    Hannah French is a 80 y.o. female.  Patient with history of CAD, CKD, heart failure presents to the ED after a brief episode of confusion, dizziness.  About 6 hours ago she felt slightly dizzy while eating.  Symptoms resolved quickly and she has been without any issues since.  May be slightly more lethargic than normal.  She is due for heart cath this coming week.  However she has not had any chest pain.  The history is provided by the patient.  Dizziness Quality:  Unable to specify Severity:  Unable to specify Onset quality:  Sudden Timing:  Unable to specify Progression:  Resolved Relieved by:  Nothing Worsened by:  Nothing Associated symptoms: no chest pain, no palpitations, no shortness of breath and no vomiting        Past Medical History:  Diagnosis Date  . Abdominal bruit   . Bladder cancer ([place])   . CAD (coronary artery disease)    a. nonobstructive by cath 08/2018.  . Carotid bruit   . Chronic combined systolic and diastolic CHF (congestive heart failure) ([place])   . CKD (chronic kidney disease), stage II   . Congestive heart failure (CHF) ([place])   . Diabetes mellitus    Type II  . Dyslipidemia   . Heart murmur   . Mild pulmonary hypertension ([place])   . Mitral valve prolapse    a. not seen on most recent echoes. Mild MR now.  Marland Kitchen NICM (nonischemic cardiomyopathy) ([place])   . Osteoporosis   . Pancreatitis, acute   . Pseudogout   . PVD (peripheral vascular disease) ([place])    a. 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  . Sinus tachycardia   . Tobacco abuse     Patient Active Problem List   Diagnosis Date Noted  . Skin abscess 01/15/2020  . Depression, recurrent ([place]) 08/08/2019  . Cigarette smoker 06/19/2019  . Vitamin  D deficiency 01/29/2019  . Dizziness 12/03/2018  . Chronic systolic CHF (congestive heart failure), NYHA class 2 ([place]) 12/03/2018  . CAD (coronary artery disease) 40-50% prox LAD 10/04/2018  . Pain in right hand 10/02/2018  . Myalgia due to statin 03/29/2018  . COPD GOLD II / active smoker 10/26/2017  . Tobacco abuse 03/07/2016  . Type 2 diabetes mellitus ([place]) 08/26/2015  . Osteoporosis 09/02/2014  . Carotid stenosis 07/22/2012  . PVD 07/02/2009  . BLADDER CANCER 06/05/2009  . Hyperlipidemia associated with type 2 diabetes mellitus ([place]) 06/05/2009  . PSEUDOGOUT 06/05/2009  . MITRAL VALVE PROLAPSE 06/05/2009    Past Surgical History:  Procedure Laterality Date  . ABDOMINAL AORTOGRAM W/LOWER EXTREMITY N/A 12/31/2019   Procedure: ABDOMINAL AORTOGRAM W/LOWER EXTREMITY;  Surgeon: Wellington Hampshire, MD;  Location: [facility];  Service: Cardiovascular;  Laterality: N/A;  . ABDOMINAL AORTOGRAM W/LOWER EXTREMITY N/A 09/01/2020   Procedure: ABDOMINAL AORTOGRAM W/LOWER EXTREMITY;  Surgeon: Wellington Hampshire, MD;  Location: [facility];  Service: Cardiovascular;  Laterality: N/A;  . APPENDECTOMY    . Hysterectomy-type unspecified    . LAPAROSCOPIC CHOLECYSTECTOMY  2009  . PERIPHERAL VASCULAR INTERVENTION  12/31/2019   Procedure: PERIPHERAL VASCULAR INTERVENTION;  Surgeon: Wellington Hampshire, MD;  Location: [facility];  Service: Cardiovascular;;  Bilateral [place]  . RIGHT/LEFT  HEART CATH AND CORONARY ANGIOGRAPHY N/A 09/16/2018   Procedure: RIGHT/LEFT HEART CATH AND CORONARY ANGIOGRAPHY;  Surgeon: Sherren Mocha, MD;  Location: [facility];  Service: Cardiovascular;  Laterality: N/A;     OB History   No obstetric history on file.     Family History  Problem Relation Age of Onset  . Heart failure Mother   . Hypertension Sister   . Heart attack Brother   . Heart attack Maternal Uncle   . Heart disease Sister   . COPD Brother   . Congestive Heart Failure  Brother   . Congestive Heart Failure Daughter   . Rheum arthritis Son   . Ovarian cancer Sister   . Coronary artery disease Neg Hx        Early    Social History   Tobacco Use  . Smoking status: Current Every Day Smoker    Packs/day: 1.00    Years: 50.00    Pack years: 50.00    Types: Cigarettes  . Smokeless tobacco: Never Used  Vaping Use  . Vaping Use: Never used  Substance Use Topics  . Alcohol use: No  . Drug use: No    Home Medications Prior to Admission medications   Medication Sig Start Date End Date Taking? Authorizing Provider  acetaminophen (TYLENOL) 500 MG tablet Take 500 mg by mouth every 6 (six) hours as needed for moderate pain.    [provider]  albuterol (VENTOLIN HFA) 108 (90 Base) MCG/ACT inhaler Inhale 2 puffs into the lungs every 6 (six) hours as needed for wheezing or shortness of breath. 06/24/20   Dettinger, Fransisca Kaufmann, MD  aspirin EC 81 MG tablet Take 1 tablet (81 mg total) by mouth daily. 02/03/20   Wellington Hampshire, MD  blood glucose meter kit and supplies Use up to four times daily as directed 04/16/19   Dettinger, Fransisca Kaufmann, MD  Cholecalciferol (VITAMIN D) 50 MCG (2000 UT) tablet Take 4,000 Units by mouth daily.     [provider]  cyclobenzaprine (FLEXERIL) 10 MG tablet TAKE 1 TABLET THREE TIMES DAILY FOR MUSCLE SPASM Patient taking differently: Take 10 mg by mouth 3 (three) times daily as needed for muscle spasms. 07/21/20   Dettinger, Fransisca Kaufmann, MD  digoxin (LANOXIN) 0.125 MG tablet TAKE (1/2) TABLET DAILY. Patient taking differently: Take 0.0625 mg by mouth daily. 09/20/20   Bensimhon, Shaune Pascal, MD  empagliflozin (JARDIANCE) 10 MG TABS tablet Take 1 tablet (10 mg total) by mouth daily before breakfast. 09/09/20   Dettinger, Fransisca Kaufmann, MD  Evolocumab (REPATHA) 140 MG/ML SOSY Inject 140 mg into the skin every 14 (fourteen) days. Patient not taking: No sig reported 09/09/20   Dettinger, Fransisca Kaufmann, MD  Fluticasone-Salmeterol (ADVAIR)  100-50 MCG/DOSE AEPB Inhale 1 puff into the lungs 2 (two) times daily. 09/09/20   Dettinger, Fransisca Kaufmann, MD  furosemide (LASIX) 20 MG tablet Take 2 tablets daily. You may take an extra dose for increase weight gain orswelling Patient taking differently: Take 40-60 mg by mouth See admin instructions. Take 40 mg daily. You may increase to 60 mg daily as needed for increased weight gain or swelling 09/09/20   Dettinger, Fransisca Kaufmann, MD  gabapentin (NEURONTIN) 300 MG capsule Take 1 capsule (300 mg total) by mouth 3 (three) times daily. 10/05/20   Wellington Hampshire, MD  glimepiride (AMARYL) 2 MG tablet Take 1-2 tablets (2-4 mg total) by mouth daily with breakfast. Patient taking differently: Take 4 mg by mouth daily  with breakfast. 09/09/20   Dettinger, Fransisca Kaufmann, MD  glucose blood (ONETOUCH VERIO) test strip TEst BS BID and as needed Dx E11.9 03/30/20   Dettinger, Fransisca Kaufmann, MD  insulin degludec (TRESIBA FLEXTOUCH) 100 UNIT/ML FlexTouch Pen Inject 10 Units into the skin daily. 09/09/20   Dettinger, Fransisca Kaufmann, MD  Insulin Pen Needle (B-D UF III MINI PEN NEEDLES) 31G X 5 MM MISC 1 each by Does not apply route 4 (four) times daily. 11/20/19   Dettinger, Fransisca Kaufmann, MD  Lancets (ONETOUCH DELICA PLUS [id number]) [place] UP TO 4 TIMES DAILY AS DIRECTED Dx E11.9 06/21/20   Dettinger, Fransisca Kaufmann, MD  metoprolol succinate (TOPROL-XL) 25 MG 24 hr tablet Take 1 tablet (25 mg total) by mouth daily. 03/15/20   Bensimhon, Shaune Pascal, MD  Naphazoline-Pheniramine (OPCON-A) 0.027-0.315 % SOLN Place 1 drop into both eyes daily as needed (Burning and itching eyes).    [provider]  nitroGLYCERIN (NITROSTAT) 0.3 MG SL tablet Place 1 tablet (0.3 mg total) under the tongue every 5 (five) minutes as needed for chest pain. 10/08/20 10/08/21  Rafael Bihari, FNP  predniSONE (DELTASONE) 50 MG tablet Take one tablet at 10:00pm on 10/12/2020, take one tablet at 4:00am on 10/13/2020, and take last tablet on the way to procedure with  50 mg of Benadryl (OTC) 10/08/20   [place], Maricela Bo, FNP  rivaroxaban (XARELTO) 2.5 MG TABS tablet Take 1 tablet (2.5 mg total) by mouth 2 (two) times daily. 09/08/20   Wellington Hampshire, MD    Allergies    Contrast media [iodinated diagnostic agents], Iohexol, Metformin, Statins, Bextra [valdecoxib], Cefdinir, Cozaar [losartan potassium], Nsaids, Zetia [ezetimibe], Rofecoxib, and Sulfa antibiotics  Review of Systems   Review of Systems  Constitutional: Negative for chills and fever.  HENT: Negative for ear pain and sore throat.   Eyes: Negative for pain and visual disturbance.  Respiratory: Negative for cough and shortness of breath.   Cardiovascular: Negative for chest pain and palpitations.  Gastrointestinal: Negative for abdominal pain and vomiting.  Genitourinary: Negative for dysuria and hematuria.  Musculoskeletal: Negative for arthralgias and back pain.  Skin: Negative for color change and rash.  Neurological: Positive for dizziness. Negative for seizures and syncope.  All other systems reviewed and are negative.   Physical Exam Updated Vital Signs BP 136/67 (BP Location: Right Arm)   Pulse 73   Temp 97.7 F (36.5 C)   Resp 17   SpO2 97%   Physical Exam Vitals and nursing note reviewed.  Constitutional:      General: She is not in acute distress.    Appearance: She is well-developed and well-nourished. She is not ill-appearing.  HENT:     Head: Normocephalic and atraumatic.     Mouth/Throat:     Mouth: Mucous membranes are moist.  Eyes:     Extraocular Movements: Extraocular movements intact.     Conjunctiva/sclera: Conjunctivae normal.     Pupils: Pupils are equal, round, and reactive to light.  Cardiovascular:     Rate and Rhythm: Normal rate and regular rhythm.     Pulses: Normal pulses.     Heart sounds: Normal heart sounds. No murmur heard.   Pulmonary:     Effort: Pulmonary effort is normal. No respiratory distress.     Breath sounds: Normal breath  sounds.  Abdominal:     Palpations: Abdomen is soft.     Tenderness: There is no abdominal tenderness.  Musculoskeletal:  General: No edema.     Cervical back: Normal range of motion and neck supple.  Skin:    General: Skin is warm and dry.  Neurological:     General: No focal deficit present.     Mental Status: She is alert and oriented to person, place, and time.     Cranial Nerves: No cranial nerve deficit.     Sensory: No sensory deficit.     Motor: No weakness.     Coordination: Coordination normal.     Gait: Gait normal.     Comments: 5+ out of 5 strength throughout, normal sensation, no drift, normal finger-nose-finger, normal speech  Psychiatric:        Mood and Affect: Mood and affect normal.     ED Results / Procedures / Treatments   Labs (all labs ordered are listed, but only abnormal results are displayed) Labs Reviewed  CBC - Abnormal; Notable for the following components:      Result Value   RBC 3.06 (*)    Hemoglobin 10.8 (*)    HCT 34.0 (*)    MCV 111.1 (*)    MCH 35.3 (*)    RDW 18.7 (*)    nRBC 0.5 (*)    All other components within normal limits  DIFFERENTIAL - Abnormal; Notable for the following components:   Abs Immature Granulocytes 0.09 (*)    All other components within normal limits  COMPREHENSIVE METABOLIC PANEL - Abnormal; Notable for the following components:   Glucose, Bld 238 (*)    BUN 29 (*)    Creatinine, Ser 1.26 (*)    GFR, Estimated 43 (*)    All other components within normal limits  PROTIME-INR  APTT    EKG None  Radiology CT HEAD WO CONTRAST  Result Date: 10/09/2020 CLINICAL DATA:  Dizziness and lightheadedness of sudden onset at 12:10 p.m. No reported injury. EXAM: CT HEAD WITHOUT CONTRAST TECHNIQUE: Contiguous axial images were obtained from the base of the skull through the vertex without intravenous contrast. COMPARISON:  10/19/2018 head CT. FINDINGS: Brain: Chronic small left cerebellar hemisphere focus of  encephalomalacia. No evidence of parenchymal hemorrhage or extra-axial fluid collection. No mass lesion, mass effect, or midline shift. No CT evidence of acute infarction. Nonspecific mild subcortical and periventricular white matter hypodensity, most in keeping with chronic small vessel ischemic change. Generalized mild cerebral volume loss. No ventriculomegaly. Vascular: No acute abnormality. Skull: No evidence of calvarial fracture. Sinuses/Orbits: The visualized paranasal sinuses are essentially clear. Other:  The mastoid air cells are unopacified. IMPRESSION: 1. No evidence of acute intracranial abnormality. 2. Chronic small left cerebellar hemisphere focus of encephalomalacia. 3. Generalized mild cerebral volume loss and mild chronic small vessel ischemic changes in the cerebral white matter. Electronically Signed   By: Ilona Sorrel M.D.   On: 10/09/2020 15:13    Procedures Procedures (including critical care time)  Medications Ordered in ED Medications  sodium chloride flush (NS) 0.9 % injection 3 mL (3 mLs Intravenous Not Given 10/09/20 1658)    ED Course  I have reviewed the triage vital signs and the nursing notes.  Pertinent labs & imaging results that were available during my care of the patient were reviewed by me and considered in my medical decision making (see chart for details).    MDM Rules/Calculators/A&P                          Hannah LITICIA GASIOR is  an 80 year old female with history of CAD, heart failure, CKD presents to the ED after episode of dizziness.  Normal vitals.  No fever.  Normal neurological exam.  She had a brief episode where she got dizzy and mildly confused several hours ago but quickly resolved.  Has maybe been a little bit lethargic since.  She is due for heart cath this week.  However has not had any chest pain today.  She will overall appears well.  She states that this occurred after taking a new medicine today called gabapentin.  Suspect maybe she has  had some mild sedation from gabapentin today but have a very low suspicion for stroke given normal exam.  Does not appear consistent with a TIA.  She is not having any chest pain.  Lab work already is unremarkable with no significant anemia, electrolyte abnormality, kidney injury.  EKG is unchanged from yesterday.  No new ischemic changes.  CT scan of the head was performed that showed no acute intracranial process.    Given recent cardiac issues will check troponin, urinalysis.  Will reevaluate afterwards.  Seems less likely that this was a cardiac event or an arrhythmic event.  Troponin unremarkable.  Urinalysis unremarkable.  Overall suspect may be some mild side effect from the gabapentin.  Discharged in good condition.  Understands follow-up.  This chart was dictated using voice recognition software.  Despite best efforts to proofread,  errors can occur which can change the documentation meaning.    Final Clinical Impression(s) / ED Diagnoses Final diagnoses:  Dizziness    Rx / DC Orders ED Discharge Orders    None       Lennice Sites, DO 10/09/20 [place], [place], DO 10/09/20 1925

## 2020-10-09 NOTE — ED Triage Notes (Signed)
Pt to triage via [facility] from home.  Sudden onset of dizziness/lightheaded at 12:10pm.  Started taking Gabapentin a few days ago.  Pt scheduled for cath on Wednesday.  CBG 323. Took insulin this morning.   No neuro deficits.

## 2020-10-11 ENCOUNTER — Ambulatory Visit (HOSPITAL_COMMUNITY): Payer: Medicare Other

## 2020-10-11 ENCOUNTER — Encounter (HOSPITAL_COMMUNITY): Payer: Medicare Other | Admitting: Internal Medicine

## 2020-10-12 ENCOUNTER — Telehealth (HOSPITAL_COMMUNITY): Payer: Self-pay | Admitting: Internal Medicine

## 2020-10-12 ENCOUNTER — Telehealth (HOSPITAL_COMMUNITY): Payer: Self-pay | Admitting: Cardiology

## 2020-10-12 LAB — NOVEL CORONAVIRUS, NAA: SARS-CoV-2, NAA: NOT DETECTED

## 2020-10-12 MED ORDER — DIGOXIN 125 MCG PO TABS: 0.0625 mg | ORAL_TABLET | Freq: Every day | ORAL | 3 refills | Status: DC

## 2020-10-12 NOTE — Telephone Encounter (Signed)
-----   Message from Rafael Bihari, [place] sent at 10/08/2020  3:35 PM EST ----- Please  have her cut her dig dose in half (0.0625 mg)

## 2020-10-12 NOTE — Telephone Encounter (Signed)
Pt aware and voiced understanding 

## 2020-10-12 NOTE — Telephone Encounter (Signed)
Encounter open in error 

## 2020-10-13 ENCOUNTER — Ambulatory Visit (HOSPITAL_COMMUNITY)
Admission: RE | Admit: 2020-10-13 | Discharge: 2020-10-13 | Disposition: A | Discharge status: Home / Self Care | Payer: Medicare Other | Attending: Internal Medicine | Admitting: Internal Medicine

## 2020-10-13 ENCOUNTER — Telehealth (HOSPITAL_COMMUNITY): Payer: Self-pay | Admitting: *Deleted

## 2020-10-13 ENCOUNTER — Encounter (HOSPITAL_COMMUNITY): Payer: Self-pay | Admitting: Internal Medicine

## 2020-10-13 ENCOUNTER — Other Ambulatory Visit: Payer: Self-pay

## 2020-10-13 ENCOUNTER — Ambulatory Visit (HOSPITAL_COMMUNITY)
Admission: RE | Disposition: A | Discharge status: Home / Self Care | Payer: Self-pay | Source: Home / Self Care | Attending: Internal Medicine

## 2020-10-13 DIAGNOSIS — Z888 Allergy status to other drugs, medicaments and biological substances status: Secondary | ICD-10-CM | POA: Insufficient documentation

## 2020-10-13 DIAGNOSIS — Z955 Presence of coronary angioplasty implant and graft: Secondary | ICD-10-CM | POA: Insufficient documentation

## 2020-10-13 DIAGNOSIS — I25110 Atherosclerotic heart disease of native coronary artery with unstable angina pectoris: Secondary | ICD-10-CM | POA: Diagnosis not present

## 2020-10-13 DIAGNOSIS — Z794 Long term (current) use of insulin: Secondary | ICD-10-CM | POA: Diagnosis not present

## 2020-10-13 DIAGNOSIS — Z882 Allergy status to sulfonamides status: Secondary | ICD-10-CM | POA: Insufficient documentation

## 2020-10-13 DIAGNOSIS — Z886 Allergy status to analgesic agent status: Secondary | ICD-10-CM | POA: Insufficient documentation

## 2020-10-13 DIAGNOSIS — I5042 Chronic combined systolic (congestive) and diastolic (congestive) heart failure: Secondary | ICD-10-CM | POA: Diagnosis not present

## 2020-10-13 DIAGNOSIS — I25118 Atherosclerotic heart disease of native coronary artery with other forms of angina pectoris: Secondary | ICD-10-CM

## 2020-10-13 DIAGNOSIS — E1122 Type 2 diabetes mellitus with diabetic chronic kidney disease: Secondary | ICD-10-CM | POA: Insufficient documentation

## 2020-10-13 DIAGNOSIS — N183 Chronic kidney disease, stage 3 unspecified: Secondary | ICD-10-CM | POA: Diagnosis not present

## 2020-10-13 DIAGNOSIS — J449 Chronic obstructive pulmonary disease, unspecified: Secondary | ICD-10-CM | POA: Diagnosis not present

## 2020-10-13 DIAGNOSIS — I5022 Chronic systolic (congestive) heart failure: Secondary | ICD-10-CM | POA: Insufficient documentation

## 2020-10-13 DIAGNOSIS — Z881 Allergy status to other antibiotic agents status: Secondary | ICD-10-CM | POA: Insufficient documentation

## 2020-10-13 DIAGNOSIS — E1151 Type 2 diabetes mellitus with diabetic peripheral angiopathy without gangrene: Secondary | ICD-10-CM | POA: Insufficient documentation

## 2020-10-13 DIAGNOSIS — Z7982 Long term (current) use of aspirin: Secondary | ICD-10-CM | POA: Diagnosis not present

## 2020-10-13 DIAGNOSIS — Z95820 Peripheral vascular angioplasty status with implants and grafts: Secondary | ICD-10-CM

## 2020-10-13 DIAGNOSIS — I25119 Atherosclerotic heart disease of native coronary artery with unspecified angina pectoris: Secondary | ICD-10-CM | POA: Diagnosis not present

## 2020-10-13 DIAGNOSIS — F17210 Nicotine dependence, cigarettes, uncomplicated: Secondary | ICD-10-CM | POA: Insufficient documentation

## 2020-10-13 DIAGNOSIS — E785 Hyperlipidemia, unspecified: Secondary | ICD-10-CM | POA: Diagnosis present

## 2020-10-13 DIAGNOSIS — Z79899 Other long term (current) drug therapy: Secondary | ICD-10-CM | POA: Insufficient documentation

## 2020-10-13 DIAGNOSIS — E119 Type 2 diabetes mellitus without complications: Secondary | ICD-10-CM

## 2020-10-13 DIAGNOSIS — E1169 Type 2 diabetes mellitus with other specified complication: Secondary | ICD-10-CM | POA: Diagnosis present

## 2020-10-13 HISTORY — PX: RIGHT/LEFT HEART CATH AND CORONARY ANGIOGRAPHY: CATH118266

## 2020-10-13 HISTORY — PX: CORONARY STENT INTERVENTION: CATH118234

## 2020-10-13 HISTORY — PX: CORONARY PRESSURE/FFR STUDY: CATH118243

## 2020-10-13 LAB — GLUCOSE, CAPILLARY
Glucose-Capillary: 329 mg/dL — ABNORMAL HIGH (ref 70–99)
Glucose-Capillary: 281 mg/dL — ABNORMAL HIGH (ref 70–99)

## 2020-10-13 LAB — POCT I-STAT EG7
Acid-Base Excess: 2 mmol/L (ref 0.0–2.0)
Acid-base deficit: 3 mmol/L — ABNORMAL HIGH (ref 0.0–2.0)
Bicarbonate: 24.3 mmol/L (ref 20.0–28.0)
Bicarbonate: 28.7 mmol/L — ABNORMAL HIGH (ref 20.0–28.0)
Calcium, Ion: 0.89 mmol/L — CL (ref 1.15–1.40)
Calcium, Ion: 1.19 mmol/L (ref 1.15–1.40)
HCT: 31 % — ABNORMAL LOW (ref 36.0–46.0)
HCT: 33 % — ABNORMAL LOW (ref 36.0–46.0)
Hemoglobin: 10.5 g/dL — ABNORMAL LOW (ref 12.0–15.0)
Hemoglobin: 11.2 g/dL — ABNORMAL LOW (ref 12.0–15.0)
O2 Saturation: 66 %
O2 Saturation: 67 %
Potassium: 3.3 mmol/L — ABNORMAL LOW (ref 3.5–5.1)
Potassium: 4.2 mmol/L (ref 3.5–5.1)
Sodium: 131 mmol/L — ABNORMAL LOW (ref 135–145)
Sodium: 137 mmol/L (ref 135–145)
TCO2: 26 mmol/L (ref 22–32)
TCO2: 30 mmol/L (ref 22–32)
pCO2, Ven: 52.1 mmHg (ref 44.0–60.0)
pCO2, Ven: 53.2 mmHg (ref 44.0–60.0)
pH, Ven: 7.277 (ref 7.250–7.430)
pH, Ven: 7.341 (ref 7.250–7.430)
pO2, Ven: 38 mmHg (ref 32.0–45.0)
pO2, Ven: 39 mmHg (ref 32.0–45.0)

## 2020-10-13 LAB — POCT I-STAT 7, (LYTES, BLD GAS, ICA,H+H)
Acid-Base Excess: 3 mmol/L — ABNORMAL HIGH (ref 0.0–2.0)
Acid-Base Excess: 4 mmol/L — ABNORMAL HIGH (ref 0.0–2.0)
Bicarbonate: 28.7 mmol/L — ABNORMAL HIGH (ref 20.0–28.0)
Bicarbonate: 29.3 mmol/L — ABNORMAL HIGH (ref 20.0–28.0)
Calcium, Ion: 1.11 mmol/L — ABNORMAL LOW (ref 1.15–1.40)
Calcium, Ion: 1.22 mmol/L (ref 1.15–1.40)
HCT: 31 % — ABNORMAL LOW (ref 36.0–46.0)
HCT: 31 % — ABNORMAL LOW (ref 36.0–46.0)
Hemoglobin: 10.5 g/dL — ABNORMAL LOW (ref 12.0–15.0)
Hemoglobin: 10.5 g/dL — ABNORMAL LOW (ref 12.0–15.0)
O2 Saturation: 98 %
O2 Saturation: 99 %
Potassium: 4.4 mmol/L (ref 3.5–5.1)
Potassium: 4.4 mmol/L (ref 3.5–5.1)
Sodium: 139 mmol/L (ref 135–145)
Sodium: 142 mmol/L (ref 135–145)
TCO2: 30 mmol/L (ref 22–32)
TCO2: 31 mmol/L (ref 22–32)
pCO2 arterial: 45.5 mmHg (ref 32.0–48.0)
pCO2 arterial: 55.5 mmHg — ABNORMAL HIGH (ref 32.0–48.0)
pH, Arterial: 7.33 — ABNORMAL LOW (ref 7.350–7.450)
pH, Arterial: 7.408 (ref 7.350–7.450)
pO2, Arterial: 116 mmHg — ABNORMAL HIGH (ref 83.0–108.0)
pO2, Arterial: 132 mmHg — ABNORMAL HIGH (ref 83.0–108.0)

## 2020-10-13 LAB — POCT ACTIVATED CLOTTING TIME: Activated Clotting Time: 255 seconds

## 2020-10-13 SURGERY — RIGHT/LEFT HEART CATH AND CORONARY ANGIOGRAPHY
Anesthesia: LOCAL

## 2020-10-13 MED ORDER — SODIUM CHLORIDE 0.9 % IV SOLN: 250.0000 mL | INTRAVENOUS | Status: DC | PRN

## 2020-10-13 MED ORDER — ASPIRIN 81 MG PO CHEW
CHEWABLE_TABLET | ORAL | Status: AC
  Administered 2020-10-13: 81 mg via ORAL
  Filled 2020-10-13: qty 1

## 2020-10-13 MED ORDER — VERAPAMIL HCL 2.5 MG/ML IV SOLN
INTRAVENOUS | Status: AC
  Filled 2020-10-13: qty 2

## 2020-10-13 MED ORDER — VITAMIN D 50 MCG (2000 UT) PO TABS: 4000.0000 [IU] | ORAL_TABLET | Freq: Every day | ORAL | Status: DC

## 2020-10-13 MED ORDER — EMPAGLIFLOZIN 10 MG PO TABS: 10.0000 mg | ORAL_TABLET | Freq: Every day | ORAL | Status: DC

## 2020-10-13 MED ORDER — CLOPIDOGREL BISULFATE 75 MG PO TABS
ORAL_TABLET | ORAL | Status: AC
  Filled 2020-10-13: qty 1

## 2020-10-13 MED ORDER — MIDAZOLAM HCL 2 MG/2ML IJ SOLN
INTRAMUSCULAR | Status: AC
  Filled 2020-10-13: qty 2

## 2020-10-13 MED ORDER — CLOPIDOGREL BISULFATE 300 MG PO TABS
ORAL_TABLET | ORAL | Status: AC
  Filled 2020-10-13: qty 1

## 2020-10-13 MED ORDER — GABAPENTIN 300 MG PO CAPS: 300.0000 mg | ORAL_CAPSULE | Freq: Three times a day (TID) | ORAL | Status: DC

## 2020-10-13 MED ORDER — SODIUM CHLORIDE 0.9 % IV SOLN
250.0000 mL | INTRAVENOUS | Status: DC | PRN
Start: 2020-10-13 — End: 2020-10-13

## 2020-10-13 MED ORDER — VERAPAMIL HCL 2.5 MG/ML IV SOLN
INTRAVENOUS | Status: DC | PRN
  Administered 2020-10-13: 10 mL via INTRA_ARTERIAL

## 2020-10-13 MED ORDER — MOMETASONE FURO-FORMOTEROL FUM 100-5 MCG/ACT IN AERO: 2.0000 | INHALATION_SPRAY | Freq: Two times a day (BID) | RESPIRATORY_TRACT | Status: DC

## 2020-10-13 MED ORDER — HEPARIN (PORCINE) IN NACL 1000-0.9 UT/500ML-% IV SOLN
INTRAVENOUS | Status: DC | PRN
  Administered 2020-10-13 (×2): 500 mL

## 2020-10-13 MED ORDER — HYDRALAZINE HCL 20 MG/ML IJ SOLN: 10.0000 mg | INTRAMUSCULAR | Status: DC | PRN

## 2020-10-13 MED ORDER — FENTANYL CITRATE (PF) 100 MCG/2ML IJ SOLN
INTRAMUSCULAR | Status: AC
  Filled 2020-10-13: qty 2

## 2020-10-13 MED ORDER — DIGOXIN 0.0625 MG HALF TABLET: 0.0625 mg | ORAL_TABLET | Freq: Every day | ORAL | Status: DC

## 2020-10-13 MED ORDER — RIVAROXABAN 2.5 MG PO TABS: 2.5000 mg | ORAL_TABLET | Freq: Two times a day (BID) | ORAL | Status: DC

## 2020-10-13 MED ORDER — SODIUM CHLORIDE 0.9% FLUSH: 3.0000 mL | INTRAVENOUS | Status: DC | PRN

## 2020-10-13 MED ORDER — MORPHINE SULFATE (PF) 2 MG/ML IV SOLN: 1.0000 mg | INTRAVENOUS | Status: DC | PRN

## 2020-10-13 MED ORDER — ANGIOPLASTY BOOK
Status: AC
  Filled 2020-10-13: qty 1

## 2020-10-13 MED ORDER — LIDOCAINE HCL (PF) 1 % IJ SOLN
INTRAMUSCULAR | Status: AC
  Filled 2020-10-13: qty 30

## 2020-10-13 MED ORDER — HEPARIN SODIUM (PORCINE) 1000 UNIT/ML IJ SOLN
INTRAMUSCULAR | Status: DC | PRN
  Administered 2020-10-13: 2000 [IU] via INTRAVENOUS
  Administered 2020-10-13: 3000 [IU] via INTRAVENOUS

## 2020-10-13 MED ORDER — CLOPIDOGREL BISULFATE 300 MG PO TABS
ORAL_TABLET | ORAL | Status: DC | PRN
  Administered 2020-10-13: 300 mg via ORAL

## 2020-10-13 MED ORDER — MIDAZOLAM HCL 2 MG/2ML IJ SOLN
INTRAMUSCULAR | Status: DC | PRN
  Administered 2020-10-13: 1 mg via INTRAVENOUS

## 2020-10-13 MED ORDER — ACETAMINOPHEN 325 MG PO TABS: 650.0000 mg | ORAL_TABLET | ORAL | Status: DC | PRN

## 2020-10-13 MED ORDER — CLOPIDOGREL BISULFATE 75 MG PO TABS: 75.0000 mg | ORAL_TABLET | Freq: Every day | ORAL | 0 refills | Status: DC

## 2020-10-13 MED ORDER — HEPARIN SODIUM (PORCINE) 1000 UNIT/ML IJ SOLN
INTRAMUSCULAR | Status: AC
  Filled 2020-10-13: qty 1

## 2020-10-13 MED ORDER — SODIUM CHLORIDE 0.9 % IV SOLN: INTRAVENOUS | Status: DC

## 2020-10-13 MED ORDER — CLOPIDOGREL BISULFATE 75 MG PO TABS: 75.0000 mg | ORAL_TABLET | Freq: Every day | ORAL | Status: DC

## 2020-10-13 MED ORDER — NAPHAZOLINE-PHENIRAMINE 0.027-0.315 % OP SOLN: 1.0000 [drp] | Freq: Every day | OPHTHALMIC | Status: DC | PRN

## 2020-10-13 MED ORDER — ASPIRIN 81 MG PO CHEW: 81.0000 mg | CHEWABLE_TABLET | ORAL | Status: AC

## 2020-10-13 MED ORDER — ASPIRIN EC 81 MG PO TBEC: 81.0000 mg | DELAYED_RELEASE_TABLET | Freq: Every day | ORAL | 0 refills | Status: DC

## 2020-10-13 MED ORDER — NITROGLYCERIN 0.3 MG SL SUBL: 0.3000 mg | SUBLINGUAL_TABLET | SUBLINGUAL | Status: DC | PRN

## 2020-10-13 MED ORDER — ASPIRIN EC 81 MG PO TBEC: 81.0000 mg | DELAYED_RELEASE_TABLET | Freq: Every day | ORAL | Status: DC

## 2020-10-13 MED ORDER — LABETALOL HCL 5 MG/ML IV SOLN: 10.0000 mg | INTRAVENOUS | Status: DC | PRN

## 2020-10-13 MED ORDER — ONDANSETRON HCL 4 MG/2ML IJ SOLN: 4.0000 mg | Freq: Four times a day (QID) | INTRAMUSCULAR | Status: DC | PRN

## 2020-10-13 MED ORDER — METOPROLOL SUCCINATE ER 25 MG PO TB24: 25.0000 mg | ORAL_TABLET | Freq: Every day | ORAL | Status: DC

## 2020-10-13 MED ORDER — SODIUM CHLORIDE 0.9% FLUSH: 3.0000 mL | Freq: Two times a day (BID) | INTRAVENOUS | Status: DC

## 2020-10-13 MED ORDER — CLOPIDOGREL BISULFATE 75 MG PO TABS: 75.0000 mg | ORAL_TABLET | Freq: Every day | ORAL | 2 refills | Status: AC

## 2020-10-13 MED ORDER — HEPARIN SODIUM (PORCINE) 1000 UNIT/ML IJ SOLN
INTRAMUSCULAR | Status: DC | PRN
  Administered 2020-10-13: 2500 [IU] via INTRAVENOUS

## 2020-10-13 MED ORDER — INSULIN PEN NEEDLE 31G X 5 MM MISC: 1.0000 | Freq: Four times a day (QID) | Status: DC

## 2020-10-13 MED ORDER — IOHEXOL 350 MG/ML SOLN
INTRAVENOUS | Status: DC | PRN
  Administered 2020-10-13: 70 mL
  Administered 2020-10-13: 40 mL

## 2020-10-13 MED ORDER — LIDOCAINE HCL (PF) 1 % IJ SOLN
INTRAMUSCULAR | Status: DC | PRN
  Administered 2020-10-13 (×2): 2 mL via INTRADERMAL

## 2020-10-13 MED ORDER — PREDNISONE 50 MG PO TABS: 50.0000 mg | ORAL_TABLET | Freq: Once | ORAL | Status: DC

## 2020-10-13 MED ORDER — INSULIN DEGLUDEC 100 UNIT/ML ~~LOC~~ SOPN: 10.0000 [IU] | PEN_INJECTOR | Freq: Every day | SUBCUTANEOUS | Status: DC

## 2020-10-13 MED ORDER — ACETAMINOPHEN 500 MG PO TABS: 500.0000 mg | ORAL_TABLET | Freq: Four times a day (QID) | ORAL | Status: DC | PRN

## 2020-10-13 MED ORDER — FENTANYL CITRATE (PF) 100 MCG/2ML IJ SOLN
INTRAMUSCULAR | Status: DC | PRN
  Administered 2020-10-13 (×2): 25 ug via INTRAVENOUS

## 2020-10-13 MED ORDER — HEPARIN (PORCINE) IN NACL 1000-0.9 UT/500ML-% IV SOLN
INTRAVENOUS | Status: AC
  Filled 2020-10-13: qty 1000

## 2020-10-13 MED ORDER — ALBUTEROL SULFATE HFA 108 (90 BASE) MCG/ACT IN AERS: 2.0000 | INHALATION_SPRAY | Freq: Four times a day (QID) | RESPIRATORY_TRACT | Status: DC | PRN

## 2020-10-13 MED FILL — CLOPIDOGREL 75 MG TABLET: 75 | 90 days supply | Qty: 90 | Fill #0 | Status: TO

## 2020-10-13 SURGICAL SUPPLY — 19 items
BALLN EMERGE MR 2.25X12 (BALLOONS) ×2
BALLOON EMERGE MR 2.25X12 (BALLOONS) ×1 IMPLANT
CATH BALLN WEDGE 5F 110CM (CATHETERS) ×2 IMPLANT
CATH INFINITI 5 FR JL3.5 (CATHETERS) ×2 IMPLANT
CATH INFINITI MULTIPACK ANG 4F (CATHETERS) ×2 IMPLANT
CATH LAUNCHER 5F EBU3.5 (CATHETERS) ×2 IMPLANT
DEVICE RAD COMP TR BAND LRG (VASCULAR PRODUCTS) ×2 IMPLANT
GUIDEWIRE .025 260CM (WIRE) ×2 IMPLANT
GUIDEWIRE INQWIRE 1.5J.035X260 (WIRE) ×1 IMPLANT
GUIDEWIRE PRESSURE COMET II (WIRE) ×2 IMPLANT
INQWIRE 1.5J .035X260CM (WIRE) ×2
KIT ENCORE 26 ADVANTAGE (KITS) ×2 IMPLANT
KIT ESSENTIALS PG (KITS) ×2 IMPLANT
PACK CARDIAC CATHETERIZATION (CUSTOM PROCEDURE TRAY) ×2 IMPLANT
SHEATH GLIDE SLENDER 4/5FR (SHEATH) ×4 IMPLANT
SHEATH PROBE COVER 6X72 (BAG) ×2 IMPLANT
STENT RESOLUTE ONYX 2.5X15 (Permanent Stent) ×2 IMPLANT
TRANSDUCER W/STOPCOCK (MISCELLANEOUS) ×2 IMPLANT
WIRE HI TORQ VERSACORE-J 145CM (WIRE) ×2 IMPLANT

## 2020-10-13 NOTE — Progress Notes (Signed)
Ria Comment PA in earlier and notified of cbg and no new orders

## 2020-10-13 NOTE — Discharge Instructions (Addendum)
Radial Site Care  This sheet gives you information about how to care for yourself after your procedure. Your health care provider may also give you more specific instructions. If you have problems or questions, contact your health care provider. What can I expect after the procedure? After the procedure, it is common to have:  Bruising and tenderness at the catheter insertion area. Follow these instructions at home: Medicines  Take over-the-counter and prescription medicines only as told by your health care provider. Insertion site care  Follow instructions from your health care provider about how to take care of your insertion site. Make sure you: ? Wash your hands with soap and water before you change your bandage (dressing). If soap and water are not available, use hand sanitizer. ? Change your dressing as told by your health care provider. ? Leave stitches (sutures), skin glue, or adhesive strips in place. These skin closures may need to stay in place for 2 weeks or longer. If adhesive strip edges start to loosen and curl up, you may trim the loose edges. Do not remove adhesive strips completely unless your health care provider tells you to do that.  Check your insertion site every day for signs of infection. Check for: ? Redness, swelling, or pain. ? Fluid or blood. ? Pus or a bad smell. ? Warmth.  Do not take baths, swim, or use a hot tub until your health care provider approves.  You may shower 24-48 hours after the procedure, or as directed by your health care provider. ? Remove the dressing and gently wash the site with plain soap and water. ? Pat the area dry with a clean towel. ? Do not rub the site. That could cause bleeding.  Do not apply powder or lotion to the site. Activity  For 24 hours after the procedure, or as directed by your health care provider: ? Do not flex or bend the affected arm. ? Do not push or pull heavy objects with the affected arm. ? Do not drive  yourself home from the hospital or clinic. You may drive 24 hours after the procedure unless your health care provider tells you not to. ? Do not operate machinery or power tools.  Do not lift anything that is heavier than 10 lb (4.5 kg), or the limit that you are told, until your health care provider says that it is safe.  Ask your health care provider when it is okay to: ? Return to work or school. ? Resume usual physical activities or sports. ? Resume sexual activity.   General instructions  If the catheter site starts to bleed, raise your arm and put firm pressure on the site. If the bleeding does not stop, get help right away. This is a medical emergency.  If you went home on the same day as your procedure, a responsible adult should be with you for the first 24 hours after you arrive home.  Keep all follow-up visits as told by your health care provider. This is important. Contact a health care provider if:  You have a fever.  You have redness, swelling, or yellow drainage around your insertion site. Get help right away if:  You have unusual pain at the radial site.  The catheter insertion area swells very fast.  The insertion area is bleeding, and the bleeding does not stop when you hold steady pressure on the area.  Your arm or hand becomes pale, cool, tingly, or numb. These symptoms may represent a serious   problem that is an emergency. Do not wait to see if the symptoms will go away. Get medical help right away. Call your local emergency services (911 in the [place] Do not drive yourself to the hospital. Summary  After the procedure, it is common to have bruising and tenderness at the site.  Follow instructions from your health care provider about how to take care of your radial site wound. Check the wound every day for signs of infection.  Do not lift anything that is heavier than 10 lb (4.5 kg), or the limit that you are told, until your health care provider says that it  is safe. This information is not intended to replace advice given to you by your health care provider. Make sure you discuss any questions you have with your health care provider. Document Revised: 10/17/2017 Document Reviewed: 10/17/2017 Elsevier Patient Education  2021 [employer]    Coronary Angiogram With Stent, Care After This sheet gives you information about how to care for yourself after your procedure. Your health care provider may also give you more specific instructions. If you have problems or questions, contact your health care provider. What can I expect after the procedure? After the procedure, it is common to have:  Bruising and tenderness at the insertion site. This usually fades within 1-2 weeks.  A collection of blood under the skin (hematoma). This usually decreases within 1-2 weeks. Follow these instructions at home: Medicines  Take over-the-counter and prescription medicines only as told by your health care provider.  If you were prescribed an antibiotic medicine, take it as told by your health care provider. Do not stop using the antibiotic even if you start to feel better.  If you take medicines for diabetes, your health care provider may need to change how much you take. Ask your health care provider for specific directions about taking your diabetes medicines.  If you are taking blood thinners: ? Talk with your health care provider before you take any medicines that contain aspirin or NSAIDs, such as ibuprofen. These medicines increase your risk for dangerous bleeding. ? Take your medicine exactly as told, at the same time every day. ? Avoid activities that could cause injury or bruising, and follow instructions about how to prevent falls. ? Wear a medical alert bracelet or carry a card that lists what medicines you take. Eating and drinking  Follow instructions from your health care provider about eating or drinking restrictions.  Eat a heart-healthy  diet that includes plenty of fresh fruits and vegetables.  Avoid foods that are high in salt, sugar, or saturated fat. Avoid fried foods or canned or highly processed food.  Drink enough fluid to keep your urine pale yellow.   Alcohol use  Do not drink alcohol if: ? Your health care provider tells you not to. ? You are pregnant, may be pregnant, or plan to become pregnant.  If you drink alcohol: ? Limit how much you use to:  0-1 drink a day for women.  0-2 drinks a day for men. ? Be aware of how much alcohol is in your drink. In the [place] one drink equals one 12 oz bottle of beer (355 mL), one 5 oz glass of wine (148 mL), or one 1 oz glass of hard liquor (44 mL). Bathing  Do not take baths, swim, or use a hot tub until your health care provider approves. Ask your health care provider if you may take showers. You may only be allowed to   take sponge baths.  Gently wash the insertion site with plain soap and water.  Pat the area dry with a clean towel. Do not rub. This may cause bleeding. Incision care  Follow instructions from your health care provider about how to take care of your insertion area. Make sure you: ? Wash your hands with soap and water before and after you change your bandage (dressing). If soap and water are not available, use hand sanitizer. ? Change your dressing as told by your health care provider. ? Leave stitches (sutures) or adhesive strips in place. These skin closures may need to stay in place for 2 weeks or longer. If adhesive strip edges start to loosen and curl up, you may trim the loose edges. Do not remove adhesive strips completely unless your health care provider tells you to do that.  Do not apply powder or lotion on the insertion area.  Check your insertion area every day for signs of infection. Check for: ? Redness, swelling, or pain. ? Fluid or blood. ? Warmth. ? Pus or a bad smell. Activity  Do not drive for 24 hours if you were given a  sedative during your procedure.  Rest as told by your health care provider. ? Avoid sitting for a long time without moving. Get up to take short walks every 1-2 hours. This is important to improve blood flow and breathing. Ask for help if you feel weak or unsteady.  Do not lift anything that is heavier than 10 lb (4.5 kg), or the limit that you are told, until your health care provider says that it is safe.  Return to your normal activities as told by your health care provider. Ask your health care provider what activities are safe for you. Lifestyle  Do not use any products that contain nicotine or tobacco, such as cigarettes, e-cigarettes, and chewing tobacco. If you need help quitting, ask your health care provider.  If needed, work with your health care provider to treat other problems, such as being overweight, or having high blood pressure or diabetes.  Get regular exercise. Do exercises as told by your health care provider.   General instructions  Tell all your health care providers that you have a stent. This is especially important if you are going to get imaging studies, such as MRI.  Wear compression stockings as told by your health care provider. These stockings help to prevent blood clots and reduce swelling in your legs.  Do not strain during a bowel movement if the procedure was done through your leg. Straining may cause bleeding from the insertion site.  Keep all follow-up visits as directed by your health care provider. This is important. Contact a health care provider if you:  Have a fever.  Have chills.  Have redness, swelling, or pain around your insertion area.  Have fluid or blood (other than a little blood on the dressing) coming from your insertion area.  Notice that your insertion area feels warm to the touch.  Have pus or a bad smell coming from your insertion area.  Have more bleeding from the insertion area. Hold pressure on the area. Get help right  away if:  You develop chest pain or shortness of breath.  You feel like fainting or you faint.  Your leg or arm becomes cool, numb, or tingly.  You have unusual pain.  Your insertion area is bleeding, and bleeding continues after 30 minutes of steadily held pressure.  You develop bleeding anywhere else,   including from your rectum. There may be bright red blood in your urine or stool, or you may have black, tarry stool. These symptoms may represent a serious problem that is an emergency. Do not wait to see if the symptoms will go away. Get medical help right away. Call your local emergency services (911 in the [place] Do not drive yourself to the hospital. Summary  After this procedure, it is common to have bruising and tenderness around the catheter insertion site. This will go away in a few weeks.  Follow your health care provider's instructions about caring for your insertion site. Change dressing and clean the area as instructed.  Eat a heart-healthy diet. Limit alcohol use. Do not use tobacco or nicotine.  Contact a health care provider if you have fever or chills, or if you have pus or a bad smell coming from the site.  Get help right away if you develop chest pain, you faint, or have bleeding at the insertion site. This information is not intended to replace advice given to you by your health care provider. Make sure you discuss any questions you have with your health care provider. Document Revised: 04/02/2019 Document Reviewed: 04/02/2019 Elsevier Patient Education  [place] about your medication: Plavix (anti-platelet agent)  Generic Name (Brand): clopidogrel (Plavix), once daily medication  PURPOSE: You are taking this medication along with aspirin to lower your chance of having a heart attack, stroke, or blood clots in your heart stent. These can be fatal. Plavix and aspirin help prevent platelets from sticking together and forming a clot that  can block an artery or your stent.   Common SIDE EFFECTS you may experience include: bruising or bleeding more easily, shortness of breath  Do not stop taking PLAVIX without talking to the doctor who prescribes it for you. People who are treated with a stent and stop taking Plavix too soon, have a higher risk of getting a blood clot in the stent, having a heart attack, or dying. If you stop Plavix because of bleeding, or for other reasons, your risk of a heart attack or stroke may increase.   Avoid taking NSAID agents or anti-inflammatory medications such as ibuprofen, naproxen given increased bleed risk with plavix - can use acetaminophen (Tylenol) if needed for pain.  Avoid taking over the counter stomach medications omeprazole (Prilosec) or esomeprazole (Nexium) since these do interact and make plavix less effective - ask your pharmacist or doctor for alterative agents if needed for heartburn or GERD.   Tell all of your doctors and dentists that you are taking Plavix. They should talk to the doctor who prescribed Plavix for you before you have any surgery or invasive procedure.   Contact your health care provider if you experience: severe or uncontrollable bleeding, pink/red/brown urine, vomiting blood or vomit that looks like "coffee grounds", red or black stools (looks like tar), coughing up blood or blood clots ----------------------------------------------------------------------------------------------------------------------   De-escalation of Triple Therapy Post-PCI  Underwent cardiac catheterization with placement of drug-eluting stent on 10/13/2020. Plan for triple therapy with aspirin 81 mg and clopidogrel (Plavix) in addition to oral anticoagulation using rivaroxaban (Xarelto). Plan to discontinue aspirin on 11/12/2020.

## 2020-10-13 NOTE — Telephone Encounter (Signed)
Pt's R/L HC, cpt 93460, approved 10/12/20-11/26/20, approval # [id number]

## 2020-10-13 NOTE — Research (Signed)
[place] Informed Consent   Subject Name: Hannah French  Subject met inclusion and exclusion criteria.  The informed consent form, study requirements and expectations were reviewed with the subject and questions and concerns were addressed prior to the signing of the consent form.  The subject verbalized understanding of the trial requirements.  The subject agreed to participate in the [facility] trial and signed the informed consent at 1040 on 10/13/2020.  The informed consent was obtained prior to performance of any protocol-specific procedures for the subject.  A copy of the signed informed consent was given to the subject and a copy was placed in the subject's medical record.   [name]

## 2020-10-13 NOTE — Progress Notes (Signed)
[id number] Discussed with pt the importance of plavix with stent. Reviewed NTG use. Gave pt smoking cessation handout and discussed reasons to quit. Pt stated she doubt she will quit. Stated has quit several times but always restarts. Gave diabetic and heart healthy diets. Encouraged walking as tolerated. Discussed CRP 2 and referred to [facility] program. Pt does not think she will attend as she does not drive and family would have to take her. Stated hospital is far and gas would be a factor also.  Voiced understanding of ed. Graylon Good RN BSN 10/13/2020 2:57 PM

## 2020-10-13 NOTE — Discharge Summary (Signed)
Discharge Summary for Same Day PCI   Patient ID: Hannah French MRN: [id number]; DOB: [birth year]  Admit date: 10/13/2020 Discharge date: 10/13/2020  Primary Care Provider: Dettinger, Fransisca Kaufmann, MD  Primary Cardiologist: Glori Bickers, MD Primary Electrophysiologist:  None   Discharge Diagnoses    Principal Problem:   Coronary artery disease involving native coronary artery of native heart with angina pectoris [facility]) Active Problems:   Hyperlipidemia associated with type 2 diabetes mellitus ([place])   Type 2 diabetes mellitus ([place])   Chronic systolic CHF (congestive heart failure), NYHA class 2 ([place])  Diagnostic Studies/Procedures    Cardiac Catheterization 10/13/2020: (Dr. Haroldine Laws)    Prox LAD lesion is 40% stenosed.  Prox LAD to Mid LAD lesion is 65% stenosed.  Ost LM to Mid LM lesion is 20% stenosed.   Findings:  Ao = 154/53 (98) LV = 155/17 RA = 1 RV = 44/6 PA = 40/17 (30) PCW = 18 (v = 26) Fick cardiac output/index = 4.1/2.8 PVR = 2.9 WU FA sat = 99% PA sat =66%, 67%  Assessment: 1. Diffuse mild CAD with borderline lesion (60-70%) in mid LAD 2. Normal LFEV 60% 3. Mild PAH   Plan/Discussion:  LAD lesion looks borderline. Given chest pain symptoms will proceed with DFR LAD lesion to assess for hemodynamic significance.   Glori Bickers, MD  12:30 PM  Cath: 10/13/20 (PCI done by Dr. Ellyn Hack)   Prox LAD-1 lesion is 40% stenosed. DFR negative at 0.93  Prox LAD-2 lesion is 75% stenosed. DFR +0.83  A drug-eluting stent was successfully placed covering the lesion #2, using a STENT RESOLUTE ONYX 2.5X15. Postdilated to 2.75 mm  Post intervention, there is a 0% residual stenosis.   Successful DFR guided DES PCI of mid LAD -> using Resolute Onyx DES 2.5 mm x 15 mm postdilated to 2.75 mm  Recommendations  Will attempt same-day discharge  Recommend to resume Rivaroxaban, at currently prescribed dose and frequency on 10/13/2020. Recommend  concurrent antiplatelet therapy of Aspirin 81 mg for 1 month and Clopidogrel 73m daily for 12 months . If there are significant bleeding complications, could make clopidogrel interrupted after 6 months.  DGlenetta Hew MD  Diagnostic Dominance: Right    Intervention      _____________   History of Present Illness     Hannah JSINIYA LICHTYis a 80y.o. 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   She was seen in the office on 08/26/2018 by Dr. CBurt Knackand was noted to have shortness of breath felt to be related to COPD and URI. She did have a progression on her EKG from previous IVCD to left bundle branch block. An echo was done which showed a decline in LV function with EF 30-35% with diffuse hypokinesis (prior was normal). With new LBBB and significant change in LV function she was sent for right and left heart cath which was done on 09/16/2018 which showed nonobstructive CAD with diffuse luminal irregularities involving the left main, left circumflex and RCA. There was 40-50% calcific stenosis of the proximal LAD. There was mild pulmonary hypertension and mild elevation in LVEDP with preserved cardiac output. Medical therapy was recommended.. She had not been able to take statin drugs  or aspirin because of intolerance to both medicines.  Her medical therapy has been limited by low BP and dizziness (beta blocker reduced, losartan stopped). Corlanor was started for elevated HR and soft BP but not yet approved, F/u echo 04/28/19 showed EF 25-30%, diffuse HK, Grade II DD,  mildly dilated LA, mild MR.   Seen for the first time in the HF Clinic in 8/20. Was doing  fairly well. Felt SOB mostly due to COPD. Hall walk with sats 93-96%. Unable to titrate HF meds due to low BP and dizziness  Underwentstenting of bilateral common iliac artery stent placement extending into the distal aorta on 12/31/19  Returned for f/u 8/21. Over past few weeks has been having pain and swelling in the right groin. No CP. Chronically SOB and exhausted. No claudication. No LE wounds. LE U/S ordered and normal.  Returned to the AHF clinic on 10/08/20 complaining of intermittent left upper chest tightness for past 2 weeks, 4-5 x/day lasting few minutes. Tightness did not radiate. No recent arm/shoulder trauma. Had not taken any Nitro. Dr. Fletcher Anon recently started her on low-dose Xarelto for worsening pain in toe. Overall felt about the same, able to do housework. SOB with minimal activity. Smoking 1 ppd. Denies increasing SOB, CP, dizziness, edema, or PND/Orthopnea. Appetite ok. No fever or chills. Weight at home ~112 pounds. Taking all medications.   Cardiac Studies: - [facility] 12/19: Prox LAD lesion is 40% stenosed. Nonobstructed CAD, severe LV systolic dysfunction, mild pulm hytertension and mild elevated LVEDP, preserved CO-->Recommend: Medical therapy for congestive heart failure and nonobstructive CAD.  Fick CO: 5.29 L/min Fick CI: 5.17 L/min RV systolic: 42 mm Hg RV diastolic: 1 mm Hg RV EDP: 7 mm Hg PA systolic: 40 mm Hg PA diastolic: 17 mm Hg PA mean: 28 mm Hg LV systolic: 001 mm Hg LV diastolic: 6 mm Hg LVEDP: 10 mm Hg  - Echo 12/19 EF: 30-35% with diffuse hypokinesis. Cath 12/19 nonobstructive CAD. - Echo 8/20 EF: 25-30% with Grade II DD.  PFTs 06/11/19  FEV1 0.96L (55%) FVC 1.61L (68%) Ratio60% FEF25-75  0.40 (29%) DLCO 50%   Given her symptoms she was set up for outpatient cardiac cath.   Hospital Course     The patient underwent cardiac cath as noted above with a pLAD lesion positive by DFR. Treated with PCI/DESx1. Plan for DAPT with ASA/plavix along  with Xarelto for at least one month then plan to stop ASA. The patient was seen by cardiac rehab while in short stay. There were no observed complications post cath. Radial cath site was re-evaluated prior to discharge and found to be stable without any complications. Instructions/precautions regarding cath site care were given prior to discharge. Given that the patient lives in [place] and possible bad road conditions, ok for discharge at 6pm per Dr. Ellyn Hack.   Kimaria Struthers was seen by Dr. Ellyn Hack and determined stable for discharge home. Follow up with our office has been arranged. Medications are listed below. Pertinent changes include addition of plavix.  _____________  Cath/PCI Registry Performance & Quality Measures: 1. Aspirin prescribed? - Yes 2. ADP Receptor Inhibitor (Plavix/Clopidogrel, Brilinta/Ticagrelor or Effient/Prasugrel) prescribed (includes medically managed patients)? - Yes 3. High Intensity Statin (Lipitor 40-28m or Crestor 20-49m prescribed? - No, statin intolerant. On PCSK9 4. For EF <40%, was ACEI/ARB prescribed? - No - Reason:  blood pressures have been to soft 5. For EF <40%, Aldosterone Antagonist (Spironolactone or Eplerenone) prescribed? - No - Reason:  blood pressures  have been to soft 6. Cardiac Rehab Phase II ordered (Included Medically managed Patients)? - Yes _____________  Discharge Vitals Blood pressure (!) 111/37, pulse 82, temperature 97.7 F (36.5 C), temperature source Oral, resp. rate 19, height 5' (1.524 m), weight 51.2 kg, SpO2 94 %.  Filed Weights   10/13/20 0925  Weight: 51.2 kg    Last Labs & Radiologic Studies    CBC No results for input(s): WBC, NEUTROABS, HGB, HCT, MCV, PLT in the last 72 hours. Basic Metabolic Panel No results for input(s): NA, K, CL, CO2, GLUCOSE, BUN, CREATININE, CALCIUM, MG, PHOS in the last 72 hours. Liver Function Tests No results for input(s): AST, ALT, ALKPHOS, BILITOT, PROT, ALBUMIN in the last 72  hours. No results for input(s): LIPASE, AMYLASE in the last 72 hours. High Sensitivity Troponin:   Recent Labs  Lab 10/09/20 1818  TROPONINIHS 10    BNP Invalid input(s): POCBNP D-Dimer No results for input(s): DDIMER in the last 72 hours. Hemoglobin A1C No results for input(s): HGBA1C in the last 72 hours. Fasting Lipid Panel No results for input(s): CHOL, HDL, LDLCALC, TRIG, CHOLHDL, LDLDIRECT in the last 72 hours. Thyroid Function Tests No results for input(s): TSH, T4TOTAL, T3FREE, THYROIDAB in the last 72 hours.  Invalid input(s): FREET3 _____________  CT HEAD WO CONTRAST  Result Date: 10/09/2020 CLINICAL DATA:  Dizziness and lightheadedness of sudden onset at 12:10 p.m. No reported injury. EXAM: CT HEAD WITHOUT CONTRAST TECHNIQUE: Contiguous axial images were obtained from the base of the skull through the vertex without intravenous contrast. COMPARISON:  10/19/2018 head CT. FINDINGS: Brain: Chronic small left cerebellar hemisphere focus of encephalomalacia. No evidence of parenchymal hemorrhage or extra-axial fluid collection. No mass lesion, mass effect, or midline shift. No CT evidence of acute infarction. Nonspecific mild subcortical and periventricular white matter hypodensity, most in keeping with chronic small vessel ischemic change. Generalized mild cerebral volume loss. No ventriculomegaly. Vascular: No acute abnormality. Skull: No evidence of calvarial fracture. Sinuses/Orbits: The visualized paranasal sinuses are essentially clear. Other:  The mastoid air cells are unopacified. IMPRESSION: 1. No evidence of acute intracranial abnormality. 2. Chronic small left cerebellar hemisphere focus of encephalomalacia. 3. Generalized mild cerebral volume loss and mild chronic small vessel ischemic changes in the cerebral white matter. Electronically Signed   By: Ilona Sorrel M.D.   On: 10/09/2020 15:13   CARDIAC CATHETERIZATION  Result Date: 10/13/2020  Prox LAD-1 lesion is 40%  stenosed. DFR negative at 0.93  Prox LAD-2 lesion is 75% stenosed. DFR +0.83  A drug-eluting stent was successfully placed covering the lesion #2, using a STENT RESOLUTE ONYX 2.5X15. Postdilated to 2.75 mm  Post intervention, there is a 0% residual stenosis.  Successful DFR guided DES PCI of mid LAD -> using Resolute Onyx DES 2.5 mm x 15 mm postdilated to 2.75 mm Recommendations  Will attempt same-day discharge  See recommendations for antiplatelet/anticoagulant therapy Glenetta Hew, MD  CARDIAC CATHETERIZATION  Result Date: 10/13/2020  Prox LAD lesion is 40% stenosed.  Prox LAD to Mid LAD lesion is 65% stenosed.  Ost LM to Mid LM lesion is 20% stenosed.  Findings: Ao = 154/53 (98) LV = 155/17 RA = 1 RV = 44/6 PA = 40/17 (30) PCW = 18 (v = 26) Fick cardiac output/index = 4.1/2.8 PVR = 2.9 WU FA sat = 99% PA sat =66%, 67% Assessment: 1. Diffuse mild CAD with borderline lesion (60-70%) in mid LAD 2. Normal LFEV 60% 3. Mild PAH Plan/Discussion: LAD lesion looks  borderline. Given chest pain symptoms will proceed with DFR LAD lesion to assess for hemodynamic significance. Glori Bickers, MD 12:30 PM    Disposition   Pt is being discharged home today in good condition.  Follow-up Plans & Appointments     Follow-up Information    Bensimhon, Shaune Pascal, MD Follow up on 10/21/2020.   Specialty: Cardiology Why: at 12pm for your follow up appt. Contact information: [address] [place] [address] [phone]              Discharge Instructions    Amb Referral to Cardiac Rehabilitation   Complete by: As directed    Diagnosis: Coronary Stents   After initial evaluation and assessments completed: Virtual Based Care may be provided alone or in conjunction with Phase 2 Cardiac Rehab based on patient barriers.: Yes       Discharge Medications   Allergies as of 10/13/2020      Reactions   Contrast Media [iodinated Diagnostic Agents] Shortness Of Breath    Iohexol Other (See Comments)   PASSED OUT DURING THE TEST   Metformin Swelling   Face and hands became swollen   Statins Swelling, Other (See Comments)   They make the patient hurt "all over" Myalgia, also   Bextra [valdecoxib] Nausea And Vomiting, Swelling   Shut down my kidneys    Cefdinir Other (See Comments)   Pancreatitis   Cozaar [losartan Potassium] Other (See Comments)   dizziness   Nsaids Other (See Comments)   GI intolerance and pain all over, tolerates ibu   Zetia [ezetimibe] Swelling   Rofecoxib Other (See Comments)   "VIOXX"- "SHUT DOWN MY KIDNEYS"   Sulfa Antibiotics Nausea And Vomiting, Other (See Comments)   Pancreatitis      Medication List    STOP taking these medications   predniSONE 50 MG tablet Commonly known as: DELTASONE     TAKE these medications   acetaminophen 500 MG tablet Commonly known as: TYLENOL Take 500 mg by mouth every 6 (six) hours as needed for moderate pain.   albuterol 108 (90 Base) MCG/ACT inhaler Commonly known as: VENTOLIN HFA Inhale 2 puffs into the lungs every 6 (six) hours as needed for wheezing or shortness of breath.   aspirin EC 81 MG tablet Take 1 tablet (81 mg total) by mouth daily. Swallow whole. What changed: additional instructions   B-D UF III MINI PEN NEEDLES 31G X 5 MM Misc Generic drug: Insulin Pen Needle 1 each by Does not apply route 4 (four) times daily.   blood glucose meter kit and supplies Use up to four times daily as directed   clopidogrel 75 MG tablet Commonly known as: Plavix Take 1 tablet (75 mg total) by mouth daily.   cyclobenzaprine 10 MG tablet Commonly known as: FLEXERIL TAKE 1 TABLET THREE TIMES DAILY FOR MUSCLE SPASM What changed: See the new instructions.   digoxin 0.125 MG tablet Commonly known as: LANOXIN Take 0.5 tablets (0.0625 mg total) by mouth daily.   empagliflozin 10 MG Tabs tablet Commonly known as: JARDIANCE Take 1 tablet (10 mg total) by mouth daily before  breakfast.   Fluticasone-Salmeterol 100-50 MCG/DOSE Aepb Commonly known as: ADVAIR Inhale 1 puff into the lungs 2 (two) times daily.   furosemide 20 MG tablet Commonly known as: LASIX Take 2 tablets daily. You may take an extra dose for increase weight gain orswelling What changed:   how much to take  how to take this  when to  take this  additional instructions   gabapentin 300 MG capsule Commonly known as: Neurontin Take 1 capsule (300 mg total) by mouth 3 (three) times daily.   glimepiride 2 MG tablet Commonly known as: AMARYL Take 1-2 tablets (2-4 mg total) by mouth daily with breakfast. What changed: how much to take   metoprolol succinate 25 MG 24 hr tablet Commonly known as: TOPROL-XL Take 1 tablet (25 mg total) by mouth daily.   nitroGLYCERIN 0.3 MG SL tablet Commonly known as: Nitrostat Place 1 tablet (0.3 mg total) under the tongue every 5 (five) minutes as needed for chest pain.   OneTouch Delica Plus [id number] Misc CHECK GLUCOSE UP TO 4 TIMES DAILY AS DIRECTED Dx E11.9   OneTouch Verio test strip Generic drug: glucose blood TEst BS BID and as needed Dx E11.9   Opcon-A 0.027-0.315 % Soln Generic drug: Naphazoline-Pheniramine Place 1 drop into both eyes daily as needed (Burning and itching eyes).   Repatha 140 MG/ML Sosy Generic drug: Evolocumab Inject 140 mg into the skin every 14 (fourteen) days.   rivaroxaban 2.5 MG Tabs tablet Commonly known as: XARELTO Take 1 tablet (2.5 mg total) by mouth 2 (two) times daily.   Tyler Aas FlexTouch 100 UNIT/ML FlexTouch Pen Generic drug: insulin degludec Inject 10 Units into the skin daily.   Vitamin D 50 MCG (2000 UT) tablet Take 4,000 Units by mouth daily.       Allergies Allergies  Allergen Reactions  . Contrast Media [Iodinated Diagnostic Agents] Shortness Of Breath  . Iohexol Other (See Comments)    PASSED OUT DURING THE TEST    . Metformin Swelling    Face and hands became swollen  . Statins  Swelling and Other (See Comments)    They make the patient hurt "all over" Myalgia, also  . Bextra [Valdecoxib] Nausea And Vomiting and Swelling    Shut down my kidneys   . Cefdinir Other (See Comments)    Pancreatitis   . Cozaar [Losartan Potassium] Other (See Comments)    dizziness  . Nsaids Other (See Comments)    GI intolerance and pain all over, tolerates ibu   . Zetia [Ezetimibe] Swelling  . Rofecoxib Other (See Comments)    "VIOXX"- "SHUT DOWN MY KIDNEYS"  . Sulfa Antibiotics Nausea And Vomiting and Other (See Comments)    Pancreatitis    Outstanding Labs/Studies   N/a   Duration of Discharge Encounter   Greater than 30 minutes including physician time.  Signed, Reino Bellis, NP 10/13/2020, 3:01 PM

## 2020-10-13 NOTE — Progress Notes (Signed)
Up and walked and tolerated well; no c/o pain

## 2020-10-13 NOTE — Interval H&P Note (Signed)
History and Physical Interval Note:  10/13/2020 10:47 AM  Hannah French  has presented today for surgery, with the diagnosis of exertional chest pain and heart failure.  The various methods of treatment have been discussed with the patient and family. After consideration of risks, benefits and other options for treatment, the patient has consented to  Procedure(s): RIGHT/LEFT HEART CATH AND CORONARY ANGIOGRAPHY (N/A) and possible coronary angioplastyas a surgical intervention.  [name] history has been reviewed, patient examined, no change in status, stable for surgery.  I have reviewed the patient's chart and labs.  Questions were answered to the patient's satisfaction.     Lido Maske

## 2020-10-14 ENCOUNTER — Encounter: Payer: Self-pay | Admitting: Family Medicine

## 2020-10-14 MED FILL — Clopidogrel Bisulfate Tab 75 MG (Base Equiv): ORAL | Qty: 4 | Status: AC

## 2020-10-15 ENCOUNTER — Telehealth (HOSPITAL_COMMUNITY): Payer: Self-pay

## 2020-10-15 NOTE — Telephone Encounter (Signed)
Pharmacy Transitions of Care Follow-up Telephone Call  Date of discharge: 10/13/20  Discharge Diagnosis: R/L Heart Cath and Coronary Angioplasty  How have you been since you were released from the hospital? She has been very tired but she feels a little bit better after being discharged from the hospital, she hasn't noticed any problems with her mediations but she is concerned about mild arm pain after removing board from arm. Has had some chest congestion with specs of blood in yesterday but hasn't noticed anything new since. Patient states she has a history of chest congestion. Patient was encouraged to call provider and alert them about mild arm pain and chest congestion.  Medication changes made at discharge: yes   Medication changes obtained and verified? yes    Medication Accessibility:  Home Pharmacy: [facility]  Was the patient provided with refills on discharged medications? yes   Have all prescriptions been transferred from [facility] to home pharmacy? yes   Is the patient able to afford medications? yes  Medication Review:  CLOPIDOGREL (PLAVIX) Clopidogrel 75 mg once daily.  - Educated patient on expected duration of therapy of 1 year with clopidogrel. - Advised patient that aspirin will be continued indefinitely.  - Advised patient of medications to avoid (NSAIDs, ASA)  - Educated that Tylenol (acetaminophen) will be the preferred analgesic to prevent risk of bleeding  - Emphasized importance of monitoring for signs and symptoms of bleeding (abnormal bruising, prolonged bleeding, nose bleeds, bleeding from gums, discolored urine, black tarry stools)  - Advised patient to alert all providers of anticoagulation therapy prior to starting a new medication or having a procedure    Follow-up Appointments:  Patient has follow up with Cardiology on 10/21/20 for ECHO and another follow up with Dr. Haroldine Laws in Cardiology on 11/15/20.   If their condition worsens, is the pt aware  to call PCP or go to the Emergency Dept.? yes  Final Patient Assessment: Patient is improving after being discharged. Has follow ups scheduled with Cardiology. No issues with medications at this time.

## 2020-10-18 ENCOUNTER — Encounter (HOSPITAL_COMMUNITY): Payer: Self-pay

## 2020-10-18 ENCOUNTER — Ambulatory Visit (HOSPITAL_COMMUNITY): Payer: Medicare Other

## 2020-10-20 NOTE — Progress Notes (Signed)
ADVANCED HF CLINIC NOTE  Date:  10/21/2020   PCP:  Dettinger, Fransisca Kaufmann, MD  Cardiologist:  Glori Bickers, MD  Electrophysiologist:  None   Referring Provider: Melina Copa PA-C  Chief Complaint:  HF  History of Present Illness:   Hannah French is a 80 y.o. 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   She was seen in the office on 08/26/2018 by Dr. Burt Knack and was noted to have shortness of breath felt to be related to COPD and URI. She did have a progression on her EKG from previous IVCD to left bundle branch block. An echo was done which showed a decline in LV function with EF 30-35% with diffuse hypokinesis (prior was normal). With new LBBB and significant change in LV function she was sent for right and left heart cath which was done on 09/16/2018 which showed nonobstructive CAD with diffuse luminal irregularities involving the left main, left circumflex and RCA. There was 40-50% calcific stenosis of the proximal LAD. There was mild pulmonary hypertension and mild elevation in LVEDP with preserved cardiac output. Medical therapy was recommended.. She is not able to take statin drugs or aspirin because of intolerance to both medicines. She has stated that she is a "free bleeder" and cannot tolerate any antiplatelet therapy.   Her medical therapy has been limited by low BP and dizziness (beta blocker reduced, losartan stopped). Corlanor was started for elevated HR and soft BP but not yet approved, F/u echo 04/28/19 showed EF 25-30%, diffuse HK, Grade II DD,  mildly dilated LA, mild MR.   She was seen for the first time in the HF Clinic in 8/20. Was doing fairly well. Felt  SOB mostly due to COPD. Hall walk with sats 93-96%. Unable to titrate HF meds due to low BP and dizziness  Underwent stenting of bilateral common iliac artery stent placement extending into the distal aorta on 12/31/19  Returned for f/u 8/21. Over past few weeks has been having pain and swelling in the right groin. No CP. Chronically SOB and exhausted. No claudication. No LE wounds. LE U/S ordered and normal.  On 1/18 had R and L cath that showed 75% lesion LAD. Intervention completed with stent placed mid LAD. Recommend to resume Rivaroxaban, at currently prescribed dose and frequency on 10/13/2020. Recommend concurrent antiplatelet therapy of Aspirin 81 mg for 1 month and Clopidogrel 90m daily for 12 months . If there are significant bleeding complications, could make clopidogrel interrupted after 6 months.   Today she returns for HF follow up. Overall feeling ok but remains tired. Main complaint today is regarding her toe pain. No longer having chest tightness. Remains SOB with exertion. No chest pain. Denies PND/Orthopnea. Appetite ok. No fever or chills. Weight at home  pounds. Taking all medications. Unable to take gabapentin. Smoking 1 PPD.     Cardiac Studies:   LHC/RHC  10/13/20  Preserved cardia output, low filling pressure, and EF 60%   Prox LAD-1 lesion is 40% stenosed. DFR negative at 0.93  Prox LAD-2 lesion is 75% stenosed. DFR +0.83  A drug-eluting stent was successfully placed covering the lesion #2, using a STENT RESOLUTE ONYX 2.5X15. Postdilated to 2.75 mm  Post intervention, there is a  0% residual stenosis.  Successful DFR guided DES PCI of mid LAD -> using Resolute Onyx DES 2.5 mm x 15 mm postdilated to 2.75 mm  See recommendations for antiplatelet/anticoagulant therapy   - R/LHC 12/19: Prox LAD lesion is 40% stenosed. Nonobstructed CAD, severe LV systolic dysfunction, mild pulm hytertension and mild elevated LVEDP, preserved CO-->Recommend: Medical therapy for  congestive heart failure and nonobstructive CAD.  Fick CO: 5.29 L/min Fick CI: 8.34 L/min RV systolic: 42 mm Hg RV diastolic: 1 mm Hg RV EDP: 7 mm Hg PA systolic: 40 mm Hg PA diastolic: 17 mm Hg PA mean: 28 mm Hg LV systolic: 196 mm Hg LV diastolic: 6 mm Hg LVEDP: 10 mm Hg  - Echo 12/19 EF: 30-35% with diffuse hypokinesis. Cath 12/19 nonobstructive CAD. - Echo 8/20 EF: 25-30% with Grade II DD.  PFTs 06/11/19  FEV1 0.96L (55%) FVC 1.61L (68%) Ratio60% FEF25-75  0.40 (29%) DLCO 50%  Past Medical History:  Diagnosis Date  . Abdominal bruit   . Bladder cancer ([place])   . CAD (coronary artery disease)    a. nonobstructive by cath 08/2018.  . Carotid bruit   . Chronic combined systolic and diastolic CHF (congestive heart failure) ([place])   . CKD (chronic kidney disease), stage II   . Congestive heart failure (CHF) ([place])   . Diabetes mellitus    Type II  . Dyslipidemia   . Heart murmur   . Mild pulmonary hypertension ([place])   . Mitral valve prolapse    a. not seen on most recent echoes. Mild MR now.  Marland Kitchen NICM (nonischemic cardiomyopathy) ([place])   . Osteoporosis   . Pancreatitis, acute   . Pseudogout   . PVD (peripheral vascular disease) ([place])    a. 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  . Sinus tachycardia   . Tobacco abuse     Past Surgical History:  Procedure Laterality Date  . ABDOMINAL AORTOGRAM W/LOWER EXTREMITY N/A 12/31/2019   Procedure: ABDOMINAL AORTOGRAM W/LOWER EXTREMITY;  Surgeon: Wellington Hampshire, MD;  Location: [facility];  Service: Cardiovascular;  Laterality: N/A;  . ABDOMINAL AORTOGRAM W/LOWER EXTREMITY N/A 09/01/2020   Procedure: ABDOMINAL AORTOGRAM W/LOWER EXTREMITY;  Surgeon: Wellington Hampshire, MD;  Location: [facility];  Service: Cardiovascular;  Laterality: N/A;  . APPENDECTOMY    . CORONARY STENT INTERVENTION N/A 10/13/2020   Procedure: CORONARY STENT INTERVENTION;  Surgeon: Leonie Man, MD;  Location: [facility];  Service: Cardiovascular;  Laterality: N/A;  . Hysterectomy-type unspecified    . INTRAVASCULAR PRESSURE WIRE/FFR STUDY N/A 10/13/2020   Procedure: INTRAVASCULAR PRESSURE WIRE/FFR STUDY;  Surgeon: Leonie Man, MD;  Location: [facility];  Service: Cardiovascular;  Laterality: N/A;  . LAPAROSCOPIC CHOLECYSTECTOMY  2009  . PERIPHERAL VASCULAR INTERVENTION  12/31/2019   Procedure: PERIPHERAL VASCULAR INTERVENTION;  Surgeon: Wellington Hampshire, MD;  Location: [facility];  Service: Cardiovascular;;  Bilateral [place]  . RIGHT/LEFT HEART CATH AND CORONARY ANGIOGRAPHY N/A 09/16/2018   Procedure: RIGHT/LEFT HEART CATH AND CORONARY ANGIOGRAPHY;  Surgeon: Sherren Mocha, MD;  Location: [facility];  Service: Cardiovascular;  Laterality: N/A;  . RIGHT/LEFT HEART CATH AND CORONARY ANGIOGRAPHY N/A 10/13/2020   Procedure: RIGHT/LEFT HEART CATH AND CORONARY ANGIOGRAPHY;  Surgeon: Jolaine Artist, MD;  Location: [facility];  Service: Cardiovascular;  Laterality: N/A;    Current Medications: Current Meds  Medication Sig  . acetaminophen (TYLENOL) 500 MG tablet  Take 500 mg by mouth every 6 (six) hours as needed for moderate pain.  Marland Kitchen albuterol (VENTOLIN HFA) 108 (90 Base) MCG/ACT inhaler Inhale 2 puffs into the lungs every 6 (six) hours as needed for wheezing or shortness of breath.  Marland Kitchen aspirin EC 81 MG tablet Take 1 tablet (81 mg total) by mouth daily. Swallow whole.  . blood glucose meter kit and supplies Use up to four times daily as directed  . Cholecalciferol (VITAMIN D) 50 MCG (2000 UT) tablet Take 4,000 Units by mouth daily.   . clopidogrel (PLAVIX) 75 MG tablet Take 1 tablet (75 mg total) by mouth daily.  . cyclobenzaprine (FLEXERIL) 10 MG tablet TAKE 1 TABLET THREE TIMES DAILY FOR MUSCLE SPASM  . empagliflozin (JARDIANCE) 10 MG TABS tablet Take 1 tablet (10 mg total) by mouth daily before breakfast.  . Evolocumab (REPATHA) 140 MG/ML  SOSY Inject 140 mg into the skin every 14 (fourteen) days.  . Fluticasone-Salmeterol (ADVAIR) 100-50 MCG/DOSE AEPB Inhale 1 puff into the lungs 2 (two) times daily.  . furosemide (LASIX) 20 MG tablet Take 2 tablets daily. You may take an extra dose for increase weight gain orswelling  . glimepiride (AMARYL) 2 MG tablet Take 1-2 tablets (2-4 mg total) by mouth daily with breakfast.  . glucose blood (ONETOUCH VERIO) test strip TEst BS BID and as needed Dx E11.9  . insulin degludec (TRESIBA FLEXTOUCH) 100 UNIT/ML FlexTouch Pen Inject 10 Units into the skin daily.  . Insulin Pen Needle (B-D UF III MINI PEN NEEDLES) 31G X 5 MM MISC 1 each by Does not apply route 4 (four) times daily.  . Lancets (ONETOUCH DELICA PLUS [id number]) MISC CHECK GLUCOSE UP TO 4 TIMES DAILY AS DIRECTED Dx E11.9  . metoprolol succinate (TOPROL-XL) 25 MG 24 hr tablet Take 1 tablet (25 mg total) by mouth daily.  . Naphazoline-Pheniramine (OPCON-A) 0.027-0.315 % SOLN Place 1 drop into both eyes daily as needed (Burning and itching eyes).  . nitroGLYCERIN (NITROSTAT) 0.3 MG SL tablet Place 1 tablet (0.3 mg total) under the tongue every 5 (five) minutes as needed for chest pain.  . rivaroxaban (XARELTO) 2.5 MG TABS tablet Take 1 tablet (2.5 mg total) by mouth 2 (two) times daily.     Allergies:   Contrast media [iodinated diagnostic agents], Iohexol, Metformin, Statins, Bextra [valdecoxib], Cefdinir, Cozaar [losartan potassium], Gabapentin, Nsaids, Zetia [ezetimibe], Rofecoxib, and Sulfa antibiotics   Social History   Socioeconomic History  . Marital status: Divorced    Spouse name: Not on file  . Number of children: 2  . Years of education: Some College   . Highest education level: Some college, no degree  Occupational History  . Occupation: Retired  Tobacco Use  . Smoking status: Current Every Day Smoker    Packs/day: 1.00    Years: 50.00    Pack years: 50.00    Types: Cigarettes  . Smokeless tobacco: Never Used   Vaping Use  . Vaping Use: Never used  Substance and Sexual Activity  . Alcohol use: No  . Drug use: No  . Sexual activity: Not Currently  Other Topics Concern  . Not on file  Social History Narrative  . Not on file   Social Determinants of Health   Financial Resource Strain: Not on file  Food Insecurity: Not on file  Transportation Needs: Not on file  Physical Activity: Not on file  Stress: Not on file  Social Connections: Not on file     Family History:  [name] family history includes COPD in her brother; Congestive Heart Failure in her brother and daughter; Heart attack in her brother and maternal uncle; Heart disease in her sister; Heart failure in her mother; Hypertension in her sister; Ovarian cancer in her sister; Rheum arthritis in her son. There is no history of Coronary artery disease.   EKGs/Labs/Other Studies Reviewed:    Recent Labs: 11/19/2019: B Natriuretic Peptide 153.4 10/09/2020: ALT 17 10/21/2020: BUN 39; Creatinine, Ser 1.50; Hemoglobin 9.9; Platelets 272; Potassium 4.7; Sodium 137  Recent Lipid Panel    Component Value Date/Time   CHOL 157 02/17/2020 1159   TRIG 129 02/17/2020 1159   TRIG 82 06/09/2014 1028   HDL 41 02/17/2020 1159   HDL 57 06/09/2014 1028   CHOLHDL 3.8 02/17/2020 1159   CHOLHDL 3.5 02/06/2013 0932   VLDL 20 02/06/2013 0932   LDLCALC 93 02/17/2020 1159   LDLCALC 131 (H) 06/09/2014 1028    PHYSICAL EXAM:    Vitals:   10/21/20 1022  BP: 112/70  Pulse: 81  SpO2: 97%     Wt Readings from Last 3 Encounters:  10/21/20 51 kg (112 lb 6.4 oz)  10/13/20 51.2 kg (112 lb 14 oz)  10/08/20 51.2 kg (112 lb 12.8 oz)   General:  Arrived in a wheel chair.  No resp difficulty HEENT: normal Neck: supple. no JVD. Carotids 2+ bilat; no bruits. No lymphadenopathy or thryomegaly appreciated. Cor: PMI nondisplaced. Regular rate & rhythm. No rubs, gallops or murmurs. Lungs: EW throughout.  Abdomen: soft, nontender, nondistended. No  hepatosplenomegaly. No bruits or masses. Good bowel sounds. Extremities: no cyanosis, clubbing, rash, edema Neuro: alert & orientedx3, cranial nerves grossly intact. moves all 4 extremities w/o difficulty. Affect pleasant   ASSESSMENT & PLAN:   1. CAD - 1/19/22S/P DES mid LAD lesion.  -Continue Rivaroxaban + aspirin 81 mg for 1 month +  Clopidogrel 72m daily for 12 months . If there are significant bleeding complications, could make clopidogrel interrupted after 6 months. - No chest pain.  - Stop aspirin 11/13/20 - On repatha  2. Chronic systolic HF  - Due to NICM (? LBBB but not very wide)  - Echo 12/19 EF 30-35% with diffuse hypokinesis. Cath 12/19 nonobstructive CAD. - Echo 8/20 EF 25-30% with Grade II DD. - Today ECHO EF 40-45% !  - NYHA III. Volume status stabl.e  - - Intolerant of meds due to low BP and dizziness (She does have a BP differential between arms so should likely be using the right arm to obtain accurate readings) - Continue Toprol 25 daily. - Continue digoxin 0.125 daily. - Continue Jardiance. - Losartan stopped due to low BP and hyperkalemia. - PFTs quite severe and likely major limiting factor   3. 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 AFletcher Anon- She is s/p stenting of bilateral common iliac artery stent placement extending into the distal aorta.  -Unable to take gabapentin. . - Continue xarelto 2.5 mg daily.   5. COPD with ongoing tobacco use. - PFTs 9/20 with severe COPD.   6. DM2 - Continue Jardiance. - Followed by PCP.  Follow up in 3 months with Dr BHaroldine Laws   SJeanmarie Hubert NP  10/21/2020 5:21 PM     [facility] [address] [place] [place]  [id number]: [phone] Fax: [phone]  Patient seen and examined with the above-signed Advanced Practice Provider and/or Housestaff. I personally  reviewed laboratory data, imaging studies and relevant notes. I  independently examined the patient and formulated the important aspects of the plan. I have edited the note to reflect any of my changes or salient points. I have personally discussed the plan with the patient and/or family.  Recent cath results reviewed with her. Had stent to LAD. Angina has resolved. [place] cath with mild PH. PCWP 18 with v wave to 26. CI 2.8.   Feeling better Echo today reviewed personally EF 40-45% Volume status ok. No orthopnea or PND. Still smoking  General:  Sitting in chair. No resp difficulty HEENT: normal Neck: supple. no JVD. Carotids 2+ bilat; no bruits. No lymphadenopathy or thryomegaly appreciated. Cor: PMI nondisplaced. Regular rate & rhythm. No rubs, gallops or murmurs. Lungs: decreased throughtout Abdomen: soft, nontender, nondistended. No hepatosplenomegaly. No bruits or masses. Good bowel sounds. Extremities: no cyanosis, clubbing, rash, edema Neuro: alert & orientedx3, cranial nerves grossly intact. moves all 4 extremities w/o difficulty. Affect pleasant  Angina has resolved after LAD stenting. [place] numbers not too bad. EF improved on echo today. Continue current meds for now. As BP improves can continue attempts at titrating GDMT. Discussed smoking cessation.   Glori Bickers, MD  12:42 PM

## 2020-10-21 ENCOUNTER — Encounter (HOSPITAL_COMMUNITY): Payer: Self-pay

## 2020-10-21 ENCOUNTER — Other Ambulatory Visit: Payer: Self-pay

## 2020-10-21 ENCOUNTER — Ambulatory Visit (HOSPITAL_BASED_OUTPATIENT_CLINIC_OR_DEPARTMENT_OTHER)
Admission: RE | Admit: 2020-10-21 | Discharge: 2020-10-21 | Disposition: A | Discharge status: Home / Self Care | Payer: Medicare Other | Source: Ambulatory Visit | Attending: Family Medicine | Admitting: Family Medicine

## 2020-10-21 ENCOUNTER — Ambulatory Visit (HOSPITAL_COMMUNITY)
Admission: RE | Admit: 2020-10-21 | Discharge: 2020-10-21 | Disposition: A | Discharge status: Home / Self Care | Payer: Medicare Other | Source: Ambulatory Visit | Attending: Adult Health | Admitting: Adult Health

## 2020-10-21 VITALS — BP 112/70 | HR 81 | Wt 112.4 lb

## 2020-10-21 DIAGNOSIS — E119 Type 2 diabetes mellitus without complications: Secondary | ICD-10-CM | POA: Diagnosis not present

## 2020-10-21 DIAGNOSIS — I5022 Chronic systolic (congestive) heart failure: Secondary | ICD-10-CM

## 2020-10-21 DIAGNOSIS — I739 Peripheral vascular disease, unspecified: Secondary | ICD-10-CM | POA: Diagnosis not present

## 2020-10-21 DIAGNOSIS — I2510 Atherosclerotic heart disease of native coronary artery without angina pectoris: Secondary | ICD-10-CM | POA: Insufficient documentation

## 2020-10-21 DIAGNOSIS — Z720 Tobacco use: Secondary | ICD-10-CM

## 2020-10-21 DIAGNOSIS — J449 Chronic obstructive pulmonary disease, unspecified: Secondary | ICD-10-CM | POA: Insufficient documentation

## 2020-10-21 DIAGNOSIS — E785 Hyperlipidemia, unspecified: Secondary | ICD-10-CM | POA: Diagnosis not present

## 2020-10-21 LAB — CBC
HCT: 29.4 % — ABNORMAL LOW (ref 36.0–46.0)
Hemoglobin: 9.9 g/dL — ABNORMAL LOW (ref 12.0–15.0)
MCH: 36.5 pg — ABNORMAL HIGH (ref 26.0–34.0)
MCHC: 33.7 g/dL (ref 30.0–36.0)
MCV: 108.5 fL — ABNORMAL HIGH (ref 80.0–100.0)
Platelets: 272 10*3/uL (ref 150–400)
RBC: 2.71 MIL/uL — ABNORMAL LOW (ref 3.87–5.11)
RDW: 18.7 % — ABNORMAL HIGH (ref 11.5–15.5)
WBC: 10.1 10*3/uL (ref 4.0–10.5)
nRBC: 0.3 % — ABNORMAL HIGH (ref 0.0–0.2)

## 2020-10-21 LAB — BASIC METABOLIC PANEL
Anion gap: 10 (ref 5–15)
BUN: 39 mg/dL — ABNORMAL HIGH (ref 8–23)
CO2: 28 mmol/L (ref 22–32)
Calcium: 9.3 mg/dL (ref 8.9–10.3)
Chloride: 99 mmol/L (ref 98–111)
Creatinine, Ser: 1.5 mg/dL — ABNORMAL HIGH (ref 0.44–1.00)
GFR, Estimated: 35 mL/min — ABNORMAL LOW (ref >60)
Glucose, Bld: 282 mg/dL — ABNORMAL HIGH (ref 70–99)
Potassium: 4.7 mmol/L (ref 3.5–5.1)
Sodium: 137 mmol/L (ref 135–145)

## 2020-10-21 LAB — ECHOCARDIOGRAM COMPLETE
Area-P 1/2: 5.38 cm2
Calc EF: 40 %
S' Lateral: 3.9 cm
Single Plane A2C EF: 33.9 %
Single Plane A4C EF: 44.7 %

## 2020-10-21 NOTE — Progress Notes (Signed)
CSW consulted to meet with pt due to concerns of depression.  Pt dtr reported to CMA that the pt sometimes expresses feelings of not wanting to do this anymore and pt had previously worked with CCM CSW with her PCP office to work on depression.  CSW met with pt and dtr in room to discuss.  Pt states she sometimes get down due to boredom as the pandemic has made it hard to do things.  She also struggles with chronic medical conditions and new toe pain following stent placement that make her feel depressed sometimes but overall reports that she is feeling well and that any moments of feeling down are reasonable given her condition.  CSW brought up previous struggle with depression and need to go on zoloft and patient reports that yes she was depressed at that time (per CCM notes this was related to loss of a loved one) and medication helped when she was going through a rough patch but now she feels much better and doesn't feel as if she needs any interventions at this time.  No interventions necessary at this time- will continue to follow through clinic and assist as needed  Jorge Ny, [place] Worker [facility] Desk#: [phone] Cell#: [phone]

## 2020-10-21 NOTE — Progress Notes (Signed)
Echocardiogram 2D Echocardiogram has been performed.  Hannah French 10/21/2020, 10:10 AM

## 2020-10-21 NOTE — Patient Instructions (Signed)
On 11/13/2020 stop aspirin   Labs today We will only contact you if something comes back abnormal or we need to make some changes. Otherwise no news is good news!  Your physician recommends that you schedule a follow-up appointment in: 3 months with Dr Haroldine Laws  If you have any questions or concerns before your next appointment please send [place] a message through [place] or call our office at [phone].    TO LEAVE A MESSAGE FOR THE NURSE SELECT OPTION 2, PLEASE LEAVE A MESSAGE INCLUDING: . YOUR NAME . DATE OF BIRTH . CALL BACK NUMBER . REASON FOR CALL**this is important as we prioritize the call backs  [place] AS LONG AS YOU CALL BEFORE 4:00 PM  At the [facility], you and your health needs are our priority. As part of our continuing mission to provide you with exceptional heart care, we have created designated Provider Care Teams. These Care Teams include your primary Cardiologist (physician) and Advanced Practice Providers (APPs- Physician Assistants and Nurse Practitioners) who all work together to provide you with the care you need, when you need it.   You may see any of the following providers on your designated Care Team at your next follow up: Marland Kitchen Dr Glori Bickers . Dr Loralie Champagne . Darrick Grinder, NP . Lyda Jester, [place] . Audry Riles, PharmD   Please be sure to bring in all your medications bottles to every appointment.

## 2020-11-01 ENCOUNTER — Encounter: Payer: Self-pay | Admitting: Family Medicine

## 2020-11-01 ENCOUNTER — Ambulatory Visit (INDEPENDENT_AMBULATORY_CARE_PROVIDER_SITE_OTHER): Payer: Medicare Other | Admitting: Family Medicine

## 2020-11-01 ENCOUNTER — Other Ambulatory Visit: Payer: Self-pay

## 2020-11-01 VITALS — BP 124/57 | HR 80 | Temp 97.0°F | Resp 20 | Ht 60.0 in | Wt 113.0 lb

## 2020-11-01 DIAGNOSIS — I96 Gangrene, not elsewhere classified: Secondary | ICD-10-CM | POA: Diagnosis not present

## 2020-11-01 DIAGNOSIS — I739 Peripheral vascular disease, unspecified: Secondary | ICD-10-CM | POA: Diagnosis not present

## 2020-11-01 MED ORDER — DULOXETINE HCL 30 MG PO CPEP: 30.0000 mg | ORAL_CAPSULE | Freq: Every day | ORAL | 1 refills | Status: DC

## 2020-11-01 NOTE — Progress Notes (Signed)
BP (!) 124/57   Pulse 80   Temp (!) 97 F (36.1 C)   Resp 20   Ht 5' (1.524 m)   Wt 113 lb (51.3 kg)   SpO2 99%   BMI 22.07 kg/m    Subjective:   Patient ID: Hannah French, female    DOB: [birth year], 80 y.o.   MRN: [id number]  HPI: Hannah French is a 80 y.o. female presenting on 11/01/2020 for No chief complaint on file.   HPI Patient is coming in today with severe pain in her left fourth toe and on the end of it developing an ulceration.  She says it started to turn dark and cold and then has had severe pain.  She does see a vascular doctor and has been seeing them but they said there is no large peripheral artery disease anymore and they have already stented that but she continues to have this issue.  The pain is very severe but patient does not want pain medicines she wants to figure out what is going on and see what can help without doing pain medicines because she cannot tolerate them.  Relevant past medical, surgical, family and social history reviewed and updated as indicated. Interim medical history since our last visit reviewed. Allergies and medications reviewed and updated.  Review of Systems  Constitutional: Negative for chills and fever.  Eyes: Negative for visual disturbance.  Respiratory: Negative for chest tightness and shortness of breath.   Cardiovascular: Negative for chest pain and leg swelling.  Musculoskeletal: Negative for back pain and gait problem.  Skin: Positive for color change, rash and wound.  Neurological: Negative for light-headedness and headaches.  Psychiatric/Behavioral: Negative for agitation and behavioral problems.  All other systems reviewed and are negative.   Per HPI unless specifically indicated above   Allergies as of 11/01/2020      Reactions   Contrast Media [iodinated Diagnostic Agents] Shortness Of Breath   Iohexol Other (See Comments)   PASSED OUT DURING THE TEST   Metformin Swelling   Face and hands became  swollen   Statins Swelling, Other (See Comments)   They make the patient hurt "all over" Myalgia, also   Bextra [valdecoxib] Nausea And Vomiting, Swelling   Shut down my kidneys    Cefdinir Other (See Comments)   Pancreatitis   Cozaar [losartan Potassium] Other (See Comments)   dizziness   Gabapentin    Nsaids Other (See Comments)   GI intolerance and pain all over, tolerates ibu   Zetia [ezetimibe] Swelling   Rofecoxib Other (See Comments)   "VIOXX"- "SHUT DOWN MY KIDNEYS"   Sulfa Antibiotics Nausea And Vomiting, Other (See Comments)   Pancreatitis      Medication List       Accurate as of November 01, 2020  2:29 PM. If you have any questions, ask your nurse or doctor.        acetaminophen 500 MG tablet Commonly known as: TYLENOL Take 500 mg by mouth every 6 (six) hours as needed for moderate pain.   albuterol 108 (90 Base) MCG/ACT inhaler Commonly known as: VENTOLIN HFA Inhale 2 puffs into the lungs every 6 (six) hours as needed for wheezing or shortness of breath.   aspirin EC 81 MG tablet Take 1 tablet (81 mg total) by mouth daily. Swallow whole.   B-D UF III MINI PEN NEEDLES 31G X 5 MM Misc Generic drug: Insulin Pen Needle 1 each by Does not apply route 4 (  four) times daily.   blood glucose meter kit and supplies Use up to four times daily as directed   clopidogrel 75 MG tablet Commonly known as: Plavix Take 1 tablet (75 mg total) by mouth daily.   cyclobenzaprine 10 MG tablet Commonly known as: FLEXERIL TAKE 1 TABLET THREE TIMES DAILY FOR MUSCLE SPASM   empagliflozin 10 MG Tabs tablet Commonly known as: JARDIANCE Take 1 tablet (10 mg total) by mouth daily before breakfast.   Fluticasone-Salmeterol 100-50 MCG/DOSE Aepb Commonly known as: ADVAIR Inhale 1 puff into the lungs 2 (two) times daily.   furosemide 20 MG tablet Commonly known as: LASIX Take 2 tablets daily. You may take an extra dose for increase weight gain orswelling   glimepiride 2 MG  tablet Commonly known as: AMARYL Take 1-2 tablets (2-4 mg total) by mouth daily with breakfast.   metoprolol succinate 25 MG 24 hr tablet Commonly known as: TOPROL-XL Take 1 tablet (25 mg total) by mouth daily.   nitroGLYCERIN 0.3 MG SL tablet Commonly known as: Nitrostat Place 1 tablet (0.3 mg total) under the tongue every 5 (five) minutes as needed for chest pain.   OneTouch Delica Plus [id number] Misc CHECK GLUCOSE UP TO 4 TIMES DAILY AS DIRECTED Dx E11.9   OneTouch Verio test strip Generic drug: glucose blood TEst BS BID and as needed Dx E11.9   Opcon-A 0.027-0.315 % Soln Generic drug: Naphazoline-Pheniramine Place 1 drop into both eyes daily as needed (Burning and itching eyes).   Repatha 140 MG/ML Sosy Generic drug: Evolocumab Inject 140 mg into the skin every 14 (fourteen) days.   rivaroxaban 2.5 MG Tabs tablet Commonly known as: XARELTO Take 1 tablet (2.5 mg total) by mouth 2 (two) times daily.   Tyler Aas FlexTouch 100 UNIT/ML FlexTouch Pen Generic drug: insulin degludec Inject 10 Units into the skin daily.   Vitamin D 50 MCG (2000 UT) tablet Take 4,000 Units by mouth daily.        Objective:   BP (!) 124/57   Pulse 80   Temp (!) 97 F (36.1 C)   Resp 20   Ht 5' (1.524 m)   Wt 113 lb (51.3 kg)   SpO2 99%   BMI 22.07 kg/m   Wt Readings from Last 3 Encounters:  11/01/20 113 lb (51.3 kg)  10/21/20 112 lb 6.4 oz (51 kg)  10/13/20 112 lb 14 oz (51.2 kg)    Physical Exam Vitals and nursing note reviewed.  Constitutional:      Appearance: Normal appearance.  Cardiovascular:     Pulses:          Dorsalis pedis pulses are 1+ on the right side and 1+ on the left side.  Skin:    General: Skin is cool.     Comments: Patient has a smaller ulceration on the pad of her fourth left toe with dark blue discoloration and severe pain and pain even worse upon palpation.  Neurological:     Mental Status: She is alert.       Assessment & Plan:   Problem  List Items Addressed This Visit   None   Visit Diagnoses    Dry gangrene ([place])    -  Primary   Relevant Orders   Ambulatory referral to Orthopedic Surgery   PAD (peripheral artery disease) ([place])       Relevant Orders   Ambulatory referral to Orthopedic Surgery      The toe appears to have significant circulatory issues with an ulceration  on the end of it and severe pain, starting to head towards dry gangrene, will do referral to Dr. Sharol Given. Follow up plan: Return if symptoms worsen or fail to improve.  Counseling provided for all of the vaccine components Orders Placed This Encounter  Procedures  . Ambulatory referral to Hannah Kinda Pottle, MD [place] 11/01/2020, 2:29 PM

## 2020-11-02 ENCOUNTER — Ambulatory Visit: Payer: Medicare Other | Admitting: Orthopedic Surgery

## 2020-11-02 DIAGNOSIS — I96 Gangrene, not elsewhere classified: Secondary | ICD-10-CM

## 2020-11-02 MED ORDER — NITROGLYCERIN 0.2 MG/HR TD PT24: 0.2000 mg | MEDICATED_PATCH | Freq: Every day | TRANSDERMAL | 12 refills | Status: DC

## 2020-11-02 MED ORDER — PENTOXIFYLLINE ER 400 MG PO TBCR: 400.0000 mg | EXTENDED_RELEASE_TABLET | Freq: Three times a day (TID) | ORAL | 3 refills | Status: DC

## 2020-11-02 NOTE — Progress Notes (Signed)
Office Visit Note   Patient: Hannah French           Date of Birth: [birth year]           MRN: [id number] Visit Date: 11/02/2020              Requested by: Dettinger, Fransisca Kaufmann, MD [address] [place],  [place] [address] PCP: Dettinger, Fransisca Kaufmann, MD  Chief Complaint  Patient presents with  . Left Foot - Pain    4th toe ulcer       HPI: Patient is an 80 year old woman who is seen for initial evaluation for the left foot ischemic changes to the 3rd and 4th toes.  She states she has been diabetic for 20 years hemoglobin A1c 8.3.  She recently has undergone an abdominal aortogram on 09/01/2020 ABIs obtained on 07/15/2020.  Patient states she is a smoker.  Assessment & Plan: Visit Diagnoses: No diagnosis found.  Plan: We will start nitroglycerin and Trental.  This may be a thrombotic event.  Reevaluate in 1 week.  Follow-Up Instructions: No follow-ups on file.   Ortho Exam  Patient is alert, oriented, no adenopathy, well-dressed, normal affect, normal respiratory effort. Examination of the left foot she does not have palpable pulses ankle-brachial indices shows biphasic flow.  She has ischemic purple changes to the 3rd and 4th toes her skin is thin and atrophic.  There is no ascending cellulitis.  Imaging: No results found. No images are attached to the encounter.  Labs: Lab Results  Component Value Date   HGBA1C 8.3 (H) 09/09/2020   HGBA1C 7.6 (H) 06/24/2020   HGBA1C 8.6 (H) 02/17/2020   LABURIC 9.0 (H) 10/08/2020     Lab Results  Component Value Date   ALBUMIN 3.7 10/09/2020   ALBUMIN 4.0 06/24/2020   ALBUMIN 3.9 02/17/2020   LABURIC 9.0 (H) 10/08/2020    Lab Results  Component Value Date   MG 1.7 08/13/2018   Lab Results  Component Value Date   VD25OH 103.0 (H) 02/17/2020   VD25OH 54.9 08/08/2019   VD25OH 57.2 01/29/2019    No results found for: PREALBUMIN CBC EXTENDED Latest Ref Rng & Units 10/21/2020 10/13/2020 10/13/2020  WBC 4.0 - 10.5 K/uL  10.1 - -  RBC 3.87 - 5.11 MIL/uL 2.71(L) - -  HGB 12.0 - 15.0 g/dL 9.9(L) 10.5(L) 10.5(L)  HCT 36.0 - 46.0 % 29.4(L) 31.0(L) 31.0(L)  PLT 150 - 400 K/uL 272 - -  NEUTROABS 1.7 - 7.7 K/uL - - -  LYMPHSABS 0.7 - 4.0 K/uL - - -     There is no height or weight on file to calculate BMI.  Orders:  No orders of the defined types were placed in this encounter.  Meds ordered this encounter  Medications  . pentoxifylline (TRENTAL) 400 MG CR tablet    Sig: Take 1 tablet (400 mg total) by mouth 3 (three) times daily with meals.    Dispense:  90 tablet    Refill:  3  . nitroGLYCERIN (NITRODUR - DOSED IN MG/24 HR) 0.2 mg/hr patch    Sig: Place 1 patch (0.2 mg total) onto the skin daily.    Dispense:  30 patch    Refill:  12     Procedures: No procedures performed  Clinical Data: No additional findings.  ROS:  All other systems negative, except as noted in the HPI. Review of Systems  Objective: Vital Signs: There were no vitals taken for this visit.  Specialty Comments:  No specialty comments available.  PMFS History: Patient Active Problem List   Diagnosis Date Noted  . Skin abscess 01/15/2020  . Depression, recurrent ([place]) 08/08/2019  . Cigarette smoker 06/19/2019  . Vitamin D deficiency 01/29/2019  . Dizziness 12/03/2018  . Chronic systolic CHF (congestive heart failure), NYHA class 2 ([place]) 12/03/2018  . Coronary artery disease involving native coronary artery of native heart with angina pectoris ([place]) 10/04/2018  . Pain in right hand 10/02/2018  . Myalgia due to statin 03/29/2018  . COPD GOLD II / active smoker 10/26/2017  . Tobacco abuse 03/07/2016  . Type 2 diabetes mellitus ([place]) 08/26/2015  . Osteoporosis 09/02/2014  . Carotid stenosis 07/22/2012  . PVD 07/02/2009  . BLADDER CANCER 06/05/2009  . Hyperlipidemia associated with type 2 diabetes mellitus ([place]) 06/05/2009  . PSEUDOGOUT 06/05/2009  . MITRAL VALVE PROLAPSE 06/05/2009   Past Medical History:   Diagnosis Date  . Abdominal bruit   . Bladder cancer ([place])   . CAD (coronary artery disease)    a. nonobstructive by cath 08/2018.  . Carotid bruit   . Chronic combined systolic and diastolic CHF (congestive heart failure) ([place])   . CKD (chronic kidney disease), stage II   . Congestive heart failure (CHF) ([place])   . Diabetes mellitus    Type II  . Dyslipidemia   . Heart murmur   . Mild pulmonary hypertension ([place])   . Mitral valve prolapse    a. not seen on most recent echoes. Mild MR now.  Marland Kitchen NICM (nonischemic cardiomyopathy) ([place])   . Osteoporosis   . Pancreatitis, acute   . Pseudogout   . PVD (peripheral vascular disease) ([place])    a. 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  . Sinus tachycardia   . Tobacco abuse     Family History  Problem Relation Age of Onset  . Heart failure Mother   . Hypertension Sister   . Heart attack Brother   . Heart attack Maternal Uncle   . Heart disease Sister   . COPD Brother   . Congestive Heart Failure Brother   . Congestive Heart Failure Daughter   . Rheum arthritis Son   . Ovarian cancer Sister   . Coronary artery disease Neg Hx        Early    Past Surgical History:  Procedure Laterality Date  . ABDOMINAL AORTOGRAM W/LOWER EXTREMITY N/A 12/31/2019   Procedure: ABDOMINAL AORTOGRAM W/LOWER EXTREMITY;  Surgeon: Wellington Hampshire, MD;  Location: [facility];  Service: Cardiovascular;  Laterality: N/A;  . ABDOMINAL AORTOGRAM W/LOWER EXTREMITY N/A 09/01/2020   Procedure: ABDOMINAL AORTOGRAM W/LOWER EXTREMITY;  Surgeon: Wellington Hampshire, MD;  Location: [facility];  Service: Cardiovascular;  Laterality: N/A;  . APPENDECTOMY    . CORONARY STENT INTERVENTION N/A 10/13/2020   Procedure: CORONARY STENT INTERVENTION;  Surgeon: Leonie Man, MD;  Location: [facility];  Service: Cardiovascular;  Laterality: N/A;  . Hysterectomy-type unspecified    . INTRAVASCULAR PRESSURE  WIRE/FFR STUDY N/A 10/13/2020   Procedure: INTRAVASCULAR PRESSURE WIRE/FFR STUDY;  Surgeon: Leonie Man, MD;  Location: [facility];  Service: Cardiovascular;  Laterality: N/A;  . LAPAROSCOPIC CHOLECYSTECTOMY  2009  . PERIPHERAL VASCULAR INTERVENTION  12/31/2019   Procedure: PERIPHERAL VASCULAR INTERVENTION;  Surgeon: Wellington Hampshire, MD;  Location: [facility];  Service: Cardiovascular;;  Bilateral Iliacs  . RIGHT/LEFT HEART CATH AND CORONARY ANGIOGRAPHY N/A 09/16/2018  Procedure: RIGHT/LEFT HEART CATH AND CORONARY ANGIOGRAPHY;  Surgeon: Sherren Mocha, MD;  Location: [facility];  Service: Cardiovascular;  Laterality: N/A;  . RIGHT/LEFT HEART CATH AND CORONARY ANGIOGRAPHY N/A 10/13/2020   Procedure: RIGHT/LEFT HEART CATH AND CORONARY ANGIOGRAPHY;  Surgeon: Jolaine Artist, MD;  Location: [facility];  Service: Cardiovascular;  Laterality: N/A;   Social History   Occupational History  . Occupation: Retired  Tobacco Use  . Smoking status: Current Every Day Smoker    Packs/day: 1.00    Years: 50.00    Pack years: 50.00    Types: Cigarettes  . Smokeless tobacco: Never Used  Vaping Use  . Vaping Use: Never used  Substance and Sexual Activity  . Alcohol use: No  . Drug use: No  . Sexual activity: Not Currently

## 2020-11-08 ENCOUNTER — Telehealth: Payer: Self-pay | Admitting: Orthopedic Surgery

## 2020-11-08 NOTE — Telephone Encounter (Signed)
Pt sister called and said pt is in a lot of pain wondering if she can get something until tomorrow when she will be seen or if she can take tramadol that her aunt has been perscribed. Please CB [phone]

## 2020-11-08 NOTE — Telephone Encounter (Signed)
Please see message below. Pt was in the office last week but dictation is not available pt has an appt tomorrow.

## 2020-11-09 ENCOUNTER — Encounter: Payer: Self-pay | Admitting: Orthopedic Surgery

## 2020-11-09 ENCOUNTER — Encounter: Payer: Self-pay | Admitting: Physician Assistant

## 2020-11-09 ENCOUNTER — Ambulatory Visit: Payer: Medicare Other | Admitting: Orthopedic Surgery

## 2020-11-09 DIAGNOSIS — I96 Gangrene, not elsewhere classified: Secondary | ICD-10-CM | POA: Diagnosis not present

## 2020-11-09 MED ORDER — TRAMADOL HCL 50 MG PO TABS: 50.0000 mg | ORAL_TABLET | Freq: Four times a day (QID) | ORAL | 0 refills | Status: DC | PRN

## 2020-11-09 NOTE — Progress Notes (Signed)
Office Visit Note   Patient: Hannah French           Date of Birth: 07/11/1941           MRN: [id number] Visit Date: 11/09/2020              Requested by: Dettinger, Fransisca Kaufmann, MD [address] [place],  [place] [address] PCP: Dettinger, Fransisca Kaufmann, MD  Chief Complaint  Patient presents with  . Left 4th Toe - Follow-up      HPI: Patient is an 80 year old woman status post revascularization with ischemic changes to the third and fourth toes she was started on nitroglycerin and Trental she states that her foot is looking better but she still has ischemic pain.  Patient states she also has a history of pseudogout.  Assessment & Plan: Visit Diagnoses: No diagnosis found.  Plan: Patient will continue with the nitroglycerin and Trental.  Recommended exercise and muscle activity to improve circulation.  Prescription provided for Ultram patient states she is very sensitive to narcotic pain medicine.  Follow-Up Instructions: Return in about 1 week (around 11/16/2020).   Ortho Exam  Patient is alert, oriented, no adenopathy, well-dressed, normal affect, normal respiratory effort. Examination patient has capillary refill about 3 seconds in her toes the ischemic changes to the third and fourth toe was significantly better she now only has some mild ischemic changes in the plantar aspect of the tip of the third and fourth toes.  Doppler was used she has a monophasic posterior tibial pulse a biphasic anterior tibial pulse I cannot Doppler a dorsalis pedis pulse.  Patient does complain of ischemic pain.  Imaging: No results found. No images are attached to the encounter.  Labs: Lab Results  Component Value Date   HGBA1C 8.3 (H) 09/09/2020   HGBA1C 7.6 (H) 06/24/2020   HGBA1C 8.6 (H) 02/17/2020   LABURIC 9.0 (H) 10/08/2020     Lab Results  Component Value Date   ALBUMIN 3.7 10/09/2020   ALBUMIN 4.0 06/24/2020   ALBUMIN 3.9 02/17/2020   LABURIC 9.0 (H) 10/08/2020    Lab  Results  Component Value Date   MG 1.7 08/13/2018   Lab Results  Component Value Date   VD25OH 103.0 (H) 02/17/2020   VD25OH 54.9 08/08/2019   VD25OH 57.2 01/29/2019    No results found for: PREALBUMIN CBC EXTENDED Latest Ref Rng & Units 10/21/2020 10/13/2020 10/13/2020  WBC 4.0 - 10.5 K/uL 10.1 - -  RBC 3.87 - 5.11 MIL/uL 2.71(L) - -  HGB 12.0 - 15.0 g/dL 9.9(L) 10.5(L) 10.5(L)  HCT 36.0 - 46.0 % 29.4(L) 31.0(L) 31.0(L)  PLT 150 - 400 K/uL 272 - -  NEUTROABS 1.7 - 7.7 K/uL - - -  LYMPHSABS 0.7 - 4.0 K/uL - - -     There is no height or weight on file to calculate BMI.  Orders:  No orders of the defined types were placed in this encounter.  Meds ordered this encounter  Medications  . traMADol (ULTRAM) 50 MG tablet    Sig: Take 1 tablet (50 mg total) by mouth every 6 (six) hours as needed for moderate pain.    Dispense:  40 tablet    Refill:  0  . traMADol (ULTRAM) 50 MG tablet    Sig: Take 1 tablet (50 mg total) by mouth every 6 (six) hours as needed for moderate pain.    Dispense:  40 tablet    Refill:  0  Procedures: No procedures performed  Clinical Data: No additional findings.  ROS:  All other systems negative, except as noted in the HPI. Review of Systems  Objective: Vital Signs: There were no vitals taken for this visit.  Specialty Comments:  No specialty comments available.  PMFS History: Patient Active Problem List   Diagnosis Date Noted  . Skin abscess 01/15/2020  . Depression, recurrent ([place]) 08/08/2019  . Cigarette smoker 06/19/2019  . Vitamin D deficiency 01/29/2019  . Dizziness 12/03/2018  . Chronic systolic CHF (congestive heart failure), NYHA class 2 ([place]) 12/03/2018  . Coronary artery disease involving native coronary artery of native heart with angina pectoris ([place]) 10/04/2018  . Pain in right hand 10/02/2018  . Myalgia due to statin 03/29/2018  . COPD GOLD II / active smoker 10/26/2017  . Tobacco abuse 03/07/2016  . Type 2  diabetes mellitus ([place]) 08/26/2015  . Osteoporosis 09/02/2014  . Carotid stenosis 07/22/2012  . PVD 07/02/2009  . BLADDER CANCER 06/05/2009  . Hyperlipidemia associated with type 2 diabetes mellitus ([place]) 06/05/2009  . PSEUDOGOUT 06/05/2009  . MITRAL VALVE PROLAPSE 06/05/2009   Past Medical History:  Diagnosis Date  . Abdominal bruit   . Bladder cancer ([place])   . CAD (coronary artery disease)    a. nonobstructive by cath 08/2018.  . Carotid bruit   . Chronic combined systolic and diastolic CHF (congestive heart failure) ([place])   . CKD (chronic kidney disease), stage II   . Congestive heart failure (CHF) ([place])   . Diabetes mellitus    Type II  . Dyslipidemia   . Heart murmur   . Mild pulmonary hypertension ([place])   . Mitral valve prolapse    a. not seen on most recent echoes. Mild MR now.  Marland Kitchen NICM (nonischemic cardiomyopathy) ([place])   . Osteoporosis   . Pancreatitis, acute   . Pseudogout   . PVD (peripheral vascular disease) ([place])    a. 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  . Sinus tachycardia   . Tobacco abuse     Family History  Problem Relation Age of Onset  . Heart failure Mother   . Hypertension Sister   . Heart attack Brother   . Heart attack Maternal Uncle   . Heart disease Sister   . COPD Brother   . Congestive Heart Failure Brother   . Congestive Heart Failure Daughter   . Rheum arthritis Son   . Ovarian cancer Sister   . Coronary artery disease Neg Hx        Early    Past Surgical History:  Procedure Laterality Date  . ABDOMINAL AORTOGRAM W/LOWER EXTREMITY N/A 12/31/2019   Procedure: ABDOMINAL AORTOGRAM W/LOWER EXTREMITY;  Surgeon: Wellington Hampshire, MD;  Location: [facility];  Service: Cardiovascular;  Laterality: N/A;  . ABDOMINAL AORTOGRAM W/LOWER EXTREMITY N/A 09/01/2020   Procedure: ABDOMINAL AORTOGRAM W/LOWER EXTREMITY;  Surgeon: Wellington Hampshire, MD;  Location: [facility];  Service:  Cardiovascular;  Laterality: N/A;  . APPENDECTOMY    . CORONARY STENT INTERVENTION N/A 10/13/2020   Procedure: CORONARY STENT INTERVENTION;  Surgeon: Leonie Man, MD;  Location: [facility];  Service: Cardiovascular;  Laterality: N/A;  . Hysterectomy-type unspecified    . INTRAVASCULAR PRESSURE WIRE/FFR STUDY N/A 10/13/2020   Procedure: INTRAVASCULAR PRESSURE WIRE/FFR STUDY;  Surgeon: Leonie Man, MD;  Location: [facility];  Service: Cardiovascular;  Laterality: N/A;  . LAPAROSCOPIC CHOLECYSTECTOMY  2009  . PERIPHERAL  VASCULAR INTERVENTION  12/31/2019   Procedure: PERIPHERAL VASCULAR INTERVENTION;  Surgeon: Wellington Hampshire, MD;  Location: [facility];  Service: Cardiovascular;;  Bilateral Iliacs  . RIGHT/LEFT HEART CATH AND CORONARY ANGIOGRAPHY N/A 09/16/2018   Procedure: RIGHT/LEFT HEART CATH AND CORONARY ANGIOGRAPHY;  Surgeon: Sherren Mocha, MD;  Location: [facility];  Service: Cardiovascular;  Laterality: N/A;  . RIGHT/LEFT HEART CATH AND CORONARY ANGIOGRAPHY N/A 10/13/2020   Procedure: RIGHT/LEFT HEART CATH AND CORONARY ANGIOGRAPHY;  Surgeon: Jolaine Artist, MD;  Location: [facility];  Service: Cardiovascular;  Laterality: N/A;   Social History   Occupational History  . Occupation: Retired  Tobacco Use  . Smoking status: Current Every Day Smoker    Packs/day: 1.00    Years: 50.00    Pack years: 50.00    Types: Cigarettes  . Smokeless tobacco: Never Used  Vaping Use  . Vaping Use: Never used  Substance and Sexual Activity  . Alcohol use: No  . Drug use: No  . Sexual activity: Not Currently

## 2020-11-15 ENCOUNTER — Encounter (HOSPITAL_COMMUNITY): Payer: Self-pay

## 2020-11-15 ENCOUNTER — Encounter (HOSPITAL_COMMUNITY): Payer: Medicare Other | Admitting: Internal Medicine

## 2020-11-15 NOTE — Telephone Encounter (Signed)
Stop ASA. Continue other 2.

## 2020-11-16 ENCOUNTER — Telehealth: Payer: Self-pay | Admitting: Cardiovascular Disease

## 2020-11-16 ENCOUNTER — Encounter: Payer: Self-pay | Admitting: Orthopedic Surgery

## 2020-11-16 ENCOUNTER — Ambulatory Visit: Payer: Medicare Other | Admitting: Physician Assistant

## 2020-11-16 DIAGNOSIS — I96 Gangrene, not elsewhere classified: Secondary | ICD-10-CM | POA: Diagnosis not present

## 2020-11-16 NOTE — Telephone Encounter (Signed)
Pt c/o medication issue:  1. Name of Medication: rivaroxaban (XARELTO) 2.5 MG TABS tablet clopidogrel (PLAVIX) 75 MG tablet  2. How are you currently taking this medication (dosage and times per day)? xarelto 2.5 mg twice daily, plavix 75 mg daily   3. Are you having a reaction (difficulty breathing--STAT)? no  4. What is your medication issue? Patient states she is on two blood thinners. Patient wanted to know if it was necessary for her to take both medications. Please advise

## 2020-11-16 NOTE — Telephone Encounter (Signed)
Returned call to patient to discuss-she is very concerned about being on "2 blood thinners".   Discussed xarelto and plavix and the purpose of both.   She states Hannah French put her on Xarelto to see if this would help her toes but this did not.  She states she is now seeing Hannah French and her toes are improving.   She is wondering if she needs to continue Xarelto.  No issues with bleeding or falls, she states she is very careful.  Per chart review:   R and L heart cath 1/19- PCI to mid LAD "Recommend to resume Rivaroxaban, at currently prescribed dose and frequency on 10/13/2020. Recommend concurrent antiplatelet therapy of Aspirin 81 mg for 1 month and Clopidogrel 75mg  daily for 12 months . If there are significant bleeding complications, could make clopidogrel interrupted after 6 months."  She stopped the ASA on 2/19 per Hannah French.   Patient request to confirm with Hannah French she needs to continue Xarelto.

## 2020-11-16 NOTE — Progress Notes (Signed)
Office Visit Note   Patient: Hannah French           Date of Birth: [birth year]           MRN: [id number] Visit Date: 11/16/2020              Requested by: Dettinger, Fransisca Kaufmann, MD [address] [place],  [place] [address] PCP: Dettinger, Fransisca Kaufmann, MD  Chief Complaint  Patient presents with  . Left Foot - Follow-up    3rd and 4th toe       HPI: Patient is a pleasant 80 year old woman who comes in for follow-up today she is status post left lower extremity revascularization procedure.  We have been following her for her third and fourth toes which were necrotic.  She feels her pain has improved.  She has difficulty tolerating the Trental 3 times a day because it caused her nausea.  She is wearing bedroom slippers today.  Assessment & Plan: Visit Diagnoses: No diagnosis found.  Plan: Toes appear stable.  Do not see any signs of infection.  I think at this point she could follow-up in 2 weeks.  Have spoken with her family if they are concerned about any changes she may contact us immediately  Follow-Up Instructions: No follow-ups on file.   Ortho Exam  Patient is alert, oriented, no adenopathy, well-dressed, normal affect, normal respiratory effort. Left foot demonstrates no swelling no cellulitis.  She does have ischemic changes to the very tip of the of the third and fourth toes on the plantar surface.  No skin breakdown.  No tenderness to palpation.  No sausage toes.  No signs of infection today toes are stable  Imaging: No results found. No images are attached to the encounter.  Labs: Lab Results  Component Value Date   HGBA1C 8.3 (H) 09/09/2020   HGBA1C 7.6 (H) 06/24/2020   HGBA1C 8.6 (H) 02/17/2020   LABURIC 9.0 (H) 10/08/2020     Lab Results  Component Value Date   ALBUMIN 3.7 10/09/2020   ALBUMIN 4.0 06/24/2020   ALBUMIN 3.9 02/17/2020   LABURIC 9.0 (H) 10/08/2020    Lab Results  Component Value Date   MG 1.7 08/13/2018   Lab Results  Component  Value Date   VD25OH 103.0 (H) 02/17/2020   VD25OH 54.9 08/08/2019   VD25OH 57.2 01/29/2019    No results found for: PREALBUMIN CBC EXTENDED Latest Ref Rng & Units 10/21/2020 10/13/2020 10/13/2020  WBC 4.0 - 10.5 K/uL 10.1 - -  RBC 3.87 - 5.11 MIL/uL 2.71(L) - -  HGB 12.0 - 15.0 g/dL 9.9(L) 10.5(L) 10.5(L)  HCT 36.0 - 46.0 % 29.4(L) 31.0(L) 31.0(L)  PLT 150 - 400 K/uL 272 - -  NEUTROABS 1.7 - 7.7 K/uL - - -  LYMPHSABS 0.7 - 4.0 K/uL - - -     There is no height or weight on file to calculate BMI.  Orders:  No orders of the defined types were placed in this encounter.  No orders of the defined types were placed in this encounter.    Procedures: No procedures performed  Clinical Data: No additional findings.  ROS:  All other systems negative, except as noted in the HPI. Review of Systems  Objective: Vital Signs: There were no vitals taken for this visit.  Specialty Comments:  No specialty comments available.  PMFS History: Patient Active Problem List   Diagnosis Date Noted  . Skin abscess 01/15/2020  . Depression, recurrent ([place]) 08/08/2019  .  Cigarette smoker 06/19/2019  . Vitamin D deficiency 01/29/2019  . Dizziness 12/03/2018  . Chronic systolic CHF (congestive heart failure), NYHA class 2 ([place]) 12/03/2018  . Coronary artery disease involving native coronary artery of native heart with angina pectoris ([place]) 10/04/2018  . Pain in right hand 10/02/2018  . Myalgia due to statin 03/29/2018  . COPD GOLD II / active smoker 10/26/2017  . Tobacco abuse 03/07/2016  . Type 2 diabetes mellitus ([place]) 08/26/2015  . Osteoporosis 09/02/2014  . Carotid stenosis 07/22/2012  . PVD 07/02/2009  . BLADDER CANCER 06/05/2009  . Hyperlipidemia associated with type 2 diabetes mellitus ([place]) 06/05/2009  . PSEUDOGOUT 06/05/2009  . MITRAL VALVE PROLAPSE 06/05/2009   Past Medical History:  Diagnosis Date  . Abdominal bruit   . Bladder cancer ([place])   . CAD (coronary artery  disease)    a. nonobstructive by cath 08/2018.  . Carotid bruit   . Chronic combined systolic and diastolic CHF (congestive heart failure) ([place])   . CKD (chronic kidney disease), stage II   . Congestive heart failure (CHF) ([place])   . Diabetes mellitus    Type II  . Dyslipidemia   . Heart murmur   . Mild pulmonary hypertension ([place])   . Mitral valve prolapse    a. not seen on most recent echoes. Mild MR now.  Marland Kitchen NICM (nonischemic cardiomyopathy) ([place])   . Osteoporosis   . Pancreatitis, acute   . Pseudogout   . PVD (peripheral vascular disease) ([place])    a. 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  . Sinus tachycardia   . Tobacco abuse     Family History  Problem Relation Age of Onset  . Heart failure Mother   . Hypertension Sister   . Heart attack Brother   . Heart attack Maternal Uncle   . Heart disease Sister   . COPD Brother   . Congestive Heart Failure Brother   . Congestive Heart Failure Daughter   . Rheum arthritis Son   . Ovarian cancer Sister   . Coronary artery disease Neg Hx        Early    Past Surgical History:  Procedure Laterality Date  . ABDOMINAL AORTOGRAM W/LOWER EXTREMITY N/A 12/31/2019   Procedure: ABDOMINAL AORTOGRAM W/LOWER EXTREMITY;  Surgeon: Wellington Hampshire, MD;  Location: [facility];  Service: Cardiovascular;  Laterality: N/A;  . ABDOMINAL AORTOGRAM W/LOWER EXTREMITY N/A 09/01/2020   Procedure: ABDOMINAL AORTOGRAM W/LOWER EXTREMITY;  Surgeon: Wellington Hampshire, MD;  Location: [facility];  Service: Cardiovascular;  Laterality: N/A;  . APPENDECTOMY    . CORONARY STENT INTERVENTION N/A 10/13/2020   Procedure: CORONARY STENT INTERVENTION;  Surgeon: Leonie Man, MD;  Location: [facility];  Service: Cardiovascular;  Laterality: N/A;  . Hysterectomy-type unspecified    . INTRAVASCULAR PRESSURE WIRE/FFR STUDY N/A 10/13/2020   Procedure: INTRAVASCULAR PRESSURE WIRE/FFR STUDY;  Surgeon:  Leonie Man, MD;  Location: [facility];  Service: Cardiovascular;  Laterality: N/A;  . LAPAROSCOPIC CHOLECYSTECTOMY  2009  . PERIPHERAL VASCULAR INTERVENTION  12/31/2019   Procedure: PERIPHERAL VASCULAR INTERVENTION;  Surgeon: Wellington Hampshire, MD;  Location: [facility];  Service: Cardiovascular;;  Bilateral Iliacs  . RIGHT/LEFT HEART CATH AND CORONARY ANGIOGRAPHY N/A 09/16/2018   Procedure: RIGHT/LEFT HEART CATH AND CORONARY ANGIOGRAPHY;  Surgeon: Sherren Mocha, MD;  Location: [facility];  Service: Cardiovascular;  Laterality: N/A;  . RIGHT/LEFT HEART CATH AND CORONARY  ANGIOGRAPHY N/A 10/13/2020   Procedure: RIGHT/LEFT HEART CATH AND CORONARY ANGIOGRAPHY;  Surgeon: Jolaine Artist, MD;  Location: [facility];  Service: Cardiovascular;  Laterality: N/A;   Social History   Occupational History  . Occupation: Retired  Tobacco Use  . Smoking status: Current Every Day Smoker    Packs/day: 1.00    Years: 50.00    Pack years: 50.00    Types: Cigarettes  . Smokeless tobacco: Never Used  Vaping Use  . Vaping Use: Never used  Substance and Sexual Activity  . Alcohol use: No  . Drug use: No  . Sexual activity: Not Currently

## 2020-11-17 NOTE — Telephone Encounter (Signed)
The dose of Xarelto is small and if not full dose that we use in atrial fibrillation.  It helps prevent future clot formation in the small vessels in her foot.  It can be used with Plavix.  I suggest that she stays on it for now and monitor for any signs of bleeding.

## 2020-11-17 NOTE — Telephone Encounter (Signed)
Spoke to patient-aware and verbalized understanding.

## 2020-11-30 ENCOUNTER — Ambulatory Visit: Payer: Medicare Other | Admitting: Orthopedic Surgery

## 2020-11-30 DIAGNOSIS — I96 Gangrene, not elsewhere classified: Secondary | ICD-10-CM

## 2020-12-09 ENCOUNTER — Encounter: Payer: Self-pay | Admitting: Family Medicine

## 2020-12-09 ENCOUNTER — Ambulatory Visit (INDEPENDENT_AMBULATORY_CARE_PROVIDER_SITE_OTHER): Payer: Medicare Other | Admitting: Family Medicine

## 2020-12-09 ENCOUNTER — Other Ambulatory Visit: Payer: Self-pay

## 2020-12-09 VITALS — BP 111/65 | HR 97 | Ht 60.0 in | Wt 113.1 lb

## 2020-12-09 DIAGNOSIS — J449 Chronic obstructive pulmonary disease, unspecified: Secondary | ICD-10-CM | POA: Diagnosis not present

## 2020-12-09 DIAGNOSIS — E785 Hyperlipidemia, unspecified: Secondary | ICD-10-CM

## 2020-12-09 DIAGNOSIS — E1169 Type 2 diabetes mellitus with other specified complication: Secondary | ICD-10-CM | POA: Diagnosis not present

## 2020-12-09 DIAGNOSIS — F339 Major depressive disorder, recurrent, unspecified: Secondary | ICD-10-CM | POA: Diagnosis not present

## 2020-12-09 LAB — BAYER DCA HB A1C WAIVED: HB A1C (BAYER DCA - WAIVED): 8.1 % — ABNORMAL HIGH (ref <7.0)

## 2020-12-09 MED ORDER — DULOXETINE HCL 30 MG PO CPEP: 30.0000 mg | ORAL_CAPSULE | Freq: Every day | ORAL | 3 refills | Status: DC

## 2020-12-09 MED ORDER — METHYLPREDNISOLONE ACETATE 80 MG/ML IJ SUSP: 80.0000 mg | Freq: Once | INTRAMUSCULAR | Status: DC

## 2020-12-09 NOTE — Progress Notes (Signed)
BP 111/65   Pulse 97   Ht 5' (1.524 m)   Wt 113 lb 2 oz (51.3 kg)   SpO2 97%   BMI 22.09 kg/m    Subjective:   Patient ID: Hannah French, female    DOB: [birth year], 80 y.o.   MRN: [id number]  HPI: Hannah French is a 80 y.o. female presenting on 12/09/2020 for Medical Management of Chronic Issues, Hypertension, and COPD   HPI COPD Patient is coming in for COPD recheck today.  He is currently on albuterol and Advair, she is having a little more congestion recently over the past week especially with the allergies and pollen picking up.  She is having a little more wheezing as well in the little more coughing.  She denies any fevers or chills..  He has a mild chronic cough but denies any major coughing spells or wheezing spells.  He has 1nighttime symptoms per week and 4daytime symptoms per week currently.   Type 2 diabetes mellitus Patient comes in today for recheck of his diabetes. Patient has been currently taking [place] and glimepiride. Patient is not currently on an ACE inhibitor/ARB. Patient has not seen an ophthalmologist this year. Patient has a lot of circulatory issues with her feet and her toes on her left foot are very painful and purple and she is seeing a specialist for this Dr. Sharol Given. The symptom started onset as an adult hyperlipidemia and COPD and CAD and PAD ARE RELATED TO DM, she just recently had a stent placed in her heart for CAD is on Plavix because of the  Hyperlipidemia Patient is coming in for recheck of his hyperlipidemia. The patient is currently taking Repatha. They deny any issues with myalgias or history of liver damage from it. They deny any focal numbness or weakness or chest pain.   Patient also has A. fib and is on Xarelto, sees cardiology  GERD Patient is currently on no medication currently denies any issues today.  She denies any major symptoms or abdominal pain or belching or burping. She denies any blood in her stool or  lightheadedness or dizziness.  Patient has anxiety and depression and takes Cymbalta and also helps with pain.  She does say it is helping and she has been feeling okay with that.  Relevant past medical, surgical, family and social history reviewed and updated as indicated. Interim medical history since our last visit reviewed. Allergies and medications reviewed and updated.  Review of Systems  Constitutional: Negative for chills and fever.  HENT: Positive for congestion.   Eyes: Negative for visual disturbance.  Respiratory: Positive for cough and wheezing. Negative for chest tightness and shortness of breath.   Cardiovascular: Negative for chest pain and leg swelling.  Genitourinary: Negative for difficulty urinating and dysuria.  Musculoskeletal: Negative for back pain and gait problem.  Skin: Positive for color change. Negative for rash and wound.  Neurological: Positive for numbness. Negative for light-headedness and headaches.  Psychiatric/Behavioral: Negative for agitation and behavioral problems.  All other systems reviewed and are negative.   Per HPI unless specifically indicated above   Allergies as of 12/09/2020      Reactions   Contrast Media [iodinated Diagnostic Agents] Shortness Of Breath   Iohexol Other (See Comments)   PASSED OUT DURING THE TEST   Metformin Swelling   Face and hands became swollen   Statins Swelling, Other (See Comments)   They make the patient hurt "all over" Myalgia, also  Bextra [valdecoxib] Nausea And Vomiting, Swelling   Shut down my kidneys    Cefdinir Other (See Comments)   Pancreatitis   Cozaar [losartan Potassium] Other (See Comments)   dizziness   Gabapentin    Nsaids Other (See Comments)   GI intolerance and pain all over, tolerates ibu   Zetia [ezetimibe] Swelling   Rofecoxib Other (See Comments)   "VIOXX"- "SHUT DOWN MY KIDNEYS"   Sulfa Antibiotics Nausea And Vomiting, Other (See Comments)   Pancreatitis      Medication  List       Accurate as of December 09, 2020 11:31 AM. If you have any questions, ask your nurse or doctor.        STOP taking these medications   aspirin EC 81 MG tablet Stopped by: Hannah Kaufmann Dettinger, MD     TAKE these medications   acetaminophen 500 MG tablet Commonly known as: TYLENOL Take 500 mg by mouth every 6 (six) hours as needed for moderate pain.   albuterol 108 (90 Base) MCG/ACT inhaler Commonly known as: VENTOLIN HFA Inhale 2 puffs into the lungs every 6 (six) hours as needed for wheezing or shortness of breath.   B-D UF III MINI PEN NEEDLES 31G X 5 MM Misc Generic drug: Insulin Pen Needle 1 each by Does not apply route 4 (four) times daily.   blood glucose meter kit and supplies Use up to four times daily as directed   clopidogrel 75 MG tablet Commonly known as: Plavix Take 1 tablet (75 mg total) by mouth daily.   cyclobenzaprine 10 MG tablet Commonly known as: FLEXERIL TAKE 1 TABLET THREE TIMES DAILY FOR MUSCLE SPASM   DULoxetine 30 MG capsule Commonly known as: Cymbalta Take 1 capsule (30 mg total) by mouth daily.   empagliflozin 10 MG Tabs tablet Commonly known as: JARDIANCE Take 1 tablet (10 mg total) by mouth daily before breakfast.   Fluticasone-Salmeterol 100-50 MCG/DOSE Aepb Commonly known as: ADVAIR Inhale 1 puff into the lungs 2 (two) times daily.   furosemide 20 MG tablet Commonly known as: LASIX Take 2 tablets daily. You may take an extra dose for increase weight gain orswelling   glimepiride 2 MG tablet Commonly known as: AMARYL Take 1-2 tablets (2-4 mg total) by mouth daily with breakfast.   metoprolol succinate 25 MG 24 hr tablet Commonly known as: TOPROL-XL Take 1 tablet (25 mg total) by mouth daily.   nitroGLYCERIN 0.3 MG SL tablet Commonly known as: Nitrostat Place 1 tablet (0.3 mg total) under the tongue every 5 (five) minutes as needed for chest pain.   nitroGLYCERIN 0.2 mg/hr patch Commonly known as: NITRODUR - Dosed in  mg/24 hr Place 1 patch (0.2 mg total) onto the skin daily.   OneTouch Delica Plus [id number] Misc CHECK GLUCOSE UP TO 4 TIMES DAILY AS DIRECTED Dx E11.9   OneTouch Verio test strip Generic drug: glucose blood TEst BS BID and as needed Dx E11.9   Opcon-A 0.027-0.315 % Soln Generic drug: Naphazoline-Pheniramine Place 1 drop into both eyes daily as needed (Burning and itching eyes).   pentoxifylline 400 MG CR tablet Commonly known as: TRENTAL Take 1 tablet (400 mg total) by mouth 3 (three) times daily with meals.   Repatha 140 MG/ML Sosy Generic drug: Evolocumab Inject 140 mg into the skin every 14 (fourteen) days.   rivaroxaban 2.5 MG Tabs tablet Commonly known as: XARELTO Take 1 tablet (2.5 mg total) by mouth 2 (two) times daily.   traMADol 50 MG tablet  Commonly known as: ULTRAM Take 1 tablet (50 mg total) by mouth every 6 (six) hours as needed for moderate pain. What changed: Another medication with the same name was removed. Continue taking this medication, and follow the directions you see here. Changed by: Hannah Kaufmann Dettinger, MD   Tyler Aas FlexTouch 100 UNIT/ML FlexTouch Pen Generic drug: insulin degludec Inject 10 Units into the skin daily.   Vitamin D 50 MCG (2000 UT) tablet Take 4,000 Units by mouth daily.        Objective:   BP 111/65   Pulse 97   Ht 5' (1.524 m)   Wt 113 lb 2 oz (51.3 kg)   SpO2 97%   BMI 22.09 kg/m   Wt Readings from Last 3 Encounters:  12/09/20 113 lb 2 oz (51.3 kg)  11/01/20 113 lb (51.3 kg)  10/21/20 112 lb 6.4 oz (51 kg)    Physical Exam Vitals and nursing note reviewed.  Constitutional:      General: She is not in acute distress.    Appearance: She is well-developed. She is not diaphoretic.  Eyes:     Conjunctiva/sclera: Conjunctivae normal.  Cardiovascular:     Rate and Rhythm: Normal rate and regular rhythm.     Heart sounds: Murmur (Systolic murmur best heard of the left second intercostal space) heard.    Pulmonary:      Effort: Pulmonary effort is normal. No respiratory distress.     Breath sounds: Wheezing and rhonchi present. No rales.  Chest:     Chest wall: No tenderness.  Skin:    General: Skin is warm and dry.     Findings: No rash.     Comments: Cold fourth toe that is purple on her left foot.  Neurological:     Mental Status: She is alert and oriented to person, place, and time.     Coordination: Coordination normal.  Psychiatric:        Behavior: Behavior normal.       Assessment & Plan:   Problem List Items Addressed This Visit      Respiratory   COPD GOLD II / active smoker   Relevant Medications   methylPREDNISolone acetate (DEPO-MEDROL) injection 80 mg     Endocrine   Hyperlipidemia associated with type 2 diabetes mellitus (HCC)   Type 2 diabetes mellitus ([place]) - Primary   Relevant Orders   Bayer DCA Hb A1c Waived   Microalbumin / creatinine urine ratio     Other   Depression, recurrent (HCC)   Relevant Medications   DULoxetine (CYMBALTA) 30 MG capsule    Gave steroid injection to help with her lungs to help with COPD exacerbation.  No change in diabetes medications for now because of frequent steroids because of recent surgeries, will monitor in the future.  Refocus on diet.  A1c was 8.1.  Follow up plan: Return in about 3 months (around 03/11/2021), or if symptoms worsen or fail to improve, for Diabetes and hyperlipidemia and COPD recheck.  Counseling provided for all of the vaccine components Orders Placed This Encounter  Procedures  . Bayer DCA Hb A1c Waived  . Microalbumin / creatinine urine ratio    Caryl Pina, MD [facility] 12/09/2020, 11:31 AM

## 2020-12-14 ENCOUNTER — Ambulatory Visit: Payer: Medicare Other | Admitting: Cardiovascular Disease

## 2020-12-14 ENCOUNTER — Ambulatory Visit: Payer: Medicare Other | Admitting: Orthopedic Surgery

## 2020-12-14 ENCOUNTER — Encounter: Payer: Self-pay | Admitting: Orthopedic Surgery

## 2020-12-14 DIAGNOSIS — I96 Gangrene, not elsewhere classified: Secondary | ICD-10-CM

## 2020-12-14 NOTE — Progress Notes (Signed)
Office Visit Note   Patient: Hannah French           Date of Birth: May 21, 1941           MRN: [id number] Visit Date: 12/14/2020              Requested by: Dettinger, Fransisca Kaufmann, MD [address] [place],  [place] [address] PCP: Dettinger, Fransisca Kaufmann, MD  Chief Complaint  Patient presents with  . Left Foot - Follow-up      HPI: Patient is a 80 year old woman who is seen in follow-up status post revascularization to left lower extremity with gangrenous changes to her toes she states her foot is improving but is still painful beneath the fourth toe.  Patient states she is still smoking.  Assessment & Plan: Visit Diagnoses:  1. Gangrene of toe of left foot ([place])     Plan: Again recommended smoking cessation continue the nitroglycerin patch continue with Trent tall.  Follow-Up Instructions: Return in about 3 weeks (around 01/04/2021).   Ortho Exam  Patient is alert, oriented, no adenopathy, well-dressed, normal affect, normal respiratory effort. Examination all ischemic ulcers have resolved she has good epithelialization except beneath the fourth toe she has a 5 mm in diameter 0.1 mm deep eschar which appears superficial there is no swelling no cellulitis no drainage no signs of infection.  Imaging: No results found. No images are attached to the encounter.  Labs: Lab Results  Component Value Date   HGBA1C 8.1 (H) 12/09/2020   HGBA1C 8.3 (H) 09/09/2020   HGBA1C 7.6 (H) 06/24/2020   LABURIC 9.0 (H) 10/08/2020     Lab Results  Component Value Date   ALBUMIN 3.7 10/09/2020   ALBUMIN 4.0 06/24/2020   ALBUMIN 3.9 02/17/2020    Lab Results  Component Value Date   MG 1.7 08/13/2018   Lab Results  Component Value Date   VD25OH 103.0 (H) 02/17/2020   VD25OH 54.9 08/08/2019   VD25OH 57.2 01/29/2019    No results found for: PREALBUMIN CBC EXTENDED Latest Ref Rng & Units 10/21/2020 10/13/2020 10/13/2020  WBC 4.0 - 10.5 K/uL 10.1 - -  RBC 3.87 - 5.11 MIL/uL 2.71(L) -  -  HGB 12.0 - 15.0 g/dL 9.9(L) 10.5(L) 10.5(L)  HCT 36.0 - 46.0 % 29.4(L) 31.0(L) 31.0(L)  PLT 150 - 400 K/uL 272 - -  NEUTROABS 1.7 - 7.7 K/uL - - -  LYMPHSABS 0.7 - 4.0 K/uL - - -     There is no height or weight on file to calculate BMI.  Orders:  No orders of the defined types were placed in this encounter.  No orders of the defined types were placed in this encounter.    Procedures: No procedures performed  Clinical Data: No additional findings.  ROS:  All other systems negative, except as noted in the HPI. Review of Systems  Objective: Vital Signs: There were no vitals taken for this visit.  Specialty Comments:  No specialty comments available.  PMFS History: Patient Active Problem List   Diagnosis Date Noted  . Skin abscess 01/15/2020  . Depression, recurrent ([place]) 08/08/2019  . Cigarette smoker 06/19/2019  . Vitamin D deficiency 01/29/2019  . Dizziness 12/03/2018  . Chronic systolic CHF (congestive heart failure), NYHA class 2 ([place]) 12/03/2018  . Coronary artery disease involving native coronary artery of native heart with angina pectoris ([place]) 10/04/2018  . Pain in right hand 10/02/2018  . Myalgia due to statin 03/29/2018  . COPD GOLD  II / active smoker 10/26/2017  . Tobacco abuse 03/07/2016  . Type 2 diabetes mellitus ([place]) 08/26/2015  . Osteoporosis 09/02/2014  . Carotid stenosis 07/22/2012  . PVD 07/02/2009  . BLADDER CANCER 06/05/2009  . Hyperlipidemia associated with type 2 diabetes mellitus ([place]) 06/05/2009  . PSEUDOGOUT 06/05/2009  . MITRAL VALVE PROLAPSE 06/05/2009   Past Medical History:  Diagnosis Date  . Abdominal bruit   . Bladder cancer ([place])   . CAD (coronary artery disease)    a. nonobstructive by cath 08/2018.  . Carotid bruit   . Chronic combined systolic and diastolic CHF (congestive heart failure) ([place])   . CKD (chronic kidney disease), stage II   . Congestive heart failure (CHF) ([place])   . Diabetes mellitus    Type II  .  Dyslipidemia   . Heart murmur   . Mild pulmonary hypertension ([place])   . Mitral valve prolapse    a. not seen on most recent echoes. Mild MR now.  Marland Kitchen NICM (nonischemic cardiomyopathy) ([place])   . Osteoporosis   . Pancreatitis, acute   . Pseudogout   . PVD (peripheral vascular disease) ([place])    a. 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  . Sinus tachycardia   . Tobacco abuse     Family History  Problem Relation Age of Onset  . Heart failure Mother   . Hypertension Sister   . Heart attack Brother   . Heart attack Maternal Uncle   . Heart disease Sister   . COPD Brother   . Congestive Heart Failure Brother   . Congestive Heart Failure Daughter   . Rheum arthritis Son   . Ovarian cancer Sister   . Coronary artery disease Neg Hx        Early    Past Surgical History:  Procedure Laterality Date  . ABDOMINAL AORTOGRAM W/LOWER EXTREMITY N/A 12/31/2019   Procedure: ABDOMINAL AORTOGRAM W/LOWER EXTREMITY;  Surgeon: Wellington Hampshire, MD;  Location: [facility];  Service: Cardiovascular;  Laterality: N/A;  . ABDOMINAL AORTOGRAM W/LOWER EXTREMITY N/A 09/01/2020   Procedure: ABDOMINAL AORTOGRAM W/LOWER EXTREMITY;  Surgeon: Wellington Hampshire, MD;  Location: [facility];  Service: Cardiovascular;  Laterality: N/A;  . APPENDECTOMY    . CORONARY STENT INTERVENTION N/A 10/13/2020   Procedure: CORONARY STENT INTERVENTION;  Surgeon: Leonie Man, MD;  Location: [facility];  Service: Cardiovascular;  Laterality: N/A;  . Hysterectomy-type unspecified    . INTRAVASCULAR PRESSURE WIRE/FFR STUDY N/A 10/13/2020   Procedure: INTRAVASCULAR PRESSURE WIRE/FFR STUDY;  Surgeon: Leonie Man, MD;  Location: [facility];  Service: Cardiovascular;  Laterality: N/A;  . LAPAROSCOPIC CHOLECYSTECTOMY  2009  . PERIPHERAL VASCULAR INTERVENTION  12/31/2019   Procedure: PERIPHERAL VASCULAR INTERVENTION;  Surgeon: Wellington Hampshire, MD;   Location: [facility];  Service: Cardiovascular;;  Bilateral Iliacs  . RIGHT/LEFT HEART CATH AND CORONARY ANGIOGRAPHY N/A 09/16/2018   Procedure: RIGHT/LEFT HEART CATH AND CORONARY ANGIOGRAPHY;  Surgeon: Sherren Mocha, MD;  Location: [facility];  Service: Cardiovascular;  Laterality: N/A;  . RIGHT/LEFT HEART CATH AND CORONARY ANGIOGRAPHY N/A 10/13/2020   Procedure: RIGHT/LEFT HEART CATH AND CORONARY ANGIOGRAPHY;  Surgeon: Jolaine Artist, MD;  Location: [facility];  Service: Cardiovascular;  Laterality: N/A;   Social History   Occupational History  . Occupation: Retired  Tobacco Use  . Smoking status: Current Every Day Smoker    Packs/day: 1.00  Years: 50.00    Pack years: 50.00    Types: Cigarettes  . Smokeless tobacco: Never Used  Vaping Use  . Vaping Use: Never used  Substance and Sexual Activity  . Alcohol use: No  . Drug use: No  . Sexual activity: Not Currently

## 2020-12-14 NOTE — Progress Notes (Signed)
Office Visit Note   Patient: Hannah French           Date of Birth: 10-20-1940           MRN: [id number] Visit Date: 11/30/2020              Requested by: Dettinger, Fransisca Kaufmann, MD [address] [place],  [place] [address] PCP: Dettinger, Fransisca Kaufmann, MD  Chief Complaint  Patient presents with  . Left Foot - Follow-up    2nd and 3rd toe follow up.       HPI: Patient is an 80 year old woman who presents in follow-up for ischemic changes left foot third and fourth toes she is status post revascularization surgery and has shown improvement.  Patient states she has had CNS changes with gabapentin teen she is currently on Xarelto and Plavix and tramadol as needed.  Assessment & Plan: Visit Diagnoses:  1. Gangrene of toe of left foot ([place])     Plan: Patient continues to show improvement recommended she take the tramadol twice to 3 times a day continue with minimized weightbearing.  Reevaluate in 2 weeks.  Follow-Up Instructions: Return in about 2 weeks (around 12/14/2020).   Ortho Exam  Patient is alert, oriented, no adenopathy, well-dressed, normal affect, normal respiratory effort. Examination patient has improved capillary refill approximately 3 seconds the third toe has completely healed she has a small 5 mm eschar on the plantar aspect of the fourth toe.  There is no swelling no drainage no cellulitis her left foot is showing good improvement status post revascularization.  Imaging: No results found. No images are attached to the encounter.  Labs: Lab Results  Component Value Date   HGBA1C 8.1 (H) 12/09/2020   HGBA1C 8.3 (H) 09/09/2020   HGBA1C 7.6 (H) 06/24/2020   LABURIC 9.0 (H) 10/08/2020     Lab Results  Component Value Date   ALBUMIN 3.7 10/09/2020   ALBUMIN 4.0 06/24/2020   ALBUMIN 3.9 02/17/2020    Lab Results  Component Value Date   MG 1.7 08/13/2018   Lab Results  Component Value Date   VD25OH 103.0 (H) 02/17/2020   VD25OH 54.9 08/08/2019    VD25OH 57.2 01/29/2019    No results found for: PREALBUMIN CBC EXTENDED Latest Ref Rng & Units 10/21/2020 10/13/2020 10/13/2020  WBC 4.0 - 10.5 K/uL 10.1 - -  RBC 3.87 - 5.11 MIL/uL 2.71(L) - -  HGB 12.0 - 15.0 g/dL 9.9(L) 10.5(L) 10.5(L)  HCT 36.0 - 46.0 % 29.4(L) 31.0(L) 31.0(L)  PLT 150 - 400 K/uL 272 - -  NEUTROABS 1.7 - 7.7 K/uL - - -  LYMPHSABS 0.7 - 4.0 K/uL - - -     There is no height or weight on file to calculate BMI.  Orders:  No orders of the defined types were placed in this encounter.  No orders of the defined types were placed in this encounter.    Procedures: No procedures performed  Clinical Data: No additional findings.  ROS:  All other systems negative, except as noted in the HPI. Review of Systems  Objective: Vital Signs: There were no vitals taken for this visit.  Specialty Comments:  No specialty comments available.  PMFS History: Patient Active Problem List   Diagnosis Date Noted  . Skin abscess 01/15/2020  . Depression, recurrent ([place]) 08/08/2019  . Cigarette smoker 06/19/2019  . Vitamin D deficiency 01/29/2019  . Dizziness 12/03/2018  . Chronic systolic CHF (congestive heart failure), NYHA class 2 (  [place]) 12/03/2018  . Coronary artery disease involving native coronary artery of native heart with angina pectoris ([place]) 10/04/2018  . Pain in right hand 10/02/2018  . Myalgia due to statin 03/29/2018  . COPD GOLD II / active smoker 10/26/2017  . Tobacco abuse 03/07/2016  . Type 2 diabetes mellitus ([place]) 08/26/2015  . Osteoporosis 09/02/2014  . Carotid stenosis 07/22/2012  . PVD 07/02/2009  . BLADDER CANCER 06/05/2009  . Hyperlipidemia associated with type 2 diabetes mellitus ([place]) 06/05/2009  . PSEUDOGOUT 06/05/2009  . MITRAL VALVE PROLAPSE 06/05/2009   Past Medical History:  Diagnosis Date  . Abdominal bruit   . Bladder cancer ([place])   . CAD (coronary artery disease)    a. nonobstructive by cath 08/2018.  . Carotid bruit   .  Chronic combined systolic and diastolic CHF (congestive heart failure) ([place])   . CKD (chronic kidney disease), stage II   . Congestive heart failure (CHF) ([place])   . Diabetes mellitus    Type II  . Dyslipidemia   . Heart murmur   . Mild pulmonary hypertension ([place])   . Mitral valve prolapse    a. not seen on most recent echoes. Mild MR now.  Marland Kitchen NICM (nonischemic cardiomyopathy) ([place])   . Osteoporosis   . Pancreatitis, acute   . Pseudogout   . PVD (peripheral vascular disease) ([place])    a. 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  . Sinus tachycardia   . Tobacco abuse     Family History  Problem Relation Age of Onset  . Heart failure Mother   . Hypertension Sister   . Heart attack Brother   . Heart attack Maternal Uncle   . Heart disease Sister   . COPD Brother   . Congestive Heart Failure Brother   . Congestive Heart Failure Daughter   . Rheum arthritis Son   . Ovarian cancer Sister   . Coronary artery disease Neg Hx        Early    Past Surgical History:  Procedure Laterality Date  . ABDOMINAL AORTOGRAM W/LOWER EXTREMITY N/A 12/31/2019   Procedure: ABDOMINAL AORTOGRAM W/LOWER EXTREMITY;  Surgeon: Wellington Hampshire, MD;  Location: [facility];  Service: Cardiovascular;  Laterality: N/A;  . ABDOMINAL AORTOGRAM W/LOWER EXTREMITY N/A 09/01/2020   Procedure: ABDOMINAL AORTOGRAM W/LOWER EXTREMITY;  Surgeon: Wellington Hampshire, MD;  Location: [facility];  Service: Cardiovascular;  Laterality: N/A;  . APPENDECTOMY    . CORONARY STENT INTERVENTION N/A 10/13/2020   Procedure: CORONARY STENT INTERVENTION;  Surgeon: Leonie Man, MD;  Location: [facility];  Service: Cardiovascular;  Laterality: N/A;  . Hysterectomy-type unspecified    . INTRAVASCULAR PRESSURE WIRE/FFR STUDY N/A 10/13/2020   Procedure: INTRAVASCULAR PRESSURE WIRE/FFR STUDY;  Surgeon: Leonie Man, MD;  Location: [facility];  Service:  Cardiovascular;  Laterality: N/A;  . LAPAROSCOPIC CHOLECYSTECTOMY  2009  . PERIPHERAL VASCULAR INTERVENTION  12/31/2019   Procedure: PERIPHERAL VASCULAR INTERVENTION;  Surgeon: Wellington Hampshire, MD;  Location: [facility];  Service: Cardiovascular;;  Bilateral Iliacs  . RIGHT/LEFT HEART CATH AND CORONARY ANGIOGRAPHY N/A 09/16/2018   Procedure: RIGHT/LEFT HEART CATH AND CORONARY ANGIOGRAPHY;  Surgeon: Sherren Mocha, MD;  Location: [facility];  Service: Cardiovascular;  Laterality: N/A;  . RIGHT/LEFT HEART CATH AND CORONARY ANGIOGRAPHY N/A 10/13/2020   Procedure: RIGHT/LEFT HEART CATH AND CORONARY ANGIOGRAPHY;  Surgeon: Jolaine Artist, MD;  Location: [facility];  Service: Cardiovascular;  Laterality: N/A;   Social History   Occupational History  . Occupation: Retired  Tobacco Use  . Smoking status: Current Every Day Smoker    Packs/day: 1.00    Years: 50.00    Pack years: 50.00    Types: Cigarettes  . Smokeless tobacco: Never Used  Vaping Use  . Vaping Use: Never used  Substance and Sexual Activity  . Alcohol use: No  . Drug use: No  . Sexual activity: Not Currently

## 2020-12-27 ENCOUNTER — Encounter: Payer: Self-pay | Admitting: Family Medicine

## 2020-12-27 ENCOUNTER — Ambulatory Visit (INDEPENDENT_AMBULATORY_CARE_PROVIDER_SITE_OTHER): Payer: Medicare Other | Admitting: Family Medicine

## 2020-12-27 DIAGNOSIS — J441 Chronic obstructive pulmonary disease with (acute) exacerbation: Secondary | ICD-10-CM

## 2020-12-27 MED ORDER — FLUTICASONE PROPIONATE 50 MCG/ACT NA SUSP: 1.0000 | Freq: Two times a day (BID) | NASAL | 6 refills | Status: DC | PRN

## 2020-12-27 MED ORDER — PREDNISONE 20 MG PO TABS: ORAL_TABLET | ORAL | 0 refills | Status: DC

## 2020-12-27 MED ORDER — DOXYCYCLINE HYCLATE 100 MG PO TABS: 100.0000 mg | ORAL_TABLET | Freq: Two times a day (BID) | ORAL | 0 refills | Status: DC

## 2020-12-27 NOTE — Progress Notes (Signed)
Virtual Visit via telephone Note  I connected with Hannah French on 12/27/20 at 1004 by telephone and verified that I am speaking with the correct person using two identifiers. Hannah French is currently located at home and patient are currently with her during visit. The provider, Fransisca Kaufmann Stanley Lyness, MD is located in their office at time of visit.  Call ended at 1012  I discussed the limitations, risks, security and privacy concerns of performing an evaluation and management service by telephone and the availability of in person appointments. I also discussed with the patient that there may be a patient responsible charge related to this service. The patient expressed understanding and agreed to proceed.   History and Present Illness: Patient is having a lot of sinus congestion and headache and some coughing and SOB and wheezing.  She denies any fevers or chills. This has been going on for 1 week and is worsening.  She does have some wheezing. She is using inhalers.  She has been using albuterol.  She has itchy eyes.   No diagnosis found.  Outpatient Encounter Medications as of 12/27/2020  Medication Sig  . acetaminophen (TYLENOL) 500 MG tablet Take 500 mg by mouth every 6 (six) hours as needed for moderate pain.  Marland Kitchen albuterol (VENTOLIN HFA) 108 (90 Base) MCG/ACT inhaler Inhale 2 puffs into the lungs every 6 (six) hours as needed for wheezing or shortness of breath.  . blood glucose meter kit and supplies Use up to four times daily as directed  . Cholecalciferol (VITAMIN D) 50 MCG (2000 UT) tablet Take 4,000 Units by mouth daily.   . clopidogrel (PLAVIX) 75 MG tablet Take 1 tablet (75 mg total) by mouth daily.  . cyclobenzaprine (FLEXERIL) 10 MG tablet TAKE 1 TABLET THREE TIMES DAILY FOR MUSCLE SPASM  . DULoxetine (CYMBALTA) 30 MG capsule Take 1 capsule (30 mg total) by mouth daily.  . empagliflozin (JARDIANCE) 10 MG TABS tablet Take 1 tablet (10 mg total) by mouth daily  before breakfast.  . Evolocumab (REPATHA) 140 MG/ML SOSY Inject 140 mg into the skin every 14 (fourteen) days.  . Fluticasone-Salmeterol (ADVAIR) 100-50 MCG/DOSE AEPB Inhale 1 puff into the lungs 2 (two) times daily.  . furosemide (LASIX) 20 MG tablet Take 2 tablets daily. You may take an extra dose for increase weight gain orswelling  . glimepiride (AMARYL) 2 MG tablet Take 1-2 tablets (2-4 mg total) by mouth daily with breakfast.  . glucose blood (ONETOUCH VERIO) test strip TEst BS BID and as needed Dx E11.9  . insulin degludec (TRESIBA FLEXTOUCH) 100 UNIT/ML FlexTouch Pen Inject 10 Units into the skin daily.  . Insulin Pen Needle (B-D UF III MINI PEN NEEDLES) 31G X 5 MM MISC 1 each by Does not apply route 4 (four) times daily.  . Lancets (ONETOUCH DELICA PLUS DUKGUR42H) MISC CHECK GLUCOSE UP TO 4 TIMES DAILY AS DIRECTED Dx E11.9  . metoprolol succinate (TOPROL-XL) 25 MG 24 hr tablet Take 1 tablet (25 mg total) by mouth daily.  . Naphazoline-Pheniramine (OPCON-A) 0.027-0.315 % SOLN Place 1 drop into both eyes daily as needed (Burning and itching eyes).  . nitroGLYCERIN (NITRODUR - DOSED IN MG/24 HR) 0.2 mg/hr patch Place 1 patch (0.2 mg total) onto the skin daily.  . nitroGLYCERIN (NITROSTAT) 0.3 MG SL tablet Place 1 tablet (0.3 mg total) under the tongue every 5 (five) minutes as needed for chest pain.  . pentoxifylline (TRENTAL) 400 MG CR tablet Take 1 tablet (  400 mg total) by mouth 3 (three) times daily with meals.  . rivaroxaban (XARELTO) 2.5 MG TABS tablet Take 1 tablet (2.5 mg total) by mouth 2 (two) times daily.  . traMADol (ULTRAM) 50 MG tablet Take 1 tablet (50 mg total) by mouth every 6 (six) hours as needed for moderate pain.   Facility-Administered Encounter Medications as of 12/27/2020  Medication  . methylPREDNISolone acetate (DEPO-MEDROL) injection 80 mg    Review of Systems  Constitutional: Negative for chills and fever.  HENT: Positive for congestion, ear discharge, ear pain,  postnasal drip, rhinorrhea, sinus pressure, sneezing and sore throat.   Eyes: Negative for pain, redness and visual disturbance.  Respiratory: Positive for cough, shortness of breath and wheezing. Negative for chest tightness.   Cardiovascular: Negative for chest pain and leg swelling.  Genitourinary: Negative for difficulty urinating and dysuria.  Musculoskeletal: Negative for back pain and gait problem.  Skin: Negative for rash.  Neurological: Negative for light-headedness and headaches.  Psychiatric/Behavioral: Negative for agitation and behavioral problems.  All other systems reviewed and are negative.   Observations/Objective: Patient sounds comfortable and in no acute distress  Assessment and Plan: Problem List Items Addressed This Visit   None   Visit Diagnoses    COPD exacerbation ([place])    -  Primary   Relevant Medications   predniSONE (DELTASONE) 20 MG tablet   fluticasone (FLONASE) 50 MCG/ACT nasal spray   doxycycline (VIBRA-TABS) 100 MG tablet    Sounds like COPD exacerbation, will do the prednisone and the doxycycline and the Flonase and also recommended Mucinex over-the-counter to help with continue with her inhalers, if worsens or does not improve she will let us know.  Follow up plan: Return if symptoms worsen or fail to improve.     I discussed the assessment and treatment plan with the patient. The patient was provided an opportunity to ask questions and all were answered. The patient agreed with the plan and demonstrated an understanding of the instructions.   The patient was advised to call back or seek an in-person evaluation if the symptoms worsen or if the condition fails to improve as anticipated.  The above assessment and management plan was discussed with the patient. The patient verbalized understanding of and has agreed to the management plan. Patient is aware to call the clinic if symptoms persist or worsen. Patient is aware when to return to the  clinic for a follow-up visit. Patient educated on when it is appropriate to go to the emergency department.    I provided 8 minutes of non-face-to-face time during this encounter.    Worthy Rancher, MD

## 2021-01-04 ENCOUNTER — Ambulatory Visit: Payer: Medicare Other | Admitting: Physician Assistant

## 2021-01-04 ENCOUNTER — Encounter: Payer: Self-pay | Admitting: Orthopedic Surgery

## 2021-01-04 DIAGNOSIS — I96 Gangrene, not elsewhere classified: Secondary | ICD-10-CM

## 2021-01-04 NOTE — Progress Notes (Signed)
Office Visit Note   Patient: Hannah French           Date of Birth: 1941-03-08           MRN: [id number] Visit Date: 01/04/2021              Requested by: Dettinger, Fransisca Kaufmann, MD [address] [place],  [place] [address] PCP: Dettinger, Fransisca Kaufmann, MD  Chief Complaint  Patient presents with  . Left Foot - Follow-up      HPI: Patient is a pleasant 80 year old woman we will continue to follow-up on her left foot.  She has a history of a revascularization procedure.  She is using a nitro patch and Trental.  She is still smoking.  Assessment & Plan: Visit Diagnoses: No diagnosis found.  Plan: Patient will continue with current treatment with Trental and patches.  Follow-up in 4 weeks or sooner if concern for any changes  Follow-Up Instructions: No follow-ups on file.   Ortho Exam  Patient is alert, oriented, no adenopathy, well-dressed, normal affect, normal respiratory effort. Examination some scabbing at the tips of the fourth third and fourth toes she does have a small area of thickened scab that she said was recently debrided and is asking it not be done today because it was quite painful.  There is no ascending cellulitis no ascending necrosis no swelling she has a biphasic pulse by Doppler  Imaging: No results found. No images are attached to the encounter.  Labs: Lab Results  Component Value Date   HGBA1C 8.1 (H) 12/09/2020   HGBA1C 8.3 (H) 09/09/2020   HGBA1C 7.6 (H) 06/24/2020   LABURIC 9.0 (H) 10/08/2020     Lab Results  Component Value Date   ALBUMIN 3.7 10/09/2020   ALBUMIN 4.0 06/24/2020   ALBUMIN 3.9 02/17/2020    Lab Results  Component Value Date   MG 1.7 08/13/2018   Lab Results  Component Value Date   VD25OH 103.0 (H) 02/17/2020   VD25OH 54.9 08/08/2019   VD25OH 57.2 01/29/2019    No results found for: PREALBUMIN CBC EXTENDED Latest Ref Rng & Units 10/21/2020 10/13/2020 10/13/2020  WBC 4.0 - 10.5 K/uL 10.1 - -  RBC 3.87 - 5.11 MIL/uL  2.71(L) - -  HGB 12.0 - 15.0 g/dL 9.9(L) 10.5(L) 10.5(L)  HCT 36.0 - 46.0 % 29.4(L) 31.0(L) 31.0(L)  PLT 150 - 400 K/uL 272 - -  NEUTROABS 1.7 - 7.7 K/uL - - -  LYMPHSABS 0.7 - 4.0 K/uL - - -     There is no height or weight on file to calculate BMI.  Orders:  No orders of the defined types were placed in this encounter.  No orders of the defined types were placed in this encounter.    Procedures: No procedures performed  Clinical Data: No additional findings.  ROS:  All other systems negative, except as noted in the HPI. Review of Systems  Objective: Vital Signs: There were no vitals taken for this visit.  Specialty Comments:  No specialty comments available.  PMFS History: Patient Active Problem List   Diagnosis Date Noted  . Skin abscess 01/15/2020  . Depression, recurrent ([place]) 08/08/2019  . Cigarette smoker 06/19/2019  . Vitamin D deficiency 01/29/2019  . Dizziness 12/03/2018  . Chronic systolic CHF (congestive heart failure), NYHA class 2 ([place]) 12/03/2018  . Coronary artery disease involving native coronary artery of native heart with angina pectoris ([place]) 10/04/2018  . Pain in right hand 10/02/2018  .  Myalgia due to statin 03/29/2018  . COPD GOLD II / active smoker 10/26/2017  . Tobacco abuse 03/07/2016  . Type 2 diabetes mellitus ([place]) 08/26/2015  . Osteoporosis 09/02/2014  . Carotid stenosis 07/22/2012  . PVD 07/02/2009  . BLADDER CANCER 06/05/2009  . Hyperlipidemia associated with type 2 diabetes mellitus ([place]) 06/05/2009  . PSEUDOGOUT 06/05/2009  . MITRAL VALVE PROLAPSE 06/05/2009   Past Medical History:  Diagnosis Date  . Abdominal bruit   . Bladder cancer ([place])   . CAD (coronary artery disease)    a. nonobstructive by cath 08/2018.  . Carotid bruit   . Chronic combined systolic and diastolic CHF (congestive heart failure) ([place])   . CKD (chronic kidney disease), stage II   . Congestive heart failure (CHF) ([place])   . Diabetes mellitus     Type II  . Dyslipidemia   . Heart murmur   . Mild pulmonary hypertension ([place])   . Mitral valve prolapse    a. not seen on most recent echoes. Mild MR now.  Marland Kitchen NICM (nonischemic cardiomyopathy) ([place])   . Osteoporosis   . Pancreatitis, acute   . Pseudogout   . PVD (peripheral vascular disease) ([place])    a. 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  . Sinus tachycardia   . Tobacco abuse     Family History  Problem Relation Age of Onset  . Heart failure Mother   . Hypertension Sister   . Heart attack Brother   . Heart attack Maternal Uncle   . Heart disease Sister   . COPD Brother   . Congestive Heart Failure Brother   . Congestive Heart Failure Daughter   . Rheum arthritis Son   . Ovarian cancer Sister   . Coronary artery disease Neg Hx        Early    Past Surgical History:  Procedure Laterality Date  . ABDOMINAL AORTOGRAM W/LOWER EXTREMITY N/A 12/31/2019   Procedure: ABDOMINAL AORTOGRAM W/LOWER EXTREMITY;  Surgeon: Wellington Hampshire, MD;  Location: [facility];  Service: Cardiovascular;  Laterality: N/A;  . ABDOMINAL AORTOGRAM W/LOWER EXTREMITY N/A 09/01/2020   Procedure: ABDOMINAL AORTOGRAM W/LOWER EXTREMITY;  Surgeon: Wellington Hampshire, MD;  Location: [facility];  Service: Cardiovascular;  Laterality: N/A;  . APPENDECTOMY    . CORONARY STENT INTERVENTION N/A 10/13/2020   Procedure: CORONARY STENT INTERVENTION;  Surgeon: Leonie Man, MD;  Location: [facility];  Service: Cardiovascular;  Laterality: N/A;  . Hysterectomy-type unspecified    . INTRAVASCULAR PRESSURE WIRE/FFR STUDY N/A 10/13/2020   Procedure: INTRAVASCULAR PRESSURE WIRE/FFR STUDY;  Surgeon: Leonie Man, MD;  Location: [facility];  Service: Cardiovascular;  Laterality: N/A;  . LAPAROSCOPIC CHOLECYSTECTOMY  2009  . PERIPHERAL VASCULAR INTERVENTION  12/31/2019   Procedure: PERIPHERAL VASCULAR INTERVENTION;  Surgeon: Wellington Hampshire,  MD;  Location: [facility];  Service: Cardiovascular;;  Bilateral Iliacs  . RIGHT/LEFT HEART CATH AND CORONARY ANGIOGRAPHY N/A 09/16/2018   Procedure: RIGHT/LEFT HEART CATH AND CORONARY ANGIOGRAPHY;  Surgeon: Sherren Mocha, MD;  Location: [facility];  Service: Cardiovascular;  Laterality: N/A;  . RIGHT/LEFT HEART CATH AND CORONARY ANGIOGRAPHY N/A 10/13/2020   Procedure: RIGHT/LEFT HEART CATH AND CORONARY ANGIOGRAPHY;  Surgeon: Jolaine Artist, MD;  Location: [facility];  Service: Cardiovascular;  Laterality: N/A;   Social History   Occupational History  . Occupation: Retired  Tobacco Use  . Smoking status: Current Every Day  Smoker    Packs/day: 1.00    Years: 50.00    Pack years: 50.00    Types: Cigarettes  . Smokeless tobacco: Never Used  Vaping Use  . Vaping Use: Never used  Substance and Sexual Activity  . Alcohol use: No  . Drug use: No  . Sexual activity: Not Currently

## 2021-01-06 ENCOUNTER — Encounter (HOSPITAL_COMMUNITY): Payer: Medicare Other | Admitting: Internal Medicine

## 2021-01-09 NOTE — Progress Notes (Signed)
Patient did not show for appt. Note left for templating purposes only.    [facility]ED HF CLINIC NOTE  Date:  01/09/2021   PCP:  Dettinger, Fransisca Kaufmann, MD  Cardiologist:  Glori Bickers, MD  Electrophysiologist:  None   Referring Provider: Melina Copa PA-C  Chief Complaint:  HF  History of Present Illness:   Hannah French is a 80 y.o. female with history of  CAD s/p LAD stent 1/28, systolic HF due to mixed iCM/NICM, PAD with bilateral subclavian stenosis and bilateral iliac artery stenosis (managed medically), longstanding tobacco use with COPD, DM2.   Saw Dr. Burt Knack 12/19 due to SOB. EKG showed new LBBB.An echo was done which showed new onset LV dysfunction EF 30-35%.  Underwent R/L cath which showed nonobstructive CAD   Her medical therapy has been limited by low BP and dizziness (beta blocker reduced, losartan stopped). Corlanor was started for elevated HR and soft BP but not yet approved, F/u echo 04/28/19 showed EF 25-30%, diffuse HK, Grade II DD,  mildly dilated LA, mild MR.   She was seen for the first time in the HF Clinic in 8/20. Was doing fairly well. Felt SOB mostly due to COPD. Hall walk with sats 93-96%. Unable to titrate HF meds due to low BP and dizziness  Underwent stenting of bilateral common iliac artery stent placement extending into the distal aorta on 12/31/19  Seen in HF Clinic c/o CP -> cath 1/22 showed 75% lesion LAD -> PCI mLAD.  Echo 1/22 EF 40-45%.  Today she returns for HF follow up. Overall feeling ok but remains tired. Main complaint today is regarding her toe pain. No longer having chest tightness. Remains SOB with exertion. No chest pain. Denies PND/Orthopnea. Appetite ok. No fever or chills. Weight at home  pounds. Taking all medications. Unable to take gabapentin. Smoking 1 PPD.     Cardiac Studies:  LHC/RHC  10/13/20 Preserved cardia output, low filling pressure, and EF 60%  Prox LAD-1 lesion is 40% stenosed. DFR negative at 0.93 Prox  LAD-2 lesion is 75% stenosed. DFR +0.83 A drug-eluting stent was successfully placed covering the lesion #2, using a STENT RESOLUTE ONYX 2.5X15. Postdilated to 2.75 mm Post intervention, there is a 0% residual stenosis.  Successful DFR guided DES PCI of mid LAD -> using Resolute Onyx DES 2.5 mm x 15 mm postdilated to 2.75 mm  See recommendations for antiplatelet/anticoagulant therapy    - R/LHC 12/19: Prox LAD lesion is 40% stenosed. Nonobstructed CAD, severe LV systolic dysfunction, mild pulm hytertension and mild elevated LVEDP, preserved CO-->Recommend: Medical therapy for congestive heart failure and nonobstructive CAD.  Fick CO: 5.29 L/min Fick CI: 7.86 L/min RV systolic: 42 mm Hg RV diastolic: 1 mm Hg RV EDP: 7 mm Hg PA systolic: 40 mm Hg PA diastolic: 17 mm Hg PA mean: 28 mm Hg LV systolic: 767 mm Hg LV diastolic: 6 mm Hg LVEDP: 10 mm Hg  - Echo 12/19 EF: 30-35% with diffuse hypokinesis. Cath 12/19 nonobstructive CAD. - Echo 8/20 EF: 25-30% with Grade II DD.  PFTs 06/11/19  FEV1 0.96L (55%) FVC 1.61L (68%) Ratio60% FEF25-75  0.40 (29%) DLCO 50%  Past Medical History:  Diagnosis Date   Abdominal bruit    Bladder cancer ([facility])    CAD (coronary artery disease)    a. nonobstructive by cath 08/2018.   Carotid bruit    Chronic combined systolic and diastolic CHF (congestive heart failure) ([facility])    CKD (chronic  kidney disease), stage II    Congestive heart failure (CHF) ([facility])    Diabetes mellitus    Type II   Dyslipidemia    Heart murmur    Mild pulmonary hypertension ([facility])    Mitral valve prolapse    a. not seen on most recent echoes. Mild MR now.   NICM (nonischemic cardiomyopathy) ([place])    Osteoporosis    Pancreatitis, acute    Pseudogout    PVD (peripheral vascular disease) ([place])    a. 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   Sinus tachycardia    Tobacco abuse     Past Surgical History:  Procedure Laterality  Date   ABDOMINAL AORTOGRAM W/LOWER EXTREMITY N/A 12/31/2019   Procedure: ABDOMINAL AORTOGRAM W/LOWER EXTREMITY;  Surgeon: Wellington Hampshire, MD;  Location: [facility];  Service: Cardiovascular;  Laterality: N/A;   ABDOMINAL AORTOGRAM W/LOWER EXTREMITY N/A 09/01/2020   Procedure: ABDOMINAL AORTOGRAM W/LOWER EXTREMITY;  Surgeon: Wellington Hampshire, MD;  Location: [facility];  Service: Cardiovascular;  Laterality: N/A;   APPENDECTOMY     CORONARY STENT INTERVENTION N/A 10/13/2020   Procedure: CORONARY STENT INTERVENTION;  Surgeon: Leonie Man, MD;  Location: [facility];  Service: Cardiovascular;  Laterality: N/A;   Hysterectomy-type unspecified     INTRAVASCULAR PRESSURE WIRE/FFR STUDY N/A 10/13/2020   Procedure: INTRAVASCULAR PRESSURE WIRE/FFR STUDY;  Surgeon: Leonie Man, MD;  Location: [facility];  Service: Cardiovascular;  Laterality: N/A;   LAPAROSCOPIC CHOLECYSTECTOMY  2009   PERIPHERAL VASCULAR INTERVENTION  12/31/2019   Procedure: PERIPHERAL VASCULAR INTERVENTION;  Surgeon: Wellington Hampshire, MD;  Location: [facility];  Service: Cardiovascular;;  Bilateral Iliacs   RIGHT/LEFT HEART CATH AND CORONARY ANGIOGRAPHY N/A 09/16/2018   Procedure: RIGHT/LEFT HEART CATH AND CORONARY ANGIOGRAPHY;  Surgeon: Sherren Mocha, MD;  Location: [facility];  Service: Cardiovascular;  Laterality: N/A;   RIGHT/LEFT HEART CATH AND CORONARY ANGIOGRAPHY N/A 10/13/2020   Procedure: RIGHT/LEFT HEART CATH AND CORONARY ANGIOGRAPHY;  Surgeon: Jolaine Artist, MD;  Location: [facility];  Service: Cardiovascular;  Laterality: N/A;    Current Medications: No outpatient medications have been marked as taking for the 01/10/21 encounter [facility] Encounter) with Destry Dauber, Shaune Pascal, MD.   Current Facility-Administered Medications for the 01/10/21 encounter [facility] Encounter) with Cevin Rubinstein, Shaune Pascal, MD  Medication   methylPREDNISolone acetate (DEPO-MEDROL)  injection 80 mg     Allergies:   Contrast media [iodinated diagnostic agents], Iohexol, Metformin, Statins, Bextra [valdecoxib], Cefdinir, Cozaar [losartan potassium], Gabapentin, Nsaids, Zetia [ezetimibe], Rofecoxib, and Sulfa antibiotics   Social History   Socioeconomic History   Marital status: Divorced    Spouse name: Not on file   Number of children: 2   Years of education: Some College    Highest education level: Some college, no degree  Occupational History   Occupation: Retired  Tobacco Use   Smoking status: Current Every Day Smoker    Packs/day: 1.00    Years: 50.00    Pack years: 50.00    Types: Cigarettes   Smokeless tobacco: Never Used  Scientific laboratory technician Use: Never used  Substance and Sexual Activity   Alcohol use: No   Drug use: No   Sexual activity: Not Currently  Other Topics Concern   Not on file  Social History Narrative   Not on file   Social Determinants of Health   Financial Resource Strain: Not on file  Food Insecurity: Not on file  Transportation Needs: Not on file  Physical Activity: Not on file  Stress: Not on file  Social Connections: Not on file     Family History:  [name] family history includes COPD in her brother; Congestive Heart Failure in her brother and daughter; Heart attack in her brother and maternal uncle; Heart disease in her sister; Heart failure in her mother; Hypertension in her sister; Ovarian cancer in her sister; Rheum arthritis in her son. There is no history of Coronary artery disease.   EKGs/Labs/Other Studies Reviewed:    Recent Labs: 10/09/2020: ALT 17 10/21/2020: BUN 39; Creatinine, Ser 1.50; Hemoglobin 9.9; Platelets 272; Potassium 4.7; Sodium 137  Recent Lipid Panel    Component Value Date/Time   CHOL 157 02/17/2020 1159   TRIG 129 02/17/2020 1159   TRIG 82 06/09/2014 1028   HDL 41 02/17/2020 1159   HDL 57 06/09/2014 1028   CHOLHDL 3.8 02/17/2020 1159   CHOLHDL 3.5 02/06/2013 0932   VLDL 20  02/06/2013 0932   LDLCALC 93 02/17/2020 1159   LDLCALC 131 (H) 06/09/2014 1028    PHYSICAL EXAM:    There were no vitals filed for this visit.   Wt Readings from Last 3 Encounters:  12/09/20 51.3 kg (113 lb 2 oz)  11/01/20 51.3 kg (113 lb)  10/21/20 51 kg (112 lb 6.4 oz)   General:  Arrived in a wheel chair.  No resp difficulty HEENT: normal Neck: supple. no JVD. Carotids 2+ bilat; no bruits. No lymphadenopathy or thryomegaly appreciated. Cor: PMI nondisplaced. Regular rate & rhythm. No rubs, gallops or murmurs. Lungs: EW throughout.  Abdomen: soft, nontender, nondistended. No hepatosplenomegaly. No bruits or masses. Good bowel sounds. Extremities: no cyanosis, clubbing, rash, edema Neuro: alert & orientedx3, cranial nerves grossly intact. moves all 4 extremities w/o difficulty. Affect pleasant   ASSESSMENT & PLAN:   1. CAD - 1/19/22S/P DES mid LAD lesion.  -Continue Rivaroxaban + Clopidogrel 75mg  daily for 12 months . If there are significant bleeding complications can stop clopidogrel  after 6 months. - No s/s angina - Intolerant of statins. On repatha  2. Chronic systolic HF  - Due to NICM (? LBBB but not very wide)  - Echo 12/19 EF 30-35% with diffuse hypokinesis. Cath 12/19 nonobstructive CAD. - Echo 8/20 EF 25-30% with Grade II DD. - Echo 1/22 EF 40-45%  - Stable NYHA III. Volume status stabl.e  - - Intolerant of meds due to low BP and dizziness (She does have a BP differential between arms so should likely be using the right arm to obtain accurate readings) - Continue Toprol 25 daily. - Continue digoxin 0.125 daily. - Continue Jardiance. - Losartan stopped due to low BP and hyperkalemia. - PFTs quite severe and likely major limiting factor   3. 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 Fletcher Anon - She is s/p stenting of bilateral common iliac artery stent placement extending into the distal aorta.  - Unable to take  gabapentin. . - On Xarelto  5. COPD with ongoing tobacco use. - PFTs 9/20 with severe COPD. - Counseled again omn need for smoking cessation   6. DM2 - Continue Jardiance. - Followed by PCP.  Follow up in 3 months with Dr Haroldine Laws.   Signed, Glori Bickers, MD  01/09/2021 8:17 PM

## 2021-01-10 ENCOUNTER — Inpatient Hospital Stay (HOSPITAL_COMMUNITY)
Admission: RE | Admit: 2021-01-10 | Discharge: 2021-01-10 | Disposition: A | Discharge status: Home / Self Care | Payer: Medicare Other | Source: Ambulatory Visit | Attending: Internal Medicine | Admitting: Internal Medicine

## 2021-01-10 ENCOUNTER — Encounter (HOSPITAL_COMMUNITY): Payer: Self-pay

## 2021-01-10 ENCOUNTER — Telehealth (HOSPITAL_COMMUNITY): Payer: Self-pay | Admitting: Internal Medicine

## 2021-01-10 NOTE — Telephone Encounter (Signed)
Daughter called to r/s appt, need sooner appt than June, call daughter [phone], Thanks

## 2021-01-11 NOTE — Progress Notes (Signed)
ADVANCED HF CLINIC NOTE  Date:  01/12/2021   PCP:  Dettinger, Fransisca Kaufmann, MD  Cardiologist:  Glori Bickers, MD  Electrophysiologist:  None   Referring Provider: Melina Copa PA-C  Chief Complaint:  HF  History of Present Illness:   Hannah French Hannah French is a 80 y.o. 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   Saw Dr. Burt Knack 12/19 due to SOB. EKG showed new LBBB.An echo was done which showed new onset LV dysfunction EF 30-35%.  Underwent R/L cath which showed nonobstructive CAD   Her medical therapy has been limited by low BP and dizziness (beta blocker reduced, losartan stopped). Corlanor was started for elevated HR and soft BP but not yet approved, F/u echo 04/28/19 showed EF 25-30%, diffuse HK, Grade II DD,  mildly dilated LA, mild MR.   She was seen for the first time in the HF Clinic in 8/20. Was doing fairly well. Felt SOB mostly due to COPD. Hall walk with sats 93-96%. Unable to titrate HF meds due to low BP and dizziness  Underwent stenting of bilateral common iliac artery stent placement extending into the distal aorta on 12/31/19  Seen in HF Clinic c/o CP -> cath 1/22 showed 75% lesion LAD -> PCI mLAD.  Echo 1/22 EF 40-45%.  Today she returns for HF follow up. Says she is mor SOB and has her heart racing about 5-6 times per day  + ankle edema. Occasional CP. Recently treated for PNA. Following with Dr. Sharol Given for toe ulcers,     Cardiac Studies:   [facility]  10/13/20  Preserved cardia output, low filling pressure, and EF 60%   Prox LAD-1 lesion is 40% stenosed. DFR negative at 0.93  Prox LAD-2 lesion is 75% stenosed. DFR +0.83  A drug-eluting stent was successfully placed covering the lesion #2, using a STENT RESOLUTE ONYX 2.5X15. Postdilated to 2.75 mm  Post intervention, there is a 0% residual  stenosis.  Successful DFR guided DES PCI of mid LAD -> using Resolute Onyx DES 2.5 mm x 15 mm postdilated to 2.75 mm   See recommendations for antiplatelet/anticoagulant therapy    - R/LHC 12/19: Prox LAD lesion is 40% stenosed. Nonobstructed CAD, severe LV systolic dysfunction, mild pulm hytertension and mild elevated LVEDP, preserved CO-->Recommend: Medical therapy for congestive heart failure and nonobstructive CAD.  Fick CO: 5.29 L/min Fick CI: 1.09 L/min RV systolic: 42 mm Hg RV diastolic: 1 mm Hg RV EDP: 7 mm Hg PA systolic: 40 mm Hg PA diastolic: 17 mm Hg PA mean: 28 mm Hg LV systolic: 323 mm Hg LV diastolic: 6 mm Hg LVEDP: 10 mm Hg  - Echo 12/19 EF: 30-35% with diffuse hypokinesis. Cath 12/19 nonobstructive CAD. - Echo 8/20 EF: 25-30% with Grade II DD.  PFTs 06/11/19  FEV1 0.96L (55%) FVC 1.61L (68%) Ratio60% FEF25-75  0.40 (29%) DLCO 50%  Past Medical History:  Diagnosis Date  . Abdominal bruit   . Bladder cancer ([place])   . CAD (coronary artery disease)    a. nonobstructive by cath 08/2018.  . Carotid bruit   . Chronic combined systolic and diastolic CHF (congestive heart failure) ([place])   . CKD (chronic kidney disease), stage II   . Congestive heart failure (CHF) ([place])   . Diabetes mellitus    Type II  . Dyslipidemia   .  Heart murmur   . Mild pulmonary hypertension ([place])   . Mitral valve prolapse    a. not seen on most recent echoes. Mild MR now.  Marland Kitchen NICM (nonischemic cardiomyopathy) ([place])   . Osteoporosis   . Pancreatitis, acute   . Pseudogout   . PVD (peripheral vascular disease) ([place])    a. 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  . Sinus tachycardia   . Tobacco abuse     Past Surgical History:  Procedure Laterality Date  . ABDOMINAL AORTOGRAM W/LOWER EXTREMITY N/A 12/31/2019   Procedure: ABDOMINAL AORTOGRAM W/LOWER EXTREMITY;  Surgeon: Wellington Hampshire, MD;  Location: [place] [facility];  Service:  Cardiovascular;  Laterality: N/A;  . ABDOMINAL AORTOGRAM W/LOWER EXTREMITY N/A 09/01/2020   Procedure: ABDOMINAL AORTOGRAM W/LOWER EXTREMITY;  Surgeon: Wellington Hampshire, MD;  Location: [place] [facility];  Service: Cardiovascular;  Laterality: N/A;  . APPENDECTOMY    . CORONARY STENT INTERVENTION N/A 10/13/2020   Procedure: CORONARY STENT INTERVENTION;  Surgeon: Leonie Man, MD;  Location: [place] [facility];  Service: Cardiovascular;  Laterality: N/A;  . Hysterectomy-type unspecified    . INTRAVASCULAR PRESSURE WIRE/FFR STUDY N/A 10/13/2020   Procedure: INTRAVASCULAR PRESSURE WIRE/FFR STUDY;  Surgeon: Leonie Man, MD;  Location: [place] [facility];  Service: Cardiovascular;  Laterality: N/A;  . LAPAROSCOPIC CHOLECYSTECTOMY  2009  . PERIPHERAL VASCULAR INTERVENTION  12/31/2019   Procedure: PERIPHERAL VASCULAR INTERVENTION;  Surgeon: Wellington Hampshire, MD;  Location: [place] [facility];  Service: Cardiovascular;;  Bilateral [place]li[place]  . RIGHT/LEFT HEART CATH AND CORONARY ANGIOGRAPHY N/A 09/16/2018   Procedure: RIGHT/LEFT HEART CATH AND CORONARY ANGIOGRAPHY;  Surgeon: Sherren Mocha, MD;  Location: [place] [facility];  Service: Cardiovascular;  Laterality: N/A;  . RIGHT/LEFT HEART CATH AND CORONARY ANGIOGRAPHY N/A 10/13/2020   Procedure: RIGHT/LEFT HEART CATH AND CORONARY ANGIOGRAPHY;  Surgeon: Jolaine Artist, MD;  Location: [place] [facility];  Service: Cardiovascular;  Laterality: N/A;    Current Medications: Current Meds  Medication Sig  . acetaminophen (TYLENOL) 500 MG tablet Take 500 mg by mouth every 6 (six) hours as needed for moderate pain.  Marland Kitchen albuterol (VENTOLIN HFA) 108 (90 Base) MCG/ACT inhaler Inhale 2 puffs into the lungs every 6 (six) hours as needed for wheezing or shortness of breath.  . blood glucose meter kit and supplies Use up to four times daily as directed  . Cholecalciferol (VITAMIN D) 50 MCG (2000 UT) tablet Take 4,000 Units by mouth daily.   . clopidogrel  (PLAVIX) 75 MG tablet Take 1 tablet (75 mg total) by mouth daily.  . cyclobenzaprine (FLEXERIL) 10 MG tablet TAKE 1 TABLET THREE TIMES DAILY FOR MUSCLE SPASM  . empagliflozin (JARDIANCE) 10 MG TABS tablet Take 1 tablet (10 mg total) by mouth daily before breakfast.  . Fluticasone-Salmeterol (ADVAIR) 100-50 MCG/DOSE AEPB Inhale 1 puff into the lungs 2 (two) times daily.  . furosemide (LASIX) 20 MG tablet Take 2 tablets daily. You may take an extra dose for increase weight gain orswelling  . glimepiride (AMARYL) 2 MG tablet Take 1-2 tablets (2-4 mg total) by mouth daily with breakfast.  . glucose blood (ONETOUCH VERIO) test strip TEst BS BID and as needed Dx E11.9  . insulin degludec (TRESIBA FLEXTOUCH) 100 UNIT/ML FlexTouch Pen Inject 10 Units into the skin daily.  . Insulin Pen Needle (B-D UF III MINI PEN NEEDLES) 31G X 5 MM MISC 1 each by Does not apply route 4 (four)  times daily.  . Lancets (ONETOUCH DELICA PLUS [id number]) MISC CHECK GLUCOSE UP TO 4 TIMES DAILY AS DIRECTED Dx E11.9  . metoprolol succinate (TOPROL-XL) 25 MG 24 hr tablet Take 1 tablet (25 mg total) by mouth daily.  . Naphazoline-Pheniramine (OPCON-A) 0.027-0.315 % SOLN Place 1 drop into both eyes daily as needed (Burning and itching eyes).  . nitroGLYCERIN (NITRODUR - DOSED IN MG/24 HR) 0.2 mg/hr patch Place 1 patch (0.2 mg total) onto the skin daily.  . nitroGLYCERIN (NITROSTAT) 0.3 MG SL tablet Place 1 tablet (0.3 mg total) under the tongue every 5 (five) minutes as needed for chest pain.  . pentoxifylline (TRENTAL) 400 MG CR tablet Take 1 tablet (400 mg total) by mouth 3 (three) times daily with meals.  . rivaroxaban (XARELTO) 2.5 MG TABS tablet Take 1 tablet (2.5 mg total) by mouth 2 (two) times daily.  . traMADol (ULTRAM) 50 MG tablet Take 1 tablet (50 mg total) by mouth every 6 (six) hours as needed for moderate pain.  . [DISCONTINUED] fluticasone (FLONASE) 50 MCG/ACT nasal spray Place 1 spray into both nostrils 2 (two) times  daily as needed for allergies or rhinitis.   Current Facility-Administered Medications for the 01/12/21 encounter [facility] Encounter) with Rojean Ige, Shaune Pascal, MD  Medication  . methylPREDNISolone acetate (DEPO-MEDROL) injection 80 mg     Allergies:   Contrast media [iodinated diagnostic agents], Iohexol, Metformin, Statins, Bextra [valdecoxib], Cefdinir, Cozaar [losartan potassium], Gabapentin, Nsaids, Zetia [ezetimibe], Rofecoxib, and Sulfa antibiotics   Social History   Socioeconomic History  . Marital status: Divorced    Spouse name: Not on file  . Number of children: 2  . Years of education: Some College   . Highest education level: Some college, no degree  Occupational History  . Occupation: Retired  Tobacco Use  . Smoking status: Current Every Day Smoker    Packs/day: 1.00    Years: 50.00    Pack years: 50.00    Types: Cigarettes  . Smokeless tobacco: Never Used  Vaping Use  . Vaping Use: Never used  Substance and Sexual Activity  . Alcohol use: No  . Drug use: No  . Sexual activity: Not Currently  Other Topics Concern  . Not on file  Social History Narrative  . Not on file   Social Determinants of Health   Financial Resource Strain: Not on file  Food Insecurity: Not on file  Transportation Needs: Not on file  Physical Activity: Not on file  Stress: Not on file  Social Connections: Not on file     Family History:  [name] family history includes COPD in her brother; Congestive Heart Failure in her brother and daughter; Heart attack in her brother and maternal uncle; Heart disease in her sister; Heart failure in her mother; Hypertension in her sister; Ovarian cancer in her sister; Rheum arthritis in her son. There is no history of Coronary artery disease.   EKGs/Labs/Other Studies Reviewed:    Recent Labs: 10/09/2020: ALT 17 10/21/2020: BUN 39; Creatinine, Ser 1.50; Hemoglobin 9.9; Platelets 272; Potassium 4.7; Sodium 137  Recent Lipid Panel     Component Value Date/Time   CHOL 157 02/17/2020 1159   TRIG 129 02/17/2020 1159   TRIG 82 06/09/2014 1028   HDL 41 02/17/2020 1159   HDL 57 06/09/2014 1028   CHOLHDL 3.8 02/17/2020 1159   CHOLHDL 3.5 02/06/2013 0932   VLDL 20 02/06/2013 0932   LDLCALC 93 02/17/2020 1159   LDLCALC 131 (H) 06/09/2014 1028  PHYSICAL EXAM:    Vitals:   01/12/21 0935  BP: 90/60  Pulse: 81  SpO2: 96%     Wt Readings from Last 3 Encounters:  01/12/21 51.4 kg (113 lb 6.4 oz)  12/09/20 51.3 kg (113 lb 2 oz)  11/01/20 51.3 kg (113 lb)    General:  Elderly. No resp difficulty HEENT: normal Neck: supple. no JVD. Carotids 2+ bilat; no bruits. No lymphadenopathy or thryomegaly appreciated. Cor: PMI nondisplaced. Regular rate & rhythm. No rubs, gallops or murmurs. Lungs: decreased throughout Abdomen: soft, nontender, nondistended. No hepatosplenomegaly. No bruits or masses. Good bowel sounds. Extremities: no cyanosis, clubbing, rash, edema Neuro: alert & orientedx3, cranial nerves grossly intact. moves all 4 extremities w/o difficulty. Affect pleasant    ASSESSMENT & PLAN:   1. CAD - 1/19/22S/P DES mid LAD lesion.  -Continue Rivaroxaban + Clopidogrel 53m daily for 12 months . If there are significant bleeding complications can stop clopidogrel  after 6 months. - No s/s angina - Intolerant of statins. On repatha  2. Chronic systolic HF  - Due to NICM (? LBBB but not very wide)  - Echo 12/19 EF 30-35% with diffuse hypokinesis. Cath 12/19 nonobstructive CAD. - Echo 8/20 EF 25-30% with Grade II DD. - Echo 1/22 EF 40-45%  - Stable NYHA III. Volume status looks stable.  -  Intolerant of meds due to low BP and dizziness (She does have a BP differential between arms so should likely be using the right arm to obtain accurate readings) - Continue Toprol 25 daily. - Off digoxin due to high level - Continue Jardiance. - Losartan stopped due to low BP and hyperkalemia. - No room to titrate GDMT -  PFTs quite severe and likely major limiting factor   3. 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 AFletcher Anon- Now following with Dr. DSharol Givenfor toe ulcers - She is s/p stenting of bilateral common iliac artery stent placement extending into the distal aorta.  - Unable to take gabapentin. . - On Xarelto - Discussed need to stop smoking   5. COPD with ongoing tobacco use. - PFTs 9/20 with severe COPD. - Discussed need for smoking cessation   6. DM2 - Continue Jardiance. - Followed by PCP.  7. Palpitations - place zio  Signed, DGlori Bickers MD  01/12/2021 9:56 AM

## 2021-01-12 ENCOUNTER — Other Ambulatory Visit (HOSPITAL_COMMUNITY): Payer: Self-pay | Admitting: Internal Medicine

## 2021-01-12 ENCOUNTER — Other Ambulatory Visit: Payer: Self-pay

## 2021-01-12 ENCOUNTER — Ambulatory Visit (HOSPITAL_COMMUNITY)
Admission: RE | Admit: 2021-01-12 | Discharge: 2021-01-12 | Disposition: A | Discharge status: Home / Self Care | Payer: Medicare Other | Source: Ambulatory Visit | Attending: Internal Medicine | Admitting: Internal Medicine

## 2021-01-12 ENCOUNTER — Encounter (HOSPITAL_COMMUNITY): Payer: Self-pay | Admitting: Internal Medicine

## 2021-01-12 VITALS — BP 90/60 | HR 81 | Wt 113.4 lb

## 2021-01-12 DIAGNOSIS — E11621 Type 2 diabetes mellitus with foot ulcer: Secondary | ICD-10-CM | POA: Insufficient documentation

## 2021-01-12 DIAGNOSIS — I447 Left bundle-branch block, unspecified: Secondary | ICD-10-CM | POA: Insufficient documentation

## 2021-01-12 DIAGNOSIS — Z720 Tobacco use: Secondary | ICD-10-CM

## 2021-01-12 DIAGNOSIS — R002 Palpitations: Secondary | ICD-10-CM

## 2021-01-12 DIAGNOSIS — Z79899 Other long term (current) drug therapy: Secondary | ICD-10-CM | POA: Insufficient documentation

## 2021-01-12 DIAGNOSIS — Z886 Allergy status to analgesic agent status: Secondary | ICD-10-CM | POA: Diagnosis not present

## 2021-01-12 DIAGNOSIS — I771 Stricture of artery: Secondary | ICD-10-CM | POA: Insufficient documentation

## 2021-01-12 DIAGNOSIS — E1122 Type 2 diabetes mellitus with diabetic chronic kidney disease: Secondary | ICD-10-CM | POA: Diagnosis not present

## 2021-01-12 DIAGNOSIS — Z7902 Long term (current) use of antithrombotics/antiplatelets: Secondary | ICD-10-CM | POA: Diagnosis not present

## 2021-01-12 DIAGNOSIS — Z888 Allergy status to other drugs, medicaments and biological substances status: Secondary | ICD-10-CM | POA: Diagnosis not present

## 2021-01-12 DIAGNOSIS — Z8249 Family history of ischemic heart disease and other diseases of the circulatory system: Secondary | ICD-10-CM | POA: Diagnosis not present

## 2021-01-12 DIAGNOSIS — I5022 Chronic systolic (congestive) heart failure: Secondary | ICD-10-CM | POA: Insufficient documentation

## 2021-01-12 DIAGNOSIS — F17210 Nicotine dependence, cigarettes, uncomplicated: Secondary | ICD-10-CM | POA: Diagnosis not present

## 2021-01-12 DIAGNOSIS — I739 Peripheral vascular disease, unspecified: Secondary | ICD-10-CM

## 2021-01-12 DIAGNOSIS — Z794 Long term (current) use of insulin: Secondary | ICD-10-CM | POA: Diagnosis not present

## 2021-01-12 DIAGNOSIS — Z7901 Long term (current) use of anticoagulants: Secondary | ICD-10-CM | POA: Diagnosis not present

## 2021-01-12 DIAGNOSIS — Z7984 Long term (current) use of oral hypoglycemic drugs: Secondary | ICD-10-CM | POA: Insufficient documentation

## 2021-01-12 DIAGNOSIS — Z882 Allergy status to sulfonamides status: Secondary | ICD-10-CM | POA: Diagnosis not present

## 2021-01-12 DIAGNOSIS — Z881 Allergy status to other antibiotic agents status: Secondary | ICD-10-CM | POA: Insufficient documentation

## 2021-01-12 DIAGNOSIS — Z7951 Long term (current) use of inhaled steroids: Secondary | ICD-10-CM | POA: Insufficient documentation

## 2021-01-12 DIAGNOSIS — I2510 Atherosclerotic heart disease of native coronary artery without angina pectoris: Secondary | ICD-10-CM | POA: Diagnosis not present

## 2021-01-12 DIAGNOSIS — Z955 Presence of coronary angioplasty implant and graft: Secondary | ICD-10-CM | POA: Insufficient documentation

## 2021-01-12 DIAGNOSIS — J449 Chronic obstructive pulmonary disease, unspecified: Secondary | ICD-10-CM | POA: Diagnosis not present

## 2021-01-12 DIAGNOSIS — E1151 Type 2 diabetes mellitus with diabetic peripheral angiopathy without gangrene: Secondary | ICD-10-CM | POA: Diagnosis not present

## 2021-01-12 LAB — CBC
HCT: 34.1 % — ABNORMAL LOW (ref 36.0–46.0)
Hemoglobin: 11.1 g/dL — ABNORMAL LOW (ref 12.0–15.0)
MCH: 34.6 pg — ABNORMAL HIGH (ref 26.0–34.0)
MCHC: 32.6 g/dL (ref 30.0–36.0)
MCV: 106.2 fL — ABNORMAL HIGH (ref 80.0–100.0)
Platelets: 284 10*3/uL (ref 150–400)
RBC: 3.21 MIL/uL — ABNORMAL LOW (ref 3.87–5.11)
RDW: 19.9 % — ABNORMAL HIGH (ref 11.5–15.5)
WBC: 12.2 10*3/uL — ABNORMAL HIGH (ref 4.0–10.5)
nRBC: 0.2 % (ref 0.0–0.2)

## 2021-01-12 LAB — BASIC METABOLIC PANEL
Anion gap: 9 (ref 5–15)
BUN: 30 mg/dL — ABNORMAL HIGH (ref 8–23)
CO2: 30 mmol/L (ref 22–32)
Calcium: 9.5 mg/dL (ref 8.9–10.3)
Chloride: 101 mmol/L (ref 98–111)
Creatinine, Ser: 1.3 mg/dL — ABNORMAL HIGH (ref 0.44–1.00)
GFR, Estimated: 42 mL/min — ABNORMAL LOW (ref >60)
Glucose, Bld: 94 mg/dL (ref 70–99)
Potassium: 4.3 mmol/L (ref 3.5–5.1)
Sodium: 140 mmol/L (ref 135–145)

## 2021-01-12 LAB — BRAIN NATRIURETIC PEPTIDE: B Natriuretic Peptide: 284.4 pg/mL — ABNORMAL HIGH (ref 0.0–100.0)

## 2021-01-12 NOTE — Addendum Note (Signed)
Encounter addended by: Jolaine Artist, MD on: 01/12/2021 10:56 PM  Actions taken: Level of Service modified, Visit diagnoses modified

## 2021-01-12 NOTE — Patient Instructions (Signed)
Labs done today, your results will be available in MyChart, we will contact you for abnormal readings.  Your provider has recommended that  you wear a Zio Patch for 7 days.  This monitor will record your heart rhythm for our review.  IF you have any symptoms while wearing the monitor please press the button.  If you have any issues with the patch or you notice a red or orange light on it please call the company at [phone].  Once you remove the patch please mail it back to the company as soon as possible so we can get the results.  Your physician recommends that you schedule a follow-up appointment in: 4 months  If you have any questions or concerns before your next appointment please send [place] a message through [place] or call our office at [phone].    TO LEAVE A MESSAGE FOR THE NURSE SELECT OPTION 2, PLEASE LEAVE A MESSAGE INCLUDING: . YOUR NAME . DATE OF BIRTH . CALL BACK NUMBER . REASON FOR CALL**this is important as we prioritize the call backs  [place] AS LONG AS YOU CALL BEFORE 4:00 PM  At the [facility], you and your health needs are our priority. As part of our continuing mission to provide you with exceptional heart care, we have created designated Provider Care Teams. These Care Teams include your primary Cardiologist (physician) and Advanced Practice Providers (APPs- Physician Assistants and Nurse Practitioners) who all work together to provide you with the care you need, when you need it.   You may see any of the following providers on your designated Care Team at your next follow up: Marland Kitchen Dr Glori Bickers . Dr Loralie Champagne . Dr Vickki Muff . Darrick Grinder, NP . Lyda Jester, [place] . Audry Riles, PharmD   Please be sure to bring in all your medications bottles to every appointment.

## 2021-01-12 NOTE — Progress Notes (Signed)
ReDS Vest / Clip - 01/12/21 0900      ReDS Vest / Clip   Station Marker A    Ruler Value 27    ReDS Value Range Low volume    ReDS Actual Value 29

## 2021-01-13 ENCOUNTER — Emergency Department (HOSPITAL_COMMUNITY)
Admission: EM | Admit: 2021-01-13 | Discharge: 2021-01-14 | Disposition: A | Discharge status: Home / Self Care | Payer: Medicare Other | Attending: Emergency Medicine | Admitting: Emergency Medicine

## 2021-01-13 ENCOUNTER — Encounter (HOSPITAL_COMMUNITY): Payer: Self-pay

## 2021-01-13 ENCOUNTER — Other Ambulatory Visit: Payer: Self-pay

## 2021-01-13 ENCOUNTER — Emergency Department (HOSPITAL_COMMUNITY): Payer: Medicare Other

## 2021-01-13 DIAGNOSIS — K5790 Diverticulosis of intestine, part unspecified, without perforation or abscess without bleeding: Secondary | ICD-10-CM | POA: Diagnosis not present

## 2021-01-13 DIAGNOSIS — Z20822 Contact with and (suspected) exposure to covid-19: Secondary | ICD-10-CM | POA: Insufficient documentation

## 2021-01-13 DIAGNOSIS — Z79899 Other long term (current) drug therapy: Secondary | ICD-10-CM | POA: Insufficient documentation

## 2021-01-13 DIAGNOSIS — Z955 Presence of coronary angioplasty implant and graft: Secondary | ICD-10-CM | POA: Diagnosis not present

## 2021-01-13 DIAGNOSIS — F17210 Nicotine dependence, cigarettes, uncomplicated: Secondary | ICD-10-CM | POA: Insufficient documentation

## 2021-01-13 DIAGNOSIS — E1122 Type 2 diabetes mellitus with diabetic chronic kidney disease: Secondary | ICD-10-CM | POA: Insufficient documentation

## 2021-01-13 DIAGNOSIS — R0602 Shortness of breath: Secondary | ICD-10-CM | POA: Diagnosis not present

## 2021-01-13 DIAGNOSIS — I5043 Acute on chronic combined systolic (congestive) and diastolic (congestive) heart failure: Secondary | ICD-10-CM | POA: Insufficient documentation

## 2021-01-13 DIAGNOSIS — J449 Chronic obstructive pulmonary disease, unspecified: Secondary | ICD-10-CM | POA: Diagnosis not present

## 2021-01-13 DIAGNOSIS — E785 Hyperlipidemia, unspecified: Secondary | ICD-10-CM | POA: Insufficient documentation

## 2021-01-13 DIAGNOSIS — Z7951 Long term (current) use of inhaled steroids: Secondary | ICD-10-CM | POA: Insufficient documentation

## 2021-01-13 DIAGNOSIS — E1169 Type 2 diabetes mellitus with other specified complication: Secondary | ICD-10-CM | POA: Diagnosis not present

## 2021-01-13 DIAGNOSIS — I700 Atherosclerosis of aorta: Secondary | ICD-10-CM | POA: Diagnosis not present

## 2021-01-13 DIAGNOSIS — Z7984 Long term (current) use of oral hypoglycemic drugs: Secondary | ICD-10-CM | POA: Insufficient documentation

## 2021-01-13 DIAGNOSIS — R1010 Upper abdominal pain, unspecified: Secondary | ICD-10-CM | POA: Diagnosis not present

## 2021-01-13 DIAGNOSIS — I25119 Atherosclerotic heart disease of native coronary artery with unspecified angina pectoris: Secondary | ICD-10-CM | POA: Insufficient documentation

## 2021-01-13 DIAGNOSIS — N3289 Other specified disorders of bladder: Secondary | ICD-10-CM | POA: Diagnosis not present

## 2021-01-13 DIAGNOSIS — Z7901 Long term (current) use of anticoagulants: Secondary | ICD-10-CM | POA: Insufficient documentation

## 2021-01-13 DIAGNOSIS — Z794 Long term (current) use of insulin: Secondary | ICD-10-CM | POA: Insufficient documentation

## 2021-01-13 DIAGNOSIS — Z8551 Personal history of malignant neoplasm of bladder: Secondary | ICD-10-CM | POA: Insufficient documentation

## 2021-01-13 DIAGNOSIS — Z9049 Acquired absence of other specified parts of digestive tract: Secondary | ICD-10-CM | POA: Diagnosis not present

## 2021-01-13 DIAGNOSIS — Z7902 Long term (current) use of antithrombotics/antiplatelets: Secondary | ICD-10-CM | POA: Insufficient documentation

## 2021-01-13 DIAGNOSIS — N182 Chronic kidney disease, stage 2 (mild): Secondary | ICD-10-CM | POA: Insufficient documentation

## 2021-01-13 LAB — BRAIN NATRIURETIC PEPTIDE: B Natriuretic Peptide: 371.1 pg/mL — ABNORMAL HIGH (ref 0.0–100.0)

## 2021-01-13 LAB — COMPREHENSIVE METABOLIC PANEL
ALT: 20 U/L (ref 0–44)
AST: 18 U/L (ref 15–41)
Albumin: 3.9 g/dL (ref 3.5–5.0)
Alkaline Phosphatase: 48 U/L (ref 38–126)
Anion gap: 10 (ref 5–15)
BUN: 38 mg/dL — ABNORMAL HIGH (ref 8–23)
CO2: 27 mmol/L (ref 22–32)
Calcium: 9.2 mg/dL (ref 8.9–10.3)
Chloride: 96 mmol/L — ABNORMAL LOW (ref 98–111)
Creatinine, Ser: 1.53 mg/dL — ABNORMAL HIGH (ref 0.44–1.00)
GFR, Estimated: 34 mL/min — ABNORMAL LOW (ref >60)
Glucose, Bld: 555 mg/dL (ref 70–99)
Potassium: 4.2 mmol/L (ref 3.5–5.1)
Sodium: 133 mmol/L — ABNORMAL LOW (ref 135–145)
Total Bilirubin: 0.8 mg/dL (ref 0.3–1.2)
Total Protein: 6.7 g/dL (ref 6.5–8.1)

## 2021-01-13 LAB — CBC WITH DIFFERENTIAL/PLATELET
Abs Immature Granulocytes: 0.09 10*3/uL — ABNORMAL HIGH (ref 0.00–0.07)
Basophils Absolute: 0.1 10*3/uL (ref 0.0–0.1)
Basophils Relative: 1 %
Eosinophils Absolute: 0.1 10*3/uL (ref 0.0–0.5)
Eosinophils Relative: 1 %
HCT: 34.1 % — ABNORMAL LOW (ref 36.0–46.0)
Hemoglobin: 11.1 g/dL — ABNORMAL LOW (ref 12.0–15.0)
Immature Granulocytes: 1 %
Lymphocytes Relative: 13 %
Lymphs Abs: 1.3 10*3/uL (ref 0.7–4.0)
MCH: 34.9 pg — ABNORMAL HIGH (ref 26.0–34.0)
MCHC: 32.6 g/dL (ref 30.0–36.0)
MCV: 107.2 fL — ABNORMAL HIGH (ref 80.0–100.0)
Monocytes Absolute: 0.6 10*3/uL (ref 0.1–1.0)
Monocytes Relative: 7 %
Neutro Abs: 7.4 10*3/uL (ref 1.7–7.7)
Neutrophils Relative %: 77 %
Platelets: 264 10*3/uL (ref 150–400)
RBC: 3.18 MIL/uL — ABNORMAL LOW (ref 3.87–5.11)
RDW: 19.9 % — ABNORMAL HIGH (ref 11.5–15.5)
WBC: 9.5 10*3/uL (ref 4.0–10.5)
nRBC: 0.3 % — ABNORMAL HIGH (ref 0.0–0.2)

## 2021-01-13 LAB — LIPASE, BLOOD: Lipase: 73 U/L — ABNORMAL HIGH (ref 11–51)

## 2021-01-13 LAB — TROPONIN I (HIGH SENSITIVITY): Troponin I (High Sensitivity): 10 ng/L (ref <18)

## 2021-01-13 MED ORDER — INSULIN ASPART 100 UNIT/ML ~~LOC~~ SOLN: 10.0000 [IU] | Freq: Once | SUBCUTANEOUS | Status: DC

## 2021-01-13 MED ORDER — FUROSEMIDE 10 MG/ML IJ SOLN
40.0000 mg | Freq: Once | INTRAMUSCULAR | Status: AC
  Administered 2021-01-14: 40 mg via INTRAVENOUS
  Filled 2021-01-13: qty 4

## 2021-01-13 MED ORDER — PANTOPRAZOLE SODIUM 40 MG IV SOLR
40.0000 mg | Freq: Once | INTRAVENOUS | Status: AC
  Administered 2021-01-14: 40 mg via INTRAVENOUS
  Filled 2021-01-13: qty 40

## 2021-01-13 MED ORDER — ALUM & MAG HYDROXIDE-SIMETH 200-200-20 MG/5ML PO SUSP
30.0000 mL | Freq: Once | ORAL | Status: AC
  Administered 2021-01-14: 30 mL via ORAL
  Filled 2021-01-13: qty 30

## 2021-01-13 MED ORDER — IPRATROPIUM-ALBUTEROL 0.5-2.5 (3) MG/3ML IN SOLN
3.0000 mL | Freq: Once | RESPIRATORY_TRACT | Status: AC
Start: 2021-01-14 — End: 2021-01-14
  Administered 2021-01-14: 3 mL via RESPIRATORY_TRACT
  Filled 2021-01-13: qty 3

## 2021-01-13 MED ORDER — MORPHINE SULFATE (PF) 4 MG/ML IV SOLN
4.0000 mg | Freq: Once | INTRAVENOUS | Status: DC
Start: 2021-01-14 — End: 2021-01-14
  Filled 2021-01-13: qty 1

## 2021-01-13 NOTE — ED Provider Notes (Incomplete)
[facility] EMERGENCY DEPARTMENT Provider Note   CSN: [id number] Arrival date & time: 01/13/21  2157   History Chief Complaint  Patient presents with  . Shortness of Breath    Hannah French Hannah HARVELL is a 80 y.o. female.  The history is provided by the patient.  Shortness of Breath She has history of diabetes, hyperlipidemia, coronary artery disease, combined systolic and diastolic heart failure, peripheral vascular disease, COPD anticoagulated on rivaroxaban and comes in because of upper abdominal pain which is now radiating to the back, and also shortness of breath.  She states that she started hurting across her upper abdomen yesterday and today, it started hurting into her back.  Pain is rated at 10/10.  Nothing makes it better, nothing makes it worse.  She denies nausea or vomiting.  He does have history of pancreatitis and is concerned that this may be recurrence.  Today, she also had some pain in the left side of her chest.  Also, she noted orthopnea tonight and noted that her heart rate was increased.  She has taken acetaminophen for pain without relief.  Past Medical History:  Diagnosis Date  . Abdominal bruit   . Bladder cancer ([place])   . CAD (coronary artery disease)    a. nonobstructive by cath 08/2018.  . Carotid bruit   . Chronic combined systolic and diastolic CHF (congestive heart failure) ([place])   . CKD (chronic kidney disease), stage II   . Congestive heart failure (CHF) ([place])   . Diabetes mellitus    Type II  . Dyslipidemia   . Heart murmur   . Mild pulmonary hypertension ([place])   . Mitral valve prolapse    a. not seen on most recent echoes. Mild MR now.  Marland Kitchen NICM (nonischemic cardiomyopathy) ([place])   . Osteoporosis   . Pancreatitis, acute   . Pseudogout   . PVD (peripheral vascular disease) ([place])    a. 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  . Sinus tachycardia   . Tobacco abuse     Patient  Active Problem List   Diagnosis Date Noted  . Skin abscess 01/15/2020  . Depression, recurrent ([place]) 08/08/2019  . Cigarette smoker 06/19/2019  . Vitamin D deficiency 01/29/2019  . Dizziness 12/03/2018  . Chronic systolic CHF (congestive heart failure), NYHA class 2 ([place]) 12/03/2018  . Coronary artery disease involving native coronary artery of native heart with angina pectoris ([place]) 10/04/2018  . Pain in right hand 10/02/2018  . Myalgia due to statin 03/29/2018  . COPD GOLD II / active smoker 10/26/2017  . Tobacco abuse 03/07/2016  . Type 2 diabetes mellitus ([place]) 08/26/2015  . Osteoporosis 09/02/2014  . Carotid stenosis 07/22/2012  . PVD 07/02/2009  . BLADDER CANCER 06/05/2009  . Hyperlipidemia associated with type 2 diabetes mellitus ([place]) 06/05/2009  . PSEUDOGOUT 06/05/2009  . MITRAL VALVE PROLAPSE 06/05/2009    Past Surgical History:  Procedure Laterality Date  . ABDOMINAL AORTOGRAM W/LOWER EXTREMITY N/A 12/31/2019   Procedure: ABDOMINAL AORTOGRAM W/LOWER EXTREMITY;  Surgeon: Wellington Hampshire, MD;  Location: [place] CV LAB;  Service: Cardiovascular;  Laterality: N/A;  . ABDOMINAL AORTOGRAM W/LOWER EXTREMITY N/A 09/01/2020   Procedure: ABDOMINAL AORTOGRAM W/LOWER EXTREMITY;  Surgeon: Wellington Hampshire, MD;  Location: [place] [facility];  Service: Cardiovascular;  Laterality: N/A;  . APPENDECTOMY    . CORONARY STENT INTERVENTION N/A 10/13/2020   Procedure: CORONARY STENT INTERVENTION;  Surgeon: Leonie Man, MD;  [facility]  INVASIVE CV LAB;  Service: Cardiovascular;  Laterality: N/A;  . Hysterectomy-type unspecified    . INTRAVASCULAR PRESSURE WIRE/FFR STUDY N/A 10/13/2020   Procedure: INTRAVASCULAR PRESSURE WIRE/FFR STUDY;  Surgeon: Leonie Man, MD;  Location: [place] CV LAB;  Service: Cardiovascular;  Laterality: N/A;  . LAPAROSCOPIC CHOLECYSTECTOMY  2009  . PERIPHERAL VASCULAR INTERVENTION  12/31/2019   Procedure: PERIPHERAL VASCULAR INTERVENTION;  Surgeon:  Wellington Hampshire, MD;  Location: [place] CV LAB;  Service: Cardiovascular;;  Bilateral [place]  . RIGHT/LEFT HEART CATH AND CORONARY ANGIOGRAPHY N/A 09/16/2018   Procedure: RIGHT/LEFT HEART CATH AND CORONARY ANGIOGRAPHY;  Surgeon: Sherren Mocha, MD;  Location: [place] CV LAB;  Service: Cardiovascular;  Laterality: N/A;  . RIGHT/LEFT HEART CATH AND CORONARY ANGIOGRAPHY N/A 10/13/2020   Procedure: RIGHT/LEFT HEART CATH AND CORONARY ANGIOGRAPHY;  Surgeon: Jolaine Artist, MD;  Location: [place] CV LAB;  Service: Cardiovascular;  Laterality: N/A;     OB History   No obstetric history on file.     Family History  Problem Relation Age of Onset  . Heart failure Mother   . Hypertension Sister   . Heart attack Brother   . Heart attack Maternal Uncle   . Heart disease Sister   . COPD Brother   . Congestive Heart Failure Brother   . Congestive Heart Failure Daughter   . Rheum arthritis Son   . Ovarian cancer Sister   . Coronary artery disease Neg Hx        Early    Social History   Tobacco Use  . Smoking status: Current Every Day Smoker    Packs/day: 1.00    Years: 50.00    Pack years: 50.00    Types: Cigarettes  . Smokeless tobacco: Never Used  Vaping Use  . Vaping Use: Never used  Substance Use Topics  . Alcohol use: No  . Drug use: No    Home Medications Prior to Admission medications   Medication Sig Start Date End Date Taking? Authorizing Provider  acetaminophen (TYLENOL) 500 MG tablet Take 500 mg by mouth every 6 (six) hours as needed for moderate pain.    [provider]  albuterol (VENTOLIN HFA) 108 (90 Base) MCG/ACT inhaler Inhale 2 puffs into the lungs every 6 (six) hours as needed for wheezing or shortness of breath. 06/24/20   Dettinger, Fransisca Kaufmann, MD  blood glucose meter kit and supplies Use up to four times daily as directed 04/16/19   Dettinger, Fransisca Kaufmann, MD  Cholecalciferol (VITAMIN D) 50 MCG (2000 UT) tablet Take 4,000 Units by mouth  daily.     [provider]  clopidogrel (PLAVIX) 75 MG tablet Take 1 tablet (75 mg total) by mouth daily. 10/13/20   Cheryln Manly, NP  cyclobenzaprine (FLEXERIL) 10 MG tablet TAKE 1 TABLET THREE TIMES DAILY FOR MUSCLE SPASM 07/21/20   Dettinger, Fransisca Kaufmann, MD  empagliflozin (JARDIANCE) 10 MG TABS tablet Take 1 tablet (10 mg total) by mouth daily before breakfast. 09/09/20   Dettinger, Fransisca Kaufmann, MD  Fluticasone-Salmeterol (ADVAIR) 100-50 MCG/DOSE AEPB Inhale 1 puff into the lungs 2 (two) times daily. 09/09/20   Dettinger, Fransisca Kaufmann, MD  furosemide (LASIX) 20 MG tablet Take 2 tablets daily. You may take an extra dose for increase weight gain orswelling 09/09/20   Dettinger, Fransisca Kaufmann, MD  glimepiride (AMARYL) 2 MG tablet Take 1-2 tablets (2-4 mg total) by mouth daily with breakfast. 09/09/20   Dettinger, Fransisca Kaufmann, MD  glucose blood [facility]  VERIO) test strip TEst BS BID and as needed Dx E11.9 03/30/20   Dettinger, Fransisca Kaufmann, MD  insulin degludec (TRESIBA FLEXTOUCH) 100 UNIT/ML FlexTouch Pen Inject 10 Units into the skin daily. 09/09/20   Dettinger, Fransisca Kaufmann, MD  Insulin Pen Needle (B-D UF III MINI PEN NEEDLES) 31G X 5 MM MISC 1 each by Does not apply route 4 (four) times daily. 11/20/19   Dettinger, Fransisca Kaufmann, MD  Lancets (ONETOUCH DELICA PLUS [id number]) [place] UP TO 4 TIMES DAILY AS DIRECTED Dx E11.9 06/21/20   Dettinger, Fransisca Kaufmann, MD  metoprolol succinate (TOPROL-XL) 25 MG 24 hr tablet Take 1 tablet (25 mg total) by mouth daily. 03/15/20   Bensimhon, Shaune Pascal, MD  Naphazoline-Pheniramine (OPCON-A) 0.027-0.315 % SOLN Place 1 drop into both eyes daily as needed (Burning and itching eyes).    [provider]  nitroGLYCERIN (NITRODUR - DOSED IN MG/24 HR) 0.2 mg/hr patch Place 1 patch (0.2 mg total) onto the skin daily. 11/02/20   Newt Minion, MD  nitroGLYCERIN (NITROSTAT) 0.3 MG SL tablet Place 1 tablet (0.3 mg total) under the tongue every 5 (five) minutes as needed for chest  pain. 10/08/20 10/08/21  Rafael Bihari, FNP  pentoxifylline (TRENTAL) 400 MG CR tablet Take 1 tablet (400 mg total) by mouth 3 (three) times daily with meals. 11/02/20   Newt Minion, MD  rivaroxaban (XARELTO) 2.5 MG TABS tablet Take 1 tablet (2.5 mg total) by mouth 2 (two) times daily. 09/08/20   Wellington Hampshire, MD  traMADol (ULTRAM) 50 MG tablet Take 1 tablet (50 mg total) by mouth every 6 (six) hours as needed for moderate pain. 11/09/20   Newt Minion, MD    Allergies    Contrast media [iodinated diagnostic agents], Iohexol, Metformin, Statins, Bextra [valdecoxib], Cefdinir, Cozaar [losartan potassium], Gabapentin, Nsaids, Zetia [ezetimibe], Rofecoxib, and Sulfa antibiotics  Review of Systems   Review of Systems  Respiratory: Positive for shortness of breath.   All other systems reviewed and are negative.   Physical Exam Updated Vital Signs There were no vitals taken for this visit.  Physical Exam Vitals and nursing note reviewed.   80 year old female, appears mildly dyspneic at rest, but is in no acute distress. Vital signs are ***. Oxygen saturation is ***%, which is normal. Head is normocephalic and atraumatic. PERRLA, EOMI. Oropharynx is clear. Neck is nontender and supple without adenopathy or JVD. Back is nontender and there is no CVA tenderness. Lungs have diminished airflow with scattered expiratory wheezes.  There are no rales or rhonchi. Chest is nontender. Heart has regular rate and rhythm without murmur. Abdomen is soft, flat, with moderate epigastric tenderness.  There is no rebound or guarding.  There are no masses or hepatosplenomegaly and peristalsis is hypoactive. Extremities have no cyanosis or edema, full range of motion is present. Skin is warm and dry without rash. Neurologic: Mental status is normal, cranial nerves are intact, moves all extremities equally.  ED Results / Procedures / Treatments   Labs (all labs ordered are listed, but only abnormal  results are displayed) Labs Reviewed  COMPREHENSIVE METABOLIC PANEL - Abnormal; Notable for the following components:      Result Value   Sodium 133 (*)    Chloride 96 (*)    Glucose, Bld 555 (*)    BUN 38 (*)    Creatinine, Ser 1.53 (*)    GFR, Estimated 34 (*)    All other components within normal limits  BRAIN NATRIURETIC PEPTIDE - Abnormal; Notable for the following components:   B Natriuretic Peptide 371.1 (*)    All other components within normal limits  CBC WITH DIFFERENTIAL/PLATELET - Abnormal; Notable for the following components:   RBC 3.18 (*)    Hemoglobin 11.1 (*)    HCT 34.1 (*)    MCV 107.2 (*)    MCH 34.9 (*)    RDW 19.9 (*)    nRBC 0.3 (*)    Abs Immature Granulocytes 0.09 (*)    All other components within normal limits  LIPASE, BLOOD - Abnormal; Notable for the following components:   Lipase 73 (*)    All other components within normal limits  SARS CORONAVIRUS 2 (TAT 6-24 HRS)  TROPONIN I (HIGH SENSITIVITY)  TROPONIN I (HIGH SENSITIVITY)   EKG None  Radiology DG Chest 2 View  Result Date: 01/13/2021 CLINICAL DATA:  Shortness of breath EXAM: CHEST - 2 VIEW COMPARISON:  03/10/2019 FINDINGS: Mild hyperinflation. Heart is upper limits normal in size. Aortic atherosclerosis. No confluent opacities or effusions. No acute bony abnormality. IMPRESSION: No active cardiopulmonary disease. Electronically Signed   By: Rolm Baptise M.D.   On: 01/13/2021 22:38    Procedures Procedures {Remember to document critical care time when appropriate:1}  Medications Ordered in ED Medications  insulin aspart (novoLOG) injection 10 Units (has no administration in time range)  furosemide (LASIX) injection 40 mg (has no administration in time range)  morphine 4 MG/ML injection 4 mg (has no administration in time range)  ipratropium-albuterol (DUONEB) 0.5-2.5 (3) MG/3ML nebulizer solution 3 mL (has no administration in time range)  alum & mag hydroxide-simeth (MAALOX/MYLANTA)  200-200-20 MG/5ML suspension 30 mL (has no administration in time range)  pantoprazole (PROTONIX) injection 40 mg (has no administration in time range)    ED Course  I have reviewed the triage vital signs and the nursing notes.  Pertinent labs & imaging results that were available during my care of the patient were reviewed by me and considered in my medical decision making (see chart for details).  MDM Rules/Calculators/A&P Epigastric pain, consider peptic ulcer disease, pancreatitis, diverticulitis.  Dyspnea which may be exacerbation of her underlying heart failure or COPD.  She does have wheezing and no obvious peripheral edema but will check BNP level.  Old records are reviewed confirming history of COPD, CHF, and also prior episode of pancreatitis.  WBC is normal.  Mild macrocytic anemia is present which is unchanged from baseline.  Final Clinical Impression(s) / ED Diagnoses Final diagnoses:  None    Rx / DC Orders ED Discharge Orders    None

## 2021-01-13 NOTE — ED Triage Notes (Signed)
Emergency Medicine Provider Triage Evaluation Note  Hannah French , a 80 y.o. female  was evaluated in triage.  Pt complains of chest pain.  She presents for evaluation of acute on chronic sob with back pain, chest pain.  Sxs worsened last night and now she feels like she cannot breathe.  Review of Systems  Positive: Chest pain, back pain, sob Negative: Fever, nausea, vomiting  Physical Exam  There were no vitals taken for this visit. Gen:   Awake, uncomfortable appearing HEENT:  Atraumatic  Resp:  Tachypnea, wheezes bilaterally Cardiac:  tachycardia Abd:   Nondistended, mild upper abdominal tenderness MSK:   Moves extremities without difficulty Neuro:  Speech clear   Medical Decision Making  Medically screening exam initiated at 10:20 PM.  Appropriate orders placed.  Hannah French was informed that the remainder of the evaluation will be completed by another provider, this initial triage assessment does not replace that evaluation, and the importance of remaining in the ED until their evaluation is complete.  Clinical Impression  SOB, needs further work up.     Quintella Reichert, MD 01/13/21 2223

## 2021-01-13 NOTE — ED Triage Notes (Signed)
Pt c/o shortness of breath started last night, worsens with laying down, has swelling in legs.  Pt c/o chest and back pain.

## 2021-01-13 NOTE — ED Provider Notes (Signed)
[facility] EMERGENCY DEPARTMENT Provider Note   CSN: [id number] Arrival date & time: 01/13/21  2157   History Chief Complaint  Patient presents with  . Shortness of Breath    Hannah French is a 80 y.o. female.  The history is provided by the patient.  Shortness of Breath She has history of diabetes, hyperlipidemia, coronary artery disease, combined systolic and diastolic heart failure, peripheral vascular disease, COPD anticoagulated on rivaroxaban and comes in because of upper abdominal pain which is now radiating to the back, and also shortness of breath.  She states that she started hurting across her upper abdomen yesterday and today, it started hurting into her back.  Pain is rated at 10/10.  Nothing makes it better, nothing makes it worse.  She denies nausea or vomiting.  He does have history of pancreatitis and is concerned that this may be recurrence.  Today, she also had some pain in the left side of her chest.  Also, she noted orthopnea tonight and noted that her heart rate was increased.  She has taken acetaminophen for pain without relief.  Past Medical History:  Diagnosis Date  . Abdominal bruit   . Bladder cancer ([name])   . CAD (coronary artery disease)    a. nonobstructive by cath 08/2018.  . Carotid bruit   . Chronic combined systolic and diastolic CHF (congestive heart failure) ([name])   . CKD (chronic kidney disease), stage II   . Congestive heart failure (CHF) ([place])   . Diabetes mellitus    Type II  . Dyslipidemia   . Heart murmur   . Mild pulmonary hypertension ([name])   . Mitral valve prolapse    a. not seen on most recent echoes. Mild MR now.  Marland Kitchen NICM (nonischemic cardiomyopathy) ([name])   . Osteoporosis   . Pancreatitis, acute   . Pseudogout   . PVD (peripheral vascular disease) ([name])    a. 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  . Sinus tachycardia   . Tobacco abuse     Patient  Active Problem List   Diagnosis Date Noted  . Skin abscess 01/15/2020  . Depression, recurrent ([name]) 08/08/2019  . Cigarette smoker 06/19/2019  . Vitamin D deficiency 01/29/2019  . Dizziness 12/03/2018  . Chronic systolic CHF (congestive heart failure), NYHA class 2 ([name]) 12/03/2018  . Coronary artery disease involving native coronary artery of native heart with angina pectoris ([name]) 10/04/2018  . Pain in right hand 10/02/2018  . Myalgia due to statin 03/29/2018  . COPD GOLD II / active smoker 10/26/2017  . Tobacco abuse 03/07/2016  . Type 2 diabetes mellitus ([name]) 08/26/2015  . Osteoporosis 09/02/2014  . Carotid stenosis 07/22/2012  . PVD 07/02/2009  . BLADDER CANCER 06/05/2009  . Hyperlipidemia associated with type 2 diabetes mellitus ([name]) 06/05/2009  . PSEUDOGOUT 06/05/2009  . MITRAL VALVE PROLAPSE 06/05/2009    Past Surgical History:  Procedure Laterality Date  . ABDOMINAL AORTOGRAM W/LOWER EXTREMITY N/A 12/31/2019   Procedure: ABDOMINAL AORTOGRAM W/LOWER EXTREMITY;  Surgeon: Wellington Hampshire, MD;  Location: [place] [facility];  Service: Cardiovascular;  Laterality: N/A;  . ABDOMINAL AORTOGRAM W/LOWER EXTREMITY N/A 09/01/2020   Procedure: ABDOMINAL AORTOGRAM W/LOWER EXTREMITY;  Surgeon: Wellington Hampshire, MD;  Location: [place] [facility];  Service: Cardiovascular;  Laterality: N/A;  . APPENDECTOMY    . CORONARY STENT INTERVENTION N/A 10/13/2020   Procedure: CORONARY STENT INTERVENTION;  Surgeon: Leonie Man, MD;  [facility]  INVASIVE CV LAB;  Service: Cardiovascular;  Laterality: N/A;  . Hysterectomy-type unspecified    . INTRAVASCULAR PRESSURE WIRE/FFR STUDY N/A 10/13/2020   Procedure: INTRAVASCULAR PRESSURE WIRE/FFR STUDY;  Surgeon: Leonie Man, MD;  Location: [place] [facility];  Service: Cardiovascular;  Laterality: N/A;  . LAPAROSCOPIC CHOLECYSTECTOMY  2009  . PERIPHERAL VASCULAR INTERVENTION  12/31/2019   Procedure: PERIPHERAL VASCULAR INTERVENTION;  Surgeon:  Wellington Hampshire, MD;  Location: [place] [facility];  Service: Cardiovascular;;  Bilateral [place]  . RIGHT/LEFT HEART CATH AND CORONARY ANGIOGRAPHY N/A 09/16/2018   Procedure: RIGHT/LEFT HEART CATH AND CORONARY ANGIOGRAPHY;  Surgeon: Sherren Mocha, MD;  Location: [place] [facility];  Service: Cardiovascular;  Laterality: N/A;  . RIGHT/LEFT HEART CATH AND CORONARY ANGIOGRAPHY N/A 10/13/2020   Procedure: RIGHT/LEFT HEART CATH AND CORONARY ANGIOGRAPHY;  Surgeon: Jolaine Artist, MD;  Location: [place] [facility];  Service: Cardiovascular;  Laterality: N/A;     OB History   No obstetric history on file.     Family History  Problem Relation Age of Onset  . Heart failure Mother   . Hypertension Sister   . Heart attack Brother   . Heart attack Maternal Uncle   . Heart disease Sister   . COPD Brother   . Congestive Heart Failure Brother   . Congestive Heart Failure Daughter   . Rheum arthritis Son   . Ovarian cancer Sister   . Coronary artery disease Neg Hx        Early    Social History   Tobacco Use  . Smoking status: Current Every Day Smoker    Packs/day: 1.00    Years: 50.00    Pack years: 50.00    Types: Cigarettes  . Smokeless tobacco: Never Used  Vaping Use  . Vaping Use: Never used  Substance Use Topics  . Alcohol use: No  . Drug use: No    Home Medications Prior to Admission medications   Medication Sig Start Date End Date Taking? Authorizing Provider  acetaminophen (TYLENOL) 500 MG tablet Take 500 mg by mouth every 6 (six) hours as needed for moderate pain.    [provider]  albuterol (VENTOLIN HFA) 108 (90 Base) MCG/ACT inhaler Inhale 2 puffs into the lungs every 6 (six) hours as needed for wheezing or shortness of breath. 06/24/20   Dettinger, Fransisca Kaufmann, MD  blood glucose meter kit and supplies Use up to four times daily as directed 04/16/19   Dettinger, Fransisca Kaufmann, MD  Cholecalciferol (VITAMIN D) 50 MCG (2000 UT) tablet Take 4,000 Units by mouth  daily.     [provider]  clopidogrel (PLAVIX) 75 MG tablet Take 1 tablet (75 mg total) by mouth daily. 10/13/20   Cheryln Manly, NP  cyclobenzaprine (FLEXERIL) 10 MG tablet TAKE 1 TABLET THREE TIMES DAILY FOR MUSCLE SPASM 07/21/20   Dettinger, Fransisca Kaufmann, MD  empagliflozin (JARDIANCE) 10 MG TABS tablet Take 1 tablet (10 mg total) by mouth daily before breakfast. 09/09/20   Dettinger, Fransisca Kaufmann, MD  Fluticasone-Salmeterol (ADVAIR) 100-50 MCG/DOSE AEPB Inhale 1 puff into the lungs 2 (two) times daily. 09/09/20   Dettinger, Fransisca Kaufmann, MD  furosemide (LASIX) 20 MG tablet Take 2 tablets daily. You may take an extra dose for increase weight gain orswelling 09/09/20   Dettinger, Fransisca Kaufmann, MD  glimepiride (AMARYL) 2 MG tablet Take 1-2 tablets (2-4 mg total) by mouth daily with breakfast. 09/09/20   Dettinger, Fransisca Kaufmann, MD  glucose blood [facility]  VERIO) test strip TEst BS BID and as needed Dx E11.9 03/30/20   Dettinger, Fransisca Kaufmann, MD  insulin degludec (TRESIBA FLEXTOUCH) 100 UNIT/ML FlexTouch Pen Inject 10 Units into the skin daily. 09/09/20   Dettinger, Fransisca Kaufmann, MD  Insulin Pen Needle (B-D UF III MINI PEN NEEDLES) 31G X 5 MM MISC 1 each by Does not apply route 4 (four) times daily. 11/20/19   Dettinger, Fransisca Kaufmann, MD  Lancets (ONETOUCH DELICA PLUS [id number]) [place] UP TO 4 TIMES DAILY AS DIRECTED Dx E11.9 06/21/20   Dettinger, Fransisca Kaufmann, MD  metoprolol succinate (TOPROL-XL) 25 MG 24 hr tablet Take 1 tablet (25 mg total) by mouth daily. 03/15/20   Bensimhon, Shaune Pascal, MD  Naphazoline-Pheniramine (OPCON-A) 0.027-0.315 % SOLN Place 1 drop into both eyes daily as needed (Burning and itching eyes).    [provider]  nitroGLYCERIN (NITRODUR - DOSED IN MG/24 HR) 0.2 mg/hr patch Place 1 patch (0.2 mg total) onto the skin daily. 11/02/20   Newt Minion, MD  nitroGLYCERIN (NITROSTAT) 0.3 MG SL tablet Place 1 tablet (0.3 mg total) under the tongue every 5 (five) minutes as needed for chest  pain. 10/08/20 10/08/21  Rafael Bihari, FNP  pentoxifylline (TRENTAL) 400 MG CR tablet Take 1 tablet (400 mg total) by mouth 3 (three) times daily with meals. 11/02/20   Newt Minion, MD  rivaroxaban (XARELTO) 2.5 MG TABS tablet Take 1 tablet (2.5 mg total) by mouth 2 (two) times daily. 09/08/20   Wellington Hampshire, MD  traMADol (ULTRAM) 50 MG tablet Take 1 tablet (50 mg total) by mouth every 6 (six) hours as needed for moderate pain. 11/09/20   Newt Minion, MD    Allergies    Contrast media [iodinated diagnostic agents], Iohexol, Metformin, Statins, Bextra [valdecoxib], Cefdinir, Cozaar [losartan potassium], Gabapentin, Nsaids, Zetia [ezetimibe], Rofecoxib, and Sulfa antibiotics  Review of Systems   Review of Systems  Respiratory: Positive for shortness of breath.   All other systems reviewed and are negative.   Physical Exam Updated Vital Signs BP 92/61   Pulse 94   Temp 97.8 F (36.6 C) (Oral)   Resp (!) 31   SpO2 100%   Physical Exam Vitals and nursing note reviewed.   80 year old female, appears mildly dyspneic at rest, but is in no acute distress. Vital signs are significant for elevated respiratory rate. Oxygen saturation is 100%, which is normal. Head is normocephalic and atraumatic. PERRLA, EOMI. Oropharynx is clear. Neck is nontender and supple without adenopathy or JVD. Back is nontender and there is no CVA tenderness. Lungs have diminished airflow with scattered expiratory wheezes.  There are no rales or rhonchi. Chest is nontender. Heart has regular rate and rhythm without murmur. Abdomen is soft, flat, with moderate epigastric tenderness.  There is no rebound or guarding.  There are no masses or hepatosplenomegaly and peristalsis is hypoactive. Extremities have no cyanosis or edema, full range of motion is present. Skin is warm and dry without rash. Neurologic: Mental status is normal, cranial nerves are intact, moves all extremities equally.  ED Results /  Procedures / Treatments   Labs (all labs ordered are listed, but only abnormal results are displayed) Labs Reviewed  COMPREHENSIVE METABOLIC PANEL - Abnormal; Notable for the following components:      Result Value   Sodium 133 (*)    Chloride 96 (*)    Glucose, Bld 555 (*)    BUN 38 (*)  Creatinine, Ser 1.53 (*)    GFR, Estimated 34 (*)    All other components within normal limits  BRAIN NATRIURETIC PEPTIDE - Abnormal; Notable for the following components:   B Natriuretic Peptide 371.1 (*)    All other components within normal limits  CBC WITH DIFFERENTIAL/PLATELET - Abnormal; Notable for the following components:   RBC 3.18 (*)    Hemoglobin 11.1 (*)    HCT 34.1 (*)    MCV 107.2 (*)    MCH 34.9 (*)    RDW 19.9 (*)    nRBC 0.3 (*)    Abs Immature Granulocytes 0.09 (*)    All other components within normal limits  LIPASE, BLOOD - Abnormal; Notable for the following components:   Lipase 73 (*)    All other components within normal limits  CBG MONITORING, ED - Abnormal; Notable for the following components:   Glucose-Capillary 186 (*)    All other components within normal limits  SARS CORONAVIRUS 2 (TAT 6-24 HRS)  RESP PANEL BY RT-PCR (FLU A&B, COVID) [id number]  TROPONIN I (HIGH SENSITIVITY)  TROPONIN I (HIGH SENSITIVITY)   EKG JEZEL, BASTO [id number] 14-Jan-2021 00:37:33 [facility]/ED ROUTINE RECORD Aug 20, 1941 (40 yr) Female Caucasian [id number] Loc:11 Technician: [phone] Test ind: Vent. rate 81 BPM PR interval 142 ms QRS duration 141 ms QT/QTcB 405/471 ms P-R-T axes 81 -68 88 Sinus rhythm Left bundle branch block When compared with ECG of 01/12/2021, No significant change was found Confirmed by Delora Fuel ([id number]) on 01/14/2021 1:07:22 AM Confirmed By: Delora Fuel  Radiology DG Chest 2 View  Result Date: 01/13/2021 CLINICAL DATA:  Shortness of breath EXAM: CHEST - 2 VIEW COMPARISON:  03/10/2019 FINDINGS: Mild hyperinflation. Heart  is upper limits normal in size. Aortic atherosclerosis. No confluent opacities or effusions. No acute bony abnormality. IMPRESSION: No active cardiopulmonary disease. Electronically Signed   By: Rolm Baptise M.D.   On: 01/13/2021 22:38    Procedures Procedures   Medications Ordered in ED Medications  insulin aspart (novoLOG) injection 10 Units (has no administration in time range)  furosemide (LASIX) injection 40 mg (has no administration in time range)  morphine 4 MG/ML injection 4 mg (has no administration in time range)  ipratropium-albuterol (DUONEB) 0.5-2.5 (3) MG/3ML nebulizer solution 3 mL (has no administration in time range)  alum & mag hydroxide-simeth (MAALOX/MYLANTA) 200-200-20 MG/5ML suspension 30 mL (has no administration in time range)  pantoprazole (PROTONIX) injection 40 mg (has no administration in time range)    ED Course  I have reviewed the triage vital signs and the nursing notes.  Pertinent labs & imaging results that were available during my care of the patient were reviewed by me and considered in my medical decision making (see chart for details).  MDM Rules/Calculators/A&P Epigastric pain, consider peptic ulcer disease, pancreatitis, diverticulitis.  Dyspnea which may be exacerbation of her underlying heart failure or COPD.  She does have wheezing and no obvious peripheral edema but will check BNP level.  Old records are reviewed confirming history of COPD, CHF, and also prior episode of pancreatitis.  WBC is normal.  Mild macrocytic anemia is present which is unchanged from baseline.  Glucose is significantly elevated at 555, and she will be given IV insulin.  Creatinine has increased from yesterday, but is still in a range which patient has been in previously.  Lipase is mildly elevated.  Will send for CT of abdomen and pelvis to look for signs of pancreatitis.  BNP is also  significantly elevated compared with yesterday, she is given a dose of IV furosemide.  She  is given morphine for pain.  ECG shows left bundle branch block, unchanged from prior.  CT scan shows no evidence of pancreatitis.  Following IV furosemide, she did have good diuresis and breathing improved.  On reexam, wheezing stopped following diuresis.  COVID screen has come back negative and she was given a nebulizer treatment.  She was able to ambulate without any oxygen desaturation.  She is discharged with instructions to take her furosemide daily for the next 3 days and follow-up with her PCP in 1 week.  Final Clinical Impression(s) / ED Diagnoses Final diagnoses:  Acute on chronic combined systolic and diastolic congestive heart failure ([place])  Chronic obstructive pulmonary disease, unspecified COPD type [facility])    Rx / DC Orders ED Discharge Orders    None       Delora Fuel, MD 11/73/56 [phone]

## 2021-01-14 ENCOUNTER — Emergency Department (HOSPITAL_COMMUNITY): Payer: Medicare Other

## 2021-01-14 DIAGNOSIS — N3289 Other specified disorders of bladder: Secondary | ICD-10-CM | POA: Diagnosis not present

## 2021-01-14 DIAGNOSIS — Z9049 Acquired absence of other specified parts of digestive tract: Secondary | ICD-10-CM | POA: Diagnosis not present

## 2021-01-14 DIAGNOSIS — I700 Atherosclerosis of aorta: Secondary | ICD-10-CM | POA: Diagnosis not present

## 2021-01-14 DIAGNOSIS — K5790 Diverticulosis of intestine, part unspecified, without perforation or abscess without bleeding: Secondary | ICD-10-CM | POA: Diagnosis not present

## 2021-01-14 LAB — RESP PANEL BY RT-PCR (FLU A&B, COVID) ARPGX2
Influenza A by PCR: NEGATIVE
Influenza B by PCR: NEGATIVE
SARS Coronavirus 2 by RT PCR: NEGATIVE

## 2021-01-14 LAB — TROPONIN I (HIGH SENSITIVITY): Troponin I (High Sensitivity): 12 ng/L (ref <18)

## 2021-01-14 LAB — CBG MONITORING, ED: Glucose-Capillary: 186 mg/dL — ABNORMAL HIGH (ref 70–99)

## 2021-01-14 LAB — SARS CORONAVIRUS 2 (TAT 6-24 HRS): SARS Coronavirus 2: NEGATIVE

## 2021-01-14 MED ORDER — ONDANSETRON HCL 4 MG/2ML IJ SOLN
4.0000 mg | Freq: Once | INTRAMUSCULAR | Status: AC
  Administered 2021-01-14: 4 mg via INTRAVENOUS
  Filled 2021-01-14: qty 2

## 2021-01-14 MED ORDER — TRAMADOL HCL 50 MG PO TABS
50.0000 mg | ORAL_TABLET | Freq: Once | ORAL | Status: AC
  Administered 2021-01-14: 50 mg via ORAL
  Filled 2021-01-14: qty 1

## 2021-01-14 MED ORDER — IPRATROPIUM-ALBUTEROL 0.5-2.5 (3) MG/3ML IN SOLN
3.0000 mL | Freq: Once | RESPIRATORY_TRACT | Status: AC
  Administered 2021-01-14: 3 mL via RESPIRATORY_TRACT
  Filled 2021-01-14: qty 3

## 2021-01-14 MED ORDER — BARIUM SULFATE 2.1 % PO SUSP
ORAL | Status: AC
  Filled 2021-01-14: qty 2

## 2021-01-14 NOTE — ED Notes (Signed)
Pt states she was given insulin a couple hours ago. CBG rechecked with improvement

## 2021-01-14 NOTE — Discharge Instructions (Addendum)
Please take your furosemide every day for the next three days.  Continue to use your albuterol inhaler as needed.  Return if your breathing is getting worse.

## 2021-01-16 ENCOUNTER — Encounter (HOSPITAL_COMMUNITY): Payer: Self-pay

## 2021-01-16 ENCOUNTER — Emergency Department (HOSPITAL_COMMUNITY): Payer: Medicare Other

## 2021-01-16 ENCOUNTER — Other Ambulatory Visit: Payer: Self-pay

## 2021-01-16 ENCOUNTER — Inpatient Hospital Stay (HOSPITAL_COMMUNITY)
Admission: EM | Admit: 2021-01-16 | Discharge: 2021-01-19 | DRG: 291 | Disposition: A | Discharge status: Home / Self Care | Payer: Medicare Other | Attending: Family Medicine | Admitting: Family Medicine

## 2021-01-16 DIAGNOSIS — Z8041 Family history of malignant neoplasm of ovary: Secondary | ICD-10-CM

## 2021-01-16 DIAGNOSIS — R6889 Other general symptoms and signs: Secondary | ICD-10-CM | POA: Diagnosis not present

## 2021-01-16 DIAGNOSIS — I272 Pulmonary hypertension, unspecified: Secondary | ICD-10-CM | POA: Diagnosis present

## 2021-01-16 DIAGNOSIS — Z7902 Long term (current) use of antithrombotics/antiplatelets: Secondary | ICD-10-CM

## 2021-01-16 DIAGNOSIS — F17210 Nicotine dependence, cigarettes, uncomplicated: Secondary | ICD-10-CM | POA: Diagnosis not present

## 2021-01-16 DIAGNOSIS — T380X5A Adverse effect of glucocorticoids and synthetic analogues, initial encounter: Secondary | ICD-10-CM | POA: Diagnosis not present

## 2021-01-16 DIAGNOSIS — Z7901 Long term (current) use of anticoagulants: Secondary | ICD-10-CM

## 2021-01-16 DIAGNOSIS — G8929 Other chronic pain: Secondary | ICD-10-CM | POA: Diagnosis present

## 2021-01-16 DIAGNOSIS — R7989 Other specified abnormal findings of blood chemistry: Secondary | ICD-10-CM | POA: Diagnosis present

## 2021-01-16 DIAGNOSIS — Z20822 Contact with and (suspected) exposure to covid-19: Secondary | ICD-10-CM | POA: Diagnosis present

## 2021-01-16 DIAGNOSIS — D72829 Elevated white blood cell count, unspecified: Secondary | ICD-10-CM

## 2021-01-16 DIAGNOSIS — M549 Dorsalgia, unspecified: Secondary | ICD-10-CM | POA: Diagnosis present

## 2021-01-16 DIAGNOSIS — I25119 Atherosclerotic heart disease of native coronary artery with unspecified angina pectoris: Secondary | ICD-10-CM | POA: Diagnosis present

## 2021-01-16 DIAGNOSIS — E1122 Type 2 diabetes mellitus with diabetic chronic kidney disease: Secondary | ICD-10-CM | POA: Diagnosis not present

## 2021-01-16 DIAGNOSIS — J441 Chronic obstructive pulmonary disease with (acute) exacerbation: Secondary | ICD-10-CM | POA: Diagnosis present

## 2021-01-16 DIAGNOSIS — N183 Chronic kidney disease, stage 3 unspecified: Secondary | ICD-10-CM

## 2021-01-16 DIAGNOSIS — Z7951 Long term (current) use of inhaled steroids: Secondary | ICD-10-CM | POA: Diagnosis not present

## 2021-01-16 DIAGNOSIS — Z8551 Personal history of malignant neoplasm of bladder: Secondary | ICD-10-CM | POA: Diagnosis not present

## 2021-01-16 DIAGNOSIS — E785 Hyperlipidemia, unspecified: Secondary | ICD-10-CM | POA: Diagnosis not present

## 2021-01-16 DIAGNOSIS — R002 Palpitations: Secondary | ICD-10-CM | POA: Diagnosis present

## 2021-01-16 DIAGNOSIS — Z7984 Long term (current) use of oral hypoglycemic drugs: Secondary | ICD-10-CM

## 2021-01-16 DIAGNOSIS — J449 Chronic obstructive pulmonary disease, unspecified: Secondary | ICD-10-CM

## 2021-01-16 DIAGNOSIS — R718 Other abnormality of red blood cells: Secondary | ICD-10-CM | POA: Diagnosis not present

## 2021-01-16 DIAGNOSIS — Y92239 Unspecified place in hospital as the place of occurrence of the external cause: Secondary | ICD-10-CM | POA: Diagnosis not present

## 2021-01-16 DIAGNOSIS — Z8249 Family history of ischemic heart disease and other diseases of the circulatory system: Secondary | ICD-10-CM

## 2021-01-16 DIAGNOSIS — D631 Anemia in chronic kidney disease: Secondary | ICD-10-CM | POA: Diagnosis present

## 2021-01-16 DIAGNOSIS — I5042 Chronic combined systolic (congestive) and diastolic (congestive) heart failure: Secondary | ICD-10-CM | POA: Diagnosis not present

## 2021-01-16 DIAGNOSIS — Z79899 Other long term (current) drug therapy: Secondary | ICD-10-CM

## 2021-01-16 DIAGNOSIS — I4510 Unspecified right bundle-branch block: Secondary | ICD-10-CM | POA: Diagnosis not present

## 2021-01-16 DIAGNOSIS — Z955 Presence of coronary angioplasty implant and graft: Secondary | ICD-10-CM | POA: Diagnosis not present

## 2021-01-16 DIAGNOSIS — N1832 Chronic kidney disease, stage 3b: Secondary | ICD-10-CM | POA: Diagnosis present

## 2021-01-16 DIAGNOSIS — I5043 Acute on chronic combined systolic (congestive) and diastolic (congestive) heart failure: Secondary | ICD-10-CM | POA: Diagnosis not present

## 2021-01-16 DIAGNOSIS — Z794 Long term (current) use of insulin: Secondary | ICD-10-CM

## 2021-01-16 DIAGNOSIS — Z743 Need for continuous supervision: Secondary | ICD-10-CM | POA: Diagnosis not present

## 2021-01-16 DIAGNOSIS — E782 Mixed hyperlipidemia: Secondary | ICD-10-CM | POA: Diagnosis not present

## 2021-01-16 DIAGNOSIS — I2510 Atherosclerotic heart disease of native coronary artery without angina pectoris: Secondary | ICD-10-CM | POA: Diagnosis present

## 2021-01-16 DIAGNOSIS — E1151 Type 2 diabetes mellitus with diabetic peripheral angiopathy without gangrene: Secondary | ICD-10-CM | POA: Diagnosis present

## 2021-01-16 DIAGNOSIS — I499 Cardiac arrhythmia, unspecified: Secondary | ICD-10-CM | POA: Diagnosis not present

## 2021-01-16 DIAGNOSIS — R0600 Dyspnea, unspecified: Secondary | ICD-10-CM

## 2021-01-16 DIAGNOSIS — E1165 Type 2 diabetes mellitus with hyperglycemia: Secondary | ICD-10-CM | POA: Diagnosis not present

## 2021-01-16 DIAGNOSIS — J9601 Acute respiratory failure with hypoxia: Secondary | ICD-10-CM | POA: Diagnosis present

## 2021-01-16 DIAGNOSIS — R069 Unspecified abnormalities of breathing: Secondary | ICD-10-CM | POA: Diagnosis not present

## 2021-01-16 DIAGNOSIS — Z825 Family history of asthma and other chronic lower respiratory diseases: Secondary | ICD-10-CM | POA: Diagnosis not present

## 2021-01-16 DIAGNOSIS — R0602 Shortness of breath: Secondary | ICD-10-CM | POA: Diagnosis not present

## 2021-01-16 LAB — CBC WITH DIFFERENTIAL/PLATELET
Abs Immature Granulocytes: 0.05 10*3/uL (ref 0.00–0.07)
Basophils Absolute: 0.1 10*3/uL (ref 0.0–0.1)
Basophils Relative: 1 %
Eosinophils Absolute: 0.1 10*3/uL (ref 0.0–0.5)
Eosinophils Relative: 1 %
HCT: 35.7 % — ABNORMAL LOW (ref 36.0–46.0)
Hemoglobin: 11.6 g/dL — ABNORMAL LOW (ref 12.0–15.0)
Immature Granulocytes: 1 %
Lymphocytes Relative: 16 %
Lymphs Abs: 1.8 10*3/uL (ref 0.7–4.0)
MCH: 34.6 pg — ABNORMAL HIGH (ref 26.0–34.0)
MCHC: 32.5 g/dL (ref 30.0–36.0)
MCV: 106.6 fL — ABNORMAL HIGH (ref 80.0–100.0)
Monocytes Absolute: 0.9 10*3/uL (ref 0.1–1.0)
Monocytes Relative: 8 %
Neutro Abs: 8.1 10*3/uL — ABNORMAL HIGH (ref 1.7–7.7)
Neutrophils Relative %: 73 %
Platelets: 225 10*3/uL (ref 150–400)
RBC: 3.35 MIL/uL — ABNORMAL LOW (ref 3.87–5.11)
RDW: 19.9 % — ABNORMAL HIGH (ref 11.5–15.5)
WBC: 11 10*3/uL — ABNORMAL HIGH (ref 4.0–10.5)
nRBC: 0.3 % — ABNORMAL HIGH (ref 0.0–0.2)

## 2021-01-16 LAB — BASIC METABOLIC PANEL
Anion gap: 11 (ref 5–15)
BUN: 50 mg/dL — ABNORMAL HIGH (ref 8–23)
CO2: 27 mmol/L (ref 22–32)
Calcium: 9.3 mg/dL (ref 8.9–10.3)
Chloride: 97 mmol/L — ABNORMAL LOW (ref 98–111)
Creatinine, Ser: 1.7 mg/dL — ABNORMAL HIGH (ref 0.44–1.00)
GFR, Estimated: 30 mL/min — ABNORMAL LOW (ref >60)
Glucose, Bld: 282 mg/dL — ABNORMAL HIGH (ref 70–99)
Potassium: 4.3 mmol/L (ref 3.5–5.1)
Sodium: 135 mmol/L (ref 135–145)

## 2021-01-16 LAB — TROPONIN I (HIGH SENSITIVITY): Troponin I (High Sensitivity): 12 ng/L (ref <18)

## 2021-01-16 LAB — BRAIN NATRIURETIC PEPTIDE: B Natriuretic Peptide: 303 pg/mL — ABNORMAL HIGH (ref 0.0–100.0)

## 2021-01-16 MED ORDER — IPRATROPIUM BROMIDE 0.02 % IN SOLN
RESPIRATORY_TRACT | Status: AC
  Filled 2021-01-16: qty 2.5

## 2021-01-16 MED ORDER — ALBUTEROL SULFATE (2.5 MG/3ML) 0.083% IN NEBU
INHALATION_SOLUTION | RESPIRATORY_TRACT | Status: AC
  Filled 2021-01-16: qty 6

## 2021-01-16 NOTE — ED Provider Notes (Signed)
[facility] Provider Note  CSN: [id number] Arrival date & time: 01/16/21 2251    History Chief Complaint  Patient presents with  . Respiratory Distress    HPI  Hannah French is a 80 y.o. female with history of CAD, combined diastolic and systolic CHF, COPD presents for evaluation of increasing SOB since earlier today. She was in the CHF clinic 3 days ago and fitted with a ZIO patch due to reports of palpitations. She was subsequently seen in the [facility] 2 days ago for upper abdominal pain, CP and SOB. She had elevated BNP and was given lasix with helped her SOB and she was ultimately discharged home. She reports some occasional dry cough and rib pain. Denies any fevers or substernal chest pain. She does not wear home oxygen and was noted to be hypoxic with SpO2 88% on RA with EMS. She has been using her albuterol inhaler during the day and was given one albuterol neb enroute. She is feeling better now. Declined solumedrol because she feels like it makes her heart race more. She is currently on Xarelto and Plavix for her CAD.    Past Medical History:  Diagnosis Date  . Abdominal bruit   . Bladder cancer ([place])   . CAD (coronary artery disease)    a. nonobstructive by cath 08/2018.  . Carotid bruit   . Chronic combined systolic and diastolic CHF (congestive heart failure) ([place])   . CKD (chronic kidney disease), stage II   . Congestive heart failure (CHF) ([place])   . Diabetes mellitus    Type II  . Dyslipidemia   . Heart murmur   . Mild pulmonary hypertension ([place])   . Mitral valve prolapse    a. not seen on most recent echoes. Mild MR now.  Marland Kitchen NICM (nonischemic cardiomyopathy) ([place])   . Osteoporosis   . Pancreatitis, acute   . Pseudogout   . PVD (peripheral vascular disease) ([place])    a. 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  . Sinus tachycardia   . Tobacco abuse     Past Surgical History:  Procedure  Laterality Date  . ABDOMINAL AORTOGRAM W/LOWER EXTREMITY N/A 12/31/2019   Procedure: ABDOMINAL AORTOGRAM W/LOWER EXTREMITY;  Surgeon: Wellington Hampshire, MD;  Location: [facility];  Service: Cardiovascular;  Laterality: N/A;  . ABDOMINAL AORTOGRAM W/LOWER EXTREMITY N/A 09/01/2020   Procedure: ABDOMINAL AORTOGRAM W/LOWER EXTREMITY;  Surgeon: Wellington Hampshire, MD;  Location: [facility];  Service: Cardiovascular;  Laterality: N/A;  . APPENDECTOMY    . CORONARY STENT INTERVENTION N/A 10/13/2020   Procedure: CORONARY STENT INTERVENTION;  Surgeon: Leonie Man, MD;  Location: [facility];  Service: Cardiovascular;  Laterality: N/A;  . Hysterectomy-type unspecified    . INTRAVASCULAR PRESSURE WIRE/FFR STUDY N/A 10/13/2020   Procedure: INTRAVASCULAR PRESSURE WIRE/FFR STUDY;  Surgeon: Leonie Man, MD;  Location: [facility];  Service: Cardiovascular;  Laterality: N/A;  . LAPAROSCOPIC CHOLECYSTECTOMY  2009  . PERIPHERAL VASCULAR INTERVENTION  12/31/2019   Procedure: PERIPHERAL VASCULAR INTERVENTION;  Surgeon: Wellington Hampshire, MD;  Location: [facility];  Service: Cardiovascular;;  Bilateral [place]  . RIGHT/LEFT HEART CATH AND CORONARY ANGIOGRAPHY N/A 09/16/2018   Procedure: RIGHT/LEFT HEART CATH AND CORONARY ANGIOGRAPHY;  Surgeon: Sherren Mocha, MD;  Location: [facility];  Service: Cardiovascular;  Laterality: N/A;  . RIGHT/LEFT HEART CATH AND CORONARY ANGIOGRAPHY N/A 10/13/2020   Procedure: RIGHT/LEFT HEART  CATH AND CORONARY ANGIOGRAPHY;  Surgeon: Jolaine Artist, MD;  Location: [facility];  Service: Cardiovascular;  Laterality: N/A;    Family History  Problem Relation Age of Onset  . Heart failure Mother   . Hypertension Sister   . Heart attack Brother   . Heart attack Maternal Uncle   . Heart disease Sister   . COPD Brother   . Congestive Heart Failure Brother   . Congestive Heart Failure Daughter   . Rheum arthritis Son   .  Ovarian cancer Sister   . Coronary artery disease Neg Hx        Early    Social History   Tobacco Use  . Smoking status: Current Every Day Smoker    Packs/day: 1.00    Years: 50.00    Pack years: 50.00    Types: Cigarettes  . Smokeless tobacco: Never Used  Vaping Use  . Vaping Use: Never used  Substance Use Topics  . Alcohol use: No  . Drug use: No     Home Medications Prior to Admission medications   Medication Sig Start Date End Date Taking? Authorizing Provider  acetaminophen (TYLENOL) 500 MG tablet Take 500 mg by mouth every 6 (six) hours as needed for moderate pain.    [provider]  albuterol (VENTOLIN HFA) 108 (90 Base) MCG/ACT inhaler Inhale 2 puffs into the lungs every 6 (six) hours as needed for wheezing or shortness of breath. 06/24/20   Dettinger, Fransisca Kaufmann, MD  blood glucose meter kit and supplies Use up to four times daily as directed 04/16/19   Dettinger, Fransisca Kaufmann, MD  Cholecalciferol (VITAMIN D) 50 MCG (2000 UT) tablet Take 4,000 Units by mouth daily.     [provider]  clopidogrel (PLAVIX) 75 MG tablet Take 1 tablet (75 mg total) by mouth daily. 10/13/20   Cheryln Manly, NP  cyclobenzaprine (FLEXERIL) 10 MG tablet TAKE 1 TABLET THREE TIMES DAILY FOR MUSCLE SPASM 07/21/20   Dettinger, Fransisca Kaufmann, MD  empagliflozin (JARDIANCE) 10 MG TABS tablet Take 1 tablet (10 mg total) by mouth daily before breakfast. 09/09/20   Dettinger, Fransisca Kaufmann, MD  Fluticasone-Salmeterol (ADVAIR) 100-50 MCG/DOSE AEPB Inhale 1 puff into the lungs 2 (two) times daily. 09/09/20   Dettinger, Fransisca Kaufmann, MD  furosemide (LASIX) 20 MG tablet Take 2 tablets daily. You may take an extra dose for increase weight gain orswelling 09/09/20   Dettinger, Fransisca Kaufmann, MD  glimepiride (AMARYL) 2 MG tablet Take 1-2 tablets (2-4 mg total) by mouth daily with breakfast. 09/09/20   Dettinger, Fransisca Kaufmann, MD  glucose blood (ONETOUCH VERIO) test strip TEst BS BID and as needed Dx E11.9 03/30/20    Dettinger, Fransisca Kaufmann, MD  insulin degludec (TRESIBA FLEXTOUCH) 100 UNIT/ML FlexTouch Pen Inject 10 Units into the skin daily. 09/09/20   Dettinger, Fransisca Kaufmann, MD  Insulin Pen Needle (B-D UF III MINI PEN NEEDLES) 31G X 5 MM MISC 1 each by Does not apply route 4 (four) times daily. 11/20/19   Dettinger, Fransisca Kaufmann, MD  Lancets (ONETOUCH DELICA PLUS ZOXWRU04V) [place] UP TO 4 TIMES DAILY AS DIRECTED Dx E11.9 06/21/20   Dettinger, Fransisca Kaufmann, MD  metoprolol succinate (TOPROL-XL) 25 MG 24 hr tablet Take 1 tablet (25 mg total) by mouth daily. 03/15/20   Bensimhon, Shaune Pascal, MD  Naphazoline-Pheniramine (OPCON-A) 0.027-0.315 % SOLN Place 1 drop into both eyes daily as needed (Burning and itching eyes).    [provider]  nitroGLYCERIN (NITRODUR - DOSED IN MG/24 HR) 0.2 mg/hr patch Place 1 patch (0.2 mg total) onto the skin daily. 11/02/20   Newt Minion, MD  nitroGLYCERIN (NITROSTAT) 0.3 MG SL tablet Place 1 tablet (0.3 mg total) under the tongue every 5 (five) minutes as needed for chest pain. 10/08/20 10/08/21  Rafael Bihari, FNP  pentoxifylline (TRENTAL) 400 MG CR tablet Take 1 tablet (400 mg total) by mouth 3 (three) times daily with meals. 11/02/20   Newt Minion, MD  rivaroxaban (XARELTO) 2.5 MG TABS tablet Take 1 tablet (2.5 mg total) by mouth 2 (two) times daily. 09/08/20   Wellington Hampshire, MD  traMADol (ULTRAM) 50 MG tablet Take 1 tablet (50 mg total) by mouth every 6 (six) hours as needed for moderate pain. 11/09/20   Newt Minion, MD     Allergies    Contrast media [iodinated diagnostic agents], Iohexol, Metformin, Statins, Bextra [valdecoxib], Cefdinir, Cozaar [losartan potassium], Gabapentin, Nsaids, Zetia [ezetimibe], Rofecoxib, and Sulfa antibiotics   Review of Systems   Review of Systems A comprehensive review of systems was completed and negative except as noted in HPI.    Physical Exam BP 123/73   Pulse 81   Temp 98 F (36.7 C)   Resp 20   Ht '5\' 3"'  (1.6 m)    Wt 51 kg   SpO2 94%   BMI 19.92 kg/m   Physical Exam Vitals and nursing note reviewed.  Constitutional:      Appearance: Normal appearance.  HENT:     Head: Normocephalic and atraumatic.     Nose: Nose normal.     Mouth/Throat:     Mouth: Mucous membranes are moist.  Eyes:     Extraocular Movements: Extraocular movements intact.     Conjunctiva/sclera: Conjunctivae normal.  Cardiovascular:     Rate and Rhythm: Normal rate.  Pulmonary:     Effort: No respiratory distress.     Breath sounds: Rales present. No wheezing.     Comments: Mild increased WOB Abdominal:     General: Abdomen is flat.     Palpations: Abdomen is soft.     Tenderness: There is no abdominal tenderness.  Musculoskeletal:        General: No swelling. Normal range of motion.     Cervical back: Neck supple.  Skin:    General: Skin is warm and dry.  Neurological:     General: No focal deficit present.     Mental Status: She is alert.  Psychiatric:        Mood and Affect: Mood normal.      ED Results / Procedures / Treatments   Labs (all labs ordered are listed, but only abnormal results are displayed) Labs Reviewed  BASIC METABOLIC PANEL - Abnormal; Notable for the following components:      Result Value   Chloride 97 (*)    Glucose, Bld 282 (*)    BUN 50 (*)    Creatinine, Ser 1.70 (*)    GFR, Estimated 30 (*)    All other components within normal limits  BRAIN NATRIURETIC PEPTIDE - Abnormal; Notable for the following components:   B Natriuretic Peptide 303.0 (*)    All other components within normal limits  CBC WITH DIFFERENTIAL/PLATELET - Abnormal; Notable for the following components:   WBC 11.0 (*)    RBC 3.35 (*)    Hemoglobin 11.6 (*)    HCT 35.7 (*)    MCV 106.6 (*)  MCH 34.6 (*)    RDW 19.9 (*)    nRBC 0.3 (*)    Neutro Abs 8.1 (*)    All other components within normal limits  RESP PANEL BY RT-PCR (FLU A&B, COVID) ARPGX2  FOLATE  VITAMIN B12  COMPREHENSIVE METABOLIC PANEL   CBC  PROTIME-INR  APTT  MAGNESIUM  PHOSPHORUS  TROPONIN I (HIGH SENSITIVITY)  TROPONIN I (HIGH SENSITIVITY)    EKG None  Radiology DG Chest Port 1 View  Result Date: 01/16/2021 CLINICAL DATA:  Shortness of breath EXAM: PORTABLE CHEST 1 VIEW COMPARISON:  January 13, 2021 FINDINGS: The heart size and mediastinal contours are within normal limits. Chronic parenchymal lung changes with mild hyperinflation. Loop recorder device overlies the left chest. No focal consolidation. No pleural effusion. No pneumothorax. The visualized skeletal structures are unchanged. IMPRESSION: No acute cardiopulmonary process. Electronically Signed   By: Dahlia Bailiff MD   On: 01/16/2021 23:28    Procedures Procedures  Medications Ordered in the ED Medications  ipratropium-albuterol (DUONEB) 0.5-2.5 (3) MG/3ML nebulizer solution 3 mL (3 mLs Nebulization Not Given 01/17/21 0406)  ipratropium-albuterol (DUONEB) 0.5-2.5 (3) MG/3ML nebulizer solution 3 mL (has no administration in time range)  dextromethorphan-guaiFENesin (MUCINEX DM) 30-600 MG per 12 hr tablet 1 tablet (1 tablet Oral Given 01/17/21 0309)  azithromycin (ZITHROMAX) tablet 500 mg (has no administration in time range)    Followed by  azithromycin (ZITHROMAX) tablet 250 mg (has no administration in time range)  methylPREDNISolone sodium succinate (SOLU-MEDROL) 40 mg/mL injection 20 mg (has no administration in time range)  pantoprazole (PROTONIX) EC tablet 40 mg (has no administration in time range)  metoprolol succinate (TOPROL-XL) 24 hr tablet 25 mg (has no administration in time range)  nitroGLYCERIN (NITROSTAT) SL tablet 0.3 mg (has no administration in time range)  clopidogrel (PLAVIX) tablet 75 mg (has no administration in time range)  rivaroxaban (XARELTO) tablet 2.5 mg (has no administration in time range)  mometasone-formoterol (DULERA) 100-5 MCG/ACT inhaler 2 puff (has no administration in time range)  cyclobenzaprine (FLEXERIL) tablet  10 mg (has no administration in time range)  acetaminophen (TYLENOL) tablet 500 mg (has no administration in time range)  ipratropium (ATROVENT) 0.02 % nebulizer solution (  Given 01/16/21 2304)  albuterol (PROVENTIL) (2.5 MG/3ML) 0.083% nebulizer solution (  Given 01/16/21 2304)  methylPREDNISolone sodium succinate (SOLU-MEDROL) 125 mg/2 mL injection 125 mg (125 mg Intravenous Given 01/17/21 0030)     MDM Rules/Calculators/A&P MDM Patient with multifactorial SOB, will check labs and CXR to compare to recent [facility] visit and continue to monitor in the ED.  ED Course  I have reviewed the triage vital signs and the nursing notes.  Pertinent labs & imaging results that were available during my care of the patient were reviewed by me and considered in my medical decision making (see chart for details).  Clinical Course as of 01/17/21 0529  Sun Jan 16, 2021  2319 CBC with mild leukocytosis and anemia. Not significantly changed from previous.  [CS]  2342 BMP with mild worsening in Cr from previous, Glucose is improved. BNP is lower. Trop is neg.  [CS]  6073 CXR without signs of PNA or Pulm Edema. Suspect her symptoms are mostly from COPD, although she has multiple other conditions which may be contributing. Currently getting neb, will reassess after.  [CS]  Mon Jan 17, 2021  0017 Patient resting comfortably now, but has had to have oxygen increased to 6L. She was reluctant to take solumedrol, not because of  her HR, but because of her blood sugar. She just finished a course of oral prednisone a few days ago. Will give a dose of solumedrol and discuss with hospitalist.  [CS]  0045 Spoke with Dr. Josephine Cables, who will evaluate for admission.  [CS]    Clinical Course User Index [CS] Truddie Hidden, MD    Final Clinical Impression(s) / ED Diagnoses Final diagnoses:  COPD exacerbation [facility])    Rx / DC Orders ED Discharge Orders    None       Truddie Hidden, MD 01/17/21 [phone]

## 2021-01-16 NOTE — ED Triage Notes (Signed)
Pt brought in by EMS in Respiratory Distress. Per EMS, pt was SOB upon their arrival with productive cough and O2 sats in 80's. Pt placed on 100% NRB en-route and duoneb started. Pt had an episode of hypotension en route and EMS upgraded pt's status to Emergency Traffic. Pt's hypotension resolved prior to arrival. Pt placed on O2 @ 2 L but eventually increased to 4L [place] where pt could maintain sats >90%. Dr's Gilford Raid and Karle Starch at bedside.

## 2021-01-16 NOTE — ED Notes (Signed)
Oxygen sats 88-89% on 4lnc, oxygen increased to 6lnc, Dr. Karle Starch notified.

## 2021-01-17 ENCOUNTER — Encounter (HOSPITAL_COMMUNITY): Payer: Medicare Other

## 2021-01-17 ENCOUNTER — Observation Stay (HOSPITAL_COMMUNITY): Payer: Medicare Other

## 2021-01-17 ENCOUNTER — Encounter (HOSPITAL_COMMUNITY): Payer: Self-pay | Admitting: Internal Medicine

## 2021-01-17 DIAGNOSIS — I25119 Atherosclerotic heart disease of native coronary artery with unspecified angina pectoris: Secondary | ICD-10-CM

## 2021-01-17 DIAGNOSIS — E782 Mixed hyperlipidemia: Secondary | ICD-10-CM

## 2021-01-17 DIAGNOSIS — R718 Other abnormality of red blood cells: Secondary | ICD-10-CM | POA: Diagnosis not present

## 2021-01-17 DIAGNOSIS — J9601 Acute respiratory failure with hypoxia: Secondary | ICD-10-CM | POA: Diagnosis not present

## 2021-01-17 DIAGNOSIS — E1165 Type 2 diabetes mellitus with hyperglycemia: Secondary | ICD-10-CM

## 2021-01-17 DIAGNOSIS — J441 Chronic obstructive pulmonary disease with (acute) exacerbation: Secondary | ICD-10-CM | POA: Diagnosis not present

## 2021-01-17 DIAGNOSIS — N183 Chronic kidney disease, stage 3 unspecified: Secondary | ICD-10-CM

## 2021-01-17 DIAGNOSIS — R0602 Shortness of breath: Secondary | ICD-10-CM | POA: Diagnosis not present

## 2021-01-17 DIAGNOSIS — I5042 Chronic combined systolic (congestive) and diastolic (congestive) heart failure: Secondary | ICD-10-CM | POA: Diagnosis not present

## 2021-01-17 DIAGNOSIS — D72829 Elevated white blood cell count, unspecified: Secondary | ICD-10-CM | POA: Diagnosis not present

## 2021-01-17 DIAGNOSIS — N1832 Chronic kidney disease, stage 3b: Secondary | ICD-10-CM

## 2021-01-17 LAB — COMPREHENSIVE METABOLIC PANEL
ALT: 28 U/L (ref 0–44)
AST: 27 U/L (ref 15–41)
Albumin: 3.8 g/dL (ref 3.5–5.0)
Alkaline Phosphatase: 41 U/L (ref 38–126)
Anion gap: 12 (ref 5–15)
BUN: 45 mg/dL — ABNORMAL HIGH (ref 8–23)
CO2: 28 mmol/L (ref 22–32)
Calcium: 9.2 mg/dL (ref 8.9–10.3)
Chloride: 98 mmol/L (ref 98–111)
Creatinine, Ser: 1.49 mg/dL — ABNORMAL HIGH (ref 0.44–1.00)
GFR, Estimated: 35 mL/min — ABNORMAL LOW (ref >60)
Glucose, Bld: 229 mg/dL — ABNORMAL HIGH (ref 70–99)
Potassium: 4.1 mmol/L (ref 3.5–5.1)
Sodium: 138 mmol/L (ref 135–145)
Total Bilirubin: 1.5 mg/dL — ABNORMAL HIGH (ref 0.3–1.2)
Total Protein: 6.2 g/dL — ABNORMAL LOW (ref 6.5–8.1)

## 2021-01-17 LAB — HEMOGLOBIN A1C
Hgb A1c MFr Bld: 9.8 % — ABNORMAL HIGH (ref 4.8–5.6)
Mean Plasma Glucose: 234.56 mg/dL

## 2021-01-17 LAB — GLUCOSE, CAPILLARY
Glucose-Capillary: 450 mg/dL — ABNORMAL HIGH (ref 70–99)
Glucose-Capillary: 362 mg/dL — ABNORMAL HIGH (ref 70–99)
Glucose-Capillary: 322 mg/dL — ABNORMAL HIGH (ref 70–99)
Glucose-Capillary: 324 mg/dL — ABNORMAL HIGH (ref 70–99)

## 2021-01-17 LAB — CBC
HCT: 32.5 % — ABNORMAL LOW (ref 36.0–46.0)
Hemoglobin: 10.6 g/dL — ABNORMAL LOW (ref 12.0–15.0)
MCH: 35.2 pg — ABNORMAL HIGH (ref 26.0–34.0)
MCHC: 32.6 g/dL (ref 30.0–36.0)
MCV: 108 fL — ABNORMAL HIGH (ref 80.0–100.0)
Platelets: 208 10*3/uL (ref 150–400)
RBC: 3.01 MIL/uL — ABNORMAL LOW (ref 3.87–5.11)
RDW: 19.5 % — ABNORMAL HIGH (ref 11.5–15.5)
WBC: 9.2 10*3/uL (ref 4.0–10.5)
nRBC: 0 % (ref 0.0–0.2)

## 2021-01-17 LAB — MAGNESIUM: Magnesium: 2.4 mg/dL (ref 1.7–2.4)

## 2021-01-17 LAB — TROPONIN I (HIGH SENSITIVITY): Troponin I (High Sensitivity): 16 ng/L (ref <18)

## 2021-01-17 LAB — RESP PANEL BY RT-PCR (FLU A&B, COVID) ARPGX2
Influenza A by PCR: NEGATIVE
Influenza B by PCR: NEGATIVE
SARS Coronavirus 2 by RT PCR: NEGATIVE

## 2021-01-17 LAB — PROTIME-INR
INR: 1.2 (ref 0.8–1.2)
Prothrombin Time: 15.5 seconds — ABNORMAL HIGH (ref 11.4–15.2)

## 2021-01-17 LAB — APTT: aPTT: 25 seconds (ref 24–36)

## 2021-01-17 LAB — VITAMIN B12: Vitamin B-12: 565 pg/mL (ref 180–914)

## 2021-01-17 LAB — PHOSPHORUS: Phosphorus: 3.5 mg/dL (ref 2.5–4.6)

## 2021-01-17 LAB — FOLATE: Folate: 19.2 ng/mL (ref >5.9)

## 2021-01-17 MED ORDER — TECHNETIUM TO 99M ALBUMIN AGGREGATED
4.0000 | Freq: Once | INTRAVENOUS | Status: AC | PRN
  Administered 2021-01-17: 3.97 via INTRAVENOUS

## 2021-01-17 MED ORDER — PANTOPRAZOLE SODIUM 40 MG PO TBEC
40.0000 mg | DELAYED_RELEASE_TABLET | Freq: Every day | ORAL | Status: DC
  Administered 2021-01-17 – 2021-01-19 (×3): 40 mg via ORAL
  Filled 2021-01-17 (×3): qty 1

## 2021-01-17 MED ORDER — IPRATROPIUM-ALBUTEROL 0.5-2.5 (3) MG/3ML IN SOLN: 3.0000 mL | RESPIRATORY_TRACT | Status: DC | PRN

## 2021-01-17 MED ORDER — INSULIN ASPART 100 UNIT/ML ~~LOC~~ SOLN
0.0000 [IU] | Freq: Every day | SUBCUTANEOUS | Status: DC
  Administered 2021-01-17: 4 [IU] via SUBCUTANEOUS
  Administered 2021-01-18: 5 [IU] via SUBCUTANEOUS

## 2021-01-17 MED ORDER — MOMETASONE FURO-FORMOTEROL FUM 100-5 MCG/ACT IN AERO
2.0000 | INHALATION_SPRAY | Freq: Two times a day (BID) | RESPIRATORY_TRACT | Status: DC
  Administered 2021-01-17 – 2021-01-19 (×5): 2 via RESPIRATORY_TRACT
  Filled 2021-01-17: qty 8.8

## 2021-01-17 MED ORDER — RIVAROXABAN 2.5 MG PO TABS
2.5000 mg | ORAL_TABLET | Freq: Two times a day (BID) | ORAL | Status: DC
  Administered 2021-01-17 – 2021-01-19 (×5): 2.5 mg via ORAL
  Filled 2021-01-17 (×9): qty 1

## 2021-01-17 MED ORDER — INSULIN GLARGINE 100 UNIT/ML ~~LOC~~ SOLN
10.0000 [IU] | Freq: Every day | SUBCUTANEOUS | Status: DC
  Administered 2021-01-17 – 2021-01-18 (×2): 10 [IU] via SUBCUTANEOUS
  Filled 2021-01-17 (×3): qty 0.1

## 2021-01-17 MED ORDER — METHYLPREDNISOLONE SODIUM SUCC 40 MG IJ SOLR
20.0000 mg | Freq: Two times a day (BID) | INTRAMUSCULAR | Status: DC
  Administered 2021-01-17 – 2021-01-18 (×3): 20 mg via INTRAVENOUS
  Filled 2021-01-17 (×3): qty 1

## 2021-01-17 MED ORDER — ACETAMINOPHEN 500 MG PO TABS
500.0000 mg | ORAL_TABLET | Freq: Four times a day (QID) | ORAL | Status: DC | PRN
  Administered 2021-01-19: 500 mg via ORAL
  Filled 2021-01-17: qty 1

## 2021-01-17 MED ORDER — AZITHROMYCIN 250 MG PO TABS
250.0000 mg | ORAL_TABLET | Freq: Every day | ORAL | Status: DC
  Administered 2021-01-18 – 2021-01-19 (×2): 250 mg via ORAL
  Filled 2021-01-17 (×2): qty 1

## 2021-01-17 MED ORDER — DM-GUAIFENESIN ER 30-600 MG PO TB12
1.0000 | ORAL_TABLET | Freq: Two times a day (BID) | ORAL | Status: DC
  Administered 2021-01-17 – 2021-01-19 (×6): 1 via ORAL
  Filled 2021-01-17 (×6): qty 1

## 2021-01-17 MED ORDER — IPRATROPIUM-ALBUTEROL 0.5-2.5 (3) MG/3ML IN SOLN
3.0000 mL | Freq: Four times a day (QID) | RESPIRATORY_TRACT | Status: DC
  Administered 2021-01-17 (×3): 3 mL via RESPIRATORY_TRACT
  Filled 2021-01-17 (×4): qty 3

## 2021-01-17 MED ORDER — IPRATROPIUM-ALBUTEROL 0.5-2.5 (3) MG/3ML IN SOLN
3.0000 mL | Freq: Three times a day (TID) | RESPIRATORY_TRACT | Status: DC
  Administered 2021-01-18 – 2021-01-19 (×5): 3 mL via RESPIRATORY_TRACT
  Filled 2021-01-17 (×5): qty 3

## 2021-01-17 MED ORDER — AZITHROMYCIN 250 MG PO TABS
500.0000 mg | ORAL_TABLET | Freq: Every day | ORAL | Status: AC
  Administered 2021-01-17: 500 mg via ORAL
  Filled 2021-01-17: qty 2

## 2021-01-17 MED ORDER — INSULIN ASPART 100 UNIT/ML ~~LOC~~ SOLN
0.0000 [IU] | Freq: Three times a day (TID) | SUBCUTANEOUS | Status: DC
  Administered 2021-01-17: 6 [IU] via SUBCUTANEOUS
  Administered 2021-01-17: 4 [IU] via SUBCUTANEOUS
  Administered 2021-01-18: 3 [IU] via SUBCUTANEOUS
  Administered 2021-01-18: 4 [IU] via SUBCUTANEOUS
  Administered 2021-01-18: 1 [IU] via SUBCUTANEOUS
  Administered 2021-01-19: 10 [IU] via SUBCUTANEOUS
  Administered 2021-01-19: 2 [IU] via SUBCUTANEOUS

## 2021-01-17 MED ORDER — METOPROLOL SUCCINATE ER 25 MG PO TB24
25.0000 mg | ORAL_TABLET | Freq: Every day | ORAL | Status: DC
  Administered 2021-01-17 – 2021-01-18 (×2): 25 mg via ORAL
  Filled 2021-01-17 (×3): qty 1

## 2021-01-17 MED ORDER — CLOPIDOGREL BISULFATE 75 MG PO TABS
75.0000 mg | ORAL_TABLET | Freq: Every day | ORAL | Status: DC
  Administered 2021-01-17 – 2021-01-19 (×3): 75 mg via ORAL
  Filled 2021-01-17 (×3): qty 1

## 2021-01-17 MED ORDER — CYCLOBENZAPRINE HCL 10 MG PO TABS: 10.0000 mg | ORAL_TABLET | Freq: Three times a day (TID) | ORAL | Status: DC | PRN

## 2021-01-17 MED ORDER — NITROGLYCERIN 0.3 MG SL SUBL
0.3000 mg | SUBLINGUAL_TABLET | SUBLINGUAL | Status: DC | PRN
  Filled 2021-01-17: qty 100

## 2021-01-17 MED ORDER — METHYLPREDNISOLONE SODIUM SUCC 125 MG IJ SOLR
125.0000 mg | Freq: Once | INTRAMUSCULAR | Status: AC
  Administered 2021-01-17: 125 mg via INTRAVENOUS
  Filled 2021-01-17: qty 2

## 2021-01-17 NOTE — Progress Notes (Signed)
Patient seen and evaluated, chart reviewed, please see EMR for updated orders. Please see full H&P dictated by admitting physician Dr. Josephine Cables for same date of service.    Brief Summary:- 80 y.o. female with medical history significant for T2DM, hyperlipidemia, coronary artery disease, combined systolic and diastolic heart failure, peripheral vascular disease and COPD on rivaroxaban 2.5 mg  for CAD/PAD prevention presents with worsening shortness of breath and hypoxic respiratory failure  A/p  1) acute hypoxic respiratory failure--multifactorial suspect mostly due to combined systolic and diastolic dysfunction CHF, some component of COPD as well -V/Q low probability for PE -Chest x-ray without pneumonia -COVID-negative -EF from echo of 10/21/2020 8 4045%, there was grade 1 diastolic dysfunction and hypokinesis of the left ventricle -  2)DM2-Lantus 10 units daily Use Novolog/Humalog Sliding scale insulin with Accu-Cheks/Fingersticks as ordered    3)CKD 3B--appears stable at this time, monitor renal function Closely with diuresis  4)CAD/PAD--status post LAD stent in January 2022, history of 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 Haroldine Laws placed a Zio patch  -Total care time is 47 minutes  Patient seen and evaluated, chart reviewed, please see EMR for updated orders. Please see full H&P dictated by admitting physician Dr. Josephine Cables for same date of service.

## 2021-01-17 NOTE — H&P (Signed)
History and Physical  Hannah French SPQ:[id number] DOB: 07/20/1941 DOA: 01/16/2021  Referring physician: Truddie Hidden, MD PCP: Dettinger, Fransisca Kaufmann, MD  Patient coming from: Home  Chief Complaint: Shortness of breath  HPI: Hannah French is a 80 y.o. female with medical history significant for T2DM,  hyperlipidemia, coronary artery disease, combined systolic and diastolic heart failure, peripheral vascular disease and COPD on rivaroxaban presents to the emergency department due to 2-day onset of shortness of breath.  She complained of shortness of breath which usually wakes her up from sleep (sleeps on a recliner due to chronic back pain), she also complained of cough with intermittent production of clear mucus.  She states that she has been using home albuterol without improvement.  She presented to [facility] ED on 4/21 due to abdominal pain and she was diagnosed to have acute on chronic combined systolic diastolic CHF as well as COPD, she was diuresed, breathing treatment was given and patient was discharged home with instructions to take her Lasix for the next 3 days and follow-up with her PCP in 1 week.  She was seen at CHF clinic on 4/20 and a Zio patch placed due to palpitations.  She denies fever, chills, nausea, vomiting or abdominal pain  ED Course:  In the emergency department, she was hemodynamically stable.  Work-up in the ED showed mild leukocytosis, macrocytic anemia, BUN to creatinine 50/1.70, baseline creatinine 1.3-1.5), hyperglycemia, BNP 303 (this was 371 on 4/21).  Troponin x1-12. Chest x-ray showed no acute cardiopulmonary process Solu-Medrol 25 Mg x1 was given.  Hospitalist was asked to admit him for further evaluation and management.  Review of Systems: Constitutional: Negative for chills and fever.  HENT: Negative for ear pain and sore throat.   Eyes: Negative for pain and visual disturbance.  Respiratory: Positive for shortness of breath and  cough Cardiovascular: Negative for chest pain and palpitations.  Gastrointestinal: Negative for abdominal pain and vomiting.  Endocrine: Negative for polyphagia and polyuria.  Genitourinary: Negative for decreased urine volume, dysuria, enuresis Musculoskeletal: Negative for arthralgias and back pain.  Skin: Negative for color change and rash.  Allergic/Immunologic: Negative for immunocompromised state.  Neurological: Negative for tremors, syncope, speech difficulty, weakness, light-headedness and headaches.  Hematological: Does not bruise/bleed easily.  All other systems reviewed and are negative   Past Medical History:  Diagnosis Date  . Abdominal bruit   . Bladder cancer ([place])   . CAD (coronary artery disease)    a. nonobstructive by cath 08/2018.  . Carotid bruit   . Chronic combined systolic and diastolic CHF (congestive heart failure) ([place])   . CKD (chronic kidney disease), stage II   . Congestive heart failure (CHF) ([place])   . Diabetes mellitus    Type II  . Dyslipidemia   . Heart murmur   . Mild pulmonary hypertension ([place])   . Mitral valve prolapse    a. not seen on most recent echoes. Mild MR now.  Marland Kitchen NICM (nonischemic cardiomyopathy) ([place])   . Osteoporosis   . Pancreatitis, acute   . Pseudogout   . PVD (peripheral vascular disease) ([place])    a. 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  . Sinus tachycardia   . Tobacco abuse    Past Surgical History:  Procedure Laterality Date  . ABDOMINAL AORTOGRAM W/LOWER EXTREMITY N/A 12/31/2019   Procedure: ABDOMINAL AORTOGRAM W/LOWER EXTREMITY;  Surgeon: Wellington Hampshire, MD;  Location: [facility];  Service: Cardiovascular;  Laterality: N/A;  . ABDOMINAL AORTOGRAM W/LOWER EXTREMITY N/A 09/01/2020   Procedure: ABDOMINAL AORTOGRAM W/LOWER EXTREMITY;  Surgeon: Wellington Hampshire, MD;  Location: [facility];  Service: Cardiovascular;  Laterality: N/A;  . APPENDECTOMY    .  CORONARY STENT INTERVENTION N/A 10/13/2020   Procedure: CORONARY STENT INTERVENTION;  Surgeon: Leonie Man, MD;  Location: [facility];  Service: Cardiovascular;  Laterality: N/A;  . Hysterectomy-type unspecified    . INTRAVASCULAR PRESSURE WIRE/FFR STUDY N/A 10/13/2020   Procedure: INTRAVASCULAR PRESSURE WIRE/FFR STUDY;  Surgeon: Leonie Man, MD;  Location: [facility];  Service: Cardiovascular;  Laterality: N/A;  . LAPAROSCOPIC CHOLECYSTECTOMY  2009  . PERIPHERAL VASCULAR INTERVENTION  12/31/2019   Procedure: PERIPHERAL VASCULAR INTERVENTION;  Surgeon: Wellington Hampshire, MD;  Location: [facility];  Service: Cardiovascular;;  Bilateral [place]  . RIGHT/LEFT HEART CATH AND CORONARY ANGIOGRAPHY N/A 09/16/2018   Procedure: RIGHT/LEFT HEART CATH AND CORONARY ANGIOGRAPHY;  Surgeon: Sherren Mocha, MD;  Location: [facility];  Service: Cardiovascular;  Laterality: N/A;  . RIGHT/LEFT HEART CATH AND CORONARY ANGIOGRAPHY N/A 10/13/2020   Procedure: RIGHT/LEFT HEART CATH AND CORONARY ANGIOGRAPHY;  Surgeon: Jolaine Artist, MD;  Location: [facility];  Service: Cardiovascular;  Laterality: N/A;    Social History:  reports that she has been smoking cigarettes. She has a 50.00 pack-year smoking history. She has never used smokeless tobacco. She reports that she does not drink alcohol and does not use drugs.   Allergies  Allergen Reactions  . Contrast Media [Iodinated Diagnostic Agents] Shortness Of Breath  . Iohexol Other (See Comments)    PASSED OUT DURING THE TEST    . Metformin Swelling    Face and hands became swollen  . Statins Swelling and Other (See Comments)    They make the patient hurt "all over" Myalgia, also  . Bextra [Valdecoxib] Nausea And Vomiting and Swelling    Shut down my kidneys   . Cefdinir Other (See Comments)    Pancreatitis   . Cozaar [Losartan Potassium] Other (See Comments)    dizziness  . Gabapentin   . Nsaids Other (See  Comments)    GI intolerance and pain all over, tolerates ibu   . Zetia [Ezetimibe] Swelling  . Rofecoxib Other (See Comments)    "VIOXX"- "SHUT DOWN MY KIDNEYS"  . Sulfa Antibiotics Nausea And Vomiting and Other (See Comments)    Pancreatitis    Family History  Problem Relation Age of Onset  . Heart failure Mother   . Hypertension Sister   . Heart attack Brother   . Heart attack Maternal Uncle   . Heart disease Sister   . COPD Brother   . Congestive Heart Failure Brother   . Congestive Heart Failure Daughter   . Rheum arthritis Son   . Ovarian cancer Sister   . Coronary artery disease Neg Hx        Early     Prior to Admission medications   Medication Sig Start Date End Date Taking? Authorizing Provider  acetaminophen (TYLENOL) 500 MG tablet Take 500 mg by mouth every 6 (six) hours as needed for moderate pain.    [provider]  albuterol (VENTOLIN HFA) 108 (90 Base) MCG/ACT inhaler Inhale 2 puffs into the lungs every 6 (six) hours as needed for wheezing or shortness of breath. 06/24/20   Dettinger, Fransisca Kaufmann, MD  blood glucose meter kit and supplies Use up to four times daily  as directed 04/16/19   Dettinger, Fransisca Kaufmann, MD  Cholecalciferol (VITAMIN D) 50 MCG (2000 UT) tablet Take 4,000 Units by mouth daily.     [provider]  clopidogrel (PLAVIX) 75 MG tablet Take 1 tablet (75 mg total) by mouth daily. 10/13/20   Cheryln Manly, NP  cyclobenzaprine (FLEXERIL) 10 MG tablet TAKE 1 TABLET THREE TIMES DAILY FOR MUSCLE SPASM 07/21/20   Dettinger, Fransisca Kaufmann, MD  empagliflozin (JARDIANCE) 10 MG TABS tablet Take 1 tablet (10 mg total) by mouth daily before breakfast. 09/09/20   Dettinger, Fransisca Kaufmann, MD  Fluticasone-Salmeterol (ADVAIR) 100-50 MCG/DOSE AEPB Inhale 1 puff into the lungs 2 (two) times daily. 09/09/20   Dettinger, Fransisca Kaufmann, MD  furosemide (LASIX) 20 MG tablet Take 2 tablets daily. You may take an extra dose for increase weight gain orswelling 09/09/20    Dettinger, Fransisca Kaufmann, MD  glimepiride (AMARYL) 2 MG tablet Take 1-2 tablets (2-4 mg total) by mouth daily with breakfast. 09/09/20   Dettinger, Fransisca Kaufmann, MD  glucose blood (ONETOUCH VERIO) test strip TEst BS BID and as needed Dx E11.9 03/30/20   Dettinger, Fransisca Kaufmann, MD  insulin degludec (TRESIBA FLEXTOUCH) 100 UNIT/ML FlexTouch Pen Inject 10 Units into the skin daily. 09/09/20   Dettinger, Fransisca Kaufmann, MD  Insulin Pen Needle (B-D UF III MINI PEN NEEDLES) 31G X 5 MM MISC 1 each by Does not apply route 4 (four) times daily. 11/20/19   Dettinger, Fransisca Kaufmann, MD  Lancets (ONETOUCH DELICA PLUS BPPHKF27M) [place] UP TO 4 TIMES DAILY AS DIRECTED Dx E11.9 06/21/20   Dettinger, Fransisca Kaufmann, MD  metoprolol succinate (TOPROL-XL) 25 MG 24 hr tablet Take 1 tablet (25 mg total) by mouth daily. 03/15/20   Bensimhon, Shaune Pascal, MD  Naphazoline-Pheniramine (OPCON-A) 0.027-0.315 % SOLN Place 1 drop into both eyes daily as needed (Burning and itching eyes).    [provider]  nitroGLYCERIN (NITRODUR - DOSED IN MG/24 HR) 0.2 mg/hr patch Place 1 patch (0.2 mg total) onto the skin daily. 11/02/20   Newt Minion, MD  nitroGLYCERIN (NITROSTAT) 0.3 MG SL tablet Place 1 tablet (0.3 mg total) under the tongue every 5 (five) minutes as needed for chest pain. 10/08/20 10/08/21  Rafael Bihari, FNP  pentoxifylline (TRENTAL) 400 MG CR tablet Take 1 tablet (400 mg total) by mouth 3 (three) times daily with meals. 11/02/20   Newt Minion, MD  rivaroxaban (XARELTO) 2.5 MG TABS tablet Take 1 tablet (2.5 mg total) by mouth 2 (two) times daily. 09/08/20   Wellington Hampshire, MD  traMADol (ULTRAM) 50 MG tablet Take 1 tablet (50 mg total) by mouth every 6 (six) hours as needed for moderate pain. 11/09/20   Newt Minion, MD    Physical Exam: BP 118/63   Pulse 96   Temp 97.9 F (36.6 C) (Oral)   Resp (!) 22   Ht 5' (1.524 m)   Wt 51 kg   SpO2 91%   BMI 21.96 kg/m   . General: 80 y.o. year-old female well developed in no  acute distress.  Alert and oriented x3. Marland Kitchen HEENT: NCAT, EOMI . Neck: Supple, trachea medial . Cardiovascular: Regular rate and rhythm with no rubs or gallops.  No thyromegaly or JVD noted.  No lower extremity edema. 2/4 pulses in all 4 extremities. Marland Kitchen Respiratory: Mild diffuse expiratory wheezing clear to auscultation with no wheezes or rales. Good inspiratory effort. . Abdomen: Soft nontender nondistended with normal bowel sounds  x4 quadrants. . Muskuloskeletal: Loop recorder device noted on left chest.  No cyanosis, clubbing or edema noted bilaterally . Neuro: CN II-XII intact, sensation, reflexes intact . Skin: No ulcerative lesions noted or rashes . Psychiatry: Mood is appropriate for condition and setting          Labs on Admission:  Basic Metabolic Panel: Recent Labs  Lab 01/12/21 1014 01/13/21 2232 01/16/21 2255  NA 140 133* 135  K 4.3 4.2 4.3  CL 101 96* 97*  CO2 '30 27 27  ' GLUCOSE 94 555* 282*  BUN 30* 38* 50*  CREATININE 1.30* 1.53* 1.70*  CALCIUM 9.5 9.2 9.3   Liver Function Tests: Recent Labs  Lab 01/13/21 2232  AST 18  ALT 20  ALKPHOS 48  BILITOT 0.8  PROT 6.7  ALBUMIN 3.9   Recent Labs  Lab 01/13/21 2232  LIPASE 73*   No results for input(s): AMMONIA in the last 168 hours. CBC: Recent Labs  Lab 01/12/21 1014 01/13/21 2232 01/16/21 2255  WBC 12.2* 9.5 11.0*  NEUTROABS  --  7.4 8.1*  HGB 11.1* 11.1* 11.6*  HCT 34.1* 34.1* 35.7*  MCV 106.2* 107.2* 106.6*  PLT 284 264 225   Cardiac Enzymes: No results for input(s): CKTOTAL, CKMB, CKMBINDEX, TROPONINI in the last 168 hours.  BNP (last 3 results) Recent Labs    01/12/21 1014 01/13/21 2232 01/16/21 2255  BNP 284.4* 371.1* 303.0*    ProBNP (last 3 results) No results for input(s): PROBNP in the last 8760 hours.  CBG: Recent Labs  Lab 01/14/21 0438  GLUCAP 186*    Radiological Exams on Admission: DG Chest Port 1 View  Result Date: 01/16/2021 CLINICAL DATA:  Shortness of breath  EXAM: PORTABLE CHEST 1 VIEW COMPARISON:  January 13, 2021 FINDINGS: The heart size and mediastinal contours are within normal limits. Chronic parenchymal lung changes with mild hyperinflation. Loop recorder device overlies the left chest. No focal consolidation. No pleural effusion. No pneumothorax. The visualized skeletal structures are unchanged. IMPRESSION: No acute cardiopulmonary process. Electronically Signed   By: Dahlia Bailiff MD   On: 01/16/2021 23:28    EKG: I independently viewed the EKG done and my findings are as followed: EKG was not done in the ED   Assessment/Plan Present on Admission: . Acute respiratory failure with hypoxia ([place]) . Coronary artery disease involving native coronary artery of native heart with angina pectoris (HCC)  Principal Problem:   Acute respiratory failure with hypoxia (HCC) Active Problems:   COPD with acute exacerbation (HCC)   Coronary artery disease involving native coronary artery of native heart with angina pectoris (HCC)   Hyperlipidemia   Chronic combined systolic and diastolic CHF (congestive heart failure) (HCC)   Elevated MCV   Leukocytosis   CKD (chronic kidney disease), stage III (HCC)   Hyperglycemia due to diabetes mellitus ([place])  Acute respiratory failure with hypoxia secondary to multifactorial including mild exhibition of COPD in the setting of combined systolic and diastolic CHF  Chest x-ray showed no  acute cardiopulmonary process COVID-19 test done 4/22 was negative, current COVID-19 test pending Continue duo nebs, Mucinex, Solu-Medrol, Dulera, azithromycin. Continue Protonix to prevent steroid-induced ulcer Continue incentive spirometry and flutter valve Continue total input/output, daily weights and fluid restriction Continue home Toprol Continue Cardiac diet  Echocardiogram done on October 21, 2020 showed LVEF of 40-45% with grade 1 diastolic dysfunction Continue supplemental oxygen to maintain O2 sat > 92% with plan to  wean patient off oxygen as tolerated (patient does  not use oxygen at baseline)  Leukocytosis possibly secondary to steroid effect WBC 11.0, continue to monitor WBC with morning labs  Hyperglycemia secondary to T2DM Continue ISS and hypoglycemic protocol Jardiance and glimepiride will be held at this time  CKD stage IIIB BUN to creatinine 50/1.70, baseline creatinine 1.3-1.5), eGFR 30 Renally adjust medications, avoid nephrotoxic agents/dehydration/hypotension  Elevated MCV MCV 106.6; folate level vitamin B12 will be checked  CAD/PAD Continue Plavix, nitroglycerin, and Xarelto  Chronic back pain Continue Tylenol and Flexeril   DVT prophylaxis: Xarelto  Code Status: Full code  Family Communication: None at bedside  Disposition Plan:  Patient is from:                        home Anticipated DC to:                   SNF or family members home Anticipated DC date:               2-3 days Anticipated DC barriers:           Patient needs inpatient management of Acute respiratory failure with hypoxia  Consults called: None  Admission status: Observation    Bernadette Hoit MD [facility]  01/17/2021, 1:32 AM

## 2021-01-17 NOTE — Progress Notes (Signed)
Pt states she only has her phone with her

## 2021-01-18 ENCOUNTER — Telehealth (HOSPITAL_COMMUNITY): Payer: Self-pay | Admitting: *Deleted

## 2021-01-18 ENCOUNTER — Inpatient Hospital Stay (HOSPITAL_COMMUNITY): Payer: Medicare Other

## 2021-01-18 DIAGNOSIS — J441 Chronic obstructive pulmonary disease with (acute) exacerbation: Secondary | ICD-10-CM | POA: Diagnosis present

## 2021-01-18 DIAGNOSIS — Y92239 Unspecified place in hospital as the place of occurrence of the external cause: Secondary | ICD-10-CM | POA: Diagnosis not present

## 2021-01-18 DIAGNOSIS — I2510 Atherosclerotic heart disease of native coronary artery without angina pectoris: Secondary | ICD-10-CM | POA: Diagnosis present

## 2021-01-18 DIAGNOSIS — Z20822 Contact with and (suspected) exposure to covid-19: Secondary | ICD-10-CM | POA: Diagnosis present

## 2021-01-18 DIAGNOSIS — D72829 Elevated white blood cell count, unspecified: Secondary | ICD-10-CM | POA: Diagnosis present

## 2021-01-18 DIAGNOSIS — Z7901 Long term (current) use of anticoagulants: Secondary | ICD-10-CM | POA: Diagnosis not present

## 2021-01-18 DIAGNOSIS — I5043 Acute on chronic combined systolic (congestive) and diastolic (congestive) heart failure: Secondary | ICD-10-CM | POA: Diagnosis present

## 2021-01-18 DIAGNOSIS — I272 Pulmonary hypertension, unspecified: Secondary | ICD-10-CM | POA: Diagnosis present

## 2021-01-18 DIAGNOSIS — Z7951 Long term (current) use of inhaled steroids: Secondary | ICD-10-CM | POA: Diagnosis not present

## 2021-01-18 DIAGNOSIS — Z7902 Long term (current) use of antithrombotics/antiplatelets: Secondary | ICD-10-CM | POA: Diagnosis not present

## 2021-01-18 DIAGNOSIS — E785 Hyperlipidemia, unspecified: Secondary | ICD-10-CM | POA: Diagnosis present

## 2021-01-18 DIAGNOSIS — R0602 Shortness of breath: Secondary | ICD-10-CM | POA: Diagnosis not present

## 2021-01-18 DIAGNOSIS — E1165 Type 2 diabetes mellitus with hyperglycemia: Secondary | ICD-10-CM | POA: Diagnosis not present

## 2021-01-18 DIAGNOSIS — D631 Anemia in chronic kidney disease: Secondary | ICD-10-CM | POA: Diagnosis present

## 2021-01-18 DIAGNOSIS — J9601 Acute respiratory failure with hypoxia: Secondary | ICD-10-CM | POA: Diagnosis not present

## 2021-01-18 DIAGNOSIS — Z825 Family history of asthma and other chronic lower respiratory diseases: Secondary | ICD-10-CM | POA: Diagnosis not present

## 2021-01-18 DIAGNOSIS — Z955 Presence of coronary angioplasty implant and graft: Secondary | ICD-10-CM | POA: Diagnosis not present

## 2021-01-18 DIAGNOSIS — Z79899 Other long term (current) drug therapy: Secondary | ICD-10-CM | POA: Diagnosis not present

## 2021-01-18 DIAGNOSIS — F17210 Nicotine dependence, cigarettes, uncomplicated: Secondary | ICD-10-CM | POA: Diagnosis present

## 2021-01-18 DIAGNOSIS — Z8249 Family history of ischemic heart disease and other diseases of the circulatory system: Secondary | ICD-10-CM | POA: Diagnosis not present

## 2021-01-18 DIAGNOSIS — E1122 Type 2 diabetes mellitus with diabetic chronic kidney disease: Secondary | ICD-10-CM | POA: Diagnosis present

## 2021-01-18 DIAGNOSIS — Z8041 Family history of malignant neoplasm of ovary: Secondary | ICD-10-CM | POA: Diagnosis not present

## 2021-01-18 DIAGNOSIS — Z794 Long term (current) use of insulin: Secondary | ICD-10-CM | POA: Diagnosis not present

## 2021-01-18 DIAGNOSIS — Z8551 Personal history of malignant neoplasm of bladder: Secondary | ICD-10-CM | POA: Diagnosis not present

## 2021-01-18 DIAGNOSIS — E1151 Type 2 diabetes mellitus with diabetic peripheral angiopathy without gangrene: Secondary | ICD-10-CM | POA: Diagnosis present

## 2021-01-18 DIAGNOSIS — Z7984 Long term (current) use of oral hypoglycemic drugs: Secondary | ICD-10-CM | POA: Diagnosis not present

## 2021-01-18 LAB — GLUCOSE, CAPILLARY
Glucose-Capillary: 193 mg/dL — ABNORMAL HIGH (ref 70–99)
Glucose-Capillary: 262 mg/dL — ABNORMAL HIGH (ref 70–99)
Glucose-Capillary: 320 mg/dL — ABNORMAL HIGH (ref 70–99)
Glucose-Capillary: 367 mg/dL — ABNORMAL HIGH (ref 70–99)

## 2021-01-18 MED ORDER — METHYLPREDNISOLONE SODIUM SUCC 40 MG IJ SOLR
40.0000 mg | Freq: Two times a day (BID) | INTRAMUSCULAR | Status: DC
  Administered 2021-01-18 – 2021-01-19 (×2): 40 mg via INTRAVENOUS
  Filled 2021-01-18 (×2): qty 1

## 2021-01-18 MED ORDER — FUROSEMIDE 10 MG/ML IJ SOLN
20.0000 mg | Freq: Every day | INTRAMUSCULAR | Status: DC
  Administered 2021-01-18 – 2021-01-19 (×2): 20 mg via INTRAVENOUS
  Filled 2021-01-18 (×2): qty 2

## 2021-01-18 MED ORDER — GUAIFENESIN-CODEINE 100-10 MG/5ML PO SOLN
10.0000 mL | Freq: Once | ORAL | Status: AC
  Administered 2021-01-18: 10 mL via ORAL
  Filled 2021-01-18: qty 10

## 2021-01-18 MED ORDER — METHYLPREDNISOLONE SODIUM SUCC 40 MG IJ SOLR: 40.0000 mg | Freq: Two times a day (BID) | INTRAMUSCULAR | Status: DC

## 2021-01-18 MED ORDER — INSULIN GLARGINE 100 UNIT/ML ~~LOC~~ SOLN
14.0000 [IU] | Freq: Every day | SUBCUTANEOUS | Status: DC
  Administered 2021-01-18: 14 [IU] via SUBCUTANEOUS
  Filled 2021-01-18 (×2): qty 0.14

## 2021-01-18 NOTE — Telephone Encounter (Signed)
Pts son called and wanted to inform the team that pt is admitted to [facility].    Routed to [place] as Juluis Rainier

## 2021-01-18 NOTE — Progress Notes (Signed)
Inpatient Diabetes Program Recommendations  AACE/ADA: New Consensus Statement on Inpatient Glycemic Control (2015)  Target Ranges:  Prepandial:   less than 140 mg/dL      Peak postprandial:   less than 180 mg/dL (1-2 hours)      Critically ill patients:  140 - 180 mg/dL   Lab Results  Component Value Date   GLUCAP 262 (H) 01/18/2021   HGBA1C 9.8 (H) 01/17/2021    Review of Glycemic Control Results for PRISEIS, CRATTY (MRN [id number]) as of 01/18/2021 15:27  Ref. Range 01/17/2021 15:36 01/17/2021 16:31 01/17/2021 20:30 01/18/2021 07:22 01/18/2021 11:14  Glucose-Capillary Latest Ref Range: 70 - 99 mg/dL 362 (H) 322 (H) 324 (H) 193 (H) 262 (H)   Diabetes history: DM 2 Outpatient Diabetes medications:  Amaryl 2-4 mg daily, Tresiba 10 units daily Current orders for Inpatient glycemic control:  Novolog 0-6 units tid with meals and HS Lantus 10 units daily Solumedrol 20 mg IV q 12 hours Inpatient Diabetes Program Recommendations:   May consider adding Novolog meal coverage 2 units tid with meals (hold if patient eats less than 50% or NPO).   Thanks,  Adah Perl, RN, BC-ADM Inpatient Diabetes Coordinator Pager [phone] (8a-5p)

## 2021-01-18 NOTE — TOC Initial Note (Signed)
Transition of Care [facility]) - Initial/Assessment Note    Patient Details  Name: Hannah French MRN: [id number] Date of Birth: 1940-10-22  Transition of [facility] CM/SW Contact:    Boneta Lucks, RN Phone Number: 01/18/2021, 3:53 PM  Clinical Narrative:     Patient admitted with Acute respiratory failure with hypoxia. Currently on oxygen, does not use at home. Patient has a high risk for readmission. TOC spoke with her daughter Earlene Plater. Patient lives alone but [place] checks on her often.  Patient is independent at baseline. She has a cane to use if needed. Per her daughter she should use it more often. She also can drive but does not drive often.  Lawana take her to doctors appointments. Patient has CHF.  Her cardiologist education them last week, they have a book and a chart with medication adjustments as needed. She does do daily weights. Earlene Plater said they are following diet together. No needs identified.          Expected Discharge Plan: Home/Self Care Barriers to Discharge: Continued Medical Work up   Patient Goals and CMS Choice Patient states their goals for this hospitalization and ongoing recovery are:: to go home. CMS Medicare.gov Compare Post Acute Care list provided to:: Patient Represenative (must comment) Choice offered to / list presented to : Adult Children  Expected Discharge Plan and Services Expected Discharge Plan: Home/Self Care       Prior Living Arrangements/Services   Lives with:: Self Patient language and need for interpreter reviewed:: Yes        Need for Family Participation in Patient Care: Yes (Comment) Care giver support system in place?: Yes (comment)   Criminal Activity/Legal Involvement Pertinent to Current Situation/Hospitalization: No - Comment as needed  Activities of Daily Living   ADL Screening (condition at time of admission) Patient's cognitive ability adequate to safely complete daily activities?: Yes Is the patient deaf or have  difficulty hearing?: No Does the patient have difficulty seeing, even when wearing glasses/contacts?: No Does the patient have difficulty concentrating, remembering, or making decisions?: No Patient able to express need for assistance with ADLs?: Yes Does the patient have difficulty dressing or bathing?: No Independently performs ADLs?: Yes (appropriate for developmental age) Does the patient have difficulty walking or climbing stairs?: No Weakness of Legs: None Weakness of Arms/Hands: None  Permission Sought/Granted      Share Information with NAME: Lawana     Permission granted to share info w Relationship: daughter     Emotional Assessment    Alcohol / Substance Use: Not Applicable Psych Involvement: No (comment)  Admission diagnosis:  COPD exacerbation ([place]) [J44.1] Acute respiratory failure with hypoxia ([place]) [J96.01] Patient Active Problem List   Diagnosis Date Noted  . Acute respiratory failure with hypoxia ([place]) 01/17/2021  . Elevated MCV 01/17/2021  . Leukocytosis 01/17/2021  . CKD (chronic kidney disease), stage III ([place]) 01/17/2021  . Hyperglycemia due to diabetes mellitus ([place]) 01/17/2021  . Skin abscess 01/15/2020  . Depression, recurrent ([place]) 08/08/2019  . Cigarette smoker 06/19/2019  . Vitamin D deficiency 01/29/2019  . Dizziness 12/03/2018  . Chronic combined systolic and diastolic CHF (congestive heart failure) ([place]) 12/03/2018  . Coronary artery disease involving native coronary artery of native heart with angina pectoris ([place]) 10/04/2018  . Hyperlipidemia 10/04/2018  . Pain in right hand 10/02/2018  . Myalgia due to statin 03/29/2018  . COPD with acute exacerbation ([place]) 10/26/2017  . Tobacco abuse 03/07/2016  . Type 2 diabetes mellitus ([place]) 08/26/2015  .  Osteoporosis 09/02/2014  . Carotid stenosis 07/22/2012  . PVD 07/02/2009  . BLADDER CANCER 06/05/2009  . Hyperlipidemia associated with type 2 diabetes mellitus ([place]) 06/05/2009  . PSEUDOGOUT  06/05/2009  . MITRAL VALVE PROLAPSE 06/05/2009   PCP:  Dettinger, Fransisca Kaufmann, MD Pharmacy:   [place], [place] [address] Phone: [phone] Fax: [phone]  [place], [place] - [address] [place] [address] [place] [address] Phone: [phone] Fax: [phone]  [facility] [address] [place] [address] Phone: [phone] Fax: [phone]    Readmission Risk Interventions Readmission Risk Prevention Plan 01/18/2021  Transportation Screening Complete  HRI or Home Care Consult Complete  Social Work Consult for [place] Planning/Counseling Complete  Palliative Care Screening Not Applicable  Medication Review Press photographer) Complete  Some recent data might be hidden

## 2021-01-18 NOTE — Progress Notes (Addendum)
Patient Demographics:    Danikka Woloszyk, is a 80 y.o. female, DOB - 11/03/1940, CC:[id number]  Admit date - 01/16/2021   Admitting Physician Zacharee Gaddie Denton Brick, MD  Outpatient Primary MD for the patient is Dettinger, Fransisca Kaufmann, MD  LOS - 0   Chief Complaint  Patient presents with  . Respiratory Distress        Subjective:    Arianny Prisbrey today has no fevers, no emesis,  No chest pain,  Cough and dyspnea persist -Patient's cough is actually quite bothersome for her--- conversational dyspnea, coughing repeatedly while talking  -Still requiring oxygen  Assessment  & Plan :    Principal Problem:   Acute respiratory failure with hypoxia ([place]) Active Problems:   COPD with acute exacerbation ([place])   Coronary artery disease involving native coronary artery of native heart with angina pectoris ([place])   Hyperlipidemia   Chronic combined systolic and diastolic CHF (congestive heart failure) (HCC)   Elevated MCV   Leukocytosis   CKD (chronic kidney disease), stage III (H[facility])   Hyperglycemia due to diabetes mellitus ([place])  Brief Summary:- 80 y.o.femalewith medical history significant forT2DM,hyperlipidemia, coronary artery disease, combined systolic and diastolic heart failure, peripheral vascular diseaseandCOPD on rivaroxaban 2.5 mg for CAD/PAD prevention presents with worsening shortness of breath and acute hypoxic respiratory failure  A/p  1)Acute hypoxic respiratory failure--multifactorial suspect mostly due to combined systolic and diastolic dysfunction CHF, some component of COPD as well -Currently on 3 L of oxygen via nasal cannula which is new for her -V/Q low probability for PE -Chest x-ray without pneumonia -COVID-negative -EF from echo of 10/21/2020 is 40- 45%, there was grade 1 diastolic dysfunction and hypokinesis of the left ventricle -BNP in the 300 range, will repeat  BMP in a.m. -Repeat chest x-ray in a.m. 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 Haroldine Laws placed a Zio patch  7) social/ethics--- patient is a full code without limitations of treatment -Additional history obtained from patient's daughter -Patient's daughter Earlene Plater updated by phone on 01/18/2021  Disposition/Need for in-Hospital Stay- patient unable to be discharged at this time due to -CHF and COPD exacerbation with acute hypoxic respiratory failure requiring IV diuresis and IV steroids as well as supplemental oxygen  Status is: Inpatient  Remains inpatient appropriate because:Please see disposition above  Disposition: The patient is from: Home              Anticipated d/c is to: Home with home health  Anticipated d/c date is: 2 days most likely will need home oxygen and home health upon discharge home on 01/20/2021              Patient currently is not medically stable to d/c. Barriers: Not Clinically Stable-   Code Status : -  Code Status: Full Code   Family  Communication:   (patient is alert, awake and coherent) -Discussed with -Patient's daughter Earlene Plater updated by phone on 01/18/2021  Consults  :  na  DVT Prophylaxis  :   - SCDs  SCDs Start: 01/17/21 0250 rivaroxaban (XARELTO) tablet 2.5 mg    Lab Results  Component Value Date   PLT 208 01/17/2021    Inpatient Medications  Scheduled Meds: . azithromycin  250 mg Oral Daily  . clopidogrel  75 mg Oral Daily  . dextromethorphan-guaiFENesin  1 tablet Oral BID  . insulin aspart  0-5 Units Subcutaneous QHS  . insulin aspart  0-6 Units Subcutaneous TID WC  . insulin glargine  10 Units Subcutaneous Daily  . ipratropium-albuterol  3 mL Nebulization TID  . methylPREDNISolone (SOLU-MEDROL) injection  20 mg Intravenous Q12H  . metoprolol succinate  25 mg Oral Daily  . mometasone-formoterol  2 puff Inhalation BID  . pantoprazole  40 mg Oral Daily  . rivaroxaban  2.5 mg Oral BID   Continuous Infusions: PRN Meds:.acetaminophen, cyclobenzaprine, ipratropium-albuterol, nitroGLYCERIN    Anti-infectives (From admission, onward)   Start     Dose/Rate Route Frequency Ordered Stop   01/18/21 1000  azithromycin (ZITHROMAX) tablet 250 mg       "Followed by" Linked Group Details   250 mg Oral Daily 01/17/21 0135 01/22/21 0959   01/17/21 1000  azithromycin (ZITHROMAX) tablet 500 mg       "Followed by" Linked Group Details   500 mg Oral Daily 01/17/21 0135 01/17/21 0900        Objective:   Vitals:   01/18/21 0806 01/18/21 0858 01/18/21 1429 01/18/21 1628  BP:  (!) 112/51  (!) 111/37  Pulse:  75  81  Resp:    20  Temp:    98.3 F (36.8 C)  TempSrc:    Oral  SpO2: 100%  95% 94%  Weight:      Height:        Wt Readings from Last 3 Encounters:  01/18/21 55.4 kg  01/12/21 51.4 kg  12/09/20 51.3 kg     Intake/Output Summary (Last 24 hours) at 01/18/2021 1720 Last data filed at 01/18/2021 1300 Gross per 24 hour  Intake 960 ml  Output --  Net 960 ml    Physical Exam  Gen:- Awake  Alert, conversational dyspnea, coughing repeatedly while talking HEENT:- [place].AT, No sclera icterus Nose-[place] 3L/min Neck-Supple Neck,No JVD,.  Lungs-diminished breath sounds, scattered rhonchi and wheezes bilaterally  CV- S1, S2 normal, regular , left chest wall with Zio patch Abd-  +ve B.Sounds, Abd Soft, No tenderness,    Extremity/Skin:- No  edema, pedal pulses present  Psych-affect is anxious at times, oriented x3 Neuro-generalized weakness, no new focal deficits, no tremors   Data Review:   Micro Results Recent Results (from the past 240 hour(s))  SARS CORONAVIRUS 2 (TAT 6-24 HRS) Nasopharyngeal Nasopharyngeal Swab     Status: None   Collection Time: 01/13/21  1:08 AM   Specimen: Nasopharyngeal Swab  Result Value Ref Range Status   SARS Coronavirus 2 NEGATIVE NEGATIVE Final    Comment: (NOTE) SARS-CoV-2 target nucleic acids are NOT DETECTED.  The  SARS-CoV-2 RNA is generally detectable in upper and lower respiratory specimens during the acute phase of infection. Negative results do not preclude SARS-CoV-2 infection, do not rule out co-infections with other pathogens, and should not be used as the sole basis for treatment or other patient management decisions. Negative results must be combined with clinical observations, patient history, and epidemiological information. The expected result is Negative.  Fact Sheet for Patients: SugarRoll.be  Fact Sheet for Healthcare Providers: https://www.woods-mathews.com/  This test is not yet approved or cleared by the [place] FDA and  has been authorized for detection and/or diagnosis of SARS-CoV-2 by FDA under an Emergency Use Authorization (EUA). This EUA will remain  in effect (meaning this test can be used) for the duration of the COVID-19 declaration under Se ction 564(b)(1) of the Act, 21 U.S.C. section 360bbb-3(b)(1), unless the authorization is terminated or revoked  sooner.  Performed at [facility], [place] [address], [place], [place] [address]   Resp Panel by RT-PCR (Flu A&B, Covid) Nasopharyngeal Swab     Status: None   Collection Time: 01/14/21  4:40 AM   Specimen: Nasopharyngeal Swab; Nasopharyngeal(NP) swabs in vial transport medium  Result Value Ref Range Status   SARS Coronavirus 2 by RT PCR NEGATIVE NEGATIVE Final    Comment: (NOTE) SARS-CoV-2 target nucleic acids are NOT DETECTED.  The SARS-CoV-2 RNA is generally detectable in upper respiratory specimens during the acute phase of infection. The lowest concentration of SARS-CoV-2 viral copies this assay can detect is 138 copies/mL. A negative result does not preclude SARS-Cov-2 infection and should not be used as the sole basis for treatment or other patient management decisions. A negative result may occur with  improper specimen collection/handling, submission of specimen other than nasopharyngeal swab, presence of viral mutation(s) within the areas targeted by this assay, and inadequate number of viral copies(<138 copies/mL). A negative result must be combined with clinical observations, patient history, and epidemiological information. The expected result is Negative.  Fact Sheet for Patients:  EntrepreneurPulse.com.au  Fact Sheet for Healthcare Providers:  IncredibleEmployment.be  This test is no t yet approved or cleared by the [place] FDA and  has been authorized for detection and/or diagnosis of SARS-CoV-2 by FDA under an Emergency Use Authorization (EUA). This EUA will remain  in effect (meaning this test can be used) for the duration of the COVID-19 declaration under Section 564(b)(1) of the Act, 21 U.S.C.section 360bbb-3(b)(1), unless the authorization is terminated  or revoked sooner.       Influenza A by PCR NEGATIVE NEGATIVE Final   Influenza B by PCR NEGATIVE NEGATIVE Final    Comment: (NOTE) The Xpert Xpress  SARS-CoV-2/FLU/RSV plus assay is intended as an aid in the diagnosis of influenza from Nasopharyngeal swab specimens and should not be used as a sole basis for treatment. Nasal washings and aspirates are unacceptable for Xpert Xpress SARS-CoV-2/FLU/RSV testing.  Fact Sheet for Patients: EntrepreneurPulse.com.au  Fact Sheet for Healthcare Providers: IncredibleEmployment.be  This test is not yet approved or cleared by the [place] FDA and has been authorized for detection and/or diagnosis of SARS-CoV-2 by FDA under an Emergency Use Authorization (EUA). This EUA will remain in effect (meaning this test can be used) for the duration of the COVID-19 declaration under Section 564(b)(1) of the Act, 21 U.S.C. section 360bbb-3(b)(1), unless the authorization is terminated or revoked.  Performed at [facility], [place] [address], [place], [place] [address]   Resp Panel by RT-PCR (Flu A&B,  Covid) Nasopharyngeal Swab     Status: None   Collection Time: 01/17/21 12:30 AM   Specimen: Nasopharyngeal Swab; Nasopharyngeal(NP) swabs in vial transport medium  Result Value Ref Range Status   SARS Coronavirus 2 by RT PCR NEGATIVE NEGATIVE Final    Comment: (NOTE) SARS-CoV-2 target nucleic acids are NOT DETECTED.  The SARS-CoV-2 RNA is generally detectable in upper respiratory specimens during the acute phase of infection. The lowest concentration of SARS-CoV-2 viral copies this assay can detect is 138 copies/mL. A negative result does not preclude SARS-Cov-2 infection and should not be used as the sole basis for treatment or other patient management decisions. A negative result may occur with  improper specimen collection/handling, submission of specimen other than nasopharyngeal swab, presence of viral mutation(s) within the areas targeted by this assay, and inadequate number of viral copies(<138 copies/mL). A negative result must be combined  with clinical observations, patient history, and epidemiological information. The expected result is Negative.  Fact Sheet for Patients:  EntrepreneurPulse.com.au  Fact Sheet for Healthcare Providers:  IncredibleEmployment.be  This test is no t yet approved or cleared by the [place] FDA and  has been authorized for detection and/or diagnosis of SARS-CoV-2 by FDA under an Emergency Use Authorization (EUA). This EUA will remain  in effect (meaning this test can be used) for the duration of the COVID-19 declaration under Section 564(b)(1) of the Act, 21 U.S.C.section 360bbb-3(b)(1), unless the authorization is terminated  or revoked sooner.       Influenza A by PCR NEGATIVE NEGATIVE Final   Influenza B by PCR NEGATIVE NEGATIVE Final    Comment: (NOTE) The Xpert Xpress SARS-CoV-2/FLU/RSV plus assay is intended as an aid in the diagnosis of influenza from Nasopharyngeal swab specimens and should not be used as a sole basis for treatment. Nasal washings and aspirates are unacceptable for Xpert Xpress SARS-CoV-2/FLU/RSV testing.  Fact Sheet for Patients: EntrepreneurPulse.com.au  Fact Sheet for Healthcare Providers: IncredibleEmployment.be  This test is not yet approved or cleared by the [place] FDA and has been authorized for detection and/or diagnosis of SARS-CoV-2 by FDA under an Emergency Use Authorization (EUA). This EUA will remain in effect (meaning this test can be used) for the duration of the COVID-19 declaration under Section 564(b)(1) of the Act, 21 U.S.C. section 360bbb-3(b)(1), unless the authorization is terminated or revoked.  Performed at [facility], [address] [place], [place] [address]     Radiology Reports CT ABDOMEN PELVIS WO CONTRAST  Result Date: 01/14/2021 CLINICAL DATA:  Upper abdominal pain EXAM: CT ABDOMEN AND PELVIS WITHOUT CONTRAST TECHNIQUE: Multidetector CT  imaging of the abdomen and pelvis was performed following the standard protocol without IV contrast. COMPARISON:  02/29/2016 FINDINGS: Lower chest: No acute abnormality. Hepatobiliary: No focal liver abnormality is seen. Status post cholecystectomy. No biliary dilatation. Pancreas: Unremarkable. No pancreatic ductal dilatation or surrounding inflammatory changes. Spleen: Normal in size without focal abnormality. Adrenals/Urinary Tract: Adrenal glands are within normal limits. Kidneys show no renal calculi or urinary tract obstructive changes. Bladder is partially distended Stomach/Bowel: Mild diverticular change of the colon is noted without evidence of diverticulitis. Appendix has been surgically removed. Small bowel and stomach are within normal limits. Vascular/Lymphatic: Aortic atherosclerosis. No enlarged abdominal or pelvic lymph nodes. Bilateral common iliac artery stents are seen. Reproductive: Status post hysterectomy. No adnexal masses. Other: No abdominal wall hernia or abnormality. No abdominopelvic ascites. Musculoskeletal: No acute or significant osseous findings. 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 Normal appearing pancreas without evidence  of inflammatory change. No acute abnormality noted. Electronically Signed   By: Inez Catalina M.D.   On: 01/14/2021 02:29   DG Chest 2 View  Result Date: 01/13/2021 CLINICAL DATA:  Shortness of breath EXAM: CHEST - 2 VIEW COMPARISON:  03/10/2019 FINDINGS: Mild hyperinflation. Heart is upper limits normal in size. Aortic atherosclerosis. No confluent opacities or effusions. No acute bony abnormality. IMPRESSION: No active cardiopulmonary disease. Electronically Signed   By: Rolm Baptise M.D.   On: 01/13/2021 22:38   NM Pulmonary Perfusion  Result Date: 01/17/2021 CLINICAL DATA:  Shortness of breath EXAM: NUCLEAR MEDICINE PERFUSION LUNG SCAN TECHNIQUE: Perfusion images were obtained in multiple projections after intravenous injection of  radiopharmaceutical. Views: Anterior, posterior, left lateral, right lateral, RPO, LPO, RAO, LAO RADIOPHARMACEUTICALS:  3.97 mCi Tc-70m MAA IV COMPARISON:  Chest radiograph January 16, 2021 FINDINGS: There are scattered subsegmental perfusion defects. No segmental level perfusion defect is evident. IMPRESSION: Scattered subsegmental perfusion defects suggesting a low probability of pulmonary embolus. Note that there is underlying emphysematous change by radiography. If there remains concern for potential pulmonary embolus, CT angiogram chest to further evaluate would be a reasonable consideration given that there are scattered subsegmental perfusion defects. If CT angiogram chest is contraindicated in this case, bilateral lower extremity duplex ultrasound would be a reasonable consideration as well. Electronically Signed   By: Lowella Grip III M.D.   On: 01/17/2021 12:39   DG Chest Port 1 View  Result Date: 01/16/2021 CLINICAL DATA:  Shortness of breath EXAM: PORTABLE CHEST 1 VIEW COMPARISON:  January 13, 2021 FINDINGS: The heart size and mediastinal contours are within normal limits. Chronic parenchymal lung changes with mild hyperinflation. Loop recorder device overlies the left chest. No focal consolidation. No pleural effusion. No pneumothorax. The visualized skeletal structures are unchanged. IMPRESSION: No acute cardiopulmonary process. Electronically Signed   By: Dahlia Bailiff MD   On: 01/16/2021 23:28     CBC Recent Labs  Lab 01/12/21 1014 01/13/21 2232 01/16/21 2255 01/17/21 0436  WBC 12.2* 9.5 11.0* 9.2  HGB 11.1* 11.1* 11.6* 10.6*  HCT 34.1* 34.1* 35.7* 32.5*  PLT 284 264 225 208  MCV 106.2* 107.2* 106.6* 108.0*  MCH 34.6* 34.9* 34.6* 35.2*  MCHC 32.6 32.6 32.5 32.6  RDW 19.9* 19.9* 19.9* 19.5*  LYMPHSABS  --  1.3 1.8  --   MONOABS  --  0.6 0.9  --   EOSABS  --  0.1 0.1  --   BASOSABS  --  0.1 0.1  --     Chemistries  Recent Labs  Lab 01/12/21 1014 01/13/21 2232  01/16/21 2255 01/17/21 0436  NA 140 133* 135 138  K 4.3 4.2 4.3 4.1  CL 101 96* 97* 98  CO2 30 27 27 28   GLUCOSE 94 555* 282* 229*  BUN 30* 38* 50* 45*  CREATININE 1.30* 1.53* 1.70* 1.49*  CALCIUM 9.5 9.2 9.3 9.2  MG  --   --   --  2.4  AST  --  18  --  27  ALT  --  20  --  28  ALKPHOS  --  48  --  41  BILITOT  --  0.8  --  1.5*    Lab Results  Component Value Date   HGBA1C 9.8 (H) 01/17/2021    Recent Labs    01/17/21 0435  VITAMINB12 565  FOLATE 19.2    Coagulation profile Recent Labs  Lab 01/17/21 0436  INR 1.2    No  results for input(s): DDIMER in the last 72 hours.  Cardiac Enzymes No results for input(s): CKMB, TROPONINI, MYOGLOBIN in the last 168 hours.  Invalid input(s): CK ------------------------------------------------------------------------------------------------------------------    Component Value Date/Time   BNP 303.0 (H) 01/16/2021 2255   Roxan Hockey M.D on 01/18/2021 at 5:20 PM  Go to [email or web address] - for contact info  [facility] - Office  [phone]

## 2021-01-19 ENCOUNTER — Inpatient Hospital Stay (HOSPITAL_COMMUNITY): Payer: Medicare Other

## 2021-01-19 LAB — RENAL FUNCTION PANEL
Albumin: 3.7 g/dL (ref 3.5–5.0)
Anion gap: 10 (ref 5–15)
BUN: 58 mg/dL — ABNORMAL HIGH (ref 8–23)
CO2: 28 mmol/L (ref 22–32)
Calcium: 9.2 mg/dL (ref 8.9–10.3)
Chloride: 99 mmol/L (ref 98–111)
Creatinine, Ser: 1.55 mg/dL — ABNORMAL HIGH (ref 0.44–1.00)
GFR, Estimated: 34 mL/min — ABNORMAL LOW (ref >60)
Glucose, Bld: 303 mg/dL — ABNORMAL HIGH (ref 70–99)
Phosphorus: 3.8 mg/dL (ref 2.5–4.6)
Potassium: 4.2 mmol/L (ref 3.5–5.1)
Sodium: 137 mmol/L (ref 135–145)

## 2021-01-19 LAB — GLUCOSE, CAPILLARY
Glucose-Capillary: 225 mg/dL — ABNORMAL HIGH (ref 70–99)
Glucose-Capillary: 412 mg/dL — ABNORMAL HIGH (ref 70–99)
Glucose-Capillary: 407 mg/dL — ABNORMAL HIGH (ref 70–99)
Glucose-Capillary: 400 mg/dL — ABNORMAL HIGH (ref 70–99)
Glucose-Capillary: 161 mg/dL — ABNORMAL HIGH (ref 70–99)

## 2021-01-19 LAB — BRAIN NATRIURETIC PEPTIDE: B Natriuretic Peptide: 288 pg/mL — ABNORMAL HIGH (ref 0.0–100.0)

## 2021-01-19 MED ORDER — AZITHROMYCIN 500 MG PO TABS: 500.0000 mg | ORAL_TABLET | Freq: Every day | ORAL | 0 refills | Status: AC

## 2021-01-19 MED ORDER — DM-GUAIFENESIN ER 30-600 MG PO TB12: 1.0000 | ORAL_TABLET | Freq: Two times a day (BID) | ORAL | 0 refills | Status: AC

## 2021-01-19 MED ORDER — CLARITIN-D 12 HOUR 5-120 MG PO TB12: 1.0000 | ORAL_TABLET | Freq: Two times a day (BID) | ORAL | 0 refills | Status: AC | PRN

## 2021-01-19 MED ORDER — INSULIN ASPART 100 UNIT/ML ~~LOC~~ SOLN
10.0000 [IU] | Freq: Once | SUBCUTANEOUS | Status: AC
  Administered 2021-01-19: 10 [IU] via SUBCUTANEOUS

## 2021-01-19 MED ORDER — IPRATROPIUM BROMIDE 0.02 % IN SOLN: 0.5000 mg | Freq: Four times a day (QID) | RESPIRATORY_TRACT | 12 refills | Status: DC

## 2021-01-19 MED ORDER — LEVALBUTEROL HCL 0.63 MG/3ML IN NEBU: 0.6300 mg | INHALATION_SOLUTION | RESPIRATORY_TRACT | 2 refills | Status: DC | PRN

## 2021-01-19 NOTE — TOC Transition Note (Addendum)
Transition of Care [facility]) - CM/SW Discharge Note   Patient Details  Name: Matha Masse MRN: [id number] Date of Birth: [birth year]  [facility] CM/SW Contact:  Salome Arnt, LCSW Phone Number: 01/19/2021, 2:10 PM   Clinical Narrative: Pt d/c today. Nebulizer ordered. LCSW discussed with pt who is agreeable to refer to [facility]. Referral made and nebulizer will be delivered to room prior to d/c. Pt and RN updated. No other needs reported.     Update: LCSW just received call from [place]. They spoke to pt and pt said she wasn't sure who was trying to order this nebulizer and didn't want to pay $13 so she canceled it on the phone with them. She said she would get it herself. RN and MD updated.      Final next level of care: Home/Self Care Barriers to Discharge: Barriers Resolved   Patient Goals and CMS Choice Patient states their goals for this hospitalization and ongoing recovery are:: to go home. CMS Medicare.gov Compare Post Acute Care list provided to:: Patient Represenative (must comment) Choice offered to / list presented to : Adult Children  Discharge Placement                    Patient and family notified of of transfer: 01/19/21  Discharge Plan and Services                DME Arranged: Nebulizer machine DME Agency: [facility] Date DME Agency Contacted: 01/19/21 Time DME Agency Contacted: 34 Representative spoke with at DME Agency: [place] ([place]) Interventions     Readmission Risk Interventions Readmission Risk Prevention Plan 01/18/2021  Transportation Screening Complete  HRI or [place] Complete  Social Work Consult for [place] Planning/Counseling Complete  Palliative Care Screening Not Applicable  Medication Review Press photographer) Complete  Some recent data might be hidden

## 2021-01-19 NOTE — Progress Notes (Signed)
Patient ambulated in the hallway on RA with a saturation of 93-95%.

## 2021-01-19 NOTE — Discharge Summary (Signed)
Physician Discharge Summary [facility]    Patient: Hannah French                   Admit date: 01/16/2021   DOB: 09-24-1941             Discharge date:01/19/2021/11:25 AM [id number]                          PCP: Dettinger, Fransisca Kaufmann, MD  Disposition: HOME  Recommendations for Outpatient Follow-up:   Follow up: PCP in 1 week.  Cardiology 1 week.  Discharge Condition: Stable   Code Status:   Code Status: Full Code  Diet recommendation: Diabetic diet   Discharge Diagnoses:    Principal Problem:   Acute respiratory failure with hypoxia ([place]) Active Problems:   COPD with acute exacerbation ([place])   Coronary artery disease involving native coronary artery of native heart with angina pectoris ([place])   Hyperlipidemia   Chronic combined systolic and diastolic CHF (congestive heart failure) (HCC)   Elevated MCV   Leukocytosis   CKD (chronic kidney disease), stage III (HCC)   Hyperglycemia due to diabetes mellitus ([place])   History of Present Illness/ Hospital Course Hannah French Summary:   Brief Summary:- 80 y.o.femalewith medical history significant forT2DM,hyperlipidemia, coronary artery disease, combined systolic and diastolic heart failure, peripheral vascular diseaseandCOPD on rivaroxaban2.5 mgfor CAD/PADprevention presents with worsening shortness of breath and acute hypoxic respiratory failure  A/p  1)Acute hypoxic respiratory failure--multifactorial suspect mostly due to combined systolic and diastolic dysfunction CHF,some component of COPD as well -Patient O2 demand has improved from 3 L to room air, patient was walked able to maintain O2 sat greater than 92%. -Proved shortness of breath and coughing -V/Q low probability for PE -Chest x-ray without pneumonia -COVID-negative -EF from echo of 10/21/2020 is 40- 45%, there was grade 1 diastolic dysfunction and hypokinesis of the left ventricle -BNP in the 300 range, will repeat BMP in  a.m. -Repeat chest x-ray in a.m. -Lasix 20 mg IV daily... Switch to p.o. 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 Bensimhon placed a Zio patch  7) social/ethics--- patient is a full code without limitations of treatment -Additional history obtained from patient's daughter -Patient's daughter Hannah French updated by phone on 01/18/2021    Disposition: The patient is from: Home   Family Communication:   (patient is alert, awake and coherent) -Discussed with -Patient's daughter Hannah French updated by phone on 01/18/2021   Risk of unplanned readmission Score:  Low intermittent risk   Discharge Instructions:   Discharge Instructions    Activity as tolerated - No restrictions   Complete by: As directed    Diet - low sodium heart healthy   Complete by: As directed    Discharge instructions   Complete by:  As directed    Continue inhaler Advair as instructed, along with every 4-6 hours of nebulizer machine with Xopenex/Atrovent as needed. Follow-up with your cardiologist, primary care physician in 1 to  2 weeks   Increase activity slowly   Complete by: As directed        Medication List    STOP taking these medications   empagliflozin 10 MG Tabs tablet Commonly known as: JARDIANCE     TAKE these medications   acetaminophen 500 MG tablet Commonly known as: TYLENOL Take 500 mg by mouth every 6 (six) hours as needed for moderate pain.   albuterol 108 (90 Base) MCG/ACT inhaler Commonly known as: VENTOLIN HFA Inhale 2 puffs into the lungs every 6 (six) hours as needed for wheezing or shortness of breath.   azithromycin 500 MG tablet Commonly known as: Zithromax Take 1 tablet (500 mg total) by mouth daily for 3 days. Take 1 tablet daily for 3 days.   Claritin-D 12 Hour 5-120 MG tablet Generic drug: loratadine-pseudoephedrine Take 1 tablet by mouth 2 (two) times daily as needed for allergies (S).   clopidogrel 75 MG tablet Commonly known as: Plavix Take 1 tablet (75 mg total) by mouth daily.   cyclobenzaprine 10 MG tablet Commonly known as: FLEXERIL TAKE 1 TABLET THREE TIMES DAILY FOR MUSCLE SPASM   dextromethorphan-guaiFENesin 30-600 MG 12hr tablet Commonly known as: MUCINEX DM Take 1 tablet by mouth 2 (two) times daily for 5 days.   Fluticasone-Salmeterol 100-50 MCG/DOSE Aepb Commonly known as: ADVAIR Inhale 1 puff into the lungs 2 (two) times daily.   furosemide 20 MG tablet Commonly known as: LASIX Take 2 tablets daily. You may take an extra dose for increase weight gain orswelling   glimepiride 2 MG tablet Commonly known as: AMARYL Take 1-2 tablets (2-4 mg total) by mouth daily with breakfast.   ipratropium 0.02 % nebulizer solution Commonly known as: ATROVENT Take 2.5 mLs (0.5 mg total) by nebulization 4 (four) times daily.   levalbuterol 0.63 MG/3ML nebulizer solution Commonly known as: XOPENEX Take 3 mLs (0.63 mg total) by nebulization every 4 (four) hours as needed for wheezing or shortness of breath.   metoprolol succinate 25 MG 24 hr tablet Commonly  known as: TOPROL-XL Take 1 tablet (25 mg total) by mouth daily.   nitroGLYCERIN 0.3 MG SL tablet Commonly known as: Nitrostat Place 1 tablet (0.3 mg total) under the tongue every 5 (five) minutes as needed for chest pain.   nitroGLYCERIN 0.2 mg/hr patch Commonly known as: NITRODUR - Dosed in mg/24 hr Place 1 patch (0.2 mg total) onto the skin daily.   Opcon-A 0.027-0.315 % Soln Generic drug: Naphazoline-Pheniramine Place 1 drop into both eyes daily as needed (Burning and itching eyes).   pentoxifylline 400 MG CR tablet Commonly known as: TRENTAL Take 1 tablet (400 mg total) by mouth 3 (three) times daily with meals.   rivaroxaban 2.5 MG Tabs tablet Commonly known as: XARELTO Take 1 tablet (2.5 mg total) by mouth 2 (two) times daily.   traMADol 50 MG tablet Commonly known as: ULTRAM Take 1 tablet (50 mg total) by mouth every 6 (six) hours as needed for moderate pain.   Tyler Aas FlexTouch 100 UNIT/ML FlexTouch Pen Generic drug: insulin degludec Inject 10 Units into the skin daily.   Vitamin D 50 MCG (2000 UT) tablet Take 4,000 Units by mouth daily.       Allergies  Allergen Reactions  . Contrast Media [Iodinated Diagnostic Agents] Shortness Of Breath  .  Iohexol Other (See Comments)    PASSED OUT DURING THE TEST    . Metformin Swelling    Face and hands became swollen  . Statins Swelling and Other (See Comments)    They make the patient hurt "all over" Myalgia, also  . Bextra [Valdecoxib] Nausea And Vomiting and Swelling    Shut down my kidneys   . Cefdinir Other (See Comments)    Pancreatitis   . Cozaar [Losartan Potassium] Other (See Comments)    dizziness  . Gabapentin   . Nsaids Other (See Comments)    GI intolerance and pain all over, tolerates ibu   . Zetia [Ezetimibe] Swelling  . Rofecoxib Other (See Comments)    "VIOXX"- "SHUT DOWN MY KIDNEYS"  . Sulfa Antibiotics Nausea And Vomiting and Other (See Comments)    Pancreatitis     Procedures  /Studies:   CT ABDOMEN PELVIS WO CONTRAST  Result Date: 01/14/2021 CLINICAL DATA:  Upper abdominal pain EXAM: CT ABDOMEN AND PELVIS WITHOUT CONTRAST TECHNIQUE: Multidetector CT imaging of the abdomen and pelvis was performed following the standard protocol without IV contrast. COMPARISON:  02/29/2016 FINDINGS: Lower chest: No acute abnormality. Hepatobiliary: No focal liver abnormality is seen. Status post cholecystectomy. No biliary dilatation. Pancreas: Unremarkable. No pancreatic ductal dilatation or surrounding inflammatory changes. Spleen: Normal in size without focal abnormality. Adrenals/Urinary Tract: Adrenal glands are within normal limits. Kidneys show no renal calculi or urinary tract obstructive changes. Bladder is partially distended Stomach/Bowel: Mild diverticular change of the colon is noted without evidence of diverticulitis. Appendix has been surgically removed. Small bowel and stomach are within normal limits. Vascular/Lymphatic: Aortic atherosclerosis. No enlarged abdominal or pelvic lymph nodes. Bilateral common iliac artery stents are seen. Reproductive: Status post hysterectomy. No adnexal masses. Other: No abdominal wall hernia or abnormality. No abdominopelvic ascites. Musculoskeletal: No acute or significant osseous findings. 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 Normal appearing pancreas without evidence of inflammatory change. No acute abnormality noted. Electronically Signed   By: Inez Catalina M.D.   On: 01/14/2021 02:29   DG Chest 2 View  Result Date: 01/13/2021 CLINICAL DATA:  Shortness of breath EXAM: CHEST - 2 VIEW COMPARISON:  03/10/2019 FINDINGS: Mild hyperinflation. Heart is upper limits normal in size. Aortic atherosclerosis. No confluent opacities or effusions. No acute bony abnormality. IMPRESSION: No active cardiopulmonary disease. Electronically Signed   By: Rolm Baptise M.D.   On: 01/13/2021 22:38   NM Pulmonary Perfusion  Result Date:  01/17/2021 CLINICAL DATA:  Shortness of breath EXAM: NUCLEAR MEDICINE PERFUSION LUNG SCAN TECHNIQUE: Perfusion images were obtained in multiple projections after intravenous injection of radiopharmaceutical. Views: Anterior, posterior, left lateral, right lateral, RPO, LPO, RAO, LAO RADIOPHARMACEUTICALS:  3.97 mCi Tc-31m MAA IV COMPARISON:  Chest radiograph January 16, 2021 FINDINGS: There are scattered subsegmental perfusion defects. No segmental level perfusion defect is evident. IMPRESSION: Scattered subsegmental perfusion defects suggesting a low probability of pulmonary embolus. Note that there is underlying emphysematous change by radiography. If there remains concern for potential pulmonary embolus, CT angiogram chest to further evaluate would be a reasonable consideration given that there are scattered subsegmental perfusion defects. If CT angiogram chest is contraindicated in this case, bilateral lower extremity duplex ultrasound would be a reasonable consideration as well. Electronically Signed   By: Lowella Grip III M.D.   On: 01/17/2021 12:39   DG Chest Port 1 View  Result Date: 01/16/2021 CLINICAL DATA:  Shortness of breath EXAM: PORTABLE CHEST 1 VIEW COMPARISON:  January 13, 2021 FINDINGS:  The heart size and mediastinal contours are within normal limits. Chronic parenchymal lung changes with mild hyperinflation. Loop recorder device overlies the left chest. No focal consolidation. No pleural effusion. No pneumothorax. The visualized skeletal structures are unchanged. IMPRESSION: No acute cardiopulmonary process. Electronically Signed   By: Dahlia Bailiff MD   On: 01/16/2021 23:28     Subjective:   Patient was seen and examined 01/19/2021, 11:25 AM Patient stable today. No acute distress.  No issues overnight Stable for discharge.  Discharge Exam:    Vitals:   01/19/21 0600 01/19/21 0756 01/19/21 0803 01/19/21 0809  BP: 135/65 132/72    Pulse: 93 63    Resp: 20     Temp: 98.5 F  (36.9 C)     TempSrc: Oral     SpO2:   100% 100%  Weight: 50.3 kg     Height:        General: Pt lying comfortably in bed & appears in no obvious distress. Cardiovascular: S1 & S2 heard, RRR, S1/S2 +. No murmurs, rubs, gallops or clicks. No JVD or pedal edema. Respiratory: Much improved wheezing from admission , improved shortness of breath  No  rhonchi or crackles. No increased work of breathing. Abdominal:  Non-distended, non-tender & soft. No organomegaly or masses appreciated. Normal bowel sounds heard. CNS: Alert and oriented. No focal deficits. Extremities: no edema, no cyanosis      The results of significant diagnostics from this hospitalization (including imaging, microbiology, ancillary and laboratory) are listed below for reference.      Microbiology:   Recent Results (from the past 240 hour(s))  SARS CORONAVIRUS 2 (TAT 6-24 HRS) Nasopharyngeal Nasopharyngeal Swab     Status: None   Collection Time: 01/13/21  1:08 AM   Specimen: Nasopharyngeal Swab  Result Value Ref Range Status   SARS Coronavirus 2 NEGATIVE NEGATIVE Final    Comment: (NOTE) SARS-CoV-2 target nucleic acids are NOT DETECTED.  The SARS-CoV-2 RNA is generally detectable in upper and lower respiratory specimens during the acute phase of infection. Negative results do not preclude SARS-CoV-2 infection, do not rule out co-infections with other pathogens, and should not be used as the sole basis for treatment or other patient management decisions. Negative results must be combined with clinical observations, patient history, and epidemiological information. The expected result is Negative.  Fact Sheet for Patients: SugarRoll.be  Fact Sheet for Healthcare Providers: https://www.woods-mathews.com/  This test is not yet approved or cleared by the [place] FDA and  has been authorized for detection and/or diagnosis of SARS-CoV-2 by FDA under an Emergency  Use Authorization (EUA). This EUA will remain  in effect (meaning this test can be used) for the duration of the COVID-19 declaration under Se ction 564(b)(1) of the Act, 21 U.S.C. section 360bbb-3(b)(1), unless the authorization is terminated or revoked sooner.  Performed at [facility], [place] [address] [place], [place] [address]   Resp Panel by RT-PCR (Flu A&B, Covid) Nasopharyngeal Swab     Status: None   Collection Time: 01/14/21  4:40 AM   Specimen: Nasopharyngeal Swab; Nasopharyngeal(NP) swabs in vial transport medium  Result Value Ref Range Status   SARS Coronavirus 2 by RT PCR NEGATIVE NEGATIVE Final    Comment: (NOTE) SARS-CoV-2 target nucleic acids are NOT DETECTED.  The SARS-CoV-2 RNA is generally detectable in upper respiratory specimens during the acute phase of infection. The lowest concentration of SARS-CoV-2 viral copies this assay can detect is 138 copies/mL. A negative result does  not preclude SARS-Cov-2 infection and should not be used as the sole basis for treatment or other patient management decisions. A negative result may occur with  improper specimen collection/handling, submission of specimen other than nasopharyngeal swab, presence of viral mutation(s) within the areas targeted by this assay, and inadequate number of viral copies(<138 copies/mL). A negative result must be combined with clinical observations, patient history, and epidemiological information. The expected result is Negative.  Fact Sheet for Patients:  EntrepreneurPulse.com.au  Fact Sheet for Healthcare Providers:  IncredibleEmployment.be  This test is no t yet approved or cleared by the [place] FDA and  has been authorized for detection and/or diagnosis of SARS-CoV-2 by FDA under an Emergency Use Authorization (EUA). This EUA will remain  in effect (meaning this test can be used) for the duration of the COVID-19 declaration under Section  564(b)(1) of the Act, 21 U.S.C.section 360bbb-3(b)(1), unless the authorization is terminated  or revoked sooner.       Influenza A by PCR NEGATIVE NEGATIVE Final   Influenza B by PCR NEGATIVE NEGATIVE Final    Comment: (NOTE) The Xpert Xpress SARS-CoV-2/FLU/RSV plus assay is intended as an aid in the diagnosis of influenza from Nasopharyngeal swab specimens and should not be used as a sole basis for treatment. Nasal washings and aspirates are unacceptable for Xpert Xpress SARS-CoV-2/FLU/RSV testing.  Fact Sheet for Patients: EntrepreneurPulse.com.au  Fact Sheet for Healthcare Providers: IncredibleEmployment.be  This test is not yet approved or cleared by the [place] FDA and has been authorized for detection and/or diagnosis of SARS-CoV-2 by FDA under an Emergency Use Authorization (EUA). This EUA will remain in effect (meaning this test can be used) for the duration of the COVID-19 declaration under Section 564(b)(1) of the Act, 21 U.S.C. section 360bbb-3(b)(1), unless the authorization is terminated or revoked.  Performed at [facility], [place] [address], [place], [place] [address]   Resp Panel by RT-PCR (Flu A&B, Covid) Nasopharyngeal Swab     Status: None   Collection Time: 01/17/21 12:30 AM   Specimen: Nasopharyngeal Swab; Nasopharyngeal(NP) swabs in vial transport medium  Result Value Ref Range Status   SARS Coronavirus 2 by RT PCR NEGATIVE NEGATIVE Final    Comment: (NOTE) SARS-CoV-2 target nucleic acids are NOT DETECTED.  The SARS-CoV-2 RNA is generally detectable in upper respiratory specimens during the acute phase of infection. The lowest concentration of SARS-CoV-2 viral copies this assay can detect is 138 copies/mL. A negative result does not preclude SARS-Cov-2 infection and should not be used as the sole basis for treatment or other patient management decisions. A negative result may occur with  improper  specimen collection/handling, submission of specimen other than nasopharyngeal swab, presence of viral mutation(s) within the areas targeted by this assay, and inadequate number of viral copies(<138 copies/mL). A negative result must be combined with clinical observations, patient history, and epidemiological information. The expected result is Negative.  Fact Sheet for Patients:  EntrepreneurPulse.com.au  Fact Sheet for Healthcare Providers:  IncredibleEmployment.be  This test is no t yet approved or cleared by the [place] FDA and  has been authorized for detection and/or diagnosis of SARS-CoV-2 by FDA under an Emergency Use Authorization (EUA). This EUA will remain  in effect (meaning this test can be used) for the duration of the COVID-19 declaration under Section 564(b)(1) of the Act, 21 U.S.C.section 360bbb-3(b)(1), unless the authorization is terminated  or revoked sooner.       Influenza A by PCR NEGATIVE NEGATIVE Final  Influenza B by PCR NEGATIVE NEGATIVE Final    Comment: (NOTE) The Xpert Xpress SARS-CoV-2/FLU/RSV plus assay is intended as an aid in the diagnosis of influenza from Nasopharyngeal swab specimens and should not be used as a sole basis for treatment. Nasal washings and aspirates are unacceptable for Xpert Xpress SARS-CoV-2/FLU/RSV testing.  Fact Sheet for Patients: EntrepreneurPulse.com.au  Fact Sheet for Healthcare Providers: IncredibleEmployment.be  This test is not yet approved or cleared by the [place] FDA and has been authorized for detection and/or diagnosis of SARS-CoV-2 by FDA under an Emergency Use Authorization (EUA). This EUA will remain in effect (meaning this test can be used) for the duration of the COVID-19 declaration under Section 564(b)(1) of the Act, 21 U.S.C. section 360bbb-3(b)(1), unless the authorization is terminated or revoked.  Performed at  [facility], [address], [place], [place] [address]      Labs:   CBC: Recent Labs  Lab 01/13/21 2232 01/16/21 2255 01/17/21 0436  WBC 9.5 11.0* 9.2  NEUTROABS 7.4 8.1*  --   HGB 11.1* 11.6* 10.6*  HCT 34.1* 35.7* 32.5*  MCV 107.2* 106.6* 108.0*  PLT 264 225 580   Basic Metabolic Panel: Recent Labs  Lab 01/13/21 2232 01/16/21 2255 01/17/21 0436 01/19/21 0522  NA 133* 135 138 137  K 4.2 4.3 4.1 4.2  CL 96* 97* 98 99  CO2 27 27 28 28   GLUCOSE 555* 282* 229* 303*  BUN 38* 50* 45* 58*  CREATININE 1.53* 1.70* 1.49* 1.55*  CALCIUM 9.2 9.3 9.2 9.2  MG  --   --  2.4  --   PHOS  --   --  3.5 3.8   Liver Function Tests: Recent Labs  Lab 01/13/21 2232 01/17/21 0436 01/19/21 0522  AST 18 27  --   ALT 20 28  --   ALKPHOS 48 41  --   BILITOT 0.8 1.5*  --   PROT 6.7 6.2*  --   ALBUMIN 3.9 3.8 3.7   BNP (last 3 results) Recent Labs    01/13/21 2232 01/16/21 2255 01/19/21 0522  BNP 371.1* 303.0* 288.0*   Cardiac Enzymes: No results for input(s): CKTOTAL, CKMB, CKMBINDEX, TROPONINI in the last 168 hours. CBG: Recent Labs  Lab 01/18/21 1114 01/18/21 1634 01/18/21 2052 01/19/21 0733 01/19/21 1123  GLUCAP 262* 320* 367* 225* 412*   Hgb A1c Recent Labs    01/17/21 0436  HGBA1C 9.8*   Lipid Profile No results for input(s): CHOL, HDL, LDLCALC, TRIG, CHOLHDL, LDLDIRECT in the last 72 hours. Thyroid function studies No results for input(s): TSH, T4TOTAL, T3FREE, THYROIDAB in the last 72 hours.  Invalid input(s): FREET3 Anemia work up Recent Labs    01/17/21 0435  VITAMINB12 565  FOLATE 19.2   Urinalysis    Component Value Date/Time   COLORURINE YELLOW 10/09/2020 [place] 10/09/2020 1740   APPEARANCEUR Clear 05/16/2018 1629   LABSPEC 1.014 10/09/2020 1740   PHURINE 5.0 10/09/2020 1740   GLUCOSEU >=500 (A) 10/09/2020 1740   HGBUR NEGATIVE 10/09/2020 1740   BILIRUBINUR NEGATIVE 10/09/2020 1740   BILIRUBINUR Negative 05/16/2018  [place] 10/09/2020 1740   PROTEINUR NEGATIVE 10/09/2020 1740   UROBILINOGEN negative 06/09/2014 1043   NITRITE NEGATIVE 10/09/2020 1740   LEUKOCYTESUR NEGATIVE 10/09/2020 1740         Time coordinating discharge: Over 45 minutes  SIGNED: Deatra James, MD, FACP, FHM. [facility],  Please use [email or web address] to Page If 7PM-7AM, please contact  night-coverage [email or web address] [email or web address] 01/19/2021, 11:25 AM

## 2021-01-20 ENCOUNTER — Other Ambulatory Visit: Payer: Self-pay

## 2021-01-20 ENCOUNTER — Telehealth: Payer: Self-pay | Admitting: *Deleted

## 2021-01-20 ENCOUNTER — Encounter: Payer: Self-pay | Admitting: Family Medicine

## 2021-01-20 ENCOUNTER — Telehealth: Payer: Self-pay

## 2021-01-20 DIAGNOSIS — J441 Chronic obstructive pulmonary disease with (acute) exacerbation: Secondary | ICD-10-CM

## 2021-01-20 NOTE — Chronic Care Management (AMB) (Signed)
  Chronic Care Management   Note  01/20/2021 Name: Hannah French MRN: [id number] DOB: Apr 03, 1941  [name] is a 80 y.o. year old female who is a primary care patient of Dettinger, Fransisca Kaufmann, MD. I reached out to [name] by phone today in response to a referral sent by [name]'s PCP, Dr. Warrick Parisian.     [name] was given information about Chronic Care Management services today including:  1. CCM service includes personalized support from designated clinical staff supervised by her physician, including individualized plan of care and coordination with other care providers 2. 24/7 contact phone numbers for assistance for urgent and routine care needs. 3. Service will only be billed when office clinical staff spend 20 minutes or more in a month to coordinate care. 4. Only one practitioner may furnish and bill the service in a calendar month. 5. The patient may stop CCM services at any time (effective at the end of the month) by phone call to the office staff. 6. The patient will be responsible for cost sharing (co-pay) of up to 20% of the service fee (after annual deductible is met).  Patient agreed to services and verbal consent obtained.   Follow up plan: Telephone appointment with care management team member scheduled for:01/27/2021     Care Guide, Embedded Care Coordination [place]  Care Management  Direct Dial: [phone]

## 2021-01-20 NOTE — Telephone Encounter (Signed)
Transition Care Management Follow-up Telephone Call  Date of discharge and from where: 01/19/21 from [facility]  How have you been since you were released from the hospital? Breathing is better, but she is still having to use breathing tx around the clock  Any questions or concerns? No  Items Reviewed:  Did the pt receive and understand the discharge instructions provided? Yes   Medications obtained and verified? Yes   Other? No   Any new allergies since your discharge? No   Dietary orders reviewed? Yes  Do you have support at home? Yes   Home Care and Equipment/Supplies: Were home health services ordered? no Were any new equipment or medical supplies ordered?  Yes: nebulizer  Functional Questionnaire: (I = Independent and D = Dependent) ADLs: I  Bathing/Dressing- I  Meal Prep- I  Eating- I  Maintaining continence- I  Transferring/Ambulation- I  Managing Meds- I  Follow up appointments reviewed:   PCP Hospital f/u appt confirmed? Yes  Scheduled to see Dr Dettinger on 01/24/21 @ 2:10.  [facility] f/u appt confirmed? Yes  Scheduled to see Dr Haroldine Laws next week  Are transportation arrangements needed? No   If their condition worsens, is the pt aware to call PCP or go to the Emergency Dept.? Yes  Was the patient provided with contact information for the PCP's office or ED? Yes  Was to pt encouraged to call back with questions or concerns? Yes

## 2021-01-24 ENCOUNTER — Ambulatory Visit: Payer: Medicare Other | Admitting: Family Medicine

## 2021-01-24 ENCOUNTER — Ambulatory Visit (INDEPENDENT_AMBULATORY_CARE_PROVIDER_SITE_OTHER): Payer: Medicare Other | Admitting: Family Medicine

## 2021-01-24 ENCOUNTER — Encounter: Payer: Self-pay | Admitting: Family Medicine

## 2021-01-24 VITALS — BP 104/53 | HR 93 | Ht 63.0 in | Wt 110.0 lb

## 2021-01-24 DIAGNOSIS — J441 Chronic obstructive pulmonary disease with (acute) exacerbation: Secondary | ICD-10-CM | POA: Diagnosis not present

## 2021-01-24 DIAGNOSIS — R5381 Other malaise: Secondary | ICD-10-CM

## 2021-01-24 DIAGNOSIS — I5043 Acute on chronic combined systolic (congestive) and diastolic (congestive) heart failure: Secondary | ICD-10-CM | POA: Diagnosis not present

## 2021-01-24 DIAGNOSIS — R002 Palpitations: Secondary | ICD-10-CM | POA: Diagnosis not present

## 2021-01-24 DIAGNOSIS — R29898 Other symptoms and signs involving the musculoskeletal system: Secondary | ICD-10-CM | POA: Diagnosis not present

## 2021-01-24 MED ORDER — PREDNISONE 20 MG PO TABS: ORAL_TABLET | ORAL | 0 refills | Status: DC

## 2021-01-24 MED ORDER — METHYLPREDNISOLONE ACETATE 80 MG/ML IJ SUSP
80.0000 mg | Freq: Once | INTRAMUSCULAR | Status: AC
  Administered 2021-01-24: 80 mg via INTRAMUSCULAR

## 2021-01-24 NOTE — Progress Notes (Signed)
BP (!) 104/53   Pulse 93   Ht '5\' 3"'  (1.6 m)   Wt 110 lb (49.9 kg)   SpO2 95%   BMI 19.49 kg/m    Subjective:   Patient ID: Hannah French, female    DOB: [birth year], 80 y.o.   MRN: [id number]  HPI: Hannah French is a 80 y.o. female presenting on 01/24/2021 for Hospitalization Follow-up (COPD exacebation), COPD, and Diabetes   HPI Transition of care telephone call performed by [facility] LPN on [id number]/[id number].  Patient was admitted to [facility] on 01/16/2021 and discharged on 01/19/2021 for COPD and CHF exacerbation.  Transition of care office visit COPD exacerbation and CHF exacerbation Patient does diagnosed with COPD and CHF in the hospital but treated more as COPD even though noticeably more towards CHF.  She is still having a lot of wheezing and tightness and not able to clear.  Her blood sugars are running up more but she has not been taking her [place] consistently.  She did have a Z-Pak but finished that and did not feel like it made much of a difference.  She is feeling somewhat better but not completely.  Patient is having bilateral lower extremity weakness.  She has been having increased difficulty getting up and out of her chair.   Relevant past medical, surgical, family and social history reviewed and updated as indicated. Interim medical history since our last visit reviewed. Allergies and medications reviewed and updated.  Review of Systems  Constitutional: Negative for chills and fever.  Eyes: Negative for visual disturbance.  Respiratory: Negative for chest tightness and shortness of breath.   Cardiovascular: Negative for chest pain and leg swelling.  Musculoskeletal: Positive for gait problem. Negative for back pain.  Skin: Negative for rash.  Neurological: Positive for weakness. Negative for light-headedness and headaches.  Psychiatric/Behavioral: Negative for agitation and behavioral problems.  All other systems reviewed and are  negative.   Per HPI unless specifically indicated above   Allergies as of 01/24/2021      Reactions   Contrast Media [iodinated Diagnostic Agents] Shortness Of Breath   Iohexol Other (See Comments)   PASSED OUT DURING THE TEST   Metformin Swelling   Face and hands became swollen   Statins Swelling, Other (See Comments)   They make the patient hurt "all over" Myalgia, also   Bextra [valdecoxib] Nausea And Vomiting, Swelling   Shut down my kidneys    Cefdinir Other (See Comments)   Pancreatitis   Cozaar [losartan Potassium] Other (See Comments)   dizziness   Gabapentin    Nsaids Other (See Comments)   GI intolerance and pain all over, tolerates ibu   Zetia [ezetimibe] Swelling   Rofecoxib Other (See Comments)   "VIOXX"- "SHUT DOWN MY KIDNEYS"   Sulfa Antibiotics Nausea And Vomiting, Other (See Comments)   Pancreatitis      Medication List       Accurate as of Jan 24, 2021  4:21 PM. If you have any questions, ask your nurse or doctor.        acetaminophen 500 MG tablet Commonly known as: TYLENOL Take 500 mg by mouth every 6 (six) hours as needed for moderate pain.   albuterol 108 (90 Base) MCG/ACT inhaler Commonly known as: VENTOLIN HFA Inhale 2 puffs into the lungs every 6 (six) hours as needed for wheezing or shortness of breath.   Claritin-D 12 Hour 5-120 MG tablet Generic drug: loratadine-pseudoephedrine Take 1 tablet by  mouth 2 (two) times daily as needed for allergies (S).   clopidogrel 75 MG tablet Commonly known as: Plavix Take 1 tablet (75 mg total) by mouth daily.   cyclobenzaprine 10 MG tablet Commonly known as: FLEXERIL TAKE 1 TABLET THREE TIMES DAILY FOR MUSCLE SPASM   dextromethorphan-guaiFENesin 30-600 MG 12hr tablet Commonly known as: MUCINEX DM Take 1 tablet by mouth 2 (two) times daily for 5 days.   Fluticasone-Salmeterol 100-50 MCG/DOSE Aepb Commonly known as: ADVAIR Inhale 1 puff into the lungs 2 (two) times daily.   furosemide 20 MG  tablet Commonly known as: LASIX Take 2 tablets daily. You may take an extra dose for increase weight gain orswelling   glimepiride 2 MG tablet Commonly known as: AMARYL Take 1-2 tablets (2-4 mg total) by mouth daily with breakfast.   ipratropium 0.02 % nebulizer solution Commonly known as: ATROVENT Take 2.5 mLs (0.5 mg total) by nebulization 4 (four) times daily.   levalbuterol 0.63 MG/3ML nebulizer solution Commonly known as: XOPENEX Take 3 mLs (0.63 mg total) by nebulization every 4 (four) hours as needed for wheezing or shortness of breath.   metoprolol succinate 25 MG 24 hr tablet Commonly known as: TOPROL-XL Take 1 tablet (25 mg total) by mouth daily.   nitroGLYCERIN 0.3 MG SL tablet Commonly known as: Nitrostat Place 1 tablet (0.3 mg total) under the tongue every 5 (five) minutes as needed for chest pain.   nitroGLYCERIN 0.2 mg/hr patch Commonly known as: NITRODUR - Dosed in mg/24 hr Place 1 patch (0.2 mg total) onto the skin daily.   Opcon-A 0.027-0.315 % Soln Generic drug: Naphazoline-Pheniramine Place 1 drop into both eyes daily as needed (Burning and itching eyes).   pentoxifylline 400 MG CR tablet Commonly known as: TRENTAL Take 1 tablet (400 mg total) by mouth 3 (three) times daily with meals.   predniSONE 20 MG tablet Commonly known as: DELTASONE Take 3 tabs daily for 1 week, then 2 tabs daily for week 2, then 1 tab daily for week 3. Started by: Worthy Rancher, MD   rivaroxaban 2.5 MG Tabs tablet Commonly known as: XARELTO Take 1 tablet (2.5 mg total) by mouth 2 (two) times daily.   traMADol 50 MG tablet Commonly known as: ULTRAM Take 1 tablet (50 mg total) by mouth every 6 (six) hours as needed for moderate pain.   Tyler Aas FlexTouch 100 UNIT/ML FlexTouch Pen Generic drug: insulin degludec Inject 10 Units into the skin daily.   Vitamin D 50 MCG (2000 UT) tablet Take 4,000 Units by mouth daily.            Durable Medical Equipment  (From  admission, onward)         Start     Ordered   01/24/21 0000  For home use only DME Other see comment       Comments: Bilateral lower extremity weakness and increased fall risk and deconditioning  Lift chair  Question:  Length of Need  Answer:  Lifetime   01/24/21 1619           Objective:   BP (!) 104/53   Pulse 93   Ht '5\' 3"'  (1.6 m)   Wt 110 lb (49.9 kg)   SpO2 95%   BMI 19.49 kg/m   Wt Readings from Last 3 Encounters:  01/24/21 110 lb (49.9 kg)  01/19/21 110 lb 12.8 oz (50.3 kg)  01/12/21 113 lb 6.4 oz (51.4 kg)    Physical Exam Vitals and nursing note reviewed.  Constitutional:      General: She is not in acute distress.    Appearance: She is well-developed. She is not diaphoretic.  Eyes:     Conjunctiva/sclera: Conjunctivae normal.     Pupils: Pupils are equal, round, and reactive to light.  Cardiovascular:     Rate and Rhythm: Normal rate and regular rhythm.     Heart sounds: Normal heart sounds. No murmur heard.   Pulmonary:     Effort: Pulmonary effort is normal. No respiratory distress.     Breath sounds: Examination of the right-upper field reveals decreased breath sounds and wheezing. Examination of the left-upper field reveals decreased breath sounds and wheezing. Examination of the right-middle field reveals decreased breath sounds and wheezing. Examination of the left-middle field reveals decreased breath sounds and wheezing. Examination of the right-lower field reveals decreased breath sounds and wheezing. Examination of the left-lower field reveals decreased breath sounds and wheezing. Decreased breath sounds and wheezing present. No rhonchi or rales.  Chest:     Chest wall: No tenderness.  Musculoskeletal:        General: No tenderness. Normal range of motion.  Skin:    General: Skin is warm and dry.     Findings: No rash.  Neurological:     Mental Status: She is alert and oriented to person, place, and time.     Coordination: Coordination  normal.  Psychiatric:        Behavior: Behavior normal.       Assessment & Plan:   Problem List Items Addressed This Visit   None   Visit Diagnoses    COPD exacerbation ([place])    -  Primary   Relevant Medications   predniSONE (DELTASONE) 20 MG tablet   Other Relevant Orders   CBC with Differential/Platelet   CMP14+EGFR   Acute on chronic combined systolic and diastolic congestive heart failure (HCC)       Relevant Orders   CBC with Differential/Platelet   CMP14+EGFR   Physical deconditioning       Relevant Orders   For home use only DME Other see comment   Weakness of both lower extremities       Relevant Orders   For home use only DME Other see comment      Patient is still smoking, recommended she called her pulmonologist and get it rechecked with them.  It is worsening and seems like not doing as well.  She does not have any fluid or swelling or weight gain. Follow up plan: Return if symptoms worsen or fail to improve, for 3 to 4 weeks COPD recheck.  Counseling provided for all of the vaccine components Orders Placed This Encounter  Procedures  . For home use only DME Other see comment  . CBC with Differential/Platelet  . Ocheyedan Jemiah [place], MD [facility] 01/24/2021, 4:21 PM

## 2021-01-24 NOTE — Addendum Note (Signed)
Addended by: Ladean Raya on: 01/24/2021 04:29 PM   Modules accepted: Orders

## 2021-01-25 ENCOUNTER — Other Ambulatory Visit: Payer: Self-pay | Admitting: Family

## 2021-01-26 ENCOUNTER — Encounter: Payer: Self-pay | Admitting: Family Medicine

## 2021-01-26 ENCOUNTER — Encounter (HOSPITAL_COMMUNITY): Payer: Self-pay

## 2021-01-26 ENCOUNTER — Other Ambulatory Visit: Payer: Self-pay | Admitting: Orthopedic Surgery

## 2021-01-26 ENCOUNTER — Other Ambulatory Visit: Payer: Self-pay | Admitting: Family Medicine

## 2021-01-26 MED ORDER — INSULIN ASPART 100 UNIT/ML IJ SOLN: 5.0000 [IU] | Freq: Three times a day (TID) | INTRAMUSCULAR | 1 refills | Status: DC

## 2021-01-27 ENCOUNTER — Telehealth: Payer: Self-pay

## 2021-01-27 ENCOUNTER — Telehealth: Payer: Medicare Other | Admitting: *Deleted

## 2021-01-27 ENCOUNTER — Other Ambulatory Visit: Payer: Self-pay

## 2021-01-27 ENCOUNTER — Telehealth: Payer: Self-pay | Admitting: *Deleted

## 2021-01-27 ENCOUNTER — Other Ambulatory Visit: Payer: Medicare Other

## 2021-01-27 DIAGNOSIS — I5043 Acute on chronic combined systolic (congestive) and diastolic (congestive) heart failure: Secondary | ICD-10-CM | POA: Diagnosis not present

## 2021-01-27 DIAGNOSIS — J441 Chronic obstructive pulmonary disease with (acute) exacerbation: Secondary | ICD-10-CM | POA: Diagnosis not present

## 2021-01-27 NOTE — Telephone Encounter (Signed)
01/27/2021  Spoke with Ms Randol Kern by telephone, but she was unavailable to complete her initial telephone visit a that time and requested that we reschedule.   Follow-up: Patient has been scheduled for an initial telephone visit with RNCM on 01/31/21.  Chong Sicilian, BSN, RN-BC Embedded Chronic Care Manager [facility] / [place] Management Direct Dial: [phone]

## 2021-01-27 NOTE — Telephone Encounter (Signed)
Pts son called requesting to speak with Almyra Free regarding pts medicine list for Assistance program.. Please call [phone] [place]ell Level)

## 2021-01-28 DIAGNOSIS — R1084 Generalized abdominal pain: Secondary | ICD-10-CM | POA: Diagnosis not present

## 2021-01-28 DIAGNOSIS — R000 Tachycardia, unspecified: Secondary | ICD-10-CM | POA: Diagnosis not present

## 2021-01-28 LAB — CMP14+EGFR
ALT: 21 IU/L (ref 0–32)
AST: 21 IU/L (ref 0–40)
Albumin/Globulin Ratio: 2 (ref 1.2–2.2)
Albumin: 4.5 g/dL (ref 3.7–4.7)
Alkaline Phosphatase: 68 IU/L (ref 44–121)
BUN/Creatinine Ratio: 28 (ref 12–28)
BUN: 44 mg/dL — ABNORMAL HIGH (ref 8–27)
Bilirubin Total: 0.5 mg/dL (ref 0.0–1.2)
CO2: 26 mmol/L (ref 20–29)
Calcium: 9.8 mg/dL (ref 8.7–10.3)
Chloride: 91 mmol/L — ABNORMAL LOW (ref 96–106)
Creatinine, Ser: 1.57 mg/dL — ABNORMAL HIGH (ref 0.57–1.00)
Globulin, Total: 2.2 g/dL (ref 1.5–4.5)
Glucose: 407 mg/dL (ref 65–99)
Potassium: 4.9 mmol/L (ref 3.5–5.2)
Sodium: 134 mmol/L (ref 134–144)
Total Protein: 6.7 g/dL (ref 6.0–8.5)
eGFR: 33 mL/min/{1.73_m2} — ABNORMAL LOW (ref >59)

## 2021-01-28 LAB — CBC WITH DIFFERENTIAL/PLATELET
Basophils Absolute: 0 10*3/uL (ref 0.0–0.2)
Basos: 0 %
EOS (ABSOLUTE): 0 10*3/uL (ref 0.0–0.4)
Eos: 0 %
Hematocrit: 34.6 % (ref 34.0–46.6)
Hemoglobin: 11.4 g/dL (ref 11.1–15.9)
Immature Grans (Abs): 0.1 10*3/uL (ref 0.0–0.1)
Immature Granulocytes: 1 %
Lymphocytes Absolute: 0.7 10*3/uL (ref 0.7–3.1)
Lymphs: 5 %
MCH: 33.8 pg — ABNORMAL HIGH (ref 26.6–33.0)
MCHC: 32.9 g/dL (ref 31.5–35.7)
MCV: 103 fL — ABNORMAL HIGH (ref 79–97)
Monocytes Absolute: 0.1 10*3/uL (ref 0.1–0.9)
Monocytes: 1 %
Neutrophils Absolute: 12.7 10*3/uL — ABNORMAL HIGH (ref 1.4–7.0)
Neutrophils: 93 %
Platelets: 309 10*3/uL (ref 150–450)
RBC: 3.37 x10E6/uL — ABNORMAL LOW (ref 3.77–5.28)
RDW: 18.2 % — ABNORMAL HIGH (ref 11.7–15.4)
WBC: 13.7 10*3/uL — ABNORMAL HIGH (ref 3.4–10.8)

## 2021-01-31 ENCOUNTER — Ambulatory Visit (INDEPENDENT_AMBULATORY_CARE_PROVIDER_SITE_OTHER): Payer: Medicare Other | Admitting: *Deleted

## 2021-01-31 ENCOUNTER — Encounter: Payer: Self-pay | Admitting: Family Medicine

## 2021-01-31 DIAGNOSIS — I5043 Acute on chronic combined systolic (congestive) and diastolic (congestive) heart failure: Secondary | ICD-10-CM

## 2021-01-31 DIAGNOSIS — J441 Chronic obstructive pulmonary disease with (acute) exacerbation: Secondary | ICD-10-CM

## 2021-01-31 NOTE — Progress Notes (Signed)
@Patient  ID: Hannah French, female    DOB: [birth year], 80 y.o.   MRN: [id number]  Chief Complaint  Patient presents with  . Follow-up    COPD-  SOB,coughing up green and brown mucus.     Referring provider: Dettinger, Fransisca Kaufmann, MD  HPI: 80 year old female, current smoker. PMH significant for CHF, COPD, type 2 diabetes, CKD stage 3, bladder cancer. Patient of Dr. Melvyn Novas, seen for initial consult back in September 2020. Follow up was as needed. She was recently admitted for acute hypoxic respiratory felt to be related  to combined systolic and diastolic HF and COPD exacerbation from 01/16/21 - 01/19/21. COPD exacerbations was treated with azithromycin, IV steriods, bronchodilators and mucolytics. Spirometry in September 2020 showed severe COPD, FEV1 0.96L.   02/01/2021 Patient presents today for hospital follow-up. Accompanied by her family. He is feeling well. She still has a productive cough with green-brown mucus. Since being discharged from hospital she has only been using her Advair once daily in the morning. She has been compliant with Atrovent nebulizer four times a day. Family states that Levalbuterol seems to works better for her cough. She would like to try and get away from breathing treatments. She is taking 20-40mg  lasix daily, she will take an additional tablet when experiencing more leg swelling. She does not want to repeat PFTs. She is not ready to quit smoking.     Allergies  Allergen Reactions  . Contrast Media [Iodinated Diagnostic Agents] Shortness Of Breath  . Iohexol Other (See Comments)    PASSED OUT DURING THE TEST    . Metformin Swelling    Face and hands became swollen  . Statins Swelling and Other (See Comments)    They make the patient hurt "all over" Myalgia, also  . Bextra [Valdecoxib] Nausea And Vomiting and Swelling    Shut down my kidneys   . Cefdinir Other (See Comments)    Pancreatitis   . Cozaar [Losartan Potassium] Other (See Comments)     dizziness  . Gabapentin   . Nsaids Other (See Comments)    GI intolerance and pain all over, tolerates ibu   . Zetia [Ezetimibe] Swelling  . Rofecoxib Other (See Comments)    "VIOXX"- "SHUT DOWN MY KIDNEYS"  . Sulfa Antibiotics Nausea And Vomiting and Other (See Comments)    Pancreatitis    Immunization History  Administered Date(s) Administered  . Fluad Quad(high Dose 65+) 06/14/2019, 07/22/2020  . Influenza Inj Mdck Quad Pf 06/14/2019  . Influenza, High Dose Seasonal PF 08/14/2017  . Influenza,inj,Quad PF,6+ Mos 08/06/2013, 07/20/2014, 07/30/2015  . Influenza-Unspecified 07/05/2018  . Moderna Sars-Covid-2 Vaccination 11/06/2019, 12/05/2019, 06/16/2020  . Pneumococcal Conjugate-13 09/09/2020  . Pneumococcal Polysaccharide-23 08/08/2019  . Tdap 03/17/2011  . Zoster 05/12/2010  . Zoster Recombinat (Shingrix) 11/14/2017, 04/19/2018    Past Medical History:  Diagnosis Date  . Abdominal bruit   . Bladder cancer ([place])   . CAD (coronary artery disease)    a. nonobstructive by cath 08/2018.  . Carotid bruit   . Chronic combined systolic and diastolic CHF (congestive heart failure) ([place])   . CKD (chronic kidney disease), stage II   . Congestive heart failure (CHF) ([place])   . Diabetes mellitus    Type II  . Dyslipidemia   . Heart murmur   . Mild pulmonary hypertension ([place])   . Mitral valve prolapse    a. not seen on most recent echoes. Mild MR now.  Marland Kitchen NICM (nonischemic cardiomyopathy) ([place])   .  Osteoporosis   . Pancreatitis, acute   . Pseudogout   . PVD (peripheral vascular disease) ([place])    a. 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  . Sinus tachycardia   . Tobacco abuse     Tobacco History: Social History   Tobacco Use  Smoking Status Current Every Day Smoker  . Packs/day: 1.00  . Years: 50.00  . Pack years: 50.00  . Types: Cigarettes  Smokeless Tobacco Never Used   Ready to quit: Not Answered Counseling given:  Not Answered   Outpatient Medications Prior to Visit  Medication Sig Dispense Refill  . acetaminophen (TYLENOL) 500 MG tablet Take 500 mg by mouth every 6 (six) hours as needed for moderate pain.    Marland Kitchen albuterol (VENTOLIN HFA) 108 (90 Base) MCG/ACT inhaler Inhale 2 puffs into the lungs every 6 (six) hours as needed for wheezing or shortness of breath. 18 g 5  . Cholecalciferol (VITAMIN D) 50 MCG (2000 UT) tablet Take 4,000 Units by mouth daily.     . clopidogrel (PLAVIX) 75 MG tablet Take 1 tablet (75 mg total) by mouth daily. 90 tablet 2  . cyclobenzaprine (FLEXERIL) 10 MG tablet TAKE 1 TABLET THREE TIMES DAILY FOR MUSCLE SPASM 30 tablet 0  . Fluticasone-Salmeterol (ADVAIR) 100-50 MCG/DOSE AEPB Inhale 1 puff into the lungs 2 (two) times daily. 1 each 3  . furosemide (LASIX) 20 MG tablet Take 2 tablets daily. You may take an extra dose for increase weight gain orswelling 180 tablet 3  . glimepiride (AMARYL) 2 MG tablet Take 1-2 tablets (2-4 mg total) by mouth daily with breakfast. 180 tablet 3  . insulin aspart (NOVOLOG) 100 UNIT/ML injection Inject 5-20 Units into the skin 3 (three) times daily before meals. 10 mL 1  . insulin degludec (TRESIBA FLEXTOUCH) 100 UNIT/ML FlexTouch Pen Inject 10 Units into the skin daily. 9 mL 3  . levalbuterol (XOPENEX) 0.63 MG/3ML nebulizer solution Take 3 mLs (0.63 mg total) by nebulization every 4 (four) hours as needed for wheezing or shortness of breath. 3 mL 2  . loratadine-pseudoephedrine (CLARITIN-D 12 HOUR) 5-120 MG tablet Take 1 tablet by mouth 2 (two) times daily as needed for allergies (S). 60 tablet 0  . metoprolol succinate (TOPROL-XL) 25 MG 24 hr tablet Take 1 tablet (25 mg total) by mouth daily. 90 tablet 3  . Naphazoline-Pheniramine (OPCON-A) 0.027-0.315 % SOLN Place 1 drop into both eyes daily as needed (Burning and itching eyes).    . nitroGLYCERIN (NITRODUR - DOSED IN MG/24 HR) 0.2 mg/hr patch Place 1 patch (0.2 mg total) onto the skin daily. 30  patch 12  . nitroGLYCERIN (NITROSTAT) 0.3 MG SL tablet Place 1 tablet (0.3 mg total) under the tongue every 5 (five) minutes as needed for chest pain. 100 tablet 3  . pentoxifylline (TRENTAL) 400 MG CR tablet Take 1 tablet (400 mg total) by mouth 3 (three) times daily with meals. 90 tablet 3  . predniSONE (DELTASONE) 20 MG tablet Take 3 tabs daily for 1 week, then 2 tabs daily for week 2, then 1 tab daily for week 3. 42 tablet 0  . rivaroxaban (XARELTO) 2.5 MG TABS tablet Take 1 tablet (2.5 mg total) by mouth 2 (two) times daily. 60 tablet 11  . traMADol (ULTRAM) 50 MG tablet TAKE 1 TABLET EVERY 6 HOURS AS NEEDED FOR MODERATE PAIN 40 tablet 0  . ipratropium (ATROVENT) 0.02 % nebulizer solution Take 2.5 mLs (0.5 mg total) by nebulization 4 (four)  times daily. 75 mL 12   No facility-administered medications prior to visit.   Review of Systems  Review of Systems  Constitutional: Negative.   HENT: Negative.   Respiratory: Positive for cough. Negative for chest tightness, shortness of breath and wheezing.   Cardiovascular: Positive for leg swelling.    Physical Exam  BP 120/62 (BP Location: Left Arm, Patient Position: Sitting, Cuff Size: Normal)   Pulse (!) 105   Temp 98.4 F (36.9 C) (Temporal)   Ht 4\' 11"  (1.499 m)   Wt 113 lb (51.3 kg)   SpO2 95%   BMI 22.82 kg/m  Physical Exam Constitutional:      Appearance: Normal appearance.  HENT:     Head: Normocephalic and atraumatic.     Mouth/Throat:     Comments: Deferred d/t masking Cardiovascular:     Rate and Rhythm: Normal rate and regular rhythm.  Pulmonary:     Breath sounds: Wheezing and rhonchi present.  Musculoskeletal:        General: Normal range of motion.  Skin:    General: Skin is warm and dry.  Neurological:     General: No focal deficit present.     Mental Status: She is alert and oriented to person, place, and time. Mental status is at baseline.  Psychiatric:        Mood and Affect: Mood normal.         Behavior: Behavior normal.        Thought Content: Thought content normal.        Judgment: Judgment normal.      Lab Results:  CBC    Component Value Date/Time   WBC 13.7 (H) 01/27/2021 1518   WBC 9.2 01/17/2021 0436   RBC 3.37 (L) 01/27/2021 1518   RBC 3.01 (L) 01/17/2021 0436   HGB 11.4 01/27/2021 1518   HCT 34.6 01/27/2021 1518   PLT 309 01/27/2021 1518   MCV 103 (H) 01/27/2021 1518   MCH 33.8 (H) 01/27/2021 1518   MCH 35.2 (H) 01/17/2021 0436   MCHC 32.9 01/27/2021 1518   MCHC 32.6 01/17/2021 0436   RDW 18.2 (H) 01/27/2021 1518   LYMPHSABS 0.7 01/27/2021 1518   MONOABS 0.9 01/16/2021 2255   EOSABS 0.0 01/27/2021 1518   BASOSABS 0.0 01/27/2021 1518    BMET    Component Value Date/Time   NA 134 01/27/2021 1518   K 4.9 01/27/2021 1518   CL 91 (L) 01/27/2021 1518   CO2 26 01/27/2021 1518   GLUCOSE 407 (HH) 01/27/2021 1518   GLUCOSE 303 (H) 01/19/2021 0522   BUN 44 (H) 01/27/2021 1518   CREATININE 1.57 (H) 01/27/2021 1518   CREATININE 0.67 02/06/2013 0932   CALCIUM 9.8 01/27/2021 1518   GFRNONAA 34 (L) 01/19/2021 0522   GFRNONAA 88 02/06/2013 0932   GFRAA 44 (L) 09/09/2020 1131   GFRAA >89 02/06/2013 0932    BNP    Component Value Date/Time   BNP 288.0 (H) 01/19/2021 0522    ProBNP    Component Value Date/Time   PROBNP 4,124 (H) 10/04/2018 1257    Imaging: CT ABDOMEN PELVIS WO CONTRAST  Result Date: 01/14/2021 CLINICAL DATA:  Upper abdominal pain EXAM: CT ABDOMEN AND PELVIS WITHOUT CONTRAST TECHNIQUE: Multidetector CT imaging of the abdomen and pelvis was performed following the standard protocol without IV contrast. COMPARISON:  02/29/2016 FINDINGS: Lower chest: No acute abnormality. Hepatobiliary: No focal liver abnormality is seen. Status post cholecystectomy. No biliary dilatation. Pancreas: Unremarkable. No pancreatic ductal dilatation  or surrounding inflammatory changes. Spleen: Normal in size without focal abnormality. Adrenals/Urinary Tract:  Adrenal glands are within normal limits. Kidneys show no renal calculi or urinary tract obstructive changes. Bladder is partially distended Stomach/Bowel: Mild diverticular change of the colon is noted without evidence of diverticulitis. Appendix has been surgically removed. Small bowel and stomach are within normal limits. Vascular/Lymphatic: Aortic atherosclerosis. No enlarged abdominal or pelvic lymph nodes. Bilateral common iliac artery stents are seen. Reproductive: Status post hysterectomy. No adnexal masses. Other: No abdominal wall hernia or abnormality. No abdominopelvic ascites. Musculoskeletal: No acute or significant osseous findings. 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 Normal appearing pancreas without evidence of inflammatory change. No acute abnormality noted. Electronically Signed   By: Inez Catalina M.D.   On: 01/14/2021 02:29   DG Chest 2 View  Result Date: 01/19/2021 CLINICAL DATA:  Shortness of breath. EXAM: CHEST - 2 VIEW COMPARISON:  01/16/2021.  09/03/2018. FINDINGS: Monitor device noted over the left chest in stable position. Heart size normal. No focal infiltrate. No pleural effusion or pneumothorax. Thoracic spine osteopenia degenerative change. Old lower thoracic vertebral body compression fracture again noted. No interim change. No acute bony abnormality identified. IMPRESSION: No active cardiopulmonary disease. Electronically Signed   By: Marcello Moores  Register   On: 01/19/2021 11:40   DG Chest 2 View  Result Date: 01/13/2021 CLINICAL DATA:  Shortness of breath EXAM: CHEST - 2 VIEW COMPARISON:  03/10/2019 FINDINGS: Mild hyperinflation. Heart is upper limits normal in size. Aortic atherosclerosis. No confluent opacities or effusions. No acute bony abnormality. IMPRESSION: No active cardiopulmonary disease. Electronically Signed   By: Rolm Baptise M.D.   On: 01/13/2021 22:38   NM Pulmonary Perfusion  Result Date: 01/17/2021 CLINICAL DATA:  Shortness of breath EXAM:  NUCLEAR MEDICINE PERFUSION LUNG SCAN TECHNIQUE: Perfusion images were obtained in multiple projections after intravenous injection of radiopharmaceutical. Views: Anterior, posterior, left lateral, right lateral, RPO, LPO, RAO, LAO RADIOPHARMACEUTICALS:  3.97 mCi Tc-58m MAA IV COMPARISON:  Chest radiograph January 16, 2021 FINDINGS: There are scattered subsegmental perfusion defects. No segmental level perfusion defect is evident. IMPRESSION: Scattered subsegmental perfusion defects suggesting a low probability of pulmonary embolus. Note that there is underlying emphysematous change by radiography. If there remains concern for potential pulmonary embolus, CT angiogram chest to further evaluate would be a reasonable consideration given that there are scattered subsegmental perfusion defects. If CT angiogram chest is contraindicated in this case, bilateral lower extremity duplex ultrasound would be a reasonable consideration as well. Electronically Signed   By: Lowella Grip III M.D.   On: 01/17/2021 12:39   DG Chest Port 1 View  Result Date: 01/16/2021 CLINICAL DATA:  Shortness of breath EXAM: PORTABLE CHEST 1 VIEW COMPARISON:  January 13, 2021 FINDINGS: The heart size and mediastinal contours are within normal limits. Chronic parenchymal lung changes with mild hyperinflation. Loop recorder device overlies the left chest. No focal consolidation. No pleural effusion. No pneumothorax. The visualized skeletal structures are unchanged. IMPRESSION: No acute cardiopulmonary process. Electronically Signed   By: Dahlia Bailiff MD   On: 01/16/2021 23:28     Assessment & Plan:   Stage 3 severe COPD by GOLD classification ([place]) - Spirometry in September 2020 showed severe COPD, FEV1 0.96%   - Trial Trelegy 100 one puff daily (samples given and RX sent) - Sending in RX Doxycycline 100mg  BID x 7 days for persistent bronchitis symptoms - Continue prednisone taper as previously prescribed - Needs 6MWT to assess for  oxygen need - Patient  is no ready to quit smoking, she declined full PFTs  - FU in 4 weeks   Chronic combined systolic and diastolic CHF (congestive heart failure) ([place]) - Appears euvolemic on exam, trace BLE pedal edema at best - Continue 40mg  lasix daily, may take additional 20mg  as needed for increased swelling    Martyn Ehrich, NP 02/01/2021

## 2021-02-01 ENCOUNTER — Encounter: Payer: Self-pay | Admitting: Orthopedic Surgery

## 2021-02-01 ENCOUNTER — Other Ambulatory Visit: Payer: Self-pay

## 2021-02-01 ENCOUNTER — Ambulatory Visit: Payer: Medicare Other | Admitting: Primary Care

## 2021-02-01 ENCOUNTER — Ambulatory Visit: Payer: Medicare Other | Admitting: Orthopedic Surgery

## 2021-02-01 ENCOUNTER — Encounter: Payer: Self-pay | Admitting: Primary Care

## 2021-02-01 ENCOUNTER — Telehealth: Payer: Self-pay

## 2021-02-01 VITALS — BP 120/62 | HR 105 | Temp 98.4°F | Ht 59.0 in | Wt 113.0 lb

## 2021-02-01 DIAGNOSIS — I96 Gangrene, not elsewhere classified: Secondary | ICD-10-CM

## 2021-02-01 DIAGNOSIS — J471 Bronchiectasis with (acute) exacerbation: Secondary | ICD-10-CM

## 2021-02-01 DIAGNOSIS — I872 Venous insufficiency (chronic) (peripheral): Secondary | ICD-10-CM

## 2021-02-01 DIAGNOSIS — I5042 Chronic combined systolic (congestive) and diastolic (congestive) heart failure: Secondary | ICD-10-CM

## 2021-02-01 DIAGNOSIS — J441 Chronic obstructive pulmonary disease with (acute) exacerbation: Secondary | ICD-10-CM

## 2021-02-01 MED ORDER — TRELEGY ELLIPTA 100-62.5-25 MCG/INH IN AEPB: 1.0000 | INHALATION_SPRAY | Freq: Every day | RESPIRATORY_TRACT | 5 refills | Status: DC

## 2021-02-01 MED ORDER — TRELEGY ELLIPTA 100-62.5-25 MCG/INH IN AEPB: 100.0000 ug | INHALATION_SPRAY | Freq: Every day | RESPIRATORY_TRACT | 0 refills | Status: DC

## 2021-02-01 MED ORDER — ANORO ELLIPTA 62.5-25 MCG/INH IN AEPB: 1.0000 | INHALATION_SPRAY | Freq: Every day | RESPIRATORY_TRACT | 0 refills | Status: DC

## 2021-02-01 MED ORDER — DOXYCYCLINE HYCLATE 100 MG PO TABS: 100.0000 mg | ORAL_TABLET | Freq: Two times a day (BID) | ORAL | 0 refills | Status: DC

## 2021-02-01 NOTE — Addendum Note (Signed)
Addended by: Carlisle Cater on: 02/01/2021 03:05 PM   Modules accepted: Orders

## 2021-02-01 NOTE — Addendum Note (Signed)
Addended by: Carlisle Cater on: 02/01/2021 04:24 PM   Modules accepted: Orders

## 2021-02-01 NOTE — Addendum Note (Signed)
Addended by: Carlisle Cater on: 02/01/2021 02:59 PM   Modules accepted: Orders

## 2021-02-01 NOTE — Addendum Note (Signed)
Addended by: Carlisle Cater on: 02/01/2021 03:22 PM   Modules accepted: Orders

## 2021-02-01 NOTE — Patient Instructions (Addendum)
Recommendation: - Stop Advair and atrovent  - Start Trelegy 100- take one puff daily in morning EVERYDAY (rinse mouth after use) - You can use levalbuterol nebulizer every 6 hours as needed for breakthrough shortness of breath or wheezing  - Take 40mg  lasix daily  (60mg  when having more swelling) - Finish prednisone as previously prescribed   Orders: - 6 minute walk test (please order)  RX: - Doxycycline 100mg  twice daily x 1 week (take with food and wear sunscreen)  Follow-up: - 4 weeks with Beth / 6 minute walk test prior      COPD and Physical Activity Chronic obstructive pulmonary disease (COPD) is a long-term (chronic) condition that affects the lungs. COPD is a general term that can be used to describe many different lung problems that cause lung swelling (inflammation) and limit airflow, including chronic bronchitis and emphysema. The main symptom of COPD is shortness of breath, which makes it harder to do even simple tasks. This can also make it harder to exercise and be active. Talk with your health care provider about treatments to help you breathe better and actions you can take to prevent breathing problems during physical activity. What are the benefits of exercising with COPD? Exercising regularly is an important part of a healthy lifestyle. You can still exercise and do physical activities even though you have COPD. Exercise and physical activity improve your shortness of breath by increasing blood flow (circulation). This causes your heart to pump more oxygen through your body. Moderate exercise can improve your:  Oxygen use.  Energy level.  Shortness of breath.  Strength in your breathing muscles.  Heart health.  Sleep.  Self-esteem and feelings of self-worth.  Depression, stress, and anxiety levels. Exercise can benefit everyone with COPD. The severity of your disease may affect how hard you can exercise, especially at first, but everyone can benefit. Talk  with your health care provider about how much exercise is safe for you, and which activities and exercises are safe for you.   What actions can I take to prevent breathing problems during physical activity?  Sign up for a pulmonary rehabilitation program. This type of program may include: ? Education about lung diseases. ? Exercise classes that teach you how to exercise and be more active while improving your breathing. This usually involves:  Exercise using your lower extremities, such as a stationary bicycle.  About 30 minutes of exercise, 2 to 5 times per week, for 6 to 12 weeks  Strength training, such as push ups or leg lifts. ? Nutrition education. ? Group classes in which you can talk with others who also have COPD and learn ways to manage stress.  If you use an oxygen tank, you should use it while you exercise. Work with your health care provider to adjust your oxygen for your physical activity. Your resting flow rate is different from your flow rate during physical activity.  While you are exercising: ? Take slow breaths. ? Pace yourself and do not try to go too fast. ? Purse your lips while breathing out. Pursing your lips is similar to a kissing or whistling position. ? If doing exercise that uses a quick burst of effort, such as weight lifting:  Breathe in before starting the exercise.  Breathe out during the hardest part of the exercise (such as raising the weights). Where to find support You can find support for exercising with COPD from:  Your health care provider.  A pulmonary rehabilitation program.  Your  local health department or community health programs.  Support groups, online or in-person. Your health care provider may be able to recommend support groups. Where to find more information You can find more information about exercising with COPD from:  American Lung Association: ClassInsider.se.  COPD Foundation: https://www.rivera.net/. Contact a health care provider  if:  Your symptoms get worse.  You have chest pain.  You have nausea.  You have a fever.  You have trouble talking or catching your breath.  You want to start a new exercise program or a new activity. Summary  COPD is a general term that can be used to describe many different lung problems that cause lung swelling (inflammation) and limit airflow. This includes chronic bronchitis and emphysema.  Exercise and physical activity improve your shortness of breath by increasing blood flow (circulation). This causes your heart to provide more oxygen to your body.  Contact your health care provider before starting any exercise program or new activity. Ask your health care provider what exercises and activities are safe for you. This information is not intended to replace advice given to you by your health care provider. Make sure you discuss any questions you have with your health care provider. Document Revised: 01/01/2019 Document Reviewed: 10/04/2017 Elsevier Patient Education  2021 [employer].

## 2021-02-01 NOTE — Assessment & Plan Note (Signed)
-   Appears euvolemic on exam, trace BLE pedal edema at best - Continue 40mg  lasix daily, may take additional 20mg  as needed for increased swelling

## 2021-02-01 NOTE — Telephone Encounter (Signed)
contacted the patient , she bought her nebulizer machine on her own no one supplies it. The patient states her son picked up a mask on the way home from picking up her medications, The face mask is no longer needed. Will be canceling the order now.   She bought her nebulizer machine from [employer] and homecare.

## 2021-02-01 NOTE — Assessment & Plan Note (Addendum)
-   Spirometry in September 2020 showed severe COPD, FEV1 0.96%   - Trial Trelegy 100 one puff daily (samples given and RX sent) - Sending in RX Doxycycline 100mg  BID x 7 days for persistent bronchitis symptoms - Continue prednisone taper as previously prescribed - Needs 6MWT to assess for oxygen need - Patient is no ready to quit smoking, she declined full PFTs  - FU in 4 weeks

## 2021-02-01 NOTE — Telephone Encounter (Signed)
-----   Message from Satira Anis sent at 02/01/2021 12:58 PM EDT ----- Regarding: DME Hey.    For the Nebulizer DME order for this patient - who is the DME?  It isn't noted anywhere on the order, nor has the [facility] note been updated.   Thanks,  [employer]

## 2021-02-01 NOTE — Progress Notes (Signed)
Office Visit Note   Patient: Hannah French           Date of Birth: 1941/07/31           MRN: [id number] Visit Date: 02/01/2021              Requested by: Dettinger, Fransisca Kaufmann, MD [address] [place],  [place] [address] PCP: Dettinger, Fransisca Kaufmann, MD  Chief Complaint  Patient presents with  . Left Foot - Follow-up  . Right Foot - Follow-up      HPI: Patient is an 80 year old woman who presents in follow-up for dry gangrene toes of the left foot she states she has noticed some purple discolorations of the toe over the right foot.  Patient states that her feet are looking good she has no pain and she feels much better she is currently using Trental and the nitroglycerin patch on the left foot.  She currently smokes 1 pack/day.  Assessment & Plan: Visit Diagnoses:  1. Gangrene of toe of left foot (HCC)   2. Venous stasis dermatitis of both lower extremities     Plan: We will have her use half a nitroglycerin patch on each foot continue with the Trental she was placed in a size medium 2030 compression stocking knee-high she will wear this daily to help with the venous swelling and improve microcirculation.  Follow-Up Instructions: Return in about 4 weeks (around 03/01/2021).   Ortho Exam  Patient is alert, oriented, no adenopathy, well-dressed, normal affect, normal respiratory effort. Examination patient's both feet are cool to the touch the dry ischemic changes to the left foot are much improved she does have some purple mottling of the toes of the right foot.  Both feet are cool to the touch she does have venous stasis swelling with pitting edema worse on the right than the left leg.  Right calf is 29 cm in circumference left calf is 27 cm in circumference.  Patient was placed in a size medium 20-30 compression stocking she states this felt comfortable.  Imaging: No results found. No images are attached to the encounter.  Labs: Lab Results  Component Value Date   HGBA1C  9.8 (H) 01/17/2021   HGBA1C 8.1 (H) 12/09/2020   HGBA1C 8.3 (H) 09/09/2020   LABURIC 9.0 (H) 10/08/2020     Lab Results  Component Value Date   ALBUMIN 4.5 01/27/2021   ALBUMIN 3.7 01/19/2021   ALBUMIN 3.8 01/17/2021    Lab Results  Component Value Date   MG 2.4 01/17/2021   MG 1.7 08/13/2018   Lab Results  Component Value Date   VD25OH 103.0 (H) 02/17/2020   VD25OH 54.9 08/08/2019   VD25OH 57.2 01/29/2019    No results found for: PREALBUMIN CBC EXTENDED Latest Ref Rng & Units 01/27/2021 01/17/2021 01/16/2021  WBC 3.4 - 10.8 x10E3/uL 13.7(H) 9.2 11.0(H)  RBC 3.77 - 5.28 x10E6/uL 3.37(L) 3.01(L) 3.35(L)  HGB 11.1 - 15.9 g/dL 11.4 10.6(L) 11.6(L)  HCT 34.0 - 46.6 % 34.6 32.5(L) 35.7(L)  PLT 150 - 450 x10E3/uL 309 208 225  NEUTROABS 1.4 - 7.0 x10E3/uL 12.7(H) - 8.1(H)  LYMPHSABS 0.7 - 3.1 x10E3/uL 0.7 - 1.8     There is no height or weight on file to calculate BMI.  Orders:  No orders of the defined types were placed in this encounter.  No orders of the defined types were placed in this encounter.    Procedures: No procedures performed  Clinical Data: No additional  findings.  ROS:  All other systems negative, except as noted in the HPI. Review of Systems  Objective: Vital Signs: There were no vitals taken for this visit.  Specialty Comments:  No specialty comments available.  PMFS History: Patient Active Problem List   Diagnosis Date Noted  . Acute respiratory failure with hypoxia ([place]) 01/17/2021  . Elevated MCV 01/17/2021  . Leukocytosis 01/17/2021  . CKD (chronic kidney disease), stage III ([place]) 01/17/2021  . Hyperglycemia due to diabetes mellitus ([place]) 01/17/2021  . Skin abscess 01/15/2020  . Depression, recurrent ([place]) 08/08/2019  . Cigarette smoker 06/19/2019  . Vitamin D deficiency 01/29/2019  . Dizziness 12/03/2018  . Chronic combined systolic and diastolic CHF (congestive heart failure) ([place]) 12/03/2018  . Coronary artery disease  involving native coronary artery of native heart with angina pectoris ([place]) 10/04/2018  . Hyperlipidemia 10/04/2018  . Pain in right hand 10/02/2018  . Myalgia due to statin 03/29/2018  . Stage 3 severe COPD by GOLD classification ([place]) 10/26/2017  . Tobacco abuse 03/07/2016  . Type 2 diabetes mellitus ([place]) 08/26/2015  . Osteoporosis 09/02/2014  . Carotid stenosis 07/22/2012  . PVD 07/02/2009  . BLADDER CANCER 06/05/2009  . Hyperlipidemia associated with type 2 diabetes mellitus ([place]) 06/05/2009  . PSEUDOGOUT 06/05/2009  . MITRAL VALVE PROLAPSE 06/05/2009   Past Medical History:  Diagnosis Date  . Abdominal bruit   . Bladder cancer ([place])   . CAD (coronary artery disease)    a. nonobstructive by cath 08/2018.  . Carotid bruit   . Chronic combined systolic and diastolic CHF (congestive heart failure) ([place])   . CKD (chronic kidney disease), stage II   . Congestive heart failure (CHF) ([place])   . Diabetes mellitus    Type II  . Dyslipidemia   . Heart murmur   . Mild pulmonary hypertension ([place])   . Mitral valve prolapse    a. not seen on most recent echoes. Mild MR now.  Marland Kitchen NICM (nonischemic cardiomyopathy) ([place])   . Osteoporosis   . Pancreatitis, acute   . Pseudogout   . PVD (peripheral vascular disease) ([place])    a. 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  . Sinus tachycardia   . Tobacco abuse     Family History  Problem Relation Age of Onset  . Heart failure Mother   . Hypertension Sister   . Heart attack Brother   . Heart attack Maternal Uncle   . Heart disease Sister   . COPD Brother   . Congestive Heart Failure Brother   . Congestive Heart Failure Daughter   . Rheum arthritis Son   . Ovarian cancer Sister   . Coronary artery disease Neg Hx        Early    Past Surgical History:  Procedure Laterality Date  . ABDOMINAL AORTOGRAM W/LOWER EXTREMITY N/A 12/31/2019   Procedure: ABDOMINAL AORTOGRAM W/LOWER EXTREMITY;   Surgeon: Wellington Hampshire, MD;  Location: [facility];  Service: Cardiovascular;  Laterality: N/A;  . ABDOMINAL AORTOGRAM W/LOWER EXTREMITY N/A 09/01/2020   Procedure: ABDOMINAL AORTOGRAM W/LOWER EXTREMITY;  Surgeon: Wellington Hampshire, MD;  Location: [facility];  Service: Cardiovascular;  Laterality: N/A;  . APPENDECTOMY    . CORONARY STENT INTERVENTION N/A 10/13/2020   Procedure: CORONARY STENT INTERVENTION;  Surgeon: Leonie Man, MD;  Location: [facility];  Service: Cardiovascular;  Laterality: N/A;  . Hysterectomy-type unspecified    . INTRAVASCULAR PRESSURE WIRE/FFR STUDY N/A 10/13/2020  Procedure: INTRAVASCULAR PRESSURE WIRE/FFR STUDY;  Surgeon: Leonie Man, MD;  Location: [facility];  Service: Cardiovascular;  Laterality: N/A;  . LAPAROSCOPIC CHOLECYSTECTOMY  2009  . PERIPHERAL VASCULAR INTERVENTION  12/31/2019   Procedure: PERIPHERAL VASCULAR INTERVENTION;  Surgeon: Wellington Hampshire, MD;  Location: [facility];  Service: Cardiovascular;;  Bilateral [place]  . RIGHT/LEFT HEART CATH AND CORONARY ANGIOGRAPHY N/A 09/16/2018   Procedure: RIGHT/LEFT HEART CATH AND CORONARY ANGIOGRAPHY;  Surgeon: Sherren Mocha, MD;  Location: [facility];  Service: Cardiovascular;  Laterality: N/A;  . RIGHT/LEFT HEART CATH AND CORONARY ANGIOGRAPHY N/A 10/13/2020   Procedure: RIGHT/LEFT HEART CATH AND CORONARY ANGIOGRAPHY;  Surgeon: Jolaine Artist, MD;  Location: [facility];  Service: Cardiovascular;  Laterality: N/A;   Social History   Occupational History  . Occupation: Retired  Tobacco Use  . Smoking status: Current Every Day Smoker    Packs/day: 1.00    Years: 50.00    Pack years: 50.00    Types: Cigarettes  . Smokeless tobacco: Never Used  Vaping Use  . Vaping Use: Never used  Substance and Sexual Activity  . Alcohol use: No  . Drug use: No  . Sexual activity: Not Currently

## 2021-02-02 ENCOUNTER — Other Ambulatory Visit: Payer: Self-pay | Admitting: Primary Care

## 2021-02-02 DIAGNOSIS — J449 Chronic obstructive pulmonary disease, unspecified: Secondary | ICD-10-CM

## 2021-02-02 MED ORDER — TRELEGY ELLIPTA 100-62.5-25 MCG/INH IN AEPB: 1.0000 | INHALATION_SPRAY | Freq: Every day | RESPIRATORY_TRACT | 5 refills | Status: AC

## 2021-02-02 MED ORDER — ALBUTEROL SULFATE HFA 108 (90 BASE) MCG/ACT IN AERS: 2.0000 | INHALATION_SPRAY | Freq: Four times a day (QID) | RESPIRATORY_TRACT | 5 refills | Status: AC | PRN

## 2021-02-02 MED ORDER — RIVAROXABAN 2.5 MG PO TABS: 2.5000 mg | ORAL_TABLET | Freq: Two times a day (BID) | ORAL | 11 refills | Status: DC

## 2021-02-02 MED ORDER — INSULIN ASPART 100 UNIT/ML IJ SOLN: 5.0000 [IU] | Freq: Three times a day (TID) | INTRAMUSCULAR | 1 refills | Status: DC

## 2021-02-02 MED ORDER — PENTOXIFYLLINE ER 400 MG PO TBCR: 400.0000 mg | EXTENDED_RELEASE_TABLET | Freq: Three times a day (TID) | ORAL | 3 refills | Status: DC

## 2021-02-02 MED ORDER — LEVALBUTEROL HCL 0.63 MG/3ML IN NEBU: 0.6300 mg | INHALATION_SOLUTION | RESPIRATORY_TRACT | 2 refills | Status: DC | PRN

## 2021-02-02 MED ORDER — NITROGLYCERIN 0.3 MG SL SUBL: 0.3000 mg | SUBLINGUAL_TABLET | SUBLINGUAL | 3 refills | Status: AC | PRN

## 2021-02-02 NOTE — Telephone Encounter (Signed)
Hello Beth, Can you please fax over new prescriptions for Inhalers to Micah Noel with [facility]. She is trying to get them covered by the county prescription assistance program. Her phone # is [phone]. She asked if you could fax them to her at [phone]. I did not pick up inhaler prescription at [facility] only the antibiotics. I asked them to cancel that prescription for Inhalers until I can see if the county will approve these through their county prescription assistance program. Please call me if you have any questions. Thank you, Lawerance Sabal   Beth, please advise. Just wanted to make sure it was ok. Thanks!

## 2021-02-02 NOTE — Telephone Encounter (Signed)
Son requesting refills be sent to [facility] and faxed to [phone]  Patient to call and follow up on [phone]

## 2021-02-02 NOTE — Telephone Encounter (Signed)
Yes we can fax prescription for patient assistance

## 2021-02-19 ENCOUNTER — Inpatient Hospital Stay (HOSPITAL_COMMUNITY)
Admission: EM | Admit: 2021-02-19 | Discharge: 2021-02-22 | DRG: 378 | Disposition: A | Discharge status: Home Health | Payer: Medicare Other | Attending: Family Medicine | Admitting: Family Medicine

## 2021-02-19 ENCOUNTER — Emergency Department (HOSPITAL_COMMUNITY): Payer: Medicare Other

## 2021-02-19 ENCOUNTER — Other Ambulatory Visit: Payer: Self-pay

## 2021-02-19 DIAGNOSIS — Z8249 Family history of ischemic heart disease and other diseases of the circulatory system: Secondary | ICD-10-CM

## 2021-02-19 DIAGNOSIS — K922 Gastrointestinal hemorrhage, unspecified: Secondary | ICD-10-CM | POA: Diagnosis not present

## 2021-02-19 DIAGNOSIS — Z888 Allergy status to other drugs, medicaments and biological substances status: Secondary | ICD-10-CM

## 2021-02-19 DIAGNOSIS — Z8551 Personal history of malignant neoplasm of bladder: Secondary | ICD-10-CM

## 2021-02-19 DIAGNOSIS — I272 Pulmonary hypertension, unspecified: Secondary | ICD-10-CM | POA: Diagnosis present

## 2021-02-19 DIAGNOSIS — I471 Supraventricular tachycardia: Secondary | ICD-10-CM | POA: Diagnosis present

## 2021-02-19 DIAGNOSIS — I708 Atherosclerosis of other arteries: Secondary | ICD-10-CM | POA: Diagnosis not present

## 2021-02-19 DIAGNOSIS — I25119 Atherosclerotic heart disease of native coronary artery with unspecified angina pectoris: Secondary | ICD-10-CM | POA: Diagnosis not present

## 2021-02-19 DIAGNOSIS — D62 Acute posthemorrhagic anemia: Secondary | ICD-10-CM | POA: Diagnosis not present

## 2021-02-19 DIAGNOSIS — Z794 Long term (current) use of insulin: Secondary | ICD-10-CM

## 2021-02-19 DIAGNOSIS — Z955 Presence of coronary angioplasty implant and graft: Secondary | ICD-10-CM

## 2021-02-19 DIAGNOSIS — E11649 Type 2 diabetes mellitus with hypoglycemia without coma: Secondary | ICD-10-CM | POA: Diagnosis present

## 2021-02-19 DIAGNOSIS — E1165 Type 2 diabetes mellitus with hyperglycemia: Secondary | ICD-10-CM | POA: Diagnosis present

## 2021-02-19 DIAGNOSIS — N1832 Chronic kidney disease, stage 3b: Secondary | ICD-10-CM | POA: Diagnosis present

## 2021-02-19 DIAGNOSIS — Z79899 Other long term (current) drug therapy: Secondary | ICD-10-CM

## 2021-02-19 DIAGNOSIS — J449 Chronic obstructive pulmonary disease, unspecified: Secondary | ICD-10-CM | POA: Diagnosis present

## 2021-02-19 DIAGNOSIS — D649 Anemia, unspecified: Secondary | ICD-10-CM

## 2021-02-19 DIAGNOSIS — M810 Age-related osteoporosis without current pathological fracture: Secondary | ICD-10-CM | POA: Diagnosis present

## 2021-02-19 DIAGNOSIS — Z7952 Long term (current) use of systemic steroids: Secondary | ICD-10-CM

## 2021-02-19 DIAGNOSIS — E162 Hypoglycemia, unspecified: Secondary | ICD-10-CM

## 2021-02-19 DIAGNOSIS — E785 Hyperlipidemia, unspecified: Secondary | ICD-10-CM | POA: Diagnosis not present

## 2021-02-19 DIAGNOSIS — Z20822 Contact with and (suspected) exposure to covid-19: Secondary | ICD-10-CM | POA: Diagnosis present

## 2021-02-19 DIAGNOSIS — Z91041 Radiographic dye allergy status: Secondary | ICD-10-CM

## 2021-02-19 DIAGNOSIS — I447 Left bundle-branch block, unspecified: Secondary | ICD-10-CM | POA: Diagnosis not present

## 2021-02-19 DIAGNOSIS — E119 Type 2 diabetes mellitus without complications: Secondary | ICD-10-CM

## 2021-02-19 DIAGNOSIS — I700 Atherosclerosis of aorta: Secondary | ICD-10-CM | POA: Diagnosis not present

## 2021-02-19 DIAGNOSIS — E1122 Type 2 diabetes mellitus with diabetic chronic kidney disease: Secondary | ICD-10-CM | POA: Diagnosis present

## 2021-02-19 DIAGNOSIS — Z720 Tobacco use: Secondary | ICD-10-CM | POA: Diagnosis present

## 2021-02-19 DIAGNOSIS — R000 Tachycardia, unspecified: Secondary | ICD-10-CM | POA: Diagnosis not present

## 2021-02-19 DIAGNOSIS — Z882 Allergy status to sulfonamides status: Secondary | ICD-10-CM

## 2021-02-19 DIAGNOSIS — Z9981 Dependence on supplemental oxygen: Secondary | ICD-10-CM

## 2021-02-19 DIAGNOSIS — I5042 Chronic combined systolic (congestive) and diastolic (congestive) heart failure: Secondary | ICD-10-CM | POA: Diagnosis not present

## 2021-02-19 DIAGNOSIS — E1151 Type 2 diabetes mellitus with diabetic peripheral angiopathy without gangrene: Secondary | ICD-10-CM | POA: Diagnosis present

## 2021-02-19 DIAGNOSIS — Z825 Family history of asthma and other chronic lower respiratory diseases: Secondary | ICD-10-CM

## 2021-02-19 DIAGNOSIS — Z743 Need for continuous supervision: Secondary | ICD-10-CM | POA: Diagnosis not present

## 2021-02-19 DIAGNOSIS — R61 Generalized hyperhidrosis: Secondary | ICD-10-CM | POA: Diagnosis not present

## 2021-02-19 DIAGNOSIS — R55 Syncope and collapse: Secondary | ICD-10-CM | POA: Diagnosis not present

## 2021-02-19 DIAGNOSIS — R6889 Other general symptoms and signs: Secondary | ICD-10-CM | POA: Diagnosis not present

## 2021-02-19 DIAGNOSIS — Z7901 Long term (current) use of anticoagulants: Secondary | ICD-10-CM

## 2021-02-19 DIAGNOSIS — R110 Nausea: Secondary | ICD-10-CM | POA: Diagnosis not present

## 2021-02-19 DIAGNOSIS — Z7902 Long term (current) use of antithrombotics/antiplatelets: Secondary | ICD-10-CM

## 2021-02-19 LAB — BLOOD GAS, ARTERIAL
Acid-Base Excess: 7.8 mmol/L — ABNORMAL HIGH (ref 0.0–2.0)
Bicarbonate: 31.4 mmol/L — ABNORMAL HIGH (ref 20.0–28.0)
FIO2: 21
O2 Saturation: 96.7 %
Patient temperature: 37
pCO2 arterial: 44.4 mmHg (ref 32.0–48.0)
pH, Arterial: 7.469 — ABNORMAL HIGH (ref 7.350–7.450)
pO2, Arterial: 80.6 mmHg — ABNORMAL LOW (ref 83.0–108.0)

## 2021-02-19 LAB — CBC WITH DIFFERENTIAL/PLATELET
Abs Immature Granulocytes: 0.07 10*3/uL (ref 0.00–0.07)
Basophils Absolute: 0 10*3/uL (ref 0.0–0.1)
Basophils Relative: 0 %
Eosinophils Absolute: 0 10*3/uL (ref 0.0–0.5)
Eosinophils Relative: 0 %
HCT: 28.9 % — ABNORMAL LOW (ref 36.0–46.0)
Hemoglobin: 9.2 g/dL — ABNORMAL LOW (ref 12.0–15.0)
Immature Granulocytes: 1 %
Lymphocytes Relative: 13 %
Lymphs Abs: 1.2 10*3/uL (ref 0.7–4.0)
MCH: 34.2 pg — ABNORMAL HIGH (ref 26.0–34.0)
MCHC: 31.8 g/dL (ref 30.0–36.0)
MCV: 107.4 fL — ABNORMAL HIGH (ref 80.0–100.0)
Monocytes Absolute: 0.5 10*3/uL (ref 0.1–1.0)
Monocytes Relative: 5 %
Neutro Abs: 7.2 10*3/uL (ref 1.7–7.7)
Neutrophils Relative %: 81 %
Platelets: 222 10*3/uL (ref 150–400)
RBC: 2.69 MIL/uL — ABNORMAL LOW (ref 3.87–5.11)
RDW: 20.3 % — ABNORMAL HIGH (ref 11.5–15.5)
WBC: 9 10*3/uL (ref 4.0–10.5)
nRBC: 0.2 % (ref 0.0–0.2)

## 2021-02-19 LAB — COMPREHENSIVE METABOLIC PANEL
ALT: 30 U/L (ref 0–44)
AST: 20 U/L (ref 15–41)
Albumin: 3.4 g/dL — ABNORMAL LOW (ref 3.5–5.0)
Alkaline Phosphatase: 49 U/L (ref 38–126)
Anion gap: 5 (ref 5–15)
BUN: 51 mg/dL — ABNORMAL HIGH (ref 8–23)
CO2: 34 mmol/L — ABNORMAL HIGH (ref 22–32)
Calcium: 8.5 mg/dL — ABNORMAL LOW (ref 8.9–10.3)
Chloride: 97 mmol/L — ABNORMAL LOW (ref 98–111)
Creatinine, Ser: 1.36 mg/dL — ABNORMAL HIGH (ref 0.44–1.00)
GFR, Estimated: 39 mL/min — ABNORMAL LOW (ref >60)
Glucose, Bld: 55 mg/dL — ABNORMAL LOW (ref 70–99)
Potassium: 4 mmol/L (ref 3.5–5.1)
Sodium: 136 mmol/L (ref 135–145)
Total Bilirubin: 1.1 mg/dL (ref 0.3–1.2)
Total Protein: 5.7 g/dL — ABNORMAL LOW (ref 6.5–8.1)

## 2021-02-19 LAB — TROPONIN I (HIGH SENSITIVITY)
Troponin I (High Sensitivity): 108 ng/L (ref <18)
Troponin I (High Sensitivity): 112 ng/L (ref <18)

## 2021-02-19 LAB — RESP PANEL BY RT-PCR (FLU A&B, COVID) ARPGX2
Influenza A by PCR: NEGATIVE
Influenza B by PCR: NEGATIVE
SARS Coronavirus 2 by RT PCR: NEGATIVE

## 2021-02-19 LAB — ABO/RH: ABO/RH(D): A POS

## 2021-02-19 LAB — TYPE AND SCREEN
ABO/RH(D): A POS
Antibody Screen: NEGATIVE

## 2021-02-19 LAB — BRAIN NATRIURETIC PEPTIDE: B Natriuretic Peptide: 179 pg/mL — ABNORMAL HIGH (ref 0.0–100.0)

## 2021-02-19 LAB — CBG MONITORING, ED
Glucose-Capillary: 47 mg/dL — ABNORMAL LOW (ref 70–99)
Glucose-Capillary: 49 mg/dL — ABNORMAL LOW (ref 70–99)
Glucose-Capillary: 227 mg/dL — ABNORMAL HIGH (ref 70–99)
Glucose-Capillary: 177 mg/dL — ABNORMAL HIGH (ref 70–99)

## 2021-02-19 LAB — POC OCCULT BLOOD, ED: Fecal Occult Bld: POSITIVE — AB

## 2021-02-19 LAB — OCCULT BLOOD X 1 CARD TO LAB, STOOL: Fecal Occult Bld: POSITIVE — AB

## 2021-02-19 MED ORDER — PANTOPRAZOLE SODIUM 40 MG IV SOLR
40.0000 mg | Freq: Once | INTRAVENOUS | Status: AC
  Administered 2021-02-19: 40 mg via INTRAVENOUS
  Filled 2021-02-19: qty 40

## 2021-02-19 MED ORDER — ONDANSETRON HCL 4 MG/2ML IJ SOLN: 4.0000 mg | Freq: Four times a day (QID) | INTRAMUSCULAR | Status: DC | PRN

## 2021-02-19 MED ORDER — SODIUM CHLORIDE 0.9 % IV SOLN: 250.0000 mL | INTRAVENOUS | Status: DC | PRN

## 2021-02-19 MED ORDER — INSULIN ASPART 100 UNIT/ML IJ SOLN
0.0000 [IU] | Freq: Three times a day (TID) | INTRAMUSCULAR | Status: DC
  Administered 2021-02-20 – 2021-02-21 (×2): 2 [IU] via SUBCUTANEOUS

## 2021-02-19 MED ORDER — PANTOPRAZOLE SODIUM 40 MG IV SOLR
40.0000 mg | Freq: Two times a day (BID) | INTRAVENOUS | Status: DC
Start: 2021-02-23 — End: 2021-02-22

## 2021-02-19 MED ORDER — PANTOPRAZOLE SODIUM 40 MG IV SOLR
8.0000 mg/h | INTRAVENOUS | Status: DC
Start: 2021-02-19 — End: 2021-02-22
  Administered 2021-02-19 – 2021-02-22 (×6): 8 mg/h via INTRAVENOUS
  Filled 2021-02-19 (×8): qty 80

## 2021-02-19 MED ORDER — SODIUM CHLORIDE 0.9% FLUSH
3.0000 mL | Freq: Two times a day (BID) | INTRAVENOUS | Status: DC
  Administered 2021-02-20 – 2021-02-21 (×3): 3 mL via INTRAVENOUS

## 2021-02-19 MED ORDER — TRAZODONE HCL 50 MG PO TABS
50.0000 mg | ORAL_TABLET | Freq: Every evening | ORAL | Status: DC | PRN
  Administered 2021-02-21: 50 mg via ORAL

## 2021-02-19 MED ORDER — NITROGLYCERIN 0.2 MG/HR TD PT24
0.2000 mg | MEDICATED_PATCH | Freq: Every day | TRANSDERMAL | Status: DC
  Administered 2021-02-20 – 2021-02-22 (×3): 0.2 mg via TRANSDERMAL
  Filled 2021-02-19 (×4): qty 1

## 2021-02-19 MED ORDER — VITAMIN D 25 MCG (1000 UNIT) PO TABS
4000.0000 [IU] | ORAL_TABLET | Freq: Every day | ORAL | Status: DC
  Administered 2021-02-20 – 2021-02-22 (×3): 4000 [IU] via ORAL
  Filled 2021-02-19 (×5): qty 4

## 2021-02-19 MED ORDER — POLYETHYLENE GLYCOL 3350 17 G PO PACK: 17.0000 g | PACK | Freq: Every day | ORAL | Status: DC | PRN

## 2021-02-19 MED ORDER — ACETAMINOPHEN 325 MG PO TABS: 650.0000 mg | ORAL_TABLET | Freq: Four times a day (QID) | ORAL | Status: DC | PRN

## 2021-02-19 MED ORDER — SODIUM CHLORIDE 0.9% FLUSH: 3.0000 mL | INTRAVENOUS | Status: DC | PRN

## 2021-02-19 MED ORDER — ALBUTEROL SULFATE (2.5 MG/3ML) 0.083% IN NEBU: 2.5000 mg | INHALATION_SOLUTION | RESPIRATORY_TRACT | Status: DC | PRN

## 2021-02-19 MED ORDER — DEXTROSE 50 % IV SOLN
1.0000 | Freq: Once | INTRAVENOUS | Status: AC
  Administered 2021-02-19: 50 mL via INTRAVENOUS
  Filled 2021-02-19: qty 50

## 2021-02-19 MED ORDER — ACETAMINOPHEN 650 MG RE SUPP: 650.0000 mg | Freq: Four times a day (QID) | RECTAL | Status: DC | PRN

## 2021-02-19 MED ORDER — BISACODYL 10 MG RE SUPP
10.0000 mg | Freq: Every day | RECTAL | Status: DC | PRN
Start: 2021-02-19 — End: 2021-02-22

## 2021-02-19 MED ORDER — ONDANSETRON HCL 4 MG PO TABS: 4.0000 mg | ORAL_TABLET | Freq: Four times a day (QID) | ORAL | Status: DC | PRN

## 2021-02-19 MED ORDER — FLUTICASONE-UMECLIDIN-VILANT 100-62.5-25 MCG/INH IN AEPB: 1.0000 | INHALATION_SPRAY | Freq: Every day | RESPIRATORY_TRACT | Status: DC

## 2021-02-19 MED ORDER — SODIUM CHLORIDE 0.9% FLUSH
3.0000 mL | Freq: Two times a day (BID) | INTRAVENOUS | Status: DC
  Administered 2021-02-20 – 2021-02-21 (×2): 3 mL via INTRAVENOUS

## 2021-02-19 MED ORDER — UMECLIDINIUM-VILANTEROL 62.5-25 MCG/INH IN AEPB
1.0000 | INHALATION_SPRAY | Freq: Every day | RESPIRATORY_TRACT | Status: DC
  Administered 2021-02-20 – 2021-02-22 (×3): 1 via RESPIRATORY_TRACT
  Filled 2021-02-19: qty 14

## 2021-02-19 MED ORDER — NITROGLYCERIN 0.3 MG SL SUBL
0.3000 mg | SUBLINGUAL_TABLET | SUBLINGUAL | Status: DC | PRN
  Filled 2021-02-19: qty 100

## 2021-02-19 MED ORDER — NAPHAZOLINE-PHENIRAMINE 0.025-0.3 % OP SOLN
1.0000 [drp] | Freq: Every day | OPHTHALMIC | Status: DC | PRN
  Filled 2021-02-19: qty 5

## 2021-02-19 MED ORDER — METOPROLOL SUCCINATE ER 25 MG PO TB24
25.0000 mg | ORAL_TABLET | Freq: Every day | ORAL | Status: DC
  Administered 2021-02-20 – 2021-02-22 (×2): 25 mg via ORAL
  Filled 2021-02-19 (×3): qty 1

## 2021-02-19 MED ORDER — ALBUTEROL SULFATE HFA 108 (90 BASE) MCG/ACT IN AERS
2.0000 | INHALATION_SPRAY | Freq: Four times a day (QID) | RESPIRATORY_TRACT | Status: DC | PRN
  Administered 2021-02-20: 2 via RESPIRATORY_TRACT
  Filled 2021-02-19: qty 6.7

## 2021-02-19 MED ORDER — SODIUM CHLORIDE 0.9 % IV SOLN
80.0000 mg | Freq: Once | INTRAVENOUS | Status: AC
  Administered 2021-02-19: 80 mg via INTRAVENOUS
  Filled 2021-02-19: qty 80

## 2021-02-19 NOTE — ED Triage Notes (Signed)
Per rcems called out for near syncopal events. Per daughter patient was sweaty and diaphoretic. Concern for pt passing out. Per report took nitro around 4pm, but no complaints of chest pain. Patient has history of anxiety  Per rcems patient started screaming episode pt went rigid per report. Patient complained of nausea zofran given in route.

## 2021-02-19 NOTE — ED Provider Notes (Signed)
This patient is an 80 year old female, she has a known history of coronary disease as well as COPD.  She had several months ago undergone heart catheterization during which time she had a drug-eluting stent placed in her left anterior descending artery, she is on both Plavix as well as Xarelto.  She is not having any symptoms at this time however she has had a couple of episodes over the last 12 hours where she became extremely diaphoretic and either passed out or almost passed out and became extremely weak following over at the table.  This is unusual behavior for her, she is back to her normal self at this time, this were not caught on cardiac monitoring it is unclear whether she had an arrhythmia however during her last echocardiogram which was performed in January 2022 she had an ejection fraction of 40 to 45%.  On my exam the patient has clear heart sounds, clear lung sounds, no wheezing, she has slight right lower extremity edema which she states has been chronically swollen for which she wears a compression stocking.  Both of the feet look good, there are pulses are palpable but weak, capillary refill is normal.  Labs, eKG, saline lock, CXR< Covid, anticipate admit  D/w Dr. Denton Brick who will admit D/w Dr. Gala Romney who will see in AM - does not need to see tonight since pt is HD stable - recommends protonix and hold xarelto / plavix.   EKG Interpretation  Date/Time:  Saturday Feb 19 2021 22:24:04 EDT Ventricular Rate:  79 PR Interval:  148 QRS Duration: 135 QT Interval:  421 QTC Calculation: 483 R Axis:   -64 Text Interpretation: Sinus rhythm Left bundle branch block Confirmed by Quintella Reichert [phone]) on 02/20/2021 4:26:29 PM        Medical screening examination/treatment/procedure(s) were conducted as a shared visit with non-physician practitioner(s) and myself.  I personally evaluated the patient during the encounter.  Clinical Impression:   Final diagnoses:  Hypoglycemia   Gastrointestinal hemorrhage, unspecified gastrointestinal hemorrhage type  Anemia, unspecified type         Noemi Chapel, MD 02/25/21 1724

## 2021-02-19 NOTE — ED Notes (Signed)
Date and time results received: 02/19/21 @ 2056(use smartphrase ".now" to insert current time)  Test: Troponin Critical Value: 108 Name of Provider Notified: Evaristo Bury Orders Received? None yet Or Actions Taken?:

## 2021-02-19 NOTE — Progress Notes (Signed)
Called by EDP for possible admission --EDP states patient had at least 2 presyncopal episodes in the last 12 hours and patient is on Xarelto and Plavix--- with a drop in H&H and heme positive stool--EDP is concerned about GI bleed  -I requested that EDP call GI service prior to patient being admitted to the hospital -case Discussed with Dr Sabra Heck who agrees to talk to GI prior to hospitalist admitting patient  Roxan Hockey, MD

## 2021-02-19 NOTE — H&P (Signed)
Patient Demographics:    Hannah French, is a 80 y.o. female  MRN: [id number]   DOB - 03/16/1941  Admit Date - 02/19/2021  Outpatient Primary MD for the patient is Dettinger, Fransisca Kaufmann, MD   Assessment & Plan:    Principal Problem:   Acute GI bleeding Active Problems:   Syncope   Acute on chronic blood loss anemia   Type 2 diabetes mellitus (HCC)   Tobacco abuse   Stage 3 severe COPD by GOLD classification ([place])   Coronary artery disease involving native coronary artery of native heart with angina pectoris (HCC)   Chronic combined systolic and diastolic CHF (congestive heart failure) ([place])   A/p 1)Recurrent Presyncopal episodes----with concerns about possible GI bleed -Per patient and daughter at least 2 episodes in the last 12 hours -apparently when EMS got to patient's sugar was 129 -Patient had episode of hypoglycemia later in the ED are related to her episodes of presyncope -Patient had a Zio patch placed a month ago--Zio patch analysis by Dr. Noni Saupe from 02/07/2021 revealed No significant arrhythmias -No chest pains,, no shortness of breath, no leg pains or pleuritic symptoms -EDP reports heme positive stools on her exam (official FOBT pending) -Hemoglobin is down to 9.2 from 11.4 on 01/27/2021 patient baseline hemoglobin usually between 10 and 11 -EDP discussed with on-call GI physician Dr. 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 Haroldine Laws  cardiologist and also sees Dr. Sharol Given for PAD -c/n  nitroglycerin patch,  --Intolerance to statins, patient is on Repatha Troponin 108 >> 112---EKG sinus rhythm with LBBB, No change from prior -Patient remains chest pain-free ,no ACS type symptoms -Had episodes of recurrent syncope as noted above #1 -Hold Plavix, Trental and Xarelto pending GI evaluation due to #1 above -Okay to continue metoprolol with hold parameters  3)Combined Systolic and Diastolic Dysfunction CHF,  --EF from echo of 10/21/2020 is 40- 45%, there was grade 1 diastolic dysfunction and hypokinesis of the left ventricle -Clinically no evidence of overload BNP 179, chest x-ray without acute cardiopulmonary findings -Hold Lasix given concerns about possible GI bleed --Daily weight and fluid input and output monitoring  4)DM2- A1c 9.8 reflecting uncontrolled DM WITH HYPERGLYCEMIA PTA -Patient had episode of hypoglycemia here in the ED after being here for a while with sugar of 55 requiring dextrose infusion (initially when EMS got to patient at her house blood sugar was 129) -Hold Tresiba, stop Amaryl Use Novolog/Humalog Sliding scale insulin with Accu-Cheks/Fingersticks as ordered  5)CKD 3B--appears stable/at baseline at this time - - renally adjust medications, avoid nephrotoxic agents / dehydration  / hypotension  6) COPD -no acute exacerbation at this time continue bronchodilators,  hold off on steroids especially given GI bleed  7) social/ethics--- patient is a full code without limitations of treatment -Additional history obtained from patient's daughter at bedside  Disposition/Need for in-Hospital Stay- patient unable to be discharged at this time due to -recurrent presyncopal episodes with concern for GI bleed requiring possible endoluminal evaluation prior to making a decision on whether Xarelto, Plavix and Trental can be restarted  Dispo: The patient is from: Home              Anticipated d/c is to: Home               Anticipated d/c date is: 2 days              Patient currently is not medically stable to d/c. Barriers: Not Clinically Stable-   With History of - Reviewed by me  Past Medical History:  Diagnosis Date  . Abdominal bruit   . Bladder cancer ([place])   . CAD (coronary artery disease)    a. nonobstructive by cath 08/2018.  . Carotid bruit   . Chronic combined systolic and diastolic CHF (congestive heart failure) ([place])   . CKD (chronic kidney disease), stage II   . Congestive heart failure (CHF) ([place])   . Diabetes mellitus    Type II  . Dyslipidemia   . Heart murmur   . Mild pulmonary hypertension ([place])   . Mitral valve prolapse    a. not seen on most recent echoes. Mild MR now.  Marland Kitchen NICM (nonischemic cardiomyopathy) ([place])   . Osteoporosis   . Pancreatitis, acute   . Pseudogout   . PVD (peripheral vascular disease) ([place])    a. 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  . Sinus tachycardia   . Tobacco abuse       Past Surgical History:  Procedure Laterality Date  . ABDOMINAL AORTOGRAM W/LOWER EXTREMITY N/A 12/31/2019   Procedure: ABDOMINAL AORTOGRAM W/LOWER EXTREMITY;  Surgeon: Wellington Hampshire, MD;  Location: [facility];  Service: Cardiovascular;  Laterality: N/A;  . ABDOMINAL AORTOGRAM W/LOWER EXTREMITY N/A 09/01/2020   Procedure: ABDOMINAL AORTOGRAM W/LOWER EXTREMITY;  Surgeon: Wellington Hampshire, MD;  Location: [facility];  Service: Cardiovascular;  Laterality: N/A;  . APPENDECTOMY    . CORONARY STENT INTERVENTION N/A 10/13/2020   Procedure: CORONARY STENT INTERVENTION;  Surgeon: Leonie Man, MD;  Location: [facility];  Service: Cardiovascular;  Laterality: N/A;  . Hysterectomy-type unspecified    . INTRAVASCULAR PRESSURE WIRE/FFR STUDY N/A 10/13/2020   Procedure: INTRAVASCULAR PRESSURE WIRE/FFR STUDY;  Surgeon: Leonie Man, MD;  Location: [facility];  Service: Cardiovascular;   Laterality: N/A;  . LAPAROSCOPIC CHOLECYSTECTOMY  2009  . PERIPHERAL VASCULAR INTERVENTION  12/31/2019   Procedure: PERIPHERAL VASCULAR INTERVENTION;  Surgeon: Wellington Hampshire, MD;  Location: [facility];  Service: Cardiovascular;;  Bilateral Iliacs  . RIGHT/LEFT HEART CATH AND CORONARY ANGIOGRAPHY N/A 09/16/2018   Procedure: RIGHT/LEFT HEART CATH AND CORONARY ANGIOGRAPHY;  Surgeon: Sherren Mocha, MD;  Location: [facility];  Service: Cardiovascular;  Laterality: N/A;  . RIGHT/LEFT HEART CATH AND CORONARY ANGIOGRAPHY N/A 10/13/2020   Procedure: RIGHT/LEFT HEART CATH AND CORONARY ANGIOGRAPHY;  Surgeon: Jolaine Artist, MD;  Location: [facility];  Service: Cardiovascular;  Laterality: N/A;    Chief Complaint  Patient presents with  . multiple      HPI:    Hannah French  is a  80 y.o. female  With pmhx relevant for T2DM,hyperlipidemia, coronary artery disease, status post prior angioplasty and stent placement, combined systolic and diastolic heart failure, peripheral vascular diseaseandCOPD on rivaroxaban2.5 mgfor CAD/PADprevention presents recurrent episodes of frank syncope--- at least 2 episodes over the last 12 hours --when EMS got there her blood sugar was 129 but patient almost lost consciousness was very diaphoretic , very dizzy very nauseous --no emesis she denies chest pains shortness of breath pleuritic symptoms or leg pains  - -Per patient and daughter at least 2 episodes in the last 12 hours -apparently when EMS got to patient's sugar was 129 -Patient had episode of hypoglycemia later in the ED are related to her episodes of presyncope -Patient had a Zio patch placed a month ago--Zio patch analysis by Dr. Edwina Barth from 02/07/2021 reveals no significant arrhythmias -No chest pains,, no shortness of breath, no leg pains or pleuritic symptoms -EDP reports heme positive stools on her exam (official FOBT pending) patient baseline hemoglobin  usually between 10 and 1-Hemoglobin is down to 9.2 from 11.4 on 01/27/2021 1 -EDP discussed with on-call GI physician Dr. Gala Romney -IV Protonix ordered -We will stop Xarelto, stop Plavix and start Trental -Serial H&H and transfuse as clinically indicated -- Troponin 108 >> 112---EKG sinus rhythm with LBBB, No change from prior -Patient remains chest pain-free ,no ACS type symptoms -chest X-ray without acute cardiopulmonary findings --Patient had episode of hypoglycemia here in the ED after being here for a while with sugar of 55 requiring dextrose infusion (initially when EMS got to patient at her house blood sugar was 129) -BNP 159, creatinine 1.26 which is close to patient's baseline   Review of systems:    In addition to the HPI above,   A full Review of  Systems was done, all other systems reviewed are negative except as noted above in HPI , .    Social History:  Reviewed by me    Social History   Tobacco Use  . Smoking status: Current Every Day Smoker    Packs/day: 1.00    Years: 50.00    Pack years: 50.00    Types: Cigarettes  . Smokeless tobacco: Never Used  Substance Use Topics  . Alcohol use: No     Family History :  Reviewed by me    Family History  Problem Relation Age of Onset  . Heart failure Mother   . Hypertension Sister   . Heart attack Brother   . Heart attack Maternal Uncle   . Heart disease Sister   . COPD Brother   . Congestive Heart Failure Brother   . Congestive Heart Failure Daughter   . Rheum arthritis Son   . Ovarian cancer Sister   . Coronary artery disease Neg Hx        Early    Home Medications:   Prior to Admission medications   Medication Sig Start Date End Date Taking? Authorizing Provider  acetaminophen (TYLENOL) 500 MG tablet Take 500 mg by mouth every 6 (six) hours as needed for moderate pain.   Yes [provider]  albuterol (VENTOLIN HFA) 108 (90 Base) MCG/ACT inhaler Inhale 2 puffs into the lungs every 6 (six) hours  as needed for wheezing or shortness of breath. 02/02/21  Yes Martyn Ehrich, NP  Cholecalciferol (VITAMIN D) 50 MCG (2000 UT) tablet Take 4,000 Units by mouth daily.    Yes [provider]  clopidogrel (PLAVIX) 75 MG tablet Take 1 tablet (75 mg total)  by mouth daily. 10/13/20  Yes Cheryln Manly, NP  cyclobenzaprine (FLEXERIL) 10 MG tablet TAKE 1 TABLET THREE TIMES DAILY FOR MUSCLE SPASM Patient taking differently: Take 10 mg by mouth 3 (three) times daily as needed for muscle spasms. 01/26/21  Yes Dettinger, Fransisca Kaufmann, MD  Fluticasone-Umeclidin-Vilant (TRELEGY ELLIPTA) 100-62.5-25 MCG/INH AEPB Inhale 1 puff into the lungs daily. 02/02/21  Yes Martyn Ehrich, NP  furosemide (LASIX) 20 MG tablet Take 2 tablets daily. You may take an extra dose for increase weight gain orswelling 09/09/20  Yes Dettinger, Fransisca Kaufmann, MD  glimepiride (AMARYL) 2 MG tablet Take 1-2 tablets (2-4 mg total) by mouth daily with breakfast. 09/09/20  Yes Dettinger, Fransisca Kaufmann, MD  insulin aspart (NOVOLOG) 100 UNIT/ML injection Inject 5-20 Units into the skin 3 (three) times daily before meals. 02/02/21  Yes Dettinger, Fransisca Kaufmann, MD  insulin degludec (TRESIBA FLEXTOUCH) 100 UNIT/ML FlexTouch Pen Inject 10 Units into the skin daily. 09/09/20  Yes Dettinger, Fransisca Kaufmann, MD  levalbuterol Penne Lash) 0.63 MG/3ML nebulizer solution Take 3 mLs (0.63 mg total) by nebulization every 4 (four) hours as needed for wheezing or shortness of breath. 02/02/21 02/02/22 Yes Martyn Ehrich, NP  metoprolol succinate (TOPROL-XL) 25 MG 24 hr tablet Take 1 tablet (25 mg total) by mouth daily. 03/15/20  Yes Bensimhon, Shaune Pascal, MD  nitroGLYCERIN (NITRODUR - DOSED IN MG/24 HR) 0.2 mg/hr patch Place 1 patch (0.2 mg total) onto the skin daily. 11/02/20  Yes Newt Minion, MD  nitroGLYCERIN (NITROSTAT) 0.3 MG SL tablet Place 1 tablet (0.3 mg total) under the tongue every 5 (five) minutes as needed for chest pain. 02/02/21 02/02/22 Yes Dettinger, Fransisca Kaufmann,  MD  pentoxifylline (TRENTAL) 400 MG CR tablet Take 1 tablet (400 mg total) by mouth 3 (three) times daily with meals. 02/02/21  Yes Dettinger, Fransisca Kaufmann, MD  predniSONE (DELTASONE) 20 MG tablet Take 3 tabs daily for 1 week, then 2 tabs daily for week 2, then 1 tab daily for week 3. 01/24/21  Yes Dettinger, Fransisca Kaufmann, MD  rivaroxaban (XARELTO) 2.5 MG TABS tablet Take 1 tablet (2.5 mg total) by mouth 2 (two) times daily. 02/02/21  Yes Dettinger, Fransisca Kaufmann, MD  traMADol (ULTRAM) 50 MG tablet TAKE 1 TABLET EVERY 6 HOURS AS NEEDED FOR MODERATE PAIN Patient taking differently: Take 50 mg by mouth every 12 (twelve) hours as needed. 01/26/21  Yes Suzan Slick, NP  doxycycline (VIBRA-TABS) 100 MG tablet Take 1 tablet (100 mg total) by mouth 2 (two) times daily. Patient not taking: Reported on 02/19/2021 02/01/21   Martyn Ehrich, NP  insulin lispro (HUMALOG) 100 UNIT/ML injection SMARTSIG:5-20 Unit(s) SUB-Q 3 Times Daily Patient not taking: Reported on 02/19/2021 01/26/21   [provider]  Naphazoline-Pheniramine (OPCON-A) 0.027-0.315 % SOLN Place 1 drop into both eyes daily as needed (Burning and itching eyes). Patient not taking: Reported on 02/19/2021    [provider]  umeclidinium-vilanterol (ANORO ELLIPTA) 62.5-25 MCG/INH AEPB Inhale 1 puff into the lungs daily. Patient not taking: Reported on 02/19/2021 02/01/21   Martyn Ehrich, NP     Allergies:     Allergies  Allergen Reactions  . Contrast Media [Iodinated Diagnostic Agents] Shortness Of Breath  . Iohexol Other (See Comments)    PASSED OUT DURING THE TEST    . Metformin Swelling    Face and hands became swollen  . Statins Swelling and Other (See Comments)    They make the patient hurt "all over"  Myalgia, also  . Bextra [Valdecoxib] Nausea And Vomiting and Swelling    Shut down my kidneys   . Cefdinir Other (See Comments)    Pancreatitis   . Cozaar [Losartan Potassium] Other (See Comments)    dizziness  . Gabapentin    . Nsaids Other (See Comments)    GI intolerance and pain all over, tolerates ibu   . Zetia [Ezetimibe] Swelling  . Rofecoxib Other (See Comments)    "VIOXX"- "SHUT DOWN MY KIDNEYS"  . Sulfa Antibiotics Nausea And Vomiting and Other (See Comments)    Pancreatitis     Physical Exam:   Vitals  Blood pressure (!) 119/51, pulse 83, temperature 97.6 F (36.4 C), temperature source Oral, resp. rate 19, height 4\' 11"  (1.499 m), weight 50.8 kg, SpO2 94 %.   Temp:  [97.6 F (36.4 C)] 97.6 F (36.4 C) (05/28 1851) Pulse Rate:  [81-83] 83 (05/28 2000) Resp:  [19-22] 19 (05/28 2000) BP: (117-119)/(43-51) 119/51 (05/28 2000) SpO2:  [94 %-95 %] 94 % (05/28 2000) Weight:  [50.8 kg] 50.8 kg (05/28 1852)   Physical Examination: General appearance - alert, chronically ill appearing, and in no distress  Mental status - alert, oriented to person, place, and time,  Eyes - sclera anicteric Nose- Lucas 2L/min Neck - supple, no JVD elevation , Chest -diminished breath sounds with few scattered wheezes, no rhonchi,  Heart - S1 and S2 normal, regular  Abdomen - soft, nontender, nondistended, no masses or organomegaly Neurological - screening mental status exam normal, neck supple without rigidity, cranial nerves II through XII intact, DTR's normal and symmetric Extremities - no pedal edema noted, intact peripheral pulses, left foot with purple discoloration in a patient with history of PAD Skin - warm, dry     Data Review:    CBC Recent Labs  Lab 02/19/21 1941  WBC 9.0  HGB 9.2*  HCT 28.9*  PLT 222  MCV 107.4*  MCH 34.2*  MCHC 31.8  RDW 20.3*  LYMPHSABS 1.2  MONOABS 0.5  EOSABS 0.0  BASOSABS 0.0   ------------------------------------------------------------------------------------------------------------------  Chemistries  Recent Labs  Lab 02/19/21 1941  NA 136  K 4.0  CL 97*  CO2 34*  GLUCOSE 55*  BUN 51*  CREATININE 1.36*  CALCIUM 8.5*  AST 20  ALT 30  ALKPHOS 49   BILITOT 1.1   ------------------------------------------------------------------------------------------------------------------ estimated creatinine clearance is 22.5 mL/min (A) (by C-G formula based on SCr of 1.36 mg/dL (H)). ------------------------------------------------------------------------------------------------------------------ No results for input(s): TSH, T4TOTAL, T3FREE, THYROIDAB in the last 72 hours.  Invalid input(s): FREET3   Coagulation profile No results for input(s): INR, PROTIME in the last 168 hours. ------------------------------------------------------------------------------------------------------------------- No results for input(s): DDIMER in the last 72 hours. -------------------------------------------------------------------------------------------------------------------  Cardiac Enzymes No results for input(s): CKMB, TROPONINI, MYOGLOBIN in the last 168 hours.  Invalid input(s): CK ------------------------------------------------------------------------------------------------------------------    Component Value Date/Time   BNP 179.0 (H) 02/19/2021 1943   Urinalysis    Component Value Date/Time   COLORURINE YELLOW 10/09/2020 1740   APPEARANCEUR CLEAR 10/09/2020 1740   APPEARANCEUR Clear 05/16/2018 1629   LABSPEC 1.014 10/09/2020 1740   PHURINE 5.0 10/09/2020 1740   GLUCOSEU >=500 (A) 10/09/2020 1740   HGBUR NEGATIVE 10/09/2020 1740   BILIRUBINUR NEGATIVE 10/09/2020 1740   BILIRUBINUR Negative 05/16/2018 [place] 10/09/2020 1740   PROTEINUR NEGATIVE 10/09/2020 1740   UROBILINOGEN negative 06/09/2014 1043   NITRITE NEGATIVE 10/09/2020 1740   LEUKOCYTESUR NEGATIVE 10/09/2020 1740     Imaging Results:  DG Chest Portable 1 View  Result Date: 02/19/2021 CLINICAL DATA:  Near syncopal event EXAM: PORTABLE CHEST 1 VIEW COMPARISON:  01/19/2021 FINDINGS: Cardiac shadow is stable. Aortic calcifications are again seen. The  lungs are well aerated bilaterally. No acute bony abnormality is noted. IMPRESSION: No acute abnormality seen. Electronically Signed   By: Inez Catalina M.D.   On: 02/19/2021 19:18    Radiological Exams on Admission: DG Chest Portable 1 View  Result Date: 02/19/2021 CLINICAL DATA:  Near syncopal event EXAM: PORTABLE CHEST 1 VIEW COMPARISON:  01/19/2021 FINDINGS: Cardiac shadow is stable. Aortic calcifications are again seen. The lungs are well aerated bilaterally. No acute bony abnormality is noted. IMPRESSION: No acute abnormality seen. Electronically Signed   By: Inez Catalina M.D.   On: 02/19/2021 19:18    DVT Prophylaxis -SCD/TEDs (Gi Bleed) AM Labs Ordered, also please review Full Orders  Family Communication: Admission, patients condition and plan of care including tests being ordered have been discussed with the patient and daughter at bedside who indicate understanding and agree with the plan   Code Status - Full Code  Likely DC to  home  Condition   -stable  Roxan Hockey M.D on 02/19/2021 at 10:19 PM Go to [email or web address] -  for contact info  [facility] - Office  [phone]

## 2021-02-19 NOTE — ED Provider Notes (Addendum)
[facility] EMERGENCY DEPARTMENT Provider Note   CSN: [id number] Arrival date & time: 02/19/21  1818     History No chief complaint on file.   Hannah French is a 80 y.o. female.  HPI   Patient with history of CHF, CKD, DMT2 and CAD presents with diaphoresis. Started yesterday night, she reports it soaked though her clothes. She reports feeling fatigued and generally unwell today and yesterday. She denies fevers, chest pain or SOB. States this happened again earlier today and her family called EMS. When EMS came, she started screaming and acting odd. That lasted for minutes and resolved. She did not loose consciousness or have any pre-syncopal episodes. She reports nausea and right leg swelling that has been ongoing for weeks.  She underwent cardiac cath in January and had a stent place in the LAD. She is on xarelto and plavix.   Past Medical History:  Diagnosis Date  . Abdominal bruit   . Bladder cancer ([place])   . CAD (coronary artery disease)    a. nonobstructive by cath 08/2018.  . Carotid bruit   . Chronic combined systolic and diastolic CHF (congestive heart failure) ([place])   . CKD (chronic kidney disease), stage II   . Congestive heart failure (CHF) ([place])   . Diabetes mellitus    Type II  . Dyslipidemia   . Heart murmur   . Mild pulmonary hypertension ([place])   . Mitral valve prolapse    a. not seen on most recent echoes. Mild MR now.  Marland Kitchen NICM (nonischemic cardiomyopathy) ([place])   . Osteoporosis   . Pancreatitis, acute   . Pseudogout   . PVD (peripheral vascular disease) ([place])    a. 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  . Sinus tachycardia   . Tobacco abuse     Patient Active Problem List   Diagnosis Date Noted  . Acute respiratory failure with hypoxia ([place]) 01/17/2021  . Elevated MCV 01/17/2021  . Leukocytosis 01/17/2021  . CKD (chronic kidney disease), stage III ([place]) 01/17/2021  . Hyperglycemia due to  diabetes mellitus ([place]) 01/17/2021  . Skin abscess 01/15/2020  . Depression, recurrent ([place]) 08/08/2019  . Cigarette smoker 06/19/2019  . Vitamin D deficiency 01/29/2019  . Dizziness 12/03/2018  . Chronic combined systolic and diastolic CHF (congestive heart failure) ([place]) 12/03/2018  . Coronary artery disease involving native coronary artery of native heart with angina pectoris ([name]) 10/04/2018  . Hyperlipidemia 10/04/2018  . Pain in right hand 10/02/2018  . Myalgia due to statin 03/29/2018  . Stage 3 severe COPD by GOLD classification ([place]) 10/26/2017  . Tobacco abuse 03/07/2016  . Type 2 diabetes mellitus ([place]) 08/26/2015  . Osteoporosis 09/02/2014  . Carotid stenosis 07/22/2012  . PVD 07/02/2009  . BLADDER CANCER 06/05/2009  . Hyperlipidemia associated with type 2 diabetes mellitus ([place]) 06/05/2009  . PSEUDOGOUT 06/05/2009  . MITRAL VALVE PROLAPSE 06/05/2009    Past Surgical History:  Procedure Laterality Date  . ABDOMINAL AORTOGRAM W/LOWER EXTREMITY N/A 12/31/2019   Procedure: ABDOMINAL AORTOGRAM W/LOWER EXTREMITY;  Surgeon: Wellington Hampshire, MD;  Location: [place] [facility];  Service: Cardiovascular;  Laterality: N/A;  . ABDOMINAL AORTOGRAM W/LOWER EXTREMITY N/A 09/01/2020   Procedure: ABDOMINAL AORTOGRAM W/LOWER EXTREMITY;  Surgeon: Wellington Hampshire, MD;  Location: [place] [facility];  Service: Cardiovascular;  Laterality: N/A;  . APPENDECTOMY    . CORONARY STENT INTERVENTION N/A 10/13/2020   Procedure: CORONARY STENT INTERVENTION;  Surgeon: Leonie Man, MD;  Location: [place] [facility];  Service: Cardiovascular;  Laterality: N/A;  . Hysterectomy-type unspecified    . INTRAVASCULAR PRESSURE WIRE/FFR STUDY N/A 10/13/2020   Procedure: INTRAVASCULAR PRESSURE WIRE/FFR STUDY;  Surgeon: Leonie Man, MD;  Location: [place] [facility];  Service: Cardiovascular;  Laterality: N/A;  . LAPAROSCOPIC CHOLECYSTECTOMY  2009  . PERIPHERAL VASCULAR INTERVENTION  12/31/2019    Procedure: PERIPHERAL VASCULAR INTERVENTION;  Surgeon: Wellington Hampshire, MD;  Location: [place] [facility];  Service: Cardiovascular;;  Bilateral [place]  . RIGHT/LEFT HEART CATH AND CORONARY ANGIOGRAPHY N/A 09/16/2018   Procedure: RIGHT/LEFT HEART CATH AND CORONARY ANGIOGRAPHY;  Surgeon: Sherren Mocha, MD;  Location: [place] [facility];  Service: Cardiovascular;  Laterality: N/A;  . RIGHT/LEFT HEART CATH AND CORONARY ANGIOGRAPHY N/A 10/13/2020   Procedure: RIGHT/LEFT HEART CATH AND CORONARY ANGIOGRAPHY;  Surgeon: Jolaine Artist, MD;  Location: [place] [facility];  Service: Cardiovascular;  Laterality: N/A;     OB History   No obstetric history on file.     Family History  Problem Relation Age of Onset  . Heart failure Mother   . Hypertension Sister   . Heart attack Brother   . Heart attack Maternal Uncle   . Heart disease Sister   . COPD Brother   . Congestive Heart Failure Brother   . Congestive Heart Failure Daughter   . Rheum arthritis Son   . Ovarian cancer Sister   . Coronary artery disease Neg Hx        Early    Social History   Tobacco Use  . Smoking status: Current Every Day Smoker    Packs/day: 1.00    Years: 50.00    Pack years: 50.00    Types: Cigarettes  . Smokeless tobacco: Never Used  Vaping Use  . Vaping Use: Never used  Substance Use Topics  . Alcohol use: No  . Drug use: No    Home Medications Prior to Admission medications   Medication Sig Start Date End Date Taking? Authorizing Provider  acetaminophen (TYLENOL) 500 MG tablet Take 500 mg by mouth every 6 (six) hours as needed for moderate pain.    [provider]  albuterol (VENTOLIN HFA) 108 (90 Base) MCG/ACT inhaler Inhale 2 puffs into the lungs every 6 (six) hours as needed for wheezing or shortness of breath. 02/02/21   Martyn Ehrich, NP  Cholecalciferol (VITAMIN D) 50 MCG (2000 UT) tablet Take 4,000 Units by mouth daily.     [provider]  clopidogrel (PLAVIX) 75  MG tablet Take 1 tablet (75 mg total) by mouth daily. 10/13/20   Cheryln Manly, NP  cyclobenzaprine (FLEXERIL) 10 MG tablet TAKE 1 TABLET THREE TIMES DAILY FOR MUSCLE SPASM 01/26/21   Dettinger, Fransisca Kaufmann, MD  doxycycline (VIBRA-TABS) 100 MG tablet Take 1 tablet (100 mg total) by mouth 2 (two) times daily. 02/01/21   Martyn Ehrich, NP  Fluticasone-Umeclidin-Vilant (TRELEGY ELLIPTA) 100-62.5-25 MCG/INH AEPB Inhale 1 puff into the lungs daily. 02/02/21   Martyn Ehrich, NP  furosemide (LASIX) 20 MG tablet Take 2 tablets daily. You may take an extra dose for increase weight gain orswelling 09/09/20   Dettinger, Fransisca Kaufmann, MD  glimepiride (AMARYL) 2 MG tablet Take 1-2 tablets (2-4 mg total) by mouth daily with breakfast. 09/09/20   Dettinger, Fransisca Kaufmann, MD  insulin aspart (NOVOLOG) 100 UNIT/ML injection Inject 5-20 Units into the skin 3 (three) times daily before meals. 02/02/21   Dettinger, Fransisca Kaufmann, MD  insulin degludec (TRESIBA FLEXTOUCH) 100 UNIT/ML FlexTouch Pen Inject 10 Units into the skin daily. 09/09/20   Dettinger, Fransisca Kaufmann, MD  levalbuterol Penne Lash) 0.63 MG/3ML nebulizer solution Take 3 mLs (0.63 mg total) by nebulization every 4 (four) hours as needed for wheezing or shortness of breath. 02/02/21 02/02/22  Martyn Ehrich, NP  metoprolol succinate (TOPROL-XL) 25 MG 24 hr tablet Take 1 tablet (25 mg total) by mouth daily. 03/15/20   Bensimhon, Shaune Pascal, MD  Naphazoline-Pheniramine (OPCON-A) 0.027-0.315 % SOLN Place 1 drop into both eyes daily as needed (Burning and itching eyes).    [provider]  nitroGLYCERIN (NITRODUR - DOSED IN MG/24 HR) 0.2 mg/hr patch Place 1 patch (0.2 mg total) onto the skin daily. 11/02/20   Newt Minion, MD  nitroGLYCERIN (NITROSTAT) 0.3 MG SL tablet Place 1 tablet (0.3 mg total) under the tongue every 5 (five) minutes as needed for chest pain. 02/02/21 02/02/22  Dettinger, Fransisca Kaufmann, MD  pentoxifylline (TRENTAL) 400 MG CR tablet Take 1 tablet (400 mg  total) by mouth 3 (three) times daily with meals. 02/02/21   Dettinger, Fransisca Kaufmann, MD  predniSONE (DELTASONE) 20 MG tablet Take 3 tabs daily for 1 week, then 2 tabs daily for week 2, then 1 tab daily for week 3. 01/24/21   Dettinger, Fransisca Kaufmann, MD  rivaroxaban (XARELTO) 2.5 MG TABS tablet Take 1 tablet (2.5 mg total) by mouth 2 (two) times daily. 02/02/21   Dettinger, Fransisca Kaufmann, MD  traMADol (ULTRAM) 50 MG tablet TAKE 1 TABLET EVERY 6 HOURS AS NEEDED FOR MODERATE PAIN 01/26/21   Suzan Slick, NP  umeclidinium-vilanterol (ANORO ELLIPTA) 62.5-25 MCG/INH AEPB Inhale 1 puff into the lungs daily. 02/01/21   Martyn Ehrich, NP    Allergies    Contrast media [iodinated diagnostic agents], Iohexol, Metformin, Statins, Bextra [valdecoxib], Cefdinir, Cozaar [losartan potassium], Gabapentin, Nsaids, Zetia [ezetimibe], Rofecoxib, and Sulfa antibiotics  Review of Systems   Review of Systems  Constitutional: Positive for diaphoresis and fatigue. Negative for chills and fever.  HENT: Negative for ear pain and sore throat.   Eyes: Negative for pain and visual disturbance.  Respiratory: Negative for cough, chest tightness and shortness of breath.   Cardiovascular: Positive for leg swelling. Negative for chest pain and palpitations.  Gastrointestinal: Positive for nausea. Negative for abdominal pain and vomiting.  Genitourinary: Negative for dysuria and hematuria.  Musculoskeletal: Negative for arthralgias and back pain.  Skin: Negative for color change and rash.  Neurological: Positive for weakness and light-headedness. Negative for seizures and syncope.  Psychiatric/Behavioral: Positive for behavioral problems.  All other systems reviewed and are negative.   Physical Exam Updated Vital Signs There were no vitals taken for this visit.  Physical Exam Vitals and nursing note reviewed. Exam conducted with a chaperone present.  Constitutional:      Appearance: Normal appearance. She is ill-appearing.      Comments: Chronically ill appearing.   HENT:     Head: Normocephalic and atraumatic.  Eyes:     General: No scleral icterus.       Right eye: No discharge.        Left eye: No discharge.     Extraocular Movements: Extraocular movements intact.     Pupils: Pupils are equal, round, and reactive to light.  Cardiovascular:     Rate and Rhythm: Normal rate and regular rhythm.     Pulses: Normal pulses.     Heart sounds: Murmur heard.  No friction rub.  No gallop.      Comments: Radial, PT, DP pulses palpable but weak Pulmonary:     Effort: Pulmonary effort is normal. No respiratory distress.     Breath sounds: Wheezing present.  Abdominal:     General: Abdomen is flat. Bowel sounds are normal. There is no distension.     Palpations: Abdomen is soft.     Tenderness: There is no abdominal tenderness.  Musculoskeletal:        General: No swelling or tenderness. Normal range of motion.     Comments: Right leg grossly the same as left. No pain on palpation. No edema, erythema.   Skin:    General: Skin is warm and dry.     Coloration: Skin is not jaundiced.  Neurological:     General: No focal deficit present.     Mental Status: She is alert and oriented to person, place, and time. Mental status is at baseline.     Cranial Nerves: No cranial nerve deficit.     Coordination: Coordination normal.  Psychiatric:        Mood and Affect: Mood normal.     ED Results / Procedures / Treatments   Labs (all labs ordered are listed, but only abnormal results are displayed) Labs Reviewed - No data to display  EKG None  Radiology No results found.  Procedures Procedures   Medications Ordered in ED Medications - No data to display  ED Course  I have reviewed the triage vital signs and the nursing notes.  Pertinent labs & imaging results that were available during my care of the patient were reviewed by me and considered in my medical decision making (see chart for details).    MDM  Rules/Calculators/A&P                          Patient has a concerning, nebulous story with PMH significant for CHF, diabetes, CKD, CHF. She takes xarelto and plavix. Ordered broad workup to evaluate diaphoresis. DDx includes: ACS, hypoglycemia, UTI, GIB, anemia, arhythmia, PNA. Doubt PE given she is on blood thinners.   Patient CBG shows hypoglycemia to 47. Nurse giving patient juice. Recheck showed 49. Will order an amp of D50.   CBC shows decreased RBC and Hbg from 3 weeks ago. Without an alternative explanation for her bleeding, will check a fecal occult blood test. She is hemocult positive. Ordered pantoprazole.   Will plan for admission for GIB and hypoglycemia.   Consulted GI. They requested give Protonix. Hold Plavix and xarelto. Keep on clear liquid diet overnight.   Discussed HPI, physical exam and plan of care for this patient with attending Noemi Chapel. The attending physician evaluated this patient as part of a shared visit and agrees with plan of care.  Final Clinical Impression(s) / ED Diagnoses Final diagnoses:  None    Rx / DC Orders ED Discharge Orders    None       Sherrill Raring, PA-C 02/19/21 2253    Sherrill Raring, PA-C 02/19/21 2254    Noemi Chapel, MD 02/25/21 1724

## 2021-02-20 DIAGNOSIS — E1122 Type 2 diabetes mellitus with diabetic chronic kidney disease: Secondary | ICD-10-CM | POA: Diagnosis present

## 2021-02-20 DIAGNOSIS — Z8551 Personal history of malignant neoplasm of bladder: Secondary | ICD-10-CM | POA: Diagnosis not present

## 2021-02-20 DIAGNOSIS — I471 Supraventricular tachycardia: Secondary | ICD-10-CM | POA: Diagnosis present

## 2021-02-20 DIAGNOSIS — J449 Chronic obstructive pulmonary disease, unspecified: Secondary | ICD-10-CM | POA: Diagnosis present

## 2021-02-20 DIAGNOSIS — D649 Anemia, unspecified: Secondary | ICD-10-CM | POA: Diagnosis not present

## 2021-02-20 DIAGNOSIS — Z7952 Long term (current) use of systemic steroids: Secondary | ICD-10-CM | POA: Diagnosis not present

## 2021-02-20 DIAGNOSIS — I25119 Atherosclerotic heart disease of native coronary artery with unspecified angina pectoris: Secondary | ICD-10-CM | POA: Diagnosis present

## 2021-02-20 DIAGNOSIS — I447 Left bundle-branch block, unspecified: Secondary | ICD-10-CM | POA: Diagnosis present

## 2021-02-20 DIAGNOSIS — E785 Hyperlipidemia, unspecified: Secondary | ICD-10-CM | POA: Diagnosis present

## 2021-02-20 DIAGNOSIS — E1151 Type 2 diabetes mellitus with diabetic peripheral angiopathy without gangrene: Secondary | ICD-10-CM | POA: Diagnosis present

## 2021-02-20 DIAGNOSIS — Z79899 Other long term (current) drug therapy: Secondary | ICD-10-CM | POA: Diagnosis not present

## 2021-02-20 DIAGNOSIS — E1165 Type 2 diabetes mellitus with hyperglycemia: Secondary | ICD-10-CM | POA: Diagnosis present

## 2021-02-20 DIAGNOSIS — Z794 Long term (current) use of insulin: Secondary | ICD-10-CM | POA: Diagnosis not present

## 2021-02-20 DIAGNOSIS — I5042 Chronic combined systolic (congestive) and diastolic (congestive) heart failure: Secondary | ICD-10-CM | POA: Diagnosis present

## 2021-02-20 DIAGNOSIS — Z20822 Contact with and (suspected) exposure to covid-19: Secondary | ICD-10-CM | POA: Diagnosis present

## 2021-02-20 DIAGNOSIS — K922 Gastrointestinal hemorrhage, unspecified: Secondary | ICD-10-CM | POA: Diagnosis present

## 2021-02-20 DIAGNOSIS — I272 Pulmonary hypertension, unspecified: Secondary | ICD-10-CM | POA: Diagnosis present

## 2021-02-20 DIAGNOSIS — Z955 Presence of coronary angioplasty implant and graft: Secondary | ICD-10-CM | POA: Diagnosis not present

## 2021-02-20 DIAGNOSIS — I708 Atherosclerosis of other arteries: Secondary | ICD-10-CM | POA: Diagnosis present

## 2021-02-20 DIAGNOSIS — R55 Syncope and collapse: Secondary | ICD-10-CM | POA: Diagnosis present

## 2021-02-20 DIAGNOSIS — D62 Acute posthemorrhagic anemia: Secondary | ICD-10-CM | POA: Diagnosis present

## 2021-02-20 DIAGNOSIS — N1832 Chronic kidney disease, stage 3b: Secondary | ICD-10-CM | POA: Diagnosis present

## 2021-02-20 DIAGNOSIS — E11649 Type 2 diabetes mellitus with hypoglycemia without coma: Secondary | ICD-10-CM | POA: Diagnosis present

## 2021-02-20 DIAGNOSIS — Z7902 Long term (current) use of antithrombotics/antiplatelets: Secondary | ICD-10-CM | POA: Diagnosis not present

## 2021-02-20 LAB — COMPREHENSIVE METABOLIC PANEL
ALT: 26 U/L (ref 0–44)
AST: 18 U/L (ref 15–41)
Albumin: 2.8 g/dL — ABNORMAL LOW (ref 3.5–5.0)
Alkaline Phosphatase: 42 U/L (ref 38–126)
Anion gap: 4 — ABNORMAL LOW (ref 5–15)
BUN: 41 mg/dL — ABNORMAL HIGH (ref 8–23)
CO2: 33 mmol/L — ABNORMAL HIGH (ref 22–32)
Calcium: 8 mg/dL — ABNORMAL LOW (ref 8.9–10.3)
Chloride: 100 mmol/L (ref 98–111)
Creatinine, Ser: 1.29 mg/dL — ABNORMAL HIGH (ref 0.44–1.00)
GFR, Estimated: 42 mL/min — ABNORMAL LOW (ref >60)
Glucose, Bld: 89 mg/dL (ref 70–99)
Potassium: 3.9 mmol/L (ref 3.5–5.1)
Sodium: 137 mmol/L (ref 135–145)
Total Bilirubin: 1.5 mg/dL — ABNORMAL HIGH (ref 0.3–1.2)
Total Protein: 4.8 g/dL — ABNORMAL LOW (ref 6.5–8.1)

## 2021-02-20 LAB — GLUCOSE, CAPILLARY
Glucose-Capillary: 160 mg/dL — ABNORMAL HIGH (ref 70–99)
Glucose-Capillary: 34 mg/dL — CL (ref 70–99)
Glucose-Capillary: 82 mg/dL (ref 70–99)
Glucose-Capillary: 71 mg/dL (ref 70–99)
Glucose-Capillary: 161 mg/dL — ABNORMAL HIGH (ref 70–99)
Glucose-Capillary: 251 mg/dL — ABNORMAL HIGH (ref 70–99)

## 2021-02-20 LAB — CBC
HCT: 26.1 % — ABNORMAL LOW (ref 36.0–46.0)
Hemoglobin: 8.2 g/dL — ABNORMAL LOW (ref 12.0–15.0)
MCH: 33.7 pg (ref 26.0–34.0)
MCHC: 31.4 g/dL (ref 30.0–36.0)
MCV: 107.4 fL — ABNORMAL HIGH (ref 80.0–100.0)
Platelets: 195 10*3/uL (ref 150–400)
RBC: 2.43 MIL/uL — ABNORMAL LOW (ref 3.87–5.11)
RDW: 20.1 % — ABNORMAL HIGH (ref 11.5–15.5)
WBC: 7.2 10*3/uL (ref 4.0–10.5)
nRBC: 0 % (ref 0.0–0.2)

## 2021-02-20 LAB — HEMOGLOBIN AND HEMATOCRIT, BLOOD
HCT: 27.7 % — ABNORMAL LOW (ref 36.0–46.0)
Hemoglobin: 8.9 g/dL — ABNORMAL LOW (ref 12.0–15.0)

## 2021-02-20 LAB — TROPONIN I (HIGH SENSITIVITY): Troponin I (High Sensitivity): 103 ng/L (ref <18)

## 2021-02-20 MED ORDER — DEXTROSE 50 % IV SOLN
12.5000 g | INTRAVENOUS | Status: AC
  Administered 2021-02-20: 12.5 g via INTRAVENOUS

## 2021-02-20 MED ORDER — DEXTROSE 50 % IV SOLN
25.0000 g | INTRAVENOUS | Status: AC
  Administered 2021-02-20: 25 g via INTRAVENOUS

## 2021-02-20 MED ORDER — DEXTROSE 5 % IV SOLN: INTRAVENOUS | Status: DC

## 2021-02-20 MED ORDER — NITROGLYCERIN 0.4 MG SL SUBL: 0.4000 mg | SUBLINGUAL_TABLET | SUBLINGUAL | Status: DC | PRN

## 2021-02-20 MED ORDER — DEXTROSE 50 % IV SOLN
INTRAVENOUS | Status: AC
  Filled 2021-02-20: qty 50

## 2021-02-20 NOTE — Consult Note (Signed)
Referring Provider: No ref. provider found Primary Care Physician:  Dettinger, Fransisca Kaufmann, MD Primary Gastroenterologist:  Dr.Emagene Merfeld  Reason for Consultation: Hemoccult positive stool.  HPI: Pleasant 80 year old lady with multiple comorbidities including advanced O2 dependent COPD, combined diastolic/systolic heart failure, CAD, diabetes and significant PAD on Xarelto and Plavix admitted through the hospital yesterday with recurrent episodes of diaphoresis and hypoglycemia. In the ED, she was noted to have brown Hemoccult positive stool on digital rectal examination. She has remained hemodynamically stable.  Hemoglobin was 9.2 on admission 8.2 this morning.  Recent baseline in the 11 range a month ago. Troponins have been elevated but EKG is stable.  Patient absolutely denies any chest or abdominal pain recently. In addition, patient denies chronic GI symptoms of melena, hematochezia abdominal pain odynophagia, dysphagia, early satiety, nausea or vomiting.  She has a good appetite. Denies any prior GI illnesses.  Gallbladder is out.  Reported to have had a episode of pancreatitis in the distant past. No prior GI evaluation.  No prior EGD or colonoscopy.  Patient reports she has been offered CRC screening but declined.  No family history of chronic gastrointestinal illness.  She denies OTC NSAIDs.  States she was placed on Xarelto by her vascular physician the first of this year and underwent coronary stenting.  Her pulmonary and heart doctors told her to "never be put to sleep"    Past Medical History:  Diagnosis Date  . Abdominal bruit   . Bladder cancer ([place])   . CAD (coronary artery disease)    a. nonobstructive by cath 08/2018.  . Carotid bruit   . Chronic combined systolic and diastolic CHF (congestive heart failure) ([place])   . CKD (chronic kidney disease), stage II   . Congestive heart failure (CHF) ([place])   . Diabetes mellitus    Type II  . Dyslipidemia   . Heart murmur    . Mild pulmonary hypertension ([place])   . Mitral valve prolapse    a. not seen on most recent echoes. Mild MR now.  Marland Kitchen NICM (nonischemic cardiomyopathy) ([place])   . Osteoporosis   . Pancreatitis, acute   . Pseudogout   . PVD (peripheral vascular disease) ([place])    a. 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  . Sinus tachycardia   . Tobacco abuse     Past Surgical History:  Procedure Laterality Date  . ABDOMINAL AORTOGRAM W/LOWER EXTREMITY N/A 12/31/2019   Procedure: ABDOMINAL AORTOGRAM W/LOWER EXTREMITY;  Surgeon: Wellington Hampshire, MD;  Location: [facility];  Service: Cardiovascular;  Laterality: N/A;  . ABDOMINAL AORTOGRAM W/LOWER EXTREMITY N/A 09/01/2020   Procedure: ABDOMINAL AORTOGRAM W/LOWER EXTREMITY;  Surgeon: Wellington Hampshire, MD;  Location: [facility];  Service: Cardiovascular;  Laterality: N/A;  . APPENDECTOMY    . CORONARY STENT INTERVENTION N/A 10/13/2020   Procedure: CORONARY STENT INTERVENTION;  Surgeon: Leonie Man, MD;  Location: [facility];  Service: Cardiovascular;  Laterality: N/A;  . Hysterectomy-type unspecified    . INTRAVASCULAR PRESSURE WIRE/FFR STUDY N/A 10/13/2020   Procedure: INTRAVASCULAR PRESSURE WIRE/FFR STUDY;  Surgeon: Leonie Man, MD;  Location: [facility];  Service: Cardiovascular;  Laterality: N/A;  . LAPAROSCOPIC CHOLECYSTECTOMY  2009  . PERIPHERAL VASCULAR INTERVENTION  12/31/2019   Procedure: PERIPHERAL VASCULAR INTERVENTION;  Surgeon: Wellington Hampshire, MD;  Location: [facility];  Service: Cardiovascular;;  Bilateral [place]  . RIGHT/LEFT HEART CATH AND CORONARY ANGIOGRAPHY N/A 09/16/2018  Procedure: RIGHT/LEFT HEART CATH AND CORONARY ANGIOGRAPHY;  Surgeon: Sherren Mocha, MD;  Location: [facility];  Service: Cardiovascular;  Laterality: N/A;  . RIGHT/LEFT HEART CATH AND CORONARY ANGIOGRAPHY N/A 10/13/2020   Procedure: RIGHT/LEFT HEART CATH AND CORONARY  ANGIOGRAPHY;  Surgeon: Jolaine Artist, MD;  Location: [facility];  Service: Cardiovascular;  Laterality: N/A;    Prior to Admission medications   Medication Sig Start Date End Date Taking? Authorizing Provider  acetaminophen (TYLENOL) 500 MG tablet Take 500 mg by mouth every 6 (six) hours as needed for moderate pain.   Yes [provider]  albuterol (VENTOLIN HFA) 108 (90 Base) MCG/ACT inhaler Inhale 2 puffs into the lungs every 6 (six) hours as needed for wheezing or shortness of breath. 02/02/21  Yes Martyn Ehrich, NP  Cholecalciferol (VITAMIN D) 50 MCG (2000 UT) tablet Take 4,000 Units by mouth daily.    Yes [provider]  clopidogrel (PLAVIX) 75 MG tablet Take 1 tablet (75 mg total) by mouth daily. 10/13/20  Yes Cheryln Manly, NP  cyclobenzaprine (FLEXERIL) 10 MG tablet TAKE 1 TABLET THREE TIMES DAILY FOR MUSCLE SPASM Patient taking differently: Take 10 mg by mouth 3 (three) times daily as needed for muscle spasms. 01/26/21  Yes Dettinger, Fransisca Kaufmann, MD  Fluticasone-Umeclidin-Vilant (TRELEGY ELLIPTA) 100-62.5-25 MCG/INH AEPB Inhale 1 puff into the lungs daily. 02/02/21  Yes Martyn Ehrich, NP  furosemide (LASIX) 20 MG tablet Take 2 tablets daily. You may take an extra dose for increase weight gain orswelling 09/09/20  Yes Dettinger, Fransisca Kaufmann, MD  glimepiride (AMARYL) 2 MG tablet Take 1-2 tablets (2-4 mg total) by mouth daily with breakfast. 09/09/20  Yes Dettinger, Fransisca Kaufmann, MD  insulin aspart (NOVOLOG) 100 UNIT/ML injection Inject 5-20 Units into the skin 3 (three) times daily before meals. 02/02/21  Yes Dettinger, Fransisca Kaufmann, MD  insulin degludec (TRESIBA FLEXTOUCH) 100 UNIT/ML FlexTouch Pen Inject 10 Units into the skin daily. 09/09/20  Yes Dettinger, Fransisca Kaufmann, MD  levalbuterol Penne Lash) 0.63 MG/3ML nebulizer solution Take 3 mLs (0.63 mg total) by nebulization every 4 (four) hours as needed for wheezing or shortness of breath. 02/02/21 02/02/22 Yes Martyn Ehrich, NP  metoprolol succinate (TOPROL-XL) 25 MG 24 hr tablet Take 1 tablet (25 mg total) by mouth daily. 03/15/20  Yes Bensimhon, Shaune Pascal, MD  nitroGLYCERIN (NITRODUR - DOSED IN MG/24 HR) 0.2 mg/hr patch Place 1 patch (0.2 mg total) onto the skin daily. 11/02/20  Yes Newt Minion, MD  nitroGLYCERIN (NITROSTAT) 0.3 MG SL tablet Place 1 tablet (0.3 mg total) under the tongue every 5 (five) minutes as needed for chest pain. 02/02/21 02/02/22 Yes Dettinger, Fransisca Kaufmann, MD  pentoxifylline (TRENTAL) 400 MG CR tablet Take 1 tablet (400 mg total) by mouth 3 (three) times daily with meals. 02/02/21  Yes Dettinger, Fransisca Kaufmann, MD  predniSONE (DELTASONE) 20 MG tablet Take 3 tabs daily for 1 week, then 2 tabs daily for week 2, then 1 tab daily for week 3. 01/24/21  Yes Dettinger, Fransisca Kaufmann, MD  rivaroxaban (XARELTO) 2.5 MG TABS tablet Take 1 tablet (2.5 mg total) by mouth 2 (two) times daily. 02/02/21  Yes Dettinger, Fransisca Kaufmann, MD  traMADol (ULTRAM) 50 MG tablet TAKE 1 TABLET EVERY 6 HOURS AS NEEDED FOR MODERATE PAIN Patient taking differently: Take 50 mg by mouth every 12 (twelve) hours as needed. 01/26/21  Yes Suzan Slick, NP  doxycycline (VIBRA-TABS) 100 MG tablet Take 1 tablet (  100 mg total) by mouth 2 (two) times daily. Patient not taking: Reported on 02/19/2021 02/01/21   Martyn Ehrich, NP  insulin lispro (HUMALOG) 100 UNIT/ML injection SMARTSIG:5-20 Unit(s) SUB-Q 3 Times Daily Patient not taking: Reported on 02/19/2021 01/26/21   [provider]  Naphazoline-Pheniramine (OPCON-A) 0.027-0.315 % SOLN Place 1 drop into both eyes daily as needed (Burning and itching eyes). Patient not taking: Reported on 02/19/2021    [provider]  umeclidinium-vilanterol (ANORO ELLIPTA) 62.5-25 MCG/INH AEPB Inhale 1 puff into the lungs daily. Patient not taking: Reported on 02/19/2021 02/01/21   Martyn Ehrich, NP    Current Facility-Administered Medications  Medication Dose Route Frequency Provider  Last Rate Last Admin  . 0.9 %  sodium chloride infusion  250 mL Intravenous PRN Emokpae, Courage, MD      . acetaminophen (TYLENOL) tablet 650 mg  650 mg Oral Q6H PRN Emokpae, Courage, MD       Or  . acetaminophen (TYLENOL) suppository 650 mg  650 mg Rectal Q6H PRN Emokpae, Courage, MD      . albuterol (PROVENTIL) (2.5 MG/3ML) 0.083% nebulizer solution 2.5 mg  2.5 mg Nebulization Q2H PRN Emokpae, Courage, MD      . albuterol (VENTOLIN HFA) 108 (90 Base) MCG/ACT inhaler 2 puff  2 puff Inhalation Q6H PRN Roxan Hockey, MD   2 puff at 02/20/21 0809  . bisacodyl (DULCOLAX) suppository 10 mg  10 mg Rectal Daily PRN Emokpae, Courage, MD      . cholecalciferol (VITAMIN D3) tablet 4,000 Units  4,000 Units Oral Daily Emokpae, Courage, MD      . dextrose 5 % solution   Intravenous Continuous Zierle-Ghosh, Asia B, DO 50 mL/hr at 02/20/21 0412 New Bag at 02/20/21 0412  . dextrose 50 % solution           . insulin aspart (novoLOG) injection 0-6 Units  0-6 Units Subcutaneous TID WC Emokpae, Courage, MD      . metoprolol succinate (TOPROL-XL) 24 hr tablet 25 mg  25 mg Oral Daily Emokpae, Courage, MD      . naphazoline-pheniramine (NAPHCON-A) 0.025-0.3 % ophthalmic solution 1 drop  1 drop Both Eyes Daily PRN Emokpae, Courage, MD      . nitroGLYCERIN (NITRODUR - Dosed in mg/24 hr) patch 0.2 mg  0.2 mg Transdermal Daily Emokpae, Courage, MD      . nitroGLYCERIN (NITROSTAT) SL tablet 0.4 mg  0.4 mg Sublingual Q5 min PRN Shahmehdi, Seyed A, MD      . ondansetron (ZOFRAN) tablet 4 mg  4 mg Oral Q6H PRN Emokpae, Courage, MD       Or  . ondansetron (ZOFRAN) injection 4 mg  4 mg Intravenous Q6H PRN Emokpae, Courage, MD      . pantoprazole (PROTONIX) 80 mg in sodium chloride 0.9 % 100 mL (0.8 mg/mL) infusion  8 mg/hr Intravenous Continuous Emokpae, Courage, MD 10 mL/hr at 02/20/21 0513 8 mg/hr at 02/20/21 0513  . [START ON 02/23/2021] pantoprazole (PROTONIX) injection 40 mg  40 mg Intravenous Q12H Emokpae, Courage, MD       . polyethylene glycol (MIRALAX / GLYCOLAX) packet 17 g  17 g Oral Daily PRN Emokpae, Courage, MD      . sodium chloride flush (NS) 0.9 % injection 3 mL  3 mL Intravenous Q12H Emokpae, Courage, MD      . sodium chloride flush (NS) 0.9 % injection 3 mL  3 mL Intravenous Q12H Roxan Hockey, MD      .  sodium chloride flush (NS) 0.9 % injection 3 mL  3 mL Intravenous PRN Emokpae, Courage, MD      . traZODone (DESYREL) tablet 50 mg  50 mg Oral QHS PRN Emokpae, Courage, MD      . umeclidinium-vilanterol (ANORO ELLIPTA) 62.5-25 MCG/INH 1 puff  1 puff Inhalation Daily Roxan Hockey, MD   1 puff at 02/20/21 0809    Allergies as of 02/19/2021 - Review Complete 02/19/2021  Allergen Reaction Noted  . Contrast media [iodinated diagnostic agents] Shortness Of Breath 10/22/2013  . Iohexol Other (See Comments) 02/10/2004  . Metformin Swelling 06/05/2009  . Statins Swelling and Other (See Comments) 01/10/2016  . Bextra [valdecoxib] Nausea And Vomiting and Swelling 09/10/2018  . Cefdinir Other (See Comments) 10/26/2017  . Cozaar [losartan potassium] Other (See Comments) 01/29/2019  . Gabapentin  10/21/2020  . Nsaids Other (See Comments) 08/13/2018  . Zetia [ezetimibe] Swelling 09/10/2018  . Rofecoxib Other (See Comments) 06/23/2010  . Sulfa antibiotics Nausea And Vomiting and Other (See Comments) 08/13/2018    Family History  Problem Relation Age of Onset  . Heart failure Mother   . Hypertension Sister   . Heart attack Brother   . Heart attack Maternal Uncle   . Heart disease Sister   . COPD Brother   . Congestive Heart Failure Brother   . Congestive Heart Failure Daughter   . Rheum arthritis Son   . Ovarian cancer Sister   . Coronary artery disease Neg Hx        Early    Social History   Socioeconomic History  . Marital status: Divorced    Spouse name: Not on file  . Number of children: 2  . Years of education: Some College   . Highest education level: Some college, no degree   Occupational History  . Occupation: Retired  Tobacco Use  . Smoking status: Current Every Day Smoker    Packs/day: 1.00    Years: 50.00    Pack years: 50.00    Types: Cigarettes  . Smokeless tobacco: Never Used  Vaping Use  . Vaping Use: Never used  Substance and Sexual Activity  . Alcohol use: No  . Drug use: No  . Sexual activity: Not Currently  Other Topics Concern  . Not on file  Social History Narrative  . Not on file   Social Determinants of Health   Financial Resource Strain: Not on file  Food Insecurity: No Food Insecurity  . Worried About Charity fundraiser in the Last Year: Never true  . Ran Out of Food in the Last Year: Never true  Transportation Needs: No Transportation Needs  . Lack of Transportation (Medical): No  . Lack of Transportation (Non-Medical): No  Physical Activity: Not on file  Stress: Not on file  Social Connections: Not on file  Intimate Partner Violence: Not on file    Review of Systems: As in history of present illness   Physical Exam: Vital signs in last 24 hours: Temp:  [97.6 F (36.4 C)-98.1 F (36.7 C)] 98.1 F (36.7 C) (05/29 4098) Pulse Rate:  [80-88] 88 (05/29 0613) Resp:  [18-22] 18 (05/29 0613) BP: (105-119)/(43-53) 105/53 (05/29 0613) SpO2:  [94 %-100 %] 100 % (05/29 1191) Weight:  [50.8 kg] 50.8 kg (05/28 1852)   General:   Spry, conversant frail elderly lady; appears to be in no acute distress whatsoever and very hard of hearing.   No scleral icterus.. Lungs:  Clear distant breath sounds.   Heart:  Regular rate and rhythm; no murmurs, clicks, rubs,  or gallops. Abdomen: Flat.  Positive bowel sounds.  Entirely soft and nontender without appreciable mass  Intake/Output from previous day: No intake/output data recorded. Intake/Output this shift: No intake/output data recorded.  Lab Results: Recent Labs    02/19/21 1941 02/19/21 2326 02/20/21 0400  WBC 9.0  --  7.2  HGB 9.2* 8.9* 8.2*  HCT 28.9* 27.7* 26.1*   PLT 222  --  195   BMET Recent Labs    02/19/21 1941 02/20/21 0400  NA 136 137  K 4.0 3.9  CL 97* 100  CO2 34* 33*  GLUCOSE 55* 89  BUN 51* 41*  CREATININE 1.36* 1.29*  CALCIUM 8.5* 8.0*   LFT Recent Labs    02/20/21 0400  PROT 4.8*  ALBUMIN 2.8*  AST 18  ALT 26  ALKPHOS 42  BILITOT 1.5*    Impression: Pleasant 80 year old lady with advanced coronary/pulmonary/peripheral arterial disease on Plavix and Xarelto admitted with recurrent bouts of hypoglycemia, acute on chronic anemia and Hemoccult positive stool. This lady appears to be displaying occult GI bleeding of obscure etiology.  She has not had any overt bleeding.  Contributing factors likely Plavix and Xarelto.  No prophylactic acid suppression therapy.  No prior GI evaluation including screening colonoscopy.  She could be experiencing blood loss from anywhere along her GI tract.  I discussed the possibilities and the typical work-up for this type of scenario including colonoscopy and possible EGD, etc.  Historically, patient tells me she is declined prior colonoscopy for screenings and continues not to be interested in any such evaluation. Moreover, it is acknowledged that she would be a high risk patient for any invasive procedures/sedation.  ASA 4+. I also explained to her that blood loss may be due to a significant underlying illness ( such as an occult colon cancer-worst-case scenario).  Not undergoing a GI evaluation now could delay making a significant diagnosis which could negatively impact her health.  However, as stated, she is not a candidate for any invasive evaluation/intervention. 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  Recommendations:  Advance to a heart healthy, carbohydrate modified diet  Empiric once daily PPI therapy  Agree with holding Xarelto and Plavix.  From a GI standpoint, I would prefer for her to permanently discontinue Xarelto and continue Plavix as medically  necessary.  Consider transfusion judiciously if hemoglobin < 8.  No plans for endoscopic evaluation.  Could fractionate bilirubin to confirm Gilbert's syndrome

## 2021-02-20 NOTE — Progress Notes (Signed)
Date and time results received: 02/20/21 0850 (use smartphrase ".now" to insert current time)  Test: troponin 103  Critical Value: 103  Name of Provider Notified: Cornelius Moras, MD [facility]   Orders Received? Or Actions Taken?: No new orders

## 2021-02-20 NOTE — Progress Notes (Signed)
PROGRESS NOTE    Patient: Hannah French                            PCP: Dettinger, Fransisca Kaufmann, MD                    DOB: [birth year]            DOA: 02/19/2021 XIP:[id number]             DOS: 02/20/2021, 10:17 AM   LOS: 0 days   Date of Service: The patient was seen and examined on 02/20/2021  Subjective:   The patient was seen and examined this morning. Stable at this time. Still complaining of :  Otherwise no issues overnight .  Brief Narrative:   Hannah French  is a 80 y.o. female  With pmhx relevant for T2DM,hyperlipidemia, coronary artery disease, status post prior angioplasty and stent placement, combined systolic and diastolic heart failure, peripheral vascular diseaseandCOPD on rivaroxaban2.5 mgfor CAD/PADprevention - presents recurrent episodes of frank syncope--- at least 2 episodes over the last 12 hours --when EMS got there her blood sugar was 129 but patient almost lost consciousness was very diaphoretic , very dizzy very nauseous --no emesis she denies chest pains shortness of breath pleuritic symptoms or leg pains    -Patient had episode of hypoglycemia later in the ED are related to her episodes of presyncope -Patient had a Zio patch placed a month ago--Zio patch analysis by Dr. Edwina Barth from 02/07/2021 reveals no significant arrhythmias -No chest pains,, no shortness of breath, no leg pains or pleuritic symptoms -EDP reports heme positive stools on her exam (official FOBT pending) patient baseline hemoglobin usually between 10 and 11-Hemoglobin is down to 9.2 from 11.4 on 01/27/2021 1 -EDP discussed with on-call GI physician Dr. Gala Romney -IV Protonix ordered -We will stop Xarelto, stop Plavix and start Trental -Serial H&H and transfuse as clinically indicated  Troponin 108 >> 112---EKG sinus rhythm with LBBB, No change from prior -Patient remains chest pain-free ,no ACS type symptoms -chest X-ray without acute cardiopulmonary findings --Patient  had episode of hypoglycemia here in the ED after being here for a while with sugar of 55 requiring dextrose infusion (initially when EMS got to patient at her house blood sugar was 129) -BNP 159, creatinine 1.26 which is close to patient's baseline   Assessment & Plan:   Principal Problem:   Acute GI bleeding Active Problems:   Type 2 diabetes mellitus ([place])   Tobacco abuse   Stage 3 severe COPD by GOLD classification ([place])   Coronary artery disease involving native coronary artery of native heart with angina pectoris ([place])   Chronic combined systolic and diastolic CHF (congestive heart failure) ([facility])   Syncope   Acute on chronic blood loss anemia  Recurrent Presyncopal episodes- -multifactorial likely due to hypoglycemia, acute GI bleed -No further episodes .  -Per patient and daughter at least 2 episodes in the last 12 hours  -Patient had episode of hypoglycemia later in the ED are related to her episodes of presyncope -Patient had a Zio patch placed a month ago--Zio patch analysis by Dr. Noni Saupe from 02/07/2021 revealed No significant arrhythmias -No chest pains,, no shortness of breath, no leg pains or pleuritic symptoms   Acute GI bleed:  -Remain n.p.o. -IV fluid hydration -IV Protonix -EDP reports heme positive stools on her exam (official FOBT pending) -Hemoglobin is down to 9.2 from  11.4 on 01/27/2021 patient baseline hemoglobin usually between 10 and 11 -Consulted GI physician Dr. 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 Haroldine Laws cardiologist and also sees Dr. Sharol Given for PAD -c/n   nitroglycerin patch,  --Intolerance to statins, patient is on Repatha Troponin 108 >> 112 >>> 103 --EKG sinus rhythm with LBBB, No change from prior -Patient remains chest pain-free ,no ACS type symptoms -Had episodes of recurrent syncope as noted above  -Hold Plavix, Trental and Xarelto pending GI evaluation due to #1 above -Resuming home medication of metoprolol  Combined Systolic and Diastolic Dysfunction CHF,  --EF from echo of 1/27/2022is40-45%, there was grade 1 diastolic dysfunction and hypokinesis of the left ventricle -Clinically no evidence of overload BNP 179, chest x-ray without acute cardiopulmonary findings -Hold Lasix given concerns about possible GI bleed -Gentle IV fluid hydration, monitoring for volume overload, monitoring daily weight, I's and O's  DM2-  - A1c 9.8 reflecting uncontrolled DM WITH HYPORGLYCEMIA PTA -Patient had episode of hypoglycemia here in the ED after being here for a while with sugar of 55 requiring dextrose infusion (initially when EMS got to patient at her house blood sugar was 129) -Hold Tresiba, stop Amaryl Use Novolog/Humalog Sliding scale insulin with Accu-Cheks/Fingersticks as ordered  CKD IIIB- -appears stable/at baseline at this time --renally adjust medications, avoid nephrotoxic agents / dehydration / hypotension  COPD -no acute exacerbation at this time continue bronchodilators, hold off on steroids especially given GI bleed  Social/ethics---patient is a full code without limitations of treatment -Additional history obtained from patient's daughter at bedside  Disposition/Need for in-Hospital Stay- patient unable to be discharged at this time due to-recurrent presyncopal episodes with concern for GI bleed requiring possible endoluminal evaluation prior to making a decision on whether Xarelto, Plavix and Trental can be restarted  Dispo: The patient is from: Home  Anticipated d/c is to: Home   Anticipated d/c date is: 2 days  Patient currently is not medically stable to d/c.    ---------------------------------------------------------------------------------------------------------------------------------------------------- Cultures; None  Antimicrobials: none   Consultants: GI    ------------------------------------------------------------------------------------------------------------------------------------------------  DVT prophylaxis:  SCD/Compression stockings Code Status:   Code Status: Full Code  Family Communication: No family member present at bedside- attempt will be made to update daily The above findings and plan of care has been discussed with patient (and family)  in detail,  they expressed understanding and agreement of above. -Advance care planning has been discussed.   Admission status:   Status is: Observation  The patient remains OBS appropriate and will d/c before 2 midnights.  Dispo: The patient is from: Home              Anticipated d/c is to: Home              Patient currently is not medically stable to d/c.due to ongoing GI bleed   Difficult to place patient No      Level of care: Telemetry   Procedures:   No admission procedures for hospital encounter.    Antimicrobials:  Anti-infectives (From admission, onward)   None       Medication:  . cholecalciferol  4,000 Units Oral Daily  . dextrose      .  insulin aspart  0-6 Units Subcutaneous TID WC  . metoprolol succinate  25 mg Oral Daily  . nitroGLYCERIN  0.2 mg Transdermal Daily  . [START ON 02/23/2021] pantoprazole  40 mg Intravenous Q12H  . sodium chloride flush  3 mL Intravenous Q12H  . sodium chloride flush  3 mL Intravenous Q12H  . umeclidinium-vilanterol  1 puff Inhalation Daily    sodium chloride, acetaminophen **OR** acetaminophen, albuterol, albuterol, bisacodyl, naphazoline-pheniramine, nitroGLYCERIN, ondansetron **OR** ondansetron (ZOFRAN) IV,  polyethylene glycol, sodium chloride flush, traZODone   Objective:   Vitals:   02/20/21 0542 02/20/21 0613 02/20/21 0809 02/20/21 0810  BP: (!) 111/50 (!) 105/53    Pulse: 80 88    Resp: 18 18    Temp: 97.8 F (36.6 C) 98.1 F (36.7 C)    TempSrc:  Oral    SpO2: 100% 100% 96% 96%  Weight:      Height:       No intake or output data in the 24 hours ending 02/20/21 1017 Filed Weights   02/19/21 1852  Weight: 50.8 kg     Examination:   Physical Exam  Constitution:  Alert, cooperative, no distress,  Appears calm and comfortable  Psychiatric: Normal and stable mood and affect, cognition intact,   HEENT: Normocephalic, PERRL, otherwise with in Normal limits  Chest:Chest symmetric Cardio vascular:  S1/S2, RRR, No murmure, No Rubs or Gallops  pulmonary: Clear to auscultation bilaterally, respirations unlabored, negative wheezes / crackles Abdomen: Soft, non-tender, non-distended, bowel sounds,no masses, no organomegaly Muscular skeletal: Limited exam - in bed, able to move all 4 extremities, Normal strength,  Neuro: CNII-XII intact. , normal motor and sensation, reflexes intact  Extremities: No pitting edema lower extremities, +2 pulses  Skin: Dry, warm to touch, negative for any Rashes, No open wounds Wounds: per nursing documentation    ------------------------------------------------------------------------------------------------------------------------------------------    LABs:  CBC Latest Ref Rng & Units 02/20/2021 02/19/2021 02/19/2021  WBC 4.0 - 10.5 K/uL 7.2 - 9.0  Hemoglobin 12.0 - 15.0 g/dL 8.2(L) 8.9(L) 9.2(L)  Hematocrit 36.0 - 46.0 % 26.1(L) 27.7(L) 28.9(L)  Platelets 150 - 400 K/uL 195 - 222   CMP Latest Ref Rng & Units 02/20/2021 02/19/2021 01/27/2021  Glucose 70 - 99 mg/dL 89 55(L) 407(HH)  BUN 8 - 23 mg/dL 41(H) 51(H) 44(H)  Creatinine 0.44 - 1.00 mg/dL 1.29(H) 1.36(H) 1.57(H)  Sodium 135 - 145 mmol/L 137 136 134  Potassium 3.5 - 5.1 mmol/L 3.9 4.0 4.9   Chloride 98 - 111 mmol/L 100 97(L) 91(L)  CO2 22 - 32 mmol/L 33(H) 34(H) 26  Calcium 8.9 - 10.3 mg/dL 8.0(L) 8.5(L) 9.8  Total Protein 6.5 - 8.1 g/dL 4.8(L) 5.7(L) 6.7  Total Bilirubin 0.3 - 1.2 mg/dL 1.5(H) 1.1 0.5  Alkaline Phos 38 - 126 U/L 42 49 68  AST 15 - 41 U/L 18 20 21   ALT 0 - 44 U/L 26 30 21        Micro Results Recent Results (from the past 240 hour(s))  Resp Panel by RT-PCR (Flu A&B, Covid) Nasopharyngeal Swab     Status: None   Collection Time: 02/19/21  7:30 PM   Specimen: Nasopharyngeal Swab; Nasopharyngeal(NP) swabs in vial transport medium  Result Value Ref Range Status   SARS Coronavirus 2 by RT PCR NEGATIVE NEGATIVE Final    Comment: (NOTE) SARS-CoV-2 target nucleic acids are NOT DETECTED.  The SARS-CoV-2 RNA is generally detectable in upper respiratory specimens during the acute phase of infection. The lowest concentration of SARS-CoV-2  viral copies this assay can detect is 138 copies/mL. A negative result does not preclude SARS-Cov-2 infection and should not be used as the sole basis for treatment or other patient management decisions. A negative result may occur with  improper specimen collection/handling, submission of specimen other than nasopharyngeal swab, presence of viral mutation(s) within the areas targeted by this assay, and inadequate number of viral copies(<138 copies/mL). A negative result must be combined with clinical observations, patient history, and epidemiological information. The expected result is Negative.  Fact Sheet for Patients:  EntrepreneurPulse.com.au  Fact Sheet for Healthcare Providers:  IncredibleEmployment.be  This test is no t yet approved or cleared by the [place] FDA and  has been authorized for detection and/or diagnosis of SARS-CoV-2 by FDA under an Emergency Use Authorization (EUA). This EUA will remain  in effect (meaning this test can be used) for the duration of  the COVID-19 declaration under Section 564(b)(1) of the Act, 21 U.S.C.section 360bbb-3(b)(1), unless the authorization is terminated  or revoked sooner.       Influenza A by PCR NEGATIVE NEGATIVE Final   Influenza B by PCR NEGATIVE NEGATIVE Final    Comment: (NOTE) The Xpert Xpress SARS-CoV-2/FLU/RSV plus assay is intended as an aid in the diagnosis of influenza from Nasopharyngeal swab specimens and should not be used as a sole basis for treatment. Nasal washings and aspirates are unacceptable for Xpert Xpress SARS-CoV-2/FLU/RSV testing.  Fact Sheet for Patients: EntrepreneurPulse.com.au  Fact Sheet for Healthcare Providers: IncredibleEmployment.be  This test is not yet approved or cleared by the [place] FDA and has been authorized for detection and/or diagnosis of SARS-CoV-2 by FDA under an Emergency Use Authorization (EUA). This EUA will remain in effect (meaning this test can be used) for the duration of the COVID-19 declaration under Section 564(b)(1) of the Act, 21 U.S.C. section 360bbb-3(b)(1), unless the authorization is terminated or revoked.  Performed at [facility], [address] [place], [place] [address]     Radiology Reports DG Chest Portable 1 View  Result Date: 02/19/2021 CLINICAL DATA:  Near syncopal event EXAM: PORTABLE CHEST 1 VIEW COMPARISON:  01/19/2021 FINDINGS: Cardiac shadow is stable. Aortic calcifications are again seen. The lungs are well aerated bilaterally. No acute bony abnormality is noted. IMPRESSION: No acute abnormality seen. Electronically Signed   By: Inez Catalina M.D.   On: 02/19/2021 19:18   LONG TERM MONITOR (3-14 DAYS)  Result Date: 02/07/2021 Patch Wear Time:  7 days and 8 hours (2022-04-20T10:37:44-0400 to 2022-04-27T18:51:45-0400) 1. Sinus rhythm - avg HR of 87 bpm. 2. 79 runs of SVT occurred, the run with the fastest interval lasting 9 beats with a max rate of 207 bpm, the longest lasting  22.5 secs with an avg rate of 128 bpm. 3. Rare PACs and PVCs. Glori Bickers, MD 11:14 PM   SIGNED: Deatra James, MD, FHM. [facility],  Pager (please use [email or web address] to page/text) Please use Epic Secure Chat for non-urgent communication (7AM-7PM)  If 7PM-7AM, please contact night-coverage [email or web address], 02/20/2021, 10:17 AM

## 2021-02-20 NOTE — Progress Notes (Signed)
Checked patient CBG around 0300. patient had a critically low CBG.  Initiated hypoglycemic protocol and CBG increased to 160.  Notified MD of hypoglycemic event, and new orders received.  Patient alert and responsive during entire episode.  No complaints at this time.

## 2021-02-21 ENCOUNTER — Encounter (HOSPITAL_COMMUNITY): Payer: Self-pay | Admitting: Family Medicine

## 2021-02-21 DIAGNOSIS — K922 Gastrointestinal hemorrhage, unspecified: Secondary | ICD-10-CM | POA: Diagnosis not present

## 2021-02-21 LAB — GLUCOSE, CAPILLARY
Glucose-Capillary: 63 mg/dL — ABNORMAL LOW (ref 70–99)
Glucose-Capillary: 206 mg/dL — ABNORMAL HIGH (ref 70–99)
Glucose-Capillary: 132 mg/dL — ABNORMAL HIGH (ref 70–99)
Glucose-Capillary: 253 mg/dL — ABNORMAL HIGH (ref 70–99)
Glucose-Capillary: 205 mg/dL — ABNORMAL HIGH (ref 70–99)
Glucose-Capillary: 194 mg/dL — ABNORMAL HIGH (ref 70–99)
Glucose-Capillary: 256 mg/dL — ABNORMAL HIGH (ref 70–99)
Glucose-Capillary: 192 mg/dL — ABNORMAL HIGH (ref 70–99)

## 2021-02-21 LAB — CBC
HCT: 27.9 % — ABNORMAL LOW (ref 36.0–46.0)
Hemoglobin: 8.5 g/dL — ABNORMAL LOW (ref 12.0–15.0)
MCH: 34 pg (ref 26.0–34.0)
MCHC: 30.5 g/dL (ref 30.0–36.0)
MCV: 111.6 fL — ABNORMAL HIGH (ref 80.0–100.0)
Platelets: 203 10*3/uL (ref 150–400)
RBC: 2.5 MIL/uL — ABNORMAL LOW (ref 3.87–5.11)
RDW: 19.8 % — ABNORMAL HIGH (ref 11.5–15.5)
WBC: 9 10*3/uL (ref 4.0–10.5)
nRBC: 0.2 % (ref 0.0–0.2)

## 2021-02-21 MED ORDER — LORAZEPAM 2 MG/ML IJ SOLN
0.5000 mg | Freq: Once | INTRAMUSCULAR | Status: DC
  Filled 2021-02-21: qty 1

## 2021-02-21 MED ORDER — INSULIN ASPART 100 UNIT/ML IJ SOLN
0.0000 [IU] | Freq: Three times a day (TID) | INTRAMUSCULAR | Status: DC
  Administered 2021-02-21: 2 [IU] via SUBCUTANEOUS
  Administered 2021-02-21: 5 [IU] via SUBCUTANEOUS
  Administered 2021-02-22 (×2): 2 [IU] via SUBCUTANEOUS

## 2021-02-21 MED ORDER — MAGNESIUM CITRATE PO SOLN
1.0000 | Freq: Once | ORAL | Status: AC
  Administered 2021-02-21: 1 via ORAL
  Filled 2021-02-21: qty 296

## 2021-02-21 MED ORDER — OXYCODONE HCL 5 MG PO TABS
5.0000 mg | ORAL_TABLET | Freq: Once | ORAL | Status: DC
  Filled 2021-02-21: qty 1

## 2021-02-21 NOTE — Progress Notes (Signed)
   02/21/21 1700  Assess: MEWS Score  BP (!) 92/56  Pulse Rate (!) 102  Level of Consciousness Alert  SpO2 100 %  O2 Device Nasal Cannula  O2 Flow Rate (L/min) 2 L/min  Assess: MEWS Score  MEWS Temp 0  MEWS Systolic 1  MEWS Pulse 1  MEWS RR 0  MEWS LOC 0  MEWS Score 2  MEWS Score Color Yellow  Assess: if the MEWS score is Yellow or Red  Were vital signs taken at a resting state? Yes  Focused Assessment No change from prior assessment  Early Detection of Sepsis Score *See Row Information* Low  MEWS guidelines implemented *See Row Information* Yes  Notify: Charge Nurse/RN  Name of Charge Nurse/RN Notified rachel  Date Charge Nurse/RN Notified 02/21/21  Time Charge Nurse/RN Notified 5  Notify: Provider  Provider Name/Title Shahmehdi  Date Provider Notified 02/21/21  Time Provider Notified 1740  Notification Type Page  Notification Reason Other (Comment) (yellow mews)  Provider response No new orders  Date of Provider Response 02/21/21  Time of Provider Response 1740

## 2021-02-21 NOTE — Progress Notes (Signed)
PROGRESS NOTE    Patient: Hannah French                            PCP: Dettinger, Fransisca Kaufmann, MD                    DOB: 11-14-1940            DOA: 02/19/2021 UKG:[id number]             DOS: 02/21/2021, 11:04 AM   LOS: 1 day   Date of Service: The patient was seen and examined on 02/21/2021  Subjective:   Patient seen and examined this morning, stable no acute distress reporting of no further rectal bleed, complaining of constipation. Denies any abdominal pain chest pain or shortness of breath Hemodynamically stable  Brief Narrative:   Hannah French  is a 80 y.o. female  With pmhx relevant for T2DM,hyperlipidemia, coronary artery disease, status post prior angioplasty and stent placement, combined systolic and diastolic heart failure, peripheral vascular diseaseandCOPD on rivaroxaban2.5 mgfor CAD/PADprevention - presents recurrent episodes of frank syncope--- at least 2 episodes over the last 12 hours --when EMS got there her blood sugar was 129 but patient almost lost consciousness was very diaphoretic , very dizzy very nauseous --no emesis she denies chest pains shortness of breath pleuritic symptoms or leg pains    -Patient had episode of hypoglycemia later in the ED are related to her episodes of presyncope -Patient had a Zio patch placed a month ago--Zio patch analysis by Dr. Edwina Barth from 02/07/2021 reveals no significant arrhythmias -No chest pains,, no shortness of breath, no leg pains or pleuritic symptoms -EDP reports heme positive stools on her exam (official FOBT pending) patient baseline hemoglobin usually between 10 and 11-Hemoglobin is down to 9.2 from 11.4 on 01/27/2021 1 -EDP discussed with on-call GI physician Dr. Gala Romney -IV Protonix ordered -We will stop Xarelto, stop Plavix and start Trental -Serial H&H and transfuse as clinically indicated  Troponin 108 >> 112---EKG sinus rhythm with LBBB, No change from prior -Patient remains chest  pain-free ,no ACS type symptoms -chest X-ray without acute cardiopulmonary findings --Patient had episode of hypoglycemia here in the ED after being here for a while with sugar of 55 requiring dextrose infusion (initially when EMS got to patient at her house blood sugar was 129) -BNP 159, creatinine 1.26 which is close to patient's baseline   Assessment & Plan:   Principal Problem:   Acute GI bleeding Active Problems:   Type 2 diabetes mellitus ([place])   Tobacco abuse   Stage 3 severe COPD by GOLD classification ([place])   Coronary artery disease involving native coronary artery of native heart with angina pectoris ([place])   Chronic combined systolic and diastolic CHF (congestive heart failure) ([facility])   Syncope   Acute on chronic blood loss anemia  Recurrent Presyncopal episodes- -multifactorial likely due to hypoglycemia, acute GI bleed -Improved hyporglycemia,  -Per patient and daughter at least 2 episodes in the last 12 hours  -Patient had episode of hypoglycemia later in the ED are related to her episodes of presyncope -Patient had a Zio patch placed a month ago--Zio patch analysis by Dr. Noni Saupe from 02/07/2021 revealed No significant arrhythmias -No chest pains,, no shortness of breath, no leg pains or pleuritic symptoms   Acute GI bleed:  -Status post GI evaluation, advancing diet -IV fluid hydration--with D5 half-normal saline discontinued tolerating p.o. -IV Protonix -  EDP reports heme positive stools on her exam (official FOBT pending) -Hemoglobin is down to 9.2 from 11.4 on 01/27/2021 >>> 9.2, 8.9, 8.2 -Consulted GI physician Dr. 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 Haroldine Laws cardiologist and also sees Dr. Sharol Given for PAD -c/n  nitroglycerin patch,  --Intolerance to statins, patient is on Repatha Troponin 108 >> 112 >>> 103 --EKG sinus rhythm with LBBB, No change from prior -Patient remains chest pain-free ,no ACS type symptoms -Had episodes of recurrent syncope as noted above  -Hold Plavix, Trental and Xarelto pending GI evaluation as above -Resuming home medication of metoprolol  Combined Systolic and Diastolic Dysfunction CHF,  -stable  -EF from echo of 1/27/2022is40-45%, there was grade 1 diastolic dysfunction and hypokinesis of the left ventricle -Clinically no evidence of overload BNP 179, chest x-ray without acute cardiopulmonary findings -Hold Lasix given concerns about possible GI bleed -Gentle IV fluid hydration, monitoring for volume overload, monitoring daily weight, I's and O's  DM2-  - A1c 9.8 reflecting uncontrolled DM WITH HYPORGLYCEMIA PTA -Patient had episode of hypoglycemia here in the ED after being here for a while with sugar of 55 requiring dextrose infusion (initially when EMS got to patient at her house blood sugar was 129) -Hold Tresiba, stop Amaryl -D5 half-normal saline was discontinued yesterday -CBG: 132, 253, 205, 194 now Use Novolog/Humalog Sliding scale insulin with Accu-Cheks/Fingersticks as ordered  CKD IIIB- -appears stable/at baseline at this time --renally adjust medications, avoid nephrotoxic agents / dehydration / hypotension  COPD -no acute exacerbation at this time continue bronchodilators, hold off on steroids especially given GI bleed  Social/ethics---patient is a full code without limitations of treatment -Additional history obtained from patient's daughter at bedside         ---------------------------------------------------------------------------------------------------------------------------------------------------- Cultures; None  Antimicrobials: none   Consultants: GI    ------------------------------------------------------------------------------------------------------------------------------------------------  DVT prophylaxis:  SCD/Compression stockings Code Status:   Code Status: Full Code  Family Communication: Discussed with patient, called daughter at [phone] Was not available but could not leave a message The above findings and plan of care has been discussed with patient (and family)  in detail,  they expressed understanding and agreement of above. -Advance care planning has been discussed.   Admission status:   Status is: Inpatient Dispo: The patient is from: Home              Anticipated d/c is to: Home in 1-2 days              Patient currently is not medically stable to d/c.due to ongoing GI bleed   Difficult to place patient No   Disposition/Need for in-Hospital Stay- patient unable to be discharged at this time due to-recurrent presyncopal episodes with concern for GI bleed requiring possible endoluminal evaluation prior to making a decision on whether Xarelto, Plavix and Trental can be restarted   Level of care: Med-Surg   Procedures:   No admission procedures for hospital encounter.    Antimicrobials:  Anti-infectives (From admission, onward)   None       Medication:  . cholecalciferol  4,000 Units Oral Daily  . insulin aspart  0-9 Units Subcutaneous TID WC  .  LORazepam  0.5 mg Intravenous Once  . magnesium citrate  1 Bottle Oral Once  . metoprolol succinate  25 mg Oral Daily  . nitroGLYCERIN  0.2 mg Transdermal Daily  . oxyCODONE  5 mg Oral Once  . [START ON 02/23/2021] pantoprazole  40 mg Intravenous Q12H  . sodium chloride flush  3 mL Intravenous Q12H  . sodium chloride flush  3 mL  Intravenous Q12H  . umeclidinium-vilanterol  1 puff Inhalation Daily    sodium chloride, acetaminophen **OR** acetaminophen, albuterol, albuterol, bisacodyl, naphazoline-pheniramine, nitroGLYCERIN, ondansetron **OR** ondansetron (ZOFRAN) IV, polyethylene glycol, sodium chloride flush, traZODone   Objective:   Vitals:   02/21/21 0541 02/21/21 0628 02/21/21 0727 02/21/21 0805  BP:  119/63  (!) 104/48  Pulse:  100  90  Resp:  17    Temp:  98.2 F (36.8 C)    TempSrc:  Oral    SpO2:  97% 97%   Weight: 53 kg     Height:        Intake/Output Summary (Last 24 hours) at 02/21/2021 1104 Last data filed at 02/21/2021 0900 Gross per 24 hour  Intake 929.81 ml  Output 200 ml  Net 729.81 ml   Filed Weights   02/19/21 1852 02/21/21 0541  Weight: 50.8 kg 53 kg     Examination:      Physical Exam:   General:  Alert, oriented, cooperative, no distress;   HEENT:  Normocephalic, PERRL, otherwise with in Normal limits   Neuro:  CNII-XII intact. , normal motor and sensation, reflexes intact   Lungs:   Clear to auscultation BL, Respirations unlabored, no wheezes / crackles  Cardio:    S1/S2, RRR, No murmure, No Rubs or Gallops   Abdomen:   Soft, non-tender, bowel sounds active all four quadrants,  no guarding or peritoneal signs.  Muscular skeletal:  Limited exam - in bed, able to move all 4 extremities, Normal strength,  2+ pulses,  symmetric, No pitting edema  Skin:  Dry, warm to touch, negative for any Rashes,  Wounds: Please see nursing documentation         ------------------------------------------------------------------------------------------------------------------------------------------    LABs:  CBC Latest Ref Rng & Units 02/20/2021 02/19/2021 02/19/2021  WBC 4.0 - 10.5 K/uL 7.2 - 9.0  Hemoglobin 12.0 - 15.0 g/dL 8.2(L) 8.9(L) 9.2(L)  Hematocrit 36.0 - 46.0 % 26.1(L) 27.7(L) 28.9(L)  Platelets 150 - 400 K/uL 195 - 222   CMP Latest Ref Rng & Units 02/20/2021  02/19/2021 01/27/2021  Glucose 70 - 99 mg/dL 89 55(L) 407(HH)  BUN 8 - 23 mg/dL 41(H) 51(H) 44(H)  Creatinine 0.44 - 1.00 mg/dL 1.29(H) 1.36(H) 1.57(H)  Sodium 135 - 145 mmol/L 137 136 134  Potassium 3.5 - 5.1 mmol/L 3.9 4.0 4.9  Chloride 98 - 111 mmol/L 100 97(L) 91(L)  CO2 22 - 32 mmol/L 33(H) 34(H) 26  Calcium 8.9 - 10.3 mg/dL 8.0(L) 8.5(L) 9.8  Total Protein 6.5 - 8.1 g/dL 4.8(L) 5.7(L) 6.7  Total Bilirubin 0.3 - 1.2 mg/dL 1.5(H) 1.1 0.5  Alkaline Phos 38 - 126 U/L 42 49 68  AST 15 - 41 U/L 18 20 21   ALT 0 - 44 U/L 26 30 21        Micro Results Recent Results (from the past 240 hour(s))  Resp Panel by RT-PCR (Flu A&B, Covid) Nasopharyngeal Swab     Status: None   Collection Time: 02/19/21  7:30 PM   Specimen: Nasopharyngeal Swab; Nasopharyngeal(NP) swabs in vial transport medium  Result Value  Ref Range Status   SARS Coronavirus 2 by RT PCR NEGATIVE NEGATIVE Final    Comment: (NOTE) SARS-CoV-2 target nucleic acids are NOT DETECTED.  The SARS-CoV-2 RNA is generally detectable in upper respiratory specimens during the acute phase of infection. The lowest concentration of SARS-CoV-2 viral copies this assay can detect is 138 copies/mL. A negative result does not preclude SARS-Cov-2 infection and should not be used as the sole basis for treatment or other patient management decisions. A negative result may occur with  improper specimen collection/handling, submission of specimen other than nasopharyngeal swab, presence of viral mutation(s) within the areas targeted by this assay, and inadequate number of viral copies(<138 copies/mL). A negative result must be combined with clinical observations, patient history, and epidemiological information. The expected result is Negative.  Fact Sheet for Patients:  EntrepreneurPulse.com.au  Fact Sheet for Healthcare Providers:  IncredibleEmployment.be  This test is no t yet approved or cleared by the  [place] FDA and  has been authorized for detection and/or diagnosis of SARS-CoV-2 by FDA under an Emergency Use Authorization (EUA). This EUA will remain  in effect (meaning this test can be used) for the duration of the COVID-19 declaration under Section 564(b)(1) of the Act, 21 U.S.C.section 360bbb-3(b)(1), unless the authorization is terminated  or revoked sooner.       Influenza A by PCR NEGATIVE NEGATIVE Final   Influenza B by PCR NEGATIVE NEGATIVE Final    Comment: (NOTE) The Xpert Xpress SARS-CoV-2/FLU/RSV plus assay is intended as an aid in the diagnosis of influenza from Nasopharyngeal swab specimens and should not be used as a sole basis for treatment. Nasal washings and aspirates are unacceptable for Xpert Xpress SARS-CoV-2/FLU/RSV testing.  Fact Sheet for Patients: EntrepreneurPulse.com.au  Fact Sheet for Healthcare Providers: IncredibleEmployment.be  This test is not yet approved or cleared by the [place] FDA and has been authorized for detection and/or diagnosis of SARS-CoV-2 by FDA under an Emergency Use Authorization (EUA). This EUA will remain in effect (meaning this test can be used) for the duration of the COVID-19 declaration under Section 564(b)(1) of the Act, 21 U.S.C. section 360bbb-3(b)(1), unless the authorization is terminated or revoked.  Performed at [facility], [address], [place], [place] [address]     Radiology Reports DG Chest Portable 1 View  Result Date: 02/19/2021 CLINICAL DATA:  Near syncopal event EXAM: PORTABLE CHEST 1 VIEW COMPARISON:  01/19/2021 FINDINGS: Cardiac shadow is stable. Aortic calcifications are again seen. The lungs are well aerated bilaterally. No acute bony abnormality is noted. IMPRESSION: No acute abnormality seen. Electronically Signed   By: Inez Catalina M.D.   On: 02/19/2021 19:18   LONG TERM MONITOR (3-14 DAYS)  Result Date: 02/07/2021 Patch Wear Time:  7 days and  8 hours (2022-04-20T10:37:44-0400 to 2022-04-27T18:51:45-0400) 1. Sinus rhythm - avg HR of 87 bpm. 2. 79 runs of SVT occurred, the run with the fastest interval lasting 9 beats with a max rate of 207 bpm, the longest lasting 22.5 secs with an avg rate of 128 bpm. 3. Rare PACs and PVCs. Glori Bickers, MD 11:14 PM   SIGNED: Deatra James, MD, FHM. [facility],  Pager (please use [email or web address] to page/text) Please use Epic Secure Chat for non-urgent communication (7AM-7PM)  If 7PM-7AM, please contact night-coverage [email or web address], 02/21/2021, 11:04 AM

## 2021-02-22 ENCOUNTER — Encounter: Payer: Self-pay | Admitting: Family Medicine

## 2021-02-22 ENCOUNTER — Telehealth: Payer: Self-pay | Admitting: Gastroenterology

## 2021-02-22 ENCOUNTER — Other Ambulatory Visit: Payer: Self-pay

## 2021-02-22 ENCOUNTER — Encounter (HOSPITAL_COMMUNITY): Payer: Self-pay

## 2021-02-22 ENCOUNTER — Telehealth (HOSPITAL_COMMUNITY): Payer: Self-pay | Admitting: *Deleted

## 2021-02-22 DIAGNOSIS — R195 Other fecal abnormalities: Secondary | ICD-10-CM

## 2021-02-22 DIAGNOSIS — D649 Anemia, unspecified: Secondary | ICD-10-CM

## 2021-02-22 DIAGNOSIS — K922 Gastrointestinal hemorrhage, unspecified: Secondary | ICD-10-CM | POA: Diagnosis not present

## 2021-02-22 LAB — COMPREHENSIVE METABOLIC PANEL
ALT: 25 U/L (ref 0–44)
AST: 17 U/L (ref 15–41)
Albumin: 3.2 g/dL — ABNORMAL LOW (ref 3.5–5.0)
Alkaline Phosphatase: 48 U/L (ref 38–126)
Anion gap: 5 (ref 5–15)
BUN: 39 mg/dL — ABNORMAL HIGH (ref 8–23)
CO2: 29 mmol/L (ref 22–32)
Calcium: 8.3 mg/dL — ABNORMAL LOW (ref 8.9–10.3)
Chloride: 103 mmol/L (ref 98–111)
Creatinine, Ser: 1.13 mg/dL — ABNORMAL HIGH (ref 0.44–1.00)
GFR, Estimated: 49 mL/min — ABNORMAL LOW (ref >60)
Glucose, Bld: 192 mg/dL — ABNORMAL HIGH (ref 70–99)
Potassium: 4.3 mmol/L (ref 3.5–5.1)
Sodium: 137 mmol/L (ref 135–145)
Total Bilirubin: 1.4 mg/dL — ABNORMAL HIGH (ref 0.3–1.2)
Total Protein: 5.5 g/dL — ABNORMAL LOW (ref 6.5–8.1)

## 2021-02-22 LAB — IRON AND TIBC
Iron: 46 ug/dL (ref 28–170)
Saturation Ratios: 15 % (ref 10.4–31.8)
TIBC: 310 ug/dL (ref 250–450)
UIBC: 264 ug/dL

## 2021-02-22 LAB — CBC
HCT: 27.1 % — ABNORMAL LOW (ref 36.0–46.0)
Hemoglobin: 8.2 g/dL — ABNORMAL LOW (ref 12.0–15.0)
MCH: 33.7 pg (ref 26.0–34.0)
MCHC: 30.3 g/dL (ref 30.0–36.0)
MCV: 111.5 fL — ABNORMAL HIGH (ref 80.0–100.0)
Platelets: 198 10*3/uL (ref 150–400)
RBC: 2.43 MIL/uL — ABNORMAL LOW (ref 3.87–5.11)
RDW: 19.5 % — ABNORMAL HIGH (ref 11.5–15.5)
WBC: 9.8 10*3/uL (ref 4.0–10.5)
nRBC: 0 % (ref 0.0–0.2)

## 2021-02-22 LAB — GLUCOSE, CAPILLARY
Glucose-Capillary: 187 mg/dL — ABNORMAL HIGH (ref 70–99)
Glucose-Capillary: 187 mg/dL — ABNORMAL HIGH (ref 70–99)

## 2021-02-22 LAB — VITAMIN B12: Vitamin B-12: 345 pg/mL (ref 180–914)

## 2021-02-22 LAB — BILIRUBIN, DIRECT: Bilirubin, Direct: 0.1 mg/dL (ref 0.0–0.2)

## 2021-02-22 LAB — FERRITIN: Ferritin: 59 ng/mL (ref 11–307)

## 2021-02-22 LAB — FOLATE: Folate: 13.3 ng/mL (ref >5.9)

## 2021-02-22 MED ORDER — PANTOPRAZOLE SODIUM 40 MG PO TBEC
40.0000 mg | DELAYED_RELEASE_TABLET | Freq: Two times a day (BID) | ORAL | Status: DC
  Administered 2021-02-22: 40 mg via ORAL
  Filled 2021-02-22: qty 1

## 2021-02-22 MED ORDER — FERROUS SULFATE 325 (65 FE) MG PO TABS: 325.0000 mg | ORAL_TABLET | Freq: Every day | ORAL | Status: DC

## 2021-02-22 MED ORDER — PANTOPRAZOLE SODIUM 40 MG PO TBEC: 40.0000 mg | DELAYED_RELEASE_TABLET | Freq: Two times a day (BID) | ORAL | 1 refills | Status: DC

## 2021-02-22 MED ORDER — SENNOSIDES-DOCUSATE SODIUM 8.6-50 MG PO TABS
1.0000 | ORAL_TABLET | Freq: Every day | ORAL | Status: DC
  Administered 2021-02-22: 1 via ORAL
  Filled 2021-02-22: qty 1

## 2021-02-22 MED ORDER — SENNOSIDES-DOCUSATE SODIUM 8.6-50 MG PO TABS: 1.0000 | ORAL_TABLET | Freq: Every day | ORAL | 0 refills | Status: AC

## 2021-02-22 MED ORDER — FERROUS SULFATE 325 (65 FE) MG PO TABS: 325.0000 mg | ORAL_TABLET | Freq: Every day | ORAL | 1 refills | Status: DC

## 2021-02-22 NOTE — Discharge Summary (Signed)
Physician Discharge Summary [facility]    Patient: Hannah French                   Admit date: 02/19/2021   DOB: 1941-02-22             Discharge date:02/22/2021/12:35 PM CHE:[id number]                          PCP: Dettinger, Fransisca Kaufmann, MD  Disposition:  HOME with Home Health   Recommendations for Outpatient Follow-up:   . Follow-up with primary cardiology regarding chronic anticoagulant therapy, Xarelto was discontinued on this admission due to GI bleed . She is to continue Plavix in 1 to 2 days . Follow-up with PCP in 1-2 weeks . She is scheduled to follow-up with gastroenterologist as an outpatient I  1 wk . Outpatient lab work-up : CBC within 4-5 days, hemoglobin today 8.2  Discharge Condition: Stable   Code Status:   Code Status: Full Code  Diet recommendation: Diabetic diet   Discharge Diagnoses:    Principal Problem:   Acute GI bleeding Active Problems:   Type 2 diabetes mellitus ([place])   Tobacco abuse   Stage 3 severe COPD by GOLD classification ([place])   Coronary artery disease involving native coronary artery of native heart with angina pectoris (HCC)   Chronic combined systolic and diastolic CHF (congestive heart failure) ([place])   Syncope   Acute on chronic blood loss anemia   History of Present Illness/ Hospital Course Hannah French Summary:    Hannah JeanMorrisonis a79 y.o.femaleWith pmhxrelevant forT2DM,hyperlipidemia, coronary artery disease,status post prior angioplasty and stent placement,combined systolic and diastolic heart failure, peripheral vascular diseaseandCOPD on rivaroxaban2.5 mgfor CAD/PADprevention - presentsrecurrent episodes of frank syncope---at least 2 episodes over the last 12 hours --when EMS got there her blood sugar was 129 but patient almost lost consciousness was very diaphoretic,very dizzy very nauseous--no emesis she denies chest pains shortness of breath pleuritic symptoms or leg  pains   -Patient had episode of hypoglycemia later in the ED are related to her episodes of presyncope -Patient had a Zio patch placed a month ago--Zio patch analysis by Dr. Roetta Sessions 02/07/2021 reveals no significant arrhythmias -No chest pains,,no shortness of breath,no leg pains or pleuritic symptoms -EDP reports heme positive stools on her exam (official FOBTpending) patient baseline hemoglobin usually between 10 and 11-Hemoglobin is down to 9.2 from 11.4 on 01/27/2021 1 -EDP discussed with on-call GI physician Dr. Gala Romney -We will stop Xarelto, held Plavix and start Trental   Troponin 108 >> 112---EKG sinus rhythm with LBBB, Nochange from prior -Patient remains chest pain-free,no ACS type symptoms -chestX-ray without acute cardiopulmonary findings --Patient had episode of hypoglycemia here in the ED after being here for a while with sugar of 55 requiring dextrose infusion(initially when EMS got to patient at her house blood sugar was 129) -BNP 159,creatinine 1.26 which is close to patient's baseline    Recurrent Presyncopalepisodes- -multifactorial likely due to hypoglycemia, acute GI bleed -Improved hyporglycemia,  -Presented with 2 episodes of presyncope on admission  -Patient had episode of hypoglycemia later in the ED are related to her episodes of presyncope -Patient had a Zio patch placed a month ago--Zio patch analysis by Dr. [name] 02/07/2021 revealed No significant arrhythmias -No chest pains,,no shortness of breath,no leg pains or pleuritic symptoms   Acute GI bleed:  -Monitoring H&H currently stable but down from baseline.. but stable    -Improved-status  post IV fluid hydration--with D5 half-normal saline discontinued tolerating p.o. -IV Protonix --switch to p.o. Protonix 40 BID  -EDP reports heme positive stools  -Hemoglobin is down to 9.2 from 11.4 on 01/27/2021 >>> 9.2, 8.9, 8.2,  -Consulted GI physician Dr. 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 Haroldine Laws cardiologist and also sees Dr. Sharol Given for PAD -c/nnitroglycerinpatch, --Intolerance to statins, patient is on Repatha Troponin 108 >> 112 >>> 103 --EKG sinus rhythm with LBBB, Nochange from prior -Patient remains chest pain-free,no ACS type symptoms -Had episodes of recurrent syncope as noted above  -Resume Plavix,  -Discontinued Xarelto -patient instructed to discuss with her cardiology regarding reinitiating Xarelto -Resuming home medication of metoprolol  CombinedSystolic andDiastolicDysfunction CHF,  -Stable no signs of volume overload -EF from echo of 1/27/2022is40-45%, there was grade 1 diastolic dysfunction and hypokinesis of the left ventricle -Clinically no evidence of overload BNP 179,chest x-ray without acute cardiopulmonary findings -Hold Lasix given concerns about possible GI bleed -Gentle IV fluid hydration, monitoring for volume overload, monitoring daily weight, I's and O's  DM2-  - A1c9.8 reflecting uncontrolled DMWITH HYPORGLYCEMIAPTA -Patient had episode of hypoglycemia here in the ED after being here for a while with sugar of 55 requiring dextrose infusion(initially when EMS got to patient at her house blood sugar was 129) -Restarting Tresiba, Amaryl -D5 half-normal saline was discontinued yesterday -CBG: 132, 253, 205, 194 now Use Novolog/Humalog Sliding scale insulin with Accu-Cheks/Fingersticks as ordered  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, stable --renally adjust medications, avoid nephrotoxic  agents / dehydration / hypotension  COPD -no acute exacerbation at this time continue bronchodilators, hold off on steroids especially given GI bleed  Social/ethics---patient is a full code without limitations of treatment -Additional history obtained from patient's daughterat bedside   ---------------------------------------------------------------------------------------------------------------------------------------  Consultants: GI    --------------------------------------------------------------------------------------------------------------------------------------------  Code Status:   Code Status: Full Code  Family Communication:  Discussed with patient, called son Mr. Lashayla Armes ( the sons number [phone] updated 02/22/2025)  The above findings and plan of care has been discussed with patient (and family)  in detail,  they expressed understanding and agreement of above. -Advance care planning has been discussed.   Admission status:  Status is: Inpatient Dispo: The patient is from: Home  Anticipated d/c is to: Home  w [facility]      Discharge Instructions:   Discharge Instructions    Activity as tolerated - No restrictions   Complete by: As directed    Diet - low sodium heart healthy   Complete by: As directed    Discharge instructions   Complete by: As directed    Follow-up with PCP, gastroenterologist in 1-2 weeks Follow-up with your cardiologist regarding discontinuation of Xarelto Resume Plavix in 1 to 2 days Continue taking Protonix 40 mg twice a day   Increase activity slowly   Complete by: As directed        Medication List    STOP taking these medications   Anoro Ellipta 62.5-25 MCG/INH Aepb Generic drug: umeclidinium-vilanterol   cyclobenzaprine 10 MG tablet Commonly known as: FLEXERIL   doxycycline 100 MG tablet Commonly known as: VIBRA-TABS   rivaroxaban 2.5 MG  Tabs tablet Commonly known as: XARELTO    traMADol 50 MG tablet Commonly known as: ULTRAM     TAKE these medications   acetaminophen 500 MG tablet Commonly known as: TYLENOL Take 500 mg by mouth every 6 (six) hours as needed for moderate pain.   albuterol 108 (90 Base) MCG/ACT inhaler Commonly known as: VENTOLIN HFA Inhale 2 puffs into the lungs every 6 (six) hours as needed for wheezing or shortness of breath.   clopidogrel 75 MG tablet Commonly known as: Plavix Take 1 tablet (75 mg total) by mouth daily.   ferrous sulfate 325 (65 FE) MG tablet Take 1 tablet (325 mg total) by mouth daily with breakfast. Start taking on: February 23, 2021   furosemide 20 MG tablet Commonly known as: LASIX Take 2 tablets daily. You may take an extra dose for increase weight gain orswelling   glimepiride 2 MG tablet Commonly known as: AMARYL Take 1-2 tablets (2-4 mg total) by mouth daily with breakfast.   insulin aspart 100 UNIT/ML injection Commonly known as: novoLOG Inject 5-20 Units into the skin 3 (three) times daily before meals.   insulin lispro 100 UNIT/ML injection Commonly known as: HUMALOG SMARTSIG:5-20 Unit(s) SUB-Q 3 Times Daily   levalbuterol 0.63 MG/3ML nebulizer solution Commonly known as: XOPENEX Take 3 mLs (0.63 mg total) by nebulization every 4 (four) hours as needed for wheezing or shortness of breath.   metoprolol succinate 25 MG 24 hr tablet Commonly known as: TOPROL-XL Take 1 tablet (25 mg total) by mouth daily.   nitroGLYCERIN 0.2 mg/hr patch Commonly known as: NITRODUR - Dosed in mg/24 hr Place 1 patch (0.2 mg total) onto the skin daily.   nitroGLYCERIN 0.3 MG SL tablet Commonly known as: Nitrostat Place 1 tablet (0.3 mg total) under the tongue every 5 (five) minutes as needed for chest pain.   Opcon-A 0.027-0.315 % Soln Generic drug: Naphazoline-Pheniramine Place 1 drop into both eyes daily as needed (Burning and itching eyes).   pantoprazole 40 MG tablet Commonly known as: PROTONIX Take 1 tablet  (40 mg total) by mouth 2 (two) times daily for 20 days.   pentoxifylline 400 MG CR tablet Commonly known as: TRENTAL Take 1 tablet (400 mg total) by mouth 3 (three) times daily with meals.   predniSONE 20 MG tablet Commonly known as: DELTASONE Take 3 tabs daily for 1 week, then 2 tabs daily for week 2, then 1 tab daily for week 3.   senna-docusate 8.6-50 MG tablet Commonly known as: Senokot-S Take 1 tablet by mouth at bedtime. Start taking on: February 23, 2021   Trelegy Ellipta 100-62.5-25 MCG/INH Aepb Generic drug: Fluticasone-Umeclidin-Vilant Inhale 1 puff into the lungs daily.   Tyler Aas FlexTouch 100 UNIT/ML FlexTouch Pen Generic drug: insulin degludec Inject 10 Units into the skin daily.   Vitamin D 50 MCG (2000 UT) tablet Take 4,000 Units by mouth daily.       Allergies  Allergen Reactions  . Contrast Media [Iodinated Diagnostic Agents] Shortness Of Breath  . Iohexol Other (See Comments)    PASSED OUT DURING THE TEST    . Metformin Swelling    Face and hands became swollen  . Statins Swelling and Other (See Comments)    They make the patient hurt "all over" Myalgia, also  . Bextra [Valdecoxib] Nausea And Vomiting and Swelling    Shut down my kidneys   . Cefdinir Other (See Comments)    Pancreatitis   . Cozaar [Losartan Potassium] Other (See Comments)  dizziness  . Gabapentin   . Nsaids Other (See Comments)    GI intolerance and pain all over, tolerates ibu   . Zetia [Ezetimibe] Swelling  . Rofecoxib Other (See Comments)    "VIOXX"- "SHUT DOWN MY KIDNEYS"  . Sulfa Antibiotics Nausea And Vomiting and Other (See Comments)    Pancreatitis     Procedures /Studies:   DG Chest Portable 1 View  Result Date: 02/19/2021 CLINICAL DATA:  Near syncopal event EXAM: PORTABLE CHEST 1 VIEW COMPARISON:  01/19/2021 FINDINGS: Cardiac shadow is stable. Aortic calcifications are again seen. The lungs are well aerated bilaterally. No acute bony abnormality is noted.  IMPRESSION: No acute abnormality seen. Electronically Signed   By: Inez Catalina M.D.   On: 02/19/2021 19:18   LONG TERM MONITOR (3-14 DAYS)  Result Date: 02/07/2021 Patch Wear Time:  7 days and 8 hours (2022-04-20T10:37:44-0400 to 2022-04-27T18:51:45-0400) 1. Sinus rhythm - avg HR of 87 bpm. 2. 79 runs of SVT occurred, the run with the fastest interval lasting 9 beats with a max rate of 207 bpm, the longest lasting 22.5 secs with an avg rate of 128 bpm. 3. Rare PACs and PVCs. Glori Bickers, MD 11:14 PM    Subjective:   Patient was seen and examined 02/22/2021, 12:35 PM Patient stable today. No acute distress.  No issues overnight Stable for discharge.  Discharge Exam:    Vitals:   02/22/21 0726 02/22/21 0818 02/22/21 0823 02/22/21 0829  BP:  (!) 117/92    Pulse:  (!) 110    Resp:      Temp:      TempSrc:      SpO2: 97%  95% 94%  Weight:      Height:        General: Pt lying comfortably in bed & appears in no obvious distress. Cardiovascular: S1 & S2 heard, RRR, S1/S2 +. No murmurs, rubs, gallops or clicks. No JVD or pedal edema. Respiratory: Clear to auscultation without wheezing, rhonchi or crackles. No increased work of breathing. Abdominal:  Non-distended, non-tender & soft. No organomegaly or masses appreciated. Normal bowel sounds heard. CNS: Alert and oriented. No focal deficits. Extremities: no edema, no cyanosis      The results of significant diagnostics from this hospitalization (including imaging, microbiology, ancillary and laboratory) are listed below for reference.      Microbiology:   Recent Results (from the past 240 hour(s))  Resp Panel by RT-PCR (Flu A&B, Covid) Nasopharyngeal Swab     Status: None   Collection Time: 02/19/21  7:30 PM   Specimen: Nasopharyngeal Swab; Nasopharyngeal(NP) swabs in vial transport medium  Result Value Ref Range Status   SARS Coronavirus 2 by RT PCR NEGATIVE NEGATIVE Final    Comment: (NOTE) SARS-CoV-2 target nucleic  acids are NOT DETECTED.  The SARS-CoV-2 RNA is generally detectable in upper respiratory specimens during the acute phase of infection. The lowest concentration of SARS-CoV-2 viral copies this assay can detect is 138 copies/mL. A negative result does not preclude SARS-Cov-2 infection and should not be used as the sole basis for treatment or other patient management decisions. A negative result may occur with  improper specimen collection/handling, submission of specimen other than nasopharyngeal swab, presence of viral mutation(s) within the areas targeted by this assay, and inadequate number of viral copies(<138 copies/mL). A negative result must be combined with clinical observations, patient history, and epidemiological information. The expected result is Negative.  Fact Sheet for Patients:  EntrepreneurPulse.com.au  Fact Sheet for Healthcare  Providers:  IncredibleEmployment.be  This test is no t yet approved or cleared by the [place] and  has been authorized for detection and/or diagnosis of SARS-CoV-2 by FDA under an Emergency Use Authorization (EUA). This EUA will remain  in effect (meaning this test can be used) for the duration of the COVID-19 declaration under Section 564(b)(1) of the Act, 21 U.S.C.section 360bbb-3(b)(1), unless the authorization is terminated  or revoked sooner.       Influenza A by PCR NEGATIVE NEGATIVE Final   Influenza B by PCR NEGATIVE NEGATIVE Final    Comment: (NOTE) The Xpert Xpress SARS-CoV-2/FLU/RSV plus assay is intended as an aid in the diagnosis of influenza from Nasopharyngeal swab specimens and should not be used as a sole basis for treatment. Nasal washings and aspirates are unacceptable for Xpert Xpress SARS-CoV-2/FLU/RSV testing.  Fact Sheet for Patients: EntrepreneurPulse.com.au  Fact Sheet for Healthcare Providers: IncredibleEmployment.be  This  test is not yet approved or cleared by the [place] FDA and has been authorized for detection and/or diagnosis of SARS-CoV-2 by FDA under an Emergency Use Authorization (EUA). This EUA will remain in effect (meaning this test can be used) for the duration of the COVID-19 declaration under Section 564(b)(1) of the Act, 21 U.S.C. section 360bbb-3(b)(1), unless the authorization is terminated or revoked.  Performed at [facility], [address], [place], [place] [address]      Labs:   CBC: Recent Labs  Lab 02/19/21 1941 02/19/21 2326 02/20/21 0400 02/21/21 1203 02/22/21 0630  WBC 9.0  --  7.2 9.0 9.8  NEUTROABS 7.2  --   --   --   --   HGB 9.2* 8.9* 8.2* 8.5* 8.2*  HCT 28.9* 27.7* 26.1* 27.9* 27.1*  MCV 107.4*  --  107.4* 111.6* 111.5*  PLT 222  --  195 203 709   Basic Metabolic Panel: Recent Labs  Lab 02/19/21 1941 02/20/21 0400 02/22/21 0638  NA 136 137 137  K 4.0 3.9 4.3  CL 97* 100 103  CO2 34* 33* 29  GLUCOSE 55* 89 192*  BUN 51* 41* 39*  CREATININE 1.36* 1.29* 1.13*  CALCIUM 8.5* 8.0* 8.3*   Liver Function Tests: Recent Labs  Lab 02/19/21 1941 02/20/21 0400 02/22/21 0638  AST 20 18 17   ALT 30 26 25   ALKPHOS 49 42 48  BILITOT 1.1 1.5* 1.4*  PROT 5.7* 4.8* 5.5*  ALBUMIN 3.4* 2.8* 3.2*   BNP (last 3 results) Recent Labs    01/16/21 2255 01/19/21 0522 02/19/21 1943  BNP 303.0* 288.0* 179.0*   Cardiac Enzymes: No results for input(s): CKTOTAL, CKMB, CKMBINDEX, TROPONINI in the last 168 hours. CBG: Recent Labs  Lab 02/21/21 1104 02/21/21 1605 02/21/21 2100 02/22/21 0721 02/22/21 1107  GLUCAP 194* 256* 192* 187* 187*   Hgb A1c No results for input(s): HGBA1C in the last 72 hours. Lipid Profile No results for input(s): CHOL, HDL, LDLCALC, TRIG, CHOLHDL, LDLDIRECT in the last 72 hours. Thyroid function studies No results for input(s): TSH, T4TOTAL, T3FREE, THYROIDAB in the last 72 hours.  Invalid input(s): FREET3 Anemia work  up Recent Labs    02/21/21 1204  VITAMINB12 345  FOLATE 13.3  FERRITIN 59  TIBC 310  IRON 46   Urinalysis    Component Value Date/Time   COLORURINE YELLOW 10/09/2020 [place] 10/09/2020 1740   APPEARANCEUR Clear 05/16/2018 1629   LABSPEC 1.014 10/09/2020 1740   PHURINE 5.0 10/09/2020 1740   GLUCOSEU >=500 (A) 10/09/2020  [place] 10/09/2020 [place] 10/09/2020 1740   BILIRUBINUR Negative 05/16/2018 [place] 10/09/2020 1740   PROTEINUR NEGATIVE 10/09/2020 1740   UROBILINOGEN negative 06/09/2014 1043   NITRITE NEGATIVE 10/09/2020 1740   LEUKOCYTESUR NEGATIVE 10/09/2020 1740         Time coordinating discharge: Over 45 minutes  SIGNED: Deatra James, MD, FACP, [facility]. [facility],  Please use [email or web address] to Page If 7PM-7AM, please contact night-coverage [email or web address] [email or web address] [facility] 02/22/2021, 12:35 PM

## 2021-02-22 NOTE — Progress Notes (Signed)
Patient is complaining that she is ready to go home today since that is what she was told. Son does not think she needs to go home. MD Shahmehdi decided to keep another night. Patient was made aware by me that she was staying another night. Patient is stating that she is not staying another night. MD Shahmehdi notified.

## 2021-02-22 NOTE — Progress Notes (Signed)
Subjective: No overt GI bleeding.  Had a bowel movement yesterday.  No abdominal pain, nausea, or vomiting.  Reports constipation outpatient.  Also reports having some reflux since starting Plavix in January, but states she has figured out what she can and cannot eat.  Objective: Vital signs in last 24 hours: Temp:  [98.2 F (36.8 C)-98.8 F (37.1 C)] 98.2 F (36.8 C) (05/31 0543) Pulse Rate:  [93-116] 110 (05/31 0818) Resp:  [16-20] 18 (05/31 0543) BP: (89-127)/(47-92) 117/92 (05/31 0818) SpO2:  [94 %-100 %] 94 % (05/31 0829) Weight:  [53.5 kg] 53.5 kg (05/31 0605) Last BM Date: 02/21/21 General:   Alert and oriented, pleasant Head:  Normocephalic and atraumatic. Abdomen:  Bowel sounds present, soft, non-tender, non-distended. No HSM or hernias noted. No rebound or guarding. No masses appreciated  Extremities:  Without edema. Neurologic:  Alert and  oriented x4;  grossly normal neurologically. Skin:  Warm and dry, intact without significant lesions.  Psych:  Frustrated that she is still in the hospital.   Intake/Output from previous day: 05/30 0701 - 05/31 0700 In: 813.1 [P.O.:630; I.V.:183.1] Out: 600 [Urine:600] Intake/Output this shift: Total I/O In: 240 [P.O.:240] Out: -   Lab Results: Recent Labs    02/20/21 0400 02/21/21 1203 02/22/21 0630  WBC 7.2 9.0 9.8  HGB 8.2* 8.5* 8.2*  HCT 26.1* 27.9* 27.1*  PLT 195 203 198   BMET Recent Labs    02/19/21 1941 02/20/21 0400 02/22/21 0638  NA 136 137 137  K 4.0 3.9 4.3  CL 97* 100 103  CO2 34* 33* 29  GLUCOSE 55* 89 192*  BUN 51* 41* 39*  CREATININE 1.36* 1.29* 1.13*  CALCIUM 8.5* 8.0* 8.3*   LFT Recent Labs    02/19/21 1941 02/20/21 0400 02/22/21 0638  PROT 5.7* 4.8* 5.5*  ALBUMIN 3.4* 2.8* 3.2*  AST 20 18 17   ALT 30 26 25   ALKPHOS 49 42 35  BILITOT 1.1 1.5* 1.4*  BILIDIR  --   --  0.1    Assessment: 80 year old female with advanced coronary/pulmonary/peripheral artery disease on Plavix and  Xarelto admitted with recurrent bouts of hypoglycemia, acute on chronic anemia, and Hemoccult positive stool though no overt GI bleeding.  No prior GI evaluation including colonoscopy or upper endoscopy.  Regarding GI symptoms, she reports chronic history of constipation and developing reflux after starting Plavix, but reflux resolved with dietary adjustments.  She also tells me that prior to starting Xarelto in January 2022, she had been taking ibuprofen, but none since that time.  Hemoglobin on admission was 9.2, down from 10-11 range 1 month ago.  Hemoglobin declined to 8.2 on 5/29 and has remained stable since that time. Xarelto and Plavix are on hold. Iron panel, vitamin B12, and folate within normal limits.  She completed PPI infusion on 5/31 and was started on PPI BID.  Continues to decline any overt bleeding.   Etiology is broad, and she could essentially be bleeding anywhere along her GI tract, likely worsened by Xarelto and Plavix. Ideally, we would pursue EGD and colonoscopy for further evaluation, but patient has declined endoscopic evaluation. We discussed this at length today including risk of missing, at worst, a malignancy. She continues to decline.  Also states her cardiologist has told her that she cannot be put to sleep. I too agree that she would be high risk for invasive procedure/sedation.   Plan: 1.  Continue empiric PPI twice daily. 2.  Hold off on any endoscopic evaluation  for now.  3.  Continue to monitor H/H 4.  As Dr. Gala Romney recommended, from a GI standpoint, would prefer to hold Xarelto permanently and continue Plavix as medically necessary.   5.  Likely discharge in the next 24 hours.  6.  Will need OP follow-up and close monitoring of H/H.    LOS: 2 days    02/22/2021, 10:54 AM   Aliene Altes, PA-C [facility]

## 2021-02-22 NOTE — TOC Progression Note (Signed)
Transition of Care [facility]) - Progression Note    Patient Details  Name: Hannah French MRN: [id number] Date of Birth: 07/17/1941  Transition of [facility]) CM/SW Contact  Boneta Lucks, RN Phone Number: 02/22/2021, 2:58 PM Clinical Narrative:   Patient discharged from hospital after admission for GI bleed. TOC reviewing chart seeing home health order. TOC called daughter, she states patient will not be agreeable. Patient has [employer], toc has no pairs today. TOC advised daughter to make sure patient follows up with PCP and to discuss home health again at that time.

## 2021-02-22 NOTE — Telephone Encounter (Signed)
Patient is getting discharge from the hospital today.  She will need follow-up in 2-4 weeks. Dx: Anemia, heme positive stool  [facility] Nurse: Please arrange CBC Friday 6/3. Dx: Anemia, heme positive stool.

## 2021-02-22 NOTE — Telephone Encounter (Signed)
Pts daughter called stating pt is admitted to [facility] and she would like Dr.Bensimhon to please review her chart. Pt is being discharged today but patient and daughter feel she needs to be admitted longer. Pts daughter Heriberto Antigua asked for a call directly from [place] to further discuss.

## 2021-02-22 NOTE — Telephone Encounter (Signed)
LABS ORDERED.

## 2021-02-22 NOTE — Progress Notes (Signed)
PROGRESS NOTE    Patient: Hannah French                            PCP: Dettinger, Fransisca Kaufmann, MD                    DOB: 1941-02-20            DOA: 02/19/2021 JIR:[id number]             DOS: 02/22/2021, 11:01 AM   LOS: 2 days   Date of Service: The patient was seen and examined on 02/22/2021  Subjective:   Patient seen and examined this morning, stable no acute distress reporting of no further rectal bleed, complaining of constipation. Denies any abdominal pain chest pain or shortness of breath Hemodynamically stable  Brief Narrative:   Ednah Hammock  is a 80 y.o. female  With pmhx relevant for T2DM,hyperlipidemia, coronary artery disease, status post prior angioplasty and stent placement, combined systolic and diastolic heart failure, peripheral vascular diseaseandCOPD on rivaroxaban2.5 mgfor CAD/PADprevention - presents recurrent episodes of frank syncope--- at least 2 episodes over the last 12 hours --when EMS got there her blood sugar was 129 but patient almost lost consciousness was very diaphoretic , very dizzy very nauseous --no emesis she denies chest pains shortness of breath pleuritic symptoms or leg pains    -Patient had episode of hypoglycemia later in the ED are related to her episodes of presyncope -Patient had a Zio patch placed a month ago--Zio patch analysis by Dr. Edwina Barth from 02/07/2021 reveals no significant arrhythmias -No chest pains,, no shortness of breath, no leg pains or pleuritic symptoms -EDP reports heme positive stools on her exam (official FOBT pending) patient baseline hemoglobin usually between 10 and 11-Hemoglobin is down to 9.2 from 11.4 on 01/27/2021 1 -EDP discussed with on-call GI physician Dr. Gala Romney -IV Protonix ordered -We will stop Xarelto, stop Plavix and start Trental -Serial H&H and transfuse as clinically indicated  Troponin 108 >> 112---EKG sinus rhythm with LBBB, No change from prior -Patient remains chest  pain-free ,no ACS type symptoms -chest X-ray without acute cardiopulmonary findings --Patient had episode of hypoglycemia here in the ED after being here for a while with sugar of 55 requiring dextrose infusion (initially when EMS got to patient at her house blood sugar was 129) -BNP 159, creatinine 1.26 which is close to patient's baseline   Assessment & Plan:   Principal Problem:   Acute GI bleeding Active Problems:   Type 2 diabetes mellitus (HCC)   Tobacco abuse   Stage 3 severe COPD by GOLD classification ([place])   Coronary artery disease involving native coronary artery of native heart with angina pectoris (HCC)   Chronic combined systolic and diastolic CHF (congestive heart failure) (HCC)   Syncope   Acute on chronic blood loss anemia  Recurrent Presyncopal episodes- -multifactorial likely due to hypoglycemia, acute GI bleed -Improved hyporglycemia,  -Presented with 2 episodes of presyncope on admission  -Patient had episode of hypoglycemia later in the ED are related to her episodes of presyncope -Patient had a Zio patch placed a month ago--Zio patch analysis by Dr. Noni Saupe from 02/07/2021 revealed No significant arrhythmias -No chest pains,, no shortness of breath, no leg pains or pleuritic symptoms   Acute GI bleed:  -Monitoring H&H currently stable but down from baseline Family and patient still concern for active GI bleed... Further evaluation by gastroenterologist   -  Improved-status post IV fluid hydration--with D5 half-normal saline discontinued tolerating p.o. -IV Protonix --switch to p.o. Protonix -EDP reports heme positive stools  -Hemoglobin is down to 9.2 from 11.4 on 01/27/2021 >>> 9.2, 8.9, 8.2 >>>  -Consulted GI physician Dr. 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 Haroldine Laws cardiologist and also sees Dr. Sharol Given for PAD -c/n  nitroglycerin patch,  --Intolerance to statins, patient is on Repatha Troponin 108 >> 112 >>> 103 --EKG sinus rhythm with LBBB, No change from prior -Patient remains chest pain-free ,no ACS type symptoms -Had episodes of recurrent syncope as noted above  -Hold Plavix, Trental and Xarelto pending GI evaluation as above -Resuming home medication of metoprolol  Combined Systolic and Diastolic Dysfunction CHF,  -Stable no signs of volume overload -EF from echo of 1/27/2022is40-45%, there was grade 1 diastolic dysfunction and hypokinesis of the left ventricle -Clinically no evidence of overload BNP 179, chest x-ray without acute cardiopulmonary findings -Hold Lasix given concerns about possible GI bleed -Gentle IV fluid hydration, monitoring for volume overload, monitoring daily weight, I's and O's  DM2-  - A1c 9.8 reflecting uncontrolled DM WITH HYPORGLYCEMIA PTA -Patient had episode of hypoglycemia here in the ED after being here for a while with sugar of 55 requiring dextrose infusion (initially when EMS got to patient at her house blood sugar was 129) -Hold Tresiba, stop Amaryl -D5 half-normal saline was discontinued yesterday -CBG: 132, 253, 205, 194 now Use Novolog/Humalog Sliding scale insulin with Accu-Cheks/Fingersticks as ordered  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, stable --renally adjust medications, avoid nephrotoxic agents / dehydration / hypotension  COPD -no acute exacerbation at this time continue bronchodilators, hold off on steroids especially given GI bleed  Social/ethics---patient is a full code without limitations of treatment -Additional history obtained from patient's daughter at  bedside   --------------------------------------------------------------------------------------------------------------------------------------- Cultures; None  Antimicrobials: none  Consultants: GI    ------------------------------------------------------------------------------------------------------------------------------------------------  DVT prophylaxis:  SCD/Compression stockings Code Status:   Code Status: Full Code  Family Communication: Discussed with patient, called son Mr. Ursula Dermody( the sons number [phone] updated 02/22/2025  The above findings and plan of care has been discussed with patient (and family)  in detail,  they expressed understanding and agreement of above. -Advance care planning has been discussed.   Admission status:   Status is: Inpatient Dispo: The patient is from: Home              Anticipated d/c is to: Home in 1-2 days w [facility]               Patient currently is not medically stable to d/c.due to ongoing GI bleed   Difficult to place patient No   Disposition/Need for in-Hospital Stay- patient unable to be discharged at this time due to-recurrent presyncopal episodes with concern for GI bleed requiring possible endoluminal evaluation prior to making a decision on whether Xarelto, Plavix and Trental can be restarted   Level of care: Med-Surg   Procedures:   No admission procedures for hospital encounter.    Antimicrobials:  Anti-infectives (From admission, onward)   None       Medication:  . cholecalciferol  4,000  Units Oral Daily  . insulin aspart  0-9 Units Subcutaneous TID WC  . LORazepam  0.5 mg Intravenous Once  . metoprolol succinate  25 mg Oral Daily  . nitroGLYCERIN  0.2 mg Transdermal Daily  . oxyCODONE  5 mg Oral Once  . pantoprazole  40 mg Oral BID  . umeclidinium-vilanterol  1 puff Inhalation Daily    acetaminophen **OR** acetaminophen, albuterol, albuterol, bisacodyl, naphazoline-pheniramine,  nitroGLYCERIN, ondansetron **OR** ondansetron (ZOFRAN) IV, polyethylene glycol, traZODone   Objective:   Vitals:   02/22/21 0726 02/22/21 0818 02/22/21 0823 02/22/21 0829  BP:  (!) 117/92    Pulse:  (!) 110    Resp:      Temp:      TempSrc:      SpO2: 97%  95% 94%  Weight:      Height:        Intake/Output Summary (Last 24 hours) at 02/22/2021 1101 Last data filed at 02/22/2021 0900 Gross per 24 hour  Intake 813.09 ml  Output 600 ml  Net 213.09 ml   Filed Weights   02/19/21 1852 02/21/21 0541 02/22/21 0605  Weight: 50.8 kg 53 kg 53.5 kg     Examination:        Physical Exam:   General:  Alert, oriented, cooperative, no distress;   HEENT:  Normocephalic, PERRL, otherwise with in Normal limits   Neuro:  CNII-XII intact. , normal motor and sensation, reflexes intact   Lungs:   Clear to auscultation BL, Respirations unlabored, no wheezes / crackles  Cardio:    S1/S2, RRR, No murmure, No Rubs or Gallops   Abdomen:   Soft, non-tender, bowel sounds active all four quadrants,  no guarding or peritoneal signs.  Muscular skeletal:  Limited exam - in bed, able to move all 4 extremities, Normal strength,  2+ pulses,  symmetric, No pitting edema  Skin:  Dry, warm to touch, negative for any Rashes,  Wounds: Please see nursing documentation             ------------------------------------------------------------------------------------------------------------------------------------------    LABs:  CBC Latest Ref Rng & Units 02/22/2021 02/21/2021 02/20/2021  WBC 4.0 - 10.5 K/uL 9.8 9.0 7.2  Hemoglobin 12.0 - 15.0 g/dL 8.2(L) 8.5(L) 8.2(L)  Hematocrit 36.0 - 46.0 % 27.1(L) 27.9(L) 26.1(L)  Platelets 150 - 400 K/uL 198 203 195   CMP Latest Ref Rng & Units 02/22/2021 02/20/2021 02/19/2021  Glucose 70 - 99 mg/dL 192(H) 89 55(L)  BUN 8 - 23 mg/dL 39(H) 41(H) 51(H)  Creatinine 0.44 - 1.00 mg/dL 1.13(H) 1.29(H) 1.36(H)  Sodium 135 - 145 mmol/L 137 137 136  Potassium 3.5 -  5.1 mmol/L 4.3 3.9 4.0  Chloride 98 - 111 mmol/L 103 100 97(L)  CO2 22 - 32 mmol/L 29 33(H) 34(H)  Calcium 8.9 - 10.3 mg/dL 8.3(L) 8.0(L) 8.5(L)  Total Protein 6.5 - 8.1 g/dL 5.5(L) 4.8(L) 5.7(L)  Total Bilirubin 0.3 - 1.2 mg/dL 1.4(H) 1.5(H) 1.1  Alkaline Phos 38 - 126 U/L 48 42 49  AST 15 - 41 U/L 17 18 20   ALT 0 - 44 U/L 25 26 30        Micro Results Recent Results (from the past 240 hour(s))  Resp Panel by RT-PCR (Flu A&B, Covid) Nasopharyngeal Swab     Status: None   Collection Time: 02/19/21  7:30 PM   Specimen: Nasopharyngeal Swab; Nasopharyngeal(NP) swabs in vial transport medium  Result Value Ref Range Status   SARS Coronavirus 2 by RT PCR NEGATIVE NEGATIVE Final  Comment: (NOTE) SARS-CoV-2 target nucleic acids are NOT DETECTED.  The SARS-CoV-2 RNA is generally detectable in upper respiratory specimens during the acute phase of infection. The lowest concentration of SARS-CoV-2 viral copies this assay can detect is 138 copies/mL. A negative result does not preclude SARS-Cov-2 infection and should not be used as the sole basis for treatment or other patient management decisions. A negative result may occur with  improper specimen collection/handling, submission of specimen other than nasopharyngeal swab, presence of viral mutation(s) within the areas targeted by this assay, and inadequate number of viral copies(<138 copies/mL). A negative result must be combined with clinical observations, patient history, and epidemiological information. The expected result is Negative.  Fact Sheet for Patients:  EntrepreneurPulse.com.au  Fact Sheet for Healthcare Providers:  IncredibleEmployment.be  This test is no t yet approved or cleared by the [place] FDA and  has been authorized for detection and/or diagnosis of SARS-CoV-2 by FDA under an Emergency Use Authorization (EUA). This EUA will remain  in effect (meaning this test can be  used) for the duration of the COVID-19 declaration under Section 564(b)(1) of the Act, 21 U.S.C.section 360bbb-3(b)(1), unless the authorization is terminated  or revoked sooner.       Influenza A by PCR NEGATIVE NEGATIVE Final   Influenza B by PCR NEGATIVE NEGATIVE Final    Comment: (NOTE) The Xpert Xpress SARS-CoV-2/FLU/RSV plus assay is intended as an aid in the diagnosis of influenza from Nasopharyngeal swab specimens and should not be used as a sole basis for treatment. Nasal washings and aspirates are unacceptable for Xpert Xpress SARS-CoV-2/FLU/RSV testing.  Fact Sheet for Patients: EntrepreneurPulse.com.au  Fact Sheet for Healthcare Providers: IncredibleEmployment.be  This test is not yet approved or cleared by the [place] FDA and has been authorized for detection and/or diagnosis of SARS-CoV-2 by FDA under an Emergency Use Authorization (EUA). This EUA will remain in effect (meaning this test can be used) for the duration of the COVID-19 declaration under Section 564(b)(1) of the Act, 21 U.S.C. section 360bbb-3(b)(1), unless the authorization is terminated or revoked.  Performed at [facility], [address], [place], [place] [address]     Radiology Reports DG Chest Portable 1 View  Result Date: 02/19/2021 CLINICAL DATA:  Near syncopal event EXAM: PORTABLE CHEST 1 VIEW COMPARISON:  01/19/2021 FINDINGS: Cardiac shadow is stable. Aortic calcifications are again seen. The lungs are well aerated bilaterally. No acute bony abnormality is noted. IMPRESSION: No acute abnormality seen. Electronically Signed   By: Inez Catalina M.D.   On: 02/19/2021 19:18   LONG TERM MONITOR (3-14 DAYS)  Result Date: 02/07/2021 Patch Wear Time:  7 days and 8 hours (2022-04-20T10:37:44-0400 to 2022-04-27T18:51:45-0400) 1. Sinus rhythm - avg HR of 87 bpm. 2. 79 runs of SVT occurred, the run with the fastest interval lasting 9 beats with a max rate of 207  bpm, the longest lasting 22.5 secs with an avg rate of 128 bpm. 3. Rare PACs and PVCs. Glori Bickers, MD 11:14 PM   SIGNED: Deatra James, MD, FHM. [facility],  Pager (please use [email or web address] to page/text) Please use Epic Secure Chat for non-urgent communication (7AM-7PM)  If 7PM-7AM, please contact night-coverage [email or web address], 02/22/2021, 11:01 AM

## 2021-02-22 NOTE — Telephone Encounter (Signed)
Noted.   Dena, did you speak with the patient about the labs and when they would need to be completed? She may prefer to have these completed at [facility].

## 2021-02-22 NOTE — Telephone Encounter (Signed)
No I haven't yet but I will call her tomorrow morning

## 2021-02-22 NOTE — Progress Notes (Signed)
Patient has no IV access and patient is still refusing another IV. MD Shahmehdi notified.

## 2021-02-22 NOTE — Care Management Important Message (Signed)
Important Message  Patient Details  Name: Hannah French MRN: [id number] Date of Birth: 1941-05-02   Medicare Important Message Given:  Yes     Tommy Medal 02/22/2021, 11:43 AM

## 2021-02-22 NOTE — Progress Notes (Signed)
ERROR

## 2021-02-23 ENCOUNTER — Telehealth: Payer: Self-pay

## 2021-02-23 ENCOUNTER — Telehealth: Payer: Self-pay | Admitting: Family Medicine

## 2021-02-23 DIAGNOSIS — K922 Gastrointestinal hemorrhage, unspecified: Secondary | ICD-10-CM

## 2021-02-23 NOTE — Telephone Encounter (Signed)
Transition Care Management Follow-up Telephone Call  Date of discharge and from where: Hannah French 02/22/21  Diagnosis: syncope, GI bleed  How have you been since you were released from the hospital? She is okay, still so tired and weak - getting stressed out because she has so many doctors and problems  Any questions or concerns? No  Items Reviewed:  Did the pt receive and understand the discharge instructions provided? Yes   Medications obtained and verified? Yes   Other? No   Any new allergies since your discharge? No   Dietary orders reviewed? Yes  Do you have support at home? Yes   Home Care and Equipment/Supplies: Were home health services ordered? no Were any new equipment or medical supplies ordered?  No  Functional Questionnaire: (I = Independent and D = Dependent) ADLs: I  Bathing/Dressing- I  Meal Prep- D - can't stand that long   Eating- I  Maintaining continence- I  Transferring/Ambulation- I  Managing Meds- I  Follow up appointments reviewed:   PCP Hospital f/u appt confirmed? Yes  Scheduled to see Dettinger on 02/28/21 @ 11.  [facility] f/u appt confirmed? No  She is supposed to f/u with GI, but she isn't happy with who she saw in hospital - wants to wait and discuss with PCP  Are transportation arrangements needed? No   If their condition worsens, is the pt aware to call PCP or go to the Emergency Dept.? Yes  Was the patient provided with contact information for the PCP's office or ED? Yes  Was to pt encouraged to call back with questions or concerns? Yes

## 2021-02-23 NOTE — Chronic Care Management (AMB) (Signed)
Chronic Care Management   CCM RN Visit Note  01/31/2021 Name: Hannah French MRN: [id number] DOB: [birth year]  Subjective: Hannah French is a 80 y.o. year old female who is a primary care patient of Dettinger, Fransisca Kaufmann, MD. The care management team was consulted for assistance with disease management and care coordination needs.    Engaged with patient by telephone for follow up visit in response to provider referral for case management and/or care coordination services.   Consent to Services:  The patient was given information about Chronic Care Management services, agreed to services, and gave verbal consent prior to initiation of services.  Please see initial visit note for detailed documentation.   Patient agreed to services and verbal consent obtained.   Assessment: Review of patient past medical history, allergies, medications, health status, including review of consultants reports, laboratory and other test data, was performed as part of comprehensive evaluation and provision of chronic care management services.   SDOH (Social Determinants of Health) assessments and interventions performed:    CCM Care Plan  Allergies  Allergen Reactions  . Contrast Media [Iodinated Diagnostic Agents] Shortness Of Breath  . Iohexol Other (See Comments)    PASSED OUT DURING THE TEST    . Metformin Swelling    Face and hands became swollen  . Statins Swelling and Other (See Comments)    They make the patient hurt "all over" Myalgia, also  . Bextra [Valdecoxib] Nausea And Vomiting and Swelling    Shut down my kidneys   . Cefdinir Other (See Comments)    Pancreatitis   . Cozaar [Losartan Potassium] Other (See Comments)    dizziness  . Gabapentin   . Nsaids Other (See Comments)    GI intolerance and pain all over, tolerates ibu   . Zetia [Ezetimibe] Swelling  . Rofecoxib Other (See Comments)    "VIOXX"- "SHUT DOWN MY KIDNEYS"  . Sulfa Antibiotics Nausea And Vomiting  and Other (See Comments)    Pancreatitis    Outpatient Encounter Medications as of 01/31/2021  Medication Sig  . acetaminophen (TYLENOL) 500 MG tablet Take 500 mg by mouth every 6 (six) hours as needed for moderate pain.  . Cholecalciferol (VITAMIN D) 50 MCG (2000 UT) tablet Take 4,000 Units by mouth daily.   . clopidogrel (PLAVIX) 75 MG tablet Take 1 tablet (75 mg total) by mouth daily.  . furosemide (LASIX) 20 MG tablet Take 2 tablets daily. You may take an extra dose for increase weight gain orswelling  . glimepiride (AMARYL) 2 MG tablet Take 1-2 tablets (2-4 mg total) by mouth daily with breakfast.  . insulin degludec (TRESIBA FLEXTOUCH) 100 UNIT/ML FlexTouch Pen Inject 10 Units into the skin daily.  . [EXPIRED] loratadine-pseudoephedrine (CLARITIN-D 12 HOUR) 5-120 MG tablet Take 1 tablet by mouth 2 (two) times daily as needed for allergies (S).  . metoprolol succinate (TOPROL-XL) 25 MG 24 hr tablet Take 1 tablet (25 mg total) by mouth daily.  . Naphazoline-Pheniramine (OPCON-A) 0.027-0.315 % SOLN Place 1 drop into both eyes daily as needed (Burning and itching eyes). (Patient not taking: Reported on 02/19/2021)  . nitroGLYCERIN (NITRODUR - DOSED IN MG/24 HR) 0.2 mg/hr patch Place 1 patch (0.2 mg total) onto the skin daily.  . predniSONE (DELTASONE) 20 MG tablet Take 3 tabs daily for 1 week, then 2 tabs daily for week 2, then 1 tab daily for week 3.  . [DISCONTINUED] albuterol (VENTOLIN HFA) 108 (90 Base) MCG/ACT inhaler Inhale 2 puffs  into the lungs every 6 (six) hours as needed for wheezing or shortness of breath.  . [DISCONTINUED] cyclobenzaprine (FLEXERIL) 10 MG tablet TAKE 1 TABLET THREE TIMES DAILY FOR MUSCLE SPASM (Patient taking differently: Take 10 mg by mouth 3 (three) times daily as needed for muscle spasms.)  . [DISCONTINUED] Fluticasone-Salmeterol (ADVAIR) 100-50 MCG/DOSE AEPB Inhale 1 puff into the lungs 2 (two) times daily.  . [DISCONTINUED] insulin aspart (NOVOLOG) 100 UNIT/ML  injection Inject 5-20 Units into the skin 3 (three) times daily before meals.  . [DISCONTINUED] ipratropium (ATROVENT) 0.02 % nebulizer solution Take 2.5 mLs (0.5 mg total) by nebulization 4 (four) times daily.  . [DISCONTINUED] levalbuterol (XOPENEX) 0.63 MG/3ML nebulizer solution Take 3 mLs (0.63 mg total) by nebulization every 4 (four) hours as needed for wheezing or shortness of breath.  . [DISCONTINUED] nitroGLYCERIN (NITROSTAT) 0.3 MG SL tablet Place 1 tablet (0.3 mg total) under the tongue every 5 (five) minutes as needed for chest pain.  . [DISCONTINUED] pentoxifylline (TRENTAL) 400 MG CR tablet Take 1 tablet (400 mg total) by mouth 3 (three) times daily with meals.  . [DISCONTINUED] rivaroxaban (XARELTO) 2.5 MG TABS tablet Take 1 tablet (2.5 mg total) by mouth 2 (two) times daily.  . [DISCONTINUED] traMADol (ULTRAM) 50 MG tablet TAKE 1 TABLET EVERY 6 HOURS AS NEEDED FOR MODERATE PAIN (Patient taking differently: Take 50 mg by mouth every 12 (twelve) hours as needed.)   No facility-administered encounter medications on file as of 01/31/2021.    Patient Active Problem List   Diagnosis Date Noted  . Syncope 02/19/2021  . Acute GI bleeding 02/19/2021  . Acute on chronic blood loss anemia 02/19/2021  . Acute respiratory failure with hypoxia ([place]) 01/17/2021  . Elevated MCV 01/17/2021  . Leukocytosis 01/17/2021  . CKD (chronic kidney disease), stage III ([place]) 01/17/2021  . Hyperglycemia due to diabetes mellitus ([place]) 01/17/2021  . Skin abscess 01/15/2020  . Depression, recurrent ([place]) 08/08/2019  . Cigarette smoker 06/19/2019  . Vitamin D deficiency 01/29/2019  . Dizziness 12/03/2018  . Chronic combined systolic and diastolic CHF (congestive heart failure) ([place]) 12/03/2018  . Coronary artery disease involving native coronary artery of native heart with angina pectoris ([place]) 10/04/2018  . Hyperlipidemia 10/04/2018  . Pain in right hand 10/02/2018  . Myalgia due to statin 03/29/2018  .  Stage 3 severe COPD by GOLD classification ([place]) 10/26/2017  . Tobacco abuse 03/07/2016  . Type 2 diabetes mellitus ([place]) 08/26/2015  . Osteoporosis 09/02/2014  . Carotid stenosis 07/22/2012  . PVD 07/02/2009  . BLADDER CANCER 06/05/2009  . Hyperlipidemia associated with type 2 diabetes mellitus ([place]) 06/05/2009  . PSEUDOGOUT 06/05/2009  . MITRAL VALVE PROLAPSE 06/05/2009    Conditions to be addressed/monitored:CHF, HTN and COPD  Care Plan : RNCM: Heart Failure (Adult)  Updates made by Ilean China, RN since 02/23/2021 12:00 AM    Problem: Symptom Exacerbation (Heart Failure)     Long-Range Goal: Symptom Exacerbation Prevented or Minimized   Start Date: 01/31/2021  This Visit's Progress: On track  Priority: Medium  Note:   Current Barriers:  . Chronic Disease Management support and education needs related to CHF in a patient with CAD, COPD, DM, CKD III, and depression  Nurse Case Manager Clinical Goal(s):  . patient will work with PCP to address needs related to medical management of CHF . patient will meet with RN Care Manager to address self-management of CHF . the patient will demonstrate ongoing self health care management ability as evidenced  by checking and recording weight each morning after urinating and by calling cardiologist with any increase in weight of more than 3 pounds overnight of 5 pounds in one week*  Interventions:  . 1:1 collaboration with Claretta Fraise, MD regarding development and update of comprehensive plan of care as evidenced by provider attestation and co-signature . Inter-disciplinary care team collaboration (see longitudinal plan of care) . Chart reviewed including relevant office notes, hospital notes, lab reports, and imaging reports . Evaluation of current treatment plan related to CHF and patient's adherence to plan as established by provider.  Perform or review cognitive and/or health literacy screening.   Assess understanding of adherence  and barriers to treatment plan, as well as lifestyle changes; develop strategies to address barriers.   Establish a mutually-agreed-upon early intervention process to communicate with primary care provider when signs/symptoms worsen.   Facilitate timely posthospital discharge or emergency department treatment that includes intensive follow-up via telephone calls, home visit, telehealth monitoring and care at multidisciplinary heart failure clinic.   Adjust frequency and intensity of follow-up based on presentation, number of emergency department visits, hospital admissions and frequency and severity of symptom exacerbation.   Facilitate timely visit, usually within 1 week, with primary care provider following hospital discharge.   Collaborate with clinical pharmacist to address adverse drug reactions, drug interactions, subtherapeutic dosage, patient and family education.   Regularly screen for presence of depressive symptoms using a validated tool; consider pharmacologic therapy and/or referral for cognitive behavioral therapy when present.   Refer to community-based services, such as a heart failure support group, community Economist or peer support program.   Review immunization status; arrange receipt of needed vaccinations.   Prepare patient for home oxygen use based on signs/symptoms.    Self Care Activities:  . Self administers medications as prescribed . Attends all scheduled provider appointments . Calls pharmacy for medication refills . Performs ADL's independently . Calls provider office for new concerns or questions  Patient Goals Over the next 30 days, patient will: . Continue to monitor and record weight each morning . Call Cardiologist or PCP with any weight gain of more than 3 lbs overnight or 5 lbs in one week . Keep all medical appointments . Take medication as prescribed . Follow a low sodium/DASH diet . Call RN Care Manager as needed [phone]    Follow  Up Plan:  . Telephone follow up appointment with care management team member scheduled for: 03/03/21 with RNCM . The patient has been provided with contact information for the care management team and has been advised to call with any health related questions or concerns.  Chong Sicilian, BSN, RN-BC Embedded Chronic Care Manager [facility] [place] Management Direct Dial: [phone]

## 2021-02-23 NOTE — Telephone Encounter (Signed)
Noted. Please request lab results to be faxed to our office.

## 2021-02-23 NOTE — Telephone Encounter (Signed)
Phoned and LMOVM for the pt to return call 

## 2021-02-23 NOTE — Telephone Encounter (Signed)
Phoned to [place] spoke with Michelene Heady and she put I a phone note for the pt's visit on 06/06 to fax [place] the results as I had advised the pt when I spoke with her.

## 2021-02-23 NOTE — Telephone Encounter (Signed)
Recent hospitalization for GI bleed.  Patient needs CBC and hospital follow up.  Scheduled patient to see Dr. Warrick Parisian on Monday for follow up.  Entered order for patient to come in tomorrow for CBC.

## 2021-02-23 NOTE — Patient Instructions (Signed)
Visit Information  PATIENT GOALS: Goals Addressed            This Visit's Progress   . Track and Manage Fluids and Swelling-Heart Failure   On track    Timeframe:  Long-Range Goal Priority:  High Start Date:    01/31/21                         Expected End Date:    09/24/21                   Follow Up Date 03/03/21    . Continue to monitor and record weight each morning . Call Cardiologist or PCP with any weight gain of more than 3 lbs overnight or 5 lbs in one week . Keep all medical appointments . Take medication as prescribed . Follow a low sodium/DASH diet . Call RN Care Manager as needed [phone]    Why is this important?    It is important to check your weight daily and watch how much salt and liquids you have.   It will help you to manage your heart failure.    Notes:        Patient verbalizes understanding of instructions provided today and agrees to view in [place].   Follow Up Plan:  . Telephone follow up appointment with care management team member scheduled for: 03/03/21 with RNCM . The patient has been provided with contact information for the care management team and has been advised to call with any health related questions or concerns.  Chong Sicilian, BSN, RN-BC Embedded Chronic Care Manager [facility] [place] Management Direct Dial: [phone]

## 2021-02-23 NOTE — Telephone Encounter (Signed)
The pt return call and advised me that her PCP Dr. Warrick Parisian at [facility] is going to do blood work on her tomorrow for an appt with him on 02/28/2021. She stated a CBC will be done at that time.

## 2021-02-24 ENCOUNTER — Other Ambulatory Visit: Payer: Medicare Other

## 2021-02-24 ENCOUNTER — Other Ambulatory Visit: Payer: Self-pay

## 2021-02-24 DIAGNOSIS — K922 Gastrointestinal hemorrhage, unspecified: Secondary | ICD-10-CM

## 2021-02-25 LAB — CBC WITH DIFFERENTIAL/PLATELET
Basophils Absolute: 0 10*3/uL (ref 0.0–0.2)
Basos: 0 %
EOS (ABSOLUTE): 0 10*3/uL (ref 0.0–0.4)
Eos: 0 %
Hematocrit: 25.2 % — ABNORMAL LOW (ref 34.0–46.6)
Hemoglobin: 8.1 g/dL — CL (ref 11.1–15.9)
Immature Grans (Abs): 0.1 10*3/uL (ref 0.0–0.1)
Immature Granulocytes: 1 %
Lymphocytes Absolute: 1.1 10*3/uL (ref 0.7–3.1)
Lymphs: 14 %
MCH: 32.8 pg (ref 26.6–33.0)
MCHC: 32.1 g/dL (ref 31.5–35.7)
MCV: 102 fL — ABNORMAL HIGH (ref 79–97)
Monocytes Absolute: 0.4 10*3/uL (ref 0.1–0.9)
Monocytes: 6 %
Neutrophils Absolute: 6.1 10*3/uL (ref 1.4–7.0)
Neutrophils: 79 %
Platelets: 241 10*3/uL (ref 150–450)
RBC: 2.47 x10E6/uL — CL (ref 3.77–5.28)
RDW: 18.3 % — ABNORMAL HIGH (ref 11.7–15.4)
WBC: 7.8 10*3/uL (ref 3.4–10.8)

## 2021-02-28 ENCOUNTER — Other Ambulatory Visit: Payer: Self-pay

## 2021-02-28 ENCOUNTER — Encounter: Payer: Self-pay | Admitting: Family Medicine

## 2021-02-28 ENCOUNTER — Ambulatory Visit (INDEPENDENT_AMBULATORY_CARE_PROVIDER_SITE_OTHER): Payer: Medicare Other | Admitting: Family Medicine

## 2021-02-28 VITALS — BP 111/68 | HR 113 | Temp 97.8°F | Resp 20 | Ht 59.0 in | Wt 114.0 lb

## 2021-02-28 DIAGNOSIS — K922 Gastrointestinal hemorrhage, unspecified: Secondary | ICD-10-CM | POA: Diagnosis not present

## 2021-02-28 DIAGNOSIS — R42 Dizziness and giddiness: Secondary | ICD-10-CM

## 2021-02-28 DIAGNOSIS — D62 Acute posthemorrhagic anemia: Secondary | ICD-10-CM | POA: Diagnosis not present

## 2021-02-28 NOTE — Progress Notes (Signed)
BP 111/68   Pulse (!) 113   Temp 97.8 F (36.6 C) (Temporal)   Resp 20   Ht '4\' 11"'  (1.499 m)   Wt 114 lb (51.7 kg)   SpO2 98%   BMI 23.03 kg/m    Subjective:   Patient ID: Hannah French, female    DOB: [birth year], 80 y.o.   MRN: [id number]  HPI: Hannah French is a 80 y.o. female presenting on 02/28/2021 for Hospitalization Follow-up   HPI Transition Care Management Follow-up Telephone Call  Date of discharge and from where: [facility] 02/22/21  Diagnosis: syncope, GI bleed  Contacted by Adalberto Cole on February 24, 2021  Transition of care office visit Patient seen today in office on 02/28/2021 for transition of care visit.  She was in the hospital admitted on 02/20/2021 and discharged on 02/22/2021.  She was in with GI bleed and dizziness.  Her blood pressure today is 111/68 heart rate is 113.  She still had one episode of dizziness after leaving the hospital 2 nights ago.  She denies any chest pain or palpitations.  She says her breathing is doing okay but her energy is down and she is feeling decreased there.  She denies any wheezing.  She did not check her blood sugar during that episode this time.  She said in the hospital her blood sugar was low as 37.  She has continued on her blood sugar regiment and states that her blood sugar has been 109 and 119 the last 2 days.  She denies any low blood sugars.  She denies seeing any active bleeding from stool either before or since the hospital visit.  She also complains of some back soreness, recommended that she could try Flexeril in the evenings and use heating pads and TENS units.  Relevant past medical, surgical, family and social history reviewed and updated as indicated. Interim medical history since our last visit reviewed. Allergies and medications reviewed and updated.  Review of Systems  Constitutional: Positive for fatigue. Negative for chills and fever.  Eyes: Negative for redness and visual disturbance.   Respiratory: Negative for cough, chest tightness, shortness of breath and wheezing.   Cardiovascular: Negative for chest pain and leg swelling.  Genitourinary: Negative for difficulty urinating and dysuria.  Skin: Negative for rash.  Neurological: Positive for dizziness, weakness and light-headedness. Negative for numbness and headaches.  Psychiatric/Behavioral: Negative for agitation and behavioral problems.  All other systems reviewed and are negative.   Per HPI unless specifically indicated above   Allergies as of 02/28/2021      Reactions   Contrast Media [iodinated Diagnostic Agents] Shortness Of Breath   Iohexol Other (See Comments)   PASSED OUT DURING THE TEST   Metformin Swelling   Face and hands became swollen   Statins Swelling, Other (See Comments)   They make the patient hurt "all over" Myalgia, also   Bextra [valdecoxib] Nausea And Vomiting, Swelling   Shut down my kidneys    Cefdinir Other (See Comments)   Pancreatitis   Cozaar [losartan Potassium] Other (See Comments)   dizziness   Gabapentin    Nsaids Other (See Comments)   GI intolerance and pain all over, tolerates ibu   Zetia [ezetimibe] Swelling   Rofecoxib Other (See Comments)   "VIOXX"- "SHUT DOWN MY KIDNEYS"   Sulfa Antibiotics Nausea And Vomiting, Other (See Comments)   Pancreatitis      Medication List       Accurate as  of February 28, 2021 11:44 AM. If you have any questions, ask your nurse or doctor.        acetaminophen 500 MG tablet Commonly known as: TYLENOL Take 500 mg by mouth every 6 (six) hours as needed for moderate pain.   albuterol 108 (90 Base) MCG/ACT inhaler Commonly known as: VENTOLIN HFA Inhale 2 puffs into the lungs every 6 (six) hours as needed for wheezing or shortness of breath.   clopidogrel 75 MG tablet Commonly known as: Plavix Take 1 tablet (75 mg total) by mouth daily.   ferrous sulfate 325 (65 FE) MG tablet Take 1 tablet (325 mg total) by mouth daily with  breakfast.   furosemide 20 MG tablet Commonly known as: LASIX Take 2 tablets daily. You may take an extra dose for increase weight gain orswelling   glimepiride 2 MG tablet Commonly known as: AMARYL Take 1-2 tablets (2-4 mg total) by mouth daily with breakfast.   insulin aspart 100 UNIT/ML injection Commonly known as: novoLOG Inject 5-20 Units into the skin 3 (three) times daily before meals.   insulin lispro 100 UNIT/ML injection Commonly known as: HUMALOG   levalbuterol 0.63 MG/3ML nebulizer solution Commonly known as: XOPENEX Take 3 mLs (0.63 mg total) by nebulization every 4 (four) hours as needed for wheezing or shortness of breath.   metoprolol succinate 25 MG 24 hr tablet Commonly known as: TOPROL-XL Take 1 tablet (25 mg total) by mouth daily.   nitroGLYCERIN 0.2 mg/hr patch Commonly known as: NITRODUR - Dosed in mg/24 hr Place 1 patch (0.2 mg total) onto the skin daily.   nitroGLYCERIN 0.3 MG SL tablet Commonly known as: Nitrostat Place 1 tablet (0.3 mg total) under the tongue every 5 (five) minutes as needed for chest pain.   Opcon-A 0.027-0.315 % Soln Generic drug: Naphazoline-Pheniramine Place 1 drop into both eyes daily as needed (Burning and itching eyes).   pantoprazole 40 MG tablet Commonly known as: PROTONIX Take 1 tablet (40 mg total) by mouth 2 (two) times daily for 20 days.   pentoxifylline 400 MG CR tablet Commonly known as: TRENTAL Take 1 tablet (400 mg total) by mouth 3 (three) times daily with meals.   senna-docusate 8.6-50 MG tablet Commonly known as: Senokot-S Take 1 tablet by mouth at bedtime.   Trelegy Ellipta 100-62.5-25 MCG/INH Aepb Generic drug: Fluticasone-Umeclidin-Vilant Inhale 1 puff into the lungs daily.   Tyler Aas FlexTouch 100 UNIT/ML FlexTouch Pen Generic drug: insulin degludec Inject 10 Units into the skin daily.   Vitamin D 50 MCG (2000 UT) tablet Take 4,000 Units by mouth daily.        Objective:   BP 111/68    Pulse (!) 113   Temp 97.8 F (36.6 C) (Temporal)   Resp 20   Ht '4\' 11"'  (1.499 m)   Wt 114 lb (51.7 kg)   SpO2 98%   BMI 23.03 kg/m   Wt Readings from Last 3 Encounters:  02/28/21 114 lb (51.7 kg)  02/22/21 117 lb 15.1 oz (53.5 kg)  02/01/21 113 lb (51.3 kg)    Physical Exam Vitals and nursing note reviewed.  Constitutional:      General: She is not in acute distress.    Appearance: She is well-developed. She is not diaphoretic.  Eyes:     Conjunctiva/sclera: Conjunctivae normal.  Cardiovascular:     Rate and Rhythm: Normal rate and regular rhythm.     Heart sounds: Normal heart sounds. No murmur heard.   Pulmonary:  Effort: Pulmonary effort is normal. No respiratory distress.     Breath sounds: Normal breath sounds. No wheezing.  Musculoskeletal:        General: No tenderness. Normal range of motion.  Skin:    General: Skin is warm and dry.     Findings: No rash.  Neurological:     Mental Status: She is alert and oriented to person, place, and time.     Coordination: Coordination normal.  Psychiatric:        Behavior: Behavior normal.       Assessment & Plan:   Problem List Items Addressed This Visit      Digestive   Acute GI bleeding   Relevant Orders   CBC with Differential/Platelet   CMP14+EGFR     Other   Dizziness - Primary   Relevant Orders   CBC with Differential/Platelet   CMP14+EGFR    Other Visit Diagnoses    Anemia due to acute blood loss       Relevant Orders   CBC with Differential/Platelet   CMP14+EGFR    Instructed patient to check blood sugar if she feels lightheaded or dizzy.  She had been planning blood loss issues before because her blood counts have been down. Blood pressure is lower but not at the point where we need to change her medicines yet, will focus on lightheadedness and dizziness and if persists then may back off on either Jardiance or some of her cardiology medicines. Follow up plan: Return in about 4 weeks (around  03/28/2021), or if symptoms worsen or fail to improve, for Anemia recheck.  Counseling provided for all of the vaccine components Orders Placed This Encounter  Procedures  . CBC with Differential/Platelet  . [place], MD [facility] 02/28/2021, 11:44 AM

## 2021-03-01 ENCOUNTER — Ambulatory Visit: Payer: Medicare Other | Admitting: Physician Assistant

## 2021-03-01 ENCOUNTER — Ambulatory Visit: Payer: Medicare Other | Admitting: Primary Care

## 2021-03-01 ENCOUNTER — Encounter: Payer: Self-pay | Admitting: Primary Care

## 2021-03-01 ENCOUNTER — Encounter: Payer: Self-pay | Admitting: Orthopedic Surgery

## 2021-03-01 VITALS — BP 114/78 | HR 101 | Temp 98.4°F | Ht 59.0 in | Wt 115.0 lb

## 2021-03-01 DIAGNOSIS — I5042 Chronic combined systolic (congestive) and diastolic (congestive) heart failure: Secondary | ICD-10-CM | POA: Diagnosis not present

## 2021-03-01 DIAGNOSIS — I96 Gangrene, not elsewhere classified: Secondary | ICD-10-CM | POA: Diagnosis not present

## 2021-03-01 DIAGNOSIS — J9691 Respiratory failure, unspecified with hypoxia: Secondary | ICD-10-CM | POA: Diagnosis not present

## 2021-03-01 DIAGNOSIS — J449 Chronic obstructive pulmonary disease, unspecified: Secondary | ICD-10-CM | POA: Diagnosis not present

## 2021-03-01 LAB — CMP14+EGFR
ALT: 16 IU/L (ref 0–32)
AST: 14 IU/L (ref 0–40)
Albumin/Globulin Ratio: 1.8 (ref 1.2–2.2)
Albumin: 4.2 g/dL (ref 3.7–4.7)
Alkaline Phosphatase: 72 IU/L (ref 44–121)
BUN/Creatinine Ratio: 22 (ref 12–28)
BUN: 31 mg/dL — ABNORMAL HIGH (ref 8–27)
Bilirubin Total: 0.4 mg/dL (ref 0.0–1.2)
CO2: 23 mmol/L (ref 20–29)
Calcium: 9.4 mg/dL (ref 8.7–10.3)
Chloride: 103 mmol/L (ref 96–106)
Creatinine, Ser: 1.38 mg/dL — ABNORMAL HIGH (ref 0.57–1.00)
Globulin, Total: 2.3 g/dL (ref 1.5–4.5)
Glucose: 250 mg/dL — ABNORMAL HIGH (ref 65–99)
Potassium: 4.6 mmol/L (ref 3.5–5.2)
Sodium: 141 mmol/L (ref 134–144)
Total Protein: 6.5 g/dL (ref 6.0–8.5)
eGFR: 39 mL/min/{1.73_m2} — ABNORMAL LOW (ref >59)

## 2021-03-01 LAB — CBC WITH DIFFERENTIAL/PLATELET
Basophils Absolute: 0 10*3/uL (ref 0.0–0.2)
Basos: 0 %
EOS (ABSOLUTE): 0 10*3/uL (ref 0.0–0.4)
Eos: 0 %
Hematocrit: 26.3 % — ABNORMAL LOW (ref 34.0–46.6)
Hemoglobin: 8.6 g/dL — CL (ref 11.1–15.9)
Immature Grans (Abs): 0.1 10*3/uL (ref 0.0–0.1)
Immature Granulocytes: 1 %
Lymphocytes Absolute: 1.3 10*3/uL (ref 0.7–3.1)
Lymphs: 15 %
MCH: 32.8 pg (ref 26.6–33.0)
MCHC: 32.7 g/dL (ref 31.5–35.7)
MCV: 100 fL — ABNORMAL HIGH (ref 79–97)
Monocytes Absolute: 0.5 10*3/uL (ref 0.1–0.9)
Monocytes: 6 %
Neutrophils Absolute: 6.5 10*3/uL (ref 1.4–7.0)
Neutrophils: 78 %
Platelets: 312 10*3/uL (ref 150–450)
RBC: 2.62 x10E6/uL — CL (ref 3.77–5.28)
RDW: 18.7 % — ABNORMAL HIGH (ref 11.7–15.4)
WBC: 8.4 10*3/uL (ref 3.4–10.8)

## 2021-03-01 MED ORDER — TRELEGY ELLIPTA 100-62.5-25 MCG/INH IN AEPB: 1.0000 | INHALATION_SPRAY | Freq: Every day | RESPIRATORY_TRACT | 0 refills | Status: DC

## 2021-03-01 NOTE — Patient Instructions (Addendum)
No changes today  Recommendations: - Continue Trelegy 100 one puff daily (rinse mouth after use) - Use nebulizer every 6 hours as needed for breakthrough shortness of breath/wheezing  - I'll leave it up to you whether or not you want to schedule 6 minute walk test. I think you look a lot better and likely do not need oxygen   Orders: - New nebulizer machine   Follow-up: - 3 months with Dr. Melvyn Novas

## 2021-03-01 NOTE — Progress Notes (Signed)
Office Visit Note   Patient: Hannah French           Date of Birth: [birth year]           MRN: [id number] Visit Date: 03/01/2021              Requested by: Dettinger, Fransisca Kaufmann, MD [address] [place],  [place] [address] PCP: Dettinger, Fransisca Kaufmann, MD  Chief Complaint  Patient presents with  . Right Foot - Follow-up  . Left Foot - Follow-up      HPI: Patient presents today for follow-up for her history of gangrenous toes.  She has been using [place] tall and nitroglycerin patches.  After her last visit she had some changes on her right foot and she was instructed to put a half a patch on each foot.  Overall she feels she is doing well.  She was in the hospital recently and the patch was not applied.  She did notice some increased darkness to the left foot so for now since her right foot had improved significantly she was just focusing on the patch on the left foot.  Assessment & Plan: Visit Diagnoses: No diagnosis found.  Plan: Patient will follow-up in 6 weeks sooner if any concerns  Follow-Up Instructions: No follow-ups on file.   Ortho Exam  Patient is alert, oriented, no adenopathy, well-dressed, normal affect, normal respiratory effort. Right foot: No gangrene noted she has some discoloration which is very very mild in the fourth and fifth toe.  No gangrenous changes no cellulitis no signs of infection.  The left she has a very small superficial scab on the underside of the fourth toe.  Some discoloration of the third and fourth toes but no gangrene.  No swelling no cellulitis no signs of infection  Imaging: No results found. No images are attached to the encounter.  Labs: Lab Results  Component Value Date   HGBA1C 9.8 (H) 01/17/2021   HGBA1C 8.1 (H) 12/09/2020   HGBA1C 8.3 (H) 09/09/2020   LABURIC 9.0 (H) 10/08/2020     Lab Results  Component Value Date   ALBUMIN 4.2 02/28/2021   ALBUMIN 3.2 (L) 02/22/2021   ALBUMIN 2.8 (L) 02/20/2021    Lab Results   Component Value Date   MG 2.4 01/17/2021   MG 1.7 08/13/2018   Lab Results  Component Value Date   VD25OH 103.0 (H) 02/17/2020   VD25OH 54.9 08/08/2019   VD25OH 57.2 01/29/2019    No results found for: PREALBUMIN CBC EXTENDED Latest Ref Rng & Units 02/28/2021 02/24/2021 02/22/2021  WBC 3.4 - 10.8 x10E3/uL 8.4 7.8 9.8  RBC 3.77 - 5.28 x10E6/uL 2.62(LL) 2.47(LL) 2.43(L)  HGB 11.1 - 15.9 g/dL 8.6(LL) 8.1(LL) 8.2(L)  HCT 34.0 - 46.6 % 26.3(L) 25.2(L) 27.1(L)  PLT 150 - 450 x10E3/uL 312 241 198  NEUTROABS 1.4 - 7.0 x10E3/uL 6.5 6.1 -  LYMPHSABS 0.7 - 3.1 x10E3/uL 1.3 1.1 -     There is no height or weight on file to calculate BMI.  Orders:  No orders of the defined types were placed in this encounter.  No orders of the defined types were placed in this encounter.    Procedures: No procedures performed  Clinical Data: No additional findings.  ROS:  All other systems negative, except as noted in the HPI. Review of Systems  Objective: Vital Signs: There were no vitals taken for this visit.  Specialty Comments:  No specialty comments available.  PMFS History: Patient  Active Problem List   Diagnosis Date Noted  . Syncope 02/19/2021  . Acute GI bleeding 02/19/2021  . Acute on chronic blood loss anemia 02/19/2021  . Acute respiratory failure with hypoxia ([place]) 01/17/2021  . Elevated MCV 01/17/2021  . Leukocytosis 01/17/2021  . CKD (chronic kidney disease), stage III ([place]) 01/17/2021  . Hyperglycemia due to diabetes mellitus ([place]) 01/17/2021  . Skin abscess 01/15/2020  . Depression, recurrent ([place]) 08/08/2019  . Cigarette smoker 06/19/2019  . Vitamin D deficiency 01/29/2019  . Dizziness 12/03/2018  . Chronic combined systolic and diastolic CHF (congestive heart failure) ([place]) 12/03/2018  . Coronary artery disease involving native coronary artery of native heart with angina pectoris ([place]) 10/04/2018  . Hyperlipidemia 10/04/2018  . Pain in right hand 10/02/2018  .  Myalgia due to statin 03/29/2018  . Stage 3 severe COPD by GOLD classification ([place]) 10/26/2017  . Tobacco abuse 03/07/2016  . Type 2 diabetes mellitus ([place]) 08/26/2015  . Osteoporosis 09/02/2014  . Carotid stenosis 07/22/2012  . PVD 07/02/2009  . BLADDER CANCER 06/05/2009  . Hyperlipidemia associated with type 2 diabetes mellitus ([place]) 06/05/2009  . PSEUDOGOUT 06/05/2009  . MITRAL VALVE PROLAPSE 06/05/2009   Past Medical History:  Diagnosis Date  . Abdominal bruit   . Bladder cancer ([place])   . CAD (coronary artery disease)    a. nonobstructive by cath 08/2018.  . Carotid bruit   . Chronic combined systolic and diastolic CHF (congestive heart failure) ([place])   . CKD (chronic kidney disease), stage II   . Congestive heart failure (CHF) ([place])   . Diabetes mellitus    Type II  . Dyslipidemia   . Heart murmur   . Mild pulmonary hypertension ([place])   . Mitral valve prolapse    a. not seen on most recent echoes. Mild MR now.  Marland Kitchen NICM (nonischemic cardiomyopathy) ([place])   . Osteoporosis   . Pancreatitis, acute   . Pseudogout   . PVD (peripheral vascular disease) ([place])    a. 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  . Sinus tachycardia   . Tobacco abuse     Family History  Problem Relation Age of Onset  . Heart failure Mother   . Hypertension Sister   . Heart attack Brother   . Heart attack Maternal Uncle   . Heart disease Sister   . COPD Brother   . Congestive Heart Failure Brother   . Congestive Heart Failure Daughter   . Rheum arthritis Son   . Ovarian cancer Sister   . Coronary artery disease Neg Hx        Early    Past Surgical History:  Procedure Laterality Date  . ABDOMINAL AORTOGRAM W/LOWER EXTREMITY N/A 12/31/2019   Procedure: ABDOMINAL AORTOGRAM W/LOWER EXTREMITY;  Surgeon: Wellington Hampshire, MD;  Location: [facility];  Service: Cardiovascular;  Laterality: N/A;  . ABDOMINAL AORTOGRAM W/LOWER EXTREMITY N/A  09/01/2020   Procedure: ABDOMINAL AORTOGRAM W/LOWER EXTREMITY;  Surgeon: Wellington Hampshire, MD;  Location: [facility];  Service: Cardiovascular;  Laterality: N/A;  . APPENDECTOMY    . CORONARY STENT INTERVENTION N/A 10/13/2020   Procedure: CORONARY STENT INTERVENTION;  Surgeon: Leonie Man, MD;  Location: [facility];  Service: Cardiovascular;  Laterality: N/A;  . Hysterectomy-type unspecified    . INTRAVASCULAR PRESSURE WIRE/FFR STUDY N/A 10/13/2020   Procedure: INTRAVASCULAR PRESSURE WIRE/FFR STUDY;  Surgeon: Leonie Man, MD;  Location: [facility];  Service: Cardiovascular;  Laterality: N/A;  . LAPAROSCOPIC CHOLECYSTECTOMY  2009  . PERIPHERAL VASCULAR INTERVENTION  12/31/2019   Procedure: PERIPHERAL VASCULAR INTERVENTION;  Surgeon: Wellington Hampshire, MD;  Location: [facility];  Service: Cardiovascular;;  Bilateral [place]  . RIGHT/LEFT HEART CATH AND CORONARY ANGIOGRAPHY N/A 09/16/2018   Procedure: RIGHT/LEFT HEART CATH AND CORONARY ANGIOGRAPHY;  Surgeon: Sherren Mocha, MD;  Location: [facility];  Service: Cardiovascular;  Laterality: N/A;  . RIGHT/LEFT HEART CATH AND CORONARY ANGIOGRAPHY N/A 10/13/2020   Procedure: RIGHT/LEFT HEART CATH AND CORONARY ANGIOGRAPHY;  Surgeon: Jolaine Artist, MD;  Location: [facility];  Service: Cardiovascular;  Laterality: N/A;   Social History   Occupational History  . Occupation: Retired  Tobacco Use  . Smoking status: Current Every Day Smoker    Packs/day: 1.00    Years: 50.00    Pack years: 50.00    Types: Cigarettes  . Smokeless tobacco: Never Used  Vaping Use  . Vaping Use: Never used  Substance and Sexual Activity  . Alcohol use: No  . Drug use: No  . Sexual activity: Not Currently

## 2021-03-01 NOTE — Progress Notes (Addendum)
@Patient  ID: Hannah French, female    DOB: 01-14-1941, 80 y.o.   MRN: [id number]  Chief Complaint  Patient presents with   Follow-up    Pain to left side with coughing    Referring provider: Dettinger, Fransisca Kaufmann, MD  HPI: 80 year old female, current smoker. PMH significant for CHF, COPD, type 2 diabetes, CKD stage 3, bladder cancer. Patient of Dr. Melvyn Novas, seen for initial consult back in September 2020. Follow up was as needed.   She was recently admitted for acute hypoxic respiratory felt to be related  to combined systolic and diastolic HF and COPD exacerbation from 01/16/21 - 01/19/21. COPD exacerbations was treated with azithromycin, IV steriods, bronchodilators and mucolytics. Spirometry in September 2020 showed severe COPD, FEV1 0.96L.   Previous [facility] pulmonary encounter: 02/01/2021 Patient presents today for hospital follow-up. Accompanied by her family. He is feeling well. She still has a productive cough with green-brown mucus. Since being discharged from hospital she has only been using her Advair once daily in the morning. She has been compliant with Atrovent nebulizer four times a day. Family states that Levalbuterol seems to works better for her cough. She would like to try and get away from breathing treatments. She is taking 20-40mg  lasix daily, she will take an additional tablet when experiencing more leg swelling. She does not want to repeat PFTs. She is not ready to quit smoking.   03/01/2021- Interim hx  Patient presents today for 1 month follow-up. She is doing well today, no acute complaints. Respiratory wise she is a lot better. She completed course of oral Doxycycline. She states that Trelegy Ellipta inhaler works 10 times better. She is applying for patient assistance with her primary care. Ankle swelling has also been better. She is taking lasix 40mg  daily. She took additional diruetic for three days as instructed by Dr. Haroldine Laws.     Allergies  Allergen  Reactions   Contrast Media [Iodinated Diagnostic Agents] Shortness Of Breath   Iohexol Other (See Comments)    PASSED OUT DURING THE TEST     Metformin Swelling    Face and hands became swollen   Statins Swelling and Other (See Comments)    They make the patient hurt "all over" Myalgia, also   Bextra [Valdecoxib] Nausea And Vomiting and Swelling    Shut down my kidneys    Cefdinir Other (See Comments)    Pancreatitis    Cozaar [Losartan Potassium] Other (See Comments)    dizziness   Gabapentin    Nsaids Other (See Comments)    GI intolerance and pain all over, tolerates ibu    Zetia [Ezetimibe] Swelling   Rofecoxib Other (See Comments)    "VIOXX"- "SHUT DOWN MY KIDNEYS"   Sulfa Antibiotics Nausea And Vomiting and Other (See Comments)    Pancreatitis    Immunization History  Administered Date(s) Administered   Fluad Quad(high Dose 65+) 06/14/2019, 07/22/2020   Influenza Inj Mdck Quad Pf 06/14/2019   Influenza, High Dose Seasonal PF 08/14/2017   Influenza,inj,Quad PF,6+ Mos 08/06/2013, 07/20/2014, 07/30/2015   Influenza-Unspecified 07/05/2018   Moderna Sars-Covid-2 Vaccination 11/06/2019, 12/05/2019, 06/16/2020   Pneumococcal Conjugate-13 09/09/2020   Pneumococcal Polysaccharide-23 08/08/2019   Tdap 03/17/2011   Zoster Recombinat (Shingrix) 11/14/2017, 04/19/2018   Zoster, Live 05/12/2010    Past Medical History:  Diagnosis Date   Abdominal bruit    Bladder cancer ([place])    CAD (coronary artery disease)    a. nonobstructive by cath 08/2018.  Carotid bruit    Chronic combined systolic and diastolic CHF (congestive heart failure) (HCC)    CKD (chronic kidney disease), stage II    Congestive heart failure (CHF) (HCC)    Diabetes mellitus    Type II   Dyslipidemia    Heart murmur    Mild pulmonary hypertension (HCC)    Mitral valve prolapse    a. not seen on most recent echoes. Mild MR now.   NICM (nonischemic cardiomyopathy) ([place])    Osteoporosis     Pancreatitis, acute    Pseudogout    PVD (peripheral vascular disease) ([place])    a. 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   Sinus tachycardia    Tobacco abuse     Tobacco History: Social History   Tobacco Use  Smoking Status Every Day   Packs/day: 1.00   Years: 50.00   Pack years: 50.00   Types: Cigarettes  Smokeless Tobacco Never  Tobacco Comments   still smoking as of 03/01/21   Ready to quit: Not Answered Counseling given: Not Answered Tobacco comments: still smoking as of 03/01/21   No facility-administered medications prior to visit.   Outpatient Medications Prior to Visit  Medication Sig Dispense Refill   acetaminophen (TYLENOL) 500 MG tablet Take 500 mg by mouth every 6 (six) hours as needed for moderate pain.     albuterol (VENTOLIN HFA) 108 (90 Base) MCG/ACT inhaler Inhale 2 puffs into the lungs every 6 (six) hours as needed for wheezing or shortness of breath. 18 g 5   Cholecalciferol (VITAMIN D) 50 MCG (2000 UT) tablet Take 4,000 Units by mouth daily.      clopidogrel (PLAVIX) 75 MG tablet Take 1 tablet (75 mg total) by mouth daily. 90 tablet 2   Fluticasone-Umeclidin-Vilant (TRELEGY ELLIPTA) 100-62.5-25 MCG/INH AEPB Inhale 1 puff into the lungs daily. 60 each 5   furosemide (LASIX) 20 MG tablet Take 2 tablets daily. You may take an extra dose for increase weight gain orswelling 180 tablet 3   glimepiride (AMARYL) 2 MG tablet Take 1-2 tablets (2-4 mg total) by mouth daily with breakfast. 180 tablet 3   insulin aspart (NOVOLOG) 100 UNIT/ML injection Inject 5-20 Units into the skin 3 (three) times daily before meals. 10 mL 1   insulin degludec (TRESIBA FLEXTOUCH) 100 UNIT/ML FlexTouch Pen Inject 10 Units into the skin daily. 9 mL 3   Naphazoline-Pheniramine (OPCON-A) 0.027-0.315 % SOLN Place 1 drop into both eyes daily as needed (Burning and itching eyes).     nitroGLYCERIN (NITRODUR - DOSED IN MG/24 HR) 0.2 mg/hr patch  Place 1 patch (0.2 mg total) onto the skin daily. 30 patch 12   nitroGLYCERIN (NITROSTAT) 0.3 MG SL tablet Place 1 tablet (0.3 mg total) under the tongue every 5 (five) minutes as needed for chest pain. 100 tablet 3   pantoprazole (PROTONIX) 40 MG tablet Take 1 tablet (40 mg total) by mouth 2 (two) times daily for 20 days. 40 tablet 1   senna-docusate (SENOKOT-S) 8.6-50 MG tablet Take 1 tablet by mouth at bedtime. 30 tablet 0   ferrous sulfate 325 (65 FE) MG tablet Take 1 tablet (325 mg total) by mouth daily with breakfast. 30 tablet 1   insulin lispro (HUMALOG) 100 UNIT/ML injection      levalbuterol (XOPENEX) 0.63 MG/3ML nebulizer solution Take 3 mLs (0.63 mg total) by nebulization every 4 (four) hours as needed for wheezing or shortness of breath. 3 mL 2   metoprolol succinate (  TOPROL-XL) 25 MG 24 hr tablet Take 1 tablet (25 mg total) by mouth daily. 90 tablet 3   pentoxifylline (TRENTAL) 400 MG CR tablet Take 1 tablet (400 mg total) by mouth 3 (three) times daily with meals. 90 tablet 3   Review of Systems  Review of Systems  Constitutional: Negative.   HENT: Negative.    Respiratory:  Negative for cough, chest tightness, shortness of breath and wheezing.   Cardiovascular: Negative.  Negative for leg swelling.  Physical Exam  BP 114/78 (BP Location: Right Arm, Cuff Size: Normal)   Pulse (!) 101   Temp 98.4 F (36.9 C) (Temporal)   Ht 4\' 11"  (1.499 m)   Wt 115 lb (52.2 kg)   SpO2 97%   BMI 23.23 kg/m  Physical Exam Constitutional:      Appearance: Normal appearance.  HENT:     Head: Normocephalic and atraumatic.  Cardiovascular:     Rate and Rhythm: Normal rate and regular rhythm.     Comments: Trace edema right LE Pulmonary:     Effort: Pulmonary effort is normal.     Breath sounds: Normal breath sounds.  Musculoskeletal:        General: Normal range of motion.  Skin:    General: Skin is warm and dry.  Neurological:     General: No focal deficit present.     Mental  Status: She is alert and oriented to person, place, and time. Mental status is at baseline.  Psychiatric:        Mood and Affect: Mood normal.        Behavior: Behavior normal.        Thought Content: Thought content normal.        Judgment: Judgment normal.     Lab Results:  CBC    Component Value Date/Time   WBC 11.8 (H) 04/11/2021 0045   RBC 3.31 (L) 04/11/2021 0045   HGB 10.4 (L) 04/11/2021 0045   HGB 11.1 04/01/2021 1450   HCT 33.8 (L) 04/11/2021 0045   HCT 33.9 (L) 04/01/2021 1450   PLT 289 04/11/2021 0045   PLT 448 04/01/2021 1450   MCV 102.1 (H) 04/11/2021 0045   MCV 95 04/01/2021 1450   MCH 31.4 04/11/2021 0045   MCHC 30.8 04/11/2021 0045   RDW 23.9 (H) 04/11/2021 0045   RDW 21.1 (H) 04/01/2021 1450   LYMPHSABS 1.8 04/11/2021 0045   LYMPHSABS 1.7 04/01/2021 1450   MONOABS 1.1 (H) 04/11/2021 0045   EOSABS 0.4 04/11/2021 0045   EOSABS 0.3 04/01/2021 1450   BASOSABS 0.0 04/11/2021 0045   BASOSABS 0.1 04/01/2021 1450    BMET    Component Value Date/Time   NA 139 04/11/2021 0045   NA 140 04/01/2021 1450   K 3.4 (L) 04/11/2021 0045   CL 94 (L) 04/11/2021 0045   CO2 35 (H) 04/11/2021 0045   GLUCOSE 332 (H) 04/11/2021 0045   BUN 55 (H) 04/11/2021 0045   BUN 29 (H) 04/01/2021 1450   CREATININE 1.87 (H) 04/11/2021 0045   CREATININE 0.67 02/06/2013 0932   CALCIUM 8.9 04/11/2021 0045   GFRNONAA 27 (L) 04/11/2021 0045   GFRNONAA 88 02/06/2013 0932   GFRAA 44 (L) 09/09/2020 1131   GFRAA >89 02/06/2013 0932    BNP    Component Value Date/Time   BNP 918.0 (H) 04/06/2021 0347    ProBNP    Component Value Date/Time   PROBNP 4,124 (H) 10/04/2018 1257    Imaging: DG Chest 1 View  Result Date: 04/06/2021 CLINICAL DATA:  80 year old female intubated. Shortness of breath and tachycardia. EXAM: CHEST  1 VIEW COMPARISON:  Portable chest 0322 hours today and earlier. FINDINGS: Portable AP supine view at 0404 hours. Intubated with endotracheal tube tip in good  position between the clavicles and carina. Enteric tube courses to the abdomen and side hole is at the level of the gastric fundus. Mediastinal contours are stable and within normal limits. Coarse bilateral pulmonary interstitial opacity persists, progressed since 04/01/2021. No pneumothorax, pleural effusion or consolidation. Stable visualized osseous structures. IMPRESSION: 1. Endotracheal tube and enteric tube in good position. 2. Ongoing coarse bilateral pulmonary interstitial opacity, acute new since 04/01/2021, with differential considerations of viral/atypical pneumonia versus pulmonary edema. Electronically Signed   By: Genevie Ann M.D.   On: 04/06/2021 04:18   DG Chest 2 View  Result Date: 04/02/2021 CLINICAL DATA:  Cough and congestion.  COPD versus CHF. EXAM: CHEST - 2 VIEW COMPARISON:  Feb 19, 2021 FINDINGS: The heart size is borderline. The hila and mediastinum are unremarkable. No pneumothorax. No nodules or masses. Mild Kerley B lines identified at the bases, particularly on the left, not seen on the Feb 19, 2021 study. No other significant abnormalities. IMPRESSION: Nicholes Mango, new since Feb 19, 2021, consistent with very mild pulmonary edema. Electronically Signed   By: Dorise Bullion III M.D   On: 04/02/2021 15:17   US RENAL  Result Date: 04/10/2021 CLINICAL DATA:  Acute renal insufficiency, remote history of bladder cancer EXAM: RENAL / URINARY TRACT ULTRASOUND COMPLETE COMPARISON:  01/14/2021 FINDINGS: Right Kidney: Renal measurements: 8.9 x 3.4 x 3.7 cm = volume: 58.4 mL. Echogenicity within normal limits. No mass or hydronephrosis visualized. Left Kidney: Renal measurements: 11.4 x 4.4 x 5.2 cm = volume: 135.3 mL. Echogenicity within normal limits. Lobular contour of the left kidney is again identified, with duplicated left renal collecting system as described on numerous previous CT exams. No masses or hydronephrosis visualized. Bladder: Appears normal for degree of bladder distention.  Other: None. IMPRESSION: 1. 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 2. Otherwise unremarkable renal ultrasound. Electronically Signed   By: Randa Ngo M.D.   On: 04/10/2021 16:56   DG CHEST PORT 1 VIEW  Result Date: 04/07/2021 CLINICAL DATA:  ET tube EXAM: PORTABLE CHEST 1 VIEW COMPARISON:  None.  April 06, 2021. FINDINGS: Endotracheal tube tip is approximately 4.2 cm above the carina. Left IJ approach central venous catheter with the tip projecting in the region of the superior cavoatrial junction, similar to prior. Gastric tube courses below the diaphragm with the tip outside the field of view. Similar cardiomediastinal silhouette. Aortic atherosclerosis. Suspected slight improvement in now mild prominent interstitial markings, similar to the prior. No new consolidation. No visible pleural effusions or pneumothorax on this single AP semierect radiograph. No visible pleural effusions or pneumothorax. No acute osseous abnormality. IMPRESSION: 1. Suspected slight improvement in mildly prominent interstitial markings. 2. Support devices as detailed above. Electronically Signed   By: Margaretha Sheffield MD   On: 04/07/2021 09:07   DG CHEST PORT 1 VIEW  Result Date: 04/06/2021 CLINICAL DATA:  Central line placement. EXAM: PORTABLE CHEST 1 VIEW COMPARISON:  April 06, 2021. FINDINGS: LEFT-sided IJ central venous line terminates at the caval to atrial junction. Endotracheal tube terminates approximately 3.6 cm above the carina. Gastric tube with side port below GE junction, tip off the field of view. Cardiomediastinal contours  and hilar structures are stable. Structure overlies the LEFT upper chest in there is also likely in associated skin fold in this area. No sign of pneumothorax. No effusion on frontal radiograph. Increased interstitial markings may be slightly improved compared with previous imaging accounting for differences in technique. On  limited assessment no acute skeletal process. IMPRESSION: 1. LEFT IJ central venous line terminates at the caval to atrial junction. No sign of pneumothorax. 2. Additional support apparatus without change. 3. Potential improvement with respect to interstitial markings accounting for differences in technique, attention on follow-up. Electronically Signed   By: Zetta Bills M.D.   On: 04/06/2021 12:40   DG Chest Portable 1 View  Result Date: 04/06/2021 CLINICAL DATA:  Shortness of breath, confusion, tachycardia EXAM: PORTABLE CHEST 1 VIEW COMPARISON:  Radiograph 04/01/2021 FINDINGS: Markedly increased coarse reticulonodular opacities are present throughout both lungs with indistinct pulmonary vascularity. No pneumothorax or visible effusion. No focal consolidative process is seen. Stable cardiomediastinal contours with a calcified aorta. No acute osseous or soft tissue abnormality. IMPRESSION: Interval development of diffuse heterogeneous reticular opacities throughout the lungs with peripheral septal thickening. Rapid development favoring pulmonary edema versus atypical infection/interstitial inflammation. Aortic Atherosclerosis (ICD10-I70.0). Electronically Signed   By: Lovena Le M.D.   On: 04/06/2021 03:31   ECHOCARDIOGRAM COMPLETE  Result Date: 04/06/2021    ECHOCARDIOGRAM REPORT   Patient Name:   MAXX CALAWAY Date of Exam: 04/06/2021 Medical Rec #:  [id number]              Height:       59.0 in Accession #:    [id number]             Weight:       112.4 lb Date of Birth:  1941-09-25              BSA:          1.444 m Patient Age:    62 years               BP:           88/48 mmHg Patient Gender: F                      HR:           85 bpm. Exam Location:  Inpatient Procedure: 2D Echo, Cardiac Doppler and Color Doppler Indications:    CHF  History:        Patient has prior history of Echocardiogram examinations, most                 recent 10/21/2020. CHF and Cardiomyopathy, CAD, PAD and COPD,                  Signs/Symptoms:Shortness of Breath and CKD; Risk Factors:Current                 Smoker, Diabetes and Hypertension.  Sonographer:    Dustin Flock Referring Phys: [id number] [place]  1. Left ventricular ejection fraction, by estimation, is 25 to 30%. The left ventricle has severely decreased function. The left ventricle demonstrates regional wall motion abnormalities with severe septal hypokinesis and basal to mid inferior akinesis.  There is septal-lateral dyssynchrony consistent with LBBB. Left ventricular diastolic parameters are consistent with Grade II diastolic dysfunction (pseudonormalization).  2. Right ventricular systolic function is mildly reduced. The right ventricular size is normal. Tricuspid regurgitation signal is inadequate for assessing PA pressure.  3. Left atrial size was mildly dilated.  4. The mitral valve is normal in structure. Trivial mitral valve regurgitation. No evidence of mitral stenosis.  5. The aortic valve is tricuspid. Aortic valve regurgitation is not visualized. Mild aortic valve sclerosis is present, with no evidence of aortic valve stenosis.  6. The inferior vena cava is normal in size with <50% respiratory variability, suggesting right atrial pressure of 8 mmHg. FINDINGS  Left Ventricle: Left ventricular ejection fraction, by estimation, is 25 to 30%. The left ventricle has severely decreased function. The left ventricle demonstrates regional wall motion abnormalities. The left ventricular internal cavity size was normal  in size. There is no left ventricular hypertrophy. Left ventricular diastolic parameters are consistent with Grade II diastolic dysfunction (pseudonormalization). Right Ventricle: The right ventricular size is normal. No increase in right ventricular wall thickness. Right ventricular systolic function is mildly reduced. Tricuspid regurgitation signal is inadequate for assessing PA pressure. Left Atrium: Left atrial size was  mildly dilated. Right Atrium: Right atrial size was normal in size. Pericardium: There is no evidence of pericardial effusion. Mitral Valve: The mitral valve is normal in structure. Mild mitral annular calcification. Trivial mitral valve regurgitation. No evidence of mitral valve stenosis. Tricuspid Valve: The tricuspid valve is normal in structure. Tricuspid valve regurgitation is trivial. Aortic Valve: The aortic valve is tricuspid. Aortic valve regurgitation is not visualized. Mild aortic valve sclerosis is present, with no evidence of aortic valve stenosis. Aortic valve peak gradient measures 8.6 mmHg. Pulmonic Valve: The pulmonic valve was normal in structure. Pulmonic valve regurgitation is not visualized. Aorta: The aortic root is normal in size and structure. Venous: The inferior vena cava is normal in size with less than 50% respiratory variability, suggesting right atrial pressure of 8 mmHg. IAS/Shunts: No atrial level shunt detected by color flow Doppler.  LEFT VENTRICLE PLAX 2D LVIDd:         5.00 cm  Diastology LVIDs:         3.80 cm  LV e' medial:    5.22 cm/s LV PW:         1.10 cm  LV E/e' medial:  16.5 LV IVS:        1.10 cm  LV e' lateral:   5.33 cm/s LVOT diam:     1.90 cm  LV E/e' lateral: 16.2 LV SV:         72 LV SV Index:   50 LVOT Area:     2.84 cm  RIGHT VENTRICLE RV Basal diam:  2.70 cm RV S prime:     7.18 cm/s TAPSE (M-mode): 1.3 cm LEFT ATRIUM             Index       RIGHT ATRIUM           Index LA diam:        3.50 cm 2.42 cm/m  RA Area:     11.60 cm LA Vol (A2C):   41.7 ml 28.88 ml/m RA Volume:   25.50 ml  17.66 ml/m LA Vol (A4C):   40.1 ml 27.77 ml/m LA Biplane Vol: 42.8 ml 29.64 ml/m  AORTIC VALVE AV Area (Vmax): 2.14 cm AV Vmax:        147.00 cm/s AV Peak Grad:   8.6 mmHg LVOT Vmax:      111.00 cm/s LVOT Vmean:     73.600 cm/s LVOT VTI:       0.253 m  AORTA Ao Root diam: 2.40 cm MITRAL  VALVE MV Area (PHT): 5.97 cm    SHUNTS MV Decel Time: 127 msec    Systemic VTI:  0.25 m  MV E velocity: 86.30 cm/s  Systemic Diam: 1.90 cm MV A velocity: 79.60 cm/s MV E/A ratio:  1.08 Loralie Champagne MD Electronically signed by Loralie Champagne MD Signature Date/Time: 04/06/2021/4:22:38 PM    Final       Assessment & Plan:   Stage 3 severe COPD by GOLD classification ([place]) - Active smoker. Spirometry 06/10/2928 showed severe COPD, FEV1 0.96%. Treated for AECOPD in May with Doxycycline. She reports significant improved with Addition of Trelegy 116mcg daily, applying for patient assistance with PCP. She is not ready to quit smoking, declined full PFTs. Follow up in 3 months with Dr. Melvyn Novas.   Chronic combined systolic and diastolic CHF (congestive heart failure) (HCC) - Ankles swelling has improved since last visit, trace right lower extremity - She is maintained on lasix 40mg  daily - Following with Dr. Haroldine Laws   Respiratory failure [facility]) - Ordered for 6MWT during last visit, patient declined testing. O2 today was 97% RA walking back to exam room. Breathing non-labored.      Martyn Ehrich, NP 04/11/2021

## 2021-03-03 ENCOUNTER — Ambulatory Visit (INDEPENDENT_AMBULATORY_CARE_PROVIDER_SITE_OTHER): Payer: Medicare Other | Admitting: *Deleted

## 2021-03-03 DIAGNOSIS — E1169 Type 2 diabetes mellitus with other specified complication: Secondary | ICD-10-CM

## 2021-03-03 DIAGNOSIS — I5022 Chronic systolic (congestive) heart failure: Secondary | ICD-10-CM

## 2021-03-06 ENCOUNTER — Encounter (HOSPITAL_COMMUNITY): Payer: Self-pay

## 2021-03-06 NOTE — Telephone Encounter (Signed)
Dena, please let patient know I reviewed her CBC completed 6/6. Hemoglobin remains low but is improved to 8.6. Recommend repeating the week of 6/20. Dx: Anemia. Please arrange.   She should continue to monitor for rectal bleeding or pitch black stools and let us know if this occurs.   Manuela Schwartz: I do not see that patient's hospital follow-up has been arranged. Requested f/u in 2-4 weeks. See original message for details. Ok to schedule with APP or RMR.

## 2021-03-07 ENCOUNTER — Other Ambulatory Visit: Payer: Self-pay

## 2021-03-07 DIAGNOSIS — D649 Anemia, unspecified: Secondary | ICD-10-CM

## 2021-03-07 DIAGNOSIS — R195 Other fecal abnormalities: Secondary | ICD-10-CM

## 2021-03-07 NOTE — Telephone Encounter (Signed)
Pt returned the call and advised me that she has never been here before and she didn't know who Cyril Mourning was I advised the pt that she was seen by her in the hospital and that we needed  her to repeat some blood work and that her hemoglobin remains low but is improving.advised to monitor her stools and advise [place] if there is blood or pitch black stools. I advised I will put labs in the mail to her so she can have it done at [facility] inside of [facility] hospital. The pt stated her PCP keeps up with all of her blood work and that she does not need to have it done again. She also advised me that if her PCP thinks she should have it done then she will do that, otherwise she is following up with him for her care. Cyril Mourning do you want me to still mail her the forms? Please advise

## 2021-03-07 NOTE — Telephone Encounter (Signed)
PCCs can we have an update on pt's nebulizer? Please advise on following My Chart message:   This is Hannah French's daughter,Hannah French. We were wondering if you heard anything on a nebulizer? We haven't been contacted by anyone and her pharmacy said we bought the only one they had. Mom has a block on her phone so you may need to give them my # [phone]. Thank you  Thank you

## 2021-03-07 NOTE — Progress Notes (Signed)
ERROR

## 2021-03-07 NOTE — Telephone Encounter (Signed)
Noted. If she doesn't want to have blood work with [place], that is ok. She can continue to follow-up with PCP for additional blood work as they recommend.

## 2021-03-07 NOTE — Telephone Encounter (Signed)
Phoned and LMOVM for the pt to return call and that I will put lab req in the mail for her to have labs drawn next week.

## 2021-03-07 NOTE — Telephone Encounter (Signed)
Order for Neb was sent to [facility] on 6/7 and 6/13 via [employer]. No confirmation. Called [facility] who states they did not receive it and advised to fax it. Faxed order and rec'd fax confirmation. Their office will be in touch soon.

## 2021-03-07 NOTE — Telephone Encounter (Signed)
ON AUGUST RECALL TO HAVE HOS FU WITH RMR. EVERYONE ELSE IS IN November SCHEDULE.

## 2021-03-08 ENCOUNTER — Ambulatory Visit: Payer: Medicare Other | Admitting: Pharmacist

## 2021-03-08 NOTE — Telephone Encounter (Signed)
noted 

## 2021-03-10 ENCOUNTER — Other Ambulatory Visit: Payer: Self-pay | Admitting: Orthopedic Surgery

## 2021-03-10 ENCOUNTER — Encounter: Payer: Self-pay | Admitting: Gastroenterology

## 2021-03-10 ENCOUNTER — Other Ambulatory Visit: Payer: Self-pay | Admitting: Internal Medicine

## 2021-03-14 ENCOUNTER — Ambulatory Visit: Payer: Medicare Other | Admitting: Family Medicine

## 2021-03-14 NOTE — Telephone Encounter (Signed)
Patient is still waiting on nebulizer ordered 03/01/21 to [facility]. Order was placed 03/01/21. Called [facility]  to follow up on delay.  Estill Bamberg with [facility] stated there had been issues trying to contact Patient for delivery.  Estill Bamberg stated she had just spoke with Patient's daughter and delivery is scheduled for 03/16/21.

## 2021-03-16 ENCOUNTER — Other Ambulatory Visit (HOSPITAL_COMMUNITY): Payer: Self-pay | Admitting: Internal Medicine

## 2021-03-16 DIAGNOSIS — J449 Chronic obstructive pulmonary disease, unspecified: Secondary | ICD-10-CM | POA: Diagnosis not present

## 2021-03-17 ENCOUNTER — Ambulatory Visit: Payer: Medicare Other | Admitting: Pharmacist

## 2021-03-17 ENCOUNTER — Other Ambulatory Visit: Payer: Self-pay

## 2021-03-17 DIAGNOSIS — E1169 Type 2 diabetes mellitus with other specified complication: Secondary | ICD-10-CM

## 2021-03-17 DIAGNOSIS — I5022 Chronic systolic (congestive) heart failure: Secondary | ICD-10-CM

## 2021-03-17 NOTE — Progress Notes (Signed)
Chronic Care Management Pharmacy Note  03/17/2021 Name:  Hannah French MRN:  [id number] DOB:  1941-07-11  Summary: diabetes management/prescription assistance  Recommendations/Changes made from today's visit: Diabetes: Uncontrolled; current treatment: TRESIBA, NOVOLOG;  Daughter/son oversee medications; patient can be forgetful when administering medications [phone] daughter's number) Will consider GLP1 moving forwards as son/daughter can administer it once weekly Current glucose readings: fasting glucose: 200-300, post prandial glucose: 300s Denies hypoglycemic/hyperglycemic symptoms Discussed meal planning options and Plate method for healthy eating Avoid sugary drinks and desserts Incorporate balanced protein, non starchy veggies, 1 serving of carbohydrate with each meal Increase water intake Increase physical activity as able Current exercise: N/A Counseled on medication administration; sliding scale insulin provided & printed out for patient Assessed patient finances. Patient is enrolled with health department for patient assistance; asked daughter to let us know if/when we need to apply for medications  Plan: f/u 03/30/21  Subjective: Hannah French is an 80 y.o. year old female who is a primary patient of Dettinger, Fransisca Kaufmann, MD.  The CCM team was consulted for assistance with disease management and care coordination needs.    Engaged with patient face to face for initial visit in response to provider referral for pharmacy case management and/or care coordination services.   Consent to Services:  The patient was given information about Chronic Care Management services, agreed to services, and gave verbal consent prior to initiation of services.  Please see initial visit note for detailed documentation.   Patient Care Team: Dettinger, Fransisca Kaufmann, MD as PCP - General (Family Medicine) Bensimhon, Shaune Pascal, MD as PCP - Cardiology  (Cardiology) Bensimhon, Shaune Pascal, MD as Consulting Physician (Cardiology) Ilean China, RN as Case Manager Lavera Guise, [facility] as Pharmacist (Family Medicine)  Recent office visits: 02/28/2021  Hospital visits: ED ON 5/28   Objective:  Lab Results  Component Value Date   CREATININE 1.38 (H) 02/28/2021   CREATININE 1.13 (H) 02/22/2021   CREATININE 1.29 (H) 02/20/2021    Lab Results  Component Value Date   HGBA1C 9.8 (H) 01/17/2021   Last diabetic Eye exam: No results found for: HMDIABEYEEXA  Last diabetic Foot exam: No results found for: HMDIABFOOTEX      Component Value Date/Time   CHOL 157 02/17/2020 1159   TRIG 129 02/17/2020 1159   TRIG 82 06/09/2014 1028   HDL 41 02/17/2020 1159   HDL 57 06/09/2014 1028   CHOLHDL 3.8 02/17/2020 1159   CHOLHDL 3.5 02/06/2013 0932   VLDL 20 02/06/2013 0932   LDLCALC 93 02/17/2020 1159   LDLCALC 131 (H) 06/09/2014 1028    Hepatic Function Latest Ref Rng & Units 02/28/2021 02/22/2021 02/20/2021  Total Protein 6.0 - 8.5 g/dL 6.5 5.5(L) 4.8(L)  Albumin 3.7 - 4.7 g/dL 4.2 3.2(L) 2.8(L)  AST 0 - 40 IU/L _0 ALT 0 - 32 IU/L _1 Alk Phosphatase 44 - 121 IU/L 72 48 42  Total Bilirubin 0.0 - 1.2 mg/dL 0.4 1.4(H) 1.5(H)  Bilirubin, Direct 0.0 - 0.2 mg/dL - 0.1 -    Lab Results  Component Value Date/Time   TSH 2.540 06/09/2014 10:28 AM    CBC Latest Ref Rng & Units 02/28/2021 02/24/2021 02/22/2021  WBC 3.4 - 10.8 x10E3/uL 8.4 7.8 9.8  Hemoglobin 11.1 - 15.9 g/dL 8.6(LL) 8.1(LL) 8.2(L)  Hematocrit 34.0 - 46.6 % 26.3(L) 25.2(L) 27.1(L)  Platelets 150 - 450 x10E3/uL 312 241 198    Lab  Results  Component Value Date/Time   VD25OH 103.0 (H) 02/17/2020 11:59 AM   VD25OH 54.9 08/08/2019 01:32 PM    Clinical ASCVD: No  The ASCVD Risk score Mikey Bussing DC Jr., et al., 2013) failed to calculate for the following reasons:   The 2013 ASCVD risk score is only valid for ages 39 to 51    Other: (CHADS2VASc if Afib, PHQ9 if depression,  MMRC or CAT for COPD, ACT, DEXA)  Social History   Tobacco Use  Smoking Status Every Day   Packs/day: 1.00   Years: 50.00   Pack years: 50.00   Types: Cigarettes  Smokeless Tobacco Never  Tobacco Comments   still smoking as of 03/01/21   BP Readings from Last 3 Encounters:  03/01/21 114/78  02/28/21 111/68  02/22/21 (!) 117/92   Pulse Readings from Last 3 Encounters:  03/01/21 (!) 101  02/28/21 (!) 113  02/22/21 (!) 110   Wt Readings from Last 3 Encounters:  03/01/21 115 lb (52.2 kg)  02/28/21 114 lb (51.7 kg)  02/22/21 117 lb 15.1 oz (53.5 kg)    Assessment: Review of patient past medical history, allergies, medications, health status, including review of consultants reports, laboratory and other test data, was performed as part of comprehensive evaluation and provision of chronic care management services.   SDOH:  (Social Determinants of Health) assessments and interventions performed:    CCM Care Plan  Allergies  Allergen Reactions   Contrast Media [Iodinated Diagnostic Agents] Shortness Of Breath   Iohexol Other (See Comments)    PASSED OUT DURING THE TEST     Metformin Swelling    Face and hands became swollen   Statins Swelling and Other (See Comments)    They make the patient hurt "all over" Myalgia, also   Bextra [Valdecoxib] Nausea And Vomiting and Swelling    Shut down my kidneys    Cefdinir Other (See Comments)    Pancreatitis    Cozaar [Losartan Potassium] Other (See Comments)    dizziness   Gabapentin    Nsaids Other (See Comments)    GI intolerance and pain all over, tolerates ibu    Zetia [Ezetimibe] Swelling   Rofecoxib Other (See Comments)    "VIOXX"- "SHUT DOWN MY KIDNEYS"   Sulfa Antibiotics Nausea And Vomiting and Other (See Comments)    Pancreatitis    Medications Reviewed Today     Reviewed by Lavera Guise, [facility] (Pharmacist) on 03/17/21 at 1424  Med List Status: <None>   Medication Order Taking? Sig Documenting Provider Last  Dose Status Informant  acetaminophen (TYLENOL) 500 MG tablet [id number]  Take 500 mg by mouth every 6 (six) hours as needed for moderate pain. [provider]  Active Child  albuterol (VENTOLIN HFA) 108 (90 Base) MCG/ACT inhaler [id number]  Inhale 2 puffs into the lungs every 6 (six) hours as needed for wheezing or shortness of breath. Martyn Ehrich, NP  Active Child  Cholecalciferol (VITAMIN D) 50 MCG (2000 UT) tablet [id number]  Take 4,000 Units by mouth daily.  [provider]  Active Child           Med Note Dimitri Ped, AMANDA L   Tue May 18, 2020  2:01 PM)    clopidogrel (PLAVIX) 75 MG tablet [id number]  Take 1 tablet (75 mg total) by mouth daily. Cheryln Manly, NP  Active Child  ferrous sulfate 325 (65 FE) MG tablet [id number]  Take 1 tablet (325 mg total) by mouth daily with breakfast. Shahmehdi,  Valeria Batman, MD  Active   Fluticasone-Umeclidin-Vilant (TRELEGY ELLIPTA) 100-62.5-25 MCG/INH AEPB [id number]  Inhale 1 puff into the lungs daily. Martyn Ehrich, NP  Active Child  furosemide (LASIX) 20 MG tablet [id number]  Take 2 tablets daily. You may take an extra dose for increase weight gain orswelling Dettinger, Fransisca Kaufmann, MD  Active Child  glimepiride (AMARYL) 2 MG tablet [id number]  Take 1-2 tablets (2-4 mg total) by mouth daily with breakfast. Dettinger, Fransisca Kaufmann, MD  Active Child  insulin aspart (NOVOLOG) 100 UNIT/ML injection [id number]  Inject 5-20 Units into the skin 3 (three) times daily before meals. Dettinger, Fransisca Kaufmann, MD  Active Child  insulin degludec (TRESIBA FLEXTOUCH) 100 UNIT/ML FlexTouch Pen [id number]  Inject 10 Units into the skin daily. Dettinger, Fransisca Kaufmann, MD  Active Child           Med Note Parthenia Ames Mar 17, 2021  2:23 PM) 20 units   levalbuterol (XOPENEX) 0.63 MG/3ML nebulizer solution [id number]  NEBULIZE 1 VIAL EVERY 4 HOURS AS NEEDED FOR WHEEZING OR SHORTNESS OF Hulan Amato, MD  Active   metoprolol succinate (TOPROL-XL) 25 MG 24 hr  tablet [id number]  TAKE 1 TABLET DAILY Bensimhon, Shaune Pascal, MD  Active   Naphazoline-Pheniramine (OPCON-A) 0.027-0.315 % SOLN [id number]  Place 1 drop into both eyes daily as needed (Burning and itching eyes). [provider]  Active Child  nitroGLYCERIN (NITRODUR - DOSED IN MG/24 HR) 0.2 mg/hr patch [id number]  Place 1 patch (0.2 mg total) onto the skin daily. Newt Minion, MD  Active   nitroGLYCERIN (NITROSTAT) 0.3 MG SL tablet [id number]  Place 1 tablet (0.3 mg total) under the tongue every 5 (five) minutes as needed for chest pain. Dettinger, Fransisca Kaufmann, MD  Active Child  pantoprazole (PROTONIX) 40 MG tablet [id number]  Take 1 tablet (40 mg total) by mouth 2 (two) times daily for 20 days. Deatra James, MD  Expired 03/14/21 2359   pentoxifylline (TRENTAL) 400 MG CR tablet [id number]  TAKE  (1)  TABLET  THREE TIMES DAILY WITH MEALS. Newt Minion, MD  Active   senna-docusate (SENOKOT-S) 8.6-50 MG tablet [id number]  Take 1 tablet by mouth at bedtime. Deatra James, MD  Active             Patient Active Problem List   Diagnosis Date Noted   Syncope 02/19/2021   Acute GI bleeding 02/19/2021   Acute on chronic blood loss anemia 02/19/2021   Acute respiratory failure with hypoxia ([place]) 01/17/2021   Elevated MCV 01/17/2021   Leukocytosis 01/17/2021   CKD (chronic kidney disease), stage III ([place]) 01/17/2021   Hyperglycemia due to diabetes mellitus ([place]) 01/17/2021   Skin abscess 01/15/2020   Depression, recurrent ([place]) 08/08/2019   Cigarette smoker 06/19/2019   Vitamin D deficiency 01/29/2019   Dizziness 12/03/2018   Chronic combined systolic and diastolic CHF (congestive heart failure) ([place]) 12/03/2018   Coronary artery disease involving native coronary artery of native heart with angina pectoris ([place]) 10/04/2018   Hyperlipidemia 10/04/2018   Pain in right hand 10/02/2018   Myalgia due to statin 03/29/2018   Stage 3 severe COPD by GOLD classification ([place]) 10/26/2017    Tobacco abuse 03/07/2016   Type 2 diabetes mellitus ([place]) 08/26/2015   Osteoporosis 09/02/2014   Carotid stenosis 07/22/2012   PVD 07/02/2009   BLADDER CANCER 06/05/2009   Hyperlipidemia associated with type 2 diabetes mellitus ([place]) 06/05/2009   PSEUDOGOUT 06/05/2009  MITRAL VALVE PROLAPSE 06/05/2009    Immunization History  Administered Date(s) Administered   Fluad Quad(high Dose 65+) 06/14/2019, 07/22/2020   Influenza Inj Mdck Quad Pf 06/14/2019   Influenza, High Dose Seasonal PF 08/14/2017   Influenza,inj,Quad PF,6+ Mos 08/06/2013, 07/20/2014, 07/30/2015   Influenza-Unspecified 07/05/2018   Moderna Sars-Covid-2 Vaccination 11/06/2019, 12/05/2019, 06/16/2020   Pneumococcal Conjugate-13 09/09/2020   Pneumococcal Polysaccharide-23 08/08/2019   Tdap 03/17/2011   Zoster Recombinat (Shingrix) 11/14/2017, 04/19/2018   Zoster, Live 05/12/2010    Conditions to be addressed/monitored: HLD, COPD, and DMII  Care Plan : PHARMD MEDICATION MANAGEMENT  Updates made by Lavera Guise, [place] since 03/23/2021 12:00 AM     Problem: DISEASE PROGRESSION PREVENTION      Long-Range Goal: T2DM   This Visit's Progress: Not on track  Priority: High  Note:   Current Barriers:  Unable to independently afford treatment regimen Unable to achieve control of T2DM  Unable to maintain control of T2DM Suboptimal therapeutic regimen for T2DM   Pharmacist Clinical Goal(s):  Over the next 90 days, patient will verbalize ability to afford treatment regimen achieve control of T2DM as evidenced by LOWERING OF A1C achieve ability to self administer medications as prescribed through use of CAREGIVER HELP (SON & DAUGHTER TO [place] as evidenced by patient report through collaboration with PharmD and provider.    Interventions: 1:1 collaboration with Dettinger, Fransisca Kaufmann, MD regarding development and update of comprehensive plan of care as evidenced by provider attestation and  co-signature Inter-disciplinary care team collaboration (see longitudinal plan of care) Comprehensive medication review performed; medication list updated in electronic medical record  Diabetes: Uncontrolled; current treatment: TRESIBA, NOVOLOG;  Daughter/son oversee medications; patient can be forgetful when administering medications [phone] daughter's number) Will consider GLP1 moving forwards as son/daughter can administer it once weekly Current glucose readings: fasting glucose: 200-300, post prandial glucose: 300s Denies hypoglycemic/hyperglycemic symptoms Discussed meal planning options and Plate method for healthy eating Avoid sugary drinks and desserts Incorporate balanced protein, non starchy veggies, 1 serving of carbohydrate with each meal Increase water intake Increase physical activity as able Current exercise: N/A Counseled on medication administration; sliding scale insulin provided & printed out for patient Assessed patient finances. Patient is enrolled with health department for patient assistance; asked daughter to let us know if/when we need to apply for medications   Patient Goals/Self-Care Activities Over the next 90 days, patient will:  - take medications as prescribed focus on medication adherence by daughter/son helping to administer medications appropriately  Follow Up Plan: Telephone follow up appointment with care management team member scheduled for: 1 month      Medication Assistance:  PATIENT IS [place]; PATIENT'S DAUGHTER TO LET ME KNOW WHEN/IF CERTAIN MEDIATIONS ARE NOT BEING SUPPLIED ANY LONGER  Patient's preferred pharmacy is:  [place] [address] Phone: [phone] Fax: [phone]  Uses pill box? No - SON ASSISTS WITH MEDICATIONS Pt endorses UNSURE, NON COMPLIANCE DUE TO MEMORY  Follow Up:  Patient agrees to Care Plan and  Follow-up.  Plan: Telephone follow up appointment with care management team member scheduled for:  03/30/21 Regina Eck, PharmD, BCPS Clinical Pharmacist, [place]  II Phone [phone]

## 2021-03-22 ENCOUNTER — Ambulatory Visit: Payer: Medicare Other

## 2021-03-23 NOTE — Patient Instructions (Signed)
Visit Information  PATIENT GOALS:  Goals Addressed               This Visit's Progress     Patient Stated     T2DM (pt-stated)        Current Barriers:  Unable to independently afford treatment regimen Unable to achieve control of T2DM  Unable to maintain control of T2DM Suboptimal therapeutic regimen for T2DM   Pharmacist Clinical Goal(s):  Over the next 90 days, patient will verbalize ability to afford treatment regimen achieve control of T2DM as evidenced by LOWERING OF A1C achieve ability to self administer medications as prescribed through use of CAREGIVER HELP ([place] as evidenced by patient report through collaboration with PharmD and provider.    Interventions: 1:1 collaboration with Dettinger, Fransisca Kaufmann, MD regarding development and update of comprehensive plan of care as evidenced by provider attestation and co-signature Inter-disciplinary care team collaboration (see longitudinal plan of care) Comprehensive medication review performed; medication list updated in electronic medical record  Diabetes: Uncontrolled; current treatment: TRESIBA, NOVOLOG;  Daughter/son oversee medications; patient can be forgetful when administering medications [phone] daughter's number) Will consider GLP1 moving forwards as son/daughter can administer it once weekly Current glucose readings: fasting glucose: 200-300, post prandial glucose: 300s Denies hypoglycemic/hyperglycemic symptoms Discussed meal planning options and Plate method for healthy eating Avoid sugary drinks and desserts Incorporate balanced protein, non starchy veggies, 1 serving of carbohydrate with each meal Increase water intake Increase physical activity as able Current exercise: N/A Counseled on medication administration; sliding scale insulin provided & printed out for patient Assessed patient finances. Patient is enrolled with health department for patient  assistance; asked daughter to let us know if/when we need to apply for medications   Patient Goals/Self-Care Activities Over the next 90 days, patient will:  - take medications as prescribed focus on medication adherence by daughter/son helping to administer medications appropriately  Follow Up Plan: Telephone follow up appointment with care management team member scheduled for: 1 month          The patient verbalized understanding of instructions, educational materials, and care plan provided today and declined offer to receive copy of patient instructions, educational materials, and care plan.   Telephone follow up appointment with care management team member scheduled for:03/30/21  Signature Regina Eck, PharmD, BCPS Clinical Pharmacist, [place]  II Phone [phone]

## 2021-03-24 ENCOUNTER — Encounter: Payer: Self-pay | Admitting: *Deleted

## 2021-03-24 NOTE — Chronic Care Management (AMB) (Signed)
Chronic Care Management   CCM RN Visit Note  03/03/2021 Name: Hannah French MRN: [id number] DOB: 10/23/1940  Subjective: Hannah French is a 80 y.o. year old female who is a primary care patient of Dettinger, Fransisca Kaufmann, MD. The care management team was consulted for assistance with disease management and care coordination needs.    Engaged with patient by telephone for follow up visit in response to provider referral for case management and/or care coordination services.   Consent to Services:  The patient was given information about Chronic Care Management services, agreed to services, and gave verbal consent prior to initiation of services.  Please see initial visit note for detailed documentation.   Patient agreed to services and verbal consent obtained.   Assessment: Review of patient past medical history, allergies, medications, health status, including review of consultants reports, laboratory and other test data, was performed as part of comprehensive evaluation and provision of chronic care management services.   SDOH (Social Determinants of Health) assessments and interventions performed:    CCM Care Plan  Allergies  Allergen Reactions   Contrast Media [Iodinated Diagnostic Agents] Shortness Of Breath   Iohexol Other (See Comments)    PASSED OUT DURING THE TEST     Metformin Swelling    Face and hands became swollen   Statins Swelling and Other (See Comments)    They make the patient hurt "all over" Myalgia, also   Bextra [Valdecoxib] Nausea And Vomiting and Swelling    Shut down my kidneys    Cefdinir Other (See Comments)    Pancreatitis    Cozaar [Losartan Potassium] Other (See Comments)    dizziness   Gabapentin    Nsaids Other (See Comments)    GI intolerance and pain all over, tolerates ibu    Zetia [Ezetimibe] Swelling   Rofecoxib Other (See Comments)    "VIOXX"- "SHUT DOWN MY KIDNEYS"   Sulfa Antibiotics Nausea And Vomiting and Other (See  Comments)    Pancreatitis    Outpatient Encounter Medications as of 03/03/2021  Medication Sig Note   acetaminophen (TYLENOL) 500 MG tablet Take 500 mg by mouth every 6 (six) hours as needed for moderate pain.    albuterol (VENTOLIN HFA) 108 (90 Base) MCG/ACT inhaler Inhale 2 puffs into the lungs every 6 (six) hours as needed for wheezing or shortness of breath.    Cholecalciferol (VITAMIN D) 50 MCG (2000 UT) tablet Take 4,000 Units by mouth daily.     clopidogrel (PLAVIX) 75 MG tablet Take 1 tablet (75 mg total) by mouth daily.    ferrous sulfate 325 (65 FE) MG tablet Take 1 tablet (325 mg total) by mouth daily with breakfast.    Fluticasone-Umeclidin-Vilant (TRELEGY ELLIPTA) 100-62.5-25 MCG/INH AEPB Inhale 1 puff into the lungs daily. 03/23/2021: [employer] PATIENT ASSISTANCE   furosemide (LASIX) 20 MG tablet Take 2 tablets daily. You may take an extra dose for increase weight gain orswelling    glimepiride (AMARYL) 2 MG tablet Take 1-2 tablets (2-4 mg total) by mouth daily with breakfast.    insulin aspart (NOVOLOG) 100 UNIT/ML injection Inject 5-20 Units into the skin 3 (three) times daily before meals.    insulin degludec (TRESIBA FLEXTOUCH) 100 UNIT/ML FlexTouch Pen Inject 10 Units into the skin daily. 03/17/2021: 20 units    Naphazoline-Pheniramine (OPCON-A) 0.027-0.315 % SOLN Place 1 drop into both eyes daily as needed (Burning and itching eyes).    nitroGLYCERIN (NITRODUR - DOSED IN MG/24 HR) 0.2 mg/hr patch  Place 1 patch (0.2 mg total) onto the skin daily.    nitroGLYCERIN (NITROSTAT) 0.3 MG SL tablet Place 1 tablet (0.3 mg total) under the tongue every 5 (five) minutes as needed for chest pain.    pantoprazole (PROTONIX) 40 MG tablet Take 1 tablet (40 mg total) by mouth 2 (two) times daily for 20 days.    senna-docusate (SENOKOT-S) 8.6-50 MG tablet Take 1 tablet by mouth at bedtime.    [DISCONTINUED] Fluticasone-Umeclidin-Vilant (TRELEGY ELLIPTA) 100-62.5-25 MCG/INH AEPB Inhale 1 puff into the  lungs daily.    [DISCONTINUED] insulin lispro (HUMALOG) 100 UNIT/ML injection     [DISCONTINUED] levalbuterol (XOPENEX) 0.63 MG/3ML nebulizer solution Take 3 mLs (0.63 mg total) by nebulization every 4 (four) hours as needed for wheezing or shortness of breath.    [DISCONTINUED] metoprolol succinate (TOPROL-XL) 25 MG 24 hr tablet Take 1 tablet (25 mg total) by mouth daily.    [DISCONTINUED] pentoxifylline (TRENTAL) 400 MG CR tablet Take 1 tablet (400 mg total) by mouth 3 (three) times daily with meals.    No facility-administered encounter medications on file as of 03/03/2021.    Patient Active Problem List   Diagnosis Date Noted   Syncope 02/19/2021   Acute GI bleeding 02/19/2021   Acute on chronic blood loss anemia 02/19/2021   Acute respiratory failure with hypoxia ([place]) 01/17/2021   Elevated MCV 01/17/2021   Leukocytosis 01/17/2021   CKD (chronic kidney disease), stage III ([place]) 01/17/2021   Hyperglycemia due to diabetes mellitus ([place]) 01/17/2021   Skin abscess 01/15/2020   Depression, recurrent ([place]) 08/08/2019   Cigarette smoker 06/19/2019   Vitamin D deficiency 01/29/2019   Dizziness 12/03/2018   Chronic combined systolic and diastolic CHF (congestive heart failure) ([place]) 12/03/2018   Coronary artery disease involving native coronary artery of native heart with angina pectoris ([place]) 10/04/2018   Hyperlipidemia 10/04/2018   Pain in right hand 10/02/2018   Myalgia due to statin 03/29/2018   Stage 3 severe COPD by GOLD classification ([place]) 10/26/2017   Tobacco abuse 03/07/2016   Type 2 diabetes mellitus ([place]) 08/26/2015   Osteoporosis 09/02/2014   Carotid stenosis 07/22/2012   PVD 07/02/2009   BLADDER CANCER 06/05/2009   Hyperlipidemia associated with type 2 diabetes mellitus ([place]) 06/05/2009   PSEUDOGOUT 06/05/2009   MITRAL VALVE PROLAPSE 06/05/2009    Conditions to be addressed/monitored:CHF, HTN, and DMII  Care Plan : RNCM: Heart Failure (Adult)     Problem: Symptom  Exacerbation (Heart Failure)   Priority: Medium     Long-Range Goal: Symptom Exacerbation Prevented or Minimized   Start Date: 01/31/2021  This Visit's Progress: On track  Recent Progress: On track  Priority: Medium  Note:   Current Barriers:  Chronic Disease Management support and education needs related to CHF in a patient with CAD, COPD, DM, CKD III, and depression  Nurse Case Manager Clinical Goal(s):  patient will work with PCP to address needs related to medical management of CHF patient will meet with RN Care Manager to address self-management of CHF the patient will demonstrate ongoing self health care management ability as evidenced by checking and recording weight each morning after urinating and by calling cardiologist with any increase in weight of more than 3 pounds overnight of 5 pounds in one week*  Interventions:  1:1 collaboration with Claretta Fraise, MD regarding development and update of comprehensive plan of care as evidenced by provider attestation and co-signature Inter-disciplinary care team collaboration (see longitudinal plan of care) Chart reviewed including relevant office  notes, hospital notes, lab reports, and imaging reports Evaluation of current treatment plan related to CHF and patient's adherence to plan as established by provider. Reviewed and discussed medications and importance of compliance Performed health literacy screening Assessed for barriers to treatment plan Collaborated with LCSW and PharmD Provided verbal education on pathophysiology of CHF Advised to weigh each morning after urinating and without clothes on and to call cardiologist with any weight gain of more than 3lbs overnight or more than 5lbs in one week Provided verbal education on benefits of a low sodium/DASH diet Reviewed upcoming medical appointments Provided with RN Care Manager contact number and encouraged to reach out as needed  Self Care Activities:  Self administers  medications as prescribed Attends all scheduled provider appointments Calls pharmacy for medication refills Performs ADL's independently Calls provider office for new concerns or questions  Patient Goals Over the next 30 days, patient will: Continue to monitor and record weight each morning Call Cardiologist or PCP with any weight gain of more than 3 lbs overnight or 5 lbs in one week Keep all medical appointments Take medication as prescribed Follow a low sodium/DASH diet Call RN Care Manager as needed [phone]  Follow Up Plan:  Telephone follow up appointment with care management team member scheduled for: 04/22/21 with RNCM The patient has been provided with contact information for the care management team and has been advised to call with any health related questions or concerns.       Plan:Telephone follow up appointment with care management team member scheduled for:  04/22/21 with RNCM  Chong Sicilian, BSN, RN-BC [place] / [place] Management Direct Dial: [phone]

## 2021-03-24 NOTE — Patient Instructions (Signed)
Visit Information  PATIENT GOALS:  Goals Addressed             This Visit's Progress    Track and Manage Fluids and Swelling-Heart Failure   On track    Timeframe:  Long-Range Goal Priority:  High Start Date:    01/31/21                         Expected End Date:    09/24/21                   Follow Up Date 04/22/21   Continue to monitor and record weight each morning Call Cardiologist or PCP with any weight gain of more than 3 lbs overnight or 5 lbs in one week Keep all medical appointments Take medication as prescribed Follow a low sodium/DASH diet Call RN Care Manager as needed [phone]    Why is this important?   It is important to check your weight daily and watch how much salt and liquids you have.  It will help you to manage your heart failure.    Notes:          Patient verbalizes understanding of instructions provided today and agrees to view in [place].   Telephone follow up appointment with care management team member scheduled for: 04/22/21 with RNCM  Chong Sicilian, BSN, RN-BC [place] / [place] Management Direct Dial: [phone]

## 2021-03-25 ENCOUNTER — Telehealth: Payer: Self-pay | Admitting: Family Medicine

## 2021-03-25 NOTE — Telephone Encounter (Signed)
Appointment scheduled 1:55 with Dettinger on 7/8.

## 2021-03-30 ENCOUNTER — Ambulatory Visit: Payer: Medicare Other | Admitting: Pharmacist

## 2021-03-30 DIAGNOSIS — E1169 Type 2 diabetes mellitus with other specified complication: Secondary | ICD-10-CM

## 2021-03-31 ENCOUNTER — Telehealth (HOSPITAL_COMMUNITY): Payer: Self-pay | Admitting: Cardiology

## 2021-03-31 NOTE — Telephone Encounter (Signed)
  [phone] (M) LMOM   Pt reports her weight is normally 110, has increased to 118 over the past 2 weeks B/p 103/57 HR 97

## 2021-03-31 NOTE — Telephone Encounter (Signed)
Pt called to request work in [place] for weeks-has been taking lasix 60 daily Increased SOB, no home weights to report Wears ted hoses daily however unable to elevate through out the day/  F/u DB 8/22 has ff/u with pcp 7/8  Please advise further

## 2021-04-01 ENCOUNTER — Ambulatory Visit (INDEPENDENT_AMBULATORY_CARE_PROVIDER_SITE_OTHER): Payer: Medicare Other

## 2021-04-01 ENCOUNTER — Other Ambulatory Visit: Payer: Self-pay

## 2021-04-01 ENCOUNTER — Encounter: Payer: Self-pay | Admitting: Family Medicine

## 2021-04-01 ENCOUNTER — Ambulatory Visit (INDEPENDENT_AMBULATORY_CARE_PROVIDER_SITE_OTHER): Payer: Medicare Other | Admitting: Family Medicine

## 2021-04-01 VITALS — BP 103/53 | HR 105 | Ht 59.0 in | Wt 114.0 lb

## 2021-04-01 DIAGNOSIS — R0989 Other specified symptoms and signs involving the circulatory and respiratory systems: Secondary | ICD-10-CM

## 2021-04-01 DIAGNOSIS — R059 Cough, unspecified: Secondary | ICD-10-CM

## 2021-04-01 DIAGNOSIS — R609 Edema, unspecified: Secondary | ICD-10-CM | POA: Diagnosis not present

## 2021-04-01 MED ORDER — PREDNISONE 20 MG PO TABS: ORAL_TABLET | ORAL | 0 refills | Status: DC

## 2021-04-01 MED ORDER — BLOOD GLUCOSE TEST VI STRP: 1.0000 | ORAL_STRIP | Freq: Two times a day (BID) | 3 refills | Status: DC

## 2021-04-01 MED ORDER — BLOOD GLUCOSE TEST VI STRP: 1.0000 | ORAL_STRIP | Freq: Two times a day (BID) | 3 refills | Status: AC

## 2021-04-01 MED ORDER — FERROUS SULFATE 325 (65 FE) MG PO TABS: 325.0000 mg | ORAL_TABLET | Freq: Every day | ORAL | 1 refills | Status: DC

## 2021-04-01 NOTE — Addendum Note (Signed)
Addended by: Caryl Pina on: 04/01/2021 02:35 PM   Modules accepted: Orders

## 2021-04-01 NOTE — Progress Notes (Signed)
Chronic Care Management Pharmacy Note  03/30/2021 Name:  Hannah French MRN:  [id number] DOB:  1941/05/14  Summary: diabetes management  Recommendations/Changes made from today's visit: Diabetes: Uncontrolled; current treatment: TRESIBA, NOVOLOG;  Daughter/son oversee medications; patient can be forgetful when administering medications [phone] daughter's number) Will consider GLP1 moving forwards as son/daughter can administer it once weekly Attempting to get patient libre 2 CGM-submitted request to advanced diabetes via [employer] portal on 04/01/21 Patient was using health department for medication, however the stated they are no longer able to help  Plan: f/u in 2 weeks  Subjective: Hannah French is an 80 y.o. year old female who is a primary patient of Dettinger, Fransisca Kaufmann, MD.  The CCM team was consulted for assistance with disease management and care coordination needs.    Engaged with patient by telephone for follow up visit in response to provider referral for pharmacy case management and/or care coordination services.   Consent to Services:  The patient was given information about Chronic Care Management services, agreed to services, and gave verbal consent prior to initiation of services.  Please see initial visit note for detailed documentation.   Patient Care Team: Dettinger, Fransisca Kaufmann, MD as PCP - General (Family Medicine) Bensimhon, Shaune Pascal, MD as PCP - Cardiology (Cardiology) Bensimhon, Shaune Pascal, MD as Consulting Physician (Cardiology) Ilean China, RN as Case Manager , Royce Macadamia, [facility] as Pharmacist (Family Medicine)  Objective:  Lab Results  Component Value Date   CREATININE 1.38 (H) 02/28/2021   CREATININE 1.13 (H) 02/22/2021   CREATININE 1.29 (H) 02/20/2021    Lab Results  Component Value Date   HGBA1C 9.8 (H) 01/17/2021   Last diabetic Eye exam: No results found for: HMDIABEYEEXA  Last diabetic Foot exam: No results found  for: HMDIABFOOTEX      Component Value Date/Time   CHOL 157 02/17/2020 1159   TRIG 129 02/17/2020 1159   TRIG 82 06/09/2014 1028   HDL 41 02/17/2020 1159   HDL 57 06/09/2014 1028   CHOLHDL 3.8 02/17/2020 1159   CHOLHDL 3.5 02/06/2013 0932   VLDL 20 02/06/2013 0932   LDLCALC 93 02/17/2020 1159   LDLCALC 131 (H) 06/09/2014 1028    Hepatic Function Latest Ref Rng & Units 02/28/2021 02/22/2021 02/20/2021  Total Protein 6.0 - 8.5 g/dL 6.5 5.5(L) 4.8(L)  Albumin 3.7 - 4.7 g/dL 4.2 3.2(L) 2.8(L)  AST 0 - 40 IU/L _0 ALT 0 - 32 IU/L _1 Alk Phosphatase 44 - 121 IU/L 72 48 42  Total Bilirubin 0.0 - 1.2 mg/dL 0.4 1.4(H) 1.5(H)  Bilirubin, Direct 0.0 - 0.2 mg/dL - 0.1 -    Lab Results  Component Value Date/Time   TSH 2.540 06/09/2014 10:28 AM    CBC Latest Ref Rng & Units 02/28/2021 02/24/2021 02/22/2021  WBC 3.4 - 10.8 x10E3/uL 8.4 7.8 9.8  Hemoglobin 11.1 - 15.9 g/dL 8.6(LL) 8.1(LL) 8.2(L)  Hematocrit 34.0 - 46.6 % 26.3(L) 25.2(L) 27.1(L)  Platelets 150 - 450 x10E3/uL 312 241 198    Lab Results  Component Value Date/Time   VD25OH 103.0 (H) 02/17/2020 11:59 AM   VD25OH 54.9 08/08/2019 01:32 PM    Clinical ASCVD: No  The ASCVD Risk score Mikey Bussing DC Jr., et al., 2013) failed to calculate for the following reasons:   The 2013 ASCVD risk score is only valid for ages 66 to 49    Other: (CHADS2VASc if Afib, PHQ9 if depression, MMRC  or CAT for COPD, ACT, DEXA)  Social History   Tobacco Use  Smoking Status Every Day   Packs/day: 1.00   Years: 50.00   Pack years: 50.00   Types: Cigarettes  Smokeless Tobacco Never  Tobacco Comments   still smoking as of 03/01/21   BP Readings from Last 3 Encounters:  03/01/21 114/78  02/28/21 111/68  02/22/21 (!) 117/92   Pulse Readings from Last 3 Encounters:  03/01/21 (!) 101  02/28/21 (!) 113  02/22/21 (!) 110   Wt Readings from Last 3 Encounters:  03/01/21 115 lb (52.2 kg)  02/28/21 114 lb (51.7 kg)  02/22/21 117 lb 15.1 oz  (53.5 kg)    Assessment: Review of patient past medical history, allergies, medications, health status, including review of consultants reports, laboratory and other test data, was performed as part of comprehensive evaluation and provision of chronic care management services.   SDOH:  (Social Determinants of Health) assessments and interventions performed:    CCM Care Plan  Allergies  Allergen Reactions   Contrast Media [Iodinated Diagnostic Agents] Shortness Of Breath   Iohexol Other (See Comments)    PASSED OUT DURING THE TEST     Metformin Swelling    Face and hands became swollen   Statins Swelling and Other (See Comments)    They make the patient hurt "all over" Myalgia, also   Bextra [Valdecoxib] Nausea And Vomiting and Swelling    Shut down my kidneys    Cefdinir Other (See Comments)    Pancreatitis    Cozaar [Losartan Potassium] Other (See Comments)    dizziness   Gabapentin    Nsaids Other (See Comments)    GI intolerance and pain all over, tolerates ibu    Zetia [Ezetimibe] Swelling   Rofecoxib Other (See Comments)    "VIOXX"- "SHUT DOWN MY KIDNEYS"   Sulfa Antibiotics Nausea And Vomiting and Other (See Comments)    Pancreatitis    Medications Reviewed Today     Reviewed by Lavera Guise, [facility] (Pharmacist) on 04/01/21 at [phone]  Med List Status: <None>   Medication Order Taking? Sig Documenting Provider Last Dose Status Informant  acetaminophen (TYLENOL) 500 MG tablet [id number] No Take 500 mg by mouth every 6 (six) hours as needed for moderate pain. [provider] Taking Active Child  albuterol (VENTOLIN HFA) 108 (90 Base) MCG/ACT inhaler [id number] No Inhale 2 puffs into the lungs every 6 (six) hours as needed for wheezing or shortness of breath. Martyn Ehrich, NP Taking Active Child  Cholecalciferol (VITAMIN D) 50 MCG (2000 UT) tablet [id number] No Take 4,000 Units by mouth daily.  [provider] Taking Active Child           Med  Note Dimitri Ped, AMANDA L   Tue May 18, 2020  2:01 PM)    clopidogrel (PLAVIX) 75 MG tablet [id number] No Take 1 tablet (75 mg total) by mouth daily. Cheryln Manly, NP Taking Active Child  ferrous sulfate 325 (65 FE) MG tablet [id number] No Take 1 tablet (325 mg total) by mouth daily with breakfast. Deatra James, MD Taking Expired 03/25/21 2359   Fluticasone-Umeclidin-Vilant (TRELEGY ELLIPTA) 100-62.5-25 MCG/INH AEPB [id number] No Inhale 1 puff into the lungs daily. Martyn Ehrich, NP Taking Active Child           Med Note Leisa Lenz Mar 23, 2021 11:59 AM) [employer] PATIENT ASSISTANCE  furosemide (LASIX) 20 MG tablet [id number] No Take 2 tablets daily. You  may take an extra dose for increase weight gain orswelling Dettinger, Fransisca Kaufmann, MD Taking Active Child  glimepiride (AMARYL) 2 MG tablet [id number] No Take 1-2 tablets (2-4 mg total) by mouth daily with breakfast. Dettinger, Fransisca Kaufmann, MD Taking Active Child  insulin aspart (NOVOLOG) 100 UNIT/ML injection [id number] No Inject 5-20 Units into the skin 3 (three) times daily before meals. Dettinger, Fransisca Kaufmann, MD Taking Active Child  insulin degludec (TRESIBA FLEXTOUCH) 100 UNIT/ML FlexTouch Pen [id number] No Inject 10 Units into the skin daily. Dettinger, Fransisca Kaufmann, MD Taking Active Child           Med Note Parthenia Ames Mar 17, 2021  2:23 PM) 20 units   levalbuterol (XOPENEX) 0.63 MG/3ML nebulizer solution [id number]  NEBULIZE 1 VIAL EVERY 4 HOURS AS NEEDED FOR WHEEZING OR SHORTNESS OF Hulan Amato, MD  Active   metoprolol succinate (TOPROL-XL) 25 MG 24 hr tablet [id number]  TAKE 1 TABLET DAILY Bensimhon, Shaune Pascal, MD  Active   Naphazoline-Pheniramine (OPCON-A) 0.027-0.315 % SOLN [id number] No Place 1 drop into both eyes daily as needed (Burning and itching eyes). [provider] Taking Active Child  nitroGLYCERIN (NITRODUR - DOSED IN MG/24 HR) 0.2 mg/hr patch [id number] No Place 1 patch (0.2 mg total) onto the skin  daily. Newt Minion, MD Taking Active   nitroGLYCERIN (NITROSTAT) 0.3 MG SL tablet [id number] No Place 1 tablet (0.3 mg total) under the tongue every 5 (five) minutes as needed for chest pain. Dettinger, Fransisca Kaufmann, MD Taking Active Child  pantoprazole (PROTONIX) 40 MG tablet [id number] No Take 1 tablet (40 mg total) by mouth 2 (two) times daily for 20 days. Deatra James, MD Taking Active   pentoxifylline (TRENTAL) 400 MG CR tablet [id number]  TAKE  (1)  TABLET  THREE TIMES DAILY WITH MEALS. Newt Minion, MD  Active             Patient Active Problem List   Diagnosis Date Noted   Syncope 02/19/2021   Acute GI bleeding 02/19/2021   Acute on chronic blood loss anemia 02/19/2021   Acute respiratory failure with hypoxia ([place]) 01/17/2021   Elevated MCV 01/17/2021   Leukocytosis 01/17/2021   CKD (chronic kidney disease), stage III ([place]) 01/17/2021   Hyperglycemia due to diabetes mellitus ([place]) 01/17/2021   Skin abscess 01/15/2020   Depression, recurrent ([place]) 08/08/2019   Cigarette smoker 06/19/2019   Vitamin D deficiency 01/29/2019   Dizziness 12/03/2018   Chronic combined systolic and diastolic CHF (congestive heart failure) ([place]) 12/03/2018   Coronary artery disease involving native coronary artery of native heart with angina pectoris ([place]) 10/04/2018   Hyperlipidemia 10/04/2018   Pain in right hand 10/02/2018   Myalgia due to statin 03/29/2018   Stage 3 severe COPD by GOLD classification ([place]) 10/26/2017   Tobacco abuse 03/07/2016   Type 2 diabetes mellitus ([place]) 08/26/2015   Osteoporosis 09/02/2014   Carotid stenosis 07/22/2012   PVD 07/02/2009   BLADDER CANCER 06/05/2009   Hyperlipidemia associated with type 2 diabetes mellitus ([place]) 06/05/2009   PSEUDOGOUT 06/05/2009   MITRAL VALVE PROLAPSE 06/05/2009    Immunization History  Administered Date(s) Administered   Fluad Quad(high Dose 65+) 06/14/2019, 07/22/2020   Influenza Inj Mdck Quad Pf 06/14/2019   Influenza,  High Dose Seasonal PF 08/14/2017   Influenza,inj,Quad PF,6+ Mos 08/06/2013, 07/20/2014, 07/30/2015   Influenza-Unspecified 07/05/2018   Moderna Sars-Covid-2 Vaccination 11/06/2019, 12/05/2019, 06/16/2020   Pneumococcal Conjugate-13 09/09/2020   Pneumococcal Polysaccharide-23  08/08/2019   Tdap 03/17/2011   Zoster Recombinat (Shingrix) 11/14/2017, 04/19/2018   Zoster, Live 05/12/2010    Conditions to be addressed/monitored: CAD, HLD, COPD, and DMII  Care Plan : PHARMD MEDICATION MANAGEMENT  Updates made by Lavera Guise, [place] since 04/01/2021 12:00 AM     Problem: DISEASE PROGRESSION PREVENTION      Long-Range Goal: T2DM   Recent Progress: Not on track  Priority: High  Note:   Current Barriers:  Unable to independently afford treatment regimen Unable to achieve control of T2DM  Unable to maintain control of T2DM Suboptimal therapeutic regimen for T2DM   Pharmacist Clinical Goal(s):  Over the next 90 days, patient will verbalize ability to afford treatment regimen achieve control of T2DM as evidenced by LOWERING OF A1C achieve ability to self administer medications as prescribed through use of CAREGIVER HELP (SON & DAUGHTER TO [place] as evidenced by patient report through collaboration with PharmD and provider.    Interventions: 1:1 collaboration with Dettinger, Fransisca Kaufmann, MD regarding development and update of comprehensive plan of care as evidenced by provider attestation and co-signature Inter-disciplinary care team collaboration (see longitudinal plan of care) Comprehensive medication review performed; medication list updated in electronic medical record  Diabetes: Uncontrolled; current treatment: TRESIBA, NOVOLOG;  Daughter/son oversee medications; patient can be forgetful when administering medications [phone] daughter's number) Will consider GLP1 moving forwards as son/daughter can administer it once weekly Attempting to get  patient libre 2 CGM-submitted request to advanced diabetes via parach[employer] portal on 04/01/21 Patient was using health department for medication, however the stated they are no longer able to help Current glucose readings: fasting glucose: 200-300, post prandial glucose: 300s Denies hypoglycemic/hyperglycemic symptoms Discussed meal planning options and Plate method for healthy eating Avoid sugary drinks and desserts Incorporate balanced protein, non starchy veggies, 1 serving of carbohydrate with each meal Increase water intake Increase physical activity as able Current exercise: N/A Counseled on medication administration; sliding scale insulin provided & printed out for patient Assessed patient finances. Patient is enrolled with health department for patient assistance; asked daughter to let us know if/when we need to apply for medications   Patient Goals/Self-Care Activities Over the next 90 days, patient will:  - take medications as prescribed focus on medication adherence by daughter/son helping to administer medications appropriately  Follow Up Plan: Telephone follow up appointment with care management team member scheduled for: in 2 weeks      Medication Assistance:   PATIENT IS [place]; PATIENT'S DAUGHTER TO LET ME KNOW WHEN/IF CERTAIN MEDIATIONS ARE NOT BEING SUPPLIED ANY LONGER  Patient's preferred pharmacy is:  [facility], [address] [place] [address] Phone: [phone] Fax: [phone]  Uses pill box? No - children assist with medications    Follow Up:  Patient agrees to Care Plan and Follow-up.  Plan: Telephone follow up appointment with care management team member scheduled for:  2 weeks   Regina Eck, PharmD, BCPS Clinical Pharmacist, [place]  II Phone [phone]

## 2021-04-01 NOTE — Progress Notes (Signed)
BP (!) 103/53   Pulse (!) 105   Ht 4\' 11"  (1.499 m)   Wt 114 lb (51.7 kg)   BMI 23.03 kg/m    Subjective:   Patient ID: Hannah French, female    DOB: Jul 18, 1941, 80 y.o.   MRN: [id number]  HPI: Hannah French is a 80 y.o. female presenting on 04/01/2021 for Back Pain, Cough, and Leg Swelling (RLE)   HPI Back pain and coughing and congestion Patient has been complaining of increased back pain in her upper back and in the center just below her scapula that she gets especially worse with deep breaths or coughing.  She also has been having increased right leg and left leg swelling even though she is taking her diuretic Lasix 3 times a day.  Right leg swelling Patient says she has had increased leg swelling despite trying to keep her legs up sometimes at home and despite taking the furosemide 320 mg tablets a day.  She feels like everything has been swollen up more.  She denies any fevers or chills.  She does have cough and congestion and wheezing that is both worse in the evening but she does have it throughout the day.  She is still smoking and has been diagnosed with COPD but is also been diagnosed with congestive heart failure she is using her inhalers and they do help some.  Relevant past medical, surgical, family and social history reviewed and updated as indicated. Interim medical history since our last visit reviewed. Allergies and medications reviewed and updated.  Review of Systems  Constitutional:  Negative for chills and fever.  HENT:  Positive for congestion. Negative for ear discharge, ear pain, sinus pressure, sinus pain and sore throat.   Eyes:  Negative for redness and visual disturbance.  Respiratory:  Positive for cough, shortness of breath and wheezing. Negative for chest tightness.   Cardiovascular:  Positive for leg swelling. Negative for chest pain.  Musculoskeletal:  Positive for back pain.  Skin:  Negative for rash.  Neurological:  Negative for  light-headedness and headaches.  Psychiatric/Behavioral:  Negative for agitation and behavioral problems.   All other systems reviewed and are negative.  Per HPI unless specifically indicated above   Allergies as of 04/01/2021       Reactions   Contrast Media [iodinated Diagnostic Agents] Shortness Of Breath   Iohexol Other (See Comments)   PASSED OUT DURING THE TEST   Metformin Swelling   Face and hands became swollen   Statins Swelling, Other (See Comments)   They make the patient hurt "all over" Myalgia, also   Bextra [valdecoxib] Nausea And Vomiting, Swelling   Shut down my kidneys    Cefdinir Other (See Comments)   Pancreatitis   Cozaar [losartan Potassium] Other (See Comments)   dizziness   Gabapentin    Nsaids Other (See Comments)   GI intolerance and pain all over, tolerates ibu   Zetia [ezetimibe] Swelling   Rofecoxib Other (See Comments)   "VIOXX"- "SHUT DOWN MY KIDNEYS"   Sulfa Antibiotics Nausea And Vomiting, Other (See Comments)   Pancreatitis        Medication List        Accurate as of April 01, 2021  2:28 PM. If you have any questions, ask your nurse or doctor.          acetaminophen 500 MG tablet Commonly known as: TYLENOL Take 500 mg by mouth every 6 (six) hours as needed for moderate  pain.   albuterol 108 (90 Base) MCG/ACT inhaler Commonly known as: VENTOLIN HFA Inhale 2 puffs into the lungs every 6 (six) hours as needed for wheezing or shortness of breath.   clopidogrel 75 MG tablet Commonly known as: Plavix Take 1 tablet (75 mg total) by mouth daily.   ferrous sulfate 325 (65 FE) MG tablet Take 1 tablet (325 mg total) by mouth daily with breakfast.   furosemide 20 MG tablet Commonly known as: LASIX Take 2 tablets daily. You may take an extra dose for increase weight gain orswelling   glimepiride 2 MG tablet Commonly known as: AMARYL Take 1-2 tablets (2-4 mg total) by mouth daily with breakfast.   insulin aspart 100 UNIT/ML  injection Commonly known as: novoLOG Inject 5-20 Units into the skin 3 (three) times daily before meals.   levalbuterol 0.63 MG/3ML nebulizer solution Commonly known as: XOPENEX NEBULIZE 1 VIAL EVERY 4 HOURS AS NEEDED FOR WHEEZING OR SHORTNESS OF BREATH   metoprolol succinate 25 MG 24 hr tablet Commonly known as: TOPROL-XL TAKE 1 TABLET DAILY   nitroGLYCERIN 0.2 mg/hr patch Commonly known as: NITRODUR - Dosed in mg/24 hr Place 1 patch (0.2 mg total) onto the skin daily.   nitroGLYCERIN 0.3 MG SL tablet Commonly known as: Nitrostat Place 1 tablet (0.3 mg total) under the tongue every 5 (five) minutes as needed for chest pain.   Opcon-A 0.027-0.315 % Soln Generic drug: Naphazoline-Pheniramine Place 1 drop into both eyes daily as needed (Burning and itching eyes).   pantoprazole 40 MG tablet Commonly known as: PROTONIX Take 1 tablet (40 mg total) by mouth 2 (two) times daily for 20 days.   pentoxifylline 400 MG CR tablet Commonly known as: TRENTAL TAKE  (1)  TABLET  THREE TIMES DAILY WITH MEALS.   predniSONE 20 MG tablet Commonly known as: DELTASONE Take 3 tabs daily for 1 week, then 2 tabs daily for week 2, then 1 tab daily for week 3. Started by: Worthy Rancher, MD   Trelegy Cleaster Corin 100-62.5-25 MCG/INH Aepb Generic drug: Fluticasone-Umeclidin-Vilant Inhale 1 puff into the lungs daily.   Tyler Aas FlexTouch 100 UNIT/ML FlexTouch Pen Generic drug: insulin degludec Inject 10 Units into the skin daily.   Vitamin D 50 MCG (2000 UT) tablet Take 4,000 Units by mouth daily.         Objective:   BP (!) 103/53   Pulse (!) 105   Ht 4\' 11"  (1.499 m)   Wt 114 lb (51.7 kg)   BMI 23.03 kg/m   Wt Readings from Last 3 Encounters:  04/01/21 114 lb (51.7 kg)  03/01/21 115 lb (52.2 kg)  02/28/21 114 lb (51.7 kg)    Physical Exam Vitals and nursing note reviewed.  Constitutional:      General: She is not in acute distress.    Appearance: She is well-developed. She is  not diaphoretic.  Eyes:     Conjunctiva/sclera: Conjunctivae normal.  Cardiovascular:     Rate and Rhythm: Normal rate and regular rhythm.     Heart sounds: Normal heart sounds. No murmur heard. Pulmonary:     Effort: Pulmonary effort is normal. No respiratory distress.     Breath sounds: Wheezing present. No rhonchi or rales.  Chest:     Chest wall: No tenderness.  Musculoskeletal:        General: No tenderness. Normal range of motion.     Right lower leg: Edema present.     Left lower leg: Edema present.  Skin:  General: Skin is warm and dry.     Findings: No rash.  Neurological:     Mental Status: She is alert and oriented to person, place, and time.     Coordination: Coordination normal.  Psychiatric:        Behavior: Behavior normal.     Chest x-ray: No signs of pulmonary congestion or pneumonia, appears to be COPD changes, await final read from radiology Assessment & Plan:   Problem List Items Addressed This Visit   None Visit Diagnoses     Cough    -  Primary   Relevant Medications   predniSONE (DELTASONE) 20 MG tablet   Other Relevant Orders   DG Chest 2 View   Chest congestion       Relevant Medications   predniSONE (DELTASONE) 20 MG tablet   Other Relevant Orders   DG Chest 2 View   Peripheral edema       Relevant Medications   predniSONE (DELTASONE) 20 MG tablet   Other Relevant Orders   DG Chest 2 View       Likely COPD exacerbation, will do steroids, watch her sugars good while she is on it. Follow up plan: Return if symptoms worsen or fail to improve.  Counseling provided for all of the vaccine components Orders Placed This Encounter  Procedures   DG Chest [place], MD [facility] 04/01/2021, 2:28 PM

## 2021-04-01 NOTE — Patient Instructions (Signed)
Visit Information  PATIENT GOALS:  Goals Addressed               This Visit's Progress     Patient Stated     T2DM (pt-stated)        Current Barriers:  Unable to independently afford treatment regimen Unable to achieve control of T2DM  Unable to maintain control of T2DM Suboptimal therapeutic regimen for T2DM   Pharmacist Clinical Goal(s):  Over the next 90 days, patient will verbalize ability to afford treatment regimen achieve control of T2DM as evidenced by LOWERING OF A1C achieve ability to self administer medications as prescribed through use of [employer] ([place] as evidenced by patient report through collaboration with PharmD and provider.    Interventions: 1:1 collaboration with Dettinger, Fransisca Kaufmann, MD regarding development and update of comprehensive plan of care as evidenced by provider attestation and co-signature Inter-disciplinary care team collaboration (see longitudinal plan of care) Comprehensive medication review performed; medication list updated in electronic medical record  Diabetes: Uncontrolled; current treatment: TRESIBA, NOVOLOG;  Daughter/son oversee medications; patient can be forgetful when administering medications [phone] daughter's number) Will consider GLP1 moving forwards as son/daughter can administer it once weekly Attempting to get patient libre 2 CGM-submitted request to advanced diabetes via [facility] on 04/01/21 Current glucose readings: fasting glucose: 200-300, post prandial glucose: 300s Denies hypoglycemic/hyperglycemic symptoms Discussed meal planning options and Plate method for healthy eating Avoid sugary drinks and desserts Incorporate balanced protein, non starchy veggies, 1 serving of carbohydrate with each meal Increase water intake Increase physical activity as able Current exercise: N/A Counseled on medication administration; sliding scale insulin provided &  printed out for patient Assessed patient finances. Patient is enrolled with health department for patient assistance; asked daughter to let us know if/when we need to apply for medications   Patient Goals/Self-Care Activities Over the next 90 days, patient will:  - take medications as prescribed focus on medication adherence by daughter/son helping to administer medications appropriately  Follow Up Plan: Telephone follow up appointment with care management team member scheduled for: in 2 weeks          The patient verbalized understanding of instructions, educational materials, and care plan provided today and declined offer to receive copy of patient instructions, educational materials, and care plan.   Telephone follow up appointment with care management team member scheduled for: 2 weeks  Signature Regina Eck, PharmD, BCPS Clinical Pharmacist, [place]  [facility] Phone [phone]

## 2021-04-02 ENCOUNTER — Encounter: Payer: Self-pay | Admitting: Family Medicine

## 2021-04-02 LAB — CBC WITH DIFFERENTIAL/PLATELET
Basophils Absolute: 0.1 10*3/uL (ref 0.0–0.2)
Basos: 1 %
EOS (ABSOLUTE): 0.3 10*3/uL (ref 0.0–0.4)
Eos: 2 %
Hematocrit: 33.9 % — ABNORMAL LOW (ref 34.0–46.6)
Hemoglobin: 11.1 g/dL (ref 11.1–15.9)
Immature Grans (Abs): 0.1 10*3/uL (ref 0.0–0.1)
Immature Granulocytes: 1 %
Lymphocytes Absolute: 1.7 10*3/uL (ref 0.7–3.1)
Lymphs: 15 %
MCH: 31 pg (ref 26.6–33.0)
MCHC: 32.7 g/dL (ref 31.5–35.7)
MCV: 95 fL (ref 79–97)
Monocytes Absolute: 0.9 10*3/uL (ref 0.1–0.9)
Monocytes: 8 %
Neutrophils Absolute: 8.2 10*3/uL — ABNORMAL HIGH (ref 1.4–7.0)
Neutrophils: 73 %
Platelets: 448 10*3/uL (ref 150–450)
RBC: 3.58 x10E6/uL — ABNORMAL LOW (ref 3.77–5.28)
RDW: 21.1 % — ABNORMAL HIGH (ref 11.7–15.4)
WBC: 11.2 10*3/uL — ABNORMAL HIGH (ref 3.4–10.8)

## 2021-04-02 LAB — CMP14+EGFR
ALT: 10 IU/L (ref 0–32)
AST: 13 IU/L (ref 0–40)
Albumin/Globulin Ratio: 2.2 (ref 1.2–2.2)
Albumin: 4.4 g/dL (ref 3.7–4.7)
Alkaline Phosphatase: 64 IU/L (ref 44–121)
BUN/Creatinine Ratio: 19 (ref 12–28)
BUN: 29 mg/dL — ABNORMAL HIGH (ref 8–27)
Bilirubin Total: 0.6 mg/dL (ref 0.0–1.2)
CO2: 27 mmol/L (ref 20–29)
Calcium: 9.6 mg/dL (ref 8.7–10.3)
Chloride: 97 mmol/L (ref 96–106)
Creatinine, Ser: 1.53 mg/dL — ABNORMAL HIGH (ref 0.57–1.00)
Globulin, Total: 2 g/dL (ref 1.5–4.5)
Glucose: 151 mg/dL — ABNORMAL HIGH (ref 65–99)
Potassium: 4.5 mmol/L (ref 3.5–5.2)
Sodium: 140 mmol/L (ref 134–144)
Total Protein: 6.4 g/dL (ref 6.0–8.5)
eGFR: 34 mL/min/{1.73_m2} — ABNORMAL LOW (ref >59)

## 2021-04-04 ENCOUNTER — Telehealth (HOSPITAL_COMMUNITY): Payer: Self-pay | Admitting: *Deleted

## 2021-04-04 ENCOUNTER — Ambulatory Visit: Payer: Medicare Other | Admitting: Family Medicine

## 2021-04-04 ENCOUNTER — Encounter (HOSPITAL_COMMUNITY): Payer: Self-pay

## 2021-04-04 MED ORDER — METOLAZONE 2.5 MG PO TABS: 2.5000 mg | ORAL_TABLET | ORAL | 0 refills | Status: DC

## 2021-04-04 MED ORDER — POTASSIUM CHLORIDE CRYS ER 20 MEQ PO TBCR: 40.0000 meq | EXTENDED_RELEASE_TABLET | ORAL | 0 refills | Status: DC

## 2021-04-04 MED ORDER — SENNA-DOCUSATE SODIUM 8.6-50 MG PO TABS: 1.0000 | ORAL_TABLET | Freq: Every day | ORAL | 5 refills | Status: AC

## 2021-04-04 NOTE — Telephone Encounter (Signed)
Pt's daughter Heriberto [place] in for echo today and stopped by to discuss pt. She states her wt is up about 3-4 lbs, she has LE edema, and increased SOB, pt is on Lasix 40 mg Daily and increased to 60 mg Daily last week with no results. Pt saw PCP 7/8, labs stable, chest x-ray with pulm edema.  Discussed with Dr Haroldine Laws, he advised to give pt Metolazone 2.5 mg along with KCL 40 meq for 2 days   Attempted to call pt's daughter back and got her VM, left detailed message about medications, mychart message also sent with info

## 2021-04-06 ENCOUNTER — Emergency Department (HOSPITAL_COMMUNITY): Payer: Medicare Other

## 2021-04-06 ENCOUNTER — Inpatient Hospital Stay (HOSPITAL_COMMUNITY): Payer: Medicare Other

## 2021-04-06 ENCOUNTER — Encounter (HOSPITAL_COMMUNITY): Payer: Self-pay | Admitting: Emergency Medicine

## 2021-04-06 ENCOUNTER — Other Ambulatory Visit: Payer: Self-pay

## 2021-04-06 ENCOUNTER — Inpatient Hospital Stay (HOSPITAL_COMMUNITY)
Admission: EM | Admit: 2021-04-06 | Discharge: 2021-04-12 | DRG: 208 | Disposition: A | Discharge status: Home Health | Payer: Medicare Other | Attending: Family Medicine | Admitting: Family Medicine

## 2021-04-06 DIAGNOSIS — N170 Acute kidney failure with tubular necrosis: Secondary | ICD-10-CM | POA: Diagnosis present

## 2021-04-06 DIAGNOSIS — R54 Age-related physical debility: Secondary | ICD-10-CM | POA: Diagnosis present

## 2021-04-06 DIAGNOSIS — Z20822 Contact with and (suspected) exposure to covid-19: Secondary | ICD-10-CM | POA: Diagnosis not present

## 2021-04-06 DIAGNOSIS — I272 Pulmonary hypertension, unspecified: Secondary | ICD-10-CM | POA: Diagnosis not present

## 2021-04-06 DIAGNOSIS — R0902 Hypoxemia: Secondary | ICD-10-CM

## 2021-04-06 DIAGNOSIS — J9621 Acute and chronic respiratory failure with hypoxia: Secondary | ICD-10-CM | POA: Diagnosis present

## 2021-04-06 DIAGNOSIS — E1169 Type 2 diabetes mellitus with other specified complication: Secondary | ICD-10-CM | POA: Diagnosis not present

## 2021-04-06 DIAGNOSIS — J189 Pneumonia, unspecified organism: Secondary | ICD-10-CM | POA: Diagnosis present

## 2021-04-06 DIAGNOSIS — Z888 Allergy status to other drugs, medicaments and biological substances status: Secondary | ICD-10-CM

## 2021-04-06 DIAGNOSIS — Z8679 Personal history of other diseases of the circulatory system: Secondary | ICD-10-CM

## 2021-04-06 DIAGNOSIS — R531 Weakness: Secondary | ICD-10-CM | POA: Diagnosis not present

## 2021-04-06 DIAGNOSIS — N1831 Chronic kidney disease, stage 3a: Secondary | ICD-10-CM | POA: Diagnosis not present

## 2021-04-06 DIAGNOSIS — R6520 Severe sepsis without septic shock: Secondary | ICD-10-CM

## 2021-04-06 DIAGNOSIS — K219 Gastro-esophageal reflux disease without esophagitis: Secondary | ICD-10-CM | POA: Diagnosis present

## 2021-04-06 DIAGNOSIS — N179 Acute kidney failure, unspecified: Secondary | ICD-10-CM

## 2021-04-06 DIAGNOSIS — R739 Hyperglycemia, unspecified: Secondary | ICD-10-CM | POA: Diagnosis not present

## 2021-04-06 DIAGNOSIS — I2510 Atherosclerotic heart disease of native coronary artery without angina pectoris: Secondary | ICD-10-CM | POA: Diagnosis not present

## 2021-04-06 DIAGNOSIS — E785 Hyperlipidemia, unspecified: Secondary | ICD-10-CM | POA: Diagnosis present

## 2021-04-06 DIAGNOSIS — E876 Hypokalemia: Secondary | ICD-10-CM | POA: Diagnosis not present

## 2021-04-06 DIAGNOSIS — J9622 Acute and chronic respiratory failure with hypercapnia: Secondary | ICD-10-CM | POA: Diagnosis not present

## 2021-04-06 DIAGNOSIS — F17210 Nicotine dependence, cigarettes, uncomplicated: Secondary | ICD-10-CM | POA: Diagnosis present

## 2021-04-06 DIAGNOSIS — Z452 Encounter for adjustment and management of vascular access device: Secondary | ICD-10-CM | POA: Diagnosis not present

## 2021-04-06 DIAGNOSIS — J9612 Chronic respiratory failure with hypercapnia: Secondary | ICD-10-CM | POA: Diagnosis present

## 2021-04-06 DIAGNOSIS — Z955 Presence of coronary angioplasty implant and graft: Secondary | ICD-10-CM

## 2021-04-06 DIAGNOSIS — J9600 Acute respiratory failure, unspecified whether with hypoxia or hypercapnia: Secondary | ICD-10-CM | POA: Diagnosis present

## 2021-04-06 DIAGNOSIS — N289 Disorder of kidney and ureter, unspecified: Secondary | ICD-10-CM | POA: Diagnosis not present

## 2021-04-06 DIAGNOSIS — J9601 Acute respiratory failure with hypoxia: Secondary | ICD-10-CM | POA: Diagnosis not present

## 2021-04-06 DIAGNOSIS — Z743 Need for continuous supervision: Secondary | ICD-10-CM | POA: Diagnosis not present

## 2021-04-06 DIAGNOSIS — E11649 Type 2 diabetes mellitus with hypoglycemia without coma: Secondary | ICD-10-CM | POA: Diagnosis not present

## 2021-04-06 DIAGNOSIS — R410 Disorientation, unspecified: Secondary | ICD-10-CM | POA: Diagnosis not present

## 2021-04-06 DIAGNOSIS — I130 Hypertensive heart and chronic kidney disease with heart failure and stage 1 through stage 4 chronic kidney disease, or unspecified chronic kidney disease: Secondary | ICD-10-CM | POA: Diagnosis present

## 2021-04-06 DIAGNOSIS — I5031 Acute diastolic (congestive) heart failure: Secondary | ICD-10-CM | POA: Diagnosis not present

## 2021-04-06 DIAGNOSIS — I5023 Acute on chronic systolic (congestive) heart failure: Secondary | ICD-10-CM | POA: Diagnosis not present

## 2021-04-06 DIAGNOSIS — M810 Age-related osteoporosis without current pathological fracture: Secondary | ICD-10-CM | POA: Diagnosis present

## 2021-04-06 DIAGNOSIS — J440 Chronic obstructive pulmonary disease with acute lower respiratory infection: Secondary | ICD-10-CM | POA: Diagnosis not present

## 2021-04-06 DIAGNOSIS — J9690 Respiratory failure, unspecified, unspecified whether with hypoxia or hypercapnia: Secondary | ICD-10-CM | POA: Diagnosis not present

## 2021-04-06 DIAGNOSIS — J9611 Chronic respiratory failure with hypoxia: Secondary | ICD-10-CM | POA: Diagnosis present

## 2021-04-06 DIAGNOSIS — E1165 Type 2 diabetes mellitus with hyperglycemia: Secondary | ICD-10-CM | POA: Diagnosis present

## 2021-04-06 DIAGNOSIS — J9602 Acute respiratory failure with hypercapnia: Secondary | ICD-10-CM | POA: Diagnosis not present

## 2021-04-06 DIAGNOSIS — E874 Mixed disorder of acid-base balance: Secondary | ICD-10-CM | POA: Diagnosis not present

## 2021-04-06 DIAGNOSIS — E1122 Type 2 diabetes mellitus with diabetic chronic kidney disease: Secondary | ICD-10-CM | POA: Diagnosis not present

## 2021-04-06 DIAGNOSIS — A419 Sepsis, unspecified organism: Secondary | ICD-10-CM

## 2021-04-06 DIAGNOSIS — R0602 Shortness of breath: Secondary | ICD-10-CM | POA: Diagnosis not present

## 2021-04-06 DIAGNOSIS — E1151 Type 2 diabetes mellitus with diabetic peripheral angiopathy without gangrene: Secondary | ICD-10-CM | POA: Diagnosis not present

## 2021-04-06 DIAGNOSIS — R000 Tachycardia, unspecified: Secondary | ICD-10-CM | POA: Diagnosis not present

## 2021-04-06 DIAGNOSIS — J441 Chronic obstructive pulmonary disease with (acute) exacerbation: Secondary | ICD-10-CM | POA: Diagnosis not present

## 2021-04-06 DIAGNOSIS — Z79899 Other long term (current) drug therapy: Secondary | ICD-10-CM

## 2021-04-06 DIAGNOSIS — T380X5A Adverse effect of glucocorticoids and synthetic analogues, initial encounter: Secondary | ICD-10-CM | POA: Diagnosis present

## 2021-04-06 DIAGNOSIS — I428 Other cardiomyopathies: Secondary | ICD-10-CM | POA: Diagnosis present

## 2021-04-06 DIAGNOSIS — R069 Unspecified abnormalities of breathing: Secondary | ICD-10-CM | POA: Diagnosis not present

## 2021-04-06 DIAGNOSIS — I5043 Acute on chronic combined systolic (congestive) and diastolic (congestive) heart failure: Secondary | ICD-10-CM | POA: Diagnosis not present

## 2021-04-06 DIAGNOSIS — Z8249 Family history of ischemic heart disease and other diseases of the circulatory system: Secondary | ICD-10-CM

## 2021-04-06 DIAGNOSIS — Z7902 Long term (current) use of antithrombotics/antiplatelets: Secondary | ICD-10-CM

## 2021-04-06 DIAGNOSIS — Z8551 Personal history of malignant neoplasm of bladder: Secondary | ICD-10-CM

## 2021-04-06 DIAGNOSIS — R6889 Other general symptoms and signs: Secondary | ICD-10-CM | POA: Diagnosis not present

## 2021-04-06 DIAGNOSIS — Z7951 Long term (current) use of inhaled steroids: Secondary | ICD-10-CM

## 2021-04-06 DIAGNOSIS — Z90710 Acquired absence of both cervix and uterus: Secondary | ICD-10-CM

## 2021-04-06 DIAGNOSIS — Z794 Long term (current) use of insulin: Secondary | ICD-10-CM

## 2021-04-06 DIAGNOSIS — Z4682 Encounter for fitting and adjustment of non-vascular catheter: Secondary | ICD-10-CM | POA: Diagnosis not present

## 2021-04-06 DIAGNOSIS — M1120 Other chondrocalcinosis, unspecified site: Secondary | ICD-10-CM | POA: Diagnosis present

## 2021-04-06 DIAGNOSIS — Z882 Allergy status to sulfonamides status: Secondary | ICD-10-CM

## 2021-04-06 DIAGNOSIS — Z825 Family history of asthma and other chronic lower respiratory diseases: Secondary | ICD-10-CM

## 2021-04-06 DIAGNOSIS — Z91041 Radiographic dye allergy status: Secondary | ICD-10-CM

## 2021-04-06 DIAGNOSIS — I499 Cardiac arrhythmia, unspecified: Secondary | ICD-10-CM | POA: Diagnosis not present

## 2021-04-06 DIAGNOSIS — J449 Chronic obstructive pulmonary disease, unspecified: Secondary | ICD-10-CM | POA: Diagnosis not present

## 2021-04-06 DIAGNOSIS — I700 Atherosclerosis of aorta: Secondary | ICD-10-CM | POA: Diagnosis not present

## 2021-04-06 LAB — BLOOD GAS, ARTERIAL
Acid-base deficit: 7 mmol/L — ABNORMAL HIGH (ref 0.0–2.0)
Acid-base deficit: 4 mmol/L — ABNORMAL HIGH (ref 0.0–2.0)
Bicarbonate: 16.9 mmol/L — ABNORMAL LOW (ref 20.0–28.0)
Bicarbonate: 20.1 mmol/L (ref 20.0–28.0)
FIO2: 60
FIO2: 40
O2 Saturation: 97.7 %
O2 Saturation: 94.6 %
Patient temperature: 35.4
Patient temperature: 35.9
pCO2 arterial: 74.8 mmHg (ref 32.0–48.0)
pCO2 arterial: 55.9 mmHg — ABNORMAL HIGH (ref 32.0–48.0)
pH, Arterial: 7.083 — CL (ref 7.350–7.450)
pH, Arterial: 7.226 — ABNORMAL LOW (ref 7.350–7.450)
pO2, Arterial: 166 mmHg — ABNORMAL HIGH (ref 83.0–108.0)
pO2, Arterial: 85.7 mmHg (ref 83.0–108.0)

## 2021-04-06 LAB — POCT I-STAT 7, (LYTES, BLD GAS, ICA,H+H)
Acid-base deficit: 2 mmol/L (ref 0.0–2.0)
Bicarbonate: 25.9 mmol/L (ref 20.0–28.0)
Calcium, Ion: 1.28 mmol/L (ref 1.15–1.40)
HCT: 35 % — ABNORMAL LOW (ref 36.0–46.0)
Hemoglobin: 11.9 g/dL — ABNORMAL LOW (ref 12.0–15.0)
O2 Saturation: 96 %
Patient temperature: 36.5
Potassium: 3.5 mmol/L (ref 3.5–5.1)
Sodium: 139 mmol/L (ref 135–145)
TCO2: 28 mmol/L (ref 22–32)
pCO2 arterial: 54.1 mmHg — ABNORMAL HIGH (ref 32.0–48.0)
pH, Arterial: 7.286 — ABNORMAL LOW (ref 7.350–7.450)
pO2, Arterial: 92 mmHg (ref 83.0–108.0)

## 2021-04-06 LAB — CBC WITH DIFFERENTIAL/PLATELET
Abs Immature Granulocytes: 0.42 10*3/uL — ABNORMAL HIGH (ref 0.00–0.07)
Basophils Absolute: 0.1 10*3/uL (ref 0.0–0.1)
Basophils Relative: 1 %
Eosinophils Absolute: 0 10*3/uL (ref 0.0–0.5)
Eosinophils Relative: 0 %
HCT: 40.2 % (ref 36.0–46.0)
Hemoglobin: 11.9 g/dL — ABNORMAL LOW (ref 12.0–15.0)
Immature Granulocytes: 3 %
Lymphocytes Relative: 12 %
Lymphs Abs: 2 10*3/uL (ref 0.7–4.0)
MCH: 31.7 pg (ref 26.0–34.0)
MCHC: 29.6 g/dL — ABNORMAL LOW (ref 30.0–36.0)
MCV: 107.2 fL — ABNORMAL HIGH (ref 80.0–100.0)
Monocytes Absolute: 1.3 10*3/uL — ABNORMAL HIGH (ref 0.1–1.0)
Monocytes Relative: 8 %
Neutro Abs: 12.3 10*3/uL — ABNORMAL HIGH (ref 1.7–7.7)
Neutrophils Relative %: 76 %
Platelets: 651 10*3/uL — ABNORMAL HIGH (ref 150–400)
RBC: 3.75 MIL/uL — ABNORMAL LOW (ref 3.87–5.11)
RDW: 24 % — ABNORMAL HIGH (ref 11.5–15.5)
WBC: 16.1 10*3/uL — ABNORMAL HIGH (ref 4.0–10.5)
nRBC: 1.7 % — ABNORMAL HIGH (ref 0.0–0.2)

## 2021-04-06 LAB — COMPREHENSIVE METABOLIC PANEL
ALT: 68 U/L — ABNORMAL HIGH (ref 0–44)
AST: 53 U/L — ABNORMAL HIGH (ref 15–41)
Albumin: 3.6 g/dL (ref 3.5–5.0)
Alkaline Phosphatase: 42 U/L (ref 38–126)
Anion gap: 10 (ref 5–15)
BUN: 60 mg/dL — ABNORMAL HIGH (ref 8–23)
CO2: 27 mmol/L (ref 22–32)
Calcium: 9 mg/dL (ref 8.9–10.3)
Chloride: 103 mmol/L (ref 98–111)
Creatinine, Ser: 2.38 mg/dL — ABNORMAL HIGH (ref 0.44–1.00)
GFR, Estimated: 20 mL/min — ABNORMAL LOW (ref >60)
Glucose, Bld: 148 mg/dL — ABNORMAL HIGH (ref 70–99)
Potassium: 3.7 mmol/L (ref 3.5–5.1)
Sodium: 140 mmol/L (ref 135–145)
Total Bilirubin: 0.8 mg/dL (ref 0.3–1.2)
Total Protein: 5.8 g/dL — ABNORMAL LOW (ref 6.5–8.1)

## 2021-04-06 LAB — CBG MONITORING, ED
Glucose-Capillary: 541 mg/dL (ref 70–99)
Glucose-Capillary: 574 mg/dL (ref 70–99)
Glucose-Capillary: 539 mg/dL (ref 70–99)
Glucose-Capillary: 464 mg/dL — ABNORMAL HIGH (ref 70–99)
Glucose-Capillary: 505 mg/dL (ref 70–99)
Glucose-Capillary: 528 mg/dL (ref 70–99)
Glucose-Capillary: 451 mg/dL — ABNORMAL HIGH (ref 70–99)
Glucose-Capillary: 338 mg/dL — ABNORMAL HIGH (ref 70–99)
Glucose-Capillary: 393 mg/dL — ABNORMAL HIGH (ref 70–99)

## 2021-04-06 LAB — URINALYSIS, ROUTINE W REFLEX MICROSCOPIC
Bacteria, UA: NONE SEEN
Bilirubin Urine: NEGATIVE
Glucose, UA: 150 mg/dL — AB
Ketones, ur: NEGATIVE mg/dL
Leukocytes,Ua: NEGATIVE
Nitrite: NEGATIVE
Protein, ur: NEGATIVE mg/dL
Specific Gravity, Urine: 1.01 (ref 1.005–1.030)
pH: 5 (ref 5.0–8.0)

## 2021-04-06 LAB — COOXEMETRY PANEL
Carboxyhemoglobin: 1.4 % (ref 0.5–1.5)
Methemoglobin: 1.2 % (ref 0.0–1.5)
O2 Saturation: 73.1 %
Total hemoglobin: 10.2 g/dL — ABNORMAL LOW (ref 12.0–16.0)

## 2021-04-06 LAB — HEMOGLOBIN A1C
Hgb A1c MFr Bld: 8.1 % — ABNORMAL HIGH (ref 4.8–5.6)
Mean Plasma Glucose: 185.77 mg/dL

## 2021-04-06 LAB — CBC
HCT: 34.1 % — ABNORMAL LOW (ref 36.0–46.0)
HCT: 32.5 % — ABNORMAL LOW (ref 36.0–46.0)
Hemoglobin: 10.1 g/dL — ABNORMAL LOW (ref 12.0–15.0)
Hemoglobin: 9.9 g/dL — ABNORMAL LOW (ref 12.0–15.0)
MCH: 31.6 pg (ref 26.0–34.0)
MCH: 31.4 pg (ref 26.0–34.0)
MCHC: 29.6 g/dL — ABNORMAL LOW (ref 30.0–36.0)
MCHC: 30.5 g/dL (ref 30.0–36.0)
MCV: 106.6 fL — ABNORMAL HIGH (ref 80.0–100.0)
MCV: 103.2 fL — ABNORMAL HIGH (ref 80.0–100.0)
Platelets: 455 10*3/uL — ABNORMAL HIGH (ref 150–400)
Platelets: 436 10*3/uL — ABNORMAL HIGH (ref 150–400)
RBC: 3.2 MIL/uL — ABNORMAL LOW (ref 3.87–5.11)
RBC: 3.15 MIL/uL — ABNORMAL LOW (ref 3.87–5.11)
RDW: 23.8 % — ABNORMAL HIGH (ref 11.5–15.5)
RDW: 22.9 % — ABNORMAL HIGH (ref 11.5–15.5)
WBC: 22.4 10*3/uL — ABNORMAL HIGH (ref 4.0–10.5)
WBC: 20.6 10*3/uL — ABNORMAL HIGH (ref 4.0–10.5)
nRBC: 0.7 % — ABNORMAL HIGH (ref 0.0–0.2)
nRBC: 0.5 % — ABNORMAL HIGH (ref 0.0–0.2)

## 2021-04-06 LAB — ECHOCARDIOGRAM COMPLETE
AR max vel: 2.14 cm2
AV Peak grad: 8.6 mmHg
Ao pk vel: 1.47 m/s
Area-P 1/2: 5.97 cm2
Height: 59 in
S' Lateral: 3.8 cm
Weight: 1798.95 oz

## 2021-04-06 LAB — PHOSPHORUS: Phosphorus: 4.2 mg/dL (ref 2.5–4.6)

## 2021-04-06 LAB — BASIC METABOLIC PANEL
Anion gap: 11 (ref 5–15)
BUN: 56 mg/dL — ABNORMAL HIGH (ref 8–23)
CO2: 27 mmol/L (ref 22–32)
Calcium: 8.7 mg/dL — ABNORMAL LOW (ref 8.9–10.3)
Chloride: 98 mmol/L (ref 98–111)
Creatinine, Ser: 2.13 mg/dL — ABNORMAL HIGH (ref 0.44–1.00)
GFR, Estimated: 23 mL/min — ABNORMAL LOW (ref >60)
Glucose, Bld: 496 mg/dL — ABNORMAL HIGH (ref 70–99)
Potassium: 4.9 mmol/L (ref 3.5–5.1)
Sodium: 136 mmol/L (ref 135–145)

## 2021-04-06 LAB — CREATININE, SERUM
Creatinine, Ser: 2.37 mg/dL — ABNORMAL HIGH (ref 0.44–1.00)
GFR, Estimated: 20 mL/min — ABNORMAL LOW (ref >60)

## 2021-04-06 LAB — LACTIC ACID, PLASMA
Lactic Acid, Venous: 4.2 mmol/L (ref 0.5–1.9)
Lactic Acid, Venous: 3.3 mmol/L (ref 0.5–1.9)
Lactic Acid, Venous: 4.7 mmol/L (ref 0.5–1.9)
Lactic Acid, Venous: 2.2 mmol/L (ref 0.5–1.9)

## 2021-04-06 LAB — GLUCOSE, CAPILLARY
Glucose-Capillary: 131 mg/dL — ABNORMAL HIGH (ref 70–99)
Glucose-Capillary: 132 mg/dL — ABNORMAL HIGH (ref 70–99)
Glucose-Capillary: 171 mg/dL — ABNORMAL HIGH (ref 70–99)
Glucose-Capillary: 172 mg/dL — ABNORMAL HIGH (ref 70–99)
Glucose-Capillary: 191 mg/dL — ABNORMAL HIGH (ref 70–99)
Glucose-Capillary: 97 mg/dL (ref 70–99)

## 2021-04-06 LAB — PROCALCITONIN
Procalcitonin: <0.1 ng/mL
Procalcitonin: 1.05 ng/mL

## 2021-04-06 LAB — RESP PANEL BY RT-PCR (FLU A&B, COVID) ARPGX2
Influenza A by PCR: NEGATIVE
Influenza B by PCR: NEGATIVE
SARS Coronavirus 2 by RT PCR: NEGATIVE

## 2021-04-06 LAB — STREP PNEUMONIAE URINARY ANTIGEN: Strep Pneumo Urinary Antigen: NEGATIVE

## 2021-04-06 LAB — MRSA NEXT GEN BY PCR, NASAL: MRSA by PCR Next Gen: NOT DETECTED

## 2021-04-06 LAB — TROPONIN I (HIGH SENSITIVITY)
Troponin I (High Sensitivity): 14 ng/L (ref <18)
Troponin I (High Sensitivity): 16 ng/L (ref <18)

## 2021-04-06 LAB — PROTIME-INR
INR: 1.2 (ref 0.8–1.2)
Prothrombin Time: 15.4 seconds — ABNORMAL HIGH (ref 11.4–15.2)

## 2021-04-06 LAB — TRIGLYCERIDES: Triglycerides: 171 mg/dL — ABNORMAL HIGH (ref <150)

## 2021-04-06 LAB — CORTISOL: Cortisol, Plasma: 4 ug/dL

## 2021-04-06 LAB — BRAIN NATRIURETIC PEPTIDE: B Natriuretic Peptide: 918 pg/mL — ABNORMAL HIGH (ref 0.0–100.0)

## 2021-04-06 LAB — AMYLASE: Amylase: 57 U/L (ref 28–100)

## 2021-04-06 LAB — LIPASE, BLOOD: Lipase: 29 U/L (ref 11–51)

## 2021-04-06 LAB — MAGNESIUM: Magnesium: 2.4 mg/dL (ref 1.7–2.4)

## 2021-04-06 LAB — APTT: aPTT: 24 seconds (ref 24–36)

## 2021-04-06 MED ORDER — ACETAMINOPHEN 650 MG RE SUPP: 650.0000 mg | Freq: Four times a day (QID) | RECTAL | Status: DC | PRN

## 2021-04-06 MED ORDER — CHLORHEXIDINE GLUCONATE CLOTH 2 % EX PADS
6.0000 | MEDICATED_PAD | Freq: Every day | CUTANEOUS | Status: DC
  Administered 2021-04-07 – 2021-04-08 (×2): 6 via TOPICAL

## 2021-04-06 MED ORDER — LORAZEPAM 2 MG/ML IJ SOLN: 1.0000 mg | Freq: Once | INTRAMUSCULAR | Status: DC

## 2021-04-06 MED ORDER — CHLORHEXIDINE GLUCONATE CLOTH 2 % EX PADS
6.0000 | MEDICATED_PAD | Freq: Every day | CUTANEOUS | Status: DC
  Administered 2021-04-06: 6 via TOPICAL

## 2021-04-06 MED ORDER — NOREPINEPHRINE 4 MG/250ML-% IV SOLN
0.0000 ug/min | INTRAVENOUS | Status: DC
  Administered 2021-04-06: 2 ug/min via INTRAVENOUS
  Filled 2021-04-06: qty 250

## 2021-04-06 MED ORDER — INSULIN REGULAR(HUMAN) IN NACL 100-0.9 UT/100ML-% IV SOLN
INTRAVENOUS | Status: DC
  Administered 2021-04-06: 8 [IU]/h via INTRAVENOUS
  Administered 2021-04-06: 2.6 [IU]/h via INTRAVENOUS
  Filled 2021-04-06 (×2): qty 100

## 2021-04-06 MED ORDER — SODIUM CHLORIDE 0.9% FLUSH: 3.0000 mL | INTRAVENOUS | Status: DC | PRN

## 2021-04-06 MED ORDER — SODIUM CHLORIDE 0.9 % IV SOLN: INTRAVENOUS | Status: DC

## 2021-04-06 MED ORDER — AZTREONAM 2 G IJ SOLR
2.0000 g | Freq: Once | INTRAMUSCULAR | Status: AC
  Administered 2021-04-06: 2 g via INTRAVENOUS
  Filled 2021-04-06: qty 2

## 2021-04-06 MED ORDER — ONDANSETRON HCL 4 MG/2ML IJ SOLN
4.0000 mg | Freq: Four times a day (QID) | INTRAMUSCULAR | Status: DC | PRN
  Administered 2021-04-07 – 2021-04-12 (×3): 4 mg via INTRAVENOUS
  Filled 2021-04-06 (×3): qty 2

## 2021-04-06 MED ORDER — SODIUM CHLORIDE 0.9 % IV BOLUS: 1500.0000 mL | Freq: Once | INTRAVENOUS | Status: DC

## 2021-04-06 MED ORDER — SODIUM CHLORIDE 0.9 % IV SOLN
500.0000 mg | INTRAVENOUS | Status: DC
  Administered 2021-04-06 – 2021-04-08 (×3): 500 mg via INTRAVENOUS
  Filled 2021-04-06 (×4): qty 500

## 2021-04-06 MED ORDER — ACETAMINOPHEN 325 MG PO TABS: 650.0000 mg | ORAL_TABLET | Freq: Four times a day (QID) | ORAL | Status: DC | PRN

## 2021-04-06 MED ORDER — MIDAZOLAM HCL 2 MG/2ML IJ SOLN: 2.0000 mg | Freq: Once | INTRAMUSCULAR | Status: AC

## 2021-04-06 MED ORDER — MIDAZOLAM 50MG/50ML (1MG/ML) PREMIX INFUSION
0.5000 mg/h | INTRAVENOUS | Status: DC
  Administered 2021-04-06: 0.5 mg/h via INTRAVENOUS
  Administered 2021-04-06: 1.5 mg/h via INTRAVENOUS
  Filled 2021-04-06: qty 50

## 2021-04-06 MED ORDER — FUROSEMIDE 10 MG/ML IJ SOLN
40.0000 mg | Freq: Once | INTRAMUSCULAR | Status: AC
  Administered 2021-04-06: 40 mg via INTRAVENOUS
  Filled 2021-04-06: qty 4

## 2021-04-06 MED ORDER — VITAL HIGH PROTEIN PO LIQD
1000.0000 mL | ORAL | Status: DC
  Administered 2021-04-06: 1000 mL

## 2021-04-06 MED ORDER — MIDAZOLAM HCL 2 MG/2ML IJ SOLN
2.0000 mg | Freq: Once | INTRAMUSCULAR | Status: AC
  Administered 2021-04-06: 2 mg via INTRAVENOUS
  Filled 2021-04-06: qty 2

## 2021-04-06 MED ORDER — IPRATROPIUM-ALBUTEROL 0.5-2.5 (3) MG/3ML IN SOLN
3.0000 mL | Freq: Four times a day (QID) | RESPIRATORY_TRACT | Status: DC
  Administered 2021-04-06 – 2021-04-07 (×4): 3 mL via RESPIRATORY_TRACT
  Filled 2021-04-06 (×4): qty 3

## 2021-04-06 MED ORDER — POLYETHYLENE GLYCOL 3350 17 G PO PACK: 17.0000 g | PACK | Freq: Every day | ORAL | Status: DC | PRN

## 2021-04-06 MED ORDER — MIDAZOLAM HCL 2 MG/2ML IJ SOLN: 1.0000 mg | INTRAMUSCULAR | Status: DC | PRN

## 2021-04-06 MED ORDER — POTASSIUM CHLORIDE 20 MEQ PO PACK
40.0000 meq | PACK | Freq: Once | ORAL | Status: AC
  Administered 2021-04-06: 40 meq
  Filled 2021-04-06: qty 2

## 2021-04-06 MED ORDER — ALBUTEROL SULFATE (2.5 MG/3ML) 0.083% IN NEBU
INHALATION_SOLUTION | RESPIRATORY_TRACT | Status: AC
  Administered 2021-04-06: 2.5 mg
  Filled 2021-04-06: qty 3

## 2021-04-06 MED ORDER — BUDESONIDE 0.25 MG/2ML IN SUSP
0.2500 mg | Freq: Two times a day (BID) | RESPIRATORY_TRACT | Status: DC
  Administered 2021-04-06 – 2021-04-09 (×7): 0.25 mg via RESPIRATORY_TRACT
  Filled 2021-04-06 (×6): qty 2

## 2021-04-06 MED ORDER — IPRATROPIUM BROMIDE 0.02 % IN SOLN
0.5000 mg | Freq: Once | RESPIRATORY_TRACT | Status: AC
  Administered 2021-04-06: 0.5 mg via RESPIRATORY_TRACT
  Filled 2021-04-06 (×2): qty 2.5

## 2021-04-06 MED ORDER — FENTANYL 2500MCG IN NS 250ML (10MCG/ML) PREMIX INFUSION
0.0000 ug/h | INTRAVENOUS | Status: DC
  Administered 2021-04-06: 75 ug/h via INTRAVENOUS
  Administered 2021-04-06: 25 ug/h via INTRAVENOUS
  Filled 2021-04-06 (×2): qty 250

## 2021-04-06 MED ORDER — FENTANYL CITRATE (PF) 100 MCG/2ML IJ SOLN
50.0000 ug | INTRAMUSCULAR | Status: DC | PRN
  Administered 2021-04-06: 50 ug via INTRAVENOUS

## 2021-04-06 MED ORDER — LACTATED RINGERS IV SOLN: INTRAVENOUS | Status: DC

## 2021-04-06 MED ORDER — INSULIN GLARGINE 100 UNIT/ML ~~LOC~~ SOLN
15.0000 [IU] | Freq: Every day | SUBCUTANEOUS | Status: DC
  Administered 2021-04-06 – 2021-04-08 (×3): 15 [IU] via SUBCUTANEOUS
  Filled 2021-04-06 (×4): qty 0.15

## 2021-04-06 MED ORDER — INSULIN GLARGINE 100 UNIT/ML ~~LOC~~ SOLN
15.0000 [IU] | Freq: Every day | SUBCUTANEOUS | Status: DC
  Filled 2021-04-06: qty 0.15

## 2021-04-06 MED ORDER — PANTOPRAZOLE SODIUM 40 MG IV SOLR
40.0000 mg | INTRAVENOUS | Status: DC
  Administered 2021-04-06 – 2021-04-09 (×4): 40 mg via INTRAVENOUS
  Filled 2021-04-06 (×4): qty 40

## 2021-04-06 MED ORDER — SODIUM CHLORIDE 0.9% FLUSH
3.0000 mL | Freq: Two times a day (BID) | INTRAVENOUS | Status: DC
  Administered 2021-04-06 – 2021-04-12 (×12): 3 mL via INTRAVENOUS

## 2021-04-06 MED ORDER — DEXMEDETOMIDINE HCL IN NACL 400 MCG/100ML IV SOLN
0.0000 ug/kg/h | INTRAVENOUS | Status: DC
  Administered 2021-04-06: 1.2 ug/kg/h via INTRAVENOUS
  Administered 2021-04-06: 0.5 ug/kg/h via INTRAVENOUS
  Administered 2021-04-07: 1.2 ug/kg/h via INTRAVENOUS
  Filled 2021-04-06 (×3): qty 100

## 2021-04-06 MED ORDER — DOCUSATE SODIUM 50 MG/5ML PO LIQD
100.0000 mg | Freq: Two times a day (BID) | ORAL | Status: DC | PRN
  Administered 2021-04-09: 100 mg
  Filled 2021-04-06 (×3): qty 10

## 2021-04-06 MED ORDER — ONDANSETRON HCL 4 MG PO TABS: 4.0000 mg | ORAL_TABLET | Freq: Four times a day (QID) | ORAL | Status: DC | PRN

## 2021-04-06 MED ORDER — VANCOMYCIN VARIABLE DOSE PER UNSTABLE RENAL FUNCTION (PHARMACIST DOSING): Status: DC

## 2021-04-06 MED ORDER — LORAZEPAM 2 MG/ML IJ SOLN
1.0000 mg | Freq: Once | INTRAMUSCULAR | Status: AC
  Administered 2021-04-06: 1 mg via INTRAVENOUS

## 2021-04-06 MED ORDER — SODIUM CHLORIDE 0.9 % IV SOLN
2.0000 g | INTRAVENOUS | Status: DC
  Administered 2021-04-06: 2 g via INTRAVENOUS
  Filled 2021-04-06: qty 2

## 2021-04-06 MED ORDER — DEXTROSE 50 % IV SOLN: 0.0000 mL | INTRAVENOUS | Status: DC | PRN

## 2021-04-06 MED ORDER — METHYLPREDNISOLONE SODIUM SUCC 40 MG IJ SOLR
40.0000 mg | Freq: Two times a day (BID) | INTRAMUSCULAR | Status: DC
  Administered 2021-04-06 – 2021-04-08 (×5): 40 mg via INTRAVENOUS
  Filled 2021-04-06 (×5): qty 1

## 2021-04-06 MED ORDER — IPRATROPIUM BROMIDE 0.02 % IN SOLN
RESPIRATORY_TRACT | Status: AC
  Administered 2021-04-06: 0.5 mg
  Filled 2021-04-06: qty 2.5

## 2021-04-06 MED ORDER — HEPARIN SODIUM (PORCINE) 5000 UNIT/ML IJ SOLN
5000.0000 [IU] | Freq: Three times a day (TID) | INTRAMUSCULAR | Status: DC
  Administered 2021-04-06 – 2021-04-12 (×19): 5000 [IU] via SUBCUTANEOUS
  Filled 2021-04-06 (×19): qty 1

## 2021-04-06 MED ORDER — SODIUM CHLORIDE 0.9 % IV SOLN: 250.0000 mL | INTRAVENOUS | Status: DC | PRN

## 2021-04-06 MED ORDER — CHLORHEXIDINE GLUCONATE 0.12% ORAL RINSE (MEDLINE KIT)
15.0000 mL | Freq: Two times a day (BID) | OROMUCOSAL | Status: DC
  Administered 2021-04-06 – 2021-04-07 (×3): 15 mL via OROMUCOSAL

## 2021-04-06 MED ORDER — MIDAZOLAM HCL 2 MG/2ML IJ SOLN
INTRAMUSCULAR | Status: AC
  Administered 2021-04-06: 2 mg via INTRAVENOUS
  Filled 2021-04-06: qty 2

## 2021-04-06 MED ORDER — NAPHAZOLINE-PHENIRAMINE 0.027-0.315 % OP SOLN: 1.0000 [drp] | Freq: Every day | OPHTHALMIC | Status: DC | PRN

## 2021-04-06 MED ORDER — ALBUTEROL SULFATE (2.5 MG/3ML) 0.083% IN NEBU
INHALATION_SOLUTION | RESPIRATORY_TRACT | Status: AC
  Administered 2021-04-06: 10 mg
  Filled 2021-04-06: qty 12

## 2021-04-06 MED ORDER — INSULIN ASPART 100 UNIT/ML IJ SOLN
0.0000 [IU] | INTRAMUSCULAR | Status: DC
  Administered 2021-04-06: 3 [IU] via SUBCUTANEOUS
  Administered 2021-04-06 (×2): 4 [IU] via SUBCUTANEOUS
  Administered 2021-04-07: 11 [IU] via SUBCUTANEOUS
  Administered 2021-04-07: 7 [IU] via SUBCUTANEOUS
  Administered 2021-04-07: 3 [IU] via SUBCUTANEOUS
  Administered 2021-04-07: 11 [IU] via SUBCUTANEOUS
  Administered 2021-04-07: 4 [IU] via SUBCUTANEOUS
  Administered 2021-04-08: 3 [IU] via SUBCUTANEOUS

## 2021-04-06 MED ORDER — VANCOMYCIN HCL IN DEXTROSE 1-5 GM/200ML-% IV SOLN
1000.0000 mg | Freq: Once | INTRAVENOUS | Status: AC
  Administered 2021-04-06: 1000 mg via INTRAVENOUS
  Filled 2021-04-06: qty 200

## 2021-04-06 MED ORDER — ALBUTEROL (5 MG/ML) CONTINUOUS INHALATION SOLN
10.0000 mg/h | INHALATION_SOLUTION | Freq: Once | RESPIRATORY_TRACT | Status: DC
  Filled 2021-04-06: qty 20

## 2021-04-06 MED ORDER — DEXTROSE IN LACTATED RINGERS 5 % IV SOLN: INTRAVENOUS | Status: DC

## 2021-04-06 MED ORDER — LORAZEPAM 2 MG/ML IJ SOLN
INTRAMUSCULAR | Status: AC
  Filled 2021-04-06: qty 1

## 2021-04-06 MED ORDER — PROPOFOL 1000 MG/100ML IV EMUL
5.0000 ug/kg/min | INTRAVENOUS | Status: DC
  Administered 2021-04-06: 5 ug/kg/min via INTRAVENOUS
  Filled 2021-04-06: qty 200

## 2021-04-06 MED ORDER — ORAL CARE MOUTH RINSE
15.0000 mL | OROMUCOSAL | Status: DC
  Administered 2021-04-06 – 2021-04-07 (×9): 15 mL via OROMUCOSAL

## 2021-04-06 MED ORDER — BUDESONIDE 0.25 MG/2ML IN SUSP
RESPIRATORY_TRACT | Status: AC
  Filled 2021-04-06: qty 2

## 2021-04-06 MED ORDER — FENTANYL CITRATE (PF) 100 MCG/2ML IJ SOLN
50.0000 ug | INTRAMUSCULAR | Status: DC | PRN
Start: 2021-04-06 — End: 2021-04-07
  Administered 2021-04-06 (×2): 50 ug via INTRAVENOUS
  Filled 2021-04-06: qty 2

## 2021-04-06 NOTE — H&P (Signed)
Frail female intubated and sedated poorly responsive  NAME:  Hannah French, MRN:  [id number], DOB:  [birth year], LOS: 0 ADMISSION DATE:  04/06/2021, CONSULTATION DATE: 04/06/2021 REFERRING MD: [facility] ED PA, CHIEF COMPLAINT: Monitor dependent respiratory failure, hypotension, acute exacerbation of COPD in the setting of a 80 year old who still smokes  History of Present Illness:  80 year old female who is a lifelong smoker and is followed by pulmonary by Dr. Melvyn Novas and by Dr. Haroldine Laws of the cardiological heart failure team.  She was in her normal state of poor health until approximately 5 days ago when she noticed increasing lower extremity edema which she call for appointment with cardiology.  She had increasing shortness of breath and weakness was seen by her primary care physician and given steroids. Evaluated in the emergency department found to be increasingly short of breath hypoxic acute distress at which time she was intubated by the emergency department physician.  Due to the fact she has known systolic heart failure followed by heart failure team with a known EF of 35 to 40% and with coronary artery disease status post stent placement with a left bundle branch block.  She cannot utilize beta-blockers due to to a chronically low blood pressure.  On 04/06/2021 she is being transferred to [facility] to the heart unit for further evaluation and treatment.  She will be transferred to the pulmonary critical care team at [facility].  We will asked the heart failure team to sign and and weigh options for her continued care.  Of note she was given vancomycin lactam and Zithromax in the emergency department her procalcitonin was noted to be less than 0.1 she does have leukocytosis but has been on steroids for 5 days.  Pertinent  Medical History   Past Medical History:  Diagnosis Date   Abdominal bruit    Bladder cancer ([place])    CAD (coronary artery disease)    a. nonobstructive by  cath 08/2018.   Carotid bruit    Chronic combined systolic and diastolic CHF (congestive heart failure) (HCC)    CKD (chronic kidney disease), stage II    Congestive heart failure (CHF) (HCC)    Diabetes mellitus    Type II   Dyslipidemia    Heart murmur    Mild pulmonary hypertension (HCC)    Mitral valve prolapse    a. not seen on most recent echoes. Mild MR now.   NICM (nonischemic cardiomyopathy) ([place])    Osteoporosis    Pancreatitis, acute    Pseudogout    PVD (peripheral vascular disease) ([place])    a. 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   Sinus tachycardia    Tobacco abuse      Significant Hospital Events: Including procedures, antibiotic start and stop dates in addition to other pertinent events   Seen at [facility] urgency department 7/1 13 /2022 and required intubation. 04/07/2019 transferred to [facility] for further evaluation and treatment  Interim History / Subjective:  80 year old female smoker is in acute respiratory distress intubated placed on mechanical ventilatory support and transferred to [facility].  Note propofol stopped due to low blood pressure placed on fentanyl and Versed  Objective   Blood pressure (!) 114/50, pulse (!) 106, temperature (!) 97 F (36.1 C), resp. rate (!) 25, height 4\' 11"  (1.499 m), weight 51 kg, SpO2 97 %.    Vent Mode: PRVC FiO2 (%):  [40 %-60 %] 40 % Set Rate:  [  16 bmp-22 bmp] 22 bmp Vt Set:  [340 mL] 340 mL PEEP:  [5 cmH20] 5 cmH20 Plateau Pressure:  [17 cmH20-22 cmH20] 17 cmH20  No intake or output data in the 24 hours ending 04/06/21 0950 Filed Weights   04/06/21 0300  Weight: 51 kg    Examination: General: Frail elderly female on full ventilatory support HENT: No JVD or lymphadenopathy is appreciated Lungs: Coarse rhonchi bilaterally Cardiovascular: Heart sounds are regular Abdomen: Mild distention positive bowel sounds Extremities: Positive  lower extremity edema Neuro: Sedated on full mechanical ventilatory support GU: Amber urine   Resolved Hospital Problem list     Assessment & Plan:  Acute respiratory failure most likely multifactorial with underlying COPD with continued tobacco abuse, advanced age of 78, known systolic heart failure coronary disease status post stent in 09/2020 Transferred to [facility] from [facility] 04/07/1999 2022 Placed on full mechanical ventilatory support Bronchodilator Continue systemic steroids No need for inhaled steroids at this point Wean per protocol Central line placed 04/07/2019  Note heart failure with EF 35 to 40% status post stent in January 2022 and is followed by the heart failure clinic. We will asked heart failure team to see and evaluate at this time on admission  Hyperglycemia with known diabetes mellitus poorly controlled and steroid exacerbation Sliding-scale insulin protocol Most likely will need insulin drip  Acute kidney injury Lab Results  Component Value Date   CREATININE 2.37 (H) 04/06/2021   CREATININE 2.13 (H) 04/06/2021   CREATININE 1.53 (H) 04/01/2021   CREATININE 0.67 02/06/2013  Monitor Avoid nephrotoxins  central line for proper management     Best Practice (right click and "Reselect all SmartList Selections" daily)   Diet/type: NPO DVT prophylaxis: prophylactic heparin  GI prophylaxis: PPI Lines: Central line Foley:  Yes, and it is still needed Code Status:  full code Last date of multidisciplinary goals of care discussion [tbd]  Labs   CBC: Recent Labs  Lab 04/01/21 1450 04/06/21 0347 04/06/21 0857  WBC 11.2* 16.1* 22.4*  NEUTROABS 8.2* 12.3*  --   HGB 11.1 11.9* 10.1*  HCT 33.9* 40.2 34.1*  MCV 95 107.2* 106.6*  PLT 448 651* 455*    Basic Metabolic Panel: Recent Labs  Lab 04/01/21 1450 04/06/21 0347 04/06/21 0857  NA 140 136  --   K 4.5 4.9  --   CL 97 98  --   CO2 27 27  --   GLUCOSE 151* 496*  --    BUN 29* 56*  --   CREATININE 1.53* 2.13* 2.37*  CALCIUM 9.6 8.7*  --    GFR: Estimated Creatinine Clearance: 12.9 mL/min (A) (by C-G formula based on SCr of 2.37 mg/dL (H)). Recent Labs  Lab 04/01/21 1450 04/06/21 0347 04/06/21 0534 04/06/21 0849 04/06/21 0857  PROCALCITON  --   --  <0.10  --   --   WBC 11.2* 16.1*  --   --  22.4*  LATICACIDVEN  --  4.2* 3.3* 4.7*  --     Liver Function Tests: Recent Labs  Lab 04/01/21 1450  AST 13  ALT 10  ALKPHOS 64  BILITOT 0.6  PROT 6.4  ALBUMIN 4.4   No results for input(s): LIPASE, AMYLASE in the last 168 hours. No results for input(s): AMMONIA in the last 168 hours.  ABG    Component Value Date/Time   PHART 7.226 (L) 04/06/2021 0611   PCO2ART 55.9 (H) 04/06/2021 0611   PO2ART 85.7 04/06/2021 2951  HCO3 20.1 04/06/2021 0611   TCO2 31 10/13/2020 1229   ACIDBASEDEF 4.0 (H) 04/06/2021 0611   O2SAT 94.6 04/06/2021 0611     Coagulation Profile: No results for input(s): INR, PROTIME in the last 168 hours.  Cardiac Enzymes: No results for input(s): CKTOTAL, CKMB, CKMBINDEX, TROPONINI in the last 168 hours.  HbA1C: HB A1C (BAYER DCA - WAIVED)  Date/Time Value Ref Range Status  12/09/2020 10:42 AM 8.1 (H) <7.0 % Final    Comment:                                          Diabetic Adult            <7.0                                       Healthy Adult        4.3 - 5.7                                                           (DCCT/NGSP) American Diabetes Association's Summary of Glycemic Recommendations for Adults with Diabetes: Hemoglobin A1c <7.0%. More stringent glycemic goals (A1c <6.0%) may further reduce complications at the cost of increased risk of hypoglycemia.   09/09/2020 10:30 AM 8.3 (H) <7.0 % Final    Comment:                                          Diabetic Adult            <7.0                                       Healthy Adult        4.3 - 5.7                                                            (DCCT/NGSP) American Diabetes Association's Summary of Glycemic Recommendations for Adults with Diabetes: Hemoglobin A1c <7.0%. More stringent glycemic goals (A1c <6.0%) may further reduce complications at the cost of increased risk of hypoglycemia.    Hgb A1c MFr Bld  Date/Time Value Ref Range Status  01/17/2021 04:36 AM 9.8 (H) 4.8 - 5.6 % Final    Comment:    (NOTE) Pre diabetes:          5.7%-6.4%  Diabetes:              >6.4%  Glycemic control for   <7.0% adults with diabetes     CBG: Recent Labs  Lab 04/06/21 0733 04/06/21 0802 04/06/21 0829 04/06/21 0903 04/06/21 0932  GLUCAP 528* 505* 464* 451* 393*    Review of Systems:   NA  Past Medical History:  She,  has a past medical history of Abdominal bruit, Bladder cancer ([place]), CAD (coronary artery disease), Carotid bruit, Chronic combined systolic and diastolic CHF (congestive heart failure) ([place]), CKD (chronic kidney disease), stage II, Congestive heart failure (CHF) ([place]), Diabetes mellitus, Dyslipidemia, Heart murmur, Mild pulmonary hypertension ([place]), Mitral valve prolapse, NICM (nonischemic cardiomyopathy) ([place]), Osteoporosis, Pancreatitis, acute, Pseudogout, PVD (peripheral vascular disease) ([place]), Sinus tachycardia, and Tobacco abuse.   Surgical History:   Past Surgical History:  Procedure Laterality Date   ABDOMINAL AORTOGRAM W/LOWER EXTREMITY N/A 12/31/2019   Procedure: ABDOMINAL AORTOGRAM W/LOWER EXTREMITY;  Surgeon: Wellington Hampshire, MD;  Location: [place] [facility];  Service: Cardiovascular;  Laterality: N/A;   ABDOMINAL AORTOGRAM W/LOWER EXTREMITY N/A 09/01/2020   Procedure: ABDOMINAL AORTOGRAM W/LOWER EXTREMITY;  Surgeon: Wellington Hampshire, MD;  Location: [place] [facility];  Service: Cardiovascular;  Laterality: N/A;   APPENDECTOMY     CORONARY STENT INTERVENTION N/A 10/13/2020   Procedure: CORONARY STENT INTERVENTION;  Surgeon: Leonie Man, MD;  Location: [place] [facility];  Service:  Cardiovascular;  Laterality: N/A;   Hysterectomy-type unspecified     INTRAVASCULAR PRESSURE WIRE/FFR STUDY N/A 10/13/2020   Procedure: INTRAVASCULAR PRESSURE WIRE/FFR STUDY;  Surgeon: Leonie Man, MD;  Location: [place] [facility];  Service: Cardiovascular;  Laterality: N/A;   LAPAROSCOPIC CHOLECYSTECTOMY  2009   PERIPHERAL VASCULAR INTERVENTION  12/31/2019   Procedure: PERIPHERAL VASCULAR INTERVENTION;  Surgeon: Wellington Hampshire, MD;  Location: [place] [facility];  Service: Cardiovascular;;  Bilateral Ili[place]   RIGHT/LEFT HEART CATH AND CORONARY ANGIOGRAPHY N/A 09/16/2018   Procedure: RIGHT/LEFT HEART CATH AND CORONARY ANGIOGRAPHY;  Surgeon: Sherren Mocha, MD;  Location: [place] [facility];  Service: Cardiovascular;  Laterality: N/A;   RIGHT/LEFT HEART CATH AND CORONARY ANGIOGRAPHY N/A 10/13/2020   Procedure: RIGHT/LEFT HEART CATH AND CORONARY ANGIOGRAPHY;  Surgeon: Jolaine Artist, MD;  Location: [place] [facility];  Service: Cardiovascular;  Laterality: N/A;     Social History:   reports that she has been smoking cigarettes. She has a 50.00 pack-year smoking history. She has never used smokeless tobacco. She reports that she does not drink alcohol and does not use drugs.   Family History:  Her family history includes COPD in her brother; Congestive Heart Failure in her brother and daughter; Heart attack in her brother and maternal uncle; Heart disease in her sister; Heart failure in her mother; Hypertension in her sister; Ovarian cancer in her sister; Rheum arthritis in her son. There is no history of Coronary artery disease.   Allergies Allergies  Allergen Reactions   Contrast Media [Iodinated Diagnostic Agents] Shortness Of Breath   Iohexol Other (See Comments)    PASSED OUT DURING THE TEST     Metformin Swelling    Face and hands became swollen   Statins Swelling and Other (See Comments)    They make the patient hurt "all over" Myalgia, also   Bextra [Valdecoxib] Nausea  And Vomiting and Swelling    Shut down my kidneys    Cefdinir Other (See Comments)    Pancreatitis    Cozaar [Losartan Potassium] Other (See Comments)    dizziness   Gabapentin    Nsaids Other (See Comments)    GI intolerance and pain all over, tolerates ibu    Zetia [Ezetimibe] Swelling   Rofecoxib Other (See Comments)    "VIOXX"- "SHUT DOWN MY KIDNEYS"   Sulfa Antibiotics Nausea And Vomiting and Other (See Comments)    Pancreatitis  Home Medications  Prior to Admission medications   Medication Sig Start Date End Date Taking? Authorizing Provider  acetaminophen (TYLENOL) 500 MG tablet Take 500 mg by mouth every 6 (six) hours as needed for moderate pain.   Yes [provider]  albuterol (VENTOLIN HFA) 108 (90 Base) MCG/ACT inhaler Inhale 2 puffs into the lungs every 6 (six) hours as needed for wheezing or shortness of breath. 02/02/21  Yes Martyn Ehrich, NP  Cholecalciferol (VITAMIN D) 50 MCG (2000 UT) tablet Take 4,000 Units by mouth daily.    Yes [provider]  clopidogrel (PLAVIX) 75 MG tablet Take 1 tablet (75 mg total) by mouth daily. 10/13/20  Yes Reino Bellis B, NP  ferrous sulfate 325 (65 FE) MG tablet Take 1 tablet (325 mg total) by mouth daily with breakfast. 04/01/21 05/01/21 Yes Dettinger, Fransisca Kaufmann, MD  Fluticasone-Umeclidin-Vilant (TRELEGY ELLIPTA) 100-62.5-25 MCG/INH AEPB Inhale 1 puff into the lungs daily. 02/02/21  Yes Martyn Ehrich, NP  furosemide (LASIX) 20 MG tablet Take 2 tablets daily. You may take an extra dose for increase weight gain orswelling 09/09/20  Yes Dettinger, Fransisca Kaufmann, MD  glimepiride (AMARYL) 2 MG tablet Take 1-2 tablets (2-4 mg total) by mouth daily with breakfast. 09/09/20  Yes Dettinger, Fransisca Kaufmann, MD  Glucose Blood (BLOOD GLUCOSE TEST STRIPS) STRP 1 strip by In Vitro route 2 (two) times daily. 04/01/21  Yes Dettinger, Fransisca Kaufmann, MD  insulin aspart (NOVOLOG) 100 UNIT/ML injection Inject 5-20 Units into the skin 3 (three)  times daily before meals. 02/02/21  Yes Dettinger, Fransisca Kaufmann, MD  insulin degludec (TRESIBA FLEXTOUCH) 100 UNIT/ML FlexTouch Pen Inject 10 Units into the skin daily. 09/09/20  Yes Dettinger, Fransisca Kaufmann, MD  levalbuterol (XOPENEX) 0.63 MG/3ML nebulizer solution NEBULIZE 1 VIAL EVERY 4 HOURS AS NEEDED FOR WHEEZING OR SHORTNESS OF BREATH 03/11/21  Yes Tanda Rockers, MD  metolazone (ZAROXOLYN) 2.5 MG tablet Take 1 tablet (2.5 mg total) by mouth as directed. By HF clinic 04/04/21  Yes Bensimhon, Shaune Pascal, MD  metoprolol succinate (TOPROL-XL) 25 MG 24 hr tablet TAKE 1 TABLET DAILY 03/16/21  Yes Bensimhon, Shaune Pascal, MD  Naphazoline-Pheniramine (OPCON-A) 0.027-0.315 % SOLN Place 1 drop into both eyes daily as needed (Burning and itching eyes).   Yes [provider]  nitroGLYCERIN (NITRODUR - DOSED IN MG/24 HR) 0.2 mg/hr patch Place 1 patch (0.2 mg total) onto the skin daily. 11/02/20  Yes Newt Minion, MD  nitroGLYCERIN (NITROSTAT) 0.3 MG SL tablet Place 1 tablet (0.3 mg total) under the tongue every 5 (five) minutes as needed for chest pain. 02/02/21 02/02/22 Yes Dettinger, Fransisca Kaufmann, MD  pantoprazole (PROTONIX) 40 MG tablet Take 1 tablet (40 mg total) by mouth 2 (two) times daily for 20 days. 02/22/21 03/23/22 Yes Shahmehdi, Seyed A, MD  pentoxifylline (TRENTAL) 400 MG CR tablet TAKE  (1)  TABLET  THREE TIMES DAILY WITH MEALS. 03/10/21  Yes Newt Minion, MD  potassium chloride SA (KLOR-CON) 20 MEQ tablet Take 2 tablets (40 mEq total) by mouth as directed. Only when you take Metolazone 04/04/21  Yes Bensimhon, Shaune Pascal, MD  predniSONE (DELTASONE) 20 MG tablet Take 3 tabs daily for 1 week, then 2 tabs daily for week 2, then 1 tab daily for week 3. 04/01/21  Yes Dettinger, Fransisca Kaufmann, MD  sennosides-docusate sodium (SENOKOT-S) 8.6-50 MG tablet Take 1 tablet by mouth daily. 04/04/21  Yes Dettinger, Fransisca Kaufmann, MD     Critical care time: 10  Richardson Landry Shawndell Schillaci ACNP Acute Care Nurse Practitioner [place] Please consult Amion 04/06/2021, 9:50 AM

## 2021-04-06 NOTE — ED Notes (Signed)
Date and time results received: 04/06/21 0436   Test: lactic Critical Value: 4.2  Name of Provider Notified: horton  Orders Received? Or Actions Taken?: NA

## 2021-04-06 NOTE — Progress Notes (Signed)
Inpatient Diabetes Program Recommendations  AACE/ADA: New Consensus Statement on Inpatient Glycemic Control (2015)  Target Ranges:  Prepandial:   less than 140 mg/dL      Peak postprandial:   less than 180 mg/dL (1-2 hours)      Critically ill patients:  140 - 180 mg/dL   Lab Results  Component Value Date   GLUCAP 338 (H) 04/06/2021   HGBA1C 9.8 (H) 01/17/2021    Review of Glycemic Control  Diabetes history: DM 2 Outpatient Diabetes medications: Amaryl 2-4 mg Daily, Novolog 5-20 units tid, Tresiba 10 units Current orders for Inpatient glycemic control: IV insulin for now  Recent steroid use IV solumedrol 40 mg Q12 hours A1c 9.8% on 4/25 Glucose over 500 on presentation  Inpatient Diabetes Program Recommendations:    IV insulin for now A1c in process  At time of transition consider: - Levemir 8 units (80% home dose) - Novolog 0-6 units Q4 (if transitioning evening overnight)  Thanks,  Tama Headings RN, MSN, BC-ADM Inpatient Diabetes Coordinator Team Pager [phone] (8a-5p)

## 2021-04-06 NOTE — ED Notes (Signed)
Pt placed on cardiac monitor with BP to set cycle every 30 minutes. Continuous pulse oximeter applied.  

## 2021-04-06 NOTE — Sepsis Progress Note (Signed)
Monitoring for code sepsis protocol. 

## 2021-04-06 NOTE — ED Notes (Signed)
Pt placed on vent by RT. Initial settings determined by RT.

## 2021-04-06 NOTE — ED Notes (Signed)
Date and time results received: 04/06/21 0623  Test: Lactic Acid Critical Value: 3.3  Name of Provider Notified: Dr. Dina Rich  Orders Received? Or Actions Taken?: see chart

## 2021-04-06 NOTE — Progress Notes (Signed)
Received pt sedated, not in distress, with ETT to MV with Fi02=40%, with ongoing fentanyl and precedex infusion, with Norepinephrine drip, with ongoing Ogt feeding at 20 ml/hr, with order to maintain for 24 hrs, with foley catheter to bag

## 2021-04-06 NOTE — Sepsis Progress Note (Signed)
Notified provider of need to order fluid bolus.  Per MD via secure chat, no fluid resuscitation will be ordered 2/2 fluid status, patient not hypotensive & suspect elevated lactic is related to WOB.

## 2021-04-06 NOTE — ED Triage Notes (Signed)
Pt is sob and confused, she will not keep o2 on.

## 2021-04-06 NOTE — Progress Notes (Signed)
Pharmacy Antibiotic Note  Hannah French is a 80 y.o. female admitted on 04/06/2021 with sepsis.  Pharmacy has been consulted for vancomycin and cefepime dosing.  Received 1g of vancomycin at [facility] this AM at 0415 AM.  Scr rising rapidly, currently 2.37  Plan: Cefepime 2g IV q 24 hrs. No further vancomycin doses for now, will check random level in AM for redosing. F/u cultures, renal function and clinical course.  Height: (P) 4\' 11"  (149.9 cm) Weight: 51 kg (112 lb 7 oz) IBW/kg (Calculated) : (P) 43.2  Temp (24hrs), Avg:96.6 F (35.9 C), Min:92.8 F (33.8 C), Max:97.88 F (36.6 C)  Recent Labs  Lab 04/01/21 1450 04/06/21 0347 04/06/21 0534 04/06/21 0849 04/06/21 0857  WBC 11.2* 16.1*  --   --  22.4*  CREATININE 1.53* 2.13*  --   --  2.37*  LATICACIDVEN  --  4.2* 3.3* 4.7*  --     Estimated Creatinine Clearance: 12.9 mL/min (A) (by C-G formula based on SCr of 2.37 mg/dL (H)).    Allergies  Allergen Reactions   Contrast Media [Iodinated Diagnostic Agents] Shortness Of Breath   Iohexol Other (See Comments)    PASSED OUT DURING THE TEST     Metformin Swelling    Face and hands became swollen   Statins Swelling and Other (See Comments)    They make the patient hurt "all over" Myalgia, also   Bextra [Valdecoxib] Nausea And Vomiting and Swelling    Shut down my kidneys    Cefdinir Other (See Comments)    Pancreatitis    Cozaar [Losartan Potassium] Other (See Comments)    dizziness   Gabapentin    Nsaids Other (See Comments)    GI intolerance and pain all over, tolerates ibu    Zetia [Ezetimibe] Swelling   Rofecoxib Other (See Comments)    "VIOXX"- "SHUT DOWN MY KIDNEYS"   Sulfa Antibiotics Nausea And Vomiting and Other (See Comments)    Pancreatitis    Antimicrobials this admission: Vancomycin 7/13 > Cefepime 7/13>   Dose adjustments this admission:  Microbiology results:   Thank you for allowing pharmacy to be a part of this patient's  care.  [place] Crane, Roylene Reason, BCCP Clinical Pharmacist  04/06/2021 1:37 PM   [facility] pharmacy phone numbers are listed on [email or web address]

## 2021-04-06 NOTE — ED Notes (Addendum)
Time Out performed for intubation. BP set to cycle every 5 minutes. Preoxygenation with 100% NRB  Etomidate 20mg  IVP Succinycholine 150mg  IVP. Pt intubated by Dr. Dina Rich with 7.5 ETT to 21 cm at teeth, positive color change, bilateral breath sounds auscultated with bagging thereafter. ETT secured with Anchor Fast ETT holder. OGT inserted by Celedonio Savage, RN. Temp foley inserted by Gregary Signs, NT with Becky Sax as second person.

## 2021-04-06 NOTE — Progress Notes (Signed)
This RN wasted 69mL of versed and 290mL of fentanyl with Candiss Norse, RN at bedside.  Both bags were infusing on arrival and wasted once new bags/tubing were hung for new central line.

## 2021-04-06 NOTE — Procedures (Addendum)
Central Venous Catheter Insertion Procedure Note Hannah French [id number] [birth year]  Procedure: Insertion of Central Venous Catheter Indications: Assessment of intravascular volume  Procedure Details Consent: Risks of procedure as well as the alternatives and risks of each were explained to the (patient/caregiver).  Consent for procedure obtained. Time Out: Verified patient identification, verified procedure, site/side was marked, verified correct patient position, special equipment/implants available, medications/allergies/relevent history reviewed, required imaging and test results available.  Performed  Maximum sterile technique was used including antiseptics, cap, gloves, gown, hand hygiene, mask, and sheet. Skin prep: Chlorhexidine; local anesthetic administered A antimicrobial bonded/coated triple lumen catheter was placed in the left internal jugular vein using the Seldinger technique. Ultrasound guidance used.Yes.   Catheter placed to 20 cm. Blood aspirated via all 3 ports and then flushed x 3. Line sutured x 2 and dressing applied.  Evaluation Blood flow good Complications: No apparent complications Patient did tolerate procedure well. Chest X-ray ordered to verify placement.  CXR: pending.   Richardson Landry Nnaemeka Samson ACNP Acute Care Nurse Practitioner [place] Please consult Amion 04/06/2021, 12:03 PM

## 2021-04-06 NOTE — ED Provider Notes (Addendum)
[facility] EMERGENCY DEPARTMENT Provider Note   CSN: [id number] Arrival date & time: 04/06/21  0255     History Chief Complaint  Patient presents with   Shortness of Breath         Hannah French is a 80 y.o. female.  HPI     This is an 80 year old female with a history of nonischemic cardiomyopathy with EF of approximately 40%, diabetes, hypertension, COPD, peripheral vascular disease, ongoing tobacco abuse who presents with shortness of breath.  Per EMS, patient was complaining of shortness of breath and noted to be confused.  They report that the daughter was and stated she progressively worsens throughout the day.  EMS reports agitation in route and difficulty maintaining her on oxygen as she would pull her oxygen off.  She is not on home oxygen that we know of.  On my evaluation, patient cannot provide any history.  She is acutely agitated.  She is very restless.  Per EMS she was given Solu-Medrol, DuoNeb in route.  Patient's chart reviewed.  Recent evaluation by primary physician for worsening shortness of breath.  Was recently started on prednisone.  Had a chest x-ray 5 days ago.  She has a recent history of hospitalization for acute on chronic respiratory failure.  Thought to be multifactorial from CHF and COPD.  Level 5 caveat.  Past Medical History:  Diagnosis Date   Abdominal bruit    Bladder cancer ([place])    CAD (coronary artery disease)    a. nonobstructive by cath 08/2018.   Carotid bruit    Chronic combined systolic and diastolic CHF (congestive heart failure) (HCC)    CKD (chronic kidney disease), stage II    Congestive heart failure (CHF) (HCC)    Diabetes mellitus    Type II   Dyslipidemia    Heart murmur    Mild pulmonary hypertension (HCC)    Mitral valve prolapse    a. not seen on most recent echoes. Mild MR now.   NICM (nonischemic cardiomyopathy) ([place])    Osteoporosis    Pancreatitis, acute    Pseudogout    PVD (peripheral vascular  disease) ([place])    a. 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   Sinus tachycardia    Tobacco abuse     Patient Active Problem List   Diagnosis Date Noted   Syncope 02/19/2021   Acute GI bleeding 02/19/2021   Acute on chronic blood loss anemia 02/19/2021   Acute respiratory failure with hypoxia ([place]) 01/17/2021   Elevated MCV 01/17/2021   Leukocytosis 01/17/2021   CKD (chronic kidney disease), stage III ([place]) 01/17/2021   Hyperglycemia due to diabetes mellitus ([place]) 01/17/2021   Skin abscess 01/15/2020   Depression, recurrent ([place]) 08/08/2019   Cigarette smoker 06/19/2019   Vitamin D deficiency 01/29/2019   Dizziness 12/03/2018   Chronic combined systolic and diastolic CHF (congestive heart failure) ([place]) 12/03/2018   Coronary artery disease involving native coronary artery of native heart with angina pectoris ([place]) 10/04/2018   Hyperlipidemia 10/04/2018   Pain in right hand 10/02/2018   Myalgia due to statin 03/29/2018   Stage 3 severe COPD by GOLD classification ([place]) 10/26/2017   Tobacco abuse 03/07/2016   Type 2 diabetes mellitus ([place]) 08/26/2015   Osteoporosis 09/02/2014   Carotid stenosis 07/22/2012   PVD 07/02/2009   BLADDER CANCER 06/05/2009   Hyperlipidemia associated with type 2 diabetes mellitus ([place]) 06/05/2009   PSEUDOGOUT 06/05/2009   MITRAL VALVE PROLAPSE 06/05/2009  Past Surgical History:  Procedure Laterality Date   ABDOMINAL AORTOGRAM W/LOWER EXTREMITY N/A 12/31/2019   Procedure: ABDOMINAL AORTOGRAM W/LOWER EXTREMITY;  Surgeon: Wellington Hampshire, MD;  Location: [facility];  Service: Cardiovascular;  Laterality: N/A;   ABDOMINAL AORTOGRAM W/LOWER EXTREMITY N/A 09/01/2020   Procedure: ABDOMINAL AORTOGRAM W/LOWER EXTREMITY;  Surgeon: Wellington Hampshire, MD;  Location: [place] [facility];  Service: Cardiovascular;  Laterality: N/A;   APPENDECTOMY     CORONARY STENT INTERVENTION N/A 10/13/2020   Procedure:  CORONARY STENT INTERVENTION;  Surgeon: Leonie Man, MD;  Location: [facility];  Service: Cardiovascular;  Laterality: N/A;   Hysterectomy-type unspecified     INTRAVASCULAR PRESSURE WIRE/FFR STUDY N/A 10/13/2020   Procedure: INTRAVASCULAR PRESSURE WIRE/FFR STUDY;  Surgeon: Leonie Man, MD;  Location: [facility];  Service: Cardiovascular;  Laterality: N/A;   LAPAROSCOPIC CHOLECYSTECTOMY  2009   PERIPHERAL VASCULAR INTERVENTION  12/31/2019   Procedure: PERIPHERAL VASCULAR INTERVENTION;  Surgeon: Wellington Hampshire, MD;  Location: [facility];  Service: Cardiovascular;;  Bilateral Iliacs   RIGHT/LEFT HEART CATH AND CORONARY ANGIOGRAPHY N/A 09/16/2018   Procedure: RIGHT/LEFT HEART CATH AND CORONARY ANGIOGRAPHY;  Surgeon: Sherren Mocha, MD;  Location: [facility] [facility];  Service: Cardiovascular;  Laterality: N/A;   RIGHT/LEFT HEART CATH AND CORONARY ANGIOGRAPHY N/A 10/13/2020   Procedure: RIGHT/LEFT HEART CATH AND CORONARY ANGIOGRAPHY;  Surgeon: Jolaine Artist, MD;  Location: [facility];  Service: Cardiovascular;  Laterality: N/A;     OB History   No obstetric history on file.     Family History  Problem Relation Age of Onset   Heart failure Mother    Hypertension Sister    Heart attack Brother    Heart attack Maternal Uncle    Heart disease Sister    COPD Brother    Congestive Heart Failure Brother    Congestive Heart Failure Daughter    Rheum arthritis Son    Ovarian cancer Sister    Coronary artery disease Neg Hx        Early    Social History   Tobacco Use   Smoking status: Every Day    Packs/day: 1.00    Years: 50.00    Pack years: 50.00    Types: Cigarettes   Smokeless tobacco: Never   Tobacco comments:    still smoking as of 03/01/21  Vaping Use   Vaping Use: Never used  Substance Use Topics   Alcohol use: No   Drug use: No    Home Medications Prior to Admission medications   Medication Sig Start Date End Date Taking?  Authorizing Provider  acetaminophen (TYLENOL) 500 MG tablet Take 500 mg by mouth every 6 (six) hours as needed for moderate pain.    [provider]  albuterol (VENTOLIN HFA) 108 (90 Base) MCG/ACT inhaler Inhale 2 puffs into the lungs every 6 (six) hours as needed for wheezing or shortness of breath. 02/02/21   Martyn Ehrich, NP  Cholecalciferol (VITAMIN D) 50 MCG (2000 UT) tablet Take 4,000 Units by mouth daily.     [provider]  clopidogrel (PLAVIX) 75 MG tablet Take 1 tablet (75 mg total) by mouth daily. 10/13/20   Cheryln Manly, NP  ferrous sulfate 325 (65 FE) MG tablet Take 1 tablet (325 mg total) by mouth daily with breakfast. 04/01/21 05/01/21  Dettinger, Fransisca Kaufmann, MD  Fluticasone-Umeclidin-Vilant (TRELEGY ELLIPTA) 100-62.5-25 MCG/INH AEPB Inhale 1 puff into the lungs  daily. 02/02/21   Martyn Ehrich, NP  furosemide (LASIX) 20 MG tablet Take 2 tablets daily. You may take an extra dose for increase weight gain orswelling 09/09/20   Dettinger, Fransisca Kaufmann, MD  glimepiride (AMARYL) 2 MG tablet Take 1-2 tablets (2-4 mg total) by mouth daily with breakfast. 09/09/20   Dettinger, Fransisca Kaufmann, MD  Glucose Blood (BLOOD GLUCOSE TEST STRIPS) STRP 1 strip by In Vitro route 2 (two) times daily. 04/01/21   Dettinger, Fransisca Kaufmann, MD  insulin aspart (NOVOLOG) 100 UNIT/ML injection Inject 5-20 Units into the skin 3 (three) times daily before meals. 02/02/21   Dettinger, Fransisca Kaufmann, MD  insulin degludec (TRESIBA FLEXTOUCH) 100 UNIT/ML FlexTouch Pen Inject 10 Units into the skin daily. 09/09/20   Dettinger, Fransisca Kaufmann, MD  levalbuterol (XOPENEX) 0.63 MG/3ML nebulizer solution NEBULIZE 1 VIAL EVERY 4 HOURS AS NEEDED FOR WHEEZING OR SHORTNESS OF BREATH 03/11/21   Tanda Rockers, MD  metolazone (ZAROXOLYN) 2.5 MG tablet Take 1 tablet (2.5 mg total) by mouth as directed. By HF clinic 04/04/21   Bensimhon, Shaune Pascal, MD  metoprolol succinate (TOPROL-XL) 25 MG 24 hr tablet TAKE 1 TABLET DAILY 03/16/21    Bensimhon, Shaune Pascal, MD  Naphazoline-Pheniramine (OPCON-A) 0.027-0.315 % SOLN Place 1 drop into both eyes daily as needed (Burning and itching eyes).    [provider]  nitroGLYCERIN (NITRODUR - DOSED IN MG/24 HR) 0.2 mg/hr patch Place 1 patch (0.2 mg total) onto the skin daily. 11/02/20   Newt Minion, MD  nitroGLYCERIN (NITROSTAT) 0.3 MG SL tablet Place 1 tablet (0.3 mg total) under the tongue every 5 (five) minutes as needed for chest pain. 02/02/21 02/02/22  Dettinger, Fransisca Kaufmann, MD  pantoprazole (PROTONIX) 40 MG tablet Take 1 tablet (40 mg total) by mouth 2 (two) times daily for 20 days. 02/22/21 03/23/22  Shahmehdi, Valeria Batman, MD  pentoxifylline (TRENTAL) 400 MG CR tablet TAKE  (1)  TABLET  THREE TIMES DAILY WITH MEALS. 03/10/21   Newt Minion, MD  potassium chloride SA (KLOR-CON) 20 MEQ tablet Take 2 tablets (40 mEq total) by mouth as directed. Only when you take Metolazone 04/04/21   Bensimhon, Shaune Pascal, MD  predniSONE (DELTASONE) 20 MG tablet Take 3 tabs daily for 1 week, then 2 tabs daily for week 2, then 1 tab daily for week 3. 04/01/21   Dettinger, Fransisca Kaufmann, MD  sennosides-docusate sodium (SENOKOT-S) 8.6-50 MG tablet Take 1 tablet by mouth daily. 04/04/21   Dettinger, Fransisca Kaufmann, MD    Allergies    Contrast media [iodinated diagnostic agents], Iohexol, Metformin, Statins, Bextra [valdecoxib], Cefdinir, Cozaar [losartan potassium], Gabapentin, Nsaids, Zetia [ezetimibe], Rofecoxib, and Sulfa antibiotics  Review of Systems   Review of Systems  Unable to perform ROS: Mental status change   Physical Exam Updated Vital Signs BP 132/63   Pulse (!) 115   Temp (!) 96.8 F (36 C) (Bladder)   Resp 18   Ht 1.499 m (4\' 11" )   Wt 51 kg   SpO2 100%   BMI 22.71 kg/m   Physical Exam Vitals and nursing note reviewed.  Constitutional:      General: She is in acute distress.     Appearance: She is ill-appearing and toxic-appearing.  HENT:     Head: Normocephalic and atraumatic.      Mouth/Throat:     Mouth: Mucous membranes are moist.  Eyes:     Pupils: Pupils are equal, round, and reactive to light.  Cardiovascular:  Rate and Rhythm: Regular rhythm. Tachycardia present.     Heart sounds: Normal heart sounds.  Pulmonary:     Effort: Tachypnea and accessory muscle usage present. No respiratory distress.     Breath sounds: Wheezing present.     Comments: Increased work of breathing, accessory muscle use, tripoding Abdominal:     General: Bowel sounds are normal.     Palpations: Abdomen is soft.  Musculoskeletal:     Cervical back: Neck supple.     Comments: Trace bilateral lower extremity edema  Skin:    General: Skin is dry.     Coloration: Skin is cyanotic.     Comments: Feet cool and pale, toes cyanotic  Neurological:     Comments: Moves all 4 extremities, cannot assess orientation  Psychiatric:     Comments: Acutely agitated    ED Results / Procedures / Treatments   Labs (all labs ordered are listed, but only abnormal results are displayed) Labs Reviewed  CBC WITH DIFFERENTIAL/PLATELET - Abnormal; Notable for the following components:      Result Value   WBC 16.1 (*)    RBC 3.75 (*)    Hemoglobin 11.9 (*)    MCV 107.2 (*)    MCHC 29.6 (*)    RDW 24.0 (*)    Platelets 651 (*)    nRBC 1.7 (*)    Neutro Abs 12.3 (*)    Monocytes Absolute 1.3 (*)    Abs Immature Granulocytes 0.42 (*)    All other components within normal limits  BASIC METABOLIC PANEL - Abnormal; Notable for the following components:   Glucose, Bld 496 (*)    BUN 56 (*)    Creatinine, Ser 2.13 (*)    Calcium 8.7 (*)    GFR, Estimated 23 (*)    All other components within normal limits  BRAIN NATRIURETIC PEPTIDE - Abnormal; Notable for the following components:   B Natriuretic Peptide 918.0 (*)    All other components within normal limits  BLOOD GAS, ARTERIAL - Abnormal; Notable for the following components:   pH, Arterial 7.083 (*)    pCO2 arterial 74.8 (*)    pO2,  Arterial 166 (*)    Bicarbonate 16.9 (*)    Acid-base deficit 7.0 (*)    All other components within normal limits  LACTIC ACID, PLASMA - Abnormal; Notable for the following components:   Lactic Acid, Venous 4.2 (*)    All other components within normal limits  CBG MONITORING, ED - Abnormal; Notable for the following components:   Glucose-Capillary 541 (*)    All other components within normal limits  RESP PANEL BY RT-PCR (FLU A&B, COVID) ARPGX2  CULTURE, BLOOD (ROUTINE X 2)  CULTURE, BLOOD (ROUTINE X 2)  LACTIC ACID, PLASMA  BLOOD GAS, ARTERIAL  TROPONIN I (HIGH SENSITIVITY)  TROPONIN I (HIGH SENSITIVITY)    EKG EKG Interpretation  Date/Time:  Wednesday April 06 2021 03:03:55 EDT Ventricular Rate:  142 PR Interval:  93 QRS Duration: 137 QT Interval:  315 QTC Calculation: 485 R Axis:   194 Text Interpretation: Sinus tachycardia Ventricular premature complex Nonspecific intraventricular conduction delay Artifact in lead(s) I II III aVR aVL aVF Confirmed by Thayer Jew [phone]) on 04/06/2021 3:17:56 AM  Radiology DG Chest 1 View  Result Date: 04/06/2021 CLINICAL DATA:  80 year old female intubated. Shortness of breath and tachycardia. EXAM: CHEST  1 VIEW COMPARISON:  Portable chest 0322 hours today and earlier. FINDINGS: Portable AP supine view at 0404 hours. Intubated with endotracheal  tube tip in good position between the clavicles and carina. Enteric tube courses to the abdomen and side hole is at the level of the gastric fundus. Mediastinal contours are stable and within normal limits. Coarse bilateral pulmonary interstitial opacity persists, progressed since 04/01/2021. No pneumothorax, pleural effusion or consolidation. Stable visualized osseous structures. IMPRESSION: 1. Endotracheal tube and enteric tube in good position. 2. Ongoing coarse bilateral pulmonary interstitial opacity, acute new since 04/01/2021, with differential considerations of viral/atypical pneumonia versus  pulmonary edema. Electronically Signed   By: Genevie Ann M.D.   On: 04/06/2021 04:18   DG Chest Portable 1 View  Result Date: 04/06/2021 CLINICAL DATA:  Shortness of breath, confusion, tachycardia EXAM: PORTABLE CHEST 1 VIEW COMPARISON:  Radiograph 04/01/2021 FINDINGS: Markedly increased coarse reticulonodular opacities are present throughout both lungs with indistinct pulmonary vascularity. No pneumothorax or visible effusion. No focal consolidative process is seen. Stable cardiomediastinal contours with a calcified aorta. No acute osseous or soft tissue abnormality. IMPRESSION: Interval development of diffuse heterogeneous reticular opacities throughout the lungs with peripheral septal thickening. Rapid development favoring pulmonary edema versus atypical infection/interstitial inflammation. Aortic Atherosclerosis (ICD10-I70.0). Electronically Signed   By: Lovena Le M.D.   On: 04/06/2021 03:31    Procedures .Critical Care  Date/Time: 04/06/2021 4:09 AM Performed by: Merryl Hacker, MD Authorized by: Merryl Hacker, MD   Critical care provider statement:    Critical care time (minutes):  70   Critical care was time spent personally by me on the following activities:  Discussions with consultants, evaluation of patient's response to treatment, examination of patient, ordering and performing treatments and interventions, ordering and review of laboratory studies, ordering and review of radiographic studies, pulse oximetry, re-evaluation of patient's condition, obtaining history from patient or surrogate and review of old charts Procedure Name: Intubation Date/Time: 04/06/2021 4:09 AM Performed by: Merryl Hacker, MD Pre-anesthesia Checklist: Patient identified, Patient being monitored, Emergency Drugs available, Timeout performed and Suction available Oxygen Delivery Method: Non-rebreather mask Preoxygenation: Pre-oxygenation with 100% oxygen Induction Type: Rapid sequence Ventilation:  Mask ventilation without difficulty Laryngoscope Size: Glidescope and 3 Tube size: 7.5 mm Number of attempts: 1 Placement Confirmation: ETT inserted through vocal cords under direct vision, CO2 detector and Breath sounds checked- equal and bilateral Secured at: 21 cm Tube secured with: ETT holder Dental Injury: Teeth and Oropharynx as per pre-operative assessment       Medications Ordered in ED Medications  LORazepam (ATIVAN) injection 1 mg (1 mg Intravenous Not Given 04/06/21 0311)  LORazepam (ATIVAN) 2 MG/ML injection (  Not Given 04/06/21 0310)  albuterol (PROVENTIL,VENTOLIN) solution continuous neb (10 mg/hr Nebulization Not Given 04/06/21 0456)  propofol (DIPRIVAN) 1000 MG/100ML infusion (70 mcg/kg/min  51 kg Intravenous Rate/Dose Change 04/06/21 0556)  insulin regular, human (MYXREDLIN) 100 units/ 100 mL infusion (8 Units/hr Intravenous New Bag/Given 04/06/21 0549)  lactated ringers infusion (has no administration in time range)  dextrose 5 % in lactated ringers infusion (has no administration in time range)  dextrose 50 % solution 0-50 mL (has no administration in time range)  LORazepam (ATIVAN) injection 1 mg (1 mg Intravenous Given 04/06/21 0310)  ipratropium (ATROVENT) nebulizer solution 0.5 mg (0.5 mg Nebulization Given 04/06/21 0456)  vancomycin (VANCOCIN) IVPB 1000 mg/200 mL premix (0 mg Intravenous Stopped 04/06/21 0515)  aztreonam (AZACTAM) 2 g in sodium chloride 0.9 % 100 mL IVPB (0 g Intravenous Stopped 04/06/21 0445)  midazolam (VERSED) injection 2 mg (2 mg Intravenous Given 04/06/21 0420)  albuterol (PROVENTIL) (2.5 MG/3ML)  0.083% nebulizer solution (10 mg  Given 04/06/21 0455)  furosemide (LASIX) injection 40 mg (40 mg Intravenous Given 04/06/21 0543)  midazolam (VERSED) injection 2 mg (2 mg Intravenous Given 04/06/21 0543)    ED Course  I have reviewed the triage vital signs and the nursing notes.  Pertinent labs & imaging results that were available during my care of the  patient were reviewed by me and considered in my medical decision making (see chart for details).  Clinical Course as of 04/06/21 0606  Wed Apr 06, 2021  0529 Spoke with critical care.  Recommends repeat ABG.  Feels that chest x-ray is most consistent with probable pulmonary edema.  Request stopping fluid resuscitation per sepsis protocol given favoring pulmonary edema over acute infection and giving a diuretic.  I feel this is reasonable given presentation. [CH]  0544 Repeat ABG ordered.  If improving, critical care feels patient can be admitted to [facility] ICU. [place] Hospitalist consulted for admission.  Gave details to Dr. Sidney Ace via secure chat.  Plan for AP ICU admit. [CH]    Clinical Course User Index [CH] Bennetta Rudden, Barbette Hair, MD   MDM Rules/Calculators/A&P                          Patient presents with shortness of breath.  She appears to be in respiratory distress on my initial evaluation.  She is tachypneic, tripoding, using accessory muscles.  She is very agitated and does not provide any history.  She is satting well on a nonrebreather.  I suspect she may have been hypoxic and/or hypercapnic while at home.  I spoke with patient's daughter.  She is full code.  Decision was made to intubate given work of breathing.  She is not a candidate for BiPAP.  She has a history of both COPD and CHF.  She does not appear overtly volume overloaded peripherally.  Daughter reports she was recently increased on diuretic.  She is also on steroid for COPD exacerbation.  Chest x-ray shows worsening diffuse disease with differential including atypical infection versus pulmonary edema.  Again, COPD is also consideration.  Given the acuity of her condition, sepsis work-up was also initiated.  Lactate is 4.2.  She is not hypotensive.  This may represent her work bleeding.  However, will cover with antibiotics as she also has a leukocytosis to 16 and a left shift.  BNP is elevated.  Troponin is reassuring.  EKG  shows evidence of sinus tachycardia.  Patient's initial presentation was most consistent with likely COPD given her clinical picture.  However, there is some evidence of potential volume overload with elevated BNP and/or infection based on chest x-ray findings.  Likely multifactorial.  Similar presentation in the past.  COVID testing is negative.  We will plan for admission.  Will discuss with intensivist.  5:45 AM ABG with a pH of 7.083 and a PCO2 of 74.8. Concerning for primary respiratory acidosis and a secondary metabolic acidosis Final Clinical Impression(s) / ED Diagnoses Final diagnoses:  Chronic obstructive pulmonary disease with acute exacerbation (HCC)  History of heart failure  AKI (acute kidney injury) ([place])  Severe sepsis (HCC)  Hyperglycemia  Acute on chronic respiratory failure with hypoxia and hypercapnia [facility])    Rx / DC Orders ED Discharge Orders     None        Waymon Laser, Barbette Hair, MD 04/06/21 4010    Merryl Hacker, MD 04/06/21 0510  Merryl Hacker, MD 04/06/21 [phone]

## 2021-04-06 NOTE — ED Notes (Signed)
Triage vitals unattainable at this time. RN, Respirtory and MD at bedside

## 2021-04-06 NOTE — Progress Notes (Signed)
Pt suddenly woke up during  mouth care and tried to pull out all contraption and tried to sit and moved out of bed, not following command, 2 staff came to control the pt, fentanyl infusion increased from 50 mcg/hr and Precedex to 1.2 mcg/kg/hr from  0.8 mcg/kg/hr, spoke with CCM via video call updated about latest pt condition like v/s and the response of pt as above, agreed with the management and said if pt still move as above, plan to put pt on restraint

## 2021-04-06 NOTE — ED Notes (Signed)
Date and time results received: 04/06/21 0945 (use smartphrase ".now" to insert current time)  Test: Lactic Acid Critical Value: 4.7  Name of Provider Notified: Manuella Ghazi, MD  Orders Received? Or Actions Taken?:

## 2021-04-06 NOTE — ED Notes (Signed)
Date and time results received: 04/06/21 0503  Test: arterial pH, arterial CO2 Critical Value: pH 7.083, pCO2 74.8  Name of Provider Notified: Dr. Dina Rich  Orders Received? Or Actions Taken?: see chart

## 2021-04-06 NOTE — Consult Note (Addendum)
Advanced Heart Failure Team Consult Note   Primary Physician: Dettinger, Fransisca Kaufmann, MD PCP-Cardiologist:  Glori Bickers, MD  Reason for Consultation: Acute on chronic heart failure   HPI:    Hannah French is seen today for evaluation of acute on chronic heart failure at the request of Dr Ina Homes.  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 She is currently intubated and sedated and therefore cannot contribute to this HPI, but per her daughter at bedside, she began feeling short of breath several days ago, but she worsened over the past couple of days.  After becoming short of breath, she became confused, prompting her daughter to call EMS.  Several days ago, she visited her PCP, who prescribed steroids for a COPD exacerbation; however, her daughter reports that the patient did not take them as prescribed. Addtionally, her cardiologist (Dr. Haroldine Laws) prescribed additional diuretic therapy for her two days ago for worsening lower extremity edema.  Her daughter also reports a weight gain of about 12 pounds over the past week.  In the ED, she became agitated and hypoxic with an ABG of 7.08/74.8/166/7/16.9 and she was subsequently intubated.  A CXR found coarse bilateral pulmonary interstitial opacities concerning for atypical pneumonia vs pulmonary edema.  She received a dose of IV Lasix and was started on antibiotics.  Labs were remarkable for a WBC of 16>>22, a creatinine of 2.13>>2.37 (baseline appears to be around 1), a glucose level of 496,  a BNP of 918 and a lactate of 4.7.  She was then transferred [facility] for further evaluation and management.   Echo from 10/21/2020:  1. Left ventricular ejection fraction, by estimation, is 40 to 45%. The   left ventricle has mildly decreased function. The left ventricle  demonstrates regional wall motion abnormalities (see scoring  diagram/findings for description). Left ventricular  diastolic parameters are consistent with Grade I diastolic dysfunction  (impaired relaxation). There is hypokinesis of the left ventricular,  entire inferoseptal wall, inferior wall, septal wall and anteroseptal  wall.   2. Right ventricular systolic function is normal. The right ventricular  size is normal.   3. Left atrial size was mildly dilated.   4. The mitral valve is normal in structure. Trivial mitral valve  regurgitation. No evidence of mitral stenosis.   5. The aortic valve is normal in structure. Aortic valve regurgitation is  not visualized. No aortic stenosis is present.   6. The inferior vena cava is normal in size with greater than 50%  respiratory variability, suggesting right atrial pressure of 3 mmHg.    Review of Systems: [y] = yes, [ ] = no  Unobtainable due to current clinical condition.  Home Medications Prior to Admission medications   Medication Sig Start Date End Date Taking? Authorizing Provider  acetaminophen (TYLENOL) 500 MG tablet Take 500 mg by mouth every 6 (six) hours as needed for moderate pain.   Yes [provider]  albuterol (VENTOLIN HFA) 108 (90 Base) MCG/ACT inhaler Inhale 2 puffs into the lungs every 6 (six) hours as needed for wheezing or shortness of breath. 02/02/21  Yes Martyn Ehrich, NP  Cholecalciferol (VITAMIN D) 50 MCG (2000 UT) tablet Take 4,000 Units by mouth daily.    Yes [provider]  clopidogrel (PLAVIX) 75 MG tablet Take 1 tablet (75 mg total) by mouth daily. 10/13/20  Yes Reino Bellis B, NP  ferrous sulfate 325 (65 FE) MG tablet Take 1 tablet (325 mg total) by mouth daily with breakfast. 04/01/21 05/01/21 Yes Dettinger, Fransisca Kaufmann, MD  Fluticasone-Umeclidin-Vilant (TRELEGY ELLIPTA) 100-62.5-25 MCG/INH AEPB Inhale 1 puff into the  lungs daily. 02/02/21  Yes Martyn Ehrich, NP  furosemide (LASIX) 20 MG tablet Take 2 tablets daily. You may take an extra dose for increase weight gain orswelling 09/09/20  Yes Dettinger, Fransisca Kaufmann, MD  glimepiride (AMARYL) 2 MG tablet Take 1-2 tablets (2-4 mg total) by mouth daily with breakfast. 09/09/20  Yes Dettinger, Fransisca Kaufmann, MD  Glucose Blood (BLOOD GLUCOSE TEST STRIPS) STRP 1 strip by In Vitro route 2 (two) times daily. 04/01/21  Yes Dettinger, Fransisca Kaufmann, MD  insulin aspart (NOVOLOG) 100 UNIT/ML injection Inject 5-20 Units into the skin 3 (three) times daily before meals. 02/02/21  Yes Dettinger, Fransisca Kaufmann, MD  insulin degludec (TRESIBA FLEXTOUCH) 100 UNIT/ML FlexTouch Pen Inject 10 Units into the skin daily. 09/09/20  Yes Dettinger, Fransisca Kaufmann, MD  levalbuterol (XOPENEX) 0.63 MG/3ML nebulizer solution NEBULIZE 1 VIAL EVERY 4 HOURS AS NEEDED FOR WHEEZING OR SHORTNESS OF BREATH 03/11/21  Yes Tanda Rockers, MD  metolazone (ZAROXOLYN) 2.5 MG tablet Take 1 tablet (2.5 mg total) by mouth as directed. By HF clinic 04/04/21  Yes , Shaune Pascal, MD  metoprolol succinate (TOPROL-XL) 25 MG 24 hr tablet TAKE 1 TABLET DAILY 03/16/21  Yes , Shaune Pascal, MD  Naphazoline-Pheniramine (OPCON-A) 0.027-0.315 % SOLN Place 1 drop into both eyes daily as needed (Burning and itching eyes).   Yes [provider]  nitroGLYCERIN (NITRODUR - DOSED IN MG/24 HR) 0.2 mg/hr patch Place 1 patch (0.2 mg total) onto the skin daily. 11/02/20  Yes Newt Minion, MD  nitroGLYCERIN (NITROSTAT) 0.3 MG SL tablet Place 1 tablet (0.3 mg total) under the tongue every 5 (five) minutes as needed for chest pain. 02/02/21 02/02/22 Yes Dettinger, Fransisca Kaufmann, MD  pantoprazole (PROTONIX) 40 MG tablet Take 1 tablet (40 mg total) by mouth 2 (two) times daily for 20 days. 02/22/21 03/23/22 Yes Shahmehdi, Seyed A, MD  pentoxifylline (TRENTAL) 400 MG CR tablet TAKE  (1)  TABLET  THREE TIMES DAILY WITH MEALS. 03/10/21  Yes Newt Minion, MD   potassium chloride SA (KLOR-CON) 20 MEQ tablet Take 2 tablets (40 mEq total) by mouth as directed. Only when you take Metolazone 04/04/21  Yes , Shaune Pascal, MD  predniSONE (DELTASONE) 20 MG tablet Take 3 tabs daily for 1 week, then 2 tabs daily for week 2, then 1 tab daily for week 3. 04/01/21  Yes Dettinger, Fransisca Kaufmann, MD  sennosides-docusate sodium (SENOKOT-S) 8.6-50 MG tablet Take 1 tablet by mouth daily. 04/04/21  Yes Dettinger, Fransisca Kaufmann, MD    Past Medical History: Past Medical History:  Diagnosis Date   Abdominal bruit    Bladder cancer ([place])    CAD (coronary artery disease)    a. nonobstructive by cath 08/2018.   Carotid bruit    Chronic combined systolic and diastolic CHF (congestive heart failure) (HCC)    CKD (chronic kidney disease), stage II    Congestive heart failure (CHF) (HCC)    Diabetes mellitus    Type II   Dyslipidemia    Heart murmur    Mild pulmonary hypertension (HCC)    Mitral valve prolapse    a.  not seen on most recent echoes. Mild MR now.   NICM (nonischemic cardiomyopathy) ([place])    Osteoporosis    Pancreatitis, acute    Pseudogout    PVD (peripheral vascular disease) ([place])    a. 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   Sinus tachycardia    Tobacco abuse     Past Surgical History: Past Surgical History:  Procedure Laterality Date   ABDOMINAL AORTOGRAM W/LOWER EXTREMITY N/A 12/31/2019   Procedure: ABDOMINAL AORTOGRAM W/LOWER EXTREMITY;  Surgeon: Wellington Hampshire, MD;  Location: [facility];  Service: Cardiovascular;  Laterality: N/A;   ABDOMINAL AORTOGRAM W/LOWER EXTREMITY N/A 09/01/2020   Procedure: ABDOMINAL AORTOGRAM W/LOWER EXTREMITY;  Surgeon: Wellington Hampshire, MD;  Location: [facility];  Service: Cardiovascular;  Laterality: N/A;   APPENDECTOMY     CORONARY STENT INTERVENTION N/A 10/13/2020   Procedure: CORONARY STENT INTERVENTION;  Surgeon: Leonie Man, MD;  Location: [place] [facility];  Service: Cardiovascular;  Laterality: N/A;   Hysterectomy-type unspecified     INTRAVASCULAR PRESSURE WIRE/FFR STUDY N/A 10/13/2020   Procedure: INTRAVASCULAR PRESSURE WIRE/FFR STUDY;  Surgeon: Leonie Man, MD;  Location: [place];  Service: Cardiovascular;  Laterality: N/A;   LAPAROSCOPIC CHOLECYSTECTOMY  2009   PERIPHERAL VASCULAR INTERVENTION  12/31/2019   Procedure: PERIPHERAL VASCULAR INTERVENTION;  Surgeon: Wellington Hampshire, MD;  Location: [facility];  Service: Cardiovascular;;  Bilateral Iliacs   RIGHT/LEFT HEART CATH AND CORONARY ANGIOGRAPHY N/A 09/16/2018   Procedure: RIGHT/LEFT HEART CATH AND CORONARY ANGIOGRAPHY;  Surgeon: Sherren Mocha, MD;  Location: [place] [facility];  Service: Cardiovascular;  Laterality: N/A;   RIGHT/LEFT HEART CATH AND CORONARY ANGIOGRAPHY N/A 10/13/2020   Procedure: RIGHT/LEFT HEART CATH AND CORONARY ANGIOGRAPHY;  Surgeon: Jolaine Artist, MD;  Location: [facility];  Service: Cardiovascular;  Laterality: N/A;    Family History: Family History  Problem Relation Age of Onset   Heart failure Mother    Hypertension Sister    Heart attack Brother    Heart attack Maternal Uncle    Heart disease Sister    COPD Brother    Congestive Heart Failure Brother    Congestive Heart Failure Daughter    Rheum arthritis Son    Ovarian cancer Sister    Coronary artery disease Neg Hx        Early    Social History: Social History   Socioeconomic History   Marital status: Divorced    Spouse name: Not on file   Number of children: 2   Years of education: Some College    Highest education level: Some college, no degree  Occupational History   Occupation: Retired  Tobacco Use   Smoking status: Every Day    Packs/day: 1.00    Years: 50.00    Pack years: 50.00    Types: Cigarettes   Smokeless tobacco: Never   Tobacco comments:    still smoking as of 03/01/21  Vaping Use   Vaping Use: Never used  Substance  and Sexual Activity   Alcohol use: No   Drug use: No   Sexual activity: Not Currently  Other Topics Concern   Not on file  Social History Narrative   Not on file   Social Determinants of Health   Financial Resource Strain: Not on file  Food Insecurity: No Food Insecurity   Worried About [place] in the Last Year: Never true  Ran Out of Food in the Last Year: Never true  Transportation Needs: No Transportation Needs   Lack of Transportation (Medical): No   Lack of Transportation (Non-Medical): No  Physical Activity: Not on file  Stress: Not on file  Social Connections: Not on file    Allergies:  Allergies  Allergen Reactions   Contrast Media [Iodinated Diagnostic Agents] Shortness Of Breath   Iohexol Other (See Comments)    PASSED OUT DURING THE TEST     Metformin Swelling    Face and hands became swollen   Statins Swelling and Other (See Comments)    They make the patient hurt "all over" Myalgia, also   Bextra [Valdecoxib] Nausea And Vomiting and Swelling    Shut down my kidneys    Cefdinir Other (See Comments)    Pancreatitis    Cozaar [Losartan Potassium] Other (See Comments)    dizziness   Gabapentin    Nsaids Other (See Comments)    GI intolerance and pain all over, tolerates ibu    Zetia [Ezetimibe] Swelling   Rofecoxib Other (See Comments)    "VIOXX"- "SHUT DOWN MY KIDNEYS"   Sulfa Antibiotics Nausea And Vomiting and Other (See Comments)    Pancreatitis    Objective:    Vital Signs:   Temp:  [92.8 F (33.8 C)-97.5 F (36.4 C)] 97.3 F (36.3 C) (07/13 1000) Pulse Rate:  [83-140] 83 (07/13 1115) Resp:  [14-33] 22 (07/13 1115) BP: (91-174)/(43-97) 110/51 (07/13 1115) SpO2:  [91 %-100 %] 91 % (07/13 1115) FiO2 (%):  [40 %-60 %] 40 % (07/13 1115) Weight:  [51 kg] 51 kg (07/13 0300)    Weight change: Filed Weights   04/06/21 0300  Weight: 51 kg    Intake/Output:   Intake/Output Summary (Last 24 hours) at 04/06/2021 1153 Last  data filed at 04/06/2021 1006 Gross per 24 hour  Intake --  Output 250 ml  Net -250 ml      Physical Exam    General:  Intubated and sedated  HEENT: normal Neck: supple. Carotids 2+ bilat; no bruits. No lymphadenopathy or thyromegaly appreciated. Cor: PMI nondisplaced. Regular rate & rhythm. No rubs, gallops or murmurs. Lungs: Diminished. Mechanically ventilated  Abdomen: soft, nontender, nondistended. No hepatosplenomegaly. No bruits or masses. Good bowel sounds. Extremities: no cyanosis, clubbing, rash. Trace edema to BLE  Neuro: Sedated on Versed   Telemetry   SR in 90s.  Personally reviewed   EKG    SR with LBBB and left axis deviation   Labs   Basic Metabolic Panel: Recent Labs  Lab 04/01/21 1450 04/06/21 0347 04/06/21 0857  NA 140 136  --   K 4.5 4.9  --   CL 97 98  --   CO2 27 27  --   GLUCOSE 151* 496*  --   BUN 29* 56*  --   CREATININE 1.53* 2.13* 2.37*  CALCIUM 9.6 8.7*  --     Liver Function Tests: Recent Labs  Lab 04/01/21 1450  AST 13  ALT 10  ALKPHOS 64  BILITOT 0.6  PROT 6.4  ALBUMIN 4.4   No results for input(s): LIPASE, AMYLASE in the last 168 hours. No results for input(s): AMMONIA in the last 168 hours.  CBC: Recent Labs  Lab 04/01/21 1450 04/06/21 0347 04/06/21 0857  WBC 11.2* 16.1* 22.4*  NEUTROABS 8.2* 12.3*  --   HGB 11.1 11.9* 10.1*  HCT 33.9* 40.2 34.1*  MCV 95 107.2* 106.6*  PLT 448 651* 455*  Cardiac Enzymes: No results for input(s): CKTOTAL, CKMB, CKMBINDEX, TROPONINI in the last 168 hours.  BNP: BNP (last 3 results) Recent Labs    01/19/21 0522 02/19/21 1943 04/06/21 0347  BNP 288.0* 179.0* 918.0*    ProBNP (last 3 results) No results for input(s): PROBNP in the last 8760 hours.   CBG: Recent Labs  Lab 04/06/21 0802 04/06/21 0829 04/06/21 0903 04/06/21 0932 04/06/21 1009  GLUCAP 505* 464* 451* 393* 338*    Coagulation Studies: No results for input(s): LABPROT, INR in the last 72  hours.   Imaging   DG Chest 1 View  Result Date: 04/06/2021 CLINICAL DATA:  80 year old female intubated. Shortness of breath and tachycardia. EXAM: CHEST  1 VIEW COMPARISON:  Portable chest 0322 hours today and earlier. FINDINGS: Portable AP supine view at 0404 hours. Intubated with endotracheal tube tip in good position between the clavicles and carina. Enteric tube courses to the abdomen and side hole is at the level of the gastric fundus. Mediastinal contours are stable and within normal limits. Coarse bilateral pulmonary interstitial opacity persists, progressed since 04/01/2021. No pneumothorax, pleural effusion or consolidation. Stable visualized osseous structures. IMPRESSION: 1. Endotracheal tube and enteric tube in good position. 2. Ongoing coarse bilateral pulmonary interstitial opacity, acute new since 04/01/2021, with differential considerations of viral/atypical pneumonia versus pulmonary edema. Electronically Signed   By: Genevie Ann M.D.   On: 04/06/2021 04:18   DG Chest Portable 1 View  Result Date: 04/06/2021 CLINICAL DATA:  Shortness of breath, confusion, tachycardia EXAM: PORTABLE CHEST 1 VIEW COMPARISON:  Radiograph 04/01/2021 FINDINGS: Markedly increased coarse reticulonodular opacities are present throughout both lungs with indistinct pulmonary vascularity. No pneumothorax or visible effusion. No focal consolidative process is seen. Stable cardiomediastinal contours with a calcified aorta. No acute osseous or soft tissue abnormality. IMPRESSION: Interval development of diffuse heterogeneous reticular opacities throughout the lungs with peripheral septal thickening. Rapid development favoring pulmonary edema versus atypical infection/interstitial inflammation. Aortic Atherosclerosis (ICD10-I70.0). Electronically Signed   By: Lovena Le M.D.   On: 04/06/2021 03:31     Medications:     Current Medications:  albuterol  10 mg/hr Nebulization Once   budesonide       budesonide  (PULMICORT) nebulizer solution  0.25 mg Nebulization BID   chlorhexidine gluconate (MEDLINE KIT)  15 mL Mouth Rinse BID   Chlorhexidine Gluconate Cloth  6 each Topical Daily   heparin  5,000 Units Subcutaneous Q8H   ipratropium-albuterol  3 mL Nebulization Q6H   LORazepam       LORazepam  1 mg Intravenous Once   mouth rinse  15 mL Mouth Rinse 10 times per day   methylPREDNISolone (SOLU-MEDROL) injection  40 mg Intravenous Q12H   pantoprazole (PROTONIX) IV  40 mg Intravenous Q24H   sodium chloride flush  3 mL Intravenous Q12H    Infusions:  sodium chloride     sodium chloride 75 mL/hr at 04/06/21 1002   azithromycin     dextrose 5% lactated ringers     fentaNYL infusion INTRAVENOUS 75 mcg/hr (04/06/21 1000)   insulin 13 Units/hr (04/06/21 0934)   midazolam 1 mg/hr (04/06/21 0934)      Patient Profile   The patient is an 80 y/o Caucasian female with a medical history significant for 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  Workup is concerning for acute on chronic  heart failure with superimposed pneumonia.  Assessment/Plan   Acute respiratory failure - Likely multifactorial and related to acute on chronic HF, COPD exacerbation and possible superimposed pneumonia - Received one dose of IV Lasix in the ED and will give another dose now.  Monitor BP closely  . Close monitoring of intake and output and CVP -- currently 11-12  - Antibiotics per primary team - Mechanical ventilation per primary team   Acute on chronic HFrEF Nonischemic cardiomyopathy  - Last echo in January found an EF of 40 to 45 percent. Repeat echo pending  - Lasix as aforementioned - Recently prescribed doses of metolazone due to lower extremity edema  - Daily weights - CXR in AM  - Hold metoprolol due to borderline blood pressures  - Repeat echo will  guide further management   COPD exacerbation - Management per primary team -- steroids, antibiotics, breathing treatments as ordered  Pneumonia - CXR concerning for pneumonia - Antibiotics and management at discretion of primary team   Lactic acidosis - Initially elevated to 4.7 -- suspect improvement as respiratory status improves - Abx and other management at discretion of primary team  - Procalcitonin <0.1   AKI on CKD - Cr elevated to 2.37 with baseline around 1 - Daughter reports normal PO intake  - Suspect r/t hypoperfusion with borderline blood pressures and volume overload  - Monitor closely   Diabetes with hyperglycemia - Glucose of 496 shortly after initial presentation - Continue insulin drip   PVD - Continue Plavix  GERD - Continue PPI   Tobacco abuse - Cessation counseling once extubated   Medication concerns reviewed with patient and pharmacy team. Barriers identified: None   Length of Stay: 0  Darcella Gasman, NP  04/06/2021, 11:53 AM  Advanced Heart Failure Team Pager [phone] (M-F; 7a - 5p)  Please contact [facility] for night-coverage after hours (4p -7a ) and weekends on [email or web address]   Patient seen and examined with the above-signed Advanced Practice Provider and/or Housestaff. I personally reviewed laboratory data, imaging studies and relevant notes. I independently examined the patient and formulated the important aspects of the plan. I have edited the note to reflect any of my changes or salient points. I have personally discussed the plan with the patient and/or family.  80 y/o woman with chronicHFmrEF due to NICM, severe COPD with ongoing tobacco use. Admitted with acute on chronic respiratory failure and AKI. Now intubated. CXR suggestive of pulmonary edema. Co-ox 73%. CVP 11-12  Cath 1/22: LAD 65%, LCx 20%  General:  Intubated/sedated HEENT: +ETT Neck: supple. RIJ TLC Carotids 2+ bilat; no bruits. No lymphadenopathy or thryomegaly  appreciated. Cor: PMI nondisplaced. Regular rate & rhythm. No rubs, gallops or murmurs. Lungs: relatively clear Abdomen: soft, nontender, nondistended. No hepatosplenomegaly. No bruits or masses. Good bowel sounds. Extremities: no cyanosis, clubbing, rash, trace edema Neuro: intubated sedated  I remain a bit unclear as to inciting event here. Seems to have a mild degree of HF but not enough to cause intubation.Given elevated PCT suspect AECOPD.infection may be primary factor but hard to tell.   Suspect AKI may be due to transient hypotension/ATN.   Continue supportive care. Continue abx. Will give another dose of IV lasix in am (got 40 today with minimal output. Will give 80 in am). Support BP with NE as needed.   CRITICAL CARE Performed by: Glori Bickers  Total critical care time: 55 minutes  Critical care time was exclusive of separately billable procedures and treating  other patients.  Critical care was necessary to treat or prevent imminent or life-threatening deterioration.  Critical care was time spent personally by me (independent of midlevel providers or residents) on the following activities: development of treatment plan with patient and/or surrogate as well as nursing, discussions with consultants, evaluation of patient's response to treatment, examination of patient, obtaining history from patient or surrogate, ordering and performing treatments and interventions, ordering and review of laboratory studies, ordering and review of radiographic studies, pulse oximetry and re-evaluation of patient's condition.  Glori Bickers, MD  7:51 PM

## 2021-04-06 NOTE — H&P (Addendum)
History and Physical    Hannah French PVX:[id number] DOB: 1941-04-19 DOA: 04/06/2021  PCP: Dettinger, Fransisca Kaufmann, MD   Patient coming from: Home  Chief Complaint: Dyspnea and confusion  HPI: Hannah French is a 80 y.o. 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  She was recently prescribed some steroids for suspected COPD exacerbation by her PCP about 5 days ago.  She was also told to take increasing doses of her diuretics given some worsening swelling to her lower extremities as well as recent weight gain of approximately 6-8 pounds according to the daughter at bedside.  She continues to smoke approximately 1 pack/day.  She was noted to have some nausea, but no vomiting and no fever, chills, or significant cough were noted.   ED Course: Patient was noted to have significant respiratory distress and was quite agitated and therefore required intubation which was performed by EDP.  Initial lactic acid was 4.2, but subsequent was 3.3.  She was also noted to have a leukocytosis of 16,000 with a left shift and BNP was elevated over 900.  Blood glucose levels were over 500 and she was started on insulin drip.  Troponins were within normal limits and EKG demonstrated some sinus tachycardia, but no other significant findings.  Chest x-ray shows worsening diffuse disease consistent with pulmonary edema and/or atypical infection.  She was starting to have lower blood pressure readings with propofol for sedation and this has been now changed over to Versed and fentanyl.  COVID testing is negative.  Initial ABG with pH of 7.083 and PCO2 of 74.8 and subsequent ABG demonstrates improvement of pH to over 7.2 with PCO2 of approximately  55.  Review of Systems: Could not be obtained given patient condition.  Past Medical History:  Diagnosis Date   Abdominal bruit    Bladder cancer ([place])    CAD (coronary artery disease)    a. nonobstructive by cath 08/2018.   Carotid bruit    Chronic combined systolic and diastolic CHF (congestive heart failure) (HCC)    CKD (chronic kidney disease), stage II    Congestive heart failure (CHF) (HCC)    Diabetes mellitus    Type II   Dyslipidemia    Heart murmur    Mild pulmonary hypertension (HCC)    Mitral valve prolapse    a. not seen on most recent echoes. Mild MR now.   NICM (nonischemic cardiomyopathy) ([place])    Osteoporosis    Pancreatitis, acute    Pseudogout    PVD (peripheral vascular disease) ([place])    a. 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   Sinus tachycardia    Tobacco abuse     Past Surgical History:  Procedure Laterality Date   ABDOMINAL AORTOGRAM W/LOWER EXTREMITY N/A 12/31/2019   Procedure: ABDOMINAL AORTOGRAM W/LOWER EXTREMITY;  Surgeon: Wellington Hampshire, MD;  Location: [facility];  Service: Cardiovascular;  Laterality: N/A;   ABDOMINAL AORTOGRAM W/LOWER EXTREMITY N/A 09/01/2020   Procedure: ABDOMINAL AORTOGRAM W/LOWER EXTREMITY;  Surgeon: Wellington Hampshire, MD;  Location: [facility];  Service: Cardiovascular;  Laterality: N/A;   APPENDECTOMY     CORONARY STENT INTERVENTION N/A 10/13/2020   Procedure: CORONARY STENT INTERVENTION;  Surgeon: Ellyn Hack,  Leonie Green, MD;  Location: [facility];  Service: Cardiovascular;  Laterality: N/A;   Hysterectomy-type unspecified     INTRAVASCULAR PRESSURE WIRE/FFR STUDY N/A 10/13/2020   Procedure: INTRAVASCULAR PRESSURE WIRE/FFR STUDY;  Surgeon: Leonie Man, MD;  Location: [facility];  Service: Cardiovascular;  Laterality: N/A;   LAPAROSCOPIC CHOLECYSTECTOMY  2009   PERIPHERAL VASCULAR INTERVENTION  12/31/2019   Procedure: PERIPHERAL VASCULAR  INTERVENTION;  Surgeon: Wellington Hampshire, MD;  Location: [facility];  Service: Cardiovascular;;  Bilateral [place]   RIGHT/LEFT HEART CATH AND CORONARY ANGIOGRAPHY N/A 09/16/2018   Procedure: RIGHT/LEFT HEART CATH AND CORONARY ANGIOGRAPHY;  Surgeon: Sherren Mocha, MD;  Location: [facility];  Service: Cardiovascular;  Laterality: N/A;   RIGHT/LEFT HEART CATH AND CORONARY ANGIOGRAPHY N/A 10/13/2020   Procedure: RIGHT/LEFT HEART CATH AND CORONARY ANGIOGRAPHY;  Surgeon: Jolaine Artist, MD;  Location: [facility];  Service: Cardiovascular;  Laterality: N/A;     reports that she has been smoking cigarettes. She has a 50.00 pack-year smoking history. She has never used smokeless tobacco. She reports that she does not drink alcohol and does not use drugs.  Allergies  Allergen Reactions   Contrast Media [Iodinated Diagnostic Agents] Shortness Of Breath   Iohexol Other (See Comments)    PASSED OUT DURING THE TEST     Metformin Swelling    Face and hands became swollen   Statins Swelling and Other (See Comments)    They make the patient hurt "all over" Myalgia, also   Bextra [Valdecoxib] Nausea And Vomiting and Swelling    Shut down my kidneys    Cefdinir Other (See Comments)    Pancreatitis    Cozaar [Losartan Potassium] Other (See Comments)    dizziness   Gabapentin    Nsaids Other (See Comments)    GI intolerance and pain all over, tolerates ibu    Zetia [Ezetimibe] Swelling   Rofecoxib Other (See Comments)    "VIOXX"- "SHUT DOWN MY KIDNEYS"   Sulfa Antibiotics Nausea And Vomiting and Other (See Comments)    Pancreatitis    Family History  Problem Relation Age of Onset   Heart failure Mother    Hypertension Sister    Heart attack Brother    Heart attack Maternal Uncle    Heart disease Sister    COPD Brother    Congestive Heart Failure Brother    Congestive Heart Failure Daughter    Rheum arthritis Son    Ovarian cancer Sister    Coronary artery  disease Neg Hx        Early    Prior to Admission medications   Medication Sig Start Date End Date Taking? Authorizing Provider  acetaminophen (TYLENOL) 500 MG tablet Take 500 mg by mouth every 6 (six) hours as needed for moderate pain.   Yes [provider]  albuterol (VENTOLIN HFA) 108 (90 Base) MCG/ACT inhaler Inhale 2 puffs into the lungs every 6 (six) hours as needed for wheezing or shortness of breath. 02/02/21  Yes Martyn Ehrich, NP  Cholecalciferol (VITAMIN D) 50 MCG (2000 UT) tablet Take 4,000 Units by mouth daily.    Yes [provider]  clopidogrel (PLAVIX) 75 MG tablet Take 1 tablet (75 mg total) by mouth daily. 10/13/20  Yes Reino Bellis B, NP  ferrous sulfate 325 (65 FE) MG tablet Take 1 tablet (325 mg total) by mouth daily with breakfast. 04/01/21 05/01/21 Yes Dettinger, Fransisca Kaufmann, MD  Fluticasone-Umeclidin-Vilant (TRELEGY  ELLIPTA) 100-62.5-25 MCG/INH AEPB Inhale 1 puff into the lungs daily. 02/02/21  Yes Martyn Ehrich, NP  furosemide (LASIX) 20 MG tablet Take 2 tablets daily. You may take an extra dose for increase weight gain orswelling 09/09/20  Yes Dettinger, Fransisca Kaufmann, MD  glimepiride (AMARYL) 2 MG tablet Take 1-2 tablets (2-4 mg total) by mouth daily with breakfast. 09/09/20  Yes Dettinger, Fransisca Kaufmann, MD  Glucose Blood (BLOOD GLUCOSE TEST STRIPS) STRP 1 strip by In Vitro route 2 (two) times daily. 04/01/21  Yes Dettinger, Fransisca Kaufmann, MD  insulin aspart (NOVOLOG) 100 UNIT/ML injection Inject 5-20 Units into the skin 3 (three) times daily before meals. 02/02/21  Yes Dettinger, Fransisca Kaufmann, MD  insulin degludec (TRESIBA FLEXTOUCH) 100 UNIT/ML FlexTouch Pen Inject 10 Units into the skin daily. 09/09/20  Yes Dettinger, Fransisca Kaufmann, MD  levalbuterol (XOPENEX) 0.63 MG/3ML nebulizer solution NEBULIZE 1 VIAL EVERY 4 HOURS AS NEEDED FOR WHEEZING OR SHORTNESS OF BREATH 03/11/21  Yes Tanda Rockers, MD  metolazone (ZAROXOLYN) 2.5 MG tablet Take 1 tablet (2.5 mg total) by mouth  as directed. By HF clinic 04/04/21  Yes Bensimhon, Shaune Pascal, MD  metoprolol succinate (TOPROL-XL) 25 MG 24 hr tablet TAKE 1 TABLET DAILY 03/16/21  Yes Bensimhon, Shaune Pascal, MD  Naphazoline-Pheniramine (OPCON-A) 0.027-0.315 % SOLN Place 1 drop into both eyes daily as needed (Burning and itching eyes).   Yes [provider]  nitroGLYCERIN (NITRODUR - DOSED IN MG/24 HR) 0.2 mg/hr patch Place 1 patch (0.2 mg total) onto the skin daily. 11/02/20  Yes Newt Minion, MD  nitroGLYCERIN (NITROSTAT) 0.3 MG SL tablet Place 1 tablet (0.3 mg total) under the tongue every 5 (five) minutes as needed for chest pain. 02/02/21 02/02/22 Yes Dettinger, Fransisca Kaufmann, MD  pantoprazole (PROTONIX) 40 MG tablet Take 1 tablet (40 mg total) by mouth 2 (two) times daily for 20 days. 02/22/21 03/23/22 Yes Shahmehdi, Seyed A, MD  pentoxifylline (TRENTAL) 400 MG CR tablet TAKE  (1)  TABLET  THREE TIMES DAILY WITH MEALS. 03/10/21  Yes Newt Minion, MD  potassium chloride SA (KLOR-CON) 20 MEQ tablet Take 2 tablets (40 mEq total) by mouth as directed. Only when you take Metolazone 04/04/21  Yes Bensimhon, Shaune Pascal, MD  predniSONE (DELTASONE) 20 MG tablet Take 3 tabs daily for 1 week, then 2 tabs daily for week 2, then 1 tab daily for week 3. 04/01/21  Yes Dettinger, Fransisca Kaufmann, MD  sennosides-docusate sodium (SENOKOT-S) 8.6-50 MG tablet Take 1 tablet by mouth daily. 04/04/21  Yes Dettinger, Fransisca Kaufmann, MD    Physical Exam: Vitals:   04/06/21 0615 04/06/21 0645 04/06/21 0730 04/06/21 0750  BP: 126/67 (!) 107/48 (!) 97/44   Pulse: (!) 114 (!) 104 98   Resp: (!) 23 14 (!) 22   Temp: (!) 96.8 F (36 C) (!) 96.62 F (35.9 C) (!) 96.6 F (35.9 C)   TempSrc: Bladder Bladder    SpO2: 98% 97% 97% 97%  Weight:      Height:        Constitutional: NAD, currently sedated on propofol and unresponsive to questioning or tactile stimulus. Vitals:   04/06/21 0615 04/06/21 0645 04/06/21 0730 04/06/21 0750  BP: 126/67 (!) 107/48 (!) 97/44   Pulse:  (!) 114 (!) 104 98   Resp: (!) 23 14 (!) 22   Temp: (!) 96.8 F (36 C) (!) 96.62 F (35.9 C) (!) 96.6 F (35.9 C)   TempSrc: Bladder Bladder  SpO2: 98% 97% 97% 97%  Weight:      Height:       Eyes: lids and conjunctivae normal Neck: normal, supple Respiratory: Diminished to auscultation bilaterally.  Currently intubated with FiO2 40%. Cardiovascular: Regular rate and rhythm, no murmurs.  Tachycardic. Abdomen: no tenderness, no distention.  Musculoskeletal: Scant bilateral ankle edema Skin: Mild mottling noted to toes bilaterally.  Dorsalis pedis pulses palpated, but faintly bilaterally. Psychiatric: Could not be assessed.  Labs on Admission: I have personally reviewed following labs and imaging studies  CBC: Recent Labs  Lab 04/01/21 1450 04/06/21 0347  WBC 11.2* 16.1*  NEUTROABS 8.2* 12.3*  HGB 11.1 11.9*  HCT 33.9* 40.2  MCV 95 107.2*  PLT 448 591*   Basic Metabolic Panel: Recent Labs  Lab 04/01/21 1450 04/06/21 0347  NA 140 136  K 4.5 4.9  CL 97 98  CO2 27 27  GLUCOSE 151* 496*  BUN 29* 56*  CREATININE 1.53* 2.13*  CALCIUM 9.6 8.7*   GFR: Estimated Creatinine Clearance: 14.4 mL/min (A) (by C-G formula based on SCr of 2.13 mg/dL (H)). Liver Function Tests: Recent Labs  Lab 04/01/21 1450  AST 13  ALT 10  ALKPHOS 64  BILITOT 0.6  PROT 6.4  ALBUMIN 4.4   No results for input(s): LIPASE, AMYLASE in the last 168 hours. No results for input(s): AMMONIA in the last 168 hours. Coagulation Profile: No results for input(s): INR, PROTIME in the last 168 hours. Cardiac Enzymes: No results for input(s): CKTOTAL, CKMB, CKMBINDEX, TROPONINI in the last 168 hours. BNP (last 3 results) No results for input(s): PROBNP in the last 8760 hours. HbA1C: No results for input(s): HGBA1C in the last 72 hours. CBG: Recent Labs  Lab 04/06/21 0530 04/06/21 0620 04/06/21 0657 04/06/21 0733  GLUCAP 541* 574* 539* 528*   Lipid Profile: No results for input(s):  CHOL, HDL, LDLCALC, TRIG, CHOLHDL, LDLDIRECT in the last 72 hours. Thyroid Function Tests: No results for input(s): TSH, T4TOTAL, FREET4, T3FREE, THYROIDAB in the last 72 hours. Anemia Panel: No results for input(s): VITAMINB12, FOLATE, FERRITIN, TIBC, IRON, RETICCTPCT in the last 72 hours. Urine analysis:    Component Value Date/Time   COLORURINE YELLOW 10/09/2020 1740   APPEARANCEUR CLEAR 10/09/2020 1740   APPEARANCEUR Clear 05/16/2018 1629   LABSPEC 1.014 10/09/2020 1740   PHURINE 5.0 10/09/2020 1740   GLUCOSEU >=500 (A) 10/09/2020 1740   HGBUR NEGATIVE 10/09/2020 1740   BILIRUBINUR NEGATIVE 10/09/2020 1740   BILIRUBINUR Negative 05/16/2018 1629   KETONESUR NEGATIVE 10/09/2020 1740   PROTEINUR NEGATIVE 10/09/2020 1740   UROBILINOGEN negative 06/09/2014 1043   NITRITE NEGATIVE 10/09/2020 1740   LEUKOCYTESUR NEGATIVE 10/09/2020 1740    Radiological Exams on Admission: DG Chest 1 View  Result Date: 04/06/2021 CLINICAL DATA:  80 year old female intubated. Shortness of breath and tachycardia. EXAM: CHEST  1 VIEW COMPARISON:  Portable chest 0322 hours today and earlier. FINDINGS: Portable AP supine view at 0404 hours. Intubated with endotracheal tube tip in good position between the clavicles and carina. Enteric tube courses to the abdomen and side hole is at the level of the gastric fundus. Mediastinal contours are stable and within normal limits. Coarse bilateral pulmonary interstitial opacity persists, progressed since 04/01/2021. No pneumothorax, pleural effusion or consolidation. Stable visualized osseous structures. IMPRESSION: 1. Endotracheal tube and enteric tube in good position. 2. Ongoing coarse bilateral pulmonary interstitial opacity, acute new since 04/01/2021, with differential considerations of viral/atypical pneumonia versus pulmonary edema. Electronically Signed   By: Lemmie Evens  [place] Crane M.D.   On: 04/06/2021 04:18   DG Chest Portable 1 View  Result Date: 04/06/2021 CLINICAL DATA:   Shortness of breath, confusion, tachycardia EXAM: PORTABLE CHEST 1 VIEW COMPARISON:  Radiograph 04/01/2021 FINDINGS: Markedly increased coarse reticulonodular opacities are present throughout both lungs with indistinct pulmonary vascularity. No pneumothorax or visible effusion. No focal consolidative process is seen. Stable cardiomediastinal contours with a calcified aorta. No acute osseous or soft tissue abnormality. IMPRESSION: Interval development of diffuse heterogeneous reticular opacities throughout the lungs with peripheral septal thickening. Rapid development favoring pulmonary edema versus atypical infection/interstitial inflammation. Aortic Atherosclerosis (ICD10-I70.0). Electronically Signed   By: Lovena Le M.D.   On: 04/06/2021 03:31    EKG: Independently reviewed. 140bpm, ST, IVCD.  Assessment/Plan Active Problems:   Acute respiratory failure (HCC)    Acute hypoxemic/hypercapnic respiratory failure-multifactorial -Noted to have COPD exacerbation as well as CHF exacerbation -Continue ventilator management per intensivist service -ABG showing signs of improvement -Repeat ABG and chest x-ray in a.m.  Acute diastolic CHF exacerbation -Noted to have recent weight gain of approximately 6-8 pounds with baseline weight near 112 pounds per daughter -Recent 2D echocardiogram on 10/21/2020 with LVEF 40-45% with wall motion abnormalities and noted cardiomyopathy.  Grade 1 diastolic dysfunction -Follows with Dr. Haroldine Laws -Given dose of 40 mg IV Lasix x1 in ED, holding further doses given soft BP readings and until further evaluation by cardiology -Daily weights, strict I's and O's -Repeat 2D echocardiogram  Acute COPD exacerbation -Possibility of viral, atypical pneumonia -Continue on azithromycin -Check urine Legionella and strep pneumonia -IV Solu-Medrol twice daily -Pulmicort twice daily -Breathing treatments as scheduled  Lactic acidosis with possible sepsis present on  admission -Patient noted to meet SIRS criteria -Could have possible atypical infection to her lungs -Maintain on azithromycin as discussed above -Continue to monitor trend -Noted to have leukocytosis that could be related to recent steroid use versus actual infection -Procalcitonin ordered and pending -Plan to check urine analysis  Hyperglycemic crisis -Likely related to recent steroid use for COPD exacerbation -Currently on insulin drip -Check A1c, noted to be 9.8% in April -D5 fluid ordered for blood glucose less than 250  AKI on CKD stage IIIa -Baseline creatinine near 1.1-1.5 -Possibly related to sepsis physiology and/or cardiorenal in etiology -Continue to monitor strict I's and O's -Avoid nephrotoxic agents -A.m. labs  History of peripheral vascular disease -Has had prior stenting procedure to lower extremities -We will need careful monitoring of mottling over toes -Removed nitroglycerin patch over left foot  Ongoing tobacco abuse -Patient will require tobacco cessation counseling  GERD -IV PPI daily for GI prophylaxis  Critical care time of 70 mins.  DVT prophylaxis: Heparin Code Status: Full Family Communication: Daughter at bedside Disposition Plan:Admit to intensivist service at [place] called:PCCM Admission status: Inpatient, ICU  Cahlil Sattar D Camil Hausmann DO [facility]  If 7PM-7AM, please contact night-coverage [email or web address]  04/06/2021, 7:53 AM

## 2021-04-07 ENCOUNTER — Inpatient Hospital Stay (HOSPITAL_COMMUNITY): Payer: Medicare Other

## 2021-04-07 DIAGNOSIS — J9622 Acute and chronic respiratory failure with hypercapnia: Secondary | ICD-10-CM | POA: Diagnosis not present

## 2021-04-07 DIAGNOSIS — I5023 Acute on chronic systolic (congestive) heart failure: Secondary | ICD-10-CM | POA: Diagnosis not present

## 2021-04-07 DIAGNOSIS — J9621 Acute and chronic respiratory failure with hypoxia: Secondary | ICD-10-CM | POA: Diagnosis not present

## 2021-04-07 DIAGNOSIS — J9601 Acute respiratory failure with hypoxia: Secondary | ICD-10-CM | POA: Diagnosis not present

## 2021-04-07 DIAGNOSIS — J9602 Acute respiratory failure with hypercapnia: Secondary | ICD-10-CM | POA: Diagnosis not present

## 2021-04-07 LAB — COMPREHENSIVE METABOLIC PANEL
ALT: 51 U/L — ABNORMAL HIGH (ref 0–44)
AST: 27 U/L (ref 15–41)
Albumin: 3.1 g/dL — ABNORMAL LOW (ref 3.5–5.0)
Alkaline Phosphatase: 38 U/L (ref 38–126)
Anion gap: 10 (ref 5–15)
BUN: 65 mg/dL — ABNORMAL HIGH (ref 8–23)
CO2: 24 mmol/L (ref 22–32)
Calcium: 8.7 mg/dL — ABNORMAL LOW (ref 8.9–10.3)
Chloride: 104 mmol/L (ref 98–111)
Creatinine, Ser: 2.59 mg/dL — ABNORMAL HIGH (ref 0.44–1.00)
GFR, Estimated: 18 mL/min — ABNORMAL LOW (ref >60)
Glucose, Bld: 223 mg/dL — ABNORMAL HIGH (ref 70–99)
Potassium: 5.1 mmol/L (ref 3.5–5.1)
Sodium: 138 mmol/L (ref 135–145)
Total Bilirubin: 1.3 mg/dL — ABNORMAL HIGH (ref 0.3–1.2)
Total Protein: 5.6 g/dL — ABNORMAL LOW (ref 6.5–8.1)

## 2021-04-07 LAB — CBC
HCT: 31.8 % — ABNORMAL LOW (ref 36.0–46.0)
Hemoglobin: 9.7 g/dL — ABNORMAL LOW (ref 12.0–15.0)
MCH: 31.5 pg (ref 26.0–34.0)
MCHC: 30.5 g/dL (ref 30.0–36.0)
MCV: 103.2 fL — ABNORMAL HIGH (ref 80.0–100.0)
Platelets: 363 10*3/uL (ref 150–400)
RBC: 3.08 MIL/uL — ABNORMAL LOW (ref 3.87–5.11)
RDW: 23.5 % — ABNORMAL HIGH (ref 11.5–15.5)
WBC: 14.8 10*3/uL — ABNORMAL HIGH (ref 4.0–10.5)
nRBC: 0.4 % — ABNORMAL HIGH (ref 0.0–0.2)

## 2021-04-07 LAB — POCT I-STAT 7, (LYTES, BLD GAS, ICA,H+H)
Acid-base deficit: 1 mmol/L (ref 0.0–2.0)
Bicarbonate: 24.6 mmol/L (ref 20.0–28.0)
Calcium, Ion: 1.22 mmol/L (ref 1.15–1.40)
HCT: 31 % — ABNORMAL LOW (ref 36.0–46.0)
Hemoglobin: 10.5 g/dL — ABNORMAL LOW (ref 12.0–15.0)
O2 Saturation: 95 %
Patient temperature: 37.5
Potassium: 4.9 mmol/L (ref 3.5–5.1)
Sodium: 135 mmol/L (ref 135–145)
TCO2: 26 mmol/L (ref 22–32)
pCO2 arterial: 44.5 mmHg (ref 32.0–48.0)
pH, Arterial: 7.354 (ref 7.350–7.450)
pO2, Arterial: 84 mmHg (ref 83.0–108.0)

## 2021-04-07 LAB — GLUCOSE, CAPILLARY
Glucose-Capillary: 200 mg/dL — ABNORMAL HIGH (ref 70–99)
Glucose-Capillary: 255 mg/dL — ABNORMAL HIGH (ref 70–99)
Glucose-Capillary: 272 mg/dL — ABNORMAL HIGH (ref 70–99)
Glucose-Capillary: 294 mg/dL — ABNORMAL HIGH (ref 70–99)
Glucose-Capillary: 144 mg/dL — ABNORMAL HIGH (ref 70–99)

## 2021-04-07 LAB — URINE CULTURE: Culture: NO GROWTH

## 2021-04-07 LAB — VANCOMYCIN, RANDOM: Vancomycin Rm: 14

## 2021-04-07 LAB — MAGNESIUM: Magnesium: 2.4 mg/dL (ref 1.7–2.4)

## 2021-04-07 MED ORDER — NICOTINE 14 MG/24HR TD PT24
14.0000 mg | MEDICATED_PATCH | Freq: Every day | TRANSDERMAL | Status: DC
  Administered 2021-04-07 – 2021-04-12 (×6): 14 mg via TRANSDERMAL
  Filled 2021-04-07 (×6): qty 1

## 2021-04-07 MED ORDER — REVEFENACIN 175 MCG/3ML IN SOLN
175.0000 ug | Freq: Every day | RESPIRATORY_TRACT | Status: DC
  Administered 2021-04-07 – 2021-04-09 (×3): 175 ug via RESPIRATORY_TRACT
  Filled 2021-04-07 (×4): qty 3

## 2021-04-07 MED ORDER — HALOPERIDOL LACTATE 5 MG/ML IJ SOLN: 5.0000 mg | Freq: Four times a day (QID) | INTRAMUSCULAR | Status: DC | PRN

## 2021-04-07 MED ORDER — SODIUM CHLORIDE 0.9 % IV SOLN
1.0000 g | INTRAVENOUS | Status: DC
  Administered 2021-04-07 – 2021-04-08 (×2): 1 g via INTRAVENOUS
  Filled 2021-04-07: qty 10
  Filled 2021-04-07: qty 1

## 2021-04-07 MED ORDER — INSULIN GLARGINE 100 UNIT/ML ~~LOC~~ SOLN
10.0000 [IU] | Freq: Once | SUBCUTANEOUS | Status: AC
  Administered 2021-04-07: 10 [IU] via SUBCUTANEOUS
  Filled 2021-04-07: qty 0.1

## 2021-04-07 MED ORDER — HALOPERIDOL LACTATE 5 MG/ML IJ SOLN
1.0000 mg | Freq: Four times a day (QID) | INTRAMUSCULAR | Status: DC | PRN
  Administered 2021-04-07: 1 mg via INTRAVENOUS
  Filled 2021-04-07: qty 1

## 2021-04-07 MED ORDER — ALBUTEROL SULFATE (2.5 MG/3ML) 0.083% IN NEBU
2.5000 mg | INHALATION_SOLUTION | RESPIRATORY_TRACT | Status: DC | PRN
  Administered 2021-04-07 – 2021-04-09 (×2): 2.5 mg via RESPIRATORY_TRACT
  Filled 2021-04-07 (×2): qty 3

## 2021-04-07 MED ORDER — CHLORHEXIDINE GLUCONATE 0.12 % MT SOLN
15.0000 mL | Freq: Two times a day (BID) | OROMUCOSAL | Status: DC
  Administered 2021-04-07 – 2021-04-09 (×4): 15 mL via OROMUCOSAL
  Filled 2021-04-07 (×3): qty 15

## 2021-04-07 MED ORDER — ORAL CARE MOUTH RINSE
15.0000 mL | Freq: Two times a day (BID) | OROMUCOSAL | Status: DC
  Administered 2021-04-07: 15 mL via OROMUCOSAL

## 2021-04-07 MED ORDER — POLYETHYLENE GLYCOL 3350 17 G PO PACK: 17.0000 g | PACK | Freq: Every day | ORAL | Status: DC

## 2021-04-07 MED ORDER — ARFORMOTEROL TARTRATE 15 MCG/2ML IN NEBU
15.0000 ug | INHALATION_SOLUTION | Freq: Two times a day (BID) | RESPIRATORY_TRACT | Status: DC
  Administered 2021-04-07 – 2021-04-09 (×5): 15 ug via RESPIRATORY_TRACT
  Filled 2021-04-07 (×5): qty 2

## 2021-04-07 MED ORDER — SENNOSIDES 8.8 MG/5ML PO SYRP: 10.0000 mL | ORAL_SOLUTION | Freq: Two times a day (BID) | ORAL | Status: DC

## 2021-04-07 MED ORDER — VANCOMYCIN HCL 750 MG/150ML IV SOLN
750.0000 mg | Freq: Once | INTRAVENOUS | Status: DC
  Filled 2021-04-07: qty 150

## 2021-04-07 MED ORDER — FUROSEMIDE 10 MG/ML IJ SOLN
80.0000 mg | Freq: Once | INTRAMUSCULAR | Status: AC
  Administered 2021-04-07: 80 mg via INTRAVENOUS
  Filled 2021-04-07: qty 8

## 2021-04-07 NOTE — Progress Notes (Signed)
Inpatient Diabetes Program Recommendations  AACE/ADA: New Consensus Statement on Inpatient Glycemic Control   Target Ranges:  Prepandial:   less than 140 mg/dL      Peak postprandial:   less than 180 mg/dL (1-2 hours)      Critically ill patients:  140 - 180 mg/dL   Results for Hannah French, Hannah French" (MRN [id number]) as of 04/07/2021 12:01  Ref. Range 04/07/2021 03:29 04/07/2021 07:27 04/07/2021 11:36  Glucose-Capillary Latest Ref Range: 70 - 99 mg/dL 200 (H) 255 (H) 272 (H)  Results for [name], [name]" (MRN [id number]) as of 04/07/2021 12:01  Ref. Range 04/06/2021 08:29 04/06/2021 09:03 04/06/2021 09:32 04/06/2021 10:09 04/06/2021 12:19 04/06/2021 13:26 04/06/2021 14:29 04/06/2021 16:11 04/06/2021 19:39 04/06/2021 23:48  Glucose-Capillary Latest Ref Range: 70 - 99 mg/dL 464 (H) 451 (H) 393 (H) 338 (H) 191 (H) 131 (H) 97 132 (H) 171 (H) 172 (H)   Review of Glycemic Control  Diabetes history: DM2 Outpatient Diabetes medications: Tresiba 10 units daily, Novolog 5-20 units TID with meals, Amaryl 2-4 mg daily Current orders for Inpatient glycemic control: Lantus 15 units QHS, Novolog 0-20 units Q4H; Solumedrol 40 mg Q12H; Vital @ 40 ml/hr  Inpatient Diabetes Program Recommendations:    Insulin:  Please consider ordering Novolog 5 units Q4H for tube feeding coverage. If tube feeding is stopped or held then Novolog tube feeding coverage should also be stopped or held.

## 2021-04-07 NOTE — Procedures (Signed)
Extubation Procedure Note  Patient Details:   Name: Hannah French DOB: [birth year] MRN: [id number]   Airway Documentation:    Vent end date: 04/07/21 Vent end time: 0911   Evaluation  O2 sats: stable throughout Complications: No apparent complications Patient did tolerate procedure well. Bilateral Breath Sounds: Expiratory wheezes   Pt extubated to 3L [place] per MD order. Pt had positive cuff leak prior to extubation. No stridor noted. Pt able to voice her name.   Vilinda Blanks 04/07/2021, 9:15 AM

## 2021-04-07 NOTE — Progress Notes (Signed)
Advanced Heart Failure Rounding Note   Subjective:    Awake on vent. Following commands.  CVP 13 this am.  Co-ox 73% yesterday. Off NE   CXR improved   SCr 2.4 -> 2.6  Echo 04/06/21 EF 25-30% RV mildly reduced   Objective:   Weight Range:  Vital Signs:   Temp:  [96.4 F (35.8 C)-99.32 F (37.4 C)] 99.32 F (37.4 C) (07/14 0600) Pulse Rate:  [80-120] 98 (07/14 0737) Resp:  [17-31] 26 (07/14 0737) BP: (83-136)/(45-96) 116/50 (07/14 0737) SpO2:  [91 %-100 %] 98 % (07/14 0738) FiO2 (%):  [40 %] 40 % (07/14 0738) Weight:  [57.4 kg] 57.4 kg (07/14 0500) Last BM Date:  (PTA)  Weight change: Filed Weights   04/06/21 0300 04/07/21 0500  Weight: 51 kg 57.4 kg    Intake/Output:   Intake/Output Summary (Last 24 hours) at 04/07/2021 0840 Last data filed at 04/07/2021 0800 Gross per 24 hour  Intake 1737.43 ml  Output 1595 ml  Net 142.43 ml     Physical Exam: General:  Awake on vent  HEENT: normal + ETT Neck: supple. JVP to jaw . Carotids 2+ bilat; no bruits. No lymphadenopathy or thryomegaly appreciated. Cor: PMI nondisplaced. Regular rate & rhythm. No rubs, gallops or murmurs. Lungs: clear Abdomen: soft, nontender, nondistended. No hepatosplenomegaly. No bruits or masses. Good bowel sounds. Extremities: no cyanosis, clubbing, rash, tr edema Neuro: following commands  Telemetry: NSR 90s Personally reviewed   Labs: Basic Metabolic Panel: Recent Labs  Lab 04/01/21 1450 04/06/21 0347 04/06/21 0857 04/06/21 1226 04/06/21 1304 04/07/21 0445 04/07/21 0712  NA 140 136  --  139 140 138 135  K 4.5 4.9  --  3.5 3.7 5.1 4.9  CL 97 98  --   --  103 104  --   CO2 27 27  --   --  27 24  --   GLUCOSE 151* 496*  --   --  148* 223*  --   BUN 29* 56*  --   --  60* 65*  --   CREATININE 1.53* 2.13* 2.37*  --  2.38* 2.59*  --   CALCIUM 9.6 8.7*  --   --  9.0 8.7*  --   MG  --   --   --   --  2.4 2.4  --   PHOS  --   --   --   --  4.2  --   --     Liver Function  Tests: Recent Labs  Lab 04/01/21 1450 04/06/21 1304 04/07/21 0445  AST 13 53* 27  ALT 10 68* 51*  ALKPHOS 64 42 38  BILITOT 0.6 0.8 1.3*  PROT 6.4 5.8* 5.6*  ALBUMIN 4.4 3.6 3.1*   Recent Labs  Lab 04/06/21 1304  LIPASE 29  AMYLASE 57   No results for input(s): AMMONIA in the last 168 hours.  CBC: Recent Labs  Lab 04/01/21 1450 04/06/21 0347 04/06/21 0347 04/06/21 0857 04/06/21 1226 04/06/21 1304 04/07/21 0445 04/07/21 0712  WBC 11.2* 16.1*  --  22.4*  --  20.6* 14.8*  --   NEUTROABS 8.2* 12.3*  --   --   --   --   --   --   HGB 11.1 11.9*   < > 10.1* 11.9* 9.9* 9.7* 10.5*  HCT 33.9* 40.2   < > 34.1* 35.0* 32.5* 31.8* 31.0*  MCV 95 107.2*  --  106.6*  --  103.2* 103.2*  --  PLT 448 651*  --  455*  --  436* 363  --    < > = values in this interval not displayed.    Cardiac Enzymes: No results for input(s): CKTOTAL, CKMB, CKMBINDEX, TROPONINI in the last 168 hours.  BNP: BNP (last 3 results) Recent Labs    01/19/21 0522 02/19/21 1943 04/06/21 0347  BNP 288.0* 179.0* 918.0*    ProBNP (last 3 results) No results for input(s): PROBNP in the last 8760 hours.    Other results:  Imaging: DG Chest 1 View  Result Date: 04/06/2021 CLINICAL DATA:  80 year old female intubated. Shortness of breath and tachycardia. EXAM: CHEST  1 VIEW COMPARISON:  Portable chest 0322 hours today and earlier. FINDINGS: Portable AP supine view at 0404 hours. Intubated with endotracheal tube tip in good position between the clavicles and carina. Enteric tube courses to the abdomen and side hole is at the level of the gastric fundus. Mediastinal contours are stable and within normal limits. Coarse bilateral pulmonary interstitial opacity persists, progressed since 04/01/2021. No pneumothorax, pleural effusion or consolidation. Stable visualized osseous structures. IMPRESSION: 1. Endotracheal tube and enteric tube in good position. 2. Ongoing coarse bilateral pulmonary interstitial  opacity, acute new since 04/01/2021, with differential considerations of viral/atypical pneumonia versus pulmonary edema. Electronically Signed   By: Genevie Ann M.D.   On: 04/06/2021 04:18   DG CHEST PORT 1 VIEW  Result Date: 04/06/2021 CLINICAL DATA:  Central line placement. EXAM: PORTABLE CHEST 1 VIEW COMPARISON:  April 06, 2021. FINDINGS: LEFT-sided IJ central venous line terminates at the caval to atrial junction. Endotracheal tube terminates approximately 3.6 cm above the carina. Gastric tube with side port below GE junction, tip off the field of view. Cardiomediastinal contours and hilar structures are stable. Structure overlies the LEFT upper chest in there is also likely in associated skin fold in this area. No sign of pneumothorax. No effusion on frontal radiograph. Increased interstitial markings may be slightly improved compared with previous imaging accounting for differences in technique. On limited assessment no acute skeletal process. IMPRESSION: 1. LEFT IJ central venous line terminates at the caval to atrial junction. No sign of pneumothorax. 2. Additional support apparatus without change. 3. Potential improvement with respect to interstitial markings accounting for differences in technique, attention on follow-up. Electronically Signed   By: Zetta Bills M.D.   On: 04/06/2021 12:40   DG Chest Portable 1 View  Result Date: 04/06/2021 CLINICAL DATA:  Shortness of breath, confusion, tachycardia EXAM: PORTABLE CHEST 1 VIEW COMPARISON:  Radiograph 04/01/2021 FINDINGS: Markedly increased coarse reticulonodular opacities are present throughout both lungs with indistinct pulmonary vascularity. No pneumothorax or visible effusion. No focal consolidative process is seen. Stable cardiomediastinal contours with a calcified aorta. No acute osseous or soft tissue abnormality. IMPRESSION: Interval development of diffuse heterogeneous reticular opacities throughout the lungs with peripheral septal  thickening. Rapid development favoring pulmonary edema versus atypical infection/interstitial inflammation. Aortic Atherosclerosis (ICD10-I70.0). Electronically Signed   By: Lovena Le M.D.   On: 04/06/2021 03:31   ECHOCARDIOGRAM COMPLETE  Result Date: 04/06/2021    ECHOCARDIOGRAM REPORT   Patient Name:   Hannah French Date of Exam: 04/06/2021 Medical Rec #:  [id number]              Height:       59.0 in Accession #:    [id number]             Weight:       112.4 lb Date of  Birth:  June 01, 1941              BSA:          1.444 m Patient Age:    80 years               BP:           88/48 mmHg Patient Gender: F                      HR:           85 bpm. Exam Location:  Inpatient Procedure: 2D Echo, Cardiac Doppler and Color Doppler Indications:    CHF  History:        Patient has prior history of Echocardiogram examinations, most                 recent 10/21/2020. CHF and Cardiomyopathy, CAD, PAD and COPD,                 Signs/Symptoms:Shortness of Breath and CKD; Risk Factors:Current                 Smoker, Diabetes and Hypertension.  Sonographer:    Dustin Flock Referring Phys: [id number] [place]  1. Left ventricular ejection fraction, by estimation, is 25 to 30%. The left ventricle has severely decreased function. The left ventricle demonstrates regional wall motion abnormalities with severe septal hypokinesis and basal to mid inferior akinesis.  There is septal-lateral dyssynchrony consistent with LBBB. Left ventricular diastolic parameters are consistent with Grade II diastolic dysfunction (pseudonormalization).  2. Right ventricular systolic function is mildly reduced. The right ventricular size is normal. Tricuspid regurgitation signal is inadequate for assessing PA pressure.  3. Left atrial size was mildly dilated.  4. The mitral valve is normal in structure. Trivial mitral valve regurgitation. No evidence of mitral stenosis.  5. The aortic valve is tricuspid. Aortic valve  regurgitation is not visualized. Mild aortic valve sclerosis is present, with no evidence of aortic valve stenosis.  6. The inferior vena cava is normal in size with <50% respiratory variability, suggesting right atrial pressure of 8 mmHg. FINDINGS  Left Ventricle: Left ventricular ejection fraction, by estimation, is 25 to 30%. The left ventricle has severely decreased function. The left ventricle demonstrates regional wall motion abnormalities. The left ventricular internal cavity size was normal  in size. There is no left ventricular hypertrophy. Left ventricular diastolic parameters are consistent with Grade II diastolic dysfunction (pseudonormalization). Right Ventricle: The right ventricular size is normal. No increase in right ventricular wall thickness. Right ventricular systolic function is mildly reduced. Tricuspid regurgitation signal is inadequate for assessing PA pressure. Left Atrium: Left atrial size was mildly dilated. Right Atrium: Right atrial size was normal in size. Pericardium: There is no evidence of pericardial effusion. Mitral Valve: The mitral valve is normal in structure. Mild mitral annular calcification. Trivial mitral valve regurgitation. No evidence of mitral valve stenosis. Tricuspid Valve: The tricuspid valve is normal in structure. Tricuspid valve regurgitation is trivial. Aortic Valve: The aortic valve is tricuspid. Aortic valve regurgitation is not visualized. Mild aortic valve sclerosis is present, with no evidence of aortic valve stenosis. Aortic valve peak gradient measures 8.6 mmHg. Pulmonic Valve: The pulmonic valve was normal in structure. Pulmonic valve regurgitation is not visualized. Aorta: The aortic root is normal in size and structure. Venous: The inferior vena cava is normal in size with less than 50% respiratory variability, suggesting right atrial pressure of 8  mmHg. IAS/Shunts: No atrial level shunt detected by color flow Doppler.  LEFT VENTRICLE PLAX 2D LVIDd:          5.00 cm  Diastology LVIDs:         3.80 cm  LV e' medial:    5.22 cm/s LV PW:         1.10 cm  LV E/e' medial:  16.5 LV IVS:        1.10 cm  LV e' lateral:   5.33 cm/s LVOT diam:     1.90 cm  LV E/e' lateral: 16.2 LV SV:         72 LV SV Index:   50 LVOT Area:     2.84 cm  RIGHT VENTRICLE RV Basal diam:  2.70 cm RV S prime:     7.18 cm/s TAPSE (M-mode): 1.3 cm LEFT ATRIUM             Index       RIGHT ATRIUM           Index LA diam:        3.50 cm 2.42 cm/m  RA Area:     11.60 cm LA Vol (A2C):   41.7 ml 28.88 ml/m RA Volume:   25.50 ml  17.66 ml/m LA Vol (A4C):   40.1 ml 27.77 ml/m LA Biplane Vol: 42.8 ml 29.64 ml/m  AORTIC VALVE AV Area (Vmax): 2.14 cm AV Vmax:        147.00 cm/s AV Peak Grad:   8.6 mmHg LVOT Vmax:      111.00 cm/s LVOT Vmean:     73.600 cm/s LVOT VTI:       0.253 m  AORTA Ao Root diam: 2.40 cm MITRAL VALVE MV Area (PHT): 5.97 cm    SHUNTS MV Decel Time: 127 msec    Systemic VTI:  0.25 m MV E velocity: 86.30 cm/s  Systemic Diam: 1.90 cm MV A velocity: 79.60 cm/s MV E/A ratio:  1.08 Loralie Champagne MD Electronically signed by Loralie Champagne MD Signature Date/Time: 04/06/2021/4:22:38 PM    Final      Medications:     Scheduled Medications:  albuterol  10 mg/hr Nebulization Once   arformoterol  15 mcg Nebulization BID   budesonide (PULMICORT) nebulizer solution  0.25 mg Nebulization BID   chlorhexidine gluconate (MEDLINE KIT)  15 mL Mouth Rinse BID   Chlorhexidine Gluconate Cloth  6 each Topical Q0600   feeding supplement (VITAL HIGH PROTEIN)  1,000 mL Per Tube Q24H   furosemide  80 mg Intravenous Once   heparin  5,000 Units Subcutaneous Q8H   insulin aspart  0-20 Units Subcutaneous Q4H   insulin glargine  10 Units Subcutaneous Once   insulin glargine  15 Units Subcutaneous QHS   mouth rinse  15 mL Mouth Rinse 10 times per day   methylPREDNISolone (SOLU-MEDROL) injection  40 mg Intravenous Q12H   pantoprazole (PROTONIX) IV  40 mg Intravenous Q24H   polyethylene glycol  17  g Per Tube Daily   revefenacin  175 mcg Nebulization Daily   sennosides  10 mL Per Tube BID   sodium chloride flush  3 mL Intravenous Q12H    Infusions:  sodium chloride     sodium chloride Stopped (04/06/21 1113)   sodium chloride 10 mL/hr at 04/07/21 0800   azithromycin 500 mg (04/07/21 0817)   cefTRIAXone (ROCEPHIN)  IV     dexmedetomidine (PRECEDEX) IV infusion 1.2 mcg/kg/hr (04/07/21 0800)   fentaNYL infusion INTRAVENOUS  50 mcg/hr (04/07/21 0800)   midazolam Stopped (04/06/21 1604)   norepinephrine (LEVOPHED) Adult infusion Stopped (04/07/21 0400)    PRN Medications: sodium chloride, acetaminophen **OR** acetaminophen, dextrose, docusate, fentaNYL (SUBLIMAZE) injection, fentaNYL (SUBLIMAZE) injection, midazolam, ondansetron **OR** ondansetron (ZOFRAN) IV, polyethylene glycol, sodium chloride flush   Assessment/Plan:   1. Acute hypoxic/hypercarbic respiratory failure - Likely multifactorial and related to acute on chronic HF, COPD exacerbation and superimposed pneumonia. - CVP 13. PCT 1.05, CXR suggestive of pulmonary edema - Improved this am. Will attempt extubation - Continue abx for likely PNA/sepsis - Will give lasix 80 IV (CVP normal on RHC in 1/22 - no underlying PAH)  2. Acute on chronic HFrEF. Nonischemic cardiomyopathy - Echo 1/21 EF 40-45% - Echo 04/06/21 EF 25-30% RV mildly reduced. + dyssynchrony - Co-ox 73% - ? LBBB cardiomyopathy. Has dyssynchrony on echo but LBBB only 150m. Will d/w EP - Diurese as able. May be limited by ATN - Has been unable to tolerate GDMT due to low BP  3. AKI. - baseline Scr ~ 1.0. - Now 2.4-> 2.\7 - given lactic acidosis on admit suspect may have had component of sepsis -> hypotension  - continue supportive care   4. Diabetes with hyperglycemia - Glucose of 496 shortly after initial presentation - Per ccm   5. PVD - Continue Plavix  6. COPD with ongoing tobacco use - have discussed cessation many times with  her  CRITICAL CARE Performed by: BGlori Bickers Total critical care time: 45 minutes  Critical care time was exclusive of separately billable procedures and treating other patients.  Critical care was necessary to treat or prevent imminent or life-threatening deterioration.  Critical care was time spent personally by me (independent of midlevel providers or residents) on the following activities: development of treatment plan with patient and/or surrogate as well as nursing, discussions with consultants, evaluation of patient's response to treatment, examination of patient, obtaining history from patient or surrogate, ordering and performing treatments and interventions, ordering and review of laboratory studies, ordering and review of radiographic studies, pulse oximetry and re-evaluation of patient's condition.     Length of Stay: 1   DGlori BickersMD 04/07/2021, 8:40 AM  Advanced Heart Failure Team Pager [phone](M-F; [facility]  Please contact [facility] for night-coverage after hours (4p -7a ) and weekends on [email or web address]

## 2021-04-07 NOTE — Evaluation (Signed)
Clinical/Bedside Swallow Evaluation Patient Details  Name: Hannah French MRN: [id number] Date of Birth: 04-Nov-1940  Today's Date: 04/07/2021 Time: SLP Start Time (ACUTE ONLY): 50 SLP Stop Time (ACUTE ONLY): 1040 SLP Time Calculation (min) (ACUTE ONLY): 20 min  Past Medical History:  Past Medical History:  Diagnosis Date   Abdominal bruit    Bladder cancer ([place])    CAD (coronary artery disease)    a. nonobstructive by cath 08/2018.   Carotid bruit    Chronic combined systolic and diastolic CHF (congestive heart failure) (HCC)    CKD (chronic kidney disease), stage II    Congestive heart failure (CHF) (HCC)    Diabetes mellitus    Type II   Dyslipidemia    Heart murmur    Mild pulmonary hypertension (H[facility])    Mitral valve prolapse    a. not seen on most recent echoes. Mild MR now.   NICM (nonischemic cardiomyopathy) ([place])    Osteoporosis    Pancreatitis, acute    Pseudogout    PVD (peripheral vascular disease) ([place])    a. 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   Sinus tachycardia    Tobacco abuse    Past Surgical History:  Past Surgical History:  Procedure Laterality Date   ABDOMINAL AORTOGRAM W/LOWER EXTREMITY N/A 12/31/2019   Procedure: ABDOMINAL AORTOGRAM W/LOWER EXTREMITY;  Surgeon: Wellington Hampshire, MD;  Location: [facility];  Service: Cardiovascular;  Laterality: N/A;   ABDOMINAL AORTOGRAM W/LOWER EXTREMITY N/A 09/01/2020   Procedure: ABDOMINAL AORTOGRAM W/LOWER EXTREMITY;  Surgeon: Wellington Hampshire, MD;  Location: [facility];  Service: Cardiovascular;  Laterality: N/A;   APPENDECTOMY     CORONARY STENT INTERVENTION N/A 10/13/2020   Procedure: CORONARY STENT INTERVENTION;  Surgeon: Leonie Man, MD;  Location: [facility];  Service: Cardiovascular;  Laterality: N/A;   Hysterectomy-type unspecified     INTRAVASCULAR PRESSURE WIRE/FFR STUDY N/A 10/13/2020   Procedure: INTRAVASCULAR  PRESSURE WIRE/FFR STUDY;  Surgeon: Leonie Man, MD;  Location: [facility];  Service: Cardiovascular;  Laterality: N/A;   LAPAROSCOPIC CHOLECYSTECTOMY  2009   PERIPHERAL VASCULAR INTERVENTION  12/31/2019   Procedure: PERIPHERAL VASCULAR INTERVENTION;  Surgeon: Wellington Hampshire, MD;  Location: [facility];  Service: Cardiovascular;;  Bilateral Iliacs   RIGHT/LEFT HEART CATH AND CORONARY ANGIOGRAPHY N/A 09/16/2018   Procedure: RIGHT/LEFT HEART CATH AND CORONARY ANGIOGRAPHY;  Surgeon: Sherren Mocha, MD;  Location: [facility];  Service: Cardiovascular;  Laterality: N/A;   RIGHT/LEFT HEART CATH AND CORONARY ANGIOGRAPHY N/A 10/13/2020   Procedure: RIGHT/LEFT HEART CATH AND CORONARY ANGIOGRAPHY;  Surgeon: Jolaine Artist, MD;  Location: [facility];  Service: Cardiovascular;  Laterality: N/A;   HPI:  80 year old female, long term smoker, presented to ED 7/13 with acute hypercarbic and acute on chronic hypoxemic resp failure; intubated in ED, extubated 7/14. PMHx CAD, CHF, CKD, DM II,   Assessment / Plan / Recommendation Clinical Impression  Pt extubated this am after ~24 hours.  She was alert, communicative, confused; her daughter was at bedside.  Sp02 ranged 92-96% on 3L [place]; RR 14.  Oral mechanism exam normal excluding xerostomia.  Pt presented initially with mild coughing/throat clearing when drinking water.  After opportunities to clear secretions, performance improved.  Pt consumed multiple trials of thin water from a straw with no further s/s of aspiration, adequate synchrony of swallow/respiratory cycles, and loud/clear voice (not wet, but hoarse). Recommend beginning  clear liquids; may give necessary meds whole in puree. SLP will follow briefly for safety/diet progression. D/W daugher and RN. SLP Visit Diagnosis: Dysphagia, pharyngeal phase (R13.13)    Aspiration Risk  Mild aspiration risk    Diet Recommendation   Clear liquids  Medication Administration:  Whole meds with puree    Other  Recommendations Oral Care Recommendations: Oral care BID   Follow up Recommendations None      Frequency and Duration min 2x/week  1 week       Prognosis Prognosis for Safe Diet Advancement: Good      Swallow Study   General Date of Onset: 04/06/21 HPI: 80 year old female, long term smoker, presented to ED 7/13 with acute hypercarbic and acute on chronic hypoxemic resp failure; intubated in ED, extubated 7/14. PMHx CAD, CHF, CKD, DM II, Type of Study: Bedside Swallow Evaluation Previous Swallow Assessment: no Diet Prior to this Study: NPO Temperature Spikes Noted: No Respiratory Status: Nasal cannula History of Recent Intubation: Yes Length of Intubations (days): 1 days Date extubated: 04/07/21 Behavior/Cognition: Alert;Cooperative;Confused Oral Cavity Assessment: Dry Oral Care Completed by SLP: Recent completion by staff Oral Cavity - Dentition: Missing dentition Self-Feeding Abilities: Needs assist Patient Positioning: Upright in bed Baseline Vocal Quality: Hoarse Volitional Cough: Strong Volitional Swallow: Able to elicit    Oral/Motor/Sensory Function Overall Oral Motor/Sensory Function: Within functional limits   Ice Chips Ice chips: Within functional limits   Thin Liquid Thin Liquid: Within functional limits    Nectar Thick Nectar Thick Liquid: Not tested   Honey Thick Honey Thick Liquid: Not tested   Puree Puree: Not tested   Solid     Solid: Not tested     Estill Bamberg L. Tivis Ringer, [place] Office number [phone] Pager [phone]  Juan Quam Laurice 04/07/2021,10:42 AM

## 2021-04-07 NOTE — Progress Notes (Signed)
[facility] Progress Note Patient Name: Hannah French DOB: [birth year] MRN: [id number]   Date of Service  04/07/2021  HPI/Events of Note  Patient is reaching for her ET tube and is at risk for self-extubation.  eICU Interventions  Bilateral soft wrist restraints ordered.        Kerry Kass [name] 04/07/2021, 6:15 AM

## 2021-04-07 NOTE — Plan of Care (Signed)

## 2021-04-07 NOTE — Progress Notes (Signed)
Referred pt to CCM  e link, pt was agitated and restless with slight stimulation the whole night, tried to go out of bed, and tried to remove contraption, need to control by 2 to 3 person, asked to restraint the pt, Ms Jem agreed and will order for restraint

## 2021-04-07 NOTE — Progress Notes (Signed)
PCCM Progress Note  NAME:  Hannah French, MRN:  [id number], DOB:  Oct 12, 1940, LOS: 1 ADMISSION DATE:  04/06/2021, CONSULTATION DATE: 04/06/2021 REFERRING MD: [facility] ED PA, CHIEF COMPLAINT: Monitor dependent respiratory failure, hypotension, acute exacerbation of COPD in the setting of a 80 year old who still smokes  History of Present Illness:  80 year old female who is a lifelong smoker and is followed by pulmonary by Dr. Melvyn Novas and by Dr. Haroldine Laws of the cardiological heart failure team.  She was in her normal state of poor health until approximately 5 days ago when she noticed increasing lower extremity edema which she call for appointment with cardiology.  She had increasing shortness of breath and weakness was seen by her primary care physician and given steroids. Evaluated in the emergency department found to be increasingly short of breath hypoxic acute distress at which time she was intubated by the emergency department physician.  Due to the fact she has known systolic heart failure followed by heart failure team with a known EF of 35 to 40% and with coronary artery disease status post stent placement with a left bundle branch block.  She cannot utilize beta-blockers due to to a chronically low blood pressure.  On 04/06/2021 she is being transferred to [facility] to the heart unit for further evaluation and treatment.  She will be transferred to the pulmonary critical care team at [facility].  We will asked the heart failure team to sign and and weigh options for her continued care.  Of note she was given vancomycin lactam and Zithromax in the emergency department her procalcitonin was noted to be less than 0.1 she does have leukocytosis but has been on steroids for 5 days.  Pertinent  Medical History   Past Medical History:  Diagnosis Date   Abdominal bruit    Bladder cancer ([place])    CAD (coronary artery disease)    a. nonobstructive by cath 08/2018.   Carotid bruit     Chronic combined systolic and diastolic CHF (congestive heart failure) (HCC)    CKD (chronic kidney disease), stage II    Congestive heart failure (CHF) (HCC)    Diabetes mellitus    Type II   Dyslipidemia    Heart murmur    Mild pulmonary hypertension (HCC)    Mitral valve prolapse    a. not seen on most recent echoes. Mild MR now.   NICM (nonischemic cardiomyopathy) ([place])    Osteoporosis    Pancreatitis, acute    Pseudogout    PVD (peripheral vascular disease) ([place])    a. 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   Sinus tachycardia    Tobacco abuse      Significant Hospital Events: Including procedures, antibiotic start and stop dates in addition to other pertinent events   Seen at [facility] urgency department 7/1 13 /2022 and required intubation. 04/07/2019 transferred to [facility] for further evaluation and treatment  Interim History / Subjective:  More wheezy this AM Reaching for tube Minimal vent settings  Objective   Blood pressure (!) 116/50, pulse 98, temperature 99.32 F (37.4 C), resp. rate (!) 26, height (P) 4\' 11"  (1.499 m), weight 57.4 kg, SpO2 98 %. CVP:  [9 mmHg-14 mmHg] 9 mmHg  Vent Mode: PRVC FiO2 (%):  [40 %] 40 % Set Rate:  [22 bmp-28 bmp] 28 bmp Vt Set:  [340 mL] 340 mL PEEP:  [5 Henlawson Pressure:  [14 cmH20-19  cmH20] 17 cmH20   Intake/Output Summary (Last 24 hours) at 04/07/2021 0803 Last data filed at 04/07/2021 0600 Gross per 24 hour  Intake 1636.8 ml  Output 1470 ml  Net 166.8 ml   Filed Weights   04/06/21 0300 04/07/21 0500  Weight: 51 kg 57.4 kg    Examination: Constitutional: chronically ill woman in NAD  Eyes: pupils small, tracking Ears, nose, mouth, and throat: ETT in place, minimal thick secretions Cardiovascular: RRR, ext warm Respiratory: wheezing bilaterally today, no air trapping on vent Gastrointestinal: soft, +BS Skin: No rashes, normal  turgor Neurologic: moves all 4 ext, not following commands at present Psychiatric: RASS 0  BUN/Cr slightly worse Aeration looks better on CXR Pct now 1.03?  Resolved Hospital Problem list   N/a  Assessment & Plan:  Acute hypercarbic and acute on chronic hypoxemic respiratory failure secondary to some combination of AECOPD and volume overload. Acute renal injury- on CKD, thought to be ATN Baseline cardiomyopathy Muscular deconditioning Advanced PVD Protein calorie malnutrition DM2- with hyperglycemia  - Brovana/Yupelri/Pulmicort - Continue IV steroids 1 more day - Diuretics per CHF team - Will do ceftriaxone/azithromycin for presumed infectious bronchitis, f/u sputum culture - Consider extubation to BIPAP later today depending on how she looks - Lantus x 1 right now, may need additional to the lantus 15 units qHS, requirements will be variable due to PO and steroids - PT/OT once off vent - Should probably stop smoking  Best Practice (right click and "Reselect all SmartList Selections" daily)   Diet/type: trickle feeds DVT prophylaxis: prophylactic heparin  GI prophylaxis: PPI Lines: Central line Foley:  Yes, and it is still needed Code Status:  full code Last date of multidisciplinary goals of care discussion- daughter updated at bedside 7/14   Patient critically ill due to respiratory failure Interventions to address this today vent titration Risk of deterioration without these interventions is high  I personally spent 35 minutes providing critical care not including any separately billable procedures  Erskine Emery MD [place] [facility]  Prefer epic messenger for cross cover needs If after hours, please call E-link

## 2021-04-08 DIAGNOSIS — J9601 Acute respiratory failure with hypoxia: Secondary | ICD-10-CM | POA: Diagnosis not present

## 2021-04-08 DIAGNOSIS — I5023 Acute on chronic systolic (congestive) heart failure: Secondary | ICD-10-CM | POA: Diagnosis not present

## 2021-04-08 DIAGNOSIS — J9622 Acute and chronic respiratory failure with hypercapnia: Secondary | ICD-10-CM | POA: Diagnosis not present

## 2021-04-08 DIAGNOSIS — J9602 Acute respiratory failure with hypercapnia: Secondary | ICD-10-CM | POA: Diagnosis not present

## 2021-04-08 DIAGNOSIS — J9621 Acute and chronic respiratory failure with hypoxia: Secondary | ICD-10-CM | POA: Diagnosis not present

## 2021-04-08 LAB — COMPREHENSIVE METABOLIC PANEL
ALT: 37 U/L (ref 0–44)
AST: 19 U/L (ref 15–41)
Albumin: 2.9 g/dL — ABNORMAL LOW (ref 3.5–5.0)
Alkaline Phosphatase: 35 U/L — ABNORMAL LOW (ref 38–126)
Anion gap: 8 (ref 5–15)
BUN: 59 mg/dL — ABNORMAL HIGH (ref 8–23)
CO2: 24 mmol/L (ref 22–32)
Calcium: 8.4 mg/dL — ABNORMAL LOW (ref 8.9–10.3)
Chloride: 105 mmol/L (ref 98–111)
Creatinine, Ser: 2.27 mg/dL — ABNORMAL HIGH (ref 0.44–1.00)
GFR, Estimated: 21 mL/min — ABNORMAL LOW (ref >60)
Glucose, Bld: 126 mg/dL — ABNORMAL HIGH (ref 70–99)
Potassium: 4.1 mmol/L (ref 3.5–5.1)
Sodium: 137 mmol/L (ref 135–145)
Total Bilirubin: 0.6 mg/dL (ref 0.3–1.2)
Total Protein: 5.2 g/dL — ABNORMAL LOW (ref 6.5–8.1)

## 2021-04-08 LAB — CBC WITH DIFFERENTIAL/PLATELET
Abs Immature Granulocytes: 0.13 10*3/uL — ABNORMAL HIGH (ref 0.00–0.07)
Basophils Absolute: 0 10*3/uL (ref 0.0–0.1)
Basophils Relative: 0 %
Eosinophils Absolute: 0 10*3/uL (ref 0.0–0.5)
Eosinophils Relative: 0 %
HCT: 31.7 % — ABNORMAL LOW (ref 36.0–46.0)
Hemoglobin: 9.6 g/dL — ABNORMAL LOW (ref 12.0–15.0)
Immature Granulocytes: 1 %
Lymphocytes Relative: 5 %
Lymphs Abs: 0.7 10*3/uL (ref 0.7–4.0)
MCH: 31.7 pg (ref 26.0–34.0)
MCHC: 30.3 g/dL (ref 30.0–36.0)
MCV: 104.6 fL — ABNORMAL HIGH (ref 80.0–100.0)
Monocytes Absolute: 0.7 10*3/uL (ref 0.1–1.0)
Monocytes Relative: 5 %
Neutro Abs: 13.6 10*3/uL — ABNORMAL HIGH (ref 1.7–7.7)
Neutrophils Relative %: 89 %
Platelets: 337 10*3/uL (ref 150–400)
RBC: 3.03 MIL/uL — ABNORMAL LOW (ref 3.87–5.11)
RDW: 23.9 % — ABNORMAL HIGH (ref 11.5–15.5)
WBC: 15.2 10*3/uL — ABNORMAL HIGH (ref 4.0–10.5)
nRBC: 0.4 % — ABNORMAL HIGH (ref 0.0–0.2)

## 2021-04-08 LAB — COOXEMETRY PANEL
Carboxyhemoglobin: 1.3 % (ref 0.5–1.5)
Methemoglobin: 0.9 % (ref 0.0–1.5)
O2 Saturation: 71 %
Total hemoglobin: 9.1 g/dL — ABNORMAL LOW (ref 12.0–16.0)

## 2021-04-08 LAB — LEGIONELLA PNEUMOPHILA SEROGP 1 UR AG
L. pneumophila Serogp 1 Ur Ag: NEGATIVE
L. pneumophila Serogp 1 Ur Ag: NEGATIVE

## 2021-04-08 LAB — GLUCOSE, CAPILLARY
Glucose-Capillary: 130 mg/dL — ABNORMAL HIGH (ref 70–99)
Glucose-Capillary: 147 mg/dL — ABNORMAL HIGH (ref 70–99)
Glucose-Capillary: 270 mg/dL — ABNORMAL HIGH (ref 70–99)
Glucose-Capillary: 281 mg/dL — ABNORMAL HIGH (ref 70–99)
Glucose-Capillary: 297 mg/dL — ABNORMAL HIGH (ref 70–99)
Glucose-Capillary: 52 mg/dL — ABNORMAL LOW (ref 70–99)
Glucose-Capillary: 63 mg/dL — ABNORMAL LOW (ref 70–99)
Glucose-Capillary: 97 mg/dL (ref 70–99)

## 2021-04-08 MED ORDER — FUROSEMIDE 10 MG/ML IJ SOLN
40.0000 mg | Freq: Two times a day (BID) | INTRAMUSCULAR | Status: DC
  Administered 2021-04-08 – 2021-04-11 (×7): 40 mg via INTRAVENOUS
  Filled 2021-04-08 (×7): qty 4

## 2021-04-08 MED ORDER — AZITHROMYCIN 250 MG PO TABS
250.0000 mg | ORAL_TABLET | Freq: Every day | ORAL | Status: DC
  Filled 2021-04-08: qty 1

## 2021-04-08 MED ORDER — POTASSIUM CHLORIDE CRYS ER 20 MEQ PO TBCR
20.0000 meq | EXTENDED_RELEASE_TABLET | Freq: Once | ORAL | Status: DC
  Filled 2021-04-08: qty 1

## 2021-04-08 MED ORDER — INSULIN ASPART 100 UNIT/ML IJ SOLN
0.0000 [IU] | INTRAMUSCULAR | Status: DC
  Administered 2021-04-08 (×3): 8 [IU] via SUBCUTANEOUS
  Administered 2021-04-09: 2 [IU] via SUBCUTANEOUS
  Administered 2021-04-09 – 2021-04-10 (×3): 5 [IU] via SUBCUTANEOUS

## 2021-04-08 MED ORDER — POTASSIUM CHLORIDE 20 MEQ PO PACK
20.0000 meq | PACK | Freq: Once | ORAL | Status: AC
  Administered 2021-04-08: 20 meq via ORAL
  Filled 2021-04-08: qty 1

## 2021-04-08 MED ORDER — AZITHROMYCIN 250 MG PO TABS
250.0000 mg | ORAL_TABLET | Freq: Every day | ORAL | Status: AC
  Administered 2021-04-09 – 2021-04-10 (×2): 250 mg via ORAL
  Filled 2021-04-08 (×2): qty 1

## 2021-04-08 MED ORDER — FUROSEMIDE 10 MG/ML IJ SOLN: 40.0000 mg | Freq: Two times a day (BID) | INTRAMUSCULAR | Status: DC

## 2021-04-08 MED ORDER — ACETAMINOPHEN 160 MG/5ML PO SOLN
650.0000 mg | Freq: Four times a day (QID) | ORAL | Status: DC | PRN
  Administered 2021-04-11: 650 mg via ORAL
  Filled 2021-04-08: qty 20.3

## 2021-04-08 NOTE — Progress Notes (Addendum)
Advanced Heart Failure Rounding Note   Subjective:    Echo 04/06/21 EF 25-30% RV mildly reduced 7/14 Extubated off norepi. Given 80 mg IV lasix x1. Given steroids.    Complaining of cough.   Objective:   Weight Range:  Vital Signs:   Temp:  [97.8 F (36.6 C)-99.5 F (37.5 C)] 98.3 F (36.8 C) (07/15 0635) Pulse Rate:  [96-117] 98 (07/15 0730) Resp:  [12-25] 14 (07/15 0600) BP: (94-118)/(41-81) 108/45 (07/15 0730) SpO2:  [92 %-100 %] 99 % (07/15 0832) Weight:  [58.7 kg] 58.7 kg (07/15 0500) Last BM Date:  (PTA)  Weight change: Filed Weights   04/06/21 0300 04/07/21 0500 04/08/21 0500  Weight: 51 kg 57.4 kg 58.7 kg    Intake/Output:   Intake/Output Summary (Last 24 hours) at 04/08/2021 0907 Last data filed at 04/08/2021 0400 Gross per 24 hour  Intake 527.87 ml  Output 1430 ml  Net -902.13 ml    CVP 10  Physical Exam: General:  In bed.  HEENT: normal Neck: supple. JVP 9-10 . Carotids 2+ bilat; no bruits. No lymphadenopathy or thryomegaly appreciated. LIJ  Cor: PMI nondisplaced. Regular rate & rhythm. No rubs, gallops or murmurs. Lungs: EW throughout on 2 liters.  Abdomen: soft, nontender, nondistended. No hepatosplenomegaly. No bruits or masses. Good bowel sounds. Extremities: no cyanosis, clubbing, rash, edema Neuro: alert & orientedx3, cranial nerves grossly intact. moves all 4 extremities w/o difficulty. Affect flat   Telemetry: NSR -ST 90s with brief SVT   Labs: Basic Metabolic Panel: Recent Labs  Lab 04/01/21 1450 04/01/21 1450 04/06/21 0347 04/06/21 0857 04/06/21 1226 04/06/21 1304 04/07/21 0445 04/07/21 0712 04/08/21 0530  NA 140   < > 136  --  139 140 138 135 137  K 4.5  --  4.9  --  3.5 3.7 5.1 4.9 4.1  CL 97  --  98  --   --  103 104  --  105  CO2 27  --  27  --   --  27 24  --  24  GLUCOSE 151*  --  496*  --   --  148* 223*  --  126*  BUN 29*  --  56*  --   --  60* 65*  --  59*  CREATININE 1.53*  --  2.13* 2.37*  --  2.38* 2.59*  --   2.27*  CALCIUM 9.6  --  8.7*  --   --  9.0 8.7*  --  8.4*  MG  --   --   --   --   --  2.4 2.4  --   --   PHOS  --   --   --   --   --  4.2  --   --   --    < > = values in this interval not displayed.    Liver Function Tests: Recent Labs  Lab 04/01/21 1450 04/06/21 1304 04/07/21 0445 04/08/21 0530  AST 13 53* 27 19  ALT 10 68* 51* 37  ALKPHOS 64 42 38 35*  BILITOT 0.6 0.8 1.3* 0.6  PROT 6.4 5.8* 5.6* 5.2*  ALBUMIN 4.4 3.6 3.1* 2.9*   Recent Labs  Lab 04/06/21 1304  LIPASE 29  AMYLASE 57   No results for input(s): AMMONIA in the last 168 hours.  CBC: Recent Labs  Lab 04/01/21 1450 04/06/21 0347 04/06/21 0347 04/06/21 0857 04/06/21 1226 04/06/21 1304 04/07/21 0445 04/07/21 0712 04/08/21 0530  WBC 11.2* 16.1*  --  22.4*  --  20.6* 14.8*  --  15.2*  NEUTROABS 8.2* 12.3*  --   --   --   --   --   --  13.6*  HGB 11.1 11.9*   < > 10.1* 11.9* 9.9* 9.7* 10.5* 9.6*  HCT 33.9* 40.2   < > 34.1* 35.0* 32.5* 31.8* 31.0* 31.7*  MCV 95 107.2*  --  106.6*  --  103.2* 103.2*  --  104.6*  PLT 448 651*  --  455*  --  436* 363  --  337   < > = values in this interval not displayed.    Cardiac Enzymes: No results for input(s): CKTOTAL, CKMB, CKMBINDEX, TROPONINI in the last 168 hours.  BNP: BNP (last 3 results) Recent Labs    01/19/21 0522 02/19/21 1943 04/06/21 0347  BNP 288.0* 179.0* 918.0*    ProBNP (last 3 results) No results for input(s): PROBNP in the last 8760 hours.    Other results:  Imaging: DG CHEST PORT 1 VIEW  Result Date: 04/07/2021 CLINICAL DATA:  ET tube EXAM: PORTABLE CHEST 1 VIEW COMPARISON:  None.  April 06, 2021. FINDINGS: Endotracheal tube tip is approximately 4.2 cm above the carina. Left IJ approach central venous catheter with the tip projecting in the region of the superior cavoatrial junction, similar to prior. Gastric tube courses below the diaphragm with the tip outside the field of view. Similar cardiomediastinal silhouette. Aortic  atherosclerosis. Suspected slight improvement in now mild prominent interstitial markings, similar to the prior. No new consolidation. No visible pleural effusions or pneumothorax on this single AP semierect radiograph. No visible pleural effusions or pneumothorax. No acute osseous abnormality. IMPRESSION: 1. Suspected slight improvement in mildly prominent interstitial markings. 2. Support devices as detailed above. Electronically Signed   By: Margaretha Sheffield MD   On: 04/07/2021 09:07   DG CHEST PORT 1 VIEW  Result Date: 04/06/2021 CLINICAL DATA:  Central line placement. EXAM: PORTABLE CHEST 1 VIEW COMPARISON:  April 06, 2021. FINDINGS: LEFT-sided IJ central venous line terminates at the caval to atrial junction. Endotracheal tube terminates approximately 3.6 cm above the carina. Gastric tube with side port below GE junction, tip off the field of view. Cardiomediastinal contours and hilar structures are stable. Structure overlies the LEFT upper chest in there is also likely in associated skin fold in this area. No sign of pneumothorax. No effusion on frontal radiograph. Increased interstitial markings may be slightly improved compared with previous imaging accounting for differences in technique. On limited assessment no acute skeletal process. IMPRESSION: 1. LEFT IJ central venous line terminates at the caval to atrial junction. No sign of pneumothorax. 2. Additional support apparatus without change. 3. Potential improvement with respect to interstitial markings accounting for differences in technique, attention on follow-up. Electronically Signed   By: Zetta Bills M.D.   On: 04/06/2021 12:40   ECHOCARDIOGRAM COMPLETE  Result Date: 04/06/2021    ECHOCARDIOGRAM REPORT   Patient Name:   Hannah French Date of Exam: 04/06/2021 Medical Rec #:  [id number]              Height:       59.0 in Accession #:    [id number]             Weight:       112.4 lb Date of Birth:  03-20-1941              BSA:  1.444 m Patient Age:    80 years               BP:           88/48 mmHg Patient Gender: F                      HR:           85 bpm. Exam Location:  Inpatient Procedure: 2D Echo, Cardiac Doppler and Color Doppler Indications:    CHF  History:        Patient has prior history of Echocardiogram examinations, most                 recent 10/21/2020. CHF and Cardiomyopathy, CAD, PAD and COPD,                 Signs/Symptoms:Shortness of Breath and CKD; Risk Factors:Current                 Smoker, Diabetes and Hypertension.  Sonographer:    Dustin Flock Referring Phys: [id number] [facility] [place]  1. Left ventricular ejection fraction, by estimation, is 25 to 30%. The left ventricle has severely decreased function. The left ventricle demonstrates regional wall motion abnormalities with severe septal hypokinesis and basal to mid inferior akinesis.  There is septal-lateral dyssynchrony consistent with LBBB. Left ventricular diastolic parameters are consistent with Grade II diastolic dysfunction (pseudonormalization).  2. Right ventricular systolic function is mildly reduced. The right ventricular size is normal. Tricuspid regurgitation signal is inadequate for assessing PA pressure.  3. Left atrial size was mildly dilated.  4. The mitral valve is normal in structure. Trivial mitral valve regurgitation. No evidence of mitral stenosis.  5. The aortic valve is tricuspid. Aortic valve regurgitation is not visualized. Mild aortic valve sclerosis is present, with no evidence of aortic valve stenosis.  6. The inferior vena cava is normal in size with <50% respiratory variability, suggesting right atrial pressure of 8 mmHg. FINDINGS  Left Ventricle: Left ventricular ejection fraction, by estimation, is 25 to 30%. The left ventricle has severely decreased function. The left ventricle demonstrates regional wall motion abnormalities. The left ventricular internal cavity size was normal  in size. There is no left ventricular  hypertrophy. Left ventricular diastolic parameters are consistent with Grade II diastolic dysfunction (pseudonormalization). Right Ventricle: The right ventricular size is normal. No increase in right ventricular wall thickness. Right ventricular systolic function is mildly reduced. Tricuspid regurgitation signal is inadequate for assessing PA pressure. Left Atrium: Left atrial size was mildly dilated. Right Atrium: Right atrial size was normal in size. Pericardium: There is no evidence of pericardial effusion. Mitral Valve: The mitral valve is normal in structure. Mild mitral annular calcification. Trivial mitral valve regurgitation. No evidence of mitral valve stenosis. Tricuspid Valve: The tricuspid valve is normal in structure. Tricuspid valve regurgitation is trivial. Aortic Valve: The aortic valve is tricuspid. Aortic valve regurgitation is not visualized. Mild aortic valve sclerosis is present, with no evidence of aortic valve stenosis. Aortic valve peak gradient measures 8.6 mmHg. Pulmonic Valve: The pulmonic valve was normal in structure. Pulmonic valve regurgitation is not visualized. Aorta: The aortic root is normal in size and structure. Venous: The inferior vena cava is normal in size with less than 50% respiratory variability, suggesting right atrial pressure of 8 mmHg. IAS/Shunts: No atrial level shunt detected by color flow Doppler.  LEFT VENTRICLE PLAX 2D LVIDd:         5.00  cm  Diastology LVIDs:         3.80 cm  LV e' medial:    5.22 cm/s LV PW:         1.10 cm  LV E/e' medial:  16.5 LV IVS:        1.10 cm  LV e' lateral:   5.33 cm/s LVOT diam:     1.90 cm  LV E/e' lateral: 16.2 LV SV:         72 LV SV Index:   50 LVOT Area:     2.84 cm  RIGHT VENTRICLE RV Basal diam:  2.70 cm RV S prime:     7.18 cm/s TAPSE (M-mode): 1.3 cm LEFT ATRIUM             Index       RIGHT ATRIUM           Index LA diam:        3.50 cm 2.42 cm/m  RA Area:     11.60 cm LA Vol (A2C):   41.7 ml 28.88 ml/m RA Volume:    25.50 ml  17.66 ml/m LA Vol (A4C):   40.1 ml 27.77 ml/m LA Biplane Vol: 42.8 ml 29.64 ml/m  AORTIC VALVE AV Area (Vmax): 2.14 cm AV Vmax:        147.00 cm/s AV Peak Grad:   8.6 mmHg LVOT Vmax:      111.00 cm/s LVOT Vmean:     73.600 cm/s LVOT VTI:       0.253 m  AORTA Ao Root diam: 2.40 cm MITRAL VALVE MV Area (PHT): 5.97 cm    SHUNTS MV Decel Time: 127 msec    Systemic VTI:  0.25 m MV E velocity: 86.30 cm/s  Systemic Diam: 1.90 cm MV A velocity: 79.60 cm/s MV E/A ratio:  1.08 Loralie Champagne MD Electronically signed by Loralie Champagne MD Signature Date/Time: 04/06/2021/4:22:38 PM    Final      Medications:     Scheduled Medications:  albuterol  10 mg/hr Nebulization Once   arformoterol  15 mcg Nebulization BID   budesonide (PULMICORT) nebulizer solution  0.25 mg Nebulization BID   chlorhexidine  15 mL Mouth Rinse BID   Chlorhexidine Gluconate Cloth  6 each Topical Q0600   heparin  5,000 Units Subcutaneous Q8H   insulin aspart  0-15 Units Subcutaneous Q4H   insulin glargine  15 Units Subcutaneous QHS   mouth rinse  15 mL Mouth Rinse q12n4p   methylPREDNISolone (SOLU-MEDROL) injection  40 mg Intravenous Q12H   nicotine  14 mg Transdermal Daily   pantoprazole (PROTONIX) IV  40 mg Intravenous Q24H   revefenacin  175 mcg Nebulization Daily   sodium chloride flush  3 mL Intravenous Q12H    Infusions:  sodium chloride     sodium chloride Stopped (04/06/21 1113)   sodium chloride 10 mL/hr at 04/07/21 1800   azithromycin 500 mg (04/08/21 0844)   cefTRIAXone (ROCEPHIN)  IV Stopped (04/07/21 1533)   norepinephrine (LEVOPHED) Adult infusion Stopped (04/07/21 1900)    PRN Medications: sodium chloride, acetaminophen (TYLENOL) oral liquid 160 mg/5 mL, albuterol, dextrose, docusate, haloperidol lactate, ondansetron **OR** ondansetron (ZOFRAN) IV, sodium chloride flush   Assessment/Plan:   1. Acute hypoxic/hypercarbic respiratory failure - Likely multifactorial and related to acute on chronic  HF, COPD exacerbation and superimposed pneumonia. - - Continue abx for likely PNA/sepsis. Also on steroids.  - Extubated on 2 liters. EW throughout.   2. Acute on chronic HFrEF. Nonischemic  cardiomyopathy - Echo 1/21 EF 40-45% - Echo 04/06/21 EF 25-30% RV mildly reduced. + dyssynchrony - Co-ox 73% - ? LBBB cardiomyopathy. Has dyssynchrony on echo but LBBB only 138ms. Will d/w EP 7/14 off Norepi. Check CO-OX  -- Has been unable to tolerate GDMT due to low BP - CVP 10 . Give 40 mg IV lasix x1.  - Check CO-OX   3. AKI. - baseline Scr ~ 1.0. - Creatinine 2.6-->2.3 today  - Follow BMET daily    4. Diabetes with hyperglycemia - Glucose of 496 shortly after initial presentation - Per ccm   5. PVD - Continue Plavix  6. COPD with ongoing tobacco use - have discussed cessation many times with her   Length of Stay: 2   Amy Clegg NP-C  04/08/2021, 9:07 AM  Advanced Heart Failure Team Pager [phone] (M-F; 7a - 4p)  Please contact [facility] for night-coverage after hours (4p -7a ) and weekends on [email or web address]   Patient seen and examined with the above-signed Advanced Practice Provider and/or Housestaff. I personally reviewed laboratory data, imaging studies and relevant notes. I independently examined the patient and formulated the important aspects of the plan. I have edited the note to reflect any of my changes or salient points. I have personally discussed the plan with the patient and/or family.  Extubated. Off all pressors. Co-ox 71% CVP 10 Scr improving. Continues with SOB and wheezing.   General:  Sitting up in bed + audible wheezing HEENT: normal Neck: supple. RIJ TLC Carotids 2+ bilat; no bruits. No lymphadenopathy or thryomegaly appreciated. Cor: PMI nondisplaced. Regular rate & rhythm. No rubs, gallops or murmurs. Lungs: + wheezing tight  Abdomen: soft, nontender, nondistended. No hepatosplenomegaly. No bruits or masses. Good bowel sounds. Extremities: no cyanosis,  clubbing, rash, edema Neuro: alert & orientedx3, cranial nerves grossly intact. moves all 4 extremities w/o difficulty. Affect pleasant  Main issue seems to be PNA/COPDE. Agree with gentle diuresis as renal function tolerates.   Suspect she may have LBBB cardiomyopathy. I have d/w EP and they feel she may benefit gfrom CRT. Will evaluate as outpatient.   Glori Bickers, MD  6:18 PM

## 2021-04-08 NOTE — Progress Notes (Signed)
OT Cancellation Note  Patient Details Name: Hannah French MRN: [id number] DOB: 1941/09/11   Cancelled Treatment:    Reason Eval/Treat Not Completed: Other (comment). Pt finally sleeping since not having a good morning. Will re-attempt eval later date.  [place] Circle, OTR/L Acute Rehab Services Pager [phone] Office [phone]    Almon Register 04/08/2021, 10:27 AM

## 2021-04-08 NOTE — Progress Notes (Signed)
OT Cancellation Note  Patient Details Name: Hannah French MRN: [id number] DOB: 1940/12/05   Cancelled Treatment:     Pt feeling nauseated since eating breakfast, RN aware and pt had already been given anti-nausea meds before I entered. Will re-attempt later this AM.  Golden Circle, OTR/L Acute Rehab Services Pager [phone] Office [phone]    Almon Register 04/08/2021, 8:31 AM

## 2021-04-08 NOTE — Progress Notes (Signed)
Bld sugar 63 mg/dl, re checked, bld taken from right IJV= 52 mg/dl, hypoglycemia protocol, given with 8 oz of sprite as preferred by the pt  and to re check bld sugar after 15 mins

## 2021-04-08 NOTE — Progress Notes (Signed)
Pt refused due insulin subcutaneous, since she claimed that most of the night till AM she always had incidence of hypoglycemia and her doctor was aware since she had incidence before even her bld sugar dropped to 3 mg/dl

## 2021-04-08 NOTE — Telephone Encounter (Signed)
-  late entry- Pt aware reports she woke up and felt somewhat better, will review in detail at PCP visit

## 2021-04-08 NOTE — Progress Notes (Signed)
  Speech Language Pathology Treatment: Dysphagia  Patient Details Name: Hannah French MRN: [id number] DOB: 1940/10/16 Today's Date: 04/08/2021 Time: 9688-6484 SLP Time Calculation (min) (ACUTE ONLY): 17 min  Assessment / Plan / Recommendation Clinical Impression  Pt has reports of nausea and gagging this morning, but per RN is only expectorating phlegm - no regurgitation of POs. Pt is eager to try to get something solid on her stomach. She tried thin liquids, purees, and solids with SLP with no overt signs of aspiration or oropharyngeal dysphagia. Several minutes after intake she did have gagging, but still without any regurgitation. While diet modifications are likely not needed from an oropharyngeal standpoint, would advance solids per medical team in light of nausea. Per NP, SLP to advance diet to regular solids and they will monitor for tolerance. Will f/u briefly when pt may be able to consume more POs.   HPI HPI: 80 year old female, long term smoker, presented to ED 7/13 with acute hypercarbic and acute on chronic hypoxemic resp failure; intubated in ED, extubated 7/14. PMHx CAD, CHF, CKD, DM II,      SLP Plan  Continue with current plan of care       Recommendations  Diet recommendations: Regular;Thin liquid Liquids provided via: Cup;Straw Medication Administration: Whole meds with puree Supervision: Patient able to self feed;Intermittent supervision to cue for compensatory strategies Compensations: Slow rate;Small sips/bites Postural Changes and/or Swallow Maneuvers: Seated upright 90 degrees;Upright 30-60 min after meal                Oral Care Recommendations: Oral care BID Follow up Recommendations: None SLP Visit Diagnosis: Dysphagia, pharyngeal phase (R13.13) Plan: Continue with current plan of care       GO                Osie Bond., M.A. [place] [facility] Services Pager [phone] Office [phone]  04/08/2021, 10:16 AM

## 2021-04-08 NOTE — Progress Notes (Addendum)
PCCM Progress Note  NAME:  Hannah French, MRN:  [id number], DOB:  January 02, 1941, LOS: 2 ADMISSION DATE:  04/06/2021, CONSULTATION DATE: 04/06/2021 REFERRING MD: [facility] ED [facility], CHIEF COMPLAINT: Monitor dependent respiratory failure, hypotension, acute exacerbation of COPD in the setting of a 80 year old who still smokes  History of Present Illness:  Hannah French  is a 80 year old female who is a lifelong smoker and is followed by pulmonary by Dr. Melvyn Novas and by Dr. Haroldine Laws of the cardiological heart failure team.  She was in her normal state of poor health until approximately 5 days ago when she noticed increasing lower extremity edema which she call for appointment with cardiology.  She had increasing shortness of breath and weakness was seen by her primary care physician and given steroids.  Evaluated in the emergency department found to be increasingly short of breath hypoxic acute distress at which time she was intubated by the emergency department physician.  Due to the fact she has known systolic heart failure followed by heart failure team with a known EF of 35 to 40% and with coronary artery disease status post stent placement with a left bundle branch block.  She cannot utilize beta-blockers due to to a chronically low blood pressure.    On 04/06/2021 she is being transferred to [facility] to the heart unit for further evaluation and treatment.  She will be transferred to the pulmonary critical care team at [facility].  We will asked the heart failure team to sign and and weigh options for her continued care.  Of note she was given vancomycin lactam and Zithromax in the emergency department her procalcitonin was noted to be less than 0.1 she does have leukocytosis but has been on steroids for 5 days.   Pertinent  Medical History   Past Medical History:  Diagnosis Date   Abdominal bruit    Bladder cancer ([place])    CAD (coronary artery disease)    a. nonobstructive  by cath 08/2018.   Carotid bruit    Chronic combined systolic and diastolic CHF (congestive heart failure) (HCC)    CKD (chronic kidney disease), stage II    Congestive heart failure (CHF) (HCC)    Diabetes mellitus    Type II   Dyslipidemia    Heart murmur    Mild pulmonary hypertension (HCC)    Mitral valve prolapse    a. not seen on most recent echoes. Mild MR now.   NICM (nonischemic cardiomyopathy) ([place])    Osteoporosis    Pancreatitis, acute    Pseudogout    PVD (peripheral vascular disease) ([place])    a. 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   Sinus tachycardia    Tobacco abuse      Significant Hospital Events: Including procedures, antibiotic start and stop dates in addition to other pertinent events   Seen at [facility] urgency department 7/1 13 /2022 and required intubation. 04/07/2019 transferred to [facility] for further evaluation and treatment 7/14 Extubated  Interim History / Subjective:  Extubated 7/14 AM, off Levophed on dayshift 7/14  -589 admit -756 on 7/15  Subjective: Denies chest pain, SOB, denies pain, endorses some nausea  Objective   Blood pressure (!) 108/45, pulse 98, temperature 98.3 F (36.8 C), temperature source Oral, resp. rate 14, height (P) 4\' 11"  (1.499 m), weight 58.7 kg, SpO2 99 %. On nebulizer treatment 90% on RA CVP:  [10 mmHg-15 mmHg] 10 mmHg  Intake/Output Summary (Last 24 hours) at 04/08/2021 0849 Last data filed at 04/08/2021 0400 Gross per 24 hour  Intake 727.97 ml  Output 1460 ml  Net -732.03 ml   Filed Weights   04/06/21 0300 04/07/21 0500 04/08/21 0500  Weight: 51 kg 57.4 kg 58.7 kg    Examination: General:  in bed, NAD, appears comfotable HEENT: MM pink/moist, anicteric, atraumatic Neuro: GCS 15, agitated by orientation questions, RASS 0, PERRL 22mm CV: S1S2, ST on monitor, no m/r/g appreciated PULM:  slight exp wheezes in the upper lobes andin  the lower lobes, Trachea midline, chest expansion symmetric GI: soft, bsx4 active, non tender Extremities: warm/dry, no pretibial edema, capillary refill less than 3 seconds  Skin: no rashes or lesions noted   Resolved Hospital Problem list   N/a  Assessment & Plan:  Acute hypercarbic and acute on chronic hypoxemic respiratory failure- secondary to some combination of AECOPD and volume overload. Hx COPD GOLD II Active Smoker -Goal Spo2 88-96% -Continue brovana, yupelri, and pulmocort -Diuresis per HF team -Continue ceftriaxone/azithromycin for presumed infectious bronchitis, NGTD on sputum culture. Follow up cultures. Adjust as resulted -Last dose solumedrol 40 today -Encourage smoking cessation.  HFrEF NICM Hx PVD -Appreciate HF assistance  -GDMT and management per HF. -Continue plavix  Acute Kidney Injury on CKD Creat 2.59>2.27. Baseline appears to be in the 1.1 to 1.3 range (May 2021) -Ensure renal perfusion. Goal MAP 65 or greater. -Avoid neprotoxic drugs as possible. -Strict I&O's -Follow up AM creatinine  Muscular deconditioning -PT/OT -Appreciate dietary assistance -Advance tolerated   DM2- with hyperglycemia, on steroids Hypoglycemia- BG 52 overnight -Blood Glucose goal 140-180. -SSI resistant, decreasing to  -Continue lantus 15 units.   Best Practice (right click and "Reselect all SmartList Selections" daily)   Diet/type: PO diet DVT prophylaxis: prophylactic heparin  GI prophylaxis: PPI Lines: Central line and yes and it is still needed Foley:  Yes, and it is no longer needed Code Status:  full code Last date of multidisciplinary goals of care discussion- daughter updated at bedside 7/15  [employer]., MSN, APRN, AGACNP-BC Anton Ruiz  04/08/2021 , 9:00 AM  Please see [email or web address] for pager details  If no response, please call [phone] After hours, please call Elink at [phone]

## 2021-04-08 NOTE — Evaluation (Signed)
Physical Therapy Evaluation Patient Details Name: Hannah French MRN: [id number] DOB: [birth year] Today's Date: 04/08/2021   History of Present Illness  Hannah French is a 80 y.o. female presented to the ED 7/13 via EMS for worsening shortness of breath over the last several days which has now led to some confusion.Found to have COPD exacerbation as well as CHF exacerbation. Blood glucose was over 500. Chest xray concerning for PNA.Intubated 7/13. Extubated 7/14.PHMx: nonischemic cardiomyopathy with EF 40%, type 2 diabetes, hypertension, COPD, peripheral vascular disease, GERD, and ongoing tobacco abuse  Clinical Impression  Pt presents to PT with decr mobility and functional activity tolerance. Needs skilled PT to maximize independence and safety. Unless pt progresses quickly she may need SNF.     Follow Up Recommendations SNF (unless pt progresses quickly)    Equipment Recommendations  None recommended by PT    Recommendations for Other Services       Precautions / Restrictions Precautions Precautions: Fall      Mobility  Bed Mobility Overal bed mobility: Needs Assistance Bed Mobility: Supine to Sit     Supine to sit: Min assist     General bed mobility comments: Assist to elevate trunk into sitting    Transfers Overall transfer level: Needs assistance Equipment used: Rolling walker (2 wheeled) Transfers: Sit to/from Stand Sit to Stand: +2 physical assistance;Min assist         General transfer comment: Assist to bring hips up and for balance  Ambulation/Gait Ambulation/Gait assistance: +2 physical assistance;Min assist Gait Distance (Feet): 3 Feet Assistive device: Rolling walker (2 wheeled) Gait Pattern/deviations: Step-through pattern;Decreased step length - right;Decreased step length - left;Shuffle;Trunk flexed Gait velocity: decr Gait velocity interpretation: <1.31 ft/sec, indicative of household ambulator General Gait Details: Assist for  balance and support. Pt fatigued quickly  Stairs            Wheelchair Mobility    Modified Rankin (Stroke Patients Only)       Balance Overall balance assessment: Needs assistance Sitting-balance support: No upper extremity supported;Feet supported Sitting balance-Leahy Scale: Fair     Standing balance support: Bilateral upper extremity supported Standing balance-Leahy Scale: Poor Standing balance comment: walker and min assist for static standing                             Pertinent Vitals/Pain Pain Assessment: Faces Faces Pain Scale: No hurt    Home Living Family/patient expects to be discharged to:: Private residence Living Arrangements: Children Available Help at Discharge: Family Type of Home: House Home Access: Stairs to enter Entrance Stairs-Rails: None Entrance Stairs-Number of Steps: 1 Home Layout: One level Home Equipment: Environmental consultant - 2 wheels;Cane - single point;Bedside commode;Shower seat Additional Comments: Dtr has lived with her since April of this year. All equipment was her dtrs from several years ago.    Prior Function Level of Independence: Independent               Hand Dominance   Dominant Hand: Right    Extremity/Trunk Assessment   Upper Extremity Assessment Upper Extremity Assessment: Defer to OT evaluation    Lower Extremity Assessment Lower Extremity Assessment: Generalized weakness       Communication   Communication: HOH  Cognition Arousal/Alertness: Awake/alert Behavior During Therapy: Restless Overall Cognitive Status: Impaired/Different from baseline Area of Impairment: Attention;Following commands;Safety/judgement;Problem solving;Awareness  Current Attention Level: Sustained   Following Commands: Follows one step commands with increased time Safety/Judgement: Decreased awareness of safety Awareness: Emergent Problem Solving: Requires verbal cues;Requires tactile cues;Slow  processing        General Comments General comments (skin integrity, edema, etc.): Pt on 2L with SpO2 >94% and HR 120's    Exercises     Assessment/Plan    PT Assessment Patient needs continued PT services  PT Problem List Decreased strength;Decreased activity tolerance;Decreased balance;Decreased mobility;Decreased cognition;Decreased knowledge of use of DME;Decreased safety awareness       PT Treatment Interventions DME instruction;Gait training;Functional mobility training;Therapeutic activities;Stair training;Therapeutic exercise;Balance training;Patient/family education    PT Goals (Current goals can be found in the Care Plan section)  Acute Rehab PT Goals Patient Stated Goal: not stated PT Goal Formulation: With patient Time For Goal Achievement: 04/22/21 Potential to Achieve Goals: Fair    Frequency Min 3X/week   Barriers to discharge        Co-evaluation               AM-PAC PT "6 Clicks" Mobility  Outcome Measure Help needed turning from your back to your side while in a flat bed without using bedrails?: A Little Help needed moving from lying on your back to sitting on the side of a flat bed without using bedrails?: A Little Help needed moving to and from a bed to a chair (including a wheelchair)?: Total Help needed standing up from a chair using your arms (e.g., wheelchair or bedside chair)?: Total Help needed to walk in hospital room?: Total Help needed climbing 3-5 steps with a railing? : Total 6 Click Score: 10    End of Session Equipment Utilized During Treatment: Gait belt;Oxygen Activity Tolerance: Patient limited by fatigue Patient left: in chair;with call bell/phone within reach;with chair alarm set;with nursing/sitter in room Nurse Communication: Mobility status PT Visit Diagnosis: Other abnormalities of gait and mobility (R26.89);Unsteadiness on feet (R26.81);Muscle weakness (generalized) (M62.81)    Time: 5885-0277 PT Time Calculation  (min) (ACUTE ONLY): 20 min   Charges:   PT Evaluation $PT Eval Moderate Complexity: [place] Pager [phone] Office [place] 04/08/2021, 5:38 PM

## 2021-04-09 DIAGNOSIS — J9621 Acute and chronic respiratory failure with hypoxia: Secondary | ICD-10-CM | POA: Diagnosis not present

## 2021-04-09 DIAGNOSIS — J9622 Acute and chronic respiratory failure with hypercapnia: Secondary | ICD-10-CM | POA: Diagnosis not present

## 2021-04-09 DIAGNOSIS — I5023 Acute on chronic systolic (congestive) heart failure: Secondary | ICD-10-CM | POA: Diagnosis not present

## 2021-04-09 DIAGNOSIS — J9602 Acute respiratory failure with hypercapnia: Secondary | ICD-10-CM | POA: Diagnosis not present

## 2021-04-09 DIAGNOSIS — J9601 Acute respiratory failure with hypoxia: Secondary | ICD-10-CM | POA: Diagnosis not present

## 2021-04-09 LAB — CBC
HCT: 31.6 % — ABNORMAL LOW (ref 36.0–46.0)
Hemoglobin: 9.7 g/dL — ABNORMAL LOW (ref 12.0–15.0)
MCH: 31.5 pg (ref 26.0–34.0)
MCHC: 30.7 g/dL (ref 30.0–36.0)
MCV: 102.6 fL — ABNORMAL HIGH (ref 80.0–100.0)
Platelets: 324 10*3/uL (ref 150–400)
RBC: 3.08 MIL/uL — ABNORMAL LOW (ref 3.87–5.11)
RDW: 23.9 % — ABNORMAL HIGH (ref 11.5–15.5)
WBC: 10.9 10*3/uL — ABNORMAL HIGH (ref 4.0–10.5)
nRBC: 0.8 % — ABNORMAL HIGH (ref 0.0–0.2)

## 2021-04-09 LAB — GLUCOSE, CAPILLARY
Glucose-Capillary: 106 mg/dL — ABNORMAL HIGH (ref 70–99)
Glucose-Capillary: 202 mg/dL — ABNORMAL HIGH (ref 70–99)
Glucose-Capillary: 220 mg/dL — ABNORMAL HIGH (ref 70–99)
Glucose-Capillary: 242 mg/dL — ABNORMAL HIGH (ref 70–99)
Glucose-Capillary: 243 mg/dL — ABNORMAL HIGH (ref 70–99)
Glucose-Capillary: 92 mg/dL (ref 70–99)
Glucose-Capillary: 96 mg/dL (ref 70–99)

## 2021-04-09 LAB — BASIC METABOLIC PANEL
Anion gap: 6 (ref 5–15)
BUN: 63 mg/dL — ABNORMAL HIGH (ref 8–23)
CO2: 30 mmol/L (ref 22–32)
Calcium: 8.9 mg/dL (ref 8.9–10.3)
Chloride: 103 mmol/L (ref 98–111)
Creatinine, Ser: 2.28 mg/dL — ABNORMAL HIGH (ref 0.44–1.00)
GFR, Estimated: 21 mL/min — ABNORMAL LOW (ref >60)
Glucose, Bld: 95 mg/dL (ref 70–99)
Potassium: 3.9 mmol/L (ref 3.5–5.1)
Sodium: 139 mmol/L (ref 135–145)

## 2021-04-09 LAB — CULTURE, RESPIRATORY W GRAM STAIN
Culture: NORMAL
Gram Stain: NONE SEEN

## 2021-04-09 MED ORDER — FLUTICASONE-UMECLIDIN-VILANT 100-62.5-25 MCG/INH IN AEPB: 1.0000 | INHALATION_SPRAY | Freq: Every day | RESPIRATORY_TRACT | Status: DC

## 2021-04-09 MED ORDER — POTASSIUM CHLORIDE 20 MEQ PO PACK
20.0000 meq | PACK | Freq: Once | ORAL | Status: AC
  Administered 2021-04-09: 20 meq via ORAL
  Filled 2021-04-09: qty 1

## 2021-04-09 MED ORDER — POTASSIUM CHLORIDE CRYS ER 20 MEQ PO TBCR
20.0000 meq | EXTENDED_RELEASE_TABLET | Freq: Once | ORAL | Status: DC
  Filled 2021-04-09: qty 1

## 2021-04-09 MED ORDER — FLUTICASONE FUROATE-VILANTEROL 100-25 MCG/INH IN AEPB
1.0000 | INHALATION_SPRAY | Freq: Every day | RESPIRATORY_TRACT | Status: DC
  Administered 2021-04-11 – 2021-04-12 (×2): 1 via RESPIRATORY_TRACT
  Filled 2021-04-09: qty 28

## 2021-04-09 MED ORDER — DOCUSATE SODIUM 100 MG PO CAPS
100.0000 mg | ORAL_CAPSULE | Freq: Two times a day (BID) | ORAL | Status: DC | PRN
  Administered 2021-04-10 – 2021-04-11 (×2): 100 mg via ORAL
  Filled 2021-04-09 (×2): qty 1

## 2021-04-09 MED ORDER — UMECLIDINIUM BROMIDE 62.5 MCG/INH IN AEPB
1.0000 | INHALATION_SPRAY | Freq: Every day | RESPIRATORY_TRACT | Status: DC
  Administered 2021-04-11 – 2021-04-12 (×2): 1 via RESPIRATORY_TRACT
  Filled 2021-04-09: qty 7

## 2021-04-09 MED ORDER — INSULIN GLARGINE 100 UNIT/ML ~~LOC~~ SOLN
10.0000 [IU] | Freq: Every day | SUBCUTANEOUS | Status: DC
  Administered 2021-04-09: 10 [IU] via SUBCUTANEOUS
  Filled 2021-04-09 (×2): qty 0.1

## 2021-04-09 MED ORDER — PANTOPRAZOLE SODIUM 40 MG PO TBEC
40.0000 mg | DELAYED_RELEASE_TABLET | Freq: Every day | ORAL | Status: DC
  Administered 2021-04-10 – 2021-04-12 (×3): 40 mg via ORAL
  Filled 2021-04-09 (×3): qty 1

## 2021-04-09 NOTE — Progress Notes (Addendum)
Advanced Heart Failure Rounding Note   Subjective:    Resting comfortably in bed in no acute distress. Still wheezing a bit.   No drips currently.   Co-ox of 71% noted on 7/15 and volume status reflects -1.5 liters; however, weight increased three pounds and 18 pounds since admission --  unclear of accuracy.   Objective:   Weight Range:  Vital Signs:   Temp:  [97.8 F (36.6 C)-99.2 F (37.3 C)] 98.3 F (36.8 C) (07/16 0645) Pulse Rate:  [93-125] 104 (07/16 0800) BP: (105-141)/(50-76) 114/52 (07/16 0800) SpO2:  [91 %-100 %] 100 % (07/16 0901) Last BM Date:  (PTA)  Weight change: Filed Weights   04/06/21 0300 04/07/21 0500 04/08/21 0500  Weight: 51 kg 57.4 kg 58.7 kg    Intake/Output:   Intake/Output Summary (Last 24 hours) at 04/09/2021 1053 Last data filed at 04/09/2021 0500 Gross per 24 hour  Intake 255.68 ml  Output 1815 ml  Net -1559.32 ml     CVP 10  Physical Exam: General:  In bed.  HEENT: normal Neck: supple. JVP 9-10 . Carotids 2+ bilat; no bruits. No lymphadenopathy or thryomegaly appreciated. Cor: PMI nondisplaced. Regular rate & rhythm. No rubs, gallops or murmurs. Lungs: Expiratory wheezes bilaterally  Abdomen: soft, nontender, nondistended. No hepatosplenomegaly. No bruits or masses. Good bowel sounds. Extremities: no cyanosis, clubbing, rash, edema Neuro: alert & orientedx3, cranial nerves grossly intact. moves all 4 extremities w/o difficulty. Affect flat   Telemetry: NSR -ST 90s with brief SVT   Labs: Basic Metabolic Panel: Recent Labs  Lab 04/06/21 0347 04/06/21 0857 04/06/21 1226 04/06/21 1304 04/07/21 0445 04/07/21 0712 04/08/21 0530 04/09/21 0510  NA 136  --    < > 140 138 135 137 139  K 4.9  --    < > 3.7 5.1 4.9 4.1 3.9  CL 98  --   --  103 104  --  105 103  CO2 27  --   --  27 24  --  24 30  GLUCOSE 496*  --   --  148* 223*  --  126* 95  BUN 56*  --   --  60* 65*  --  59* 63*  CREATININE 2.13* 2.37*  --  2.38* 2.59*  --   2.27* 2.28*  CALCIUM 8.7*  --   --  9.0 8.7*  --  8.4* 8.9  MG  --   --   --  2.4 2.4  --   --   --   PHOS  --   --   --  4.2  --   --   --   --    < > = values in this interval not displayed.     Liver Function Tests: Recent Labs  Lab 04/06/21 1304 04/07/21 0445 04/08/21 0530  AST 53* 27 19  ALT 68* 51* 37  ALKPHOS 42 38 35*  BILITOT 0.8 1.3* 0.6  PROT 5.8* 5.6* 5.2*  ALBUMIN 3.6 3.1* 2.9*    Recent Labs  Lab 04/06/21 1304  LIPASE 29  AMYLASE 57    No results for input(s): AMMONIA in the last 168 hours.  CBC: Recent Labs  Lab 04/06/21 0347 04/06/21 0857 04/06/21 1226 04/06/21 1304 04/07/21 0445 04/07/21 0712 04/08/21 0530 04/09/21 0510  WBC 16.1* 22.4*  --  20.6* 14.8*  --  15.2* 10.9*  NEUTROABS 12.3*  --   --   --   --   --  13.6*  --  HGB 11.9* 10.1*   < > 9.9* 9.7* 10.5* 9.6* 9.7*  HCT 40.2 34.1*   < > 32.5* 31.8* 31.0* 31.7* 31.6*  MCV 107.2* 106.6*  --  103.2* 103.2*  --  104.6* 102.6*  PLT 651* 455*  --  436* 363  --  337 324   < > = values in this interval not displayed.     Cardiac Enzymes: No results for input(s): CKTOTAL, CKMB, CKMBINDEX, TROPONINI in the last 168 hours.  BNP: BNP (last 3 results) Recent Labs    01/19/21 0522 02/19/21 1943 04/06/21 0347  BNP 288.0* 179.0* 918.0*     ProBNP (last 3 results) No results for input(s): PROBNP in the last 8760 hours.    Other results:  Imaging: No results found.   Medications:     Scheduled Medications:  arformoterol  15 mcg Nebulization BID   azithromycin  250 mg Oral Daily   budesonide (PULMICORT) nebulizer solution  0.25 mg Nebulization BID   chlorhexidine  15 mL Mouth Rinse BID   Chlorhexidine Gluconate Cloth  6 each Topical Q0600   furosemide  40 mg Intravenous BID   heparin  5,000 Units Subcutaneous Q8H   insulin aspart  0-15 Units Subcutaneous Q4H   insulin glargine  15 Units Subcutaneous QHS   mouth rinse  15 mL Mouth Rinse q12n4p   nicotine  14 mg Transdermal  Daily   pantoprazole  40 mg Oral Daily   revefenacin  175 mcg Nebulization Daily   sodium chloride flush  3 mL Intravenous Q12H    Infusions:  sodium chloride     sodium chloride Stopped (04/07/21 1834)    PRN Medications: sodium chloride, acetaminophen (TYLENOL) oral liquid 160 mg/5 mL, albuterol, docusate, ondansetron **OR** ondansetron (ZOFRAN) IV, sodium chloride flush   Assessment/Plan:   1. Acute hypoxic/hypercarbic respiratory failure - Likely multifactorial and related to acute on chronic HF, COPD exacerbation and superimposed pneumonia. - - Continue abx for likely PNA/sepsis. Also on steroids.  - Expiratory wheezes noted on auscultation - Treatment for COPD as per primary team   2. Acute on chronic HFrEF, Nonischemic cardiomyopathy - Echo 1/21 EF 40-45% - Echo 04/06/21 EF 25-30% RV mildly reduced. + dyssynchrony - Last co-ox 71% on 7/15 - ? LBBB cardiomyopathy. Has dyssynchrony on echo but LBBB only 148ms. Will d/w EP - Will resume GDMT since blood pressures have stabilized   3. AKI - baseline Scr ~ 1.0. - Creatinine 2.28 today -- stable  - Follow BMET daily    4. Diabetes with hyperglycemia - Glucose of 496 shortly after initial presentation - Per ccm   5. PVD - Continue Plavix  6. COPD with ongoing tobacco use - have discussed cessation numerous times   Length of Stay: [place] AGACNP-BC  04/09/2021, 10:53 AM  Patient seen and examined with the above-signed Advanced Practice Provider and/or Housestaff. I personally reviewed laboratory data, imaging studies and relevant notes. I independently examined the patient and formulated the important aspects of the plan. I have edited the note to reflect any of my changes or salient points. I have personally discussed the plan with the patient and/or family.  Still SOB and wheezing. Remains on iv lasix 40 bid. Weight reported as up 3 pounds. SCr stable at 2.3. (baseline is 1.0). No co-ox or CVP reported  today as central line out   General:  Well appearing. + wheeze HEENT: normal Neck: supple. JVP ear Carotids 2+ bilat; no bruits. No  lymphadenopathy or thryomegaly appreciated. Cor: PMI nondisplaced. Regular rate & rhythm. No rubs, gallops or murmurs. Lungs: tight + wheeze Abdomen: soft, nontender, nondistended. No hepatosplenomegaly. No bruits or masses. Good bowel sounds. Extremities: no cyanosis, clubbing, rash, edema Neuro: alert & orientedx3, cranial nerves grossly intact. moves all 4 extremities w/o difficulty. Affect pleasant  Continues with SOB and wheezing. Suspect mainly COPD and PNA but volume still up. Continue IV diuresis.   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 I d/w EP. Consider CRT as outpatient.   GDMT limited by AKI. Consider SGLT2i soon.   Glori Bickers, MD  12:35 PM

## 2021-04-09 NOTE — Evaluation (Signed)
Occupational Therapy Evaluation Patient Details Name: Hannah French MRN: [id number] DOB: [birth year] Today's Date: 04/09/2021    History of Present Illness Hannah French is a 80 y.o. female presented to the ED 7/13 via EMS for worsening shortness of breath over the last several days which has now led to some confusion.Found to have COPD exacerbation as well as CHF exacerbation. Blood glucose was over 500. Chest xray concerning for PNA.Intubated 7/13. 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  PTA her daughter lives with her and provides assist as needed.  See eval for details.  Barriers are listed below.  Currently she is needing up to [place] for general mobility and lower body ADL.  She is needing increased time and rest breaks for SOB, but could probably go home with continued support from her daughter and [facility] services.  OT is indicated in the acute setting to maximize her functional status.     Follow Up Recommendations  Home health OT;Supervision/Assistance - 24 hour    Equipment Recommendations  3 in 1 bedside commode    Recommendations for Other Services       Precautions / Restrictions Precautions Precautions: Fall Precaution Comments: multiple lines and leads Restrictions Weight Bearing Restrictions: No      Mobility Bed Mobility Overal bed mobility: Needs Assistance Bed Mobility: Supine to Sit     Supine to sit: Min guard       Patient Response: Impulsive;Cooperative  Transfers Overall transfer level: Needs assistance Equipment used: Rolling walker (2 wheeled) Transfers: Sit to/from [facility] Sit to Stand: Min assist Stand pivot transfers: Min assist            Balance Overall balance assessment: Needs assistance Sitting-balance support: No  upper extremity supported;Feet supported Sitting balance-Leahy Scale: Fair     Standing balance support: Bilateral upper extremity supported Standing balance-Leahy Scale: Poor Standing balance comment: walker for support and assist to mainage the lines                           ADL either performed or assessed with clinical judgement   ADL Overall ADL's : Needs assistance/impaired Eating/Feeding: Independent;Sitting   Grooming: Wash/dry hands;Wash/dry face;Set up;Sitting   Upper Body Bathing: Supervision/ safety;Sitting   Lower Body Bathing: Minimal assistance;Sit to/from stand Lower Body Bathing Details (indicate cue type and reason): increased time and effort Upper Body Dressing : Set up;Sitting   Lower Body Dressing: Minimal assistance;Moderate assistance;Sit to/from stand Lower Body Dressing Details (indicate cue type and reason): increased SOB noted             Functional mobility during ADLs: Minimal assistance;Rolling walker       Vision Patient Visual Report: No change from baseline       Perception     Praxis      Pertinent Vitals/Pain Pain Assessment: Faces Faces Pain Scale: Hurts a little bit Pain Location: headache Pain Descriptors / Indicators: Aching Pain Intervention(s): Monitored during session     Hand Dominance Right   Extremity/Trunk Assessment Upper Extremity Assessment Upper Extremity Assessment: Generalized weakness   Lower Extremity Assessment Lower Extremity Assessment: Defer to PT evaluation   Cervical / Trunk Assessment Cervical / Trunk Assessment: Kyphotic   Communication Communication Communication: HOH   Cognition Arousal/Alertness: Awake/alert Behavior During Therapy:  Impulsive Overall Cognitive Status: Within Functional Limits for tasks assessed                                 General Comments: daughter states she is a little forgetful, but at her baseline.   General Comments        Exercises     Shoulder Instructions      Home Living Family/patient expects to be discharged to:: Private residence Living Arrangements: Children Available Help at Discharge: Family Type of Home: House Home Access: Stairs to enter Technical brewer of Steps: 1 Entrance Stairs-Rails: None Home Layout: One level     Bathroom Shower/Tub: Teacher, early years/pre: [place]: Environmental consultant - 2 wheels;Cane - single point;Bedside commode;Shower seat   Additional Comments: Dtr has lived with her since April of this year.  Daughter assists with community mobility, meals and home management.  Assist in/out of bathtub, but bathes and dresses herself.      Prior Functioning/Environment Level of Independence: Needs assistance  Gait / Transfers Assistance Needed: Generally independent.  Daughter states she is a little off balance first thing in the am.  Daughter assists in/out of bathtub ADL's / Homemaking Assistance Needed: Patient assists a little with meals, but daughter provides assist with meals and home management.            OT Problem List: Decreased strength;Decreased activity tolerance;Impaired balance (sitting and/or standing);Decreased safety awareness;Pain      OT Treatment/Interventions: Self-care/ADL training;Therapeutic exercise;Energy conservation;Balance training;Therapeutic activities    OT Goals(Current goals can be found in the care plan section) Acute Rehab OT Goals Patient Stated Goal: head home and recover OT Goal Formulation: With patient Time For Goal Achievement: 04/23/21 Potential to Achieve Goals: Good ADL Goals Pt Will Perform Grooming: with supervision;standing Pt Will Perform Lower Body Bathing: with supervision;sit to/from stand Pt Will Perform Lower Body Dressing: with supervision;sit to/from stand Pt Will Transfer to Toilet: with modified independence;ambulating;regular height toilet Pt Will Perform Toileting - Clothing  Manipulation and hygiene: with modified independence;sit to/from stand  OT Frequency: Min 2X/week   Barriers to D/C:    none noted       Co-evaluation              AM-PAC OT "6 Clicks" Daily Activity     Outcome Measure Help from another person eating meals?: None Help from another person taking care of personal grooming?: None Help from another person toileting, which includes using toliet, bedpan, or urinal?: A Little Help from another person bathing (including washing, rinsing, drying)?: A Little Help from another person to put on and taking off regular upper body clothing?: None Help from another person to put on and taking off regular lower body clothing?: A Little 6 Click Score: 21   End of Session Equipment Utilized During Treatment: Rolling walker;Oxygen Nurse Communication: Mobility status  Activity Tolerance: Patient limited by pain Patient left: in chair;with call bell/phone within reach;with chair alarm set;with family/visitor present  OT Visit Diagnosis: Unsteadiness on feet (R26.81);Muscle weakness (generalized) (M62.81);Pain Pain - Right/Left:  (headache)                Time: 1000-1020 OT Time Calculation (min): 20 min Charges:  OT Evaluation $OT Eval Moderate Complexity: 1 Mod  04/09/2021  Rich, OTR/L  Acute Rehabilitation Services  Office:  [phone]   Metta Clines 04/09/2021, 10:29 AM

## 2021-04-09 NOTE — Progress Notes (Addendum)
Paged Dr. Humphrey Rolls to make him aware central line had been removed in ICU. Orders for COOX and CVP's remain. Dr. Humphrey Rolls stated ok not to do tonight. Carroll Kinds RN

## 2021-04-09 NOTE — Progress Notes (Signed)
PCCM Progress Note  NAME:  Hannah French, MRN:  [id number], DOB:  10/20/1940, LOS: 3 ADMISSION DATE:  04/06/2021, CONSULTATION DATE: 04/06/2021 REFERRING MD: [facility] ED PA, CHIEF COMPLAINT: Monitor dependent respiratory failure, hypotension, acute exacerbation of COPD in the setting of a 80 year old who still smokes  History of Present Illness:  Hannah French  is a 80 year old female who is a lifelong smoker and is followed by pulmonary by Dr. Melvyn Novas and by Dr. Haroldine Laws of the cardiological heart failure team.  She was in her normal state of poor health until approximately 5 days ago when she noticed increasing lower extremity edema which she call for appointment with cardiology.  She had increasing shortness of breath and weakness was seen by her primary care physician and given steroids.  Evaluated in the emergency department found to be increasingly short of breath hypoxic acute distress at which time she was intubated by the emergency department physician.  Due to the fact she has known systolic heart failure followed by heart failure team with a known EF of 35 to 40% and with coronary artery disease status post stent placement with a left bundle branch block.  She cannot utilize beta-blockers due to to a chronically low blood pressure.    On 04/06/2021 she is being transferred to [facility] to the heart unit for further evaluation and treatment.  She will be transferred to the pulmonary critical care team at [facility].  We will asked the heart failure team to sign and and weigh options for her continued care.  Of note she was given vancomycin lactam and Zithromax in the emergency department her procalcitonin was noted to be less than 0.1 she does have leukocytosis but has been on steroids for 5 days.   Pertinent  Medical History   Past Medical History:  Diagnosis Date   Abdominal bruit    Bladder cancer ([place])    CAD (coronary artery disease)    a. nonobstructive  by cath 08/2018.   Carotid bruit    Chronic combined systolic and diastolic CHF (congestive heart failure) (HCC)    CKD (chronic kidney disease), stage II    Congestive heart failure (CHF) (HCC)    Diabetes mellitus    Type II   Dyslipidemia    Heart murmur    Mild pulmonary hypertension (HCC)    Mitral valve prolapse    a. not seen on most recent echoes. Mild MR now.   NICM (nonischemic cardiomyopathy) ([place])    Osteoporosis    Pancreatitis, acute    Pseudogout    PVD (peripheral vascular disease) ([place])    a. 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   Sinus tachycardia    Tobacco abuse      Significant Hospital Events: Including procedures, antibiotic start and stop dates in addition to other pertinent events   Seen at [facility] urgency department 7/1 13 /2022 and required intubation. 04/07/2019 transferred to [facility] for further evaluation and treatment 7/14 Extubated 7/16 continues to improve wean down and stable on 1 to 2 L nasal cannula.  Interim History / Subjective:  Nausea has resolved.  Shortness of breath is improving.  Objective   Blood pressure (!) 114/52, pulse (!) 104, temperature 98.3 F (36.8 C), temperature source Oral, resp. rate 14, height (P) 4\' 11"  (1.499 m), weight 58.7 kg, SpO2 100 %. On nebulizer treatment 90% on RA CVP:  [10 mmHg-12 mmHg] 12 mmHg  Intake/Output Summary (Last 24 hours) at 04/09/2021 1246 Last data filed at 04/09/2021 0500 Gross per 24 hour  Intake --  Output 1665 ml  Net -1665 ml    Filed Weights   04/06/21 0300 04/07/21 0500 04/08/21 0500  Weight: 51 kg 57.4 kg 58.7 kg    Examination: General:  in bed, NAD, appears comfotable HEENT: MM pink/moist, anicteric, atraumatic Neuro: Awake and alert fully oriented.  Generalized weakness.  But no focal deficits CV: Heart sounds are unremarkable.  JVP 4 cm above sternal angle PULM: very slight residual wheeze.   No accessory muscle use. GI: soft, bsx4 active, non tender Extremities: warm/dry, no pretibial edema,  Skin: no rashes or lesions noted   Resolved Hospital Problem list   N/a  Assessment & Plan:  Acute hypercarbic and acute on chronic hypoxemic respiratory failure- secondary to some combination of AECOPD and volume overload. Hx COPD GOLD II Active Smoker HFrEF NICM Hx PVD Acute Kidney Injury on CKD Muscular deconditioning DM2-   Plan:  -Resume home bronchodilator regimen.  Has completed course of steroids.  Complete course of azithromycin. -Continue nicotine replacement to maintain abstinence. -Continue diuresis as per heart failure service we will reintroduce guideline directed heart failure therapy as kidney function allows. -Ready for transfer.  Medication reconciliation performed, hospitalist notified.  Best Practice (right click and "Reselect all SmartList Selections" daily)   Diet/type: PO diet DVT prophylaxis: prophylactic heparin  GI prophylaxis: PPI Lines: Central line and yes and it is still needed Foley:  Yes, and it is no longer needed Code Status:  full code Last date of multidisciplinary goals of care discussion- daughter updated at bedside 7/15  Kipp Brood, MD [facility] ICU Physician [place]  Pager: [phone] Or Epic Secure Chat After hours: [phone].  04/09/2021, 12:52 PM

## 2021-04-10 ENCOUNTER — Inpatient Hospital Stay (HOSPITAL_COMMUNITY): Payer: Medicare Other

## 2021-04-10 DIAGNOSIS — I5023 Acute on chronic systolic (congestive) heart failure: Secondary | ICD-10-CM | POA: Diagnosis not present

## 2021-04-10 DIAGNOSIS — J9601 Acute respiratory failure with hypoxia: Secondary | ICD-10-CM | POA: Diagnosis not present

## 2021-04-10 DIAGNOSIS — J9621 Acute and chronic respiratory failure with hypoxia: Secondary | ICD-10-CM | POA: Diagnosis not present

## 2021-04-10 DIAGNOSIS — J9622 Acute and chronic respiratory failure with hypercapnia: Secondary | ICD-10-CM | POA: Diagnosis not present

## 2021-04-10 LAB — BASIC METABOLIC PANEL
Anion gap: 7 (ref 5–15)
BUN: 59 mg/dL — ABNORMAL HIGH (ref 8–23)
CO2: 34 mmol/L — ABNORMAL HIGH (ref 22–32)
Calcium: 8.8 mg/dL — ABNORMAL LOW (ref 8.9–10.3)
Chloride: 97 mmol/L — ABNORMAL LOW (ref 98–111)
Creatinine, Ser: 2.03 mg/dL — ABNORMAL HIGH (ref 0.44–1.00)
GFR, Estimated: 24 mL/min — ABNORMAL LOW (ref >60)
Glucose, Bld: 196 mg/dL — ABNORMAL HIGH (ref 70–99)
Potassium: 3.9 mmol/L (ref 3.5–5.1)
Sodium: 138 mmol/L (ref 135–145)

## 2021-04-10 LAB — GLUCOSE, CAPILLARY
Glucose-Capillary: 38 mg/dL — CL (ref 70–99)
Glucose-Capillary: 57 mg/dL — ABNORMAL LOW (ref 70–99)
Glucose-Capillary: 105 mg/dL — ABNORMAL HIGH (ref 70–99)
Glucose-Capillary: 241 mg/dL — ABNORMAL HIGH (ref 70–99)
Glucose-Capillary: 303 mg/dL — ABNORMAL HIGH (ref 70–99)
Glucose-Capillary: 397 mg/dL — ABNORMAL HIGH (ref 70–99)
Glucose-Capillary: 446 mg/dL — ABNORMAL HIGH (ref 70–99)
Glucose-Capillary: 204 mg/dL — ABNORMAL HIGH (ref 70–99)
Glucose-Capillary: 202 mg/dL — ABNORMAL HIGH (ref 70–99)
Glucose-Capillary: 228 mg/dL — ABNORMAL HIGH (ref 70–99)

## 2021-04-10 LAB — STREP PNEUMONIAE URINARY ANTIGEN: Strep Pneumo Urinary Antigen: NEGATIVE

## 2021-04-10 MED ORDER — DEXTROSE IN LACTATED RINGERS 5 % IV SOLN: INTRAVENOUS | Status: DC

## 2021-04-10 MED ORDER — ACETAZOLAMIDE 250 MG PO TABS
250.0000 mg | ORAL_TABLET | Freq: Two times a day (BID) | ORAL | Status: DC
  Administered 2021-04-10 – 2021-04-12 (×5): 250 mg via ORAL
  Filled 2021-04-10 (×5): qty 1

## 2021-04-10 MED ORDER — INSULIN ASPART 100 UNIT/ML IJ SOLN
0.0000 [IU] | Freq: Three times a day (TID) | INTRAMUSCULAR | Status: DC
Start: 2021-04-10 — End: 2021-04-12
  Administered 2021-04-10: 3 [IU] via SUBCUTANEOUS
  Administered 2021-04-10: 9 [IU] via SUBCUTANEOUS
  Administered 2021-04-11 (×3): 3 [IU] via SUBCUTANEOUS
  Administered 2021-04-12: 5 [IU] via SUBCUTANEOUS
  Administered 2021-04-12: 2 [IU] via SUBCUTANEOUS

## 2021-04-10 MED ORDER — MELATONIN 3 MG PO TABS: 3.0000 mg | ORAL_TABLET | Freq: Once | ORAL | Status: DC

## 2021-04-10 MED ORDER — INSULIN ASPART 100 UNIT/ML IJ SOLN: 0.0000 [IU] | Freq: Three times a day (TID) | INTRAMUSCULAR | Status: DC

## 2021-04-10 MED ORDER — EMPAGLIFLOZIN 10 MG PO TABS
10.0000 mg | ORAL_TABLET | Freq: Every day | ORAL | Status: DC
  Administered 2021-04-10 – 2021-04-12 (×2): 10 mg via ORAL
  Filled 2021-04-10 (×3): qty 1

## 2021-04-10 NOTE — Progress Notes (Signed)
Dr. Haroldine Laws made aware pt no longer has central line. He stated he was aware. PICC line not needed at this point. Carroll Kinds RN

## 2021-04-10 NOTE — Progress Notes (Signed)
[facility] Progress Note Patient Name: Myriah Boggus DOB: November 19, 1940 MRN: [id number]   Date of Service  04/10/2021  HPI/Events of Note  Patient with iatrogenic hypoglycemia.  eICU Interventions  D 5 % LR  gtt at 50 ml / hour x 10 hours then discontinue, CBG hourly x 6 hours.        Kerry Kass Jamarian Jacinto 04/10/2021, 5:06 AM

## 2021-04-10 NOTE — TOC Initial Note (Signed)
Transition of Care [facility]) - Initial/Assessment Note    Patient Details  Name: Hannah French MRN: [id number] Date of Birth: 1941/02/11  Transition of [facility]) CM/SW Contact:    Ina Homes, Lexa Phone Number: 04/10/2021, 11:46 AM  Clinical Narrative:                  SW met with pt at bedside. Pt very HOH. Pt confirmed demographics. Pt lives alone but daughter Earlene Plater [phone]) stays when pt is sick. Pt refusing SNF, but agreeable to [facility]. Pt's daughter will transport at d/c.   Expected Discharge Plan: [place] Barriers to Discharge: Continued Medical Work up   Patient Goals and CMS Choice Patient states their goals for this hospitalization and ongoing recovery are:: going home with daughter support      Expected Discharge Plan and Services Expected Discharge Plan: [place]       Living arrangements for the past 2 months: Single Family Home                                      Prior Living Arrangements/Services Living arrangements for the past 2 months: Single Family Home Lives with:: Self Patient language and need for interpreter reviewed:: Yes Do you feel safe going back to the place where you live?: Yes        Care giver support system in place?: Yes (comment) Current home services: DME Criminal Activity/Legal Involvement Pertinent to Current Situation/Hospitalization: No - Comment as needed  Activities of Daily Living      Permission Sought/Granted Permission sought to share information with : Case Manager, Customer service manager Permission granted to share information with : Yes, Verbal Permission Granted              Emotional Assessment Appearance:: Appears stated age Attitude/Demeanor/Rapport: Engaged Affect (typically observed): Accepting, Appropriate Orientation: : Oriented to Self, Oriented to Place, Oriented to  Time, Oriented to Situation      Admission diagnosis:  Acute  respiratory failure (HCC) [J96.00] Hypoxemia [R09.02] Hyperglycemia [R73.9] History of heart failure [Z86.79] AKI (acute kidney injury) ([place]) [N17.9] Chronic obstructive pulmonary disease with acute exacerbation (HCC) [J44.1] Severe sepsis ([place]) [A41.9, R65.20] Acute on chronic respiratory failure with hypoxia and hypercapnia ([place]) [J96.21, J96.22] Respiratory failure ([place]) [J96.90] Patient Active Problem List   Diagnosis Date Noted   Acute respiratory failure ([place]) 04/06/2021   Respiratory failure ([place]) 04/06/2021   Syncope 02/19/2021   Acute GI bleeding 02/19/2021   Acute on chronic blood loss anemia 02/19/2021   Acute respiratory failure with hypoxia ([place]) 01/17/2021   Elevated MCV 01/17/2021   Leukocytosis 01/17/2021   CKD (chronic kidney disease), stage III ([place]) 01/17/2021   Hyperglycemia due to diabetes mellitus ([place]) 01/17/2021   Skin abscess 01/15/2020   Depression, recurrent ([place]) 08/08/2019   Cigarette smoker 06/19/2019   Vitamin D deficiency 01/29/2019   Dizziness 12/03/2018   Chronic combined systolic and diastolic CHF (congestive heart failure) ([place]) 12/03/2018   Coronary artery disease involving native coronary artery of native heart with angina pectoris ([place]) 10/04/2018   Hyperlipidemia 10/04/2018   Pain in right hand 10/02/2018   Myalgia due to statin 03/29/2018   Stage 3 severe COPD by GOLD classification ([place]) 10/26/2017   Tobacco abuse 03/07/2016   Type 2 diabetes mellitus ([place]) 08/26/2015   Osteoporosis 09/02/2014   Carotid stenosis 07/22/2012  PVD 07/02/2009   BLADDER CANCER 06/05/2009   Hyperlipidemia associated with type 2 diabetes mellitus ([place]) 06/05/2009   PSEUDOGOUT 06/05/2009   MITRAL VALVE PROLAPSE 06/05/2009   PCP:  Dettinger, Fransisca Kaufmann, MD Pharmacy:   [facility] [place] [address] Phone: [phone] Fax: [phone]  [place] [address], [place]  [address] [place] [address] Phone: [phone] Fax: [phone]     Social Determinants of Health (SDOH) Interventions    Readmission Risk Interventions Readmission Risk Prevention Plan 01/18/2021  Transportation Screening Complete  HRI or Home Care Consult Complete  Social Work Consult for [place] Planning/Counseling Complete  Palliative Care Screening Not Applicable  Medication Review Press photographer) Complete  Some recent data might be hidden

## 2021-04-10 NOTE — Progress Notes (Signed)
Hypoglycemic Event  CBG: 35  Treatment: Oral intake  Symptoms: Sweaty   Follow-up CBG: Time  0441 CBG Result:105  Possible Reasons for Event: insuline administered at midnight  Comments/MD notified: [place]

## 2021-04-10 NOTE — Progress Notes (Signed)
Advanced Heart Failure Rounding Note   Subjective:    Feels breathing is better. Remains on IV lasix with only modest urine output. Weight down 9 pounds but different scale.   Feels breathing is better. Renal function improving but developing contraction alkalosis.   Objective:   Weight Range:  Vital Signs:   Temp:  [97.5 F (36.4 C)-98.5 F (36.9 C)] 97.8 F (36.6 C) (07/17 1128) Pulse Rate:  [95-124] 104 (07/17 1128) Resp:  [18-20] 19 (07/17 0509) BP: (105-133)/(51-100) 133/72 (07/17 1128) SpO2:  [86 %-100 %] 99 % (07/17 0509) Weight:  [54.5 kg] 54.5 kg (07/17 0509) Last BM Date: 04/04/21  Weight change: Filed Weights   04/07/21 0500 04/08/21 0500 04/10/21 0509  Weight: 57.4 kg 58.7 kg 54.5 kg    Intake/Output:   Intake/Output Summary (Last 24 hours) at 04/10/2021 1345 Last data filed at 04/10/2021 0908 Gross per 24 hour  Intake 480 ml  Output --  Net 480 ml      Physical Exam: General:  Lying in bed No resp difficulty HEENT: normal Neck: supple. JVP to jaw + prominent v waves Carotids 2+ bilat; no bruits. No lymphadenopathy or thryomegaly appreciated. Cor: PMI nondisplaced. Regular rate & rhythm. No rubs, gallops or murmurs. Lungs: markedly decreased  Abdomen: soft, nontender, mildly distended. No hepatosplenomegaly. No bruits or masses. Good bowel sounds. Extremities: no cyanosis, clubbing, rash, edema Neuro: alert & orientedx3, cranial nerves grossly intact. moves all 4 extremities w/o difficulty. Affect pleasant   Telemetry: NSR.ST 90-105 Personally reviewed   Labs: Basic Metabolic Panel: Recent Labs  Lab 04/06/21 1304 04/07/21 0445 04/07/21 0712 04/08/21 0530 04/09/21 0510 04/10/21 0110  NA 140 138 135 137 139 138  K 3.7 5.1 4.9 4.1 3.9 3.9  CL 103 104  --  105 103 97*  CO2 27 24  --  24 30 34*  GLUCOSE 148* 223*  --  126* 95 196*  BUN 60* 65*  --  59* 63* 59*  CREATININE 2.38* 2.59*  --  2.27* 2.28* 2.03*  CALCIUM 9.0 8.7*  --  8.4*  8.9 8.8*  MG 2.4 2.4  --   --   --   --   PHOS 4.2  --   --   --   --   --      Liver Function Tests: Recent Labs  Lab 04/06/21 1304 04/07/21 0445 04/08/21 0530  AST 53* 27 19  ALT 68* 51* 37  ALKPHOS 42 38 35*  BILITOT 0.8 1.3* 0.6  PROT 5.8* 5.6* 5.2*  ALBUMIN 3.6 3.1* 2.9*    Recent Labs  Lab 04/06/21 1304  LIPASE 29  AMYLASE 57    No results for input(s): AMMONIA in the last 168 hours.  CBC: Recent Labs  Lab 04/06/21 0347 04/06/21 0857 04/06/21 1226 04/06/21 1304 04/07/21 0445 04/07/21 0712 04/08/21 0530 04/09/21 0510  WBC 16.1* 22.4*  --  20.6* 14.8*  --  15.2* 10.9*  NEUTROABS 12.3*  --   --   --   --   --  13.6*  --   HGB 11.9* 10.1*   < > 9.9* 9.7* 10.5* 9.6* 9.7*  HCT 40.2 34.1*   < > 32.5* 31.8* 31.0* 31.7* 31.6*  MCV 107.2* 106.6*  --  103.2* 103.2*  --  104.6* 102.6*  PLT 651* 455*  --  436* 363  --  337 324   < > = values in this interval not displayed.     Cardiac Enzymes:  No results for input(s): CKTOTAL, CKMB, CKMBINDEX, TROPONINI in the last 168 hours.  BNP: BNP (last 3 results) Recent Labs    01/19/21 0522 02/19/21 1943 04/06/21 0347  BNP 288.0* 179.0* 918.0*     ProBNP (last 3 results) No results for input(s): PROBNP in the last 8760 hours.    Other results:  Imaging: No results found.   Medications:     Scheduled Medications:  fluticasone furoate-vilanterol  1 puff Inhalation Daily   furosemide  40 mg Intravenous BID   heparin  5,000 Units Subcutaneous Q8H   insulin aspart  0-9 Units Subcutaneous TID WC   melatonin  3 mg Oral Once   nicotine  14 mg Transdermal Daily   pantoprazole  40 mg Oral Daily   sodium chloride flush  3 mL Intravenous Q12H   umeclidinium bromide  1 puff Inhalation Daily    Infusions:  sodium chloride      PRN Medications: sodium chloride, acetaminophen (TYLENOL) oral liquid 160 mg/5 mL, albuterol, docusate sodium, ondansetron **OR** ondansetron (ZOFRAN) IV, sodium chloride  flush   Assessment/Plan:   1. Acute hypoxic/hypercarbic respiratory failure - Likely multifactorial and related to acute on chronic HF, COPD exacerbation and superimposed pneumonia. - - Continue abx for likely PNA/sepsis. Also on steroids.  - Treatment for COPD as per primary team   2. Acute on chronic HFrEF, Nonischemic cardiomyopathy - Echo 1/21 EF 40-45% - Echo 04/06/21 EF 25-30% RV mildly reduced. + dyssynchrony - Last co-ox 71% on 7/15 - Remains volume overloaded on exam. But developing contraction alkalosis. Will cotninue IV lasix. Start diamox. May need RHC to further evaluate.  -  Possibly has LBBB cardiomyopathy. I d/w EP. Consider CRT as outpatient.   3. AKI - baseline Scr ~ 1.0. - Creatinine 2.3 -> 2.0 today - Follow BMET daily    4. Diabetes with hyperglycemia - Per primary. hgbA1c = 8.1 - Add Jardiance   5. PVD - Continue Plavix  6. COPD with ongoing tobacco use - have discussed cessation numerous times   Length of Stay: 4   Hannah Sustaita MD 04/10/2021, 1:45 PM

## 2021-04-10 NOTE — Progress Notes (Signed)
[facility] Progress Note Patient Name: Emmani Lesueur DOB: [birth year] MRN: [id number]   Date of Service  04/10/2021  HPI/Events of Note  Patient asking for a sleep aid.  eICU Interventions  Melatonin 3 mg po x 1 ordered.        Kerry Kass Juron Vorhees 04/10/2021, 1:43 AM

## 2021-04-10 NOTE — Progress Notes (Signed)
PROGRESS NOTE    Hannah French  GYK:[id number] DOB: Jan 27, 1941 DOA: 04/06/2021 PCP: Dettinger, Fransisca Kaufmann, MD   Chief Complaint  Patient presents with   Shortness of Breath        Brief Narrative:  80 yo F with hx systolic and diastolic HF, tobacco abuse, CKD, T2DM, CAD, COPD (not on home O2) and multiple other medical problems presenting on 7/13 with  worsening shortness of breath and confusion.  She'd been started on steroids for suspected COPD exacerbation by her PCP prior to admission and told to increase her diuretics for concern for volume overload (6-8 lb weight gain prior to admission per H&P).  She required intubation and care was transferred to [facility].  Cardiology was c/s as well for her heart failure exacerbation.  She was extubated on 7/14.  Transferred to [facility] service on 7/17.  Cardiology is following and continuing diuresis at this time.   Assessment & Plan:   Active Problems:   Acute respiratory failure (HCC)   Respiratory failure (HCC)  Acute Hypoxic Respiratory Failure 2/2 COPD and HF exacerbation Treatment as noted below Wean for O2 sat >79%  Acute Systolic and Diastolic HF Exacerbation Echo 7/13 with EF 25-30%, severe septal hypokinesis and basal to mid inferior akinesis, septal lateral dyssynchrony c/w LBBB, left ventricular diastolic parameters c/w grade 2 diastolic dysfunction (see report) Continue diuresis per cardiology - continue lasix, diamox, considering RHC - consider CRT outpatient Strict I/O, daily weights  Acute COPD Exacerbation S/p abx with azithromycin x5 days (received 2 days of ceftriaxone, cefepime x1 day and aztreonam x1 day) CXR 7/14 with slight improvement in mildly prominent interstitial markings Methylpred 7/13-7/15 Continue nebs and inhalers  T2DM  Hypoglycemia Hypoglycemia this AM, suspect related to q4 SSI, stacking? Stop basal lantus, SSI ac Follow closely  Continue jardiance  AKI on CKD stage IIIa Baseline creatinine in  may appears to have been 1.1-1.5 Creatinine is 2 today, improving Follow with diuresis Follow renal US UA at presentation appears bland  Hx PVD  Tobacco Abuse  GERD PPI  DVT prophylaxis: heparin Code Status: full  Family Communication: (none  Disposition:   Status is: Inpatient  Remains inpatient appropriate because:Inpatient level of care appropriate due to severity of illness  Dispo: The patient is from: Home              Anticipated d/c is to: Home              Patient currently is not medically stable to d/c.   Difficult to place patient No       Consultants:  PCCM cardiology  Procedures:  Echo IMPRESSIONS     1. Left ventricular ejection fraction, by estimation, is 25 to 30%. The  left ventricle has severely decreased function. The left ventricle  demonstrates regional wall motion abnormalities with severe septal  hypokinesis and basal to mid inferior akinesis.   There is septal-lateral dyssynchrony consistent with LBBB. Left  ventricular diastolic parameters are consistent with Grade II diastolic  dysfunction (pseudonormalization).   2. Right ventricular systolic function is mildly reduced. The right  ventricular size is normal. Tricuspid regurgitation signal is inadequate  for assessing PA pressure.   3. Left atrial size was mildly dilated.   4. The mitral valve is normal in structure. Trivial mitral valve  regurgitation. No evidence of mitral stenosis.   5. The aortic valve is tricuspid. Aortic valve regurgitation is not  visualized. Mild aortic valve sclerosis is present, with no evidence  of  aortic valve stenosis.   6. The inferior vena cava is normal in size with <50% respiratory  variability, suggesting right atrial pressure of 8 mmHg.   Antimicrobials:  Anti-infectives (From admission, onward)    Start     Dose/Rate Route Frequency Ordered Stop   04/09/21 1000  azithromycin (ZITHROMAX) tablet 250 mg        250 mg Oral Daily 04/08/21 1443  04/10/21 0941   04/08/21 1515  azithromycin (ZITHROMAX) tablet 250 mg  Status:  Discontinued        250 mg Oral Daily 04/08/21 1417 04/08/21 1443   04/07/21 1400  cefTRIAXone (ROCEPHIN) 1 g in sodium chloride 0.9 % 100 mL IVPB  Status:  Discontinued        1 g 200 mL/hr over 30 Minutes Intravenous Every 24 hours 04/07/21 0801 04/08/21 1417   04/07/21 0815  vancomycin (VANCOREADY) IVPB 750 mg/150 mL  Status:  Discontinued        750 mg 150 mL/hr over 60 Minutes Intravenous  Once 04/07/21 0721 04/07/21 0806   04/06/21 1430  ceFEPIme (MAXIPIME) 2 g in sodium chloride 0.9 % 100 mL IVPB  Status:  Discontinued        2 g 200 mL/hr over 30 Minutes Intravenous Every 24 hours 04/06/21 1323 04/07/21 0806   04/06/21 1326  vancomycin variable dose per unstable renal function (pharmacist dosing)  Status:  Discontinued         Does not apply See admin instructions 04/06/21 1326 04/07/21 0806   04/06/21 0900  azithromycin (ZITHROMAX) 500 mg in sodium chloride 0.9 % 250 mL IVPB  Status:  Discontinued        500 mg 250 mL/hr over 60 Minutes Intravenous Every 24 hours 04/06/21 0826 04/08/21 1417   04/06/21 0415  vancomycin (VANCOCIN) IVPB 1000 mg/200 mL premix        1,000 mg 200 mL/hr over 60 Minutes Intravenous  Once 04/06/21 0405 04/06/21 0515   04/06/21 0415  aztreonam (AZACTAM) 2 g in sodium chloride 0.9 % 100 mL IVPB        2 g 200 mL/hr over 30 Minutes Intravenous  Once 04/06/21 0405 04/06/21 0445          Subjective: Denies any new complaints Not on oxygen at home  Objective: Vitals:   04/10/21 0010 04/10/21 0509 04/10/21 0741 04/10/21 1128  BP: 116/60 (!) 105/55 (!) 120/51 133/72  Pulse: (!) 101 95 (!) 104 (!) 104  Resp: 20 19    Temp: 98.1 F (36.7 C) 98.1 F (36.7 C) (!) 97.5 F (36.4 C) 97.8 F (36.6 C)  TempSrc: Oral Oral Oral Oral  SpO2: 96% 99%    Weight:  54.5 kg    Height:        Intake/Output Summary (Last 24 hours) at 04/10/2021 1430 Last data filed at 04/10/2021  0908 Gross per 24 hour  Intake 480 ml  Output --  Net 480 ml   Filed Weights   04/07/21 0500 04/08/21 0500 04/10/21 0509  Weight: 57.4 kg 58.7 kg 54.5 kg    Examination:  General: No acute distress. Cardiovascular: Heart sounds show Malasha Kleppe regular rate, and rhythm. Lungs: Clear to auscultation bilaterally Abdomen: Soft, nontender, nondistended  Neurological: Alert and oriented 3 - hard of hearing. Moves all extremities 4 . Cranial nerves II through XII grossly intact. Skin: Warm and dry. No rashes or lesions. Extremities: trace edema   Data Reviewed: I have personally reviewed following labs and imaging  studies  CBC: Recent Labs  Lab 04/06/21 0347 04/06/21 0857 04/06/21 1226 04/06/21 1304 04/07/21 0445 04/07/21 0712 04/08/21 0530 04/09/21 0510  WBC 16.1* 22.4*  --  20.6* 14.8*  --  15.2* 10.9*  NEUTROABS 12.3*  --   --   --   --   --  13.6*  --   HGB 11.9* 10.1*   < > 9.9* 9.7* 10.5* 9.6* 9.7*  HCT 40.2 34.1*   < > 32.5* 31.8* 31.0* 31.7* 31.6*  MCV 107.2* 106.6*  --  103.2* 103.2*  --  104.6* 102.6*  PLT 651* 455*  --  436* 363  --  337 324   < > = values in this interval not displayed.    Basic Metabolic Panel: Recent Labs  Lab 04/06/21 1304 04/07/21 0445 04/07/21 0712 04/08/21 0530 04/09/21 0510 04/10/21 0110  NA 140 138 135 137 139 138  K 3.7 5.1 4.9 4.1 3.9 3.9  CL 103 104  --  105 103 97*  CO2 27 24  --  24 30 34*  GLUCOSE 148* 223*  --  126* 95 196*  BUN 60* 65*  --  59* 63* 59*  CREATININE 2.38* 2.59*  --  2.27* 2.28* 2.03*  CALCIUM 9.0 8.7*  --  8.4* 8.9 8.8*  MG 2.4 2.4  --   --   --   --   PHOS 4.2  --   --   --   --   --     GFR: Estimated Creatinine Clearance: 16.6 mL/min (Celina Shiley) (by C-G formula based on SCr of 2.03 mg/dL (H)).  Liver Function Tests: Recent Labs  Lab 04/06/21 1304 04/07/21 0445 04/08/21 0530  AST 53* 27 19  ALT 68* 51* 37  ALKPHOS 42 38 35*  BILITOT 0.8 1.3* 0.6  PROT 5.8* 5.6* 5.2*  ALBUMIN 3.6 3.1* 2.9*     CBG: Recent Labs  Lab 04/10/21 0623 04/10/21 0737 04/10/21 0845 04/10/21 0946 04/10/21 1126  GLUCAP 241* 303* 397* 446* 204*     Recent Results (from the past 240 hour(s))  Resp Panel by RT-PCR (Flu Lorrena Goranson&B, Covid) Nasopharyngeal Swab     Status: None   Collection Time: 04/06/21  3:14 AM   Specimen: Nasopharyngeal Swab; Nasopharyngeal(NP) swabs in vial transport medium  Result Value Ref Range Status   SARS Coronavirus 2 by RT PCR NEGATIVE NEGATIVE Final    Comment: (NOTE) SARS-CoV-2 target nucleic acids are NOT DETECTED.  The SARS-CoV-2 RNA is generally detectable in upper respiratory specimens during the acute phase of infection. The lowest concentration of SARS-CoV-2 viral copies this assay can detect is 138 copies/mL. Achol Azpeitia negative result does not preclude SARS-Cov-2 infection and should not be used as the sole basis for treatment or other patient management decisions. Ashby Leflore negative result may occur with  improper specimen collection/handling, submission of specimen other than nasopharyngeal swab, presence of viral mutation(s) within the areas targeted by this assay, and inadequate number of viral copies(<138 copies/mL). Kavontae Pritchard negative result must be combined with clinical observations, patient history, and epidemiological information. The expected result is Negative.  Fact Sheet for Patients:  EntrepreneurPulse.com.au  Fact Sheet for Healthcare Providers:  IncredibleEmployment.be  This test is no t yet approved or cleared by the [place] FDA and  has been authorized for detection and/or diagnosis of SARS-CoV-2 by FDA under an Emergency Use Authorization (EUA). This EUA will remain  in effect (meaning this test can be used) for the duration of the COVID-19 declaration under  Section 564(b)(1) of the Act, 21 U.S.C.section 360bbb-3(b)(1), unless the authorization is terminated  or revoked sooner.       Influenza Blase Beckner by PCR NEGATIVE  NEGATIVE Final   Influenza B by PCR NEGATIVE NEGATIVE Final    Comment: (NOTE) The Xpert Xpress SARS-CoV-2/FLU/RSV plus assay is intended as an aid in the diagnosis of influenza from Nasopharyngeal swab specimens and should not be used as Kade Rickels sole basis for treatment. Nasal washings and aspirates are unacceptable for Xpert Xpress SARS-CoV-2/FLU/RSV testing.  Fact Sheet for Patients: EntrepreneurPulse.com.au  Fact Sheet for Healthcare Providers: IncredibleEmployment.be  This test is not yet approved or cleared by the [place] FDA and has been authorized for detection and/or diagnosis of SARS-CoV-2 by FDA under an Emergency Use Authorization (EUA). This EUA will remain in effect (meaning this test can be used) for the duration of the COVID-19 declaration under Section 564(b)(1) of the Act, 21 U.S.C. section 360bbb-3(b)(1), unless the authorization is terminated or revoked.  Performed at [facility], [address] [place], [place] [address]   Blood culture (routine x 2)     Status: None (Preliminary result)   Collection Time: 04/06/21  3:48 AM   Specimen: BLOOD RIGHT FOREARM  Result Value Ref Range Status   Specimen Description BLOOD RIGHT FOREARM  Final   Special Requests   Final    BOTTLES DRAWN AEROBIC AND ANAEROBIC Blood Culture adequate volume   Culture   Final    NO GROWTH 4 DAYS Performed at [facility], [address] [place], [place] [address]    Report Status PENDING  Incomplete  Blood culture (routine x 2)     Status: None (Preliminary result)   Collection Time: 04/06/21  3:50 AM   Specimen: BLOOD LEFT FOREARM  Result Value Ref Range Status   Specimen Description BLOOD LEFT FOREARM  Final   Special Requests   Final    BOTTLES DRAWN AEROBIC AND ANAEROBIC Blood Culture adequate volume   Culture   Final    NO GROWTH 4 DAYS Performed at [facility], [address] [place], [place] [address]    Report Status PENDING   Incomplete  MRSA Next Gen by PCR, Nasal     Status: None   Collection Time: 04/06/21 11:37 AM   Specimen: Nasal Swab  Result Value Ref Range Status   MRSA by PCR Next Gen NOT DETECTED NOT DETECTED Final    Comment: (NOTE) The GeneXpert MRSA Assay (FDA approved for NASAL specimens only), is one component of Axell Trigueros comprehensive MRSA colonization surveillance program. It is not intended to diagnose MRSA infection nor to guide or monitor treatment for MRSA infections. Test performance is not FDA approved in patients less than 40 years old. Performed at [facility], [place] [address] [place], [place] [address]   Urine culture     Status: None   Collection Time: 04/06/21  1:04 PM   Specimen: Urine, Random  Result Value Ref Range Status   Specimen Description URINE, RANDOM  Final   Special Requests NONE  Final   Culture   Final    NO GROWTH Performed at [facility], [place] [address] [place], [place] [address]    Report Status 04/07/2021 FINAL  Final  Culture, Respiratory w Gram Stain     Status: None   Collection Time: 04/06/21  3:59 PM   Specimen: Tracheal Aspirate; Respiratory  Result Value Ref Range Status   Specimen Description TRACHEAL ASPIRATE  Final  Special Requests NONE  Final   Gram Stain NO WBC SEEN NO ORGANISMS SEEN   Final   Culture   Final    RARE Normal respiratory flora-no Staph aureus or Pseudomonas seen Performed at [facility], [address] [place], [place] [address]    Report Status 04/09/2021 FINAL  Final         Radiology Studies: No results found.      Scheduled Meds:  acetaZOLAMIDE  250 mg Oral BID   empagliflozin  10 mg Oral Daily   fluticasone furoate-vilanterol  1 puff Inhalation Daily   furosemide  40 mg Intravenous BID   heparin  5,000 Units Subcutaneous Q8H   insulin aspart  0-9 Units Subcutaneous TID WC   melatonin  3 mg Oral Once   nicotine  14 mg Transdermal Daily   pantoprazole  40 mg Oral Daily   sodium  chloride flush  3 mL Intravenous Q12H   umeclidinium bromide  1 puff Inhalation Daily   Continuous Infusions:  sodium chloride       LOS: 4 days    Time spent: over 30 min    Fayrene Helper, MD [facility]   To contact the attending provider between 7A-7P or the covering provider during after hours 7P-7A, please log into the web site [email or web address] and access using universal [place] password for that web site. If you do not have the password, please call the hospital operator.  04/10/2021, 2:30 PM

## 2021-04-11 DIAGNOSIS — I5023 Acute on chronic systolic (congestive) heart failure: Secondary | ICD-10-CM | POA: Diagnosis not present

## 2021-04-11 DIAGNOSIS — J9621 Acute and chronic respiratory failure with hypoxia: Secondary | ICD-10-CM | POA: Diagnosis not present

## 2021-04-11 DIAGNOSIS — J9622 Acute and chronic respiratory failure with hypercapnia: Secondary | ICD-10-CM | POA: Diagnosis not present

## 2021-04-11 DIAGNOSIS — J9601 Acute respiratory failure with hypoxia: Secondary | ICD-10-CM | POA: Diagnosis not present

## 2021-04-11 LAB — CBC WITH DIFFERENTIAL/PLATELET
Abs Immature Granulocytes: 0.26 10*3/uL — ABNORMAL HIGH (ref 0.00–0.07)
Basophils Absolute: 0 10*3/uL (ref 0.0–0.1)
Basophils Relative: 0 %
Eosinophils Absolute: 0.4 10*3/uL (ref 0.0–0.5)
Eosinophils Relative: 3 %
HCT: 33.8 % — ABNORMAL LOW (ref 36.0–46.0)
Hemoglobin: 10.4 g/dL — ABNORMAL LOW (ref 12.0–15.0)
Immature Granulocytes: 2 %
Lymphocytes Relative: 15 %
Lymphs Abs: 1.8 10*3/uL (ref 0.7–4.0)
MCH: 31.4 pg (ref 26.0–34.0)
MCHC: 30.8 g/dL (ref 30.0–36.0)
MCV: 102.1 fL — ABNORMAL HIGH (ref 80.0–100.0)
Monocytes Absolute: 1.1 10*3/uL — ABNORMAL HIGH (ref 0.1–1.0)
Monocytes Relative: 9 %
Neutro Abs: 8.2 10*3/uL — ABNORMAL HIGH (ref 1.7–7.7)
Neutrophils Relative %: 71 %
Platelets: 289 10*3/uL (ref 150–400)
RBC: 3.31 MIL/uL — ABNORMAL LOW (ref 3.87–5.11)
RDW: 23.9 % — ABNORMAL HIGH (ref 11.5–15.5)
WBC: 11.8 10*3/uL — ABNORMAL HIGH (ref 4.0–10.5)
nRBC: 0 % (ref 0.0–0.2)

## 2021-04-11 LAB — COMPREHENSIVE METABOLIC PANEL
ALT: 26 U/L (ref 0–44)
AST: 15 U/L (ref 15–41)
Albumin: 3.2 g/dL — ABNORMAL LOW (ref 3.5–5.0)
Alkaline Phosphatase: 35 U/L — ABNORMAL LOW (ref 38–126)
Anion gap: 10 (ref 5–15)
BUN: 55 mg/dL — ABNORMAL HIGH (ref 8–23)
CO2: 35 mmol/L — ABNORMAL HIGH (ref 22–32)
Calcium: 8.9 mg/dL (ref 8.9–10.3)
Chloride: 94 mmol/L — ABNORMAL LOW (ref 98–111)
Creatinine, Ser: 1.87 mg/dL — ABNORMAL HIGH (ref 0.44–1.00)
GFR, Estimated: 27 mL/min — ABNORMAL LOW (ref >60)
Glucose, Bld: 332 mg/dL — ABNORMAL HIGH (ref 70–99)
Potassium: 3.4 mmol/L — ABNORMAL LOW (ref 3.5–5.1)
Sodium: 139 mmol/L (ref 135–145)
Total Bilirubin: 1 mg/dL (ref 0.3–1.2)
Total Protein: 5.7 g/dL — ABNORMAL LOW (ref 6.5–8.1)

## 2021-04-11 LAB — GLUCOSE, CAPILLARY
Glucose-Capillary: 246 mg/dL — ABNORMAL HIGH (ref 70–99)
Glucose-Capillary: 226 mg/dL — ABNORMAL HIGH (ref 70–99)
Glucose-Capillary: 214 mg/dL — ABNORMAL HIGH (ref 70–99)
Glucose-Capillary: 232 mg/dL — ABNORMAL HIGH (ref 70–99)

## 2021-04-11 LAB — MAGNESIUM: Magnesium: 2 mg/dL (ref 1.7–2.4)

## 2021-04-11 LAB — CULTURE, BLOOD (ROUTINE X 2)
Culture: NO GROWTH
Culture: NO GROWTH
Special Requests: ADEQUATE
Special Requests: ADEQUATE

## 2021-04-11 LAB — PHOSPHORUS: Phosphorus: 4.4 mg/dL (ref 2.5–4.6)

## 2021-04-11 MED ORDER — POTASSIUM CHLORIDE 20 MEQ PO PACK
40.0000 meq | PACK | Freq: Once | ORAL | Status: AC
  Administered 2021-04-11: 40 meq via ORAL
  Filled 2021-04-11: qty 2

## 2021-04-11 MED ORDER — POTASSIUM CHLORIDE CRYS ER 20 MEQ PO TBCR
40.0000 meq | EXTENDED_RELEASE_TABLET | Freq: Once | ORAL | Status: DC
  Filled 2021-04-11: qty 2

## 2021-04-11 MED ORDER — BENZONATATE 100 MG PO CAPS: 100.0000 mg | ORAL_CAPSULE | Freq: Two times a day (BID) | ORAL | Status: DC | PRN

## 2021-04-11 MED ORDER — DM-GUAIFENESIN ER 30-600 MG PO TB12
1.0000 | ORAL_TABLET | Freq: Two times a day (BID) | ORAL | Status: DC
  Administered 2021-04-11 – 2021-04-12 (×3): 1 via ORAL
  Filled 2021-04-11 (×4): qty 1

## 2021-04-11 MED ORDER — NITROGLYCERIN 0.2 MG/HR TD PT24
0.2000 mg | MEDICATED_PATCH | Freq: Every day | TRANSDERMAL | Status: DC
  Administered 2021-04-11: 0.2 mg via TRANSDERMAL
  Filled 2021-04-11 (×2): qty 1

## 2021-04-11 MED ORDER — INSULIN DETEMIR 100 UNIT/ML ~~LOC~~ SOLN
5.0000 [IU] | Freq: Every day | SUBCUTANEOUS | Status: DC
  Administered 2021-04-11 – 2021-04-12 (×2): 5 [IU] via SUBCUTANEOUS
  Filled 2021-04-11 (×2): qty 0.05

## 2021-04-11 MED ORDER — INSULIN ASPART 100 UNIT/ML IJ SOLN
2.0000 [IU] | Freq: Three times a day (TID) | INTRAMUSCULAR | Status: DC
  Administered 2021-04-11 – 2021-04-12 (×4): 2 [IU] via SUBCUTANEOUS

## 2021-04-11 NOTE — Progress Notes (Addendum)
Advanced Heart Failure Rounding Note   Subjective:    Diamox added yesterday. UOP not recorded. Wt down 7 lb at 113 lb (reports normal home wt ~112 lb) SCr improving, 2.28>>2.03>>1.87  CO 30>>34>>35 Cl 103>>97>>94   K 3.4  NA 139   Continues w/ cough and remains on Supp O2 on 1L/min. Breathing improved.   Objective:   Weight Range:  Vital Signs:   Temp:  [97.7 F (36.5 C)-98.2 F (36.8 C)] 97.7 F (36.5 C) (07/18 0735) Pulse Rate:  [98-110] 98 (07/18 0735) Resp:  [15-19] 15 (07/18 0735) BP: (104-133)/(59-90) 113/67 (07/18 0735) SpO2:  [95 %-99 %] 99 % (07/18 0838) Weight:  [51.7 kg] 51.7 kg (07/18 0442) Last BM Date: 04/10/21  Weight change: Filed Weights   04/08/21 0500 04/10/21 0509 04/11/21 0442  Weight: 58.7 kg 54.5 kg 51.7 kg    Intake/Output:   Intake/Output Summary (Last 24 hours) at 04/11/2021 1045 Last data filed at 04/10/2021 2216 Gross per 24 hour  Intake 620 ml  Output --  Net 620 ml    PHYSICAL EXAM: General:  thin elderly WF, coughing on Supp O2 via [place] HEENT: normal Neck: supple.JVD 8 cm. Carotids 2+ bilat; no bruits. No lymphadenopathy or thyromegaly appreciated. Cor: PMI nondisplaced. Regula rhythm, tachy rate. No rubs, gallops or murmurs. Lungs: decreased BS at the bases, no crackles nor wheezing  Abdomen: soft, nontender, nondistended. No hepatosplenomegaly. No bruits or masses. Good bowel sounds. Extremities: no cyanosis, clubbing, rash, edema Neuro: alert & oriented x 3, cranial nerves grossly intact. moves all 4 extremities w/o difficulty. Affect pleasant.    Telemetry: Sinus Tach 103 bpm Personally reviewed   Labs: Basic Metabolic Panel: Recent Labs  Lab 04/06/21 1304 04/07/21 0445 04/07/21 0712 04/08/21 0530 04/09/21 0510 04/10/21 0110 04/11/21 0045  NA 140 138 135 137 139 138 139  K 3.7 5.1 4.9 4.1 3.9 3.9 3.4*  CL 103 104  --  105 103 97* 94*  CO2 27 24  --  24 30 34* 35*  GLUCOSE 148* 223*  --  126* 95 196* 332*   BUN 60* 65*  --  59* 63* 59* 55*  CREATININE 2.38* 2.59*  --  2.27* 2.28* 2.03* 1.87*  CALCIUM 9.0 8.7*  --  8.4* 8.9 8.8* 8.9  MG 2.4 2.4  --   --   --   --  2.0  PHOS 4.2  --   --   --   --   --  4.4    Liver Function Tests: Recent Labs  Lab 04/06/21 1304 04/07/21 0445 04/08/21 0530 04/11/21 0045  AST 53* 27 19 15   ALT 68* 51* 37 26  ALKPHOS 42 38 35* 35*  BILITOT 0.8 1.3* 0.6 1.0  PROT 5.8* 5.6* 5.2* 5.7*  ALBUMIN 3.6 3.1* 2.9* 3.2*   Recent Labs  Lab 04/06/21 1304  LIPASE 29  AMYLASE 57   No results for input(s): AMMONIA in the last 168 hours.  CBC: Recent Labs  Lab 04/06/21 0347 04/06/21 0857 04/06/21 1304 04/07/21 0445 04/07/21 0712 04/08/21 0530 04/09/21 0510 04/11/21 0045  WBC 16.1*   < > 20.6* 14.8*  --  15.2* 10.9* 11.8*  NEUTROABS 12.3*  --   --   --   --  13.6*  --  8.2*  HGB 11.9*   < > 9.9* 9.7* 10.5* 9.6* 9.7* 10.4*  HCT 40.2   < > 32.5* 31.8* 31.0* 31.7* 31.6* 33.8*  MCV 107.2*   < >  103.2* 103.2*  --  104.6* 102.6* 102.1*  PLT 651*   < > 436* 363  --  337 324 289   < > = values in this interval not displayed.    Cardiac Enzymes: No results for input(s): CKTOTAL, CKMB, CKMBINDEX, TROPONINI in the last 168 hours.  BNP: BNP (last 3 results) Recent Labs    01/19/21 0522 02/19/21 1943 04/06/21 0347  BNP 288.0* 179.0* 918.0*    ProBNP (last 3 results) No results for input(s): PROBNP in the last 8760 hours.    Other results:  Imaging: US RENAL  Result Date: 04/10/2021 CLINICAL DATA:  Acute renal insufficiency, remote history of bladder cancer EXAM: RENAL / URINARY TRACT ULTRASOUND COMPLETE COMPARISON:  01/14/2021 FINDINGS: Right Kidney: Renal measurements: 8.9 x 3.4 x 3.7 cm = volume: 58.4 mL. Echogenicity within normal limits. No mass or hydronephrosis visualized. Left Kidney: Renal measurements: 11.4 x 4.4 x 5.2 cm = volume: 135.3 mL. Echogenicity within normal limits. Lobular contour of the left kidney is again identified, with  duplicated left renal collecting system as described on numerous previous CT exams. No masses or hydronephrosis visualized. Bladder: Appears normal for degree of bladder distention. Other: None. IMPRESSION: 1. 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 2. Otherwise unremarkable renal ultrasound. Electronically Signed   By: Randa Ngo M.D.   On: 04/10/2021 16:56     Medications:     Scheduled Medications:  acetaZOLAMIDE  250 mg Oral BID   empagliflozin  10 mg Oral Daily   fluticasone furoate-vilanterol  1 puff Inhalation Daily   furosemide  40 mg Intravenous BID   heparin  5,000 Units Subcutaneous Q8H   insulin aspart  0-9 Units Subcutaneous TID WC   insulin aspart  2 Units Subcutaneous TID WC   insulin detemir  5 Units Subcutaneous Daily   melatonin  3 mg Oral Once   nicotine  14 mg Transdermal Daily   pantoprazole  40 mg Oral Daily   sodium chloride flush  3 mL Intravenous Q12H   umeclidinium bromide  1 puff Inhalation Daily    Infusions:  sodium chloride      PRN Medications: sodium chloride, acetaminophen (TYLENOL) oral liquid 160 mg/5 mL, albuterol, docusate sodium, ondansetron **OR** ondansetron (ZOFRAN) IV, sodium chloride flush   Assessment/Plan:   1. Acute hypoxic/hypercarbic respiratory failure - Likely multifactorial and related to acute on chronic HF, COPD exacerbation and superimposed pneumonia. - Likely PNA/sepsis. Completed 5 days course abx. Also on steroids.  - Treatment for COPD as per primary team   2. Acute on chronic HFrEF, Nonischemic cardiomyopathy - Echo 1/21 EF 40-45% - Echo 04/06/21 EF 25-30% RV mildly reduced. + dyssynchrony - Last co-ox 71% on 7/15 - Volume status improved but developing contraction alkalosis. I think she is close to euvolemic. Will evaluate w/ ReDs Clip. If suggestive of euvolemia, will hold diuretics.  - May need RHC to further evaluate.  - Possibly has LBBB  cardiomyopathy. I d/w EP. Consider CRT as outpatient.   3. AKI - baseline Scr ~ 1.0. - Creatinine 2.3 -> 2.0-1.9 today - Follow BMET daily    4. Diabetes with hyperglycemia - Per primary. hgbA1c = 8.1 - Continue Jardiance   5. PVD - Continue Plavix  6. COPD with ongoing tobacco use - have discussed cessation numerous times   7. Hypokalemia - K 3.4 w/ diuresis  - supp w/ KCl 40 meq x 1  Length of Stay: [place] PA-C  04/11/2021, 10:45 AM   Patient seen and examined with the above-signed Advanced Practice Provider and/or Housestaff. I personally reviewed laboratory data, imaging studies and relevant notes. I independently examined the patient and formulated the important aspects of the plan. I have edited the note to reflect any of my changes or salient points. I have personally discussed the plan with the patient and/or family.  Has diuresed well on diamox. Weight down 7 pounds. Breathing better. Denies SOB, orthopnea or PND. BP soft. RedS 27%  General:  Sitting up on side of bed.  No resp difficulty HEENT: normal Neck: supple. JVP 5-6 Carotids 2+ bilat; no bruits. No lymphadenopathy or thryomegaly appreciated. Cor: PMI nondisplaced. Regular rate & rhythm. No rubs, gallops or murmurs. Lungs: markedly decreased throughout  Abdomen: soft, nontender, nondistended. No hepatosplenomegaly. No bruits or masses. Good bowel sounds. Extremities: no cyanosis, clubbing, rash, edema Neuro: alert & orientedx3, cranial nerves grossly intact. moves all 4 extremities w/o difficulty. Affect pleasant  Volume status likely as good as we can get it. Will continue current regimen. Hopefully home soon.  Glori Bickers, MD  6:05 PM

## 2021-04-11 NOTE — Assessment & Plan Note (Signed)
-   Ankles swelling has improved since last visit, trace right lower extremity - She is maintained on lasix 40mg  daily - Following with Dr. Haroldine Laws

## 2021-04-11 NOTE — Progress Notes (Signed)
Physical Therapy Treatment Patient Details Name: Hannah French MRN: [id number] DOB: 1941-09-05 Today's Date: 04/11/2021    History of Present Illness Hannah French is a 80 y.o. female presented to the ED 7/13 via EMS for worsening shortness of breath over the last several days which has now led to some confusion.Found to have COPD exacerbation as well as CHF exacerbation. Blood glucose was over 500. Chest xray concerning for PNA.Intubated 7/13. 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 Daughter will stay with pt after DC and provide support. Pt did well with initial use of rollator and recommend one for home.    Follow Up Recommendations  Home health PT;Supervision for mobility/OOB (unless pt progresses quickly)     Equipment Recommendations  Other (comment) (rollator)    Recommendations for Other Services       Precautions / Restrictions Precautions Precautions: Fall    Mobility  Bed Mobility Overal bed mobility: Needs Assistance Bed Mobility: Supine to Sit     Supine to sit: Supervision;HOB elevated     General bed mobility comments: Incr time and effort    Transfers Overall transfer level: Needs assistance Equipment used: 4-wheeled walker Transfers: Sit to/from Stand Sit to Stand: Min guard         General transfer comment: Assist for safety  Ambulation/Gait Ambulation/Gait assistance: Min guard Gait Distance (Feet): 20 Feet (x 2) Assistive device: 4-wheeled walker Gait Pattern/deviations: Step-through pattern;Trunk flexed;Decreased stride length Gait velocity: decr Gait velocity interpretation: 1.31 - 2.62 ft/sec, indicative of limited community ambulator General Gait Details: Assist for safety and verbal cues for initial use  of rollator   Stairs             Wheelchair Mobility    Modified Rankin (Stroke Patients Only)       Balance Overall balance assessment: Needs assistance Sitting-balance support: No upper extremity supported;Feet supported Sitting balance-Leahy Scale: Fair     Standing balance support: Bilateral upper extremity supported Standing balance-Leahy Scale: Poor Standing balance comment: walker and min guard for static standing                            Cognition Arousal/Alertness: Awake/alert Behavior During Therapy: WFL for tasks assessed/performed Overall Cognitive Status: Within Functional Limits for tasks assessed                                        Exercises      General Comments General comments (skin integrity, edema, etc.): Pt on RA with SpO2 97% with amb      Pertinent Vitals/Pain Pain Assessment: No/denies pain    Home Living                      Prior Function            PT Goals (current goals can now be found in the care plan section) Acute Rehab PT Goals Patient Stated Goal: not stated Progress towards PT goals: Progressing toward goals    Frequency    Min 3X/week      PT Plan Discharge plan needs to be updated  Co-evaluation              AM-PAC PT "6 Clicks" Mobility   Outcome Measure  Help needed turning from your back to your side while in a flat bed without using bedrails?: None Help needed moving from lying on your back to sitting on the side of a flat bed without using bedrails?: A Little Help needed moving to and from a bed to a chair (including a wheelchair)?: A Little Help needed standing up from a chair using your arms (e.g., wheelchair or bedside chair)?: A Little Help needed to walk in hospital room?: A Little Help needed climbing 3-5 steps with a railing? : A Little 6 Click Score: 19    End of Session Equipment Utilized During Treatment: Gait belt Activity Tolerance:  Patient limited by fatigue Patient left: in chair;with call bell/phone within reach;with chair alarm set;with family/visitor present Nurse Communication: Mobility status PT Visit Diagnosis: Other abnormalities of gait and mobility (R26.89);Unsteadiness on feet (R26.81);Muscle weakness (generalized) (M62.81)     Time: 4944-9675 PT Time Calculation (min) (ACUTE ONLY): 24 min  Charges:  $Gait Training: 23-37 mins                     Lerna Pager [phone] Office [place] 04/11/2021, 12:03 PM

## 2021-04-11 NOTE — Assessment & Plan Note (Addendum)
-   Active smoker. Spirometry 06/10/2928 showed severe COPD, FEV1 0.96%. Treated for AECOPD in May with Doxycycline. She reports significant improved with Addition of Trelegy 169mcg daily, applying for patient assistance with PCP. She is not ready to quit smoking, declined full PFTs. Follow up in 3 months with Dr. Melvyn Novas.

## 2021-04-11 NOTE — Care Management Important Message (Signed)
Important Message  Patient Details  Name: Hannah French MRN: [id number] Date of Birth: [birth year]   Medicare Important Message Given:  Yes     Shelda Altes 04/11/2021, 10:40 AM

## 2021-04-11 NOTE — Progress Notes (Signed)
PROGRESS NOTE    Hannah French  IOE:[id number] DOB: 1941/02/20 DOA: 04/06/2021 PCP: French, Hannah Kaufmann, MD   Chief Complaint  Patient presents with   Shortness of Breath        Brief Narrative:  80 yo F with hx systolic and diastolic HF, tobacco abuse, CKD, T2DM, CAD, COPD (not on home O2) and multiple other medical problems presenting on 7/13 with  worsening shortness of breath and confusion.  She'd been started on steroids for suspected COPD exacerbation by her PCP prior to admission and told to increase her diuretics for concern for volume overload (6-8 lb weight gain prior to admission per H&P).  She required intubation and care was transferred to [facility].  Cardiology was c/s as well for her heart failure exacerbation.  She was extubated on 7/14.  Transferred to [facility] service on 7/17.  Cardiology is following and continuing diuresis at this time.   Assessment & Plan:   Active Problems:   Acute respiratory failure (HCC)   Respiratory failure (HCC)  Acute Hypoxic Respiratory Failure 2/2 COPD and HF exacerbation Treatment as noted below Wean for O2 sat >18%  Acute Systolic and Diastolic HF Exacerbation Echo 7/13 with EF 25-30%, severe septal hypokinesis and basal to mid inferior akinesis, septal lateral dyssynchrony c/w LBBB, left ventricular diastolic parameters c/w grade 2 diastolic dysfunction (see report) Continue diuresis per cardiology - continue  diuresis per cards, considering RHC - consider CRT outpatient Strict I/O, daily weights  Acute COPD Exacerbation S/p abx with azithromycin x5 days (received 2 days of ceftriaxone, cefepime x1 day and aztreonam x1 day) CXR 7/14 with slight improvement in mildly prominent interstitial markings Methylpred 7/13-7/15 Continue nebs and inhalers  T2DM  Hypoglycemia Adjust, follow closely - basal/bolus Follow closely  Continue jardiance  AKI on CKD stage IIIa Baseline creatinine in may appears to have been 1.1-1.5 Creatinine  is 1.87 today, improving Follow with diuresis Follow renal US UA at presentation appears bland  Hx PVD Nitroglycerin patch to LLE  Tobacco Abuse  GERD PPI  DVT prophylaxis: heparin Code Status: full  Family Communication: (none  Disposition:   Status is: Inpatient  Remains inpatient appropriate because:Inpatient level of care appropriate due to severity of illness  Dispo: The patient is from: Home              Anticipated d/c is to: Home              Patient currently is not medically stable to d/c.   Difficult to place patient No       Consultants:  PCCM cardiology  Procedures:  Echo IMPRESSIONS     1. Left ventricular ejection fraction, by estimation, is 25 to 30%. The  left ventricle has severely decreased function. The left ventricle  demonstrates regional wall motion abnormalities with severe septal  hypokinesis and basal to mid inferior akinesis.   There is septal-lateral dyssynchrony consistent with LBBB. Left  ventricular diastolic parameters are consistent with Grade II diastolic  dysfunction (pseudonormalization).   2. Right ventricular systolic function is mildly reduced. The right  ventricular size is normal. Tricuspid regurgitation signal is inadequate  for assessing PA pressure.   3. Left atrial size was mildly dilated.   4. The mitral valve is normal in structure. Trivial mitral valve  regurgitation. No evidence of mitral stenosis.   5. The aortic valve is tricuspid. Aortic valve regurgitation is not  visualized. Mild aortic valve sclerosis is present, with no evidence of  aortic  valve stenosis.   6. The inferior vena cava is normal in size with <50% respiratory  variability, suggesting right atrial pressure of 8 mmHg.   Antimicrobials:  Anti-infectives (From admission, onward)    Start     Dose/Rate Route Frequency Ordered Stop   04/09/21 1000  azithromycin (ZITHROMAX) tablet 250 mg        250 mg Oral Daily 04/08/21 1443 04/10/21 0941    04/08/21 1515  azithromycin (ZITHROMAX) tablet 250 mg  Status:  Discontinued        250 mg Oral Daily 04/08/21 1417 04/08/21 1443   04/07/21 1400  cefTRIAXone (ROCEPHIN) 1 g in sodium chloride 0.9 % 100 mL IVPB  Status:  Discontinued        1 g 200 mL/hr over 30 Minutes Intravenous Every 24 hours 04/07/21 0801 04/08/21 1417   04/07/21 0815  vancomycin (VANCOREADY) IVPB 750 mg/150 mL  Status:  Discontinued        750 mg 150 mL/hr over 60 Minutes Intravenous  Once 04/07/21 0721 04/07/21 0806   04/06/21 1430  ceFEPIme (MAXIPIME) 2 g in sodium chloride 0.9 % 100 mL IVPB  Status:  Discontinued        2 g 200 mL/hr over 30 Minutes Intravenous Every 24 hours 04/06/21 1323 04/07/21 0806   04/06/21 1326  vancomycin variable dose per unstable renal function (pharmacist dosing)  Status:  Discontinued         Does not apply See admin instructions 04/06/21 1326 04/07/21 0806   04/06/21 0900  azithromycin (ZITHROMAX) 500 mg in sodium chloride 0.9 % 250 mL IVPB  Status:  Discontinued        500 mg 250 mL/hr over 60 Minutes Intravenous Every 24 hours 04/06/21 0826 04/08/21 1417   04/06/21 0415  vancomycin (VANCOCIN) IVPB 1000 mg/200 mL premix        1,000 mg 200 mL/hr over 60 Minutes Intravenous  Once 04/06/21 0405 04/06/21 0515   04/06/21 0415  aztreonam (AZACTAM) 2 g in sodium chloride 0.9 % 100 mL IVPB        2 g 200 mL/hr over 30 Minutes Intravenous  Once 04/06/21 0405 04/06/21 0445          Subjective: No new complaints  Objective: Vitals:   04/11/21 0442 04/11/21 0735 04/11/21 0838 04/11/21 1153  BP: 104/90 113/67  96/71  Pulse:  98  95  Resp: 18 15  18   Temp: 98.2 F (36.8 C) 97.7 F (36.5 C)  97.9 F (36.6 C)  TempSrc: Oral Oral  Oral  SpO2: 95% 96% 99% 96%  Weight: 51.7 kg     Height:        Intake/Output Summary (Last 24 hours) at 04/11/2021 1234 Last data filed at 04/10/2021 2216 Gross per 24 hour  Intake 120 ml  Output --  Net 120 ml   Filed Weights   04/08/21 0500  04/10/21 0509 04/11/21 0442  Weight: 58.7 kg 54.5 kg 51.7 kg    Examination:  General: No acute distress. Cardiovascular: Heart sounds show Hannah French regular rate, and rhythm - JVD difficult to appreciate Lungs: Clear to auscultation bilaterally  Abdomen: Soft, nontender, nondistended  Neurological: Alert and oriented 3. Moves all extremities 4 . Cranial nerves II through XII grossly intact. Skin: Warm and dry. No rashes or lesions. Extremities: No clubbing or cyanosis. No significant LEE   Data Reviewed: I have personally reviewed following labs and imaging studies  CBC: Recent Labs  Lab 04/06/21 0347 04/06/21 0857  04/06/21 1304 04/07/21 0445 04/07/21 0712 04/08/21 0530 04/09/21 0510 04/11/21 0045  WBC 16.1*   < > 20.6* 14.8*  --  15.2* 10.9* 11.8*  NEUTROABS 12.3*  --   --   --   --  13.6*  --  8.2*  HGB 11.9*   < > 9.9* 9.7* 10.5* 9.6* 9.7* 10.4*  HCT 40.2   < > 32.5* 31.8* 31.0* 31.7* 31.6* 33.8*  MCV 107.2*   < > 103.2* 103.2*  --  104.6* 102.6* 102.1*  PLT 651*   < > 436* 363  --  337 324 289   < > = values in this interval not displayed.    Basic Metabolic Panel: Recent Labs  Lab 04/06/21 1304 04/07/21 0445 04/07/21 0712 04/08/21 0530 04/09/21 0510 04/10/21 0110 04/11/21 0045  NA 140 138 135 137 139 138 139  K 3.7 5.1 4.9 4.1 3.9 3.9 3.4*  CL 103 104  --  105 103 97* 94*  CO2 27 24  --  24 30 34* 35*  GLUCOSE 148* 223*  --  126* 95 196* 332*  BUN 60* 65*  --  59* 63* 59* 55*  CREATININE 2.38* 2.59*  --  2.27* 2.28* 2.03* 1.87*  CALCIUM 9.0 8.7*  --  8.4* 8.9 8.8* 8.9  MG 2.4 2.4  --   --   --   --  2.0  PHOS 4.2  --   --   --   --   --  4.4    GFR: Estimated Creatinine Clearance: 16.4 mL/min (Jizelle Conkey) (by C-G formula based on SCr of 1.87 mg/dL (H)).  Liver Function Tests: Recent Labs  Lab 04/06/21 1304 04/07/21 0445 04/08/21 0530 04/11/21 0045  AST 53* 27 19 15   ALT 68* 51* 37 26  ALKPHOS 42 38 35* 35*  BILITOT 0.8 1.3* 0.6 1.0  PROT 5.8* 5.6* 5.2*  5.7*  ALBUMIN 3.6 3.1* 2.9* 3.2*    CBG: Recent Labs  Lab 04/10/21 1126 04/10/21 1659 04/10/21 2109 04/11/21 0734 04/11/21 1151  GLUCAP 204* 202* 228* 246* 226*     Recent Results (from the past 240 hour(s))  Resp Panel by RT-PCR (Flu Munir Victorian&B, Covid) Nasopharyngeal Swab     Status: None   Collection Time: 04/06/21  3:14 AM   Specimen: Nasopharyngeal Swab; Nasopharyngeal(NP) swabs in vial transport medium  Result Value Ref Range Status   SARS Coronavirus 2 by RT PCR NEGATIVE NEGATIVE Final    Comment: (NOTE) SARS-CoV-2 target nucleic acids are NOT DETECTED.  The SARS-CoV-2 RNA is generally detectable in upper respiratory specimens during the acute phase of infection. The lowest concentration of SARS-CoV-2 viral copies this assay can detect is 138 copies/mL. Margerite Impastato negative result does not preclude SARS-Cov-2 infection and should not be used as the sole basis for treatment or other patient management decisions. Maryclare Nydam negative result may occur with  improper specimen collection/handling, submission of specimen other than nasopharyngeal swab, presence of viral mutation(s) within the areas targeted by this assay, and inadequate number of viral copies(<138 copies/mL). Tine Mabee negative result must be combined with clinical observations, patient history, and epidemiological information. The expected result is Negative.  Fact Sheet for Patients:  EntrepreneurPulse.com.au  Fact Sheet for Healthcare Providers:  IncredibleEmployment.be  This test is no t yet approved or cleared by the [place] FDA and  has been authorized for detection and/or diagnosis of SARS-CoV-2 by FDA under an Emergency Use Authorization (EUA). This EUA will remain  in effect (meaning this test can  be used) for the duration of the COVID-19 declaration under Section 564(b)(1) of the Act, 21 U.S.C.section 360bbb-3(b)(1), unless the authorization is terminated  or revoked sooner.        Influenza Maleko Greulich by PCR NEGATIVE NEGATIVE Final   Influenza B by PCR NEGATIVE NEGATIVE Final    Comment: (NOTE) The Xpert Xpress SARS-CoV-2/FLU/RSV plus assay is intended as an aid in the diagnosis of influenza from Nasopharyngeal swab specimens and should not be used as Jaivon Vanbeek sole basis for treatment. Nasal washings and aspirates are unacceptable for Xpert Xpress SARS-CoV-2/FLU/RSV testing.  Fact Sheet for Patients: EntrepreneurPulse.com.au  Fact Sheet for Healthcare Providers: IncredibleEmployment.be  This test is not yet approved or cleared by the [place] FDA and has been authorized for detection and/or diagnosis of SARS-CoV-2 by FDA under an Emergency Use Authorization (EUA). This EUA will remain in effect (meaning this test can be used) for the duration of the COVID-19 declaration under Section 564(b)(1) of the Act, 21 U.S.C. section 360bbb-3(b)(1), unless the authorization is terminated or revoked.  Performed at [facility], [address], [place], [place] [address]   Blood culture (routine x 2)     Status: None   Collection Time: 04/06/21  3:48 AM   Specimen: BLOOD RIGHT FOREARM  Result Value Ref Range Status   Specimen Description BLOOD RIGHT FOREARM  Final   Special Requests   Final    BOTTLES DRAWN AEROBIC AND ANAEROBIC Blood Culture adequate volume   Culture   Final    NO GROWTH 5 DAYS Performed at [facility], [address] [place], [place] [address]    Report Status 04/11/2021 FINAL  Final  Blood culture (routine x 2)     Status: None   Collection Time: 04/06/21  3:50 AM   Specimen: BLOOD LEFT FOREARM  Result Value Ref Range Status   Specimen Description BLOOD LEFT FOREARM  Final   Special Requests   Final    BOTTLES DRAWN AEROBIC AND ANAEROBIC Blood Culture adequate volume   Culture   Final    NO GROWTH 5 DAYS Performed at [facility], [address], [place], [place] [address]    Report Status 04/11/2021 FINAL  Final   MRSA Next Gen by PCR, Nasal     Status: None   Collection Time: 04/06/21 11:37 AM   Specimen: Nasal Swab  Result Value Ref Range Status   MRSA by PCR Next Gen NOT DETECTED NOT DETECTED Final    Comment: (NOTE) The GeneXpert MRSA Assay (FDA approved for NASAL specimens only), is one component of Montrae Braithwaite comprehensive MRSA colonization surveillance program. It is not intended to diagnose MRSA infection nor to guide or monitor treatment for MRSA infections. Test performance is not FDA approved in patients less than 9 years old. Performed at [facility], [address], [place], [name] [address]   Urine culture     Status: None   Collection Time: 04/06/21  1:04 PM   Specimen: Urine, Random  Result Value Ref Range Status   Specimen Description URINE, RANDOM  Final   Special Requests NONE  Final   Culture   Final    NO GROWTH Performed at [facility], [place] [address], [place],  [address]    Report Status 04/07/2021 FINAL  Final  Culture, Respiratory w Gram Stain     Status: None   Collection Time: 04/06/21  3:59 PM   Specimen: Tracheal Aspirate; Respiratory  Result Value Ref Range Status  Specimen Description TRACHEAL ASPIRATE  Final   Special Requests NONE  Final   Gram Stain NO WBC SEEN NO ORGANISMS SEEN   Final   Culture   Final    RARE Normal respiratory flora-no Staph aureus or Pseudomonas seen Performed at [facility] [address], [place], [place] [address]    Report Status 04/09/2021 FINAL  Final         Radiology Studies: US RENAL  Result Date: 04/10/2021 CLINICAL DATA:  Acute renal insufficiency, remote history of bladder cancer EXAM: RENAL / URINARY TRACT ULTRASOUND COMPLETE COMPARISON:  01/14/2021 FINDINGS: Right Kidney: Renal measurements: 8.9 x 3.4 x 3.7 cm = volume: 58.4 mL. Echogenicity within normal limits. No mass or hydronephrosis visualized. Left Kidney: Renal measurements: 11.4 x 4.4 x 5.2 cm = volume: 135.3 mL. Echogenicity  within normal limits. Lobular contour of the left kidney is again identified, with duplicated left renal collecting system as described on numerous previous CT exams. No masses or hydronephrosis visualized. Bladder: Appears normal for degree of bladder distention. Other: None. IMPRESSION: 1. 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 2. Otherwise unremarkable renal ultrasound. Electronically Signed   By: Randa Ngo M.D.   On: 04/10/2021 16:56        Scheduled Meds:  acetaZOLAMIDE  250 mg Oral BID   dextromethorphan-guaiFENesin  1 tablet Oral BID   empagliflozin  10 mg Oral Daily   fluticasone furoate-vilanterol  1 puff Inhalation Daily   heparin  5,000 Units Subcutaneous Q8H   insulin aspart  0-9 Units Subcutaneous TID WC   insulin aspart  2 Units Subcutaneous TID WC   insulin detemir  5 Units Subcutaneous Daily   melatonin  3 mg Oral Once   nicotine  14 mg Transdermal Daily   pantoprazole  40 mg Oral Daily   potassium chloride  40 mEq Oral Once   sodium chloride flush  3 mL Intravenous Q12H   umeclidinium bromide  1 puff Inhalation Daily   Continuous Infusions:  sodium chloride       LOS: 5 days    Time spent: over 30 min    Fayrene Helper, MD [facility]   To contact the attending provider between 7A-7P or the covering provider during after hours 7P-7A, please log into the web site [email or web address] and access using universal [place] password for that web site. If you do not have the password, please call the hospital operator.  04/11/2021, 12:34 PM

## 2021-04-11 NOTE — Assessment & Plan Note (Signed)
-   Ordered for 6MWT during last visit, patient declined testing. O2 today was 97% RA walking back to exam room. Breathing non-labored.

## 2021-04-11 NOTE — Progress Notes (Signed)
REDS VEST / Clip  READING=   27 CHEST RULER=  25  Device FITTING TASKS: Clip Station = A POSTURE= HEIGHT MARKER= CENTER STRIP=    Bonnita Nasuti Pharm.D. CPP, BCPS Clinical Pharmacist [phone] 04/11/2021 12:33 PM

## 2021-04-11 NOTE — Progress Notes (Signed)
Occupational Therapy Treatment Patient Details Name: Hannah French MRN: [id number] DOB: [birth year] Today's Date: 04/11/2021    History of present illness Hannah French is a 80 y.o. female presented to the ED 7/13 via EMS for worsening shortness of breath over the last several days which has now led to some confusion.Found to have COPD exacerbation as well as CHF exacerbation. Blood glucose was over 500. Chest xray concerning for PNA.Intubated 7/13. 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 Pt completed bed mobility and sit<>stand with min guard given vc throughout for safety. Pt tolerated grooming while standing at the sink for >5 minutes with no rest break given min guard for safety. Pt required vc for proper use of RW, and she stated she has never used  walker and "It's going to take some getting used to." Pt observed with some safety and impulsivity concerns throughout session. VSS on RA. Pt continues to benefit from continued OT acutely to progress function and safe mobility. D/c plan remains appropriate.    Follow Up Recommendations  Home health OT;Supervision/Assistance - 24 hour    Equipment Recommendations  3 in 1 bedside commode;Other (comment) (Rollator vs. RW)       Precautions / Restrictions Precautions Precautions: Fall Restrictions Weight Bearing Restrictions: No       Mobility Bed Mobility Overal bed mobility: Needs Assistance Bed Mobility: Supine to Sit     Supine to sit: Supervision;HOB elevated     General bed mobility comments: Incr time and effort    Transfers Overall transfer level: Needs assistance Equipment used: Rolling walker (2 wheeled) Transfers: Sit to/from Stand Sit to Stand: Min guard         General transfer comment: 2 attempts to stand, vc  for hand placement, min  guard for safety    Balance Overall balance assessment: Needs assistance Sitting-balance support: No upper extremity supported;Feet supported Sitting balance-Leahy Scale: Fair     Standing balance support: Single extremity supported;During functional activity Standing balance-Leahy Scale: Fair Standing balance comment: min guard fro static standing, at least 1 UE supported externally for functional activity while standing at the sink       ADL either performed or assessed with clinical judgement   ADL       Grooming: Wash/dry hands;Wash/dry face;Oral care;Brushing hair;Min guard;Standing (at sink)           Functional mobility during ADLs: Min guard;Cueing for safety;Rolling walker General ADL Comments: Pt completed all grooming tasks while standing at the sink, intermittently resting BUE on sink ledge for rest break      Cognition Arousal/Alertness: Awake/alert Behavior During Therapy: WFL for tasks assessed/performed Overall Cognitive Status: Within Functional Limits for tasks assessed                                                General Comments VSS on RA - SpO2 at 92% after >5 minutes of standing and functional activity    Pertinent Vitals/ Pain       Pain Assessment: No/denies pain Pain Intervention(s): Monitored during session   Frequency  Min 2X/week        Progress Toward Goals  OT Goals(current goals can now be found in the care plan section)  Progress towards OT goals: Progressing toward goals  Acute Rehab OT Goals Patient Stated Goal: to feel stronger OT Goal Formulation: With patient Time For Goal Achievement: 04/23/21 Potential to Achieve Goals: Good ADL Goals Pt Will Perform Grooming: with supervision;standing Pt Will Perform Lower Body Bathing: with supervision;sit to/from stand Pt Will Perform Lower Body Dressing: with supervision;sit to/from stand Pt Will Transfer to Toilet: with modified  independence;ambulating;regular height toilet Pt Will Perform Toileting - Clothing Manipulation and hygiene: with modified independence;sit to/from stand  Plan Discharge plan remains appropriate       AM-PAC OT "6 Clicks" Daily Activity     Outcome Measure   Help from another person eating meals?: None Help from another person taking care of personal grooming?: A Little Help from another person toileting, which includes using toliet, bedpan, or urinal?: A Little Help from another person bathing (including washing, rinsing, drying)?: A Little Help from another person to put on and taking off regular upper body clothing?: None Help from another person to put on and taking off regular lower body clothing?: A Little 6 Click Score: 20    End of Session Equipment Utilized During Treatment: Gait belt;Rolling walker  OT Visit Diagnosis: Unsteadiness on feet (R26.81);Muscle weakness (generalized) (M62.81);Pain   Activity Tolerance Patient tolerated treatment well   Patient Left in bed;with call bell/phone within reach;with bed alarm set   Nurse Communication Mobility status        Time: 1345-1402 OT Time Calculation (min): 17 min  Charges: OT General Charges $OT Visit: 1 Visit OT Treatments $Self Care/Home Management : 8-22 mins    Hannah French 04/11/2021, 2:12 PM

## 2021-04-12 ENCOUNTER — Other Ambulatory Visit (HOSPITAL_COMMUNITY): Payer: Self-pay

## 2021-04-12 DIAGNOSIS — J9601 Acute respiratory failure with hypoxia: Secondary | ICD-10-CM | POA: Diagnosis not present

## 2021-04-12 DIAGNOSIS — J9621 Acute and chronic respiratory failure with hypoxia: Secondary | ICD-10-CM | POA: Diagnosis not present

## 2021-04-12 DIAGNOSIS — J9622 Acute and chronic respiratory failure with hypercapnia: Secondary | ICD-10-CM | POA: Diagnosis not present

## 2021-04-12 DIAGNOSIS — I5023 Acute on chronic systolic (congestive) heart failure: Secondary | ICD-10-CM | POA: Diagnosis not present

## 2021-04-12 LAB — COMPREHENSIVE METABOLIC PANEL
ALT: 21 U/L (ref 0–44)
AST: 15 U/L (ref 15–41)
Albumin: 3.4 g/dL — ABNORMAL LOW (ref 3.5–5.0)
Alkaline Phosphatase: 36 U/L — ABNORMAL LOW (ref 38–126)
Anion gap: 9 (ref 5–15)
BUN: 53 mg/dL — ABNORMAL HIGH (ref 8–23)
CO2: 30 mmol/L (ref 22–32)
Calcium: 9.1 mg/dL (ref 8.9–10.3)
Chloride: 100 mmol/L (ref 98–111)
Creatinine, Ser: 1.73 mg/dL — ABNORMAL HIGH (ref 0.44–1.00)
GFR, Estimated: 30 mL/min — ABNORMAL LOW (ref >60)
Glucose, Bld: 181 mg/dL — ABNORMAL HIGH (ref 70–99)
Potassium: 4.6 mmol/L (ref 3.5–5.1)
Sodium: 139 mmol/L (ref 135–145)
Total Bilirubin: 1.2 mg/dL (ref 0.3–1.2)
Total Protein: 6.2 g/dL — ABNORMAL LOW (ref 6.5–8.1)

## 2021-04-12 LAB — CBC WITH DIFFERENTIAL/PLATELET
Abs Immature Granulocytes: 0.39 10*3/uL — ABNORMAL HIGH (ref 0.00–0.07)
Basophils Absolute: 0.1 10*3/uL (ref 0.0–0.1)
Basophils Relative: 0 %
Eosinophils Absolute: 0.4 10*3/uL (ref 0.0–0.5)
Eosinophils Relative: 3 %
HCT: 34.9 % — ABNORMAL LOW (ref 36.0–46.0)
Hemoglobin: 10.6 g/dL — ABNORMAL LOW (ref 12.0–15.0)
Immature Granulocytes: 3 %
Lymphocytes Relative: 15 %
Lymphs Abs: 1.9 10*3/uL (ref 0.7–4.0)
MCH: 31 pg (ref 26.0–34.0)
MCHC: 30.4 g/dL (ref 30.0–36.0)
MCV: 102 fL — ABNORMAL HIGH (ref 80.0–100.0)
Monocytes Absolute: 1 10*3/uL (ref 0.1–1.0)
Monocytes Relative: 8 %
Neutro Abs: 8.9 10*3/uL — ABNORMAL HIGH (ref 1.7–7.7)
Neutrophils Relative %: 71 %
Platelets: 284 10*3/uL (ref 150–400)
RBC: 3.42 MIL/uL — ABNORMAL LOW (ref 3.87–5.11)
RDW: 23.5 % — ABNORMAL HIGH (ref 11.5–15.5)
WBC: 12.6 10*3/uL — ABNORMAL HIGH (ref 4.0–10.5)
nRBC: 0.2 % (ref 0.0–0.2)

## 2021-04-12 LAB — MAGNESIUM: Magnesium: 2.2 mg/dL (ref 1.7–2.4)

## 2021-04-12 LAB — GLUCOSE, CAPILLARY
Glucose-Capillary: 196 mg/dL — ABNORMAL HIGH (ref 70–99)
Glucose-Capillary: 260 mg/dL — ABNORMAL HIGH (ref 70–99)
Glucose-Capillary: 187 mg/dL — ABNORMAL HIGH (ref 70–99)

## 2021-04-12 LAB — PHOSPHORUS: Phosphorus: 3.6 mg/dL (ref 2.5–4.6)

## 2021-04-12 MED ORDER — EMPAGLIFLOZIN 10 MG PO TABS: 10.0000 mg | ORAL_TABLET | Freq: Every day | ORAL | 0 refills | Status: AC

## 2021-04-12 MED ORDER — FUROSEMIDE 40 MG PO TABS
40.0000 mg | ORAL_TABLET | Freq: Every day | ORAL | Status: DC
  Administered 2021-04-12: 40 mg via ORAL
  Filled 2021-04-12: qty 1

## 2021-04-12 MED ORDER — FUROSEMIDE 40 MG PO TABS: 40.0000 mg | ORAL_TABLET | Freq: Every day | ORAL | Status: DC

## 2021-04-12 MED ORDER — CLOPIDOGREL BISULFATE 75 MG PO TABS
75.0000 mg | ORAL_TABLET | Freq: Every day | ORAL | Status: DC
  Administered 2021-04-12: 75 mg via ORAL
  Filled 2021-04-12: qty 1

## 2021-04-12 MED ORDER — TRESIBA FLEXTOUCH 100 UNIT/ML ~~LOC~~ SOPN: 7.0000 [IU] | PEN_INJECTOR | Freq: Every day | SUBCUTANEOUS | 3 refills | Status: AC

## 2021-04-12 MED ORDER — INSULIN ASPART 100 UNIT/ML IJ SOLN: 0.0000 [IU] | Freq: Three times a day (TID) | INTRAMUSCULAR | 1 refills | Status: AC

## 2021-04-12 NOTE — TOC Initial Note (Addendum)
Transition of Care [facility]) - Initial/Assessment Note    Patient Details  Name: Hannah French MRN: [id number] Date of Birth: 1941/03/11  [facility] CM/SW Contact:    Bethena Roys, RN Phone Number: 04/12/2021, 12:33 PM  Clinical Narrative:  Risk for readmission assessment completed. Prior to arrival patient was from home alone. Per patient, her daughter will stay with her once she gets home. Patient is agreeable to home health services registered nurse and physical therapy. Patient did not have a preference with agency choice, she just wanted an agency in network. Case Manager called [facility] and they are unable to service the patient due to limited staff. Case Manager called [facility] and they will be able to service the patient for the above disciplines. Start of care to begin within 24-48 hours post transition home. Patient is agreeable to a rollator via [employer] and she has declined the bedside commode. Referral sent to [employer] and the rollator will be delivered to the bedside prior to transition home. No further needs from Case Manager at this time.                 04-12-21 1656 Oxygen has been ordered via [employer]. Oxygen will be delivered to the room prior to d/c home. No further needs from Case Manager at this time.   Expected Discharge Plan: [place] Barriers to Discharge: Continued Medical Work up   Patient Goals and CMS Choice Patient states their goals for this hospitalization and ongoing recovery are:: going home with daughter support   Choice offered to / list presented to : NA (Patient did not have a preference- [facility] is in network and patient is agreeable to services.)  Expected Discharge Plan and Services Expected Discharge Plan: [place] In-house Referral: NA Discharge Planning Services: CM Consult Post Acute Care Choice: [place] arrangements for the past 2 months: Single Family  Home                 DME Arranged: Walker rolling with seat DME Agency: [facility] Date DME Agency Contacted: 04/12/21 Time DME Agency Contacted: 1000 Representative spoke with at DME Agency: Freda Munro HH Arranged: RN, Disease Management, PT [place] Agency: Other - See comment ([facility] [place]) Date [place]: 04/11/21 Time HH Agency Contacted: 1600 Representative spoke with at [place]: Amy  Prior Living Arrangements/Services Living arrangements for the past 2 months: [place] with:: Self (Per patient her daughter will be staying with her once she gets home.) Patient language and need for interpreter reviewed:: Yes Do you feel safe going back to the place where you live?: Yes      Need for Family Participation in Patient Care: Yes (Comment) Care giver support system in place?: Yes (comment) Current home services: DME Criminal Activity/Legal Involvement Pertinent to Current Situation/Hospitalization: No - Comment as needed  Activities of Daily Living      Permission Sought/Granted Permission sought to share information with : Case Manager, Customer service manager, Family Supports Permission granted to share information with : Yes, Verbal Permission Granted     Permission granted to share info w AGENCY: [facility]        Emotional Assessment Appearance:: Appears stated age Attitude/Demeanor/Rapport: Engaged Affect (typically observed): Appropriate Orientation: : Oriented to Situation, Oriented to Self, Oriented to Place, Oriented to  Time Alcohol / Substance Use: Not Applicable Psych Involvement: No (comment)  Admission diagnosis:  Acute respiratory failure (  [place]) [J96.00] Hypoxemia [R09.02] Hyperglycemia [R73.9] History of heart failure [Z86.79] AKI (acute kidney injury) ([place]) [N17.9] Chronic obstructive pulmonary disease with acute exacerbation (HCC) [J44.1] Severe sepsis ([place]) [A41.9, R65.20] Acute on chronic respiratory failure with  hypoxia and hypercapnia (HCC) [J96.21, J96.22] Respiratory failure ([place]) [J96.90] Patient Active Problem List   Diagnosis Date Noted   Acute respiratory failure ([place]) 04/06/2021   Respiratory failure ([place]) 04/06/2021   Syncope 02/19/2021   Acute GI bleeding 02/19/2021   Acute on chronic blood loss anemia 02/19/2021   Acute respiratory failure with hypoxia ([place]) 01/17/2021   Elevated MCV 01/17/2021   Leukocytosis 01/17/2021   CKD (chronic kidney disease), stage III ([place]) 01/17/2021   Hyperglycemia due to diabetes mellitus ([place]) 01/17/2021   Skin abscess 01/15/2020   Depression, recurrent ([place]) 08/08/2019   Cigarette smoker 06/19/2019   Vitamin D deficiency 01/29/2019   Dizziness 12/03/2018   Chronic combined systolic and diastolic CHF (congestive heart failure) ([place]) 12/03/2018   Coronary artery disease involving native coronary artery of native heart with angina pectoris ([place]) 10/04/2018   Hyperlipidemia 10/04/2018   Pain in right hand 10/02/2018   Myalgia due to statin 03/29/2018   Stage 3 severe COPD by GOLD classification ([place]) 10/26/2017   Tobacco abuse 03/07/2016   Type 2 diabetes mellitus ([place]) 08/26/2015   Osteoporosis 09/02/2014   Carotid stenosis 07/22/2012   PVD 07/02/2009   BLADDER CANCER 06/05/2009   Hyperlipidemia associated with type 2 diabetes mellitus ([place]) 06/05/2009   PSEUDOGOUT 06/05/2009   MITRAL VALVE PROLAPSE 06/05/2009   PCP:  Dettinger, Fransisca Kaufmann, MD Pharmacy:   [facility], [address] [place] [address] Phone: [phone] Fax: [phone]  [facility] [address], [place] [address] [place] [address] Phone: [phone] Fax: [phone]     Social Determinants of Health (SDOH) Interventions    Readmission Risk Interventions Readmission Risk Prevention Plan 04/12/2021 01/18/2021  Transportation Screening Complete Complete  HRI or [place] - Complete  Social Work Consult for [place] Planning/Counseling - Complete  Palliative Care Screening - Not Applicable  Medication Review Press photographer) Complete Complete  PCP or Specialist appointment within 3-5 days of discharge Complete -  [place] or Home Care Consult Complete -  SW Recovery Care/Counseling Consult Complete -  Palliative Care Screening Not Applicable -  [place] Not Applicable -  Some recent data might be hidden

## 2021-04-12 NOTE — Progress Notes (Addendum)
Advanced Heart Failure Rounding Note   Subjective:    Diamox added 07/17. Lasix held yesterday d/t contraction alkalosis.  UOP not recorded. Wt up 2.5 lb overnight, 116 lb today (reports normal home wt ~112 lb) SCr improving, 2.28>>2.03>>1.87>>1.7  CO 30>>34>>35>>30 Cl 103>>97>>94>>100  K 4.6 NA 139  Cough improved with tessalon and mucinex. No dyspnea at rest. Denies chest pain. Left 4th toe tender. Better with nitro patch.   Objective:   Weight Range:  Vital Signs:   Temp:  [97.4 F (36.3 C)-98.2 F (36.8 C)] 97.6 F (36.4 C) (07/19 0734) Pulse Rate:  [95-105] 105 (07/19 0734) Resp:  [16-18] 16 (07/19 0734) BP: (89-114)/(54-71) 103/59 (07/19 0734) SpO2:  [91 %-97 %] 91 % (07/19 0734) FiO2 (%):  [2 %] 2 % (07/19 0557) Weight:  [52.8 kg] 52.8 kg (07/19 0557) Last BM Date: 04/11/21  Weight change: Filed Weights   04/10/21 0509 04/11/21 0442 04/12/21 0557  Weight: 54.5 kg 51.7 kg 52.8 kg    Intake/Output:  No intake or output data in the 24 hours ending 04/12/21 1028   PHYSICAL EXAM: General:  thin, elderly WF, on Supp O2 via [place] HEENT: normal Neck: supple.No JVD. Carotids 2+ bilat; no bruits. No lymphadenopathy or thyromegaly appreciated. Cor: PMI nondisplaced. Regular rhythm, tachycardic. No rubs, gallops or murmurs. Lungs: decreased BS throughout Abdomen: soft, nontender, nondistended. No hepatosplenomegaly. No bruits or masses. Good bowel sounds. Extremities: no cyanosis, clubbing, rash, small scab on plantar aspect of left 4th toe. Discoloration to left 3rd and 4th toes Neuro: alert & oriented x 3, cranial nerves grossly intact. moves all 4 extremities w/o difficulty. Affect pleasant.    Telemetry: Sinus/sinus tach, rates 90s-100s. Occasional PVCs (personally reviewed).   Labs: Basic Metabolic Panel: Recent Labs  Lab 04/06/21 1304 04/07/21 0445 04/07/21 0712 04/08/21 0530 04/09/21 0510 04/10/21 0110 04/11/21 0045 04/12/21 0120  NA 140 138    < > 137 139 138 139 139  K 3.7 5.1   < > 4.1 3.9 3.9 3.4* 4.6  CL 103 104  --  105 103 97* 94* 100  CO2 27 24  --  24 30 34* 35* 30  GLUCOSE 148* 223*  --  126* 95 196* 332* 181*  BUN 60* 65*  --  59* 63* 59* 55* 53*  CREATININE 2.38* 2.59*  --  2.27* 2.28* 2.03* 1.87* 1.73*  CALCIUM 9.0 8.7*  --  8.4* 8.9 8.8* 8.9 9.1  MG 2.4 2.4  --   --   --   --  2.0 2.2  PHOS 4.2  --   --   --   --   --  4.4 3.6   < > = values in this interval not displayed.    Liver Function Tests: Recent Labs  Lab 04/06/21 1304 04/07/21 0445 04/08/21 0530 04/11/21 0045 04/12/21 0120  AST 53* 27 19 15 15   ALT 68* 51* 37 26 21  ALKPHOS 42 38 35* 35* 36*  BILITOT 0.8 1.3* 0.6 1.0 1.2  PROT 5.8* 5.6* 5.2* 5.7* 6.2*  ALBUMIN 3.6 3.1* 2.9* 3.2* 3.4*   Recent Labs  Lab 04/06/21 1304  LIPASE 29  AMYLASE 57   No results for input(s): AMMONIA in the last 168 hours.  CBC: Recent Labs  Lab 04/06/21 0347 04/06/21 0857 04/07/21 0445 04/07/21 0712 04/08/21 0530 04/09/21 0510 04/11/21 0045 04/12/21 0120  WBC 16.1*   < > 14.8*  --  15.2* 10.9* 11.8* 12.6*  NEUTROABS 12.3*  --   --   --  13.6*  --  8.2* 8.9*  HGB 11.9*   < > 9.7* 10.5* 9.6* 9.7* 10.4* 10.6*  HCT 40.2   < > 31.8* 31.0* 31.7* 31.6* 33.8* 34.9*  MCV 107.2*   < > 103.2*  --  104.6* 102.6* 102.1* 102.0*  PLT 651*   < > 363  --  337 324 289 284   < > = values in this interval not displayed.    Cardiac Enzymes: No results for input(s): CKTOTAL, CKMB, CKMBINDEX, TROPONINI in the last 168 hours.  BNP: BNP (last 3 results) Recent Labs    01/19/21 0522 02/19/21 1943 04/06/21 0347  BNP 288.0* 179.0* 918.0*    ProBNP (last 3 results) No results for input(s): PROBNP in the last 8760 hours.    Other results:  Imaging: US RENAL  Result Date: 04/10/2021 CLINICAL DATA:  Acute renal insufficiency, remote history of bladder cancer EXAM: RENAL / URINARY TRACT ULTRASOUND COMPLETE COMPARISON:  01/14/2021 FINDINGS: Right Kidney: Renal  measurements: 8.9 x 3.4 x 3.7 cm = volume: 58.4 mL. Echogenicity within normal limits. No mass or hydronephrosis visualized. Left Kidney: Renal measurements: 11.4 x 4.4 x 5.2 cm = volume: 135.3 mL. Echogenicity within normal limits. Lobular contour of the left kidney is again identified, with duplicated left renal collecting system as described on numerous previous CT exams. No masses or hydronephrosis visualized. Bladder: Appears normal for degree of bladder distention. Other: None. IMPRESSION: 1. 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 2. Otherwise unremarkable renal ultrasound. Electronically Signed   By: Randa Ngo M.D.   On: 04/10/2021 16:56     Medications:     Scheduled Medications:  acetaZOLAMIDE  250 mg Oral BID   dextromethorphan-guaiFENesin  1 tablet Oral BID   empagliflozin  10 mg Oral Daily   fluticasone furoate-vilanterol  1 puff Inhalation Daily   heparin  5,000 Units Subcutaneous Q8H   insulin aspart  0-9 Units Subcutaneous TID WC   insulin aspart  2 Units Subcutaneous TID WC   insulin detemir  5 Units Subcutaneous Daily   melatonin  3 mg Oral Once   nicotine  14 mg Transdermal Daily   nitroGLYCERIN  0.2 mg Transdermal Daily   pantoprazole  40 mg Oral Daily   sodium chloride flush  3 mL Intravenous Q12H   umeclidinium bromide  1 puff Inhalation Daily    Infusions:  sodium chloride      PRN Medications: sodium chloride, acetaminophen (TYLENOL) oral liquid 160 mg/5 mL, albuterol, benzonatate, docusate sodium, ondansetron **OR** ondansetron (ZOFRAN) IV, sodium chloride flush   Assessment/Plan:   1. Acute hypoxic/hypercarbic respiratory failure - Likely multifactorial and related to acute on chronic HF, COPD exacerbation and superimposed pneumonia. - Likely PNA/sepsis. Completed 5 days course abx. Also on steroids.  - Treatment for COPD as per primary team   2. Acute on chronic HFrEF,  Nonischemic cardiomyopathy - Echo 1/21 EF 40-45% - Echo 04/06/21 EF 25-30% RV mildly reduced. + dyssynchrony - Last co-ox 71% on 7/15 - RedS 27% yesterday. IV furosemide has been held. - Weight up 2.5 lb overnight. Above baseline weight. Does not appear overtly overloaded. - Continues on diamox. CO2 35>>30. Will stop diamox and restart prior home dose of furosemide at 40 mg daily. - Continue jardiance. - Addition of ARNI and spiro limited by AKI and soft blood pressures. - No beta blocker d/t CHF exacerbation. - May need RHC to further evaluate.  -  Possibly has LBBB cardiomyopathy. D/w EP. Consider CRT as outpatient.   3. AKI - baseline Scr ~ 1.0. - Creatinine 2.3 > 2.0 > 1.9 > 1.7 - Follow BMET daily    4. Diabetes with hyperglycemia - Per primary. hgbA1c = 8.1 - Continue Jardiance.   5. PVD - S/p kissing stents bilateral iliac arteries extending into aorta - Hx gangrene and left foot ulcers. Significant below knee small vessel disease. Follows with Dr. Sharol Given and uses nitroglycerin patches - Continue Plavix  6. COPD with ongoing tobacco use - smoking cessation discussed numerous times   7. Hypokalemia - Resolved.  - Continue to monitor with diuretics.  Length of Stay: [place], LINDSAY N PA-C  04/12/2021, 10:28 AM  Patient seen and examined with the above-signed Advanced Practice Provider and/or Housestaff. I personally reviewed laboratory data, imaging studies and relevant notes. I independently examined the patient and formulated the important aspects of the plan. I have edited the note to reflect any of my changes or salient points. I have personally discussed the plan with the patient and/or family.  Cough improved. Weight up about 2 pounds overnight but ReDS ok. Scr improved.   General:  Sitting up in bed  No resp difficulty HEENT: normal Neck: supple. JVP 7 Carotids 2+ bilat; no bruits. No lymphadenopathy or thryomegaly appreciated. Cor: PMI nondisplaced. Regular  rate & rhythm. No rubs, gallops or murmurs. Lungs: decreased throughout Abdomen: soft, nontender, nondistended. No hepatosplenomegaly. No bruits or masses. Good bowel sounds. Extremities: no cyanosis, clubbing, rash, edema Neuro: alert & orientedx3, cranial nerves grossly intact. moves all 4 extremities w/o difficulty. Affect pleasant  Agree with restarting home lasix. Ok for d/c from my standpoint later today or tomorrow.   Glori Bickers, MD  12:56 PM

## 2021-04-12 NOTE — Progress Notes (Signed)
SATURATION QUALIFICATIONS: (This note is used to comply with regulatory documentation for home oxygen)  Patient Saturations on Room Air at Rest = 96%  Patient Saturations on Room Air while Ambulating = 87 %  Patient Saturations on 2  Liters of oxygen while Ambulating = 100 %  Please briefly explain why patient needs home oxygen: Pt SOB when ambulating, needed to take a rest break during ambulation. Saturations dropped to 87%.

## 2021-04-12 NOTE — Progress Notes (Signed)
Inpatient Diabetes Program Recommendations  AACE/ADA: New Consensus Statement on Inpatient Glycemic Control (2015)  Target Ranges:  Prepandial:   less than 140 mg/dL      Peak postprandial:   less than 180 mg/dL (1-2 hours)      Critically ill patients:  140 - 180 mg/dL    Results for Hannah French, Hannah French" (MRN [id number]) as of 04/12/2021 10:33  Ref. Range 04/11/2021 07:34 04/11/2021 11:51 04/11/2021 16:21 04/11/2021 21:17  Glucose-Capillary Latest Ref Range: 70 - 99 mg/dL 246 (H)  3 units NOVOLOG  226 (H)  4 units NOVOLOG  5 units Lantus  214 (H)  5 units NOVOLOG  232 (H)  Results for Hannah French, Hannah French" (MRN [id number]) as of 04/12/2021 10:33  Ref. Range 04/12/2021 07:32  Glucose-Capillary Latest Ref Range: 70 - 99 mg/dL 260 (H)  7  units NOVOLOG     Home DM Meds: Tresiba 10 units daily      Novolog 5-20 units TID with meals    Amaryl 2-4 mg daily  Current Orders: Levemir 5 units Daily    Novolog 0-9 units TID  Novolog 2 units TID with meals Jardiance 10 mg Daily    MD- Note AM CBG 260 this AM.  Afternoon CBGs yesterday elevated as well.  Please consider:  1. Increase Levemir slightly to 7 units Daily (note patient had significant hypoglycemia after getting 10 units Lantus on 7/17)  2. Increase Novolog Meal Coverage slightly to 3 units TID with meals    --Will follow patient during hospitalization--  Wyn Quaker RN, MSN, CDE Diabetes Coordinator Inpatient Glycemic Control Team Team Pager: [phone] (8a-5p)

## 2021-04-12 NOTE — Plan of Care (Signed)

## 2021-04-12 NOTE — Discharge Summary (Signed)
Physician Discharge Summary  Raizy Auzenne EXN:[id number] DOB: 1940/11/08 DOA: 04/06/2021  PCP: Dettinger, Fransisca Kaufmann, MD  Admit date: 04/06/2021 Discharge date: 04/12/2021  Time spent: 40 minutes  Recommendations for Outpatient Follow-up:  Follow outpatient CBC/CMP Follow up outpatient cardiology -- discuss CRT Follow up with pulmonology outpatient Discharge on o2 with activity, 2 L Decrease tresiba to 7 units, decreased SSI dose, stopped amaryl - follow outpatient BG's Follow renal function outpatient  Discharge Diagnoses:  Active Problems:   Acute respiratory failure ([place])   Respiratory failure ([place])   Discharge Condition: stable  Diet recommendation: heart healthy, low sodium  Filed Weights   04/10/21 0509 04/11/21 0442 04/12/21 0557  Weight: 54.5 kg 51.7 kg 52.8 kg    History of present illness:  80 yo F with hx systolic and diastolic HF, tobacco abuse, CKD, T2DM, CAD, COPD (not on home O2) and multiple other medical problems presenting on 7/13 with  worsening shortness of breath and confusion.  She'd been started on steroids for suspected COPD exacerbation by her PCP prior to admission and told to increase her diuretics for concern for volume overload (6-8 lb weight gain prior to admission per H&P).  She required intubation and care was transferred to [facility].  Cardiology was c/s as well for her heart failure exacerbation.  She was extubated on 7/14.  Transferred to [facility] service on 7/17.  She was diuresed and improved to her baseline.  She was discharged on 7/19 in stable condition.  Started on oxygen.  Hospital Course:  Acute Hypoxic Respiratory Failure 2/2 COPD and HF exacerbation Treatment as noted below Wean for O2 sat >49%   Acute Systolic and Diastolic HF Exacerbation Echo 7/13 with EF 25-30%, severe septal hypokinesis and basal to mid inferior akinesis, septal lateral dyssynchrony c/w LBBB, left ventricular diastolic parameters c/w grade 2 diastolic dysfunction  (see report) CRT outpatient Strict I/O, daily weights Continue meds per cards - resume lasix, continue jardiance - hold beta blocker for now - consider CRT outpatient    Acute COPD Exacerbation S/p abx with azithromycin x5 days (received 2 days of ceftriaxone, cefepime x1 day and aztreonam x1 day) CXR 7/14 with slight improvement in mildly prominent interstitial markings Methylpred 7/13-7/15 Continue nebs and inhalers O2 sat decreased to 87 with ambulation -> start on 2 L with activity    T2DM  Hypoglycemia Adjust, follow closely - basal/bolus Follow closely Continue jardiance   AKI on CKD stage IIIa Baseline creatinine in may appears to have been 1.1-1.5 Creatinine is 1.73 today, improving Follow with diuresis Follow renal US UA at presentation appears bland   Hx PVD Nitroglycerin patch to LLE   Tobacco Abuse   GERD PPI  Procedures: Echo IMPRESSIONS     1. Left ventricular ejection fraction, by estimation, is 25 to 30%. The  left ventricle has severely decreased function. The left ventricle  demonstrates regional wall motion abnormalities with severe septal  hypokinesis and basal to mid inferior akinesis.   There is septal-lateral dyssynchrony consistent with LBBB. Left  ventricular diastolic parameters are consistent with Grade II diastolic  dysfunction (pseudonormalization).   2. Right ventricular systolic function is mildly reduced. The right  ventricular size is normal. Tricuspid regurgitation signal is inadequate  for assessing PA pressure.   3. Left atrial size was mildly dilated.   4. The mitral valve is normal in structure. Trivial mitral valve  regurgitation. No evidence of mitral stenosis.   5. The aortic valve is tricuspid. Aortic valve regurgitation  is not  visualized. Mild aortic valve sclerosis is present, with no evidence of  aortic valve stenosis.   6. The inferior vena cava is normal in size with <50% respiratory  variability, suggesting right  atrial pressure of 8 mmHg.   Consultations: Cardiology PCCM  Discharge Exam: Vitals:   04/12/21 0557 04/12/21 0734  BP: (!) 114/54 (!) 103/59  Pulse:  (!) 105  Resp:  16  Temp: 97.7 F (36.5 C) 97.6 F (36.4 C)  SpO2: 97% 91%   No new complaints Discussed with son  General: No acute distress. Cardiovascular: Heart sounds show Devynn Hessler regular rate, and rhythm. Lungs: Clear to auscultation bilaterally Abdomen: Soft, nontender, nondistended Neurological: Alert and oriented 3. Moves all extremities 4 . Cranial nerves II through XII grossly intact. Skin: Warm and dry. No rashes or lesions. Extremities: No clubbing or cyanosis. No edema.  Discharge Instructions   Discharge Instructions     (HEART FAILURE PATIENTS) Call MD:  Anytime you have any of the following symptoms: 1) 3 pound weight gain in 24 hours or 5 pounds in 1 week 2) shortness of breath, with or without Jasiya Markie dry hacking cough 3) swelling in the hands, feet or stomach 4) if you have to sleep on extra pillows at night in order to breathe.   Complete by: As directed    Call MD for:  difficulty breathing, headache or visual disturbances   Complete by: As directed    Call MD for:  extreme fatigue   Complete by: As directed    Call MD for:  hives   Complete by: As directed    Call MD for:  persistant dizziness or light-headedness   Complete by: As directed    Call MD for:  persistant nausea and vomiting   Complete by: As directed    Call MD for:  redness, tenderness, or signs of infection (pain, swelling, redness, odor or green/yellow discharge around incision site)   Complete by: As directed    Call MD for:  severe uncontrolled pain   Complete by: As directed    Call MD for:  temperature >100.4   Complete by: As directed    Diet - low sodium heart healthy   Complete by: As directed    Discharge instructions   Complete by: As directed    You were seen for respiratory failure (shortness of breath) due to heart failure  and COPD exacerbation.  You've been treated with antibiotics and steroids in addition to diuretics.  Resume your home COPD medicines and follow up with your COPD doctor.  We've started you on oxygen to use with activity and at night when you sleep or as needed.    For your heart failure, stop you metoprolol for now.  Start jardiance.  Continue your lasix.  Stop your metolazone unless told otherwise by the heart failure clinic.  I've decreased your tresiba Girtha Kilgore little bit due to your low blood sugar the other day.  You've also been started on jardiance for your heart failure, which should help as well.  Amaryl can tend to cause low blood sugars so stop this.  I've adjusted your sliding scale insulin to what we were giving you in the hospital.  Please follow up your blood sugars and your regimen with your outpatient provider.  Return for new, recurrent, or worsening symptoms.  Please ask your PCP to request records from this hospitalization so they know what was done and what the next steps will be.  Your  new sliding scale for your aspart (novolog) is below: CBG 70 - 120: 0 units  CBG 121 - 150: 1 unit  CBG 151 - 200: 2 units  CBG 201 - 250: 3 units  CBG 251 - 300: 5 units  CBG 301 - 350: 7 units  CBG 351 - 400: 9 units  CBG > 400: give 9 units and call your primary care provider for additional instructions   Increase activity slowly   Complete by: As directed       Allergies as of 04/12/2021       Reactions   Contrast Media [iodinated Diagnostic Agents] Shortness Of Breath   Iohexol Other (See Comments)   PASSED OUT DURING THE TEST   Metformin Swelling   Face and hands became swollen   Statins Swelling, Other (See Comments)   They make the patient hurt "all over" Myalgia, also   Bextra [valdecoxib] Nausea And Vomiting, Swelling   Shut down my kidneys    Cefdinir Other (See Comments)   Pancreatitis   Cozaar [losartan Potassium] Other (See Comments)   dizziness   Gabapentin     Nsaids Other (See Comments)   GI intolerance and pain all over, tolerates ibu   Zetia [ezetimibe] Swelling   Rofecoxib Other (See Comments)   "VIOXX"- "SHUT DOWN MY KIDNEYS"   Sulfa Antibiotics Nausea And Vomiting, Other (See Comments)   Pancreatitis        Medication List     STOP taking these medications    glimepiride 2 MG tablet Commonly known as: AMARYL   metolazone 2.5 MG tablet Commonly known as: ZAROXOLYN   metoprolol succinate 25 MG 24 hr tablet Commonly known as: TOPROL-XL   predniSONE 20 MG tablet Commonly known as: DELTASONE       TAKE these medications    acetaminophen 500 MG tablet Commonly known as: TYLENOL Take 500 mg by mouth every 6 (six) hours as needed for moderate pain.   albuterol 108 (90 Base) MCG/ACT inhaler Commonly known as: VENTOLIN HFA Inhale 2 puffs into the lungs every 6 (six) hours as needed for wheezing or shortness of breath.   BLOOD GLUCOSE TEST STRIPS Strp 1 strip by In Vitro route 2 (two) times daily.   clopidogrel 75 MG tablet Commonly known as: Plavix Take 1 tablet (75 mg total) by mouth daily.   empagliflozin 10 MG Tabs tablet Commonly known as: JARDIANCE Take 1 tablet (10 mg total) by mouth daily. Start taking on: April 13, 2021   ferrous sulfate 325 (65 FE) MG tablet Take 1 tablet (325 mg total) by mouth daily with breakfast.   furosemide 20 MG tablet Commonly known as: LASIX Take 2 tablets daily. You may take an extra dose for increase weight gain orswelling   insulin aspart 100 UNIT/ML injection Commonly known as: novoLOG Inject 0-9 Units into the skin 3 (three) times daily with meals. Sliding Scale CBG 70 - 120: 0 units  CBG 121 - 150: 1 unit  CBG 151 - 200: 2 units  CBG 201 - 250: 3 units  CBG 251 - 300: 5 units  CBG 301 - 350: 7 units  CBG 351 - 400: 9 units  CBG > 400: give 9 units and call your MD for additional instructions What changed:  how much to take when to take this additional  instructions   levalbuterol 0.63 MG/3ML nebulizer solution Commonly known as: XOPENEX NEBULIZE 1 VIAL EVERY 4 HOURS AS NEEDED FOR WHEEZING OR SHORTNESS OF BREATH  nitroGLYCERIN 0.2 mg/hr patch Commonly known as: NITRODUR - Dosed in mg/24 hr Place 1 patch (0.2 mg total) onto the skin daily.   nitroGLYCERIN 0.3 MG SL tablet Commonly known as: Nitrostat Place 1 tablet (0.3 mg total) under the tongue every 5 (five) minutes as needed for chest pain.   Opcon-Elecia Serafin 0.027-0.315 % Soln Generic drug: Naphazoline-Pheniramine Place 1 drop into both eyes daily as needed (Burning and itching eyes).   pantoprazole 40 MG tablet Commonly known as: PROTONIX Take 1 tablet (40 mg total) by mouth 2 (two) times daily for 20 days.   pentoxifylline 400 MG CR tablet Commonly known as: TRENTAL TAKE  (1)  TABLET  THREE TIMES DAILY WITH MEALS.   potassium chloride SA 20 MEQ tablet Commonly known as: KLOR-CON Take 2 tablets (40 mEq total) by mouth as directed. Only when you take Metolazone   sennosides-docusate sodium 8.6-50 MG tablet Commonly known as: SENOKOT-S Take 1 tablet by mouth daily.   Trelegy Ellipta 100-62.5-25 MCG/INH Aepb Generic drug: Fluticasone-Umeclidin-Vilant Inhale 1 puff into the lungs daily.   Tyler Aas FlexTouch 100 UNIT/ML FlexTouch Pen Generic drug: insulin degludec Inject 7 Units into the skin daily. What changed: how much to take   Vitamin D 50 MCG (2000 UT) tablet Take 4,000 Units by mouth daily.               Durable Medical Equipment  (From admission, onward)           Start     Ordered   04/12/21 1541  For home use only DME oxygen  Once       Comments: SATURATION QUALIFICATIONS: (This note is used to comply with regulatory documentation for home oxygen)   Patient Saturations on Room Air at Rest = 96%   Patient Saturations on Room Air while Ambulating = 87 %   Patient Saturations on 2  Liters of oxygen while Ambulating = 100 %   Please briefly  explain why patient needs home oxygen: Pt SOB when ambulating, needed to take Breklyn Fabrizio rest break during ambulation. Saturations dropped to 87%.  Question Answer Comment  Length of Need Lifetime   Mode or (Route) Nasal cannula   Liters per Minute 2   Frequency Continuous (stationary and portable oxygen unit needed)   Oxygen conserving device Yes   Oxygen delivery system Gas      04/12/21 1540   04/12/21 0814  For home use only DME 4 wheeled rolling walker with seat  Once       Question:  Patient needs Chukwuemeka Artola walker to treat with the following condition  Answer:  Physical deconditioning   04/12/21 0813   04/12/21 0814  For home use only DME 3 n 1  Once        04/12/21 0813           Allergies  Allergen Reactions   Contrast Media [Iodinated Diagnostic Agents] Shortness Of Breath   Iohexol Other (See Comments)    PASSED OUT DURING THE TEST     Metformin Swelling    Face and hands became swollen   Statins Swelling and Other (See Comments)    They make the patient hurt "all over" Myalgia, also   Bextra [Valdecoxib] Nausea And Vomiting and Swelling    Shut down my kidneys    Cefdinir Other (See Comments)    Pancreatitis    Cozaar [Losartan Potassium] Other (See Comments)    dizziness   Gabapentin    Nsaids Other (See Comments)  GI intolerance and pain all over, tolerates ibu    Zetia [Ezetimibe] Swelling   Rofecoxib Other (See Comments)    "VIOXX"- "SHUT DOWN MY KIDNEYS"   Sulfa Antibiotics Nausea And Vomiting and Other (See Comments)    Pancreatitis    Follow-up Information     [facility] Follow up.   Specialty: Home Health Services Why: Registered Nurse and Physical Therapy-office to call with Sharlena Kristensen visit time. Contact information: [place] [address] [phone]         [facility] Follow up.   Why: Rollator- will be delivere to the room prior to transition home. Contact information: [place]  [address] [phone]                  The results of significant diagnostics from this hospitalization (including imaging, microbiology, ancillary and laboratory) are listed below for reference.    Significant Diagnostic Studies: DG Chest 1 View  Result Date: 04/06/2021 CLINICAL DATA:  80 year old female intubated. Shortness of breath and tachycardia. EXAM: CHEST  1 VIEW COMPARISON:  Portable chest 0322 hours today and earlier. FINDINGS: Portable AP supine view at 0404 hours. Intubated with endotracheal tube tip in good position between the clavicles and carina. Enteric tube courses to the abdomen and side hole is at the level of the gastric fundus. Mediastinal contours are stable and within normal limits. Coarse bilateral pulmonary interstitial opacity persists, progressed since 04/01/2021. No pneumothorax, pleural effusion or consolidation. Stable visualized osseous structures. IMPRESSION: 1. Endotracheal tube and enteric tube in good position. 2. Ongoing coarse bilateral pulmonary interstitial opacity, acute new since 04/01/2021, with differential considerations of viral/atypical pneumonia versus pulmonary edema. Electronically Signed   By: Genevie Ann M.D.   On: 04/06/2021 04:18   DG Chest 2 View  Result Date: 04/02/2021 CLINICAL DATA:  Cough and congestion.  COPD versus CHF. EXAM: CHEST - 2 VIEW COMPARISON:  Feb 19, 2021 FINDINGS: The heart size is borderline. The hila and mediastinum are unremarkable. No pneumothorax. No nodules or masses. Mild Kerley B lines identified at the bases, particularly on the left, not seen on the Feb 19, 2021 study. No other significant abnormalities. IMPRESSION: Nicholes Mango, new since Feb 19, 2021, consistent with very mild pulmonary edema. Electronically Signed   By: Dorise Bullion III M.D   On: 04/02/2021 15:17   US RENAL  Result Date: 04/10/2021 CLINICAL DATA:  Acute renal insufficiency, remote history of bladder cancer EXAM: RENAL / URINARY TRACT  ULTRASOUND COMPLETE COMPARISON:  01/14/2021 FINDINGS: Right Kidney: Renal measurements: 8.9 x 3.4 x 3.7 cm = volume: 58.4 mL. Echogenicity within normal limits. No mass or hydronephrosis visualized. Left Kidney: Renal measurements: 11.4 x 4.4 x 5.2 cm = volume: 135.3 mL. Echogenicity within normal limits. Lobular contour of the left kidney is again identified, with duplicated left renal collecting system as described on numerous previous CT exams. No masses or hydronephrosis visualized. Bladder: Appears normal for degree of bladder distention. Other: None. IMPRESSION: 1. 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 2. Otherwise unremarkable renal ultrasound. Electronically Signed   By: Randa Ngo M.D.   On: 04/10/2021 16:56   DG CHEST PORT 1 VIEW  Result Date: 04/07/2021 CLINICAL DATA:  ET tube EXAM: PORTABLE CHEST 1 VIEW COMPARISON:  None.  April 06, 2021. FINDINGS: Endotracheal tube tip is approximately 4.2 cm above the carina. Left IJ approach central  venous catheter with the tip projecting in the region of the superior cavoatrial junction, similar to prior. Gastric tube courses below the diaphragm with the tip outside the field of view. Similar cardiomediastinal silhouette. Aortic atherosclerosis. Suspected slight improvement in now mild prominent interstitial markings, similar to the prior. No new consolidation. No visible pleural effusions or pneumothorax on this single AP semierect radiograph. No visible pleural effusions or pneumothorax. No acute osseous abnormality. IMPRESSION: 1. Suspected slight improvement in mildly prominent interstitial markings. 2. Support devices as detailed above. Electronically Signed   By: Margaretha Sheffield MD   On: 04/07/2021 09:07   DG CHEST PORT 1 VIEW  Result Date: 04/06/2021 CLINICAL DATA:  Central line placement. EXAM: PORTABLE CHEST 1 VIEW COMPARISON:  April 06, 2021. FINDINGS: LEFT-sided IJ central  venous line terminates at the caval to atrial junction. Endotracheal tube terminates approximately 3.6 cm above the carina. Gastric tube with side port below GE junction, tip off the field of view. Cardiomediastinal contours and hilar structures are stable. Structure overlies the LEFT upper chest in there is also likely in associated skin fold in this area. No sign of pneumothorax. No effusion on frontal radiograph. Increased interstitial markings may be slightly improved compared with previous imaging accounting for differences in technique. On limited assessment no acute skeletal process. IMPRESSION: 1. LEFT IJ central venous line terminates at the caval to atrial junction. No sign of pneumothorax. 2. Additional support apparatus without change. 3. Potential improvement with respect to interstitial markings accounting for differences in technique, attention on follow-up. Electronically Signed   By: Zetta Bills M.D.   On: 04/06/2021 12:40   DG Chest Portable 1 View  Result Date: 04/06/2021 CLINICAL DATA:  Shortness of breath, confusion, tachycardia EXAM: PORTABLE CHEST 1 VIEW COMPARISON:  Radiograph 04/01/2021 FINDINGS: Markedly increased coarse reticulonodular opacities are present throughout both lungs with indistinct pulmonary vascularity. No pneumothorax or visible effusion. No focal consolidative process is seen. Stable cardiomediastinal contours with Deshawn Skelley calcified aorta. No acute osseous or soft tissue abnormality. IMPRESSION: Interval development of diffuse heterogeneous reticular opacities throughout the lungs with peripheral septal thickening. Rapid development favoring pulmonary edema versus atypical infection/interstitial inflammation. Aortic Atherosclerosis (ICD10-I70.0). Electronically Signed   By: Lovena Le M.D.   On: 04/06/2021 03:31   ECHOCARDIOGRAM COMPLETE  Result Date: 04/06/2021    ECHOCARDIOGRAM REPORT   Patient Name:   RIELLE SCHLAUCH Date of Exam: 04/06/2021 Medical Rec #:   [id number]              Height:       59.0 in Accession #:    [id number]             Weight:       112.4 lb Date of Birth:  [birth year]              BSA:          1.444 m Patient Age:    80 years               BP:           88/48 mmHg Patient Gender: F                      HR:           85 bpm. Exam Location:  Inpatient Procedure: 2D Echo, Cardiac Doppler and Color Doppler Indications:    CHF  History:        Patient has prior  history of Echocardiogram examinations, most                 recent 10/21/2020. CHF and Cardiomyopathy, CAD, PAD and COPD,                 Signs/Symptoms:Shortness of Breath and CKD; Risk Factors:Current                 Smoker, Diabetes and Hypertension.  Sonographer:    Dustin Flock Referring Phys: [id number] Chilton D Dane  1. Left ventricular ejection fraction, by estimation, is 25 to 30%. The left ventricle has severely decreased function. The left ventricle demonstrates regional wall motion abnormalities with severe septal hypokinesis and basal to mid inferior akinesis.  There is septal-lateral dyssynchrony consistent with LBBB. Left ventricular diastolic parameters are consistent with Grade II diastolic dysfunction (pseudonormalization).  2. Right ventricular systolic function is mildly reduced. The right ventricular size is normal. Tricuspid regurgitation signal is inadequate for assessing PA pressure.  3. Left atrial size was mildly dilated.  4. The mitral valve is normal in structure. Trivial mitral valve regurgitation. No evidence of mitral stenosis.  5. The aortic valve is tricuspid. Aortic valve regurgitation is not visualized. Mild aortic valve sclerosis is present, with no evidence of aortic valve stenosis.  6. The inferior vena cava is normal in size with <50% respiratory variability, suggesting right atrial pressure of 8 mmHg. FINDINGS  Left Ventricle: Left ventricular ejection fraction, by estimation, is 25 to 30%. The left ventricle has severely decreased function.  The left ventricle demonstrates regional wall motion abnormalities. The left ventricular internal cavity size was normal  in size. There is no left ventricular hypertrophy. Left ventricular diastolic parameters are consistent with Grade II diastolic dysfunction (pseudonormalization). Right Ventricle: The right ventricular size is normal. No increase in right ventricular wall thickness. Right ventricular systolic function is mildly reduced. Tricuspid regurgitation signal is inadequate for assessing PA pressure. Left Atrium: Left atrial size was mildly dilated. Right Atrium: Right atrial size was normal in size. Pericardium: There is no evidence of pericardial effusion. Mitral Valve: The mitral valve is normal in structure. Mild mitral annular calcification. Trivial mitral valve regurgitation. No evidence of mitral valve stenosis. Tricuspid Valve: The tricuspid valve is normal in structure. Tricuspid valve regurgitation is trivial. Aortic Valve: The aortic valve is tricuspid. Aortic valve regurgitation is not visualized. Mild aortic valve sclerosis is present, with no evidence of aortic valve stenosis. Aortic valve peak gradient measures 8.6 mmHg. Pulmonic Valve: The pulmonic valve was normal in structure. Pulmonic valve regurgitation is not visualized. Aorta: The aortic root is normal in size and structure. Venous: The inferior vena cava is normal in size with less than 50% respiratory variability, suggesting right atrial pressure of 8 mmHg. IAS/Shunts: No atrial level shunt detected by color flow Doppler.  LEFT VENTRICLE PLAX 2D LVIDd:         5.00 cm  Diastology LVIDs:         3.80 cm  LV e' medial:    5.22 cm/s LV PW:         1.10 cm  LV E/e' medial:  16.5 LV IVS:        1.10 cm  LV e' lateral:   5.33 cm/s LVOT diam:     1.90 cm  LV E/e' lateral: 16.2 LV SV:         72 LV SV Index:   50 LVOT Area:     2.84 cm  RIGHT VENTRICLE RV  Basal diam:  2.70 cm RV S prime:     7.18 cm/s TAPSE (M-mode): 1.3 cm LEFT ATRIUM              Index       RIGHT ATRIUM           Index LA diam:        3.50 cm 2.42 cm/m  RA Area:     11.60 cm LA Vol (A2C):   41.7 ml 28.88 ml/m RA Volume:   25.50 ml  17.66 ml/m LA Vol (A4C):   40.1 ml 27.77 ml/m LA Biplane Vol: 42.8 ml 29.64 ml/m  AORTIC VALVE AV Area (Vmax): 2.14 cm AV Vmax:        147.00 cm/s AV Peak Grad:   8.6 mmHg LVOT Vmax:      111.00 cm/s LVOT Vmean:     73.600 cm/s LVOT VTI:       0.253 m  AORTA Ao Root diam: 2.40 cm MITRAL VALVE MV Area (PHT): 5.97 cm    SHUNTS MV Decel Time: 127 msec    Systemic VTI:  0.25 m MV E velocity: 86.30 cm/s  Systemic Diam: 1.90 cm MV Casey Fye velocity: 79.60 cm/s MV E/Vint Pola ratio:  1.08 Loralie Champagne MD Electronically signed by Loralie Champagne MD Signature Date/Time: 04/06/2021/4:22:38 PM    Final     Microbiology: Recent Results (from the past 240 hour(s))  Resp Panel by RT-PCR (Flu Lavonya Hoerner&B, Covid) Nasopharyngeal Swab     Status: None   Collection Time: 04/06/21  3:14 AM   Specimen: Nasopharyngeal Swab; Nasopharyngeal(NP) swabs in vial transport medium  Result Value Ref Range Status   SARS Coronavirus 2 by RT PCR NEGATIVE NEGATIVE Final    Comment: (NOTE) SARS-CoV-2 target nucleic acids are NOT DETECTED.  The SARS-CoV-2 RNA is generally detectable in upper respiratory specimens during the acute phase of infection. The lowest concentration of SARS-CoV-2 viral copies this assay can detect is 138 copies/mL. Emmaleah Meroney negative result does not preclude SARS-Cov-2 infection and should not be used as the sole basis for treatment or other patient management decisions. Montavis Schubring negative result may occur with  improper specimen collection/handling, submission of specimen other than nasopharyngeal swab, presence of viral mutation(s) within the areas targeted by this assay, and inadequate number of viral copies(<138 copies/mL). Dacen Frayre negative result must be combined with clinical observations, patient history, and epidemiological information. The expected result is  Negative.  Fact Sheet for Patients:  EntrepreneurPulse.com.au  Fact Sheet for Healthcare Providers:  IncredibleEmployment.be  This test is no t yet approved or cleared by the [place] FDA and  has been authorized for detection and/or diagnosis of SARS-CoV-2 by FDA under an Emergency Use Authorization (EUA). This EUA will remain  in effect (meaning this test can be used) for the duration of the COVID-19 declaration under Section 564(b)(1) of the Act, 21 U.S.C.section 360bbb-3(b)(1), unless the authorization is terminated  or revoked sooner.       Influenza Arielis Leonhart by PCR NEGATIVE NEGATIVE Final   Influenza B by PCR NEGATIVE NEGATIVE Final    Comment: (NOTE) The Xpert Xpress SARS-CoV-2/FLU/RSV plus assay is intended as an aid in the diagnosis of influenza from Nasopharyngeal swab specimens and should not be used as Arryn Terrones sole basis for treatment. Nasal washings and aspirates are unacceptable for Xpert Xpress SARS-CoV-2/FLU/RSV testing.  Fact Sheet for Patients: EntrepreneurPulse.com.au  Fact Sheet for Healthcare Providers: IncredibleEmployment.be  This test is not yet approved or cleared by the [place] and  has been authorized for detection and/or diagnosis of SARS-CoV-2 by FDA under an Emergency Use Authorization (EUA). This EUA will remain in effect (meaning this test can be used) for the duration of the COVID-19 declaration under Section 564(b)(1) of the Act, 21 U.S.C. section 360bbb-3(b)(1), unless the authorization is terminated or revoked.  Performed at [facility], [address], [place], [place] [address]   Blood culture (routine x 2)     Status: None   Collection Time: 04/06/21  3:48 AM   Specimen: BLOOD RIGHT FOREARM  Result Value Ref Range Status   Specimen Description BLOOD RIGHT FOREARM  Final   Special Requests   Final    BOTTLES DRAWN AEROBIC AND ANAEROBIC Blood Culture adequate  volume   Culture   Final    NO GROWTH 5 DAYS Performed at [facility], [address] [place], [place] [address]    Report Status 04/11/2021 FINAL  Final  Blood culture (routine x 2)     Status: None   Collection Time: 04/06/21  3:50 AM   Specimen: BLOOD LEFT FOREARM  Result Value Ref Range Status   Specimen Description BLOOD LEFT FOREARM  Final   Special Requests   Final    BOTTLES DRAWN AEROBIC AND ANAEROBIC Blood Culture adequate volume   Culture   Final    NO GROWTH 5 DAYS Performed at [facility], [address], [place], [place] [address]    Report Status 04/11/2021 FINAL  Final  MRSA Next Gen by PCR, Nasal     Status: None   Collection Time: 04/06/21 11:37 AM   Specimen: Nasal Swab  Result Value Ref Range Status   MRSA by PCR Next Gen NOT DETECTED NOT DETECTED Final    Comment: (NOTE) The GeneXpert MRSA Assay (FDA approved for NASAL specimens only), is one component of [place] comprehensive MRSA colonization surveillance program. It is not intended to diagnose MRSA infection nor to guide or monitor treatment for MRSA infections. Test performance is not FDA approved in patients less than 54 years old. Performed at [facility], [place] [address], [place], [place] [address]   Urine culture     Status: None   Collection Time: 04/06/21  1:04 PM   Specimen: Urine, Random  Result Value Ref Range Status   Specimen Description URINE, RANDOM  Final   Special Requests NONE  Final   Culture   Final    NO GROWTH Performed at [facility], [place] [address], [place], [place] [address]    Report Status 04/07/2021 FINAL  Final  Culture, Respiratory w Gram Stain     Status: None   Collection Time: 04/06/21  3:59 PM   Specimen: Tracheal Aspirate; Respiratory  Result Value Ref Range Status   Specimen Description TRACHEAL ASPIRATE  Final   Special Requests NONE  Final   Gram Stain NO WBC SEEN NO ORGANISMS SEEN   Final   Culture   Final    RARE Normal respiratory flora-no  Staph aureus or Pseudomonas seen Performed at [facility], [address], [place],  [address]    Report Status 04/09/2021 FINAL  Final     Labs: Basic Metabolic Panel: Recent Labs  Lab 04/06/21 1304 04/07/21 0445 04/07/21 0712 04/08/21 0530 04/09/21 0510 04/10/21 0110 04/11/21 0045 04/12/21 0120  NA 140 138   < > 137 139 138 139 139  K 3.7 5.1   < > 4.1 3.9 3.9 3.4* 4.6  CL 103 104  --  105 103 97* 94* 100  CO2 27 24  --  24 30 34* 35* 30  GLUCOSE 148* 223*  --  126* 95 196* 332* 181*  BUN 60* 65*  --  59* 63* 59* 55* 53*  CREATININE 2.38* 2.59*  --  2.27* 2.28* 2.03* 1.87* 1.73*  CALCIUM 9.0 8.7*  --  8.4* 8.9 8.8* 8.9 9.1  MG 2.4 2.4  --   --   --   --  2.0 2.2  PHOS 4.2  --   --   --   --   --  4.4 3.6   < > = values in this interval not displayed.   Liver Function Tests: Recent Labs  Lab 04/06/21 1304 04/07/21 0445 04/08/21 0530 04/11/21 0045 04/12/21 0120  AST 53* 27 19 15 15   ALT 68* 51* 37 26 21  ALKPHOS 42 38 35* 35* 36*  BILITOT 0.8 1.3* 0.6 1.0 1.2  PROT 5.8* 5.6* 5.2* 5.7* 6.2*  ALBUMIN 3.6 3.1* 2.9* 3.2* 3.4*   Recent Labs  Lab 04/06/21 1304  LIPASE 29  AMYLASE 57   No results for input(s): AMMONIA in the last 168 hours. CBC: Recent Labs  Lab 04/06/21 0347 04/06/21 0857 04/07/21 0445 04/07/21 0712 04/08/21 0530 04/09/21 0510 04/11/21 0045 04/12/21 0120  WBC 16.1*   < > 14.8*  --  15.2* 10.9* 11.8* 12.6*  NEUTROABS 12.3*  --   --   --  13.6*  --  8.2* 8.9*  HGB 11.9*   < > 9.7* 10.5* 9.6* 9.7* 10.4* 10.6*  HCT 40.2   < > 31.8* 31.0* 31.7* 31.6* 33.8* 34.9*  MCV 107.2*   < > 103.2*  --  104.6* 102.6* 102.1* 102.0*  PLT 651*   < > 363  --  337 324 289 284   < > = values in this interval not displayed.   Cardiac Enzymes: No results for input(s): CKTOTAL, CKMB, CKMBINDEX, TROPONINI in the last 168 hours. BNP: BNP (last 3 results) Recent Labs    01/19/21 0522 02/19/21 1943 04/06/21 0347  BNP 288.0* 179.0* 918.0*     ProBNP (last 3 results) No results for input(s): PROBNP in the last 8760 hours.  CBG: Recent Labs  Lab 04/11/21 1621 04/11/21 2117 04/12/21 0124 04/12/21 0732 04/12/21 1155  GLUCAP 214* 232* 196* 260* 187*       Signed:  Fayrene Helper MD.  [facility] 04/12/2021, 4:56 PM

## 2021-04-12 NOTE — Consult Note (Addendum)
   [facility] CM Inpatient Consult   04/12/2021  Hannah French 08-15-1941 [id number]  [facility] [ACO] Patient: [name]  Primary Care Provider:  Dettinger, Fransisca Kaufmann, MD [facility]  Patient screened for hospitalization with noted extreme high risk score for unplanned readmission risk and to assess for potential [place] Management service needs for post hospital transition.  Review of patient's medical record reveals patient has been outreached by the Embedded Chronic Care Management nurse. Spoke with the patient by bedside phone however she was unable to hear well, left a message on her cell phone she requested.  4:45 pm - patient called back, HIPPA verified.  She was waiting on oxygen for home and wanted to speak with TOC team.  [facility] RN regarding request of patient.  Plan:  Will alert [facility] Embedded RN of transitioning home with DME.  For questions contact:   Natividad Brood, RN BSN [facility] Liaison  [phone] business mobile phone Toll free office [phone]  Fax number: [phone] [email or web address]

## 2021-04-13 ENCOUNTER — Telehealth: Payer: Self-pay

## 2021-04-13 NOTE — Telephone Encounter (Signed)
Transition Care Management Follow-up Telephone Call Date of discharge and from where: 04/12/21 [facility] Diagnosis: Acute Respiratory Failure How have you been since you were released from the hospital? The same - weak and tired. Any questions or concerns? No  Items Reviewed: Did the pt receive and understand the discharge instructions provided? Yes  Medications obtained and verified? Yes  Other? No  Any new allergies since your discharge? No  Dietary orders reviewed? Yes Do you have support at home? Yes   Home Care and Equipment/Supplies: Were home health services ordered? yes If so, what is the name of the agency? [facility]  Has the agency set up a time to come to the patient's home? no Were any new equipment or medical supplies ordered?  Yes: 3 in 1, 4 wheel rolling walker, portable oxygen What is the name of the medical supply agency? [facility] Were you able to get the supplies/equipment? yes Do you have any questions related to the use of the equipment or supplies? No  Functional Questionnaire: (I = Independent and D = Dependent) ADLs: I  Bathing/Dressing- I  Meal Prep- I  Eating- I  Maintaining continence- I  Transferring/Ambulation- I  Managing Meds- I  Follow up appointments reviewed:  PCP Hospital f/u appt confirmed? Yes  Scheduled to see On[facility] on 7/26  @ 11. [facility] f/u appt confirmed? Yes  Scheduled to see pulmolology -[facility] on 9/12 and cardiology - Quentin Ore on 9/20 Are transportation arrangements needed? No  If their condition worsens, is the pt aware to call PCP or go to the Emergency Dept.? Yes Was the patient provided with contact information for the PCP's office or ED? Yes Was to pt encouraged to call back with questions or concerns? Yes

## 2021-04-14 ENCOUNTER — Telehealth: Payer: Self-pay | Admitting: Family Medicine

## 2021-04-14 NOTE — Telephone Encounter (Signed)
  Prescription Request  04/14/2021  What is the name of the medication or equipment? Needs order for oxygen  Have you contacted your pharmacy to request a refill? (if applicable) na  Which pharmacy would you like this sent to? [facility]   Patient notified that their request is being sent to the clinical staff for review and that they should receive a response within 2 business days.

## 2021-04-14 NOTE — Telephone Encounter (Signed)
Pt came home from hospital with O2 from [facility] oxygen but they are heavy tanks that she is unable to maneuver around the house with. She would like an order for a POC to go to [facility] who she uses for her nebulizer. She needs to have a F2F visit with O2 testing. Moved her appt up with Je.

## 2021-04-14 NOTE — Telephone Encounter (Signed)
Hannah French aware that we are unable to change [facility] agency that was ordered from hospital, this will have to be reordered during a F2F visit with Je at her appt on Monday unless we can get her an appt on Friday

## 2021-04-14 NOTE — Telephone Encounter (Signed)
Pt was set up with [facility] but they do not service [place] Needs for RN oxygen and meds Home health aid for bathing 3x weekly  Needs home health asap

## 2021-04-15 ENCOUNTER — Telehealth: Payer: Self-pay

## 2021-04-15 ENCOUNTER — Telehealth: Payer: Self-pay | Admitting: Family Medicine

## 2021-04-15 ENCOUNTER — Telehealth: Payer: Self-pay | Admitting: Pharmacist

## 2021-04-15 ENCOUNTER — Ambulatory Visit (INDEPENDENT_AMBULATORY_CARE_PROVIDER_SITE_OTHER): Payer: Medicare Other | Admitting: Family Medicine

## 2021-04-15 ENCOUNTER — Other Ambulatory Visit: Payer: Self-pay

## 2021-04-15 ENCOUNTER — Encounter: Payer: Self-pay | Admitting: Family Medicine

## 2021-04-15 ENCOUNTER — Telehealth (HOSPITAL_COMMUNITY): Payer: Self-pay | Admitting: *Deleted

## 2021-04-15 VITALS — BP 106/70 | HR 107 | Temp 98.1°F

## 2021-04-15 DIAGNOSIS — R5381 Other malaise: Secondary | ICD-10-CM | POA: Diagnosis not present

## 2021-04-15 DIAGNOSIS — J449 Chronic obstructive pulmonary disease, unspecified: Secondary | ICD-10-CM | POA: Diagnosis not present

## 2021-04-15 DIAGNOSIS — E1165 Type 2 diabetes mellitus with hyperglycemia: Secondary | ICD-10-CM | POA: Diagnosis not present

## 2021-04-15 DIAGNOSIS — I5042 Chronic combined systolic (congestive) and diastolic (congestive) heart failure: Secondary | ICD-10-CM

## 2021-04-15 LAB — CMP14+EGFR
ALT: 25 IU/L (ref 0–32)
AST: 21 IU/L (ref 0–40)
Albumin/Globulin Ratio: 1.8 (ref 1.2–2.2)
Albumin: 4.2 g/dL (ref 3.7–4.7)
Alkaline Phosphatase: 60 IU/L (ref 44–121)
BUN/Creatinine Ratio: 26 (ref 12–28)
BUN: 44 mg/dL — ABNORMAL HIGH (ref 8–27)
Bilirubin Total: 0.7 mg/dL (ref 0.0–1.2)
CO2: 23 mmol/L (ref 20–29)
Calcium: 9.9 mg/dL (ref 8.7–10.3)
Chloride: 99 mmol/L (ref 96–106)
Creatinine, Ser: 1.68 mg/dL — ABNORMAL HIGH (ref 0.57–1.00)
Globulin, Total: 2.3 g/dL (ref 1.5–4.5)
Glucose: 275 mg/dL — ABNORMAL HIGH (ref 65–99)
Potassium: 4.6 mmol/L (ref 3.5–5.2)
Sodium: 138 mmol/L (ref 134–144)
Total Protein: 6.5 g/dL (ref 6.0–8.5)
eGFR: 31 mL/min/{1.73_m2} — ABNORMAL LOW (ref >59)

## 2021-04-15 LAB — CBC WITH DIFFERENTIAL/PLATELET
Basophils Absolute: 0.1 10*3/uL (ref 0.0–0.2)
Basos: 1 %
EOS (ABSOLUTE): 0.2 10*3/uL (ref 0.0–0.4)
Eos: 1 %
Hematocrit: 34.6 % (ref 34.0–46.6)
Hemoglobin: 11 g/dL — ABNORMAL LOW (ref 11.1–15.9)
Immature Grans (Abs): 0.2 10*3/uL — ABNORMAL HIGH (ref 0.0–0.1)
Immature Granulocytes: 2 %
Lymphocytes Absolute: 1.2 10*3/uL (ref 0.7–3.1)
Lymphs: 11 %
MCH: 30.1 pg (ref 26.6–33.0)
MCHC: 31.8 g/dL (ref 31.5–35.7)
MCV: 95 fL (ref 79–97)
Monocytes Absolute: 0.9 10*3/uL (ref 0.1–0.9)
Monocytes: 8 %
Neutrophils Absolute: 8.8 10*3/uL — ABNORMAL HIGH (ref 1.4–7.0)
Neutrophils: 77 %
Platelets: 293 10*3/uL (ref 150–450)
RBC: 3.66 x10E6/uL — ABNORMAL LOW (ref 3.77–5.28)
RDW: 21 % — ABNORMAL HIGH (ref 11.7–15.4)
WBC: 11.3 10*3/uL — ABNORMAL HIGH (ref 3.4–10.8)

## 2021-04-15 NOTE — Progress Notes (Signed)
Assessment & Plan:  1. Chronic combined systolic and diastolic CHF (congestive heart failure) ([place]) Keep follow-up with cardiology. - CBC with Differential/Platelet - CMP14+EGFR - Ambulatory referral to Home Health  2. Stage 3 severe COPD by GOLD classification ([place]) Patient does not qualify for oxygen. Overnight oximetry ordered through [facility].  - Ambulatory referral to Home Health  3. Physical deconditioning Referral placed to [facility] for PT, RN, and an aide to assist with bathing. - Ambulatory referral to [facility]   Follow up plan: Return for follow-up of chronic medication conditions with PCP when he has availability.  Hendricks Limes, MSN, APRN, FNP-C [facility]  Subjective:   Patient ID: Hannah French, female    DOB: [birth year], 80 y.o.   MRN: [id number]  HPI: Hannah French is a 80 y.o. female presenting on 04/15/2021 for No chief complaint on file.  Patient is accompanied by her daughter, who she is okay with being present.   Patient was hospitalized at [facility] 04/06/2021-04/12/2021 due to acute respiratory failure secondary to COPD and heart failure exacerbation.  She was intubated and then extubated on 04/07/2021.  She was diuresed and improved to her baseline.  She was discharged in stable condition.  Oxygen was ordered in the hospital.  Patient was advised to follow-up with cardiology as an outpatient to discuss CRT (appointment scheduled for 05/10/2021).  She was also advised to follow-up with pulmonology (appointment scheduled for 05/02/2021).  [facility] health was ordered through [facility], but apparently they are unable to service her, so she needs a new referral.  Oxygen was ordered through [facility].  The tanks are too heavy for the patient and she is unable to maneuver around the house.  She is requesting an order for a portable oxygen concentrator, that she would like to go to [facility] as that is who she uses for her  nebulizer.   ROS: Negative unless specifically indicated above in HPI.   Relevant past medical history reviewed and updated as indicated.   Allergies and medications reviewed and updated.   Current Outpatient Medications:    acetaminophen (TYLENOL) 500 MG tablet, Take 500 mg by mouth every 6 (six) hours as needed for moderate pain., Disp: , Rfl:    albuterol (VENTOLIN HFA) 108 (90 Base) MCG/ACT inhaler, Inhale 2 puffs into the lungs every 6 (six) hours as needed for wheezing or shortness of breath., Disp: 18 g, Rfl: 5   Cholecalciferol (VITAMIN D) 50 MCG (2000 UT) tablet, Take 4,000 Units by mouth daily. , Disp: , Rfl:    clopidogrel (PLAVIX) 75 MG tablet, Take 1 tablet (75 mg total) by mouth daily., Disp: 90 tablet, Rfl: 2   empagliflozin (JARDIANCE) 10 MG TABS tablet, Take 1 tablet (10 mg total) by mouth daily., Disp: 30 tablet, Rfl: 0   ferrous sulfate 325 (65 FE) MG tablet, Take 1 tablet (325 mg total) by mouth daily with breakfast., Disp: 90 tablet, Rfl: 1   Fluticasone-Umeclidin-Vilant (TRELEGY ELLIPTA) 100-62.5-25 MCG/INH AEPB, Inhale 1 puff into the lungs daily., Disp: 60 each, Rfl: 5   furosemide (LASIX) 20 MG tablet, Take 2 tablets daily. You may take an extra dose for increase weight gain orswelling, Disp: 180 tablet, Rfl: 3   Glucose Blood (BLOOD GLUCOSE TEST STRIPS) STRP, 1 strip by In Vitro route 2 (two) times daily., Disp: 100 strip, Rfl: 3   insulin aspart (NOVOLOG) 100 UNIT/ML injection, Inject 0-9 Units into the skin 3 (three) times daily with meals. Sliding  Scale CBG 70 - 120: 0 units  CBG 121 - 150: 1 unit  CBG 151 - 200: 2 units  CBG 201 - 250: 3 units  CBG 251 - 300: 5 units  CBG 301 - 350: 7 units  CBG 351 - 400: 9 units  CBG > 400: give 9 units and call your MD for additional instructions, Disp: 10 mL, Rfl: 1   insulin degludec (TRESIBA FLEXTOUCH) 100 UNIT/ML FlexTouch Pen, Inject 7 Units into the skin daily., Disp: 9 mL, Rfl: 3   levalbuterol (XOPENEX) 0.63 MG/3ML  nebulizer solution, NEBULIZE 1 VIAL EVERY 4 HOURS AS NEEDED FOR WHEEZING OR SHORTNESS OF BREATH, Disp: 75 mL, Rfl: 11   Naphazoline-Pheniramine (OPCON-A) 0.027-0.315 % SOLN, Place 1 drop into both eyes daily as needed (Burning and itching eyes)., Disp: , Rfl:    nitroGLYCERIN (NITRODUR - DOSED IN MG/24 HR) 0.2 mg/hr patch, Place 1 patch (0.2 mg total) onto the skin daily., Disp: 30 patch, Rfl: 12   nitroGLYCERIN (NITROSTAT) 0.3 MG SL tablet, Place 1 tablet (0.3 mg total) under the tongue every 5 (five) minutes as needed for chest pain., Disp: 100 tablet, Rfl: 3   pantoprazole (PROTONIX) 40 MG tablet, Take 1 tablet (40 mg total) by mouth 2 (two) times daily for 20 days., Disp: 40 tablet, Rfl: 1   pentoxifylline (TRENTAL) 400 MG CR tablet, TAKE  (1)  TABLET  THREE TIMES DAILY WITH MEALS., Disp: 90 tablet, Rfl: 3   potassium chloride SA (KLOR-CON) 20 MEQ tablet, Take 2 tablets (40 mEq total) by mouth as directed. Only when you take Metolazone, Disp: 10 tablet, Rfl: 0   sennosides-docusate sodium (SENOKOT-S) 8.6-50 MG tablet, Take 1 tablet by mouth daily., Disp: 30 tablet, Rfl: 5  Allergies  Allergen Reactions   Contrast Media [Iodinated Diagnostic Agents] Shortness Of Breath   Iohexol Other (See Comments)    PASSED OUT DURING THE TEST     Metformin Swelling    Face and hands became swollen   Statins Swelling and Other (See Comments)    They make the patient hurt "all over" Myalgia, also   Bextra [Valdecoxib] Nausea And Vomiting and Swelling    Shut down my kidneys    Cefdinir Other (See Comments)    Pancreatitis    Cozaar [Losartan Potassium] Other (See Comments)    dizziness   Gabapentin    Nsaids Other (See Comments)    GI intolerance and pain all over, tolerates ibu    Zetia [Ezetimibe] Swelling   Rofecoxib Other (See Comments)    "VIOXX"- "SHUT DOWN MY KIDNEYS"   Sulfa Antibiotics Nausea And Vomiting and Other (See Comments)    Pancreatitis    Objective:   BP 106/70   Pulse  (!) 107   Temp 98.1 F (36.7 C) (Temporal)   SpO2 100%    Physical Exam Vitals reviewed.  Constitutional:      General: She is not in acute distress.    Appearance: Normal appearance. She is not ill-appearing, toxic-appearing or diaphoretic.  HENT:     Head: Normocephalic and atraumatic.  Eyes:     General: No scleral icterus.       Right eye: No discharge.        Left eye: No discharge.     Conjunctiva/sclera: Conjunctivae normal.  Cardiovascular:     Rate and Rhythm: Normal rate and regular rhythm.     Heart sounds: Normal heart sounds. No murmur heard.   No friction rub. No gallop.  Pulmonary:     Effort: Pulmonary effort is normal. No respiratory distress.     Breath sounds: Normal breath sounds. No stridor. No wheezing, rhonchi or rales.  Musculoskeletal:        General: Normal range of motion.     Cervical back: Normal range of motion.  Skin:    General: Skin is warm and dry.     Capillary Refill: Capillary refill takes less than 2 seconds.  Neurological:     General: No focal deficit present.     Mental Status: She is alert and oriented to person, place, and time. Mental status is at baseline.  Psychiatric:        Mood and Affect: Mood normal.        Behavior: Behavior normal.        Thought Content: Thought content normal.        Judgment: Judgment normal.   6-minute walk: lowest oxygen saturation 94% while walking 97% on room air resting before walk

## 2021-04-15 NOTE — Telephone Encounter (Signed)
Returned sons call and answered all his questions about todays visit.

## 2021-04-15 NOTE — Telephone Encounter (Signed)
Pts daughter Heriberto Antigua left VM late yesterday afternoon stating pts son said that pt was showing signs of a heart attack. I called [facility] x3 and the call could not be completed each time I received a recording stating call rejected. Upon investigation I see that pt is checked in for an office visit with her PCP.

## 2021-04-15 NOTE — Telephone Encounter (Signed)
Novo nordisk insulin shipment arrived at PCP office Supply is in fridge Daughter verbalizes understanding

## 2021-04-15 NOTE — Telephone Encounter (Signed)
Form faxed along with today's OV note with [facility] ([place]) [phone]

## 2021-04-18 ENCOUNTER — Telehealth: Payer: Medicare Other

## 2021-04-18 ENCOUNTER — Encounter (HOSPITAL_COMMUNITY): Payer: Self-pay

## 2021-04-18 ENCOUNTER — Ambulatory Visit: Payer: Medicare Other | Admitting: Nurse Practitioner

## 2021-04-18 NOTE — Telephone Encounter (Signed)
Pt was seen on 04/15/21 & orders were faxed to [facility]

## 2021-04-19 ENCOUNTER — Inpatient Hospital Stay: Payer: Medicare Other | Admitting: Nurse Practitioner

## 2021-04-20 ENCOUNTER — Ambulatory Visit: Payer: Self-pay | Admitting: Pharmacist

## 2021-04-20 ENCOUNTER — Telehealth: Payer: Self-pay | Admitting: *Deleted

## 2021-04-20 DIAGNOSIS — E1169 Type 2 diabetes mellitus with other specified complication: Secondary | ICD-10-CM

## 2021-04-20 NOTE — Patient Instructions (Signed)
Visit Information  PATIENT GOALS:  Goals Addressed               This Visit's Progress     Patient Stated     T2DM (pt-stated)        Current Barriers:  Unable to independently afford treatment regimen Unable to achieve control of T2DM  Unable to maintain control of T2DM Suboptimal therapeutic regimen for T2DM   Pharmacist Clinical Goal(s):  Over the next 90 days, patient will verbalize ability to afford treatment regimen achieve control of T2DM as evidenced by LOWERING OF A1C achieve ability to self administer medications as prescribed through use of CARE[employer]IVER HELP ([place] as evidenced by patient report through collaboration with PharmD and provider.    Interventions: 1:1 collaboration with Dettinger, Fransisca Kaufmann, MD regarding development and update of comprehensive plan of care as evidenced by provider attestation and co-signature Inter-disciplinary care team collaboration (see longitudinal plan of care) Comprehensive medication review performed; medication list updated in electronic medical record  Diabetes: Uncontrolled; current treatment: TRESIBA, NOVOLOG;  Daughter/son oversee medications; patient can be forgetful when administering medications [phone] daughter's number) Will consider GLP1 moving forwards as son/daughter can administer it once weekly Attempting to get patient libre 2 CGM-submitted request to advanced diabetes via [facility] portal-->LIBRE CGM DELIVERED TO PCP OFFICE, REVIEWED WITH DAUGHTER AND APPT SCHEDULED TO [facility] Patient was using health department for medication, however the stated they are no longer able to help Current glucose readings: fasting glucose: 200-300, post prandial glucose: 300s Denies hypoglycemic/hyperglycemic symptoms Discussed meal planning options and Plate method for healthy eating Avoid sugary drinks and desserts Incorporate balanced protein, non starchy  veggies, 1 serving of carbohydrate with each meal Increase water intake Increase physical activity as able Current exercise: N/A Counseled on medication administration; sliding scale insulin provided & printed out for patient Assessed patient finances. Patient is enrolled with health department for patient assistance; asked daughter to let us know if/when we need to apply for medications   Patient Goals/Self-Care Activities Over the next 90 days, patient will:  - take medications as prescribed focus on medication adherence by daughter/son helping to administer medications appropriately  Follow Up Plan: Face to Face appointment with care management team member scheduled for:  04/22/21         The patient verbalized understanding of instructions, educational materials, and care plan provided today and declined offer to receive copy of patient instructions, educational materials, and care plan.   Face to Face appointment with care management team member scheduled for: 04/22/21  Signature Regina Eck, PharmD, BCPS Clinical Pharmacist, [place]  II Phone [phone]

## 2021-04-20 NOTE — Telephone Encounter (Signed)
FYI: Vm from Kathlee Nations w/ [facility] Pt declined services last evening when Kathlee Nations contacted her to set up start of care date

## 2021-04-20 NOTE — Progress Notes (Signed)
Chronic Care Management Pharmacy Note  04/20/2021 Name:  Hannah French MRN:  [id number] DOB:  December 22, 1940  Summary: diabetes f/u  Recommendations/Changes made from today's visit: Diabetes: Uncontrolled; current treatment: TRESIBA, NOVOLOG; JARDIANCE Daughter/son oversee medications; patient can be forgetful when administering medications [phone] daughter's number) Will consider GLP1 moving forwards as son/daughter can administer it once weekly Attempting to get patient libre 2 CGM-submitted request to advanced diabetes via parach[employer] portal-->LIBRE CGM DELIVERED TO PCP OFFICE, REVIEWED WITH DAUGHTER AND APPT SCHEDULED TO [facility] Patient was using health department for medication, however the stated they are no longer able to help Current glucose readings: fasting glucose: 200-300, post prandial glucose: 300s Denies hypoglycemic/hyperglycemic symptoms Current exercise: N/A Counseled on medication administration; sliding scale insulin provided & printed out for patient Assessed patient finances. Patient is enrolled with health department for patient assistance; asked daughter to let us know if/when we need to apply for medications  Follow Up Plan: Face to Face appointment with care management team member scheduled for:  04/22/21  Subjective: Hannah French is an 80 y.o. year old female who is a primary patient of Dettinger, Fransisca Kaufmann, MD.  The CCM team was consulted for assistance with disease management and care coordination needs.    Engaged with patient by telephone for follow up visit in response to provider referral for pharmacy case management and/or care coordination services.   Consent to Services:  The patient was given information about Chronic Care Management services, agreed to services, and gave verbal consent prior to initiation of services.  Please see initial visit note for detailed documentation.   Patient Care Team: Dettinger,  Fransisca Kaufmann, MD as PCP - General (Family Medicine) Bensimhon, Shaune Pascal, MD as PCP - Cardiology (Cardiology) Bensimhon, Shaune Pascal, MD as Consulting Physician (Cardiology) Ilean China, RN as Case Manager Bridgit Eynon, Royce Macadamia, [facility] as Pharmacist (Family Medicine)  Objective:  Lab Results  Component Value Date   CREATININE 1.68 (H) 04/15/2021   CREATININE 1.73 (H) 04/12/2021   CREATININE 1.87 (H) 04/11/2021    Lab Results  Component Value Date   HGBA1C 8.1 (H) 04/06/2021   Last diabetic Eye exam: No results found for: HMDIABEYEEXA  Last diabetic Foot exam: No results found for: HMDIABFOOTEX      Component Value Date/Time   CHOL 157 02/17/2020 1159   TRIG 171 (H) 04/06/2021 0534   TRIG 82 06/09/2014 1028   HDL 41 02/17/2020 1159   HDL 57 06/09/2014 1028   CHOLHDL 3.8 02/17/2020 1159   CHOLHDL 3.5 02/06/2013 0932   VLDL 20 02/06/2013 0932   LDLCALC 93 02/17/2020 1159   LDLCALC 131 (H) 06/09/2014 1028    Hepatic Function Latest Ref Rng & Units 04/15/2021 04/12/2021 04/11/2021  Total Protein 6.0 - 8.5 g/dL 6.5 6.2(L) 5.7(L)  Albumin 3.7 - 4.7 g/dL 4.2 3.4(L) 3.2(L)  AST 0 - 40 IU/L _0 ALT 0 - 32 IU/L _1 Alk Phosphatase 44 - 121 IU/L 60 36(L) 35(L)  Total Bilirubin 0.0 - 1.2 mg/dL 0.7 1.2 1.0  Bilirubin, Direct 0.0 - 0.2 mg/dL - - -    Lab Results  Component Value Date/Time   TSH 2.540 06/09/2014 10:28 AM    CBC Latest Ref Rng & Units 04/15/2021 04/12/2021 04/11/2021  WBC 3.4 - 10.8 x10E3/uL 11.3(H) 12.6(H) 11.8(H)  Hemoglobin 11.1 - 15.9 g/dL 11.0(L) 10.6(L) 10.4(L)  Hematocrit 34.0 - 46.6 % 34.6 34.9(L) 33.8(L)  Platelets 150 -  450 x10E3/uL 293 284 289    Lab Results  Component Value Date/Time   VD25OH 103.0 (H) 02/17/2020 11:59 AM   VD25OH 54.9 08/08/2019 01:32 PM    Clinical ASCVD: No  The ASCVD Risk score Mikey Bussing DC Jr., et al., 2013) failed to calculate for the following reasons:   The 2013 ASCVD risk score is only valid for ages 22 to 24    Other:  (CHADS2VASc if Afib, PHQ9 if depression, MMRC or CAT for COPD, ACT, DEXA)  Social History   Tobacco Use  Smoking Status Every Day   Packs/day: 1.00   Years: 50.00   Pack years: 50.00   Types: Cigarettes  Smokeless Tobacco Never  Tobacco Comments   still smoking as of 03/01/21   BP Readings from Last 3 Encounters:  04/15/21 106/70  04/12/21 (!) 103/59  04/01/21 (!) 103/53   Pulse Readings from Last 3 Encounters:  04/15/21 (!) 107  04/12/21 (!) 105  04/01/21 (!) 105   Wt Readings from Last 3 Encounters:  04/12/21 116 lb 6.5 oz (52.8 kg)  04/01/21 114 lb (51.7 kg)  03/01/21 115 lb (52.2 kg)    Assessment: Review of patient past medical history, allergies, medications, health status, including review of consultants reports, laboratory and other test data, was performed as part of comprehensive evaluation and provision of chronic care management services.   SDOH:  (Social Determinants of Health) assessments and interventions performed:    CCM Care Plan  Allergies  Allergen Reactions   Contrast Media [Iodinated Diagnostic Agents] Shortness Of Breath   Iohexol Other (See Comments)    PASSED OUT DURING THE TEST     Metformin Swelling    Face and hands became swollen   Statins Swelling and Other (See Comments)    They make the patient hurt "all over" Myalgia, also   Bextra [Valdecoxib] Nausea And Vomiting and Swelling    Shut down my kidneys    Cefdinir Other (See Comments)    Pancreatitis    Cozaar [Losartan Potassium] Other (See Comments)    dizziness   Gabapentin    Nsaids Other (See Comments)    GI intolerance and pain all over, tolerates ibu    Zetia [Ezetimibe] Swelling   Rofecoxib Other (See Comments)    "VIOXX"- "SHUT DOWN MY KIDNEYS"   Sulfa Antibiotics Nausea And Vomiting and Other (See Comments)    Pancreatitis    Medications Reviewed Today     Reviewed by Loman Brooklyn, FNP (Family Nurse Practitioner) on 04/15/21 at [facility] List Status:  <None>   Medication Order Taking? Sig Documenting Provider Last Dose Status Informant  acetaminophen (TYLENOL) 500 MG tablet [id number] Yes Take 500 mg by mouth every 6 (six) hours as needed for moderate pain. [provider] Taking Active Child  albuterol (VENTOLIN HFA) 108 (90 Base) MCG/ACT inhaler [id number] Yes Inhale 2 puffs into the lungs every 6 (six) hours as needed for wheezing or shortness of breath. Martyn Ehrich, NP Taking Active Child  Cholecalciferol (VITAMIN D) 50 MCG (2000 UT) tablet [id number] Yes Take 4,000 Units by mouth daily.  [provider] Taking Active Child           Med Note Dimitri Ped, AMANDA L   Tue May 18, 2020  2:01 PM)    clopidogrel (PLAVIX) 75 MG tablet [id number] Yes Take 1 tablet (75 mg total) by mouth daily. Cheryln Manly, NP Taking Active Child  empagliflozin (JARDIANCE) 10 MG TABS tablet [id number] Yes Take  1 tablet (10 mg total) by mouth daily. Elodia Florence., MD Taking Active   ferrous sulfate 325 (65 FE) MG tablet [id number] Yes Take 1 tablet (325 mg total) by mouth daily with breakfast. Dettinger, Fransisca Kaufmann, MD Taking Active Child  Fluticasone-Umeclidin-Vilant (TRELEGY ELLIPTA) 100-62.5-25 MCG/INH AEPB [id number] Yes Inhale 1 puff into the lungs daily. Martyn Ehrich, NP Taking Active Child           Med Note Clydell Hakim   Wed Apr 06, 2021  7:45 AM)    furosemide (LASIX) 20 MG tablet [id number] Yes Take 2 tablets daily. You may take an extra dose for increase weight gain orswelling Dettinger, Fransisca Kaufmann, MD Taking Active Child  Glucose Blood (BLOOD GLUCOSE TEST STRIPS) STRP [id number] Yes 1 strip by In Vitro route 2 (two) times daily. Dettinger, Fransisca Kaufmann, MD Taking Active Child  insulin aspart (NOVOLOG) 100 UNIT/ML injection [id number] Yes Inject 0-9 Units into the skin 3 (three) times daily with meals. Sliding Scale CBG 70 - 120: 0 units  CBG 121 - 150: 1 unit  CBG 151 - 200: 2 units  CBG 201 - 250: 3 units  CBG 251 - 300:  5 units  CBG 301 - 350: 7 units  CBG 351 - 400: 9 units  CBG > 400: give 9 units and call your MD for additional instructions Elodia Florence., MD Taking Active   insulin degludec (TRESIBA FLEXTOUCH) 100 UNIT/ML FlexTouch Pen [id number] Yes Inject 7 Units into the skin daily. Elodia Florence., MD Taking Active   levalbuterol [facility]) 0.63 MG/3ML nebulizer solution [id number] Yes NEBULIZE 1 VIAL EVERY 4 HOURS AS NEEDED FOR WHEEZING OR SHORTNESS OF Hulan Amato, MD Taking Active Child  Naphazoline-Pheniramine (OPCON-A) 0.027-0.315 % SOLN [id number] Yes Place 1 drop into both eyes daily as needed (Burning and itching eyes). [provider] Taking Active Child  nitroGLYCERIN (NITRODUR - DOSED IN MG/24 HR) 0.2 mg/hr patch [id number] Yes Place 1 patch (0.2 mg total) onto the skin daily. Newt Minion, MD Taking Active Child  nitroGLYCERIN (NITROSTAT) 0.3 MG SL tablet [id number] Yes Place 1 tablet (0.3 mg total) under the tongue every 5 (five) minutes as needed for chest pain. Dettinger, Fransisca Kaufmann, MD Taking Active Child  pantoprazole (PROTONIX) 40 MG tablet [id number] Yes Take 1 tablet (40 mg total) by mouth 2 (two) times daily for 20 days. Deatra James, MD Taking Active Child  pentoxifylline (TRENTAL) 400 MG CR tablet [id number] Yes TAKE  (1)  TABLET  THREE TIMES DAILY WITH MEALS. Newt Minion, MD Taking Active Child  potassium chloride SA (KLOR-CON) 20 MEQ tablet [id number] Yes Take 2 tablets (40 mEq total) by mouth as directed. Only when you take Metolazone Bensimhon, Shaune Pascal, MD Taking Active Child  sennosides-docusate sodium (SENOKOT-S) 8.6-50 MG tablet [id number] Yes Take 1 tablet by mouth daily. Dettinger, Fransisca Kaufmann, MD Taking Active Child            Patient Active Problem List   Diagnosis Date Noted   Acute respiratory failure ([place]) 04/06/2021   Respiratory failure ([place]) 04/06/2021   Syncope 02/19/2021   Acute GI bleeding 02/19/2021   Acute on chronic blood  loss anemia 02/19/2021   Acute respiratory failure with hypoxia ([place]) 01/17/2021   Elevated MCV 01/17/2021   Leukocytosis 01/17/2021   CKD (chronic kidney disease), stage III ([place]) 01/17/2021   Hyperglycemia due to diabetes mellitus ([place]) 01/17/2021   Skin abscess 01/15/2020   Depression,  recurrent ([place]) 08/08/2019   Cigarette smoker 06/19/2019   Vitamin D deficiency 01/29/2019   Dizziness 12/03/2018   Chronic combined systolic and diastolic CHF (congestive heart failure) ([place]) 12/03/2018   Coronary artery disease involving native coronary artery of native heart with angina pectoris ([place]) 10/04/2018   Hyperlipidemia 10/04/2018   Pain in right hand 10/02/2018   Myalgia due to statin 03/29/2018   Stage 3 severe COPD by GOLD classification ([place]) 10/26/2017   Tobacco abuse 03/07/2016   Type 2 diabetes mellitus ([place]) 08/26/2015   Osteoporosis 09/02/2014   Carotid stenosis 07/22/2012   PVD 07/02/2009   BLADDER CANCER 06/05/2009   Hyperlipidemia associated with type 2 diabetes mellitus ([place]) 06/05/2009   PSEUDOGOUT 06/05/2009   MITRAL VALVE PROLAPSE 06/05/2009    Immunization History  Administered Date(s) Administered   Fluad Quad(high Dose 65+) 06/14/2019, 07/22/2020   Influenza Inj Mdck Quad Pf 06/14/2019   Influenza, High Dose Seasonal PF 08/14/2017   Influenza,inj,Quad PF,6+ Mos 08/06/2013, 07/20/2014, 07/30/2015   Influenza-Unspecified 07/05/2018   Moderna Sars-Covid-2 Vaccination 11/06/2019, 12/05/2019, 06/16/2020   Pneumococcal Conjugate-13 09/09/2020   Pneumococcal Polysaccharide-23 08/08/2019   Tdap 03/17/2011   Zoster Recombinat (Shingrix) 11/14/2017, 04/19/2018   Zoster, Live 05/12/2010    Conditions to be addressed/monitored: DMII  There are no care plans that you recently modified to display for this patient.   Medication Assistance:   PATIENT IS ENROLLED WITH HEALTH DEPARTMENT; PATIENT'S DAUGHTER TO LET ME KNOW WHEN/IF CERTAIN MEDIATIONS ARE NOT BEING SUPPLIED  ANY LONGER  Patient's preferred pharmacy is:  [facility], [address] [place] [address] Phone: [phone] Fax: [phone]  [facility] [place], [place] - [address] [place] [address] [place] [address] Phone: [phone] Fax: [phone]  Uses pill box? No - DAUGHTER ASSISTS  Follow Up:  Patient agrees to Care Plan and Follow-up.  Plan: Face to Face appointment with care management team member scheduled for: 04/22/21  Regina Eck, PharmD, BCPS Clinical Pharmacist, [place]  II Phone [phone]

## 2021-04-22 ENCOUNTER — Ambulatory Visit: Payer: Medicare Other | Admitting: Pharmacist

## 2021-04-22 ENCOUNTER — Telehealth (HOSPITAL_COMMUNITY): Payer: Self-pay | Admitting: *Deleted

## 2021-04-22 ENCOUNTER — Other Ambulatory Visit: Payer: Self-pay

## 2021-04-22 ENCOUNTER — Encounter: Payer: Self-pay | Admitting: *Deleted

## 2021-04-22 ENCOUNTER — Ambulatory Visit: Payer: Medicare Other | Admitting: *Deleted

## 2021-04-22 DIAGNOSIS — E1169 Type 2 diabetes mellitus with other specified complication: Secondary | ICD-10-CM

## 2021-04-22 DIAGNOSIS — I5042 Chronic combined systolic (congestive) and diastolic (congestive) heart failure: Secondary | ICD-10-CM

## 2021-04-22 NOTE — Telephone Encounter (Signed)
Pt aware and agreeable with plan.  

## 2021-04-22 NOTE — Progress Notes (Signed)
Chronic Care Management Pharmacy Note  04/22/2021 Name:  Hannah French MRN:  [id number] DOB:  20-Feb-1941  Summary: diabetes management   Recommendations/Changes made from today's visit:  Interventions: 1:1 collaboration with Dettinger, Fransisca Kaufmann, MD regarding development and update of comprehensive plan of care as evidenced by provider attestation and co-signature Inter-disciplinary care team collaboration (see longitudinal plan of care) Comprehensive medication review performed; medication list updated in electronic medical record  Diabetes: Uncontrolled; current treatment: TRESIBA, NOVOLOG;  Daughter/son oversee medications; patient can be forgetful when administering medications [phone] daughter's number); patient is now on hospice Recommended increase in [place] to 10 units--patient still running FBG in 200s; Novolog per sliding scale Libre 2 CGM education provided to patient and daughter in office today; applied to left upper arm (nicotine patch on the other arm) Libre 2 supplied by [employer]  Patient was using health department for medication, however the stated they are no longer able to help Current glucose readings: fasting glucose: 200-300, post prandial glucose: 300s Denies hypoglycemic/hyperglycemic symptoms Discussed meal planning options and Plate method for healthy eating Avoid sugary drinks and desserts Incorporate balanced protein, non starchy veggies, 1 serving of carbohydrate with each meal Increase water intake Increase physical activity as able Current exercise: N/A Counseled on libre 2 CGM, reviewed insulin Assessed patient finances. Patient is enrolled with health department for patient assistance; asked daughter to let us know if/when we need to apply for medications  Follow Up Plan: No further follow up required: patient now under hospice care   Subjective: Hannah French is an 80 y.o. year old female who is a  primary patient of Dettinger, Fransisca Kaufmann, MD.  The CCM team was consulted for assistance with disease management and care coordination needs.    Engaged with patient face to face for follow up visit in response to provider referral for pharmacy case management and/or care coordination services.   Consent to Services:  The patient was given information about Chronic Care Management services, agreed to services, and gave verbal consent prior to initiation of services.  Please see initial visit note for detailed documentation.   Patient Care Team: Dettinger, Fransisca Kaufmann, MD as PCP - General (Family Medicine) Bensimhon, Shaune Pascal, MD as PCP - Cardiology (Cardiology) Bensimhon, Shaune Pascal, MD as Consulting Physician (Cardiology) Ilean China, RN as Case Manager , Royce Macadamia, [facility] as Pharmacist (Family Medicine)  bjective:  Lab Results  Component Value Date   CREATININE 1.68 (H) 04/15/2021   CREATININE 1.73 (H) 04/12/2021   CREATININE 1.87 (H) 04/11/2021    Lab Results  Component Value Date   HGBA1C 8.1 (H) 04/06/2021   Last diabetic Eye exam: No results found for: HMDIABEYEEXA  Last diabetic Foot exam: No results found for: HMDIABFOOTEX      Component Value Date/Time   CHOL 157 02/17/2020 1159   TRIG 171 (H) 04/06/2021 0534   TRIG 82 06/09/2014 1028   HDL 41 02/17/2020 1159   HDL 57 06/09/2014 1028   CHOLHDL 3.8 02/17/2020 1159   CHOLHDL 3.5 02/06/2013 0932   VLDL 20 02/06/2013 0932   LDLCALC 93 02/17/2020 1159   LDLCALC 131 (H) 06/09/2014 1028    Hepatic Function Latest Ref Rng & Units 04/15/2021 04/12/2021 04/11/2021  Total Protein 6.0 - 8.5 g/dL 6.5 6.2(L) 5.7(L)  Albumin 3.7 - 4.7 g/dL 4.2 3.4(L) 3.2(L)  AST 0 - 40 IU/L _0 ALT 0 - 32 IU/L _1 Alk  Phosphatase 44 - 121 IU/L 60 36(L) 35(L)  Total Bilirubin 0.0 - 1.2 mg/dL 0.7 1.2 1.0  Bilirubin, Direct 0.0 - 0.2 mg/dL - - -    Lab Results  Component Value Date/Time   TSH 2.540 06/09/2014 10:28 AM    CBC  Latest Ref Rng & Units 04/15/2021 04/12/2021 04/11/2021  WBC 3.4 - 10.8 x10E3/uL 11.3(H) 12.6(H) 11.8(H)  Hemoglobin 11.1 - 15.9 g/dL 11.0(L) 10.6(L) 10.4(L)  Hematocrit 34.0 - 46.6 % 34.6 34.9(L) 33.8(L)  Platelets 150 - 450 x10E3/uL 293 284 289    Lab Results  Component Value Date/Time   VD25OH 103.0 (H) 02/17/2020 11:59 AM   VD25OH 54.9 08/08/2019 01:32 PM    Clinical ASCVD: No  The ASCVD Risk score Mikey Bussing DC Jr., et al., 2013) failed to calculate for the following reasons:   The 2013 ASCVD risk score is only valid for ages 41 to 75    Other: (CHADS2VASc if Afib, PHQ9 if depression, MMRC or CAT for COPD, ACT, DEXA)  Social History   Tobacco Use  Smoking Status Every Day   Packs/day: 1.00   Years: 50.00   Pack years: 50.00   Types: Cigarettes  Smokeless Tobacco Never  Tobacco Comments   still smoking as of 03/01/21   BP Readings from Last 3 Encounters:  04/15/21 106/70  04/12/21 (!) 103/59  04/01/21 (!) 103/53   Pulse Readings from Last 3 Encounters:  04/15/21 (!) 107  04/12/21 (!) 105  04/01/21 (!) 105   Wt Readings from Last 3 Encounters:  04/12/21 116 lb 6.5 oz (52.8 kg)  04/01/21 114 lb (51.7 kg)  03/01/21 115 lb (52.2 kg)    Assessment: Review of patient past medical history, allergies, medications, health status, including review of consultants reports, laboratory and other test data, was performed as part of comprehensive evaluation and provision of chronic care management services.   SDOH:  (Social Determinants of Health) assessments and interventions performed:    CCM Care Plan  Allergies  Allergen Reactions   Contrast Media [Iodinated Diagnostic Agents] Shortness Of Breath   Iohexol Other (See Comments)    PASSED OUT DURING THE TEST     Metformin Swelling    Face and hands became swollen   Statins Swelling and Other (See Comments)    They make the patient hurt "all over" Myalgia, also   Bextra [Valdecoxib] Nausea And Vomiting and Swelling     Shut down my kidneys    Cefdinir Other (See Comments)    Pancreatitis    Cozaar [Losartan Potassium] Other (See Comments)    dizziness   Gabapentin    Nsaids Other (See Comments)    GI intolerance and pain all over, tolerates ibu    Zetia [Ezetimibe] Swelling   Rofecoxib Other (See Comments)    "VIOXX"- "SHUT DOWN MY KIDNEYS"   Sulfa Antibiotics Nausea And Vomiting and Other (See Comments)    Pancreatitis    Medications Reviewed Today     Reviewed by Ilean China, RN (Registered Nurse) on 04/22/21 at [place] List Status: <None>   Medication Order Taking? Sig Documenting Provider Last Dose Status Informant  acetaminophen (TYLENOL) 500 MG tablet [id number] No Take 500 mg by mouth every 6 (six) hours as needed for moderate pain. [provider] Taking Active Child  albuterol (VENTOLIN HFA) 108 (90 Base) MCG/ACT inhaler [id number] No Inhale 2 puffs into the lungs every 6 (six) hours as needed for wheezing or shortness of breath. Martyn Ehrich, NP Taking  Active Child  Cholecalciferol (VITAMIN D) 50 MCG (2000 UT) tablet [id number] No Take 4,000 Units by mouth daily.  [provider] Taking Active Child           Med Note Dimitri Ped, AMANDA L   Tue May 18, 2020  2:01 PM)    clopidogrel (PLAVIX) 75 MG tablet [id number] No Take 1 tablet (75 mg total) by mouth daily. Cheryln Manly, NP Taking Active Child  empagliflozin (JARDIANCE) 10 MG TABS tablet [id number] No Take 1 tablet (10 mg total) by mouth daily. Elodia Florence., MD Taking Active   ferrous sulfate 325 (65 FE) MG tablet [id number] No Take 1 tablet (325 mg total) by mouth daily with breakfast. Dettinger, Fransisca Kaufmann, MD Taking Active Child  Fluticasone-Umeclidin-Vilant (TRELEGY ELLIPTA) 100-62.5-25 MCG/INH AEPB [id number] No Inhale 1 puff into the lungs daily. Martyn Ehrich, NP Taking Active Child           Med Note Clydell Hakim   Wed Apr 06, 2021  7:45 AM)    furosemide (LASIX) 20 MG tablet  [id number] No Take 2 tablets daily. You may take an extra dose for increase weight gain orswelling Dettinger, Fransisca Kaufmann, MD Taking Active Child  Glucose Blood (BLOOD GLUCOSE TEST STRIPS) STRP [id number] No 1 strip by In Vitro route 2 (two) times daily. Dettinger, Fransisca Kaufmann, MD Taking Active Child  insulin aspart (NOVOLOG) 100 UNIT/ML injection [id number] No Inject 0-9 Units into the skin 3 (three) times daily with meals. Sliding Scale CBG 70 - 120: 0 units  CBG 121 - 150: 1 unit  CBG 151 - 200: 2 units  CBG 201 - 250: 3 units  CBG 251 - 300: 5 units  CBG 301 - 350: 7 units  CBG 351 - 400: 9 units  CBG > 400: give 9 units and call your MD for additional instructions Elodia Florence., MD Taking Active   insulin degludec (TRESIBA FLEXTOUCH) 100 UNIT/ML FlexTouch Pen [id number] No Inject 7 Units into the skin daily. Elodia Florence., MD Taking Active            Med Note Blanca Friend, Royce Macadamia   Fri Apr 22, 2021  3:19 PM) 10 UNITS  levalbuterol (XOPENEX) 0.63 MG/3ML nebulizer solution [id number] No NEBULIZE 1 VIAL EVERY 4 HOURS AS NEEDED FOR WHEEZING OR SHORTNESS OF Hulan Amato, MD Taking Active Child  Naphazoline-Pheniramine (OPCON-A) 0.027-0.315 % SOLN [id number] No Place 1 drop into both eyes daily as needed (Burning and itching eyes). [provider] Taking Active Child  nitroGLYCERIN (NITRODUR - DOSED IN MG/24 HR) 0.2 mg/hr patch [id number] No Place 1 patch (0.2 mg total) onto the skin daily. Newt Minion, MD Taking Active Child  nitroGLYCERIN (NITROSTAT) 0.3 MG SL tablet [id number] No Place 1 tablet (0.3 mg total) under the tongue every 5 (five) minutes as needed for chest pain. Dettinger, Fransisca Kaufmann, MD Taking Active Child  pantoprazole (PROTONIX) 40 MG tablet [id number] No Take 1 tablet (40 mg total) by mouth 2 (two) times daily for 20 days. Deatra James, MD Taking Active Child  pentoxifylline (TRENTAL) 400 MG CR tablet [id number] No TAKE  (1)  TABLET  THREE TIMES DAILY WITH  MEALS. Newt Minion, MD Taking Active Child  potassium chloride SA (KLOR-CON) 20 MEQ tablet [id number] No Take 2 tablets (40 mEq total) by mouth as directed. Only when you take Metolazone Bensimhon, Shaune Pascal, MD Taking Active Child  sennosides-docusate sodium (SENOKOT-S) 8.6-50 MG tablet  [id number] No Take 1 tablet by mouth daily. Dettinger, Fransisca Kaufmann, MD Taking Active Child            Patient Active Problem List   Diagnosis Date Noted   Acute respiratory failure ([place]) 04/06/2021   Respiratory failure ([place]) 04/06/2021   Syncope 02/19/2021   Acute GI bleeding 02/19/2021   Acute on chronic blood loss anemia 02/19/2021   Acute respiratory failure with hypoxia ([place]) 01/17/2021   Elevated MCV 01/17/2021   Leukocytosis 01/17/2021   CKD (chronic kidney disease), stage III ([place]) 01/17/2021   Hyperglycemia due to diabetes mellitus ([place]) 01/17/2021   Skin abscess 01/15/2020   Depression, recurrent ([place]) 08/08/2019   Cigarette smoker 06/19/2019   Vitamin D deficiency 01/29/2019   Dizziness 12/03/2018   Chronic combined systolic and diastolic CHF (congestive heart failure) ([place]) 12/03/2018   Coronary artery disease involving native coronary artery of native heart with angina pectoris ([place]) 10/04/2018   Hyperlipidemia 10/04/2018   Pain in right hand 10/02/2018   Myalgia due to statin 03/29/2018   Stage 3 severe COPD by GOLD classification ([place]) 10/26/2017   Tobacco abuse 03/07/2016   Type 2 diabetes mellitus ([place]) 08/26/2015   Osteoporosis 09/02/2014   Carotid stenosis 07/22/2012   PVD 07/02/2009   BLADDER CANCER 06/05/2009   Hyperlipidemia associated with type 2 diabetes mellitus ([place]) 06/05/2009   PSEUDOGOUT 06/05/2009   MITRAL VALVE PROLAPSE 06/05/2009    Immunization History  Administered Date(s) Administered   Fluad Quad(high Dose 65+) 06/14/2019, 07/22/2020   Influenza Inj Mdck Quad Pf 06/14/2019   Influenza, High Dose Seasonal PF 08/14/2017   Influenza,inj,Quad PF,6+ Mos  08/06/2013, 07/20/2014, 07/30/2015   Influenza-Unspecified 07/05/2018   Moderna Sars-Covid-2 Vaccination 11/06/2019, 12/05/2019, 06/16/2020   Pneumococcal Conjugate-13 09/09/2020   Pneumococcal Polysaccharide-23 08/08/2019   Tdap 03/17/2011   Zoster Recombinat (Shingrix) 11/14/2017, 04/19/2018   Zoster, Live 05/12/2010    Conditions to be addressed/monitored: DMII  Care Plan : PHARMD MEDICATION MANAGEMENT  Updates made by Lavera Guise, [place] since 04/22/2021 12:00 AM     Problem: DISEASE PROGRESSION PREVENTION      Long-Range Goal: T2DM   Recent Progress: Not on track  Priority: High  Note:   Current Barriers:  Unable to independently afford treatment regimen Unable to achieve control of T2DM  Unable to maintain control of T2DM Suboptimal therapeutic regimen for T2DM   Pharmacist Clinical Goal(s):  Over the next 90 days, patient will verbalize ability to afford treatment regimen achieve control of T2DM as evidenced by LOWERING OF A1C achieve ability to self administer medications as prescribed through use of CAREGIVER HELP (SON & DAUGHTER TO OVERSEE ADMINISTRATION OF MEDICATIONS as evidenced by patient report through collaboration with PharmD and provider.    Interventions: 1:1 collaboration with Dettinger, Fransisca Kaufmann, MD regarding development and update of comprehensive plan of care as evidenced by provider attestation and co-signature Inter-disciplinary care team collaboration (see longitudinal plan of care) Comprehensive medication review performed; medication list updated in electronic medical record  Diabetes: Uncontrolled; current treatment: TRESIBA, NOVOLOG;  Daughter/son oversee medications; patient can be forgetful when administering medications [phone] daughter's number); patient is now on hospice Recommended increase in [place] to 10 units--patient still running FBG in 200s; Novolog per sliding scale Libre 2 CGM education provided to patient and daughter  in office today; applied to left upper arm (nicotine patch on the other arm) Libre 2 supplied by [employer]  Patient was using health department for medication, however the stated they are no  longer able to help Current glucose readings: fasting glucose: 200-300, post prandial glucose: 300s Denies hypoglycemic/hyperglycemic symptoms Discussed meal planning options and Plate method for healthy eating Avoid sugary drinks and desserts Incorporate balanced protein, non starchy veggies, 1 serving of carbohydrate with each meal Increase water intake Increase physical activity as able Current exercise: N/A Counseled on libre 2 CGM, reviewed insulin Assessed patient finances. Patient is enrolled with health department for patient assistance; asked daughter to let us know if/when we need to apply for medications   Patient Goals/Self-Care Activities Over the next 90 days, patient will:  - take medications as prescribed focus on medication adherence by daughter/son helping to administer medications appropriately  Follow Up Plan: Face to Face appointment with care management team member scheduled for:  1 month      Medication Assistance:   PATIENT IS [place]; PATIENT'S DAUGHTER TO LET ME KNOW WHEN/IF CERTAIN MEDIATIONS ARE NOT BEING SUPPLIED ANY LONGER    Patient's preferred pharmacy is:  [place], [address] [place] [address] Phone: [phone] Fax: [place] [facility] [place], [address] [place] [facility] [place] [address] Phone: [phone] Fax: [phone]  Follow Up:  Patient requests no follow-up at this time.  Plan: No further follow up required: hospice  Regina Eck, PharmD, BCPS Clinical Pharmacist, [place]  II Phone [phone]

## 2021-04-22 NOTE — Telephone Encounter (Signed)
Pt called stating her weight is up 5lbs x 5days. Pt also c/o shortness of breath. Pt took an extra lasix pill yesterday and today and does not feel any better. Pt said the last time this happened Dr.Bensimhon had her take metolazone for two days. Pt said she still has metolazone at home and asked if she take a pill today. I told her I would speak with a provider and call her back.   Routed to [employer] for advice

## 2021-04-25 ENCOUNTER — Telehealth: Payer: Self-pay | Admitting: Internal Medicine

## 2021-04-25 NOTE — Patient Instructions (Signed)
Visit Information  PATIENT GOALS:  Goals Addressed             This Visit's Progress    Manage Symptoms and Maintain Comfort       Timeframe:  Short-Term Goal Priority:  Medium Start Date: 04/22/21                            Expected End Date: 07/23/21                      Follow-up: No CCM follow-up needed\  Keep all medical appointments Call cardiologist with any weight gain of more than 3lbs overnight or 5lbs in one week Work with [facility] regarding Care Management and Care Coordination     COMPLETED: Track and Manage Fluids and Swelling-Heart Failure       Timeframe:  Long-Range Goal Priority:  High Start Date:    01/31/21                         Expected End Date:    09/24/21                   Follow Up Date 04/22/21   Continue to monitor and record weight each morning Call Cardiologist or PCP with any weight gain of more than 3 lbs overnight or 5 lbs in one week Keep all medical appointments Take medication as prescribed Follow a low sodium/DASH diet Call RN Care Manager as needed [phone]    Why is this important?   It is important to check your weight daily and watch how much salt and liquids you have.  It will help you to manage your heart failure.    Notes:         Patient verbalizes understanding of instructions provided today and agrees to view in [place].   No further follow up required: Patient is enrolled in Hospice with [facility], BSN, RN-BC [place] [facility] [place] [facility] Direct Dial: [phone]

## 2021-04-25 NOTE — Chronic Care Management (AMB) (Signed)
Care Management    RN Visit Note  04/22/2021 Name: [name] MRN: [id number] DOB: [birth year]  Subjective: Hannah French is a 80 y.o. year old female who is a primary care patient of Dettinger, Fransisca Kaufmann, MD. The care management team was consulted for assistance with disease management and care coordination needs.    Engaged with patient by telephone for follow up visit in response to provider referral for case management and/or care coordination services.   Consent to Services:   [name] was given information about Care Management services today including:  Care Management services includes personalized support from designated clinical staff supervised by her physician, including individualized plan of care and coordination with other care providers 24/7 contact phone numbers for assistance for urgent and routine care needs. The patient may stop case management services at any time by phone call to the office staff.  Patient agreed to services and consent obtained.   Assessment: Review of patient past medical history, allergies, medications, health status, including review of consultants reports, laboratory and other test data, was performed as part of comprehensive evaluation and provision of chronic care management services.   SDOH (Social Determinants of Health) assessments and interventions performed:    Care Plan  Allergies  Allergen Reactions   Contrast Media [Iodinated Diagnostic Agents] Shortness Of Breath   Iohexol Other (See Comments)    PASSED OUT DURING THE TEST     Metformin Swelling    Face and hands became swollen   Statins Swelling and Other (See Comments)    They make the patient hurt "all over" Myalgia, also   Bextra [Valdecoxib] Nausea And Vomiting and Swelling    Shut down my kidneys    Cefdinir Other (See Comments)    Pancreatitis    Cozaar [Losartan Potassium] Other (See Comments)    dizziness   Gabapentin    Nsaids Other  (See Comments)    GI intolerance and pain all over, tolerates ibu    Zetia [Ezetimibe] Swelling   Rofecoxib Other (See Comments)    "VIOXX"- "SHUT DOWN MY KIDNEYS"   Sulfa Antibiotics Nausea And Vomiting and Other (See Comments)    Pancreatitis    Outpatient Encounter Medications as of 04/22/2021  Medication Sig Note   acetaminophen (TYLENOL) 500 MG tablet Take 500 mg by mouth every 6 (six) hours as needed for moderate pain.    albuterol (VENTOLIN HFA) 108 (90 Base) MCG/ACT inhaler Inhale 2 puffs into the lungs every 6 (six) hours as needed for wheezing or shortness of breath.    Cholecalciferol (VITAMIN D) 50 MCG (2000 UT) tablet Take 4,000 Units by mouth daily.     clopidogrel (PLAVIX) 75 MG tablet Take 1 tablet (75 mg total) by mouth daily.    empagliflozin (JARDIANCE) 10 MG TABS tablet Take 1 tablet (10 mg total) by mouth daily.    ferrous sulfate 325 (65 FE) MG tablet Take 1 tablet (325 mg total) by mouth daily with breakfast.    Fluticasone-Umeclidin-Vilant (TRELEGY ELLIPTA) 100-62.5-25 MCG/INH AEPB Inhale 1 puff into the lungs daily.    furosemide (LASIX) 20 MG tablet Take 2 tablets daily. You may take an extra dose for increase weight gain orswelling    Glucose Blood (BLOOD GLUCOSE TEST STRIPS) STRP 1 strip by In Vitro route 2 (two) times daily.    insulin aspart (NOVOLOG) 100 UNIT/ML injection Inject 0-9 Units into the skin 3 (three) times daily with meals. Sliding Scale CBG 70 - 120:  0 units  CBG 121 - 150: 1 unit  CBG 151 - 200: 2 units  CBG 201 - 250: 3 units  CBG 251 - 300: 5 units  CBG 301 - 350: 7 units  CBG 351 - 400: 9 units  CBG > 400: give 9 units and call your MD for additional instructions    insulin degludec (TRESIBA FLEXTOUCH) 100 UNIT/ML FlexTouch Pen Inject 7 Units into the skin daily. 04/22/2021: 10 UNITS   levalbuterol (XOPENEX) 0.63 MG/3ML nebulizer solution NEBULIZE 1 VIAL EVERY 4 HOURS AS NEEDED FOR WHEEZING OR SHORTNESS OF BREATH    Naphazoline-Pheniramine  (OPCON-A) 0.027-0.315 % SOLN Place 1 drop into both eyes daily as needed (Burning and itching eyes).    nitroGLYCERIN (NITRODUR - DOSED IN MG/24 HR) 0.2 mg/hr patch Place 1 patch (0.2 mg total) onto the skin daily.    nitroGLYCERIN (NITROSTAT) 0.3 MG SL tablet Place 1 tablet (0.3 mg total) under the tongue every 5 (five) minutes as needed for chest pain.    pantoprazole (PROTONIX) 40 MG tablet Take 1 tablet (40 mg total) by mouth 2 (two) times daily for 20 days.    pentoxifylline (TRENTAL) 400 MG CR tablet TAKE  (1)  TABLET  THREE TIMES DAILY WITH MEALS.    potassium chloride SA (KLOR-CON) 20 MEQ tablet Take 2 tablets (40 mEq total) by mouth as directed. Only when you take Metolazone    sennosides-docusate sodium (SENOKOT-S) 8.6-50 MG tablet Take 1 tablet by mouth daily.    No facility-administered encounter medications on file as of 04/22/2021.    Patient Active Problem List   Diagnosis Date Noted   Acute respiratory failure ([place]) 04/06/2021   Respiratory failure ([place]) 04/06/2021   Syncope 02/19/2021   Acute GI bleeding 02/19/2021   Acute on chronic blood loss anemia 02/19/2021   Acute respiratory failure with hypoxia ([place]) 01/17/2021   Elevated MCV 01/17/2021   Leukocytosis 01/17/2021   CKD (chronic kidney disease), stage III ([place]) 01/17/2021   Hyperglycemia due to diabetes mellitus ([place]) 01/17/2021   Skin abscess 01/15/2020   Depression, recurrent ([place]) 08/08/2019   Cigarette smoker 06/19/2019   Vitamin D deficiency 01/29/2019   Dizziness 12/03/2018   Chronic combined systolic and diastolic CHF (congestive heart failure) ([place]) 12/03/2018   Coronary artery disease involving native coronary artery of native heart with angina pectoris ([place]) 10/04/2018   Hyperlipidemia 10/04/2018   Pain in right hand 10/02/2018   Myalgia due to statin 03/29/2018   Stage 3 severe COPD by GOLD classification ([place]) 10/26/2017   Tobacco abuse 03/07/2016   Type 2 diabetes mellitus ([place]) 08/26/2015    Osteoporosis 09/02/2014   Carotid stenosis 07/22/2012   PVD 07/02/2009   BLADDER CANCER 06/05/2009   Hyperlipidemia associated with type 2 diabetes mellitus ([place]) 06/05/2009   PSEUDOGOUT 06/05/2009   MITRAL VALVE PROLAPSE 06/05/2009    Conditions to be addressed/monitored: CHF and Hospice Services  Care Plan : RNCM: Heart Failure (Adult)  Updates made by Ilean China, RN since 04/25/2021 12:00 AM     Problem: Symptom Exacerbation (Heart Failure)   Priority: Medium     Long-Range Goal: Manage Symptoms and Maintain Comfort   Start Date: 01/31/2021  This Visit's Progress: On track  Recent Progress: On track  Priority: Medium  Note:   Current Barriers:  Chronic Disease Management support and education needs related to CHF in a patient with CAD, COPD, DM, CKD III, and depression  Nurse Case Manager Clinical Goal(s):  patient will work with  [facility] (community agency) to manage  symptoms and comfort  Interventions:  1:1 collaboration with Claretta Fraise, MD regarding development and update of comprehensive plan of care as evidenced by provider attestation and co-signature Inter-disciplinary care team collaboration (see longitudinal plan of care) Chart reviewed including relevant office notes, hospital notes, lab reports, and imaging reports Collaborated with Lottie Dawson, PharmD regarding medication management and CCM status Talked with patient and patient's daughter regarding current status s/p hospitalization Collaborated with [facility] [phone] to confirm that patient enrolled in [place] on 04/16/21 Confirmed with patient that she is receiving aide services through Hospice Discussed lower extremity edema and management. Patient is awaiting a phone call from cardiology with directions on how to main 5lb weight increase overnight. Advised that patient's Care Management/Care Coordination has been turned over to Hospice. Pt opted to keep her  PCP.  Self Care Activities:  Self administers medications as prescribed Attends all scheduled provider appointments Calls pharmacy for medication refills Performs ADL's independently Calls provider office for new concerns or questions  Patient Goals Over the next 180 days, patient will: Keep all medical appointments Call cardiologist with any weight gain of more than 3lbs overnight or 5lbs in one week Work with [facility] regarding Care Management and Care Coordination       Plan: No further follow up required: Patient is enrolled in Hospice with [facility], Copywriter, advertising, RN-BC [place] [facility] [place] Management Direct Dial: [phone]

## 2021-04-25 NOTE — Telephone Encounter (Signed)
Called and spoke with pt's daughter Earlene Plater letting her know that it can take weeks for [place] to get results of sputum cultures back and she verbalized understanding. Pt has a HFU appt Monday, 8/8 with Beth and stated to her to ask Beth then if any results are back. Nothing further needed.

## 2021-05-02 ENCOUNTER — Encounter: Payer: Self-pay | Admitting: *Deleted

## 2021-05-02 ENCOUNTER — Ambulatory Visit: Payer: Medicare Other | Admitting: Orthopedic Surgery

## 2021-05-02 ENCOUNTER — Encounter: Payer: Self-pay | Admitting: Primary Care

## 2021-05-02 ENCOUNTER — Ambulatory Visit (INDEPENDENT_AMBULATORY_CARE_PROVIDER_SITE_OTHER): Payer: Medicare Other

## 2021-05-02 ENCOUNTER — Other Ambulatory Visit: Payer: Self-pay

## 2021-05-02 ENCOUNTER — Ambulatory Visit (INDEPENDENT_AMBULATORY_CARE_PROVIDER_SITE_OTHER): Payer: Medicare Other | Admitting: Primary Care

## 2021-05-02 VITALS — BP 100/60 | HR 100 | Temp 97.6°F | Ht 59.0 in | Wt 115.4 lb

## 2021-05-02 DIAGNOSIS — J441 Chronic obstructive pulmonary disease with (acute) exacerbation: Secondary | ICD-10-CM | POA: Diagnosis not present

## 2021-05-02 DIAGNOSIS — J449 Chronic obstructive pulmonary disease, unspecified: Secondary | ICD-10-CM

## 2021-05-02 DIAGNOSIS — J9691 Respiratory failure, unspecified with hypoxia: Secondary | ICD-10-CM

## 2021-05-02 NOTE — Patient Instructions (Addendum)
  Respiratory sputum culture was normal   Recommendations: - Continue Trelegy 100- take one puff daily in the morning - Use albuterol every 6 hours as needed only for breakthrough shortness of breath/wheezing - Increase mucinex to twice a day - Try again to use nasal spray - Use flutter valve 3 times a day to help loose congestion  Orders: - Simple walk test/qualify for POC - CXR today (ordered) - Flutter valve  - 6MWT   Follow-up: Keep apt in September with Dr. Melvyn Novas

## 2021-05-02 NOTE — Progress Notes (Signed)
$'@Patient'W$  ID: Hannah French, female    DOB: Mar 13, 1941, 80 y.o.   MRN: [id number]  Chief Complaint  Patient presents with   Follow-up    Referring provider: Dettinger, Fransisca Kaufmann, MD  HPI: 5 year of female, current smoker (50 pack year hx). PMH significant for CAD, combined heart failure, stage 3 COPD, respiratory failure, CKD stage 3. Patient if Dr. Melvyn Novas, last seen on 03/01/21 by pulmonary NP.   05/02/2021 Patient presents today for hospital follow-up. She was admitted in mid-July for acute respiratory failure requiring intubation secondary to COPD exacerbation and heart failure. She was treated with 5 day course of azithromycin (along with ceftriaxone, cefepime and aztreonam x 1 day). She was also diuresed. CXR 04/07/21 with slight improvement in mildly prominent interstitial markings. Respiratory culture while in-patient was negative. She was discharged on 2L oxygen.  She is doing better. States that her breathing is "pretty good, better than it was". She has not had any episodes of shortness of breath. She has been using her oxygen 75% of the time while at home. She wears her oxygen it at night. She is compliant with Trelegy 128mg daily. Uses albuterol 1-2 times a day. She has been having some upper airway congestion. She has a difficult time expectorating mucus. When she does it is a yellow color. She is taking mucinex once a day. She does not have a flutter valve. She is taking her diuretics as prescribed. She will be seeing cardiology in August and is being considered for pacermaker with Dr. LLurline Hareon 05/10/21.  Allergies  Allergen Reactions   Contrast Media [Iodinated Diagnostic Agents] Shortness Of Breath   Iohexol Other (See Comments)    PASSED OUT DURING THE TEST     Metformin Swelling    Face and hands became swollen   Statins Swelling and Other (See Comments)    They make the patient hurt "all over" Myalgia, also   Zetia [Ezetimibe] Swelling   Bextra [Valdecoxib]  Nausea And Vomiting and Swelling    Shut down my kidneys    Cefdinir Other (See Comments)    Pancreatitis    Cozaar [Losartan Potassium] Other (See Comments)    dizziness   Gabapentin    Nsaids Other (See Comments)    GI intolerance and pain all over, tolerates ibu    Rofecoxib Other (See Comments)    "VIOXX"- "SHUT DOWN MY KIDNEYS"   Sulfa Antibiotics Nausea And Vomiting and Other (See Comments)    Pancreatitis    Immunization History  Administered Date(s) Administered   Fluad Quad(high Dose 65+) 06/14/2019, 07/22/2020   Influenza Inj Mdck Quad Pf 06/14/2019   Influenza, High Dose Seasonal PF 08/14/2017   Influenza,inj,Quad PF,6+ Mos 08/06/2013, 07/20/2014, 07/30/2015   Influenza-Unspecified 07/05/2018   Moderna Sars-Covid-2 Vaccination 11/06/2019, 12/05/2019, 06/16/2020   Pneumococcal Conjugate-13 09/09/2020   Pneumococcal Polysaccharide-23 08/08/2019   Tdap 03/17/2011   Zoster Recombinat (Shingrix) 11/14/2017, 04/19/2018   Zoster, Live 05/12/2010    Past Medical History:  Diagnosis Date   Abdominal bruit    Bladder [place]    CAD (coronary artery disease)    a. nonobstructive by cath 08/2018.   Carotid bruit    Chronic combined systolic and diastolic CHF (congestive heart failure) (HCC)    CKD (chronic kidney disease), stage II    Congestive heart failure (CHF) (HCC)    Diabetes mellitus    Type II   Dyslipidemia    Heart murmur    Mild pulmonary hypertension ([facility]  Mitral valve prolapse    a. not seen on most recent echoes. Mild MR now.   NICM (nonischemic cardiomyopathy) ([place])    Osteoporosis    Pancreatitis, acute    Pseudogout    PVD (peripheral vascular disease) ([place])    a. 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   Sinus tachycardia    Tobacco abuse     Tobacco History: Social History   Tobacco Use  Smoking Status Every Day   Packs/day: 1.00   Years: 50.00   Pack years: 50.00   Types:  Cigarettes  Smokeless Tobacco Never  Tobacco Comments   still smoking as of 03/01/21   Ready to quit: Not Answered Counseling given: Not Answered Tobacco comments: still smoking as of 03/01/21   Outpatient Medications Prior to Visit  Medication Sig Dispense Refill   acetaminophen (TYLENOL) 500 MG tablet Take 500 mg by mouth every 6 (six) hours as needed for moderate pain.     albuterol (VENTOLIN HFA) 108 (90 Base) MCG/ACT inhaler Inhale 2 puffs into the lungs every 6 (six) hours as needed for wheezing or shortness of breath. 18 g 5   Cholecalciferol (VITAMIN D) 50 MCG (2000 UT) tablet Take 4,000 Units by mouth daily.      clopidogrel (PLAVIX) 75 MG tablet Take 1 tablet (75 mg total) by mouth daily. 90 tablet 2   empagliflozin (JARDIANCE) 10 MG TABS tablet Take 1 tablet (10 mg total) by mouth daily. 30 tablet 0   Fluticasone-Umeclidin-Vilant (TRELEGY ELLIPTA) 100-62.5-25 MCG/INH AEPB Inhale 1 puff into the lungs daily. 60 each 5   furosemide (LASIX) 20 MG tablet Take 2 tablets daily. You may take an extra dose for increase weight gain orswelling 180 tablet 3   Glucose Blood (BLOOD GLUCOSE TEST STRIPS) STRP 1 strip by In Vitro route 2 (two) times daily. 100 strip 3   insulin aspart (NOVOLOG) 100 UNIT/ML injection Inject 0-9 Units into the skin 3 (three) times daily with meals. Sliding Scale CBG 70 - 120: 0 units  CBG 121 - 150: 1 unit  CBG 151 - 200: 2 units  CBG 201 - 250: 3 units  CBG 251 - 300: 5 units  CBG 301 - 350: 7 units  CBG 351 - 400: 9 units  CBG > 400: give 9 units and call your MD for additional instructions 10 mL 1   insulin degludec (TRESIBA FLEXTOUCH) 100 UNIT/ML FlexTouch Pen Inject 7 Units into the skin daily. 9 mL 3   levalbuterol (XOPENEX) 0.63 MG/3ML nebulizer solution Inhale into the lungs.     LORazepam (ATIVAN) 0.5 MG tablet Take 0.5 mg by mouth every 3 (three) hours as needed.     Morphine Sulfate (MORPHINE CONCENTRATE) 10 mg / 0.5 ml concentrated solution 10 mg.      Naphazoline-Pheniramine (OPCON-A) 0.027-0.315 % SOLN Place 1 drop into both eyes daily as needed (Burning and itching eyes).     nitroGLYCERIN (NITRODUR - DOSED IN MG/24 HR) 0.2 mg/hr patch Place 1 patch (0.2 mg total) onto the skin daily. 30 patch 12   nitroGLYCERIN (NITROSTAT) 0.3 MG SL tablet Place 1 tablet (0.3 mg total) under the tongue every 5 (five) minutes as needed for chest pain. 100 tablet 3   pantoprazole (PROTONIX) 40 MG tablet Take 1 tablet (40 mg total) by mouth 2 (two) times daily for 20 days. 40 tablet 1   pentoxifylline (TRENTAL) 400 MG CR tablet TAKE  (1)  TABLET  THREE TIMES DAILY WITH  MEALS. 90 tablet 3   potassium chloride SA (KLOR-CON) 20 MEQ tablet Take 2 tablets (40 mEq total) by mouth as directed. Only when you take Metolazone 10 tablet 0   sennosides-docusate sodium (SENOKOT-S) 8.6-50 MG tablet Take 1 tablet by mouth daily. 30 tablet 5   levalbuterol (XOPENEX) 0.63 MG/3ML nebulizer solution NEBULIZE 1 VIAL EVERY 4 HOURS AS NEEDED FOR WHEEZING OR SHORTNESS OF BREATH 75 mL 11   ferrous sulfate 325 (65 FE) MG tablet Take 1 tablet (325 mg total) by mouth daily with breakfast. 90 tablet 1   No facility-administered medications prior to visit.    Review of Systems  Review of Systems  Constitutional: Negative.   HENT:  Positive for congestion.   Respiratory:  Positive for cough. Negative for chest tightness, shortness of breath and wheezing.     Physical Exam  BP 100/60 (BP Location: Left Arm, Patient Position: Sitting, Cuff Size: Normal)   Pulse 100   Temp 97.6 F (36.4 C) (Oral)   Ht '4\' 11"'$  (1.499 m)   Wt 115 lb 6.4 oz (52.3 kg)   SpO2 94%   BMI 23.31 kg/m  Physical Exam Constitutional:      Appearance: Normal appearance.  HENT:     Head: Normocephalic and atraumatic.     Mouth/Throat:     Mouth: Mucous membranes are moist.     Pharynx: Oropharynx is clear.  Cardiovascular:     Rate and Rhythm: Normal rate and regular rhythm.  Pulmonary:     Effort:  Pulmonary effort is normal.     Breath sounds: Normal breath sounds. No wheezing, rhonchi or rales.     Comments: 3L oxygen Musculoskeletal:        General: Normal range of motion.  Skin:    General: Skin is warm and dry.  Neurological:     General: No focal deficit present.     Mental Status: She is alert and oriented to person, place, and time. Mental status is at baseline.  Psychiatric:        Mood and Affect: Mood normal.        Behavior: Behavior normal.        Thought Content: Thought content normal.     Lab Results:  CBC    Component Value Date/Time   WBC 11.3 (H) 04/15/2021 0958   WBC 12.6 (H) 04/12/2021 0120   RBC 3.66 (L) 04/15/2021 0958   RBC 3.42 (L) 04/12/2021 0120   HGB 11.0 (L) 04/15/2021 0958   HCT 34.6 04/15/2021 0958   PLT 293 04/15/2021 0958   MCV 95 04/15/2021 0958   MCH 30.1 04/15/2021 0958   MCH 31.0 04/12/2021 0120   MCHC 31.8 04/15/2021 0958   MCHC 30.4 04/12/2021 0120   RDW 21.0 (H) 04/15/2021 0958   LYMPHSABS 1.2 04/15/2021 0958   MONOABS 1.0 04/12/2021 0120   EOSABS 0.2 04/15/2021 0958   BASOSABS 0.1 04/15/2021 0958    BMET    Component Value Date/Time   NA 138 04/15/2021 0958   K 4.6 04/15/2021 0958   CL 99 04/15/2021 0958   CO2 23 04/15/2021 0958   GLUCOSE 275 (H) 04/15/2021 0958   GLUCOSE 181 (H) 04/12/2021 0120   BUN 44 (H) 04/15/2021 0958   CREATININE 1.68 (H) 04/15/2021 0958   CREATININE 0.67 02/06/2013 0932   CALCIUM 9.9 04/15/2021 0958   GFRNONAA 30 (L) 04/12/2021 0120   GFRNONAA 88 02/06/2013 0932   GFRAA 44 (L) 09/09/2020 1131   GFRAA >89 02/06/2013  0932    BNP    Component Value Date/Time   BNP 918.0 (H) 04/06/2021 0347    ProBNP    Component Value Date/Time   PROBNP 4,124 (H) 10/04/2018 1257    Imaging: DG Chest 1 View  Result Date: 04/06/2021 CLINICAL DATA:  80 year old female intubated. Shortness of breath and tachycardia. EXAM: CHEST  1 VIEW COMPARISON:  Portable chest 0322 hours today and earlier.  FINDINGS: Portable AP supine view at 0404 hours. Intubated with endotracheal tube tip in good position between the clavicles and carina. Enteric tube courses to the abdomen and side hole is at the level of the gastric fundus. Mediastinal contours are stable and within normal limits. Coarse bilateral pulmonary interstitial opacity persists, progressed since 04/01/2021. No pneumothorax, pleural effusion or consolidation. Stable visualized osseous structures. IMPRESSION: 1. Endotracheal tube and enteric tube in good position. 2. Ongoing coarse bilateral pulmonary interstitial opacity, acute new since 04/01/2021, with differential considerations of viral/atypical pneumonia versus pulmonary edema. Electronically Signed   By: Genevie Ann M.D.   On: 04/06/2021 04:18   DG Chest 2 View  Result Date: 05/02/2021 CLINICAL DATA:  COPD exacerbation.  Follow-up. EXAM: CHEST - 2 VIEW COMPARISON:  04/07/2021 FINDINGS: The heart size and mediastinal contours are within normal limits. Aortic atherosclerosis. Lungs are hyperinflated with coarsened interstitial markings and diffuse bronchial wall thickening. No pleural effusion or edema. Chronic appearing compression deformity within the upper thoracic spine is unchanged. IMPRESSION: No acute cardiopulmonary abnormalities. Signs of COPD/emphysema. Electronically Signed   By: Kerby Moors M.D.   On: 05/02/2021 13:08   US RENAL  Result Date: 04/10/2021 CLINICAL DATA:  Acute renal insufficiency, remote history of bladder cancer EXAM: RENAL / URINARY TRACT ULTRASOUND COMPLETE COMPARISON:  01/14/2021 FINDINGS: Right Kidney: Renal measurements: 8.9 x 3.4 x 3.7 cm = volume: 58.4 mL. Echogenicity within normal limits. No mass or hydronephrosis visualized. Left Kidney: Renal measurements: 11.4 x 4.4 x 5.2 cm = volume: 135.3 mL. Echogenicity within normal limits. Lobular contour of the left kidney is again identified, with duplicated left renal collecting system as described on numerous  previous CT exams. No masses or hydronephrosis visualized. Bladder: Appears normal for degree of bladder distention. Other: None. IMPRESSION: 1. 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 2. Otherwise unremarkable renal ultrasound. Electronically Signed   By: Randa Ngo M.D.   On: 04/10/2021 16:56   DG CHEST PORT 1 VIEW  Result Date: 04/07/2021 CLINICAL DATA:  ET tube EXAM: PORTABLE CHEST 1 VIEW COMPARISON:  None.  April 06, 2021. FINDINGS: Endotracheal tube tip is approximately 4.2 cm above the carina. Left IJ approach central venous catheter with the tip projecting in the region of the superior cavoatrial junction, similar to prior. Gastric tube courses below the diaphragm with the tip outside the field of view. Similar cardiomediastinal silhouette. Aortic atherosclerosis. Suspected slight improvement in now mild prominent interstitial markings, similar to the prior. No new consolidation. No visible pleural effusions or pneumothorax on this single AP semierect radiograph. No visible pleural effusions or pneumothorax. No acute osseous abnormality. IMPRESSION: 1. Suspected slight improvement in mildly prominent interstitial markings. 2. Support devices as detailed above. Electronically Signed   By: Margaretha Sheffield MD   On: 04/07/2021 09:07   DG CHEST PORT 1 VIEW  Result Date: 04/06/2021 CLINICAL DATA:  Central line placement. EXAM: PORTABLE CHEST 1 VIEW COMPARISON:  April 06, 2021. FINDINGS: LEFT-sided IJ central venous line terminates at  the caval to atrial junction. Endotracheal tube terminates approximately 3.6 cm above the carina. Gastric tube with side port below GE junction, tip off the field of view. Cardiomediastinal contours and hilar structures are stable. Structure overlies the LEFT upper chest in there is also likely in associated skin fold in this area. No sign of pneumothorax. No effusion on frontal radiograph. Increased  interstitial markings may be slightly improved compared with previous imaging accounting for differences in technique. On limited assessment no acute skeletal process. IMPRESSION: 1. LEFT IJ central venous line terminates at the caval to atrial junction. No sign of pneumothorax. 2. Additional support apparatus without change. 3. Potential improvement with respect to interstitial markings accounting for differences in technique, attention on follow-up. Electronically Signed   By: Zetta Bills M.D.   On: 04/06/2021 12:40   DG Chest Portable 1 View  Result Date: 04/06/2021 CLINICAL DATA:  Shortness of breath, confusion, tachycardia EXAM: PORTABLE CHEST 1 VIEW COMPARISON:  Radiograph 04/01/2021 FINDINGS: Markedly increased coarse reticulonodular opacities are present throughout both lungs with indistinct pulmonary vascularity. No pneumothorax or visible effusion. No focal consolidative process is seen. Stable cardiomediastinal contours with a calcified aorta. No acute osseous or soft tissue abnormality. IMPRESSION: Interval development of diffuse heterogeneous reticular opacities throughout the lungs with peripheral septal thickening. Rapid development favoring pulmonary edema versus atypical infection/interstitial inflammation. Aortic Atherosclerosis (ICD10-I70.0). Electronically Signed   By: Lovena Le M.D.   On: 04/06/2021 03:31   ECHOCARDIOGRAM COMPLETE  Result Date: 04/06/2021    ECHOCARDIOGRAM REPORT   Patient Name:   CIAN BALDASSARRE Date of Exam: 04/06/2021 Medical Rec #:  [id number]              Height:       59.0 in Accession #:    [id number]             Weight:       112.4 lb Date of Birth:  October 08, 1940              BSA:          1.444 m Patient Age:    41 years               BP:           88/48 mmHg Patient Gender: F                      HR:           85 bpm. Exam Location:  Inpatient Procedure: 2D Echo, Cardiac Doppler and Color Doppler Indications:    CHF  History:        Patient has prior  history of Echocardiogram examinations, most                 recent 10/21/2020. CHF and Cardiomyopathy, CAD, PAD and COPD,                 Signs/Symptoms:Shortness of Breath and CKD; Risk Factors:Current                 Smoker, Diabetes and Hypertension.  Sonographer:    Dustin Flock Referring Phys: [id number] [place]  1. Left ventricular ejection fraction, by estimation, is 25 to 30%. The left ventricle has severely decreased function. The left ventricle demonstrates regional wall motion abnormalities with severe septal hypokinesis and basal to mid inferior akinesis.  There is septal-lateral dyssynchrony consistent with LBBB. Left ventricular diastolic parameters are consistent with  Grade II diastolic dysfunction (pseudonormalization).  2. Right ventricular systolic function is mildly reduced. The right ventricular size is normal. Tricuspid regurgitation signal is inadequate for assessing PA pressure.  3. Left atrial size was mildly dilated.  4. The mitral valve is normal in structure. Trivial mitral valve regurgitation. No evidence of mitral stenosis.  5. The aortic valve is tricuspid. Aortic valve regurgitation is not visualized. Mild aortic valve sclerosis is present, with no evidence of aortic valve stenosis.  6. The inferior vena cava is normal in size with <50% respiratory variability, suggesting right atrial pressure of 8 mmHg. FINDINGS  Left Ventricle: Left ventricular ejection fraction, by estimation, is 25 to 30%. The left ventricle has severely decreased function. The left ventricle demonstrates regional wall motion abnormalities. The left ventricular internal cavity size was normal  in size. There is no left ventricular hypertrophy. Left ventricular diastolic parameters are consistent with Grade II diastolic dysfunction (pseudonormalization). Right Ventricle: The right ventricular size is normal. No increase in right ventricular wall thickness. Right ventricular systolic function is  mildly reduced. Tricuspid regurgitation signal is inadequate for assessing PA pressure. Left Atrium: Left atrial size was mildly dilated. Right Atrium: Right atrial size was normal in size. Pericardium: There is no evidence of pericardial effusion. Mitral Valve: The mitral valve is normal in structure. Mild mitral annular calcification. Trivial mitral valve regurgitation. No evidence of mitral valve stenosis. Tricuspid Valve: The tricuspid valve is normal in structure. Tricuspid valve regurgitation is trivial. Aortic Valve: The aortic valve is tricuspid. Aortic valve regurgitation is not visualized. Mild aortic valve sclerosis is present, with no evidence of aortic valve stenosis. Aortic valve peak gradient measures 8.6 mmHg. Pulmonic Valve: The pulmonic valve was normal in structure. Pulmonic valve regurgitation is not visualized. Aorta: The aortic root is normal in size and structure. Venous: The inferior vena cava is normal in size with less than 50% respiratory variability, suggesting right atrial pressure of 8 mmHg. IAS/Shunts: No atrial level shunt detected by color flow Doppler.  LEFT VENTRICLE PLAX 2D LVIDd:         5.00 cm  Diastology LVIDs:         3.80 cm  LV e' medial:    5.22 cm/s LV PW:         1.10 cm  LV E/e' medial:  16.5 LV IVS:        1.10 cm  LV e' lateral:   5.33 cm/s LVOT diam:     1.90 cm  LV E/e' lateral: 16.2 LV SV:         72 LV SV Index:   50 LVOT Area:     2.84 cm  RIGHT VENTRICLE RV Basal diam:  2.70 cm RV S prime:     7.18 cm/s TAPSE (M-mode): 1.3 cm LEFT ATRIUM             Index       RIGHT ATRIUM           Index LA diam:        3.50 cm 2.42 cm/m  RA Area:     11.60 cm LA Vol (A2C):   41.7 ml 28.88 ml/m RA Volume:   25.50 ml  17.66 ml/m LA Vol (A4C):   40.1 ml 27.77 ml/m LA Biplane Vol: 42.8 ml 29.64 ml/m  AORTIC VALVE AV Area (Vmax): 2.14 cm AV Vmax:        147.00 cm/s AV Peak Grad:   8.6 mmHg LVOT Vmax:  111.00 cm/s LVOT Vmean:     73.600 cm/s LVOT VTI:       0.253 m   AORTA Ao Root diam: 2.40 cm MITRAL VALVE MV Area (PHT): 5.97 cm    SHUNTS MV Decel Time: 127 msec    Systemic VTI:  0.25 m MV E velocity: 86.30 cm/s  Systemic Diam: 1.90 cm MV A velocity: 79.60 cm/s MV E/A ratio:  1.08 Loralie Champagne MD Electronically signed by Loralie Champagne MD Signature Date/Time: 04/06/2021/4:22:38 PM    Final      Assessment & Plan:   Stage 3 severe COPD by GOLD classification ([facility]) - Admitted in mid-July for COPD exacerbation requiring intubation. Treated with 5 days course of azithromycin (and ceftriaxone, cefepime and aztreonam x 1 day). She continues to improve clinically, she has some upper airway congestion. CXR today showed no acute cardiopulmonary abnormality. Chronic COPD/emphysema changes. Recommend increasing mucinex to twice a day and adding flutter valve. She will follow-up in September with Dr. Melvyn Novas.  Recommendations:  - Continue Trelegy 170mg- take one puff daily in the morning - Use albuterol every 6 hours as needed only for breakthrough shortness of breath/wheezing - Increase mucinex to twice a day - Use flutter valve 3 times a day to help loose congestion  Respiratory failure ([place] - Discharged in July 2022 on 2L oxygen; she desaturated to 86% on RA and required 3L supplemental O2 to maintain >90%. She qualified for POC today, DME placed     EMartyn Ehrich NP 05/02/2021

## 2021-05-02 NOTE — Assessment & Plan Note (Addendum)
-   Discharged in July 2022 on 2L oxygen; she desaturated to 86% on RA and required 3L supplemental O2 to maintain >90%. She qualified for POC today, DME placed

## 2021-05-02 NOTE — Progress Notes (Signed)
Please let patient know CXR showed no acute findings. She has chroinc signs of COPD/emphysema, along with Aortic atherosclerosis

## 2021-05-02 NOTE — Assessment & Plan Note (Addendum)
-   Admitted in mid-July for COPD exacerbation requiring intubation. Treated with 5 days course of azithromycin (and ceftriaxone, cefepime and aztreonam x 1 day). She continues to improve clinically, she has some upper airway congestion. CXR today showed no acute cardiopulmonary abnormality. Chronic COPD/emphysema changes. Recommend increasing mucinex to twice a day and adding flutter valve. She will follow-up in September with Dr. Melvyn Novas.  Recommendations:  - Continue Trelegy 13mg- take one puff daily in the morning - Use albuterol every 6 hours as needed only for breakthrough shortness of breath/wheezing - Increase mucinex to twice a day - Use flutter valve 3 times a day to help loose congestion

## 2021-05-03 ENCOUNTER — Telehealth: Payer: Self-pay | Admitting: *Deleted

## 2021-05-03 ENCOUNTER — Other Ambulatory Visit: Payer: Self-pay | Admitting: Family

## 2021-05-03 DIAGNOSIS — J9690 Respiratory failure, unspecified, unspecified whether with hypoxia or hypercapnia: Secondary | ICD-10-CM | POA: Diagnosis not present

## 2021-05-03 DIAGNOSIS — I5042 Chronic combined systolic (congestive) and diastolic (congestive) heart failure: Secondary | ICD-10-CM | POA: Diagnosis not present

## 2021-05-03 DIAGNOSIS — R531 Weakness: Secondary | ICD-10-CM | POA: Diagnosis not present

## 2021-05-03 DIAGNOSIS — J9600 Acute respiratory failure, unspecified whether with hypoxia or hypercapnia: Secondary | ICD-10-CM | POA: Diagnosis not present

## 2021-05-03 MED ORDER — ONDANSETRON HCL 4 MG PO TABS: 4.0000 mg | ORAL_TABLET | ORAL | 2 refills | Status: AC | PRN

## 2021-05-03 MED ORDER — DM-GUAIFENESIN ER 30-600 MG PO TB12: 2.0000 | ORAL_TABLET | Freq: Two times a day (BID) | ORAL | 2 refills | Status: AC | PRN

## 2021-05-03 NOTE — Telephone Encounter (Signed)
Call from [place] w/ Hospice Pt is needing 2 medications Zofran 4 mg 1 Q4 hr prn for intermittent nausea she is having through the day Mucinex DM guaifenesin 1200 mg / dextromethorphan 60 mg dose 1 BID Please send to [facility]

## 2021-05-03 NOTE — Progress Notes (Signed)
Prescription sent to pharmacy.

## 2021-05-04 ENCOUNTER — Telehealth (HOSPITAL_COMMUNITY): Payer: Self-pay | Admitting: *Deleted

## 2021-05-04 ENCOUNTER — Encounter (HOSPITAL_COMMUNITY): Payer: Self-pay | Admitting: *Deleted

## 2021-05-04 NOTE — Telephone Encounter (Signed)
Called pt no answer/left vm requesting return call.  

## 2021-05-04 NOTE — Telephone Encounter (Signed)
Sent message via mychart

## 2021-05-04 NOTE — Telephone Encounter (Signed)
Pt left VM stating her weight is up 6lbs. Pt denies shortness of breath. Pt does notice some swelling in ankles. Pt asked if she needed to increase any meds today.  Routed to [employer] for advice

## 2021-05-04 NOTE — Telephone Encounter (Signed)
She has not taken any extra lasix. Pt said metolazone helped last time.

## 2021-05-05 ENCOUNTER — Encounter: Payer: Self-pay | Admitting: Orthopedic Surgery

## 2021-05-05 ENCOUNTER — Ambulatory Visit: Admitting: Physician Assistant

## 2021-05-05 DIAGNOSIS — I96 Gangrene, not elsewhere classified: Secondary | ICD-10-CM

## 2021-05-05 NOTE — Progress Notes (Signed)
Office Visit Note   Patient: Hannah French           Date of Birth: 1941/02/16           MRN: [id number] Visit Date: 05/05/2021              Requested by: Dettinger, Fransisca Kaufmann, MD [place],  [place] [id number] PCP: Dettinger, Fransisca Kaufmann, MD  Chief Complaint  Patient presents with   Right Foot - Follow-up   Left Foot - Follow-up      HPI: Patient presents in follow-up today she has a history of ischemic toes.  She comes in periodically to have these checks.  She does take Trental and use nitroglycerin patches.  She was recently hospitalized for congestive heart failure.  After this hospitalization she has discontinued smoking she gets some stinging in the toes but no other complaints  Assessment & Plan: Visit Diagnoses: No diagnosis found.  Plan: Continue with the medications follow-up in 6 weeks sooner if any concerns  Follow-Up Instructions: No follow-ups on file.   Ortho Exam  Patient is alert, oriented, no adenopathy, well-dressed, normal affect, normal respiratory effort. Examination of her feet she has slight discoloration of the feet but no ischemic changes.  No swelling no cellulitis or signs of infection.  She does have biphasic dorsalis pedis pulses  Imaging: No results found. No images are attached to the encounter.  Labs: Lab Results  Component Value Date   HGBA1C 8.1 (H) 04/06/2021   HGBA1C 9.8 (H) 01/17/2021   HGBA1C 8.1 (H) 12/09/2020   LABURIC 9.0 (H) 10/08/2020   REPTSTATUS 04/09/2021 FINAL 04/06/2021   GRAMSTAIN NO WBC SEEN NO ORGANISMS SEEN  04/06/2021   CULT  04/06/2021    RARE Normal respiratory flora-no Staph aureus or Pseudomonas seen Performed at [facility], [place] [address] [place], [place] [address]      Lab Results  Component Value Date   ALBUMIN 4.2 04/15/2021   ALBUMIN 3.4 (L) 04/12/2021   ALBUMIN 3.2 (L) 04/11/2021    Lab Results  Component Value Date   MG 2.2 04/12/2021   MG 2.0 04/11/2021   MG 2.4  04/07/2021   Lab Results  Component Value Date   VD25OH 103.0 (H) 02/17/2020   VD25OH 54.9 08/08/2019   VD25OH 57.2 01/29/2019    No results found for: PREALBUMIN CBC EXTENDED Latest Ref Rng & Units 04/15/2021 04/12/2021 04/11/2021  WBC 3.4 - 10.8 x10E3/uL 11.3(H) 12.6(H) 11.8(H)  RBC 3.77 - 5.28 x10E6/uL 3.66(L) 3.42(L) 3.31(L)  HGB 11.1 - 15.9 g/dL 11.0(L) 10.6(L) 10.4(L)  HCT 34.0 - 46.6 % 34.6 34.9(L) 33.8(L)  PLT 150 - 450 x10E3/uL 293 284 289  NEUTROABS 1.4 - 7.0 x10E3/uL 8.8(H) 8.9(H) 8.2(H)  LYMPHSABS 0.7 - 3.1 x10E3/uL 1.2 1.9 1.8     There is no height or weight on file to calculate BMI.  Orders:  No orders of the defined types were placed in this encounter.  No orders of the defined types were placed in this encounter.    Procedures: No procedures performed  Clinical Data: No additional findings.  ROS:  All other systems negative, except as noted in the HPI. Review of Systems  Objective: Vital Signs: There were no vitals taken for this visit.  Specialty Comments:  No specialty comments available.  PMFS History: Patient Active Problem List   Diagnosis Date Noted   Acute respiratory failure ([place]) 04/06/2021   Respiratory failure ([place]) 04/06/2021  Syncope 02/19/2021   Acute GI bleeding 02/19/2021   Acute on chronic blood loss anemia 02/19/2021   Acute respiratory failure with hypoxia ([place]) 01/17/2021   Elevated MCV 01/17/2021   Leukocytosis 01/17/2021   CKD (chronic kidney disease), stage III ([place]) 01/17/2021   Hyperglycemia due to diabetes mellitus ([place]) 01/17/2021   Skin abscess 01/15/2020   Depression, recurrent ([place]) 08/08/2019   Cigarette smoker 06/19/2019   Vitamin D deficiency 01/29/2019   Dizziness 12/03/2018   Chronic combined systolic and diastolic CHF (congestive heart failure) ([place]) 12/03/2018   Coronary artery disease involving native coronary artery of native heart with angina pectoris ([place]) 10/04/2018   Hyperlipidemia 10/04/2018    Pain in right hand 10/02/2018   Myalgia due to statin 03/29/2018   Stage 3 severe COPD by GOLD classification ([place]) 10/26/2017   Tobacco abuse 03/07/2016   Type 2 diabetes mellitus ([place]) 08/26/2015   Osteoporosis 09/02/2014   Carotid stenosis 07/22/2012   PVD 07/02/2009   BLADDER CANCER 06/05/2009   Hyperlipidemia associated with type 2 diabetes mellitus ([place]) 06/05/2009   PSEUDOGOUT 06/05/2009   MITRAL VALVE PROLAPSE 06/05/2009   Past Medical History:  Diagnosis Date   Abdominal bruit    Bladder cancer ([place])    CAD (coronary artery disease)    a. nonobstructive by cath 08/2018.   Carotid bruit    Chronic combined systolic and diastolic CHF (congestive heart failure) (HCC)    CKD (chronic kidney disease), stage II    Congestive heart failure (CHF) (HCC)    Diabetes mellitus    Type II   Dyslipidemia    Heart murmur    Mild pulmonary hypertension (HCC)    Mitral valve prolapse    a. not seen on most recent echoes. Mild MR now.   NICM (nonischemic cardiomyopathy) ([place])    Osteoporosis    Pancreatitis, acute    Pseudogout    PVD (peripheral vascular disease) ([place])    a. 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   Sinus tachycardia    Tobacco abuse     Family History  Problem Relation Age of Onset   Heart failure Mother    Hypertension Sister    Heart attack Brother    Heart attack Maternal Uncle    Heart disease Sister    COPD Brother    Congestive Heart Failure Brother    Congestive Heart Failure Daughter    Rheum arthritis Son    Ovarian cancer Sister    Coronary artery disease Neg Hx        Early    Past Surgical History:  Procedure Laterality Date   ABDOMINAL AORTOGRAM W/LOWER EXTREMITY N/A 12/31/2019   Procedure: ABDOMINAL AORTOGRAM W/LOWER EXTREMITY;  Surgeon: Wellington Hampshire, MD;  Location: [facility];  Service: Cardiovascular;  Laterality: N/A;   ABDOMINAL AORTOGRAM W/LOWER EXTREMITY N/A 09/01/2020    Procedure: ABDOMINAL AORTOGRAM W/LOWER EXTREMITY;  Surgeon: Wellington Hampshire, MD;  Location: [facility];  Service: Cardiovascular;  Laterality: N/A;   APPENDECTOMY     CORONARY STENT INTERVENTION N/A 10/13/2020   Procedure: CORONARY STENT INTERVENTION;  Surgeon: Leonie Man, MD;  Location: [facility];  Service: Cardiovascular;  Laterality: N/A;   Hysterectomy-type unspecified     INTRAVASCULAR PRESSURE WIRE/FFR STUDY N/A 10/13/2020   Procedure: INTRAVASCULAR PRESSURE WIRE/FFR STUDY;  Surgeon: Leonie Man, MD;  Location: [facility];  Service: Cardiovascular;  Laterality: N/A;   LAPAROSCOPIC CHOLECYSTECTOMY  2009  PERIPHERAL VASCULAR INTERVENTION  12/31/2019   Procedure: PERIPHERAL VASCULAR INTERVENTION;  Surgeon: Wellington Hampshire, MD;  Location: [facility];  Service: Cardiovascular;;  Bilateral Iliacs   RIGHT/LEFT HEART CATH AND CORONARY ANGIOGRAPHY N/A 09/16/2018   Procedure: RIGHT/LEFT HEART CATH AND CORONARY ANGIOGRAPHY;  Surgeon: Sherren Mocha, MD;  Location: [facility];  Service: Cardiovascular;  Laterality: N/A;   RIGHT/LEFT HEART CATH AND CORONARY ANGIOGRAPHY N/A 10/13/2020   Procedure: RIGHT/LEFT HEART CATH AND CORONARY ANGIOGRAPHY;  Surgeon: Jolaine Artist, MD;  Location: [facility];  Service: Cardiovascular;  Laterality: N/A;   Social History   Occupational History   Occupation: Retired  Tobacco Use   Smoking status: Every Day    Packs/day: 1.00    Years: 50.00    Pack years: 50.00    Types: Cigarettes   Smokeless tobacco: Never   Tobacco comments:    still smoking as of 03/01/21  Vaping Use   Vaping Use: Never used  Substance and Sexual Activity   Alcohol use: No   Drug use: No   Sexual activity: Not Currently

## 2021-05-07 DIAGNOSIS — R739 Hyperglycemia, unspecified: Secondary | ICD-10-CM | POA: Diagnosis not present

## 2021-05-07 DIAGNOSIS — I959 Hypotension, unspecified: Secondary | ICD-10-CM | POA: Diagnosis not present

## 2021-05-10 ENCOUNTER — Other Ambulatory Visit: Payer: Self-pay

## 2021-05-10 ENCOUNTER — Encounter: Payer: Self-pay | Admitting: Cardiology

## 2021-05-10 ENCOUNTER — Ambulatory Visit (INDEPENDENT_AMBULATORY_CARE_PROVIDER_SITE_OTHER): Payer: Medicare Other | Admitting: Cardiology

## 2021-05-10 VITALS — BP 112/52 | HR 103 | Ht 59.0 in | Wt 118.0 lb

## 2021-05-10 DIAGNOSIS — I5042 Chronic combined systolic (congestive) and diastolic (congestive) heart failure: Secondary | ICD-10-CM | POA: Diagnosis not present

## 2021-05-10 DIAGNOSIS — I447 Left bundle-branch block, unspecified: Secondary | ICD-10-CM

## 2021-05-10 DIAGNOSIS — I5022 Chronic systolic (congestive) heart failure: Secondary | ICD-10-CM

## 2021-05-10 DIAGNOSIS — I10 Essential (primary) hypertension: Secondary | ICD-10-CM | POA: Diagnosis not present

## 2021-05-10 NOTE — Progress Notes (Signed)
Electrophysiology Office Note:    Date:  05/10/2021   ID:  Hannah French, DOB [birth year], MRN [id number]  PCP:  Dettinger, Fransisca Kaufmann, MD  [facility] Cardiologist:  Glori Bickers, MD  [facility] Electrophysiologist:  Vickie Epley, MD   Referring MD: Dettinger, Fransisca Kaufmann, MD   Chief Complaint: Nonischemic cardiomyopathy  History of Present Illness:    Hannah French is a 80 y.o. female who presents for an evaluation of nonischemic cardiomyopathy at the request of Dr. Haroldine Laws. Their medical history includes chronic combined systolic and diastolic heart failure, CKD 2, diabetes, mild pulmonary hypertension, peripheral vascular disease.  The patient was seen by Dr. Haroldine Laws on April 12, 2021 while the patient was hospitalized for acute hypoxic respiratory failure.  This was thought to be secondary to acute on chronic heart failure, COPD exacerbation and a possible superimposed pneumonia.  She was treated with antibiotics, steroids and diuresis.  The patient has a left bundle branch block.  The patient is being referred for consideration of CRT.  The patient uses oxygen supplementation. She is using a rollator today in clinic.  Past Medical History:  Diagnosis Date   Abdominal bruit    Bladder cancer ([place])    CAD (coronary artery disease)    a. nonobstructive by cath 08/2018.   Carotid bruit    Chronic combined systolic and diastolic CHF (congestive heart failure) (HCC)    CKD (chronic kidney disease), stage II    Congestive heart failure (CHF) (HCC)    Diabetes mellitus    Type II   Dyslipidemia    Heart murmur    Mild pulmonary hypertension (HCC)    Mitral valve prolapse    a. not seen on most recent echoes. Mild MR now.   NICM (nonischemic cardiomyopathy) ([place])    Osteoporosis    Pancreatitis, acute    Pseudogout    PVD (peripheral vascular disease) ([place])    a. 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    Sinus tachycardia    Tobacco abuse     Past Surgical History:  Procedure Laterality Date   ABDOMINAL AORTOGRAM W/LOWER EXTREMITY N/A 12/31/2019   Procedure: ABDOMINAL AORTOGRAM W/LOWER EXTREMITY;  Surgeon: Wellington Hampshire, MD;  Location: [facility];  Service: Cardiovascular;  Laterality: N/A;   ABDOMINAL AORTOGRAM W/LOWER EXTREMITY N/A 09/01/2020   Procedure: ABDOMINAL AORTOGRAM W/LOWER EXTREMITY;  Surgeon: Wellington Hampshire, MD;  Location: [facility];  Service: Cardiovascular;  Laterality: N/A;   APPENDECTOMY     CORONARY STENT INTERVENTION N/A 10/13/2020   Procedure: CORONARY STENT INTERVENTION;  Surgeon: Leonie Man, MD;  Location: [facility];  Service: Cardiovascular;  Laterality: N/A;   Hysterectomy-type unspecified     INTRAVASCULAR PRESSURE WIRE/FFR STUDY N/A 10/13/2020   Procedure: INTRAVASCULAR PRESSURE WIRE/FFR STUDY;  Surgeon: Leonie Man, MD;  Location: [facility];  Service: Cardiovascular;  Laterality: N/A;   LAPAROSCOPIC CHOLECYSTECTOMY  2009   PERIPHERAL VASCULAR INTERVENTION  12/31/2019   Procedure: PERIPHERAL VASCULAR INTERVENTION;  Surgeon: Wellington Hampshire, MD;  Location: [facility];  Service: Cardiovascular;;  Bilateral Iliacs   RIGHT/LEFT HEART CATH AND CORONARY ANGIOGRAPHY N/A 09/16/2018   Procedure: RIGHT/LEFT HEART CATH AND CORONARY ANGIOGRAPHY;  Surgeon: Sherren Mocha, MD;  Location: [facility];  Service: Cardiovascular;  Laterality: N/A;   RIGHT/LEFT HEART CATH AND CORONARY ANGIOGRAPHY N/A 10/13/2020   Procedure: RIGHT/LEFT HEART CATH AND CORONARY ANGIOGRAPHY;  Surgeon: Jolaine Artist, MD;  Location: [facility];  Service: Cardiovascular;  Laterality: N/A;    Current Medications: Current Meds  Medication Sig   acetaminophen (TYLENOL) 500 MG tablet Take 500 mg by mouth every 6 (six) hours as needed for moderate pain.   albuterol (VENTOLIN HFA) 108 (90 Base) MCG/ACT inhaler Inhale 2 puffs into  the lungs every 6 (six) hours as needed for wheezing or shortness of breath.   Cholecalciferol (VITAMIN D) 50 MCG (2000 UT) tablet Take 4,000 Units by mouth daily.    clopidogrel (PLAVIX) 75 MG tablet Take 1 tablet (75 mg total) by mouth daily.   dextromethorphan-guaiFENesin (MUCINEX DM) 30-600 MG 12hr tablet Take 2 tablets by mouth 2 (two) times daily as needed for cough.   empagliflozin (JARDIANCE) 10 MG TABS tablet Take 1 tablet (10 mg total) by mouth daily.   ferrous sulfate 325 (65 FE) MG tablet Take 1 tablet (325 mg total) by mouth daily with breakfast.   Fluticasone-Umeclidin-Vilant (TRELEGY ELLIPTA) 100-62.5-25 MCG/INH AEPB Inhale 1 puff into the lungs daily.   furosemide (LASIX) 20 MG tablet Take 2 tablets daily. You may take an extra dose for increase weight gain orswelling   Glucose Blood (BLOOD GLUCOSE TEST STRIPS) STRP 1 strip by In Vitro route 2 (two) times daily.   insulin aspart (NOVOLOG) 100 UNIT/ML injection Inject 0-9 Units into the skin 3 (three) times daily with meals. Sliding Scale CBG 70 - 120: 0 units  CBG 121 - 150: 1 unit  CBG 151 - 200: 2 units  CBG 201 - 250: 3 units  CBG 251 - 300: 5 units  CBG 301 - 350: 7 units  CBG 351 - 400: 9 units  CBG > 400: give 9 units and call your MD for additional instructions   insulin degludec (TRESIBA FLEXTOUCH) 100 UNIT/ML FlexTouch Pen Inject 7 Units into the skin daily.   levalbuterol (XOPENEX) 0.63 MG/3ML nebulizer solution Inhale into the lungs.   LORazepam (ATIVAN) 0.5 MG tablet Take 0.5 mg by mouth every 3 (three) hours as needed.   Morphine Sulfate (MORPHINE CONCENTRATE) 10 mg / 0.5 ml concentrated solution 10 mg.   Naphazoline-Pheniramine (OPCON-A) 0.027-0.315 % SOLN Place 1 drop into both eyes daily as needed (Burning and itching eyes).   nitroGLYCERIN (NITRODUR - DOSED IN MG/24 HR) 0.2 mg/hr patch Place 1 patch (0.2 mg total) onto the skin daily.   nitroGLYCERIN (NITROSTAT) 0.3 MG SL tablet Place 1 tablet (0.3 mg total)  under the tongue every 5 (five) minutes as needed for chest pain.   ondansetron (ZOFRAN) 4 MG tablet Take 1 tablet (4 mg total) by mouth every 4 (four) hours as needed for nausea or vomiting.   pantoprazole (PROTONIX) 40 MG tablet Take 1 tablet (40 mg total) by mouth 2 (two) times daily for 20 days.   pentoxifylline (TRENTAL) 400 MG CR tablet TAKE  (1)  TABLET  THREE TIMES DAILY WITH MEALS.   potassium chloride SA (KLOR-CON) 20 MEQ tablet Take 2 tablets (40 mEq total) by mouth as directed. Only when you take Metolazone   sennosides-docusate sodium (SENOKOT-S) 8.6-50 MG tablet Take 1 tablet by mouth daily.     Allergies:   Contrast media [iodinated diagnostic agents], Iohexol, Metformin, Statins, Zetia [ezetimibe], Bextra [valdecoxib], Cefdinir, Cozaar [losartan potassium], Gabapentin, Nsaids, Rofecoxib, and Sulfa antibiotics   Social History   Socioeconomic History   Marital status: Divorced    Spouse name: Not on file   Number of children: 2  Years of education: Some College    Highest education level: Some college, no degree  Occupational History   Occupation: Retired  Tobacco Use   Smoking status: Former    Packs/day: 1.00    Years: 50.00    Pack years: 50.00    Types: Cigarettes   Smokeless tobacco: Never   Tobacco comments:    still smoking as of 03/01/21  Vaping Use   Vaping Use: Never used  Substance and Sexual Activity   Alcohol use: No   Drug use: No   Sexual activity: Not Currently  Other Topics Concern   Not on file  Social History Narrative   Not on file   Social Determinants of Health   Financial Resource Strain: Not on file  Food Insecurity: No Food Insecurity   Worried About [place] in the Last Year: Never true   [place] in the Last Year: Never true  Transportation Needs: No Transportation Needs   Lack of Transportation (Medical): No   Lack of Transportation (Non-Medical): No  Physical Activity: Not on file  Stress: Not on file   Social Connections: Not on file     Family History: [name] family history includes COPD in her brother; Congestive Heart Failure in her brother and daughter; Heart attack in her brother and maternal uncle; Heart disease in her sister; Heart failure in her mother; Hypertension in her sister; Ovarian cancer in her sister; Rheum arthritis in her son. There is no history of Coronary artery disease.  ROS:   Please see the history of present illness.    All other systems reviewed and are negative.  EKGs/Labs/Other Studies Reviewed:    The following studies were reviewed today: Hospital records  April 06, 2021 echo personally reviewed Left ventricular function severely decreased, 25% Abnormal septal motion consistent with left bundle RV function mildly reduced  April 06, 2021 EKG Incomplete left bundle branch block Sinus rhythm   EKG:  The ekg ordered today demonstrates QRS duration 136m. Sinus rhythm.  Recent Labs: 04/06/2021: B Natriuretic Peptide 918.0 04/12/2021: Magnesium 2.2 04/15/2021: ALT 25; BUN 44; Creatinine, Ser 1.68; Hemoglobin 11.0; Platelets 293; Potassium 4.6; Sodium 138  Recent Lipid Panel    Component Value Date/Time   CHOL 157 02/17/2020 1159   TRIG 171 (H) 04/06/2021 0534   TRIG 82 06/09/2014 1028   HDL 41 02/17/2020 1159   HDL 57 06/09/2014 1028   CHOLHDL 3.8 02/17/2020 1159   CHOLHDL 3.5 02/06/2013 0932   VLDL 20 02/06/2013 0932   LDLCALC 93 02/17/2020 1159   LDLCALC 131 (H) 06/09/2014 1028    Physical Exam:    VS:  BP (!) 112/52   Pulse (!) 103   Ht '4\' 11"'$  (1.499 m)   Wt 118 lb (53.5 kg)   SpO2 99%   BMI 23.83 kg/m     Wt Readings from Last 3 Encounters:  05/10/21 118 lb (53.5 kg)  05/02/21 115 lb 6.4 oz (52.3 kg)  04/12/21 116 lb 6.5 oz (52.8 kg)     GEN: Chronically ill appearing, frail with supplemental oxygen.thin. HEENT: Normal NECK: No JVD; No carotid bruits LYMPHATICS: No lymphadenopathy CARDIAC: RRR, no murmurs, rubs,  gallops RESPIRATORY:  Clear to auscultation without rales, wheezing or rhonchi  ABDOMEN: Soft, non-tender, non-distended MUSCULOSKELETAL:  No edema; No deformity  SKIN: Warm and dry NEUROLOGIC:  Alert and oriented x 3 PSYCHIATRIC:  Normal affect   ASSESSMENT:    1. Chronic combined systolic and diastolic  CHF (congestive heart failure) ([place])   2. LBBB (left bundle branch block)   3. Primary hypertension    PLAN:    In order of problems listed above:  1. Chronic combined systolic and diastolic CHF (congestive heart failure) (HCC)  NYHA II-III. Warm and relatively euvolemic today on my exam. On jardiance, lasix. I was asked to weigh in re: CRT candidacy. Unfortunately she has a fairly narrow QRS making the likelihood of CRT response very low. Additionally, I think she is fairly high risk for undergoing any procedure. I would recommend continued medical therapy.  2. LBBB (left bundle branch block) See above.  3. Primary hypertension At goal today.   4. CAD Continue plavix.    Total time spent with patient today 46 minutes. This includes reviewing records, evaluating the patient and coordinating care.  Medication Adjustments/Labs and Tests Ordered: Current medicines are reviewed at length with the patient today.  Concerns regarding medicines are outlined above.  Orders Placed This Encounter  Procedures   EKG 12-Lead    No orders of the defined types were placed in this encounter.    Signed, Hilton Cork. Quentin Ore, MD, [facility], [facility] 05/10/2021 9:48 PM    Electrophysiology [facility]

## 2021-05-10 NOTE — Patient Instructions (Addendum)
Medication Instructions:  Increase Lasix to 60 mg daily for the next 3 days. Take Metolazone daily for the next 3 days with Potassium.   Your physician recommends that you continue on your current medications as directed. Please refer to the Current Medication list given to you today.  Labwork: None ordered.  Testing/Procedures: None ordered.  Follow-Up: Your physician wants you to follow-up in: As needed with Lars Mage, MD   Any Other Special Instructions Will Be Listed Below (If Applicable).  If you need a refill on your cardiac medications before your next appointment, please call your pharmacy.

## 2021-05-13 ENCOUNTER — Telehealth: Payer: Self-pay | Admitting: Cardiology

## 2021-05-13 DIAGNOSIS — J9690 Respiratory failure, unspecified, unspecified whether with hypoxia or hypercapnia: Secondary | ICD-10-CM | POA: Diagnosis not present

## 2021-05-13 DIAGNOSIS — R531 Weakness: Secondary | ICD-10-CM | POA: Diagnosis not present

## 2021-05-13 DIAGNOSIS — J9600 Acute respiratory failure, unspecified whether with hypoxia or hypercapnia: Secondary | ICD-10-CM | POA: Diagnosis not present

## 2021-05-13 DIAGNOSIS — J449 Chronic obstructive pulmonary disease, unspecified: Secondary | ICD-10-CM | POA: Diagnosis not present

## 2021-05-13 NOTE — Telephone Encounter (Signed)
Estill Bamberg RN with home health care and who cares for the pt, just wanted to confirm updated med list with our office, based on when Dr. Quentin Ore saw the pt in clinic on 8/16. Went over his assessment and plan on the pt from 8/16 and AVS with Estill Bamberg on the phone.  Estill Bamberg verbalized understanding and agrees with this plan.  Estill Bamberg confirmed everything matches.  Estill Bamberg was gracious for all the assistance provided.

## 2021-05-13 NOTE — Telephone Encounter (Signed)
Pt c/o medication issue:  1. Name of Medication: Lasix , metolazone   2. How are you currently taking this medication (dosage and times per day)? Once a day  3. Are you having a reaction (difficulty breathing--STAT)? No   4. What is your medication issue? Hospices nurse would like to know the correct dosages

## 2021-05-16 ENCOUNTER — Encounter (HOSPITAL_COMMUNITY): Payer: Self-pay | Admitting: Internal Medicine

## 2021-05-16 ENCOUNTER — Ambulatory Visit (HOSPITAL_COMMUNITY)
Admission: RE | Admit: 2021-05-16 | Discharge: 2021-05-16 | Disposition: A | Discharge status: Home / Self Care | Payer: Medicare Other | Source: Ambulatory Visit | Attending: Internal Medicine | Admitting: Internal Medicine

## 2021-05-16 ENCOUNTER — Other Ambulatory Visit: Payer: Self-pay

## 2021-05-16 VITALS — BP 88/50 | HR 93 | Wt 114.2 lb

## 2021-05-16 DIAGNOSIS — I428 Other cardiomyopathies: Secondary | ICD-10-CM | POA: Diagnosis not present

## 2021-05-16 DIAGNOSIS — Z8249 Family history of ischemic heart disease and other diseases of the circulatory system: Secondary | ICD-10-CM | POA: Insufficient documentation

## 2021-05-16 DIAGNOSIS — F17210 Nicotine dependence, cigarettes, uncomplicated: Secondary | ICD-10-CM | POA: Insufficient documentation

## 2021-05-16 DIAGNOSIS — Z7902 Long term (current) use of antithrombotics/antiplatelets: Secondary | ICD-10-CM | POA: Diagnosis not present

## 2021-05-16 DIAGNOSIS — I110 Hypertensive heart disease with heart failure: Secondary | ICD-10-CM | POA: Insufficient documentation

## 2021-05-16 DIAGNOSIS — Z794 Long term (current) use of insulin: Secondary | ICD-10-CM | POA: Diagnosis not present

## 2021-05-16 DIAGNOSIS — E785 Hyperlipidemia, unspecified: Secondary | ICD-10-CM | POA: Diagnosis not present

## 2021-05-16 DIAGNOSIS — E1142 Type 2 diabetes mellitus with diabetic polyneuropathy: Secondary | ICD-10-CM | POA: Diagnosis not present

## 2021-05-16 DIAGNOSIS — E1165 Type 2 diabetes mellitus with hyperglycemia: Secondary | ICD-10-CM | POA: Diagnosis not present

## 2021-05-16 DIAGNOSIS — Z79899 Other long term (current) drug therapy: Secondary | ICD-10-CM | POA: Diagnosis not present

## 2021-05-16 DIAGNOSIS — Z720 Tobacco use: Secondary | ICD-10-CM | POA: Diagnosis not present

## 2021-05-16 DIAGNOSIS — Z7951 Long term (current) use of inhaled steroids: Secondary | ICD-10-CM | POA: Diagnosis not present

## 2021-05-16 DIAGNOSIS — I2510 Atherosclerotic heart disease of native coronary artery without angina pectoris: Secondary | ICD-10-CM | POA: Diagnosis not present

## 2021-05-16 DIAGNOSIS — J449 Chronic obstructive pulmonary disease, unspecified: Secondary | ICD-10-CM | POA: Insufficient documentation

## 2021-05-16 DIAGNOSIS — I5042 Chronic combined systolic (congestive) and diastolic (congestive) heart failure: Secondary | ICD-10-CM | POA: Diagnosis not present

## 2021-05-16 LAB — CBC
HCT: 33.2 % — ABNORMAL LOW (ref 36.0–46.0)
Hemoglobin: 10.6 g/dL — ABNORMAL LOW (ref 12.0–15.0)
MCH: 32.1 pg (ref 26.0–34.0)
MCHC: 31.9 g/dL (ref 30.0–36.0)
MCV: 100.6 fL — ABNORMAL HIGH (ref 80.0–100.0)
Platelets: 387 10*3/uL (ref 150–400)
RBC: 3.3 MIL/uL — ABNORMAL LOW (ref 3.87–5.11)
RDW: 22.3 % — ABNORMAL HIGH (ref 11.5–15.5)
WBC: 11.3 10*3/uL — ABNORMAL HIGH (ref 4.0–10.5)
nRBC: 0.2 % (ref 0.0–0.2)

## 2021-05-16 LAB — BASIC METABOLIC PANEL
Anion gap: 12 (ref 5–15)
BUN: 37 mg/dL — ABNORMAL HIGH (ref 8–23)
CO2: 34 mmol/L — ABNORMAL HIGH (ref 22–32)
Calcium: 9.8 mg/dL (ref 8.9–10.3)
Chloride: 94 mmol/L — ABNORMAL LOW (ref 98–111)
Creatinine, Ser: 1.51 mg/dL — ABNORMAL HIGH (ref 0.44–1.00)
GFR, Estimated: 35 mL/min — ABNORMAL LOW (ref >60)
Glucose, Bld: 141 mg/dL — ABNORMAL HIGH (ref 70–99)
Potassium: 4.3 mmol/L (ref 3.5–5.1)
Sodium: 140 mmol/L (ref 135–145)

## 2021-05-16 LAB — BRAIN NATRIURETIC PEPTIDE: B Natriuretic Peptide: 327.2 pg/mL — ABNORMAL HIGH (ref 0.0–100.0)

## 2021-05-16 MED ORDER — TORSEMIDE 40 MG PO TABS: 40.0000 mg | ORAL_TABLET | Freq: Every day | ORAL | 3 refills | Status: DC

## 2021-05-16 NOTE — Progress Notes (Signed)
Advanced Heart Failure Clinic Note   PCP: Primary Cardiologist: Dr. 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   Saw Dr. Burt Knack 12/19 due to SOB. EKG showed new LBBB.An echo was done which showed new onset LV dysfunction EF 30-35%.  Underwent R/L cath which showed nonobstructive CAD    Her medical therapy has been limited by low BP and dizziness (beta blocker reduced, losartan stopped). Corlanor was started for elevated HR and soft BP but not yet approved, F/u echo 04/28/19 showed EF 25-30%, diffuse HK, Grade II DD,  mildly dilated LA, mild MR.    Underwent stenting of bilateral common iliac artery stent placement extending into the distal aorta on 12/31/19   Seen in HF Clinic c/o CP -> cath 1/22 showed 75% lesion LAD -> PCI mLAD.   Echo 1/22 EF 40-45%.  She was admitted on 04/06/2021 with acute on chronic respiratory failure with hypoxia secondary to acute on chronic CHF with COPD exacerbation and superimposed pneumonia. Required mechanical ventilation and briefly BP support with NE. Marland Kitchen  Course complicated by AKI, creatinine peaked at 2.37 with baseline of 1.   Diursed with IV lasix and later po lasix. Echo during admit with EF 25-30%, mildly reduced RV function, + dyssynchrony. GDMT limited by hypotension. Cardiomyopathy felt to be possibly due to LBBB. Was seen by Dr. Quentin Ore on 08/16, QRS not wide enough for CRT and felt to be high risk for any procedures.  Recently diuretics increased by HF clinic and she also tells me she was instructed to take metolazone x 3 days but I don't see any record of this.   She is here today for follow-up with her daughter. Continues to feel weak. Has Hospice RN coming out every Tuesday. Fluid up and down. Doesn't feel like lasix is doing the trick. SBP remains low at times. Denies CP. SOB with  minimal exertion. No edema, orthopnea or PND currently. Has stopped smoking.   ROS: All systems negative except as listed in HPI, PMH and Problem List.  SH:  Social History   Socioeconomic History   Marital status: Divorced    Spouse name: Not on file   Number of children: 2   Years of education: Some College    Highest education level: Some college, no degree  Occupational History   Occupation: Retired  Tobacco Use   Smoking status: Former    Packs/day: 1.00    Years: 50.00    Pack years: 50.00    Types: Cigarettes   Smokeless tobacco: Never   Tobacco comments:    still smoking as of 03/01/21  Vaping Use   Vaping Use: Never used  Substance and Sexual Activity   Alcohol use: No   Drug use: No   Sexual activity: Not Currently  Other Topics Concern   Not on file  Social History Narrative   Not on file   Social Determinants of Health   Financial Resource Strain: Not on file  Food Insecurity: No Food Insecurity   Worried About [place] in the Last Year: Never true   [place] in the Last Year: Never true  Transportation Needs: No Transportation Needs   Lack of Transportation (Medical): No   Lack of Transportation (Non-Medical): No  Physical Activity: Not on file  Stress: Not on file  Social  Connections: Not on file  Intimate Partner Violence: Not on file    FH:  Family History  Problem Relation Age of Onset   Heart failure Mother    Hypertension Sister    Heart attack Brother    Heart attack Maternal Uncle    Heart disease Sister    COPD Brother    Congestive Heart Failure Brother    Congestive Heart Failure Daughter    Rheum arthritis Son    Ovarian cancer Sister    Coronary artery disease Neg Hx        Early    Past Medical History:  Diagnosis Date   Abdominal bruit    Bladder cancer ([place])    CAD (coronary artery disease)    a. nonobstructive by cath 08/2018.   Carotid bruit    Chronic combined systolic and diastolic CHF  (congestive heart failure) (HCC)    CKD (chronic kidney disease), stage II    Congestive heart failure (CHF) (HCC)    Diabetes mellitus    Type II   Dyslipidemia    Heart murmur    Mild pulmonary hypertension (HCC)    Mitral valve prolapse    a. not seen on most recent echoes. Mild MR now.   NICM (nonischemic cardiomyopathy) ([place])    Osteoporosis    Pancreatitis, acute    Pseudogout    PVD (peripheral vascular disease) ([place])    a. 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   Sinus tachycardia    Tobacco abuse     Current Outpatient Medications  Medication Sig Dispense Refill   acetaminophen (TYLENOL) 500 MG tablet Take 500 mg by mouth every 6 (six) hours as needed for moderate pain.     albuterol (VENTOLIN HFA) 108 (90 Base) MCG/ACT inhaler Inhale 2 puffs into the lungs every 6 (six) hours as needed for wheezing or shortness of breath. 18 g 5   Cholecalciferol (VITAMIN D) 50 MCG (2000 UT) tablet Take 4,000 Units by mouth daily.      clopidogrel (PLAVIX) 75 MG tablet Take 1 tablet (75 mg total) by mouth daily. 90 tablet 2   dextromethorphan-guaiFENesin (MUCINEX DM) 30-600 MG 12hr tablet Take 2 tablets by mouth 2 (two) times daily as needed for cough. 120 tablet 2   Fluticasone-Umeclidin-Vilant (TRELEGY ELLIPTA) 100-62.5-25 MCG/INH AEPB Inhale 1 puff into the lungs daily. 60 each 5   furosemide (LASIX) 20 MG tablet Take 2 tablets daily. You may take an extra dose for increase weight gain orswelling 180 tablet 3   Glucose Blood (BLOOD GLUCOSE TEST STRIPS) STRP 1 strip by In Vitro route 2 (two) times daily. 100 strip 3   insulin aspart (NOVOLOG) 100 UNIT/ML injection Inject 0-9 Units into the skin 3 (three) times daily with meals. Sliding Scale CBG 70 - 120: 0 units  CBG 121 - 150: 1 unit  CBG 151 - 200: 2 units  CBG 201 - 250: 3 units  CBG 251 - 300: 5 units  CBG 301 - 350: 7 units  CBG 351 - 400: 9 units  CBG > 400: give 9 units and call  your MD for additional instructions 10 mL 1   insulin degludec (TRESIBA FLEXTOUCH) 100 UNIT/ML FlexTouch Pen Inject 7 Units into the skin daily. 9 mL 3   levalbuterol (XOPENEX) 0.63 MG/3ML nebulizer solution Inhale into the lungs.     LORazepam (ATIVAN) 0.5 MG tablet Take 0.5 mg by mouth every 3 (three) hours as needed.  Morphine Sulfate (MORPHINE CONCENTRATE) 10 mg / 0.5 ml concentrated solution 10 mg.     Naphazoline-Pheniramine (OPCON-A) 0.027-0.315 % SOLN Place 1 drop into both eyes daily as needed (Burning and itching eyes).     nitroGLYCERIN (NITRODUR - DOSED IN MG/24 HR) 0.2 mg/hr patch Place 1 patch (0.2 mg total) onto the skin daily. 30 patch 12   nitroGLYCERIN (NITROSTAT) 0.3 MG SL tablet Place 1 tablet (0.3 mg total) under the tongue every 5 (five) minutes as needed for chest pain. 100 tablet 3   ondansetron (ZOFRAN) 4 MG tablet Take 1 tablet (4 mg total) by mouth every 4 (four) hours as needed for nausea or vomiting. 90 tablet 2   OXYGEN Inhale 2 Doses into the lungs continuous. 2 liters n/c     pentoxifylline (TRENTAL) 400 MG CR tablet TAKE  (1)  TABLET  THREE TIMES DAILY WITH MEALS. 90 tablet 3   potassium chloride SA (KLOR-CON) 20 MEQ tablet Take 2 tablets (40 mEq total) by mouth as directed. Only when you take Metolazone 10 tablet 0   sennosides-docusate sodium (SENOKOT-S) 8.6-50 MG tablet Take 1 tablet by mouth daily. 30 tablet 5   No current facility-administered medications for this encounter.    Vitals:   05/16/21 1408  BP: (!) 88/50  Pulse: 93  SpO2: 100%  Weight: 51.8 kg (114 lb 3.2 oz)    PHYSICAL EXAM:  General:  Weak. Chronically-ill appearing. No resp difficulty HEENT: normal Neck: supple. JVP 6 Carotids 2+ bilat; no bruits. No lymphadenopathy or thryomegaly appreciated. Cor: PMI nondisplaced. Regular rate & rhythm. No rubs, gallops or murmurs. Lungs: clear markedly decreased throughout Abdomen: soft, nontender, nondistended. No hepatosplenomegaly. No  bruits or masses. Good bowel sounds. Extremities: no cyanosis, clubbing, rash, edema Neuro: alert & orientedx3, cranial nerves grossly intact. moves all 4 extremities w/o difficulty. Affect pleasant   ASSESSMENT & PLAN:   1. CAD - Due to NICM (? LBBB but not very wide)  - Echo 12/19 EF 30-35% with diffuse hypokinesis. Cath 12/19 nonobstructive CAD. - Echo 8/20 EF 25-30% with Grade II DD. - Echo 1/22 EF 40-45%  - Echo 7/22 EF 25-30% - Remains tenuous overall. NYHA III-IIIB. - Volume status overall looks good but says she just took 3 days of metolazone. Now looks a bit dry.  - Will change lasix 40 daily to torsemide 40 daily. Goal weight likely 115-116 (she is 113-114 today) -  Intolerant of meds due to low BP and dizziness (She does have a BP differential between arms so should likely be using the right arm to obtain accurate readings) - Off b-blocker due to low BP - Off digoxin due to high level - Losartan stopped due to low BP and hyperkalemia. - Jardiance recently held. Will restart at next visit if volume status tolerates - No room to titrate GDMT - PFTs quite severe and likely major limiting factor  - Has seen Dr. Quentin Ore and not candidate for CRT upgrade - Continue Hospice support - will have her f/u in 2-3 weeks to make sure volume status ok. Check labs today.   2. CAD - 1/19/22S/P DES mid LAD lesion.  -Continue Rivaroxaban + Clopidogrel '75mg'$  daily for 12 months . If there are significant bleeding complications can stop clopidogrel  after 6 months. - No s/s angina - Intolerant of statins. On repatha   3. 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 Fletcher Anon - Now following with Dr. Sharol Given for toe  ulcers - She is s/p stenting of bilateral common iliac artery stent placement extending into the distal aorta.  - Unable to take gabapentin. . - On Xarelto - Discussed need to stop smoking    5. COPD with ongoing tobacco use. - PFTs 9/20 with  severe COPD. - has now quit   6. DM2 - Off Jardiance as above - Followed by PCP.  Glori Bickers, MD  2:48 PM

## 2021-05-16 NOTE — Patient Instructions (Signed)
Labs done today. We will contact you only if your labs are abnormal.  STOP taking Lasix.  START Torsemide '40mg'$  (1 tablet) by mouth daily.   No other medication changes were made. Please continue all current medications as prescribed.  Your physician recommends that you schedule a follow-up appointment in: 2-3 weeks with our APP Clinic   If you have any questions or concerns before your next appointment please send [place] a message through [place] or call our office at [phone].    TO LEAVE A MESSAGE FOR THE NURSE SELECT OPTION 2, PLEASE LEAVE A MESSAGE INCLUDING: YOUR NAME DATE OF BIRTH CALL BACK NUMBER REASON FOR CALL**this is important as we prioritize the call backs  YOU WILL RECEIVE A CALL BACK THE SAME DAY AS LONG AS YOU CALL BEFORE 4:00 PM   Do the following things EVERYDAY: Weigh yourself in the morning before breakfast. Write it down and keep it in a log. Take your medicines as prescribed Eat low salt foods--Limit salt (sodium) to 2000 mg per day.  Stay as active as you can everyday Limit all fluids for the day to less than 2 liters   At the [facility], you and your health needs are our priority. As part of our continuing mission to provide you with exceptional heart care, we have created designated Provider Care Teams. These Care Teams include your primary Cardiologist (physician) and Advanced Practice Providers (APPs- Physician Assistants and Nurse Practitioners) who all work together to provide you with the care you need, when you need it.   You may see any of the following providers on your designated Care Team at your next follow up: Dr Glori Bickers Dr Haynes Kerns, NP Lyda Jester, [place] Audry Riles, PharmD   Please be sure to bring in all your medications bottles to every appointment.

## 2021-06-01 ENCOUNTER — Encounter (HOSPITAL_COMMUNITY)

## 2021-06-03 ENCOUNTER — Ambulatory Visit: Payer: Medicare Other | Admitting: Internal Medicine

## 2021-06-06 ENCOUNTER — Ambulatory Visit (INDEPENDENT_AMBULATORY_CARE_PROVIDER_SITE_OTHER): Admitting: Internal Medicine

## 2021-06-06 ENCOUNTER — Encounter: Payer: Self-pay | Admitting: Internal Medicine

## 2021-06-06 ENCOUNTER — Other Ambulatory Visit: Payer: Self-pay

## 2021-06-06 ENCOUNTER — Ambulatory Visit (HOSPITAL_COMMUNITY)
Admission: RE | Admit: 2021-06-06 | Discharge: 2021-06-06 | Disposition: A | Discharge status: Home / Self Care | Payer: Medicare Other | Source: Ambulatory Visit | Attending: Family Medicine | Admitting: Family Medicine

## 2021-06-06 ENCOUNTER — Encounter (HOSPITAL_COMMUNITY): Payer: Self-pay

## 2021-06-06 VITALS — BP 100/56 | HR 89 | Wt 116.2 lb

## 2021-06-06 DIAGNOSIS — E1159 Type 2 diabetes mellitus with other circulatory complications: Secondary | ICD-10-CM

## 2021-06-06 DIAGNOSIS — Z7984 Long term (current) use of oral hypoglycemic drugs: Secondary | ICD-10-CM | POA: Diagnosis not present

## 2021-06-06 DIAGNOSIS — J9611 Chronic respiratory failure with hypoxia: Secondary | ICD-10-CM

## 2021-06-06 DIAGNOSIS — I6522 Occlusion and stenosis of left carotid artery: Secondary | ICD-10-CM | POA: Insufficient documentation

## 2021-06-06 DIAGNOSIS — Z79899 Other long term (current) drug therapy: Secondary | ICD-10-CM | POA: Insufficient documentation

## 2021-06-06 DIAGNOSIS — N189 Chronic kidney disease, unspecified: Secondary | ICD-10-CM | POA: Insufficient documentation

## 2021-06-06 DIAGNOSIS — I2510 Atherosclerotic heart disease of native coronary artery without angina pectoris: Secondary | ICD-10-CM | POA: Insufficient documentation

## 2021-06-06 DIAGNOSIS — I110 Hypertensive heart disease with heart failure: Secondary | ICD-10-CM | POA: Insufficient documentation

## 2021-06-06 DIAGNOSIS — K219 Gastro-esophageal reflux disease without esophagitis: Secondary | ICD-10-CM | POA: Diagnosis not present

## 2021-06-06 DIAGNOSIS — I447 Left bundle-branch block, unspecified: Secondary | ICD-10-CM | POA: Insufficient documentation

## 2021-06-06 DIAGNOSIS — E1151 Type 2 diabetes mellitus with diabetic peripheral angiopathy without gangrene: Secondary | ICD-10-CM | POA: Insufficient documentation

## 2021-06-06 DIAGNOSIS — J449 Chronic obstructive pulmonary disease, unspecified: Secondary | ICD-10-CM | POA: Diagnosis not present

## 2021-06-06 DIAGNOSIS — I739 Peripheral vascular disease, unspecified: Secondary | ICD-10-CM

## 2021-06-06 DIAGNOSIS — Z9981 Dependence on supplemental oxygen: Secondary | ICD-10-CM | POA: Insufficient documentation

## 2021-06-06 DIAGNOSIS — J9612 Chronic respiratory failure with hypercapnia: Secondary | ICD-10-CM

## 2021-06-06 DIAGNOSIS — I5042 Chronic combined systolic (congestive) and diastolic (congestive) heart failure: Secondary | ICD-10-CM | POA: Diagnosis not present

## 2021-06-06 DIAGNOSIS — Z95820 Peripheral vascular angioplasty status with implants and grafts: Secondary | ICD-10-CM | POA: Diagnosis not present

## 2021-06-06 DIAGNOSIS — I130 Hypertensive heart and chronic kidney disease with heart failure and stage 1 through stage 4 chronic kidney disease, or unspecified chronic kidney disease: Secondary | ICD-10-CM | POA: Diagnosis not present

## 2021-06-06 DIAGNOSIS — Z87891 Personal history of nicotine dependence: Secondary | ICD-10-CM | POA: Insufficient documentation

## 2021-06-06 DIAGNOSIS — Z794 Long term (current) use of insulin: Secondary | ICD-10-CM

## 2021-06-06 DIAGNOSIS — R0602 Shortness of breath: Secondary | ICD-10-CM | POA: Diagnosis not present

## 2021-06-06 LAB — BASIC METABOLIC PANEL
Anion gap: 14 (ref 5–15)
BUN: 36 mg/dL — ABNORMAL HIGH (ref 8–23)
CO2: 31 mmol/L (ref 22–32)
Calcium: 9.2 mg/dL (ref 8.9–10.3)
Chloride: 95 mmol/L — ABNORMAL LOW (ref 98–111)
Creatinine, Ser: 1.4 mg/dL — ABNORMAL HIGH (ref 0.44–1.00)
GFR, Estimated: 38 mL/min — ABNORMAL LOW (ref >60)
Glucose, Bld: 73 mg/dL (ref 70–99)
Potassium: 3.6 mmol/L (ref 3.5–5.1)
Sodium: 140 mmol/L (ref 135–145)

## 2021-06-06 LAB — BRAIN NATRIURETIC PEPTIDE: B Natriuretic Peptide: 339.5 pg/mL — ABNORMAL HIGH (ref 0.0–100.0)

## 2021-06-06 MED ORDER — TORSEMIDE 40 MG PO TABS: 60.0000 mg | ORAL_TABLET | Freq: Every day | ORAL | 3 refills | Status: AC

## 2021-06-06 MED ORDER — PREDNISONE 10 MG PO TABS: ORAL_TABLET | ORAL | 0 refills | Status: DC

## 2021-06-06 MED ORDER — YUPELRI 175 MCG/3ML IN SOLN: 175.0000 ug | Freq: Every day | RESPIRATORY_TRACT | 11 refills | Status: DC

## 2021-06-06 MED ORDER — YUPELRI 175 MCG/3ML IN SOLN: 175.0000 ug | Freq: Every day | RESPIRATORY_TRACT | 0 refills | Status: DC

## 2021-06-06 NOTE — Progress Notes (Signed)
Advanced Heart Failure Clinic Note   PCP: Dettinger, Fransisca Kaufmann, MD HF Cardiologist: Dr. Haroldine Laws  HPI:  Hannah French is a pleasant 80 y.o. 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   Saw Dr. Burt Knack 12/19 due to SOB. EKG showed new LBBB.An echo was done which showed new onset LV dysfunction EF 30-35%.  Underwent R/L cath which showed nonobstructive CAD    Her medical therapy has been limited by low BP and dizziness (beta blocker reduced, losartan stopped). Corlanor was started for elevated HR and soft BP but not yet approved, F/u echo 04/28/19 showed EF 25-30%, diffuse HK, Grade II DD,  mildly dilated LA, mild MR.    Underwent stenting of bilateral common iliac artery stent placement extending into the distal aorta on 12/31/19   Seen in HF Clinic c/o CP -> cath 1/22 showed 75% lesion LAD -> PCI mLAD.   Echo 1/22 EF 40-45%.  Admitted on 04/06/2021 with a/c respiratory failure with hypoxia secondary to a/c CHF with COPD exacerbation and superimposed pneumonia. Required mechanical ventilation and briefly BP support with NE. Course complicated by AKI, creatinine peaked at 2.37 with baseline of 1.   Diursed with IV lasix and later po lasix. Echo during admit with EF 25-30%, mildly reduced RV function, + dyssynchrony. GDMT limited by hypotension. Cardiomyopathy felt to be possibly due to LBBB. Was seen by Dr. Quentin Ore on 08/16, QRS not wide enough for CRT and felt to be high risk for any procedures.  Recently diuretics increased by HF clinic 05/04/21 and she says she was instructed to take metolazone x 3 days, but I don't see any record of this.   Follow up 05/16/21 she remained SOB and weak.BP occasionally low. Lasix stopped, torsemide started.   Today she returns for HF follow up with her daughter. She feels fatigued and SOB today. She is SOB with minimal exertion. Notices legs swelling  more and nauseated this weekend, also with some dizziness. No falls. Able to get around some with her cane in the house. Denies CP or PND/Orthopnea. Appetite ok. No fever or chills. Weight at home 116 pounds. Taking all medications. Hospice RN comes out every Tuesday. She and her daughter tell me she never stopped taking her Jardiance.  ROS: All systems negative except as listed in HPI, PMH and Problem List.  SH:  Social History   Socioeconomic History   Marital status: Divorced    Spouse name: Not on file   Number of children: 2   Years of education: Some College    Highest education level: Some college, no degree  Occupational History   Occupation: Retired  Tobacco Use   Smoking status: Former    Packs/day: 1.00    Years: 50.00    Pack years: 50.00    Types: Cigarettes   Smokeless tobacco: Never   Tobacco comments:    still smoking as of 03/01/21  Vaping Use   Vaping Use: Never used  Substance and Sexual Activity   Alcohol use: No   Drug use: No   Sexual activity: Not Currently  Other Topics Concern   Not on file  Social History Narrative   Not on file   Social Determinants of Health   Financial Resource Strain: Not on file  Food Insecurity: No Food Insecurity   Worried About [place] in the Last Year: Never true   Ran Out of  Food in the Last Year: Never true  Transportation Needs: No Transportation Needs   Lack of Transportation (Medical): No   Lack of Transportation (Non-Medical): No  Physical Activity: Not on file  Stress: Not on file  Social Connections: Not on file  Intimate Partner Violence: Not on file   FH:  Family History  Problem Relation Age of Onset   Heart failure Mother    Hypertension Sister    Heart attack Brother    Heart attack Maternal Uncle    Heart disease Sister    COPD Brother    Congestive Heart Failure Brother    Congestive Heart Failure Daughter    Rheum arthritis Son    Ovarian cancer Sister    Coronary artery disease  Neg Hx        Early    Past Medical History:  Diagnosis Date   Abdominal bruit    Bladder cancer ([place])    CAD (coronary artery disease)    a. nonobstructive by cath 08/2018.   Carotid bruit    Chronic combined systolic and diastolic CHF (congestive heart failure) (HCC)    CKD (chronic kidney disease), stage II    Congestive heart failure (CHF) (HCC)    Diabetes mellitus    Type II   Dyslipidemia    Heart murmur    Mild pulmonary hypertension (HCC)    Mitral valve prolapse    a. not seen on most recent echoes. Mild MR now.   NICM (nonischemic cardiomyopathy) ([place])    Osteoporosis    Pancreatitis, acute    Pseudogout    PVD (peripheral vascular disease) ([place])    a. 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   Sinus tachycardia    Tobacco abuse     Current Outpatient Medications  Medication Sig Dispense Refill   acetaminophen (TYLENOL) 500 MG tablet Take 500 mg by mouth every 6 (six) hours as needed for moderate pain.     albuterol (VENTOLIN HFA) 108 (90 Base) MCG/ACT inhaler Inhale 2 puffs into the lungs every 6 (six) hours as needed for wheezing or shortness of breath. 18 g 5   Cholecalciferol (VITAMIN D) 50 MCG (2000 UT) tablet Take 4,000 Units by mouth daily.      clopidogrel (PLAVIX) 75 MG tablet Take 1 tablet (75 mg total) by mouth daily. 90 tablet 2   dextromethorphan-guaiFENesin (MUCINEX DM) 30-600 MG 12hr tablet Take 2 tablets by mouth 2 (two) times daily as needed for cough. 120 tablet 2   Fluticasone-Umeclidin-Vilant (TRELEGY ELLIPTA) 100-62.5-25 MCG/INH AEPB Inhale 1 puff into the lungs daily. 60 each 5   furosemide (LASIX) 20 MG tablet Take 2 tablets daily. You may take an extra dose for increase weight gain orswelling 180 tablet 3   Glucose Blood (BLOOD GLUCOSE TEST STRIPS) STRP 1 strip by In Vitro route 2 (two) times daily. 100 strip 3   insulin aspart (NOVOLOG) 100 UNIT/ML injection Inject 0-9 Units into the skin 3  (three) times daily with meals. Sliding Scale CBG 70 - 120: 0 units  CBG 121 - 150: 1 unit  CBG 151 - 200: 2 units  CBG 201 - 250: 3 units  CBG 251 - 300: 5 units  CBG 301 - 350: 7 units  CBG 351 - 400: 9 units  CBG > 400: give 9 units and call your MD for additional instructions 10 mL 1   insulin degludec (TRESIBA FLEXTOUCH) 100 UNIT/ML FlexTouch Pen Inject 7 Units into the skin  daily. 9 mL 3   levalbuterol (XOPENEX) 0.63 MG/3ML nebulizer solution Inhale into the lungs.     LORazepam (ATIVAN) 0.5 MG tablet Take 0.5 mg by mouth every 3 (three) hours as needed.     Morphine Sulfate (MORPHINE CONCENTRATE) 10 mg / 0.5 ml concentrated solution 10 mg.     Naphazoline-Pheniramine (OPCON-A) 0.027-0.315 % SOLN Place 1 drop into both eyes daily as needed (Burning and itching eyes).     nitroGLYCERIN (NITRODUR - DOSED IN MG/24 HR) 0.2 mg/hr patch Place 1 patch (0.2 mg total) onto the skin daily. 30 patch 12   nitroGLYCERIN (NITROSTAT) 0.3 MG SL tablet Place 1 tablet (0.3 mg total) under the tongue every 5 (five) minutes as needed for chest pain. 100 tablet 3   ondansetron (ZOFRAN) 4 MG tablet Take 1 tablet (4 mg total) by mouth every 4 (four) hours as needed for nausea or vomiting. 90 tablet 2   OXYGEN Inhale 2 Doses into the lungs continuous. 2 liters n/c     pentoxifylline (TRENTAL) 400 MG CR tablet TAKE  (1)  TABLET  THREE TIMES DAILY WITH MEALS. 90 tablet 3   potassium chloride SA (KLOR-CON) 20 MEQ tablet Take 2 tablets (40 mEq total) by mouth as directed. Only when you take Metolazone 10 tablet 0   sennosides-docusate sodium (SENOKOT-S) 8.6-50 MG tablet Take 1 tablet by mouth daily. 30 tablet 5   No current facility-administered medications for this encounter.   BP (!) 100/56   Pulse 89   Wt 52.7 kg (116 lb 3.2 oz)   SpO2 100%   BMI 23.47 kg/m   Wt Readings from Last 3 Encounters:  06/06/21 52.7 kg (116 lb 3.2 oz)  05/16/21 51.8 kg (114 lb 3.2 oz)  05/10/21 53.5 kg (118 lb)   PHYSICAL  EXAM: General:  NAD. No resp difficulty, weak, chronically-ill appearing on oxygen, arrived in wheelchair HEENT: Normal Neck: Supple. No JVD. Carotids 2+ bilat; no bruits. No lymphadenopathy or thryomegaly appreciated. Cor: PMI nondisplaced. Regular rate & rhythm. No rubs, gallops or murmurs. Lungs: Clear, decreased BS throughout Abdomen: Soft, nontender, nondistended. No hepatosplenomegaly. No bruits or masses. Good bowel sounds. Extremities: No cyanosis, clubbing, rash, 1+ LE edema; R wrist splint in place. Neuro: Alert & oriented x 3, cranial nerves grossly intact. Moves all 4 extremities w/o difficulty. Affect pleasant.  ReDs: 39%  ASSESSMENT & PLAN:   1. Chronic Systolic HF - Due to NICM (? LBBB but not very wide)  - Echo 12/19 EF 30-35% with diffuse hypokinesis. Cath 12/19 nonobstructive CAD. - Echo 8/20 EF 25-30% with Grade II DD. - Echo 1/22 EF 40-45%  - Echo 7/22 EF 25-30% - Remains tenuous overall. NYHA III-IIIB, functional class difficult due to COPD and physical deconditioning. Volume appears mildly elevated on exam and by ReDs. - Increase torsemide to 60 mg daily. Goal weight likely 115-116 lbs. - Continue Jardiance 10 mg daily. - No room to titrate GDMT. - Intolerant of meds due to low BP and dizziness (She does have a BP differential between arms so should be using the right arm to obtain accurate readings). - Off b-blocker due to low BP. - Off digoxin due to high level. - Off losartan due to low BP and hyperkalemia. - PFTs quite severe and likely major limiting factor  - Has seen Dr. Quentin Ore and not candidate for CRT upgrade. - Continue Hospice support. - BMET & BNP today; repeat BMET in 10 days.  2. CAD - 1/19/22S/P DES mid  LAD lesion.  - Continue Rivaroxaban + Clopidogrel '75mg'$  daily for 12 months. If there are significant bleeding complications, can stop clopidogrel after 6 months. - No s/s angina. - Intolerant of statins. On Repatha.   3. PAD with left  subclavian artery stenosis and severe iliac disease - Followed by Dr. Fletcher Anon. - Now following with Dr. Sharol Given for toe ulcers - She is s/p stenting of bilateral common iliac artery stent placement extending into the distal aorta.  - Unable to take gabapentin. - On Xarelto. No bleeding issues.   4. COPD GOLD III - PFTs 9/20 with severe COPD. - Has now quit smoking for 2 months.   5. DM2 - Followed by PCP. - On Jardiance.  Follow up in 3 weeks with APP to reassess volume and in 3 months with Dr. Haroldine Laws.  Rafael Bihari, FNP  11:51 AM

## 2021-06-06 NOTE — Progress Notes (Signed)
Hannah French, female    DOB: 04-08-1941,    MRN: [id number]   Brief patient profile:  61  yowf quit smoking 7/022  On various bronchodilators but since Nov 2019 with GOLD II copd criteria established 06/11/19  referred to pulmonary clinic 06/19/2019 by Dr  Lind Covert.     History of Present Illness  06/19/2019  Pulmonary/ 1st office eval/[facility]  Chief Complaint  Patient presents with   Pulmonary Consult    Referred by Dr. Jeffie Pollock. Pt c/o DOE since Nov 2019, worse over the past few months. She gets winded doing housework.  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 The medications like advair are useful just to treat the symptoms and make you fell better but do not change to course of the disease - clean air does for your lungs what a cholesterol pill does for your heart.  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 - Flutter valve  - 6MWT         06/06/2021  f/u ov/Kristy Catoe re: GOLD II    maint on trelegy 100 one daily  plus prn levoalb and on hospice now mostly bothered by coughing fits  Chief Complaint  Patient presents with   Follow-up    Breathing has been worse over the past 2 days- relates to rain and humidity. She states has a hard time coughing up mucus and when she does it's yellow and brown. She is using her albuterol inhaler about 6 x per wk.    Dyspnea:  around the house, limited by knees  Cough: congested / day > noct  Sleeping: occ disturbed by cough  SABA use: bed is flat with 3 pillows  02: 2 - 4lpm not checking sats  Covid status:   x 3 vax    No obvious day to day or daytime variability or assoc  mucus plugs or hemoptysis or cp or chest tightness, subjective wheeze or overt sinus or hb symptoms.   Sleeping  without nocturnal  or early am exacerbation  of respiratory  c/o's or need for noct saba. Also denies any obvious fluctuation of symptoms with weather or environmental changes or other aggravating or alleviating factors except as outlined above  No unusual exposure hx or h/o childhood pna/ asthma or knowledge of premature birth.  Current Allergies, Complete Past Medical History, Past Surgical History, Family History, and Social History were reviewed in [employer] record.  ROS  The following are not active complaints unless bolded Hoarseness, sore throat, dysphagia, dental problems, itching, sneezing,  nasal congestion or discharge of excess mucus or purulent secretions, ear ache,   fever, chills, sweats, unintended wt loss or wt gain, classically pleuritic or exertional cp,  orthopnea pnd or arm/hand swelling  or leg swelling, presyncope, palpitations, abdominal pain, anorexia, nausea, vomiting, diarrhea  or change in bowel habits or change in bladder habits, change in stools or change in urine, dysuria, hematuria,  rash,  arthralgias, visual complaints, headache, numbness, weakness or ataxia or problems with walking or coordination,  change in mood or  memory.        Current Meds  Medication Sig   acetaminophen (TYLENOL) 500 MG tablet Take 500 mg by mouth every 6 (six) hours as needed for moderate pain.   albuterol (VENTOLIN HFA) 108 (90 Base) MCG/ACT inhaler Inhale 2 puffs into the lungs every 6 (six) hours as needed for wheezing or shortness of breath.   Cholecalciferol (VITAMIN D) 50 MCG (2000 UT) tablet Take 4,000 Units by mouth daily.    clopidogrel (PLAVIX) 75 MG tablet Take 1 tablet (75 mg total) by mouth daily.   dextromethorphan-guaiFENesin (MUCINEX DM) 30-600 MG 12hr tablet Take 2 tablets by mouth 2 (two) times daily as needed for cough.   empagliflozin (JARDIANCE) 10 MG TABS tablet Take 10 mg by mouth daily.   Fluticasone-Umeclidin-Vilant (TRELEGY ELLIPTA) 100-62.5-25 MCG/INH AEPB Inhale 1 puff into the lungs daily.   Glucose Blood (BLOOD GLUCOSE TEST STRIPS) STRP 1 strip by In Vitro route 2 (two) times daily.   insulin aspart (NOVOLOG) 100 UNIT/ML injection Inject 0-9 Units into the skin 3 (three) times daily with meals. Sliding Scale CBG 70 - 120: 0 units  CBG 121 - 150: 1 unit  CBG 151 - 200: 2 units  CBG 201 - 250: 3 units  CBG 251 - 300: 5 units  CBG 301 - 350: 7 units  CBG 351 - 400: 9 units  CBG > 400: give 9 units and call your MD for additional instructions   insulin degludec (TRESIBA FLEXTOUCH) 100 UNIT/ML FlexTouch Pen Inject 7 Units into the skin daily.   levalbuterol (XOPENEX) 0.63 MG/3ML nebulizer solution Inhale into the lungs.   LORazepam (ATIVAN) 0.5 MG tablet Take 0.5 mg by mouth every 3 (three) hours as needed.   Morphine Sulfate (MORPHINE CONCENTRATE) 10 mg / 0.5 ml concentrated solution 10 mg.   Naphazoline-Pheniramine (OPCON-A) 0.027-0.315 % SOLN Place 1 drop into both eyes daily as needed (Burning and itching eyes).   nitroGLYCERIN (NITRODUR - DOSED IN MG/24 HR) 0.2 mg/hr  patch Place 1 patch (0.2 mg total) onto the skin daily.   nitroGLYCERIN (NITROSTAT) 0.3 MG SL tablet Place 1 tablet (0.3 mg total) under the tongue every 5 (five) minutes as needed for chest pain.   ondansetron (ZOFRAN) 4 MG tablet Take 1 tablet (4 mg total) by mouth every 4 (four) hours as needed for nausea or vomiting.   OXYGEN Inhale 5 Doses into the lungs continuous. 5 liters n/c   pentoxifylline (TRENTAL) 400 MG CR tablet TAKE  (1)  TABLET  THREE TIMES DAILY WITH MEALS.   potassium chloride SA (KLOR-CON) 20 MEQ tablet Take 2 tablets (40 mEq total) by mouth  as directed. Only when you take Metolazone   sennosides-docusate sodium (SENOKOT-S) 8.6-50 MG tablet Take 1 tablet by mouth daily.   Torsemide 40 MG TABS Take 60 mg by mouth daily.         Past Medical History:  Diagnosis Date   Abdominal bruit    Bladder cancer ([place])    CAD (coronary artery disease)    a. nonobstructive by cath 08/2018.   Carotid bruit    Chronic combined systolic and diastolic CHF (congestive heart failure) ([facility])    CKD (chronic kidney disease), stage II    Diabetes mellitus    Type II   Dyslipidemia    Heart murmur    Mild pulmonary hypertension ([facility])    Mitral valve prolapse    a. not seen on most recent echoes. Mild MR now.   NICM (nonischemic cardiomyopathy) ([place])    Osteoporosis    Pancreatitis, acute    Pseudogout    PVD (peripheral vascular disease) ([place])    a. 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   Sinus tachycardia    Tobacco abuse          Objective:       Wt Readings from Last 3 Encounters:  06/06/21 116 lb 12.8 oz (53 kg)  06/06/21 116 lb 3.2 oz (52.7 kg)  05/16/21 114 lb 3.2 oz (51.8 kg)      Vital signs reviewed  06/06/2021  - Note at rest 02 sats  97% on 3lpm pulsed    General appearance:    w/c bound difficulty clearing throat     HEENT : pt wearing mask not removed for exam due to covid -19 concerns.    NECK :   without JVD/Nodes/TM/ nl carotid upstrokes bilaterally   LUNGS: no acc muscle use,  Mod barrel  contour chest wall with bilateral  Distant bs s audible wheeze and  without cough on insp or exp maneuvers and mod  Hyperresonant  to  percussion bilaterally     CV:  RRR  no s3 or murmur or increase in P2, and no edema   ABD:  soft and nontender with pos mid insp Hoover's  in the supine position. No bruits or organomegaly appreciated, bowel sounds nl  MS:     ext warm without deformities, calf tenderness, cyanosis or clubbing No obvious joint restrictions   SKIN: warm and dry without lesions    NEURO:  alert, approp, nl sensorium with  no motor or cerebellar deficits apparent.         I personally reviewed images and agree with radiology impression as follows:  CXR:   pa and lateral 05/02/21  No acute cardiopulmonary abnormalities.   Signs of COPD/emphysema.    Assessment   Outpatient Encounter Medications as of 06/06/2021  Medication Sig   acetaminophen (TYLENOL) 500 MG tablet Take 500 mg by mouth every 6 (six) hours as needed for moderate pain.   albuterol (VENTOLIN HFA) 108 (90 Base) MCG/ACT inhaler Inhale 2 puffs into the lungs every 6 (six) hours as needed for wheezing or shortness of breath.   Cholecalciferol (VITAMIN D) 50 MCG (2000 UT) tablet Take 4,000 Units by mouth daily.    clopidogrel (PLAVIX) 75 MG tablet Take 1 tablet (75 mg total) by mouth daily.   dextromethorphan-guaiFENesin (MUCINEX DM) 30-600 MG 12hr tablet Take 2 tablets by mouth 2 (two) times daily as needed for cough.   empagliflozin (JARDIANCE) 10 MG TABS tablet Take 10 mg by  mouth daily.   Fluticasone-Umeclidin-Vilant (TRELEGY ELLIPTA) 100-62.5-25 MCG/INH AEPB Inhale 1 puff into the lungs daily.   Glucose Blood (BLOOD GLUCOSE TEST STRIPS) STRP 1 strip by In Vitro route 2 (two) times daily.   insulin aspart (NOVOLOG) 100 UNIT/ML injection Inject 0-9 Units into the skin 3 (three) times daily with meals. Sliding  Scale CBG 70 - 120: 0 units  CBG 121 - 150: 1 unit  CBG 151 - 200: 2 units  CBG 201 - 250: 3 units  CBG 251 - 300: 5 units  CBG 301 - 350: 7 units  CBG 351 - 400: 9 units  CBG > 400: give 9 units and call your MD for additional instructions   insulin degludec (TRESIBA FLEXTOUCH) 100 UNIT/ML FlexTouch Pen Inject 7 Units into the skin daily.   levalbuterol (XOPENEX) 0.63 MG/3ML nebulizer solution Inhale into the lungs.   LORazepam (ATIVAN) 0.5 MG tablet Take 0.5 mg by mouth every 3 (three) hours as needed.   Morphine Sulfate (MORPHINE CONCENTRATE) 10 mg / 0.5 ml concentrated solution 10 mg.   Naphazoline-Pheniramine (OPCON-A) 0.027-0.315 % SOLN Place 1 drop into both eyes daily as needed (Burning and itching eyes).   nitroGLYCERIN (NITRODUR - DOSED IN MG/24 HR) 0.2 mg/hr patch Place 1 patch (0.2 mg total) onto the skin daily.   nitroGLYCERIN (NITROSTAT) 0.3 MG SL tablet Place 1 tablet (0.3 mg total) under the tongue every 5 (five) minutes as needed for chest pain.   ondansetron (ZOFRAN) 4 MG tablet Take 1 tablet (4 mg total) by mouth every 4 (four) hours as needed for nausea or vomiting.   OXYGEN Inhale 5 Doses into the lungs continuous. 5 liters n/c   pentoxifylline (TRENTAL) 400 MG CR tablet TAKE  (1)  TABLET  THREE TIMES DAILY WITH MEALS.   potassium chloride SA (KLOR-CON) 20 MEQ tablet Take 2 tablets (40 mEq total) by mouth as directed. Only when you take Metolazone   predniSONE (DELTASONE) 10 MG tablet Take  4 each am x 2 days,   2 each am x 2 days,  1 each am x 2 days and stop   revefenacin (YUPELRI) 175 MCG/3ML nebulizer solution Take 3 mLs (175 mcg total) by nebulization daily.   sennosides-docusate sodium (SENOKOT-S) 8.6-50 MG tablet Take 1 tablet by mouth daily.   Torsemide 40 MG TABS Take 60 mg by mouth daily.

## 2021-06-06 NOTE — Assessment & Plan Note (Signed)
05/16/21      HC03  = 34 06/06/2021  HC02  = 31   Advised: Make sure you check your oxygen saturation  at your highest level of activity  to be sure it stays over 90% and adjust  02 flow upward to maintain this level if needed but remember to turn it back to previous settings when you stop (to conserve your supply).          Each maintenance medication was reviewed in detail including emphasizing most importantly the difference between maintenance and prns and under what circumstances the prns are to be triggered using an action plan format where appropriate.  Total time for H and P, chart review, counseling, reviewing dpi/ neb/02 device(s) and generating customized AVS unique to this post hops f/u office visit / same day charting  > 30 min

## 2021-06-06 NOTE — Patient Instructions (Signed)
Labs were performed today, if labs are abnormal the clinic will call you  INCREASE Torsemide to 60 mg daily  Your physician recommends that you return for lab work in: 10 days  for [facility]  Your physician recommends that you schedule a follow-up appointment in:   3 weeks and then in 3 months those appointment have been scheduled for you  At the [facility], you and your health needs are our priority. As part of our continuing mission to provide you with exceptional heart care, we have created designated Provider Care Teams. These Care Teams include your primary Cardiologist (physician) and Advanced Practice Providers (APPs- Physician Assistants and Nurse Practitioners) who all work together to provide you with the care you need, when you need it.   You may see any of the following providers on your designated Care Team at your next follow up: Dr Glori Bickers Dr Loralie Champagne Dr Patrice Paradise, NP Lyda Jester, [place] Ginnie Smart Audry Riles, PharmD   Please be sure to bring in all your medications bottles to every appointment.    If you have any questions or concerns before your next appointment please send [place] a message through [place] or call our office at [phone].    TO LEAVE A MESSAGE FOR THE NURSE SELECT OPTION 2, PLEASE LEAVE A MESSAGE INCLUDING: YOUR NAME DATE OF BIRTH CALL BACK NUMBER REASON FOR CALL**this is important as we prioritize the call backs  YOU WILL RECEIVE A CALL BACK THE SAME DAY AS LONG AS YOU CALL BEFORE 4:00 PM

## 2021-06-06 NOTE — Assessment & Plan Note (Addendum)
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   These are the same pts (now being labeled as having "irritable larynx syndrome" by some cough centers) who not infrequently have a history of having failed to tolerate ace inhibitors,  dry powder inhalers or biphosphonates or report having atypical/extraesophageal reflux symptoms that don't respond to standard doses of PPI  and are easily confused as having aecopd or asthma flares by even experienced allergists/ pulmonologists (myself included).   rec max mucined dm/ flutter/  max gerd rx/ change dpi to neb yupelri qd and alb qid and also >>> also so added 6 day taper off  Prednisone starting at 40 mg per day in case of component of Th-2 driven upper or lower airways inflammation (if cough responds short term only to relapse before return while will on full rx for uacs (as above), then  that would point to allergic rhinitis/ asthma or eos bronchitis as alternative dx)   >>>  regroup in 4 weeks / agrees hospice approp at this point but will see what we can do to improve symptom control.

## 2021-06-06 NOTE — Progress Notes (Signed)
ReDS Vest / Clip - 06/06/21 1200       ReDS Vest / Clip   Station Marker A    Ruler Value 23    ReDS Value Range Moderate volume overload    ReDS Actual Value 39

## 2021-06-06 NOTE — Patient Instructions (Addendum)
For cough cough/ congestion >  max dose of mucinex dm is 1200 mg every 12 hours and use flutter valve as much as possible.  Prednisone 10 mg take  4 each am x 2 days,   2 each am x 2 days,  1 each am x 2 days and stop   Try prilosec otc '20mg'$   Take 30-60 min before first meal of the day and Pepcid ac (famotidine) 20 mg one @  bedtime until cough is completely gone for at least a week without the need for cough suppression   Stop trelegy and start taking levoalbuterol 4 x daily with once daily yupelri   If not better, change back to trelegy one click each am   Please schedule a follow up office visit in 4 weeks, sooner if needed in [place] or here - whichever works best

## 2021-06-09 ENCOUNTER — Other Ambulatory Visit (HOSPITAL_COMMUNITY)

## 2021-06-09 ENCOUNTER — Emergency Department (HOSPITAL_COMMUNITY): Payer: Medicare Other

## 2021-06-09 ENCOUNTER — Encounter (HOSPITAL_COMMUNITY): Payer: Self-pay | Admitting: Emergency Medicine

## 2021-06-09 ENCOUNTER — Inpatient Hospital Stay (HOSPITAL_COMMUNITY)
Admission: EM | Admit: 2021-06-09 | Discharge: 2021-06-13 | DRG: 291 | Disposition: A | Discharge status: Hospice | Payer: Medicare Other | Attending: Family Medicine | Admitting: Family Medicine

## 2021-06-09 DIAGNOSIS — R000 Tachycardia, unspecified: Secondary | ICD-10-CM | POA: Diagnosis not present

## 2021-06-09 DIAGNOSIS — D75839 Thrombocytosis, unspecified: Secondary | ICD-10-CM | POA: Diagnosis present

## 2021-06-09 DIAGNOSIS — R64 Cachexia: Secondary | ICD-10-CM | POA: Diagnosis present

## 2021-06-09 DIAGNOSIS — Z79899 Other long term (current) drug therapy: Secondary | ICD-10-CM

## 2021-06-09 DIAGNOSIS — E1151 Type 2 diabetes mellitus with diabetic peripheral angiopathy without gangrene: Secondary | ICD-10-CM | POA: Diagnosis present

## 2021-06-09 DIAGNOSIS — I499 Cardiac arrhythmia, unspecified: Secondary | ICD-10-CM | POA: Diagnosis not present

## 2021-06-09 DIAGNOSIS — J80 Acute respiratory distress syndrome: Secondary | ICD-10-CM | POA: Diagnosis not present

## 2021-06-09 DIAGNOSIS — Z515 Encounter for palliative care: Secondary | ICD-10-CM

## 2021-06-09 DIAGNOSIS — D72828 Other elevated white blood cell count: Secondary | ICD-10-CM | POA: Diagnosis present

## 2021-06-09 DIAGNOSIS — E785 Hyperlipidemia, unspecified: Secondary | ICD-10-CM | POA: Diagnosis present

## 2021-06-09 DIAGNOSIS — J441 Chronic obstructive pulmonary disease with (acute) exacerbation: Secondary | ICD-10-CM

## 2021-06-09 DIAGNOSIS — Z720 Tobacco use: Secondary | ICD-10-CM | POA: Diagnosis present

## 2021-06-09 DIAGNOSIS — D631 Anemia in chronic kidney disease: Secondary | ICD-10-CM | POA: Diagnosis present

## 2021-06-09 DIAGNOSIS — Z794 Long term (current) use of insulin: Secondary | ICD-10-CM

## 2021-06-09 DIAGNOSIS — Z7984 Long term (current) use of oral hypoglycemic drugs: Secondary | ICD-10-CM

## 2021-06-09 DIAGNOSIS — R52 Pain, unspecified: Secondary | ICD-10-CM | POA: Diagnosis not present

## 2021-06-09 DIAGNOSIS — Z743 Need for continuous supervision: Secondary | ICD-10-CM | POA: Diagnosis not present

## 2021-06-09 DIAGNOSIS — I272 Pulmonary hypertension, unspecified: Secondary | ICD-10-CM | POA: Diagnosis present

## 2021-06-09 DIAGNOSIS — J9601 Acute respiratory failure with hypoxia: Secondary | ICD-10-CM | POA: Diagnosis present

## 2021-06-09 DIAGNOSIS — N1832 Chronic kidney disease, stage 3b: Secondary | ICD-10-CM | POA: Diagnosis present

## 2021-06-09 DIAGNOSIS — E782 Mixed hyperlipidemia: Secondary | ICD-10-CM | POA: Diagnosis not present

## 2021-06-09 DIAGNOSIS — Z8551 Personal history of malignant neoplasm of bladder: Secondary | ICD-10-CM

## 2021-06-09 DIAGNOSIS — I5042 Chronic combined systolic (congestive) and diastolic (congestive) heart failure: Secondary | ICD-10-CM | POA: Diagnosis not present

## 2021-06-09 DIAGNOSIS — J9612 Chronic respiratory failure with hypercapnia: Secondary | ICD-10-CM

## 2021-06-09 DIAGNOSIS — E119 Type 2 diabetes mellitus without complications: Secondary | ICD-10-CM

## 2021-06-09 DIAGNOSIS — J449 Chronic obstructive pulmonary disease, unspecified: Secondary | ICD-10-CM | POA: Diagnosis not present

## 2021-06-09 DIAGNOSIS — Z7189 Other specified counseling: Secondary | ICD-10-CM | POA: Diagnosis not present

## 2021-06-09 DIAGNOSIS — J9622 Acute and chronic respiratory failure with hypercapnia: Secondary | ICD-10-CM

## 2021-06-09 DIAGNOSIS — Z87891 Personal history of nicotine dependence: Secondary | ICD-10-CM

## 2021-06-09 DIAGNOSIS — I428 Other cardiomyopathies: Secondary | ICD-10-CM | POA: Diagnosis present

## 2021-06-09 DIAGNOSIS — M810 Age-related osteoporosis without current pathological fracture: Secondary | ICD-10-CM | POA: Diagnosis present

## 2021-06-09 DIAGNOSIS — N183 Chronic kidney disease, stage 3 unspecified: Secondary | ICD-10-CM | POA: Diagnosis present

## 2021-06-09 DIAGNOSIS — I2510 Atherosclerotic heart disease of native coronary artery without angina pectoris: Secondary | ICD-10-CM | POA: Diagnosis present

## 2021-06-09 DIAGNOSIS — E1165 Type 2 diabetes mellitus with hyperglycemia: Secondary | ICD-10-CM | POA: Diagnosis present

## 2021-06-09 DIAGNOSIS — I6523 Occlusion and stenosis of bilateral carotid arteries: Secondary | ICD-10-CM | POA: Diagnosis not present

## 2021-06-09 DIAGNOSIS — Z7951 Long term (current) use of inhaled steroids: Secondary | ICD-10-CM

## 2021-06-09 DIAGNOSIS — R531 Weakness: Secondary | ICD-10-CM | POA: Diagnosis not present

## 2021-06-09 DIAGNOSIS — I5023 Acute on chronic systolic (congestive) heart failure: Secondary | ICD-10-CM | POA: Diagnosis present

## 2021-06-09 DIAGNOSIS — F17210 Nicotine dependence, cigarettes, uncomplicated: Secondary | ICD-10-CM | POA: Diagnosis present

## 2021-06-09 DIAGNOSIS — I059 Rheumatic mitral valve disease, unspecified: Secondary | ICD-10-CM | POA: Diagnosis present

## 2021-06-09 DIAGNOSIS — Z9981 Dependence on supplemental oxygen: Secondary | ICD-10-CM

## 2021-06-09 DIAGNOSIS — E1122 Type 2 diabetes mellitus with diabetic chronic kidney disease: Secondary | ICD-10-CM | POA: Diagnosis present

## 2021-06-09 DIAGNOSIS — E8770 Fluid overload, unspecified: Secondary | ICD-10-CM

## 2021-06-09 DIAGNOSIS — N179 Acute kidney failure, unspecified: Secondary | ICD-10-CM

## 2021-06-09 DIAGNOSIS — E1169 Type 2 diabetes mellitus with other specified complication: Secondary | ICD-10-CM | POA: Diagnosis present

## 2021-06-09 DIAGNOSIS — J9600 Acute respiratory failure, unspecified whether with hypoxia or hypercapnia: Secondary | ICD-10-CM | POA: Diagnosis not present

## 2021-06-09 DIAGNOSIS — Z6823 Body mass index (BMI) 23.0-23.9, adult: Secondary | ICD-10-CM

## 2021-06-09 DIAGNOSIS — Z20822 Contact with and (suspected) exposure to covid-19: Secondary | ICD-10-CM | POA: Diagnosis present

## 2021-06-09 DIAGNOSIS — J9611 Chronic respiratory failure with hypoxia: Secondary | ICD-10-CM | POA: Diagnosis not present

## 2021-06-09 DIAGNOSIS — R0602 Shortness of breath: Secondary | ICD-10-CM | POA: Diagnosis present

## 2021-06-09 DIAGNOSIS — D75838 Other thrombocytosis: Secondary | ICD-10-CM | POA: Diagnosis present

## 2021-06-09 DIAGNOSIS — J9690 Respiratory failure, unspecified, unspecified whether with hypoxia or hypercapnia: Secondary | ICD-10-CM | POA: Diagnosis not present

## 2021-06-09 DIAGNOSIS — Z8249 Family history of ischemic heart disease and other diseases of the circulatory system: Secondary | ICD-10-CM | POA: Diagnosis not present

## 2021-06-09 DIAGNOSIS — T380X5A Adverse effect of glucocorticoids and synthetic analogues, initial encounter: Secondary | ICD-10-CM | POA: Diagnosis present

## 2021-06-09 DIAGNOSIS — D649 Anemia, unspecified: Secondary | ICD-10-CM | POA: Diagnosis present

## 2021-06-09 DIAGNOSIS — E559 Vitamin D deficiency, unspecified: Secondary | ICD-10-CM | POA: Diagnosis present

## 2021-06-09 DIAGNOSIS — Z66 Do not resuscitate: Secondary | ICD-10-CM | POA: Diagnosis present

## 2021-06-09 DIAGNOSIS — J811 Chronic pulmonary edema: Secondary | ICD-10-CM

## 2021-06-09 DIAGNOSIS — I6529 Occlusion and stenosis of unspecified carotid artery: Secondary | ICD-10-CM | POA: Diagnosis present

## 2021-06-09 DIAGNOSIS — J9621 Acute and chronic respiratory failure with hypoxia: Secondary | ICD-10-CM | POA: Diagnosis not present

## 2021-06-09 DIAGNOSIS — Z825 Family history of asthma and other chronic lower respiratory diseases: Secondary | ICD-10-CM

## 2021-06-09 DIAGNOSIS — I739 Peripheral vascular disease, unspecified: Secondary | ICD-10-CM | POA: Diagnosis present

## 2021-06-09 DIAGNOSIS — I130 Hypertensive heart and chronic kidney disease with heart failure and stage 1 through stage 4 chronic kidney disease, or unspecified chronic kidney disease: Principal | ICD-10-CM | POA: Diagnosis present

## 2021-06-09 DIAGNOSIS — I25119 Atherosclerotic heart disease of native coronary artery with unspecified angina pectoris: Secondary | ICD-10-CM | POA: Diagnosis present

## 2021-06-09 DIAGNOSIS — Z7902 Long term (current) use of antithrombotics/antiplatelets: Secondary | ICD-10-CM

## 2021-06-09 DIAGNOSIS — D72829 Elevated white blood cell count, unspecified: Secondary | ICD-10-CM | POA: Diagnosis present

## 2021-06-09 LAB — COMPREHENSIVE METABOLIC PANEL
ALT: 20 U/L (ref 0–44)
AST: 30 U/L (ref 15–41)
Albumin: 4.1 g/dL (ref 3.5–5.0)
Alkaline Phosphatase: 58 U/L (ref 38–126)
Anion gap: 10 (ref 5–15)
BUN: 46 mg/dL — ABNORMAL HIGH (ref 8–23)
CO2: 29 mmol/L (ref 22–32)
Calcium: 8.9 mg/dL (ref 8.9–10.3)
Chloride: 97 mmol/L — ABNORMAL LOW (ref 98–111)
Creatinine, Ser: 1.81 mg/dL — ABNORMAL HIGH (ref 0.44–1.00)
GFR, Estimated: 28 mL/min — ABNORMAL LOW (ref >60)
Glucose, Bld: 261 mg/dL — ABNORMAL HIGH (ref 70–99)
Potassium: 3.7 mmol/L (ref 3.5–5.1)
Sodium: 136 mmol/L (ref 135–145)
Total Bilirubin: 1.1 mg/dL (ref 0.3–1.2)
Total Protein: 7.3 g/dL (ref 6.5–8.1)

## 2021-06-09 LAB — RESP PANEL BY RT-PCR (FLU A&B, COVID) ARPGX2
Influenza A by PCR: NEGATIVE
Influenza B by PCR: NEGATIVE
SARS Coronavirus 2 by RT PCR: NEGATIVE

## 2021-06-09 LAB — CBC WITH DIFFERENTIAL/PLATELET
Abs Immature Granulocytes: 0.16 10*3/uL — ABNORMAL HIGH (ref 0.00–0.07)
Basophils Absolute: 0.1 10*3/uL (ref 0.0–0.1)
Basophils Relative: 1 %
Eosinophils Absolute: 0.1 10*3/uL (ref 0.0–0.5)
Eosinophils Relative: 1 %
HCT: 32.3 % — ABNORMAL LOW (ref 36.0–46.0)
Hemoglobin: 10.1 g/dL — ABNORMAL LOW (ref 12.0–15.0)
Immature Granulocytes: 1 %
Lymphocytes Relative: 12 %
Lymphs Abs: 1.9 10*3/uL (ref 0.7–4.0)
MCH: 32.4 pg (ref 26.0–34.0)
MCHC: 31.3 g/dL (ref 30.0–36.0)
MCV: 103.5 fL — ABNORMAL HIGH (ref 80.0–100.0)
Monocytes Absolute: 0.7 10*3/uL (ref 0.1–1.0)
Monocytes Relative: 5 %
Neutro Abs: 12.9 10*3/uL — ABNORMAL HIGH (ref 1.7–7.7)
Neutrophils Relative %: 80 %
Platelets: 549 10*3/uL — ABNORMAL HIGH (ref 150–400)
RBC: 3.12 MIL/uL — ABNORMAL LOW (ref 3.87–5.11)
RDW: 22.8 % — ABNORMAL HIGH (ref 11.5–15.5)
WBC: 16 10*3/uL — ABNORMAL HIGH (ref 4.0–10.5)
nRBC: 1.3 % — ABNORMAL HIGH (ref 0.0–0.2)

## 2021-06-09 LAB — BLOOD GAS, ARTERIAL
Acid-Base Excess: 4 mmol/L — ABNORMAL HIGH (ref 0.0–2.0)
Bicarbonate: 27.1 mmol/L (ref 20.0–28.0)
FIO2: 30
O2 Saturation: 92.1 %
Patient temperature: 37
pCO2 arterial: 60.3 mmHg — ABNORMAL HIGH (ref 32.0–48.0)
pH, Arterial: 7.314 — ABNORMAL LOW (ref 7.350–7.450)
pO2, Arterial: 75.5 mmHg — ABNORMAL LOW (ref 83.0–108.0)

## 2021-06-09 LAB — HEMOGLOBIN A1C
Hgb A1c MFr Bld: 8.8 % — ABNORMAL HIGH (ref 4.8–5.6)
Mean Plasma Glucose: 205.86 mg/dL

## 2021-06-09 LAB — CBG MONITORING, ED
Glucose-Capillary: 351 mg/dL — ABNORMAL HIGH (ref 70–99)
Glucose-Capillary: 306 mg/dL — ABNORMAL HIGH (ref 70–99)
Glucose-Capillary: 227 mg/dL — ABNORMAL HIGH (ref 70–99)

## 2021-06-09 LAB — LACTIC ACID, PLASMA
Lactic Acid, Venous: 2 mmol/L (ref 0.5–1.9)
Lactic Acid, Venous: 1.9 mmol/L (ref 0.5–1.9)

## 2021-06-09 LAB — TROPONIN I (HIGH SENSITIVITY): Troponin I (High Sensitivity): 14 ng/L (ref <18)

## 2021-06-09 LAB — BRAIN NATRIURETIC PEPTIDE: B Natriuretic Peptide: 653 pg/mL — ABNORMAL HIGH (ref 0.0–100.0)

## 2021-06-09 MED ORDER — ACETAMINOPHEN 650 MG RE SUPP: 650.0000 mg | Freq: Four times a day (QID) | RECTAL | Status: DC | PRN

## 2021-06-09 MED ORDER — MAGNESIUM SULFATE 2 GM/50ML IV SOLN
2.0000 g | Freq: Once | INTRAVENOUS | Status: AC
  Administered 2021-06-09: 2 g via INTRAVENOUS
  Filled 2021-06-09: qty 50

## 2021-06-09 MED ORDER — SODIUM CHLORIDE 0.9 % IV SOLN
100.0000 mg | Freq: Two times a day (BID) | INTRAVENOUS | Status: DC
  Administered 2021-06-09 – 2021-06-10 (×3): 100 mg via INTRAVENOUS
  Filled 2021-06-09 (×9): qty 100

## 2021-06-09 MED ORDER — INSULIN GLARGINE-YFGN 100 UNIT/ML ~~LOC~~ SOLN
12.0000 [IU] | Freq: Every day | SUBCUTANEOUS | Status: DC
  Administered 2021-06-09: 12 [IU] via SUBCUTANEOUS
  Filled 2021-06-09 (×2): qty 0.12

## 2021-06-09 MED ORDER — HEPARIN SODIUM (PORCINE) 5000 UNIT/ML IJ SOLN
5000.0000 [IU] | Freq: Three times a day (TID) | INTRAMUSCULAR | Status: DC
  Administered 2021-06-09 – 2021-06-13 (×12): 5000 [IU] via SUBCUTANEOUS
  Filled 2021-06-09 (×12): qty 1

## 2021-06-09 MED ORDER — INSULIN ASPART 100 UNIT/ML IJ SOLN
6.0000 [IU] | Freq: Three times a day (TID) | INTRAMUSCULAR | Status: DC
  Administered 2021-06-09: 6 [IU] via SUBCUTANEOUS
  Filled 2021-06-09: qty 1

## 2021-06-09 MED ORDER — INSULIN ASPART 100 UNIT/ML IJ SOLN
0.0000 [IU] | Freq: Three times a day (TID) | INTRAMUSCULAR | Status: DC
  Administered 2021-06-09: 11 [IU] via SUBCUTANEOUS
  Administered 2021-06-10 – 2021-06-11 (×3): 5 [IU] via SUBCUTANEOUS
  Administered 2021-06-11 (×2): 8 [IU] via SUBCUTANEOUS
  Administered 2021-06-12: 15 [IU] via SUBCUTANEOUS
  Administered 2021-06-12 – 2021-06-13 (×2): 5 [IU] via SUBCUTANEOUS
  Filled 2021-06-09: qty 1

## 2021-06-09 MED ORDER — SODIUM CHLORIDE 0.9 % IV BOLUS: 500.0000 mL | Freq: Once | INTRAVENOUS | Status: DC

## 2021-06-09 MED ORDER — ROPINIROLE HCL 0.25 MG PO TABS
0.2500 mg | ORAL_TABLET | Freq: Every evening | ORAL | Status: DC | PRN
  Administered 2021-06-11 – 2021-06-12 (×2): 0.25 mg via ORAL
  Filled 2021-06-09 (×2): qty 1

## 2021-06-09 MED ORDER — LORAZEPAM 2 MG/ML IJ SOLN
1.0000 mg | Freq: Once | INTRAMUSCULAR | Status: AC
  Administered 2021-06-09: 1 mg via INTRAVENOUS

## 2021-06-09 MED ORDER — FUROSEMIDE 10 MG/ML IJ SOLN
40.0000 mg | Freq: Once | INTRAMUSCULAR | Status: AC
  Administered 2021-06-09: 40 mg via INTRAVENOUS
  Filled 2021-06-09: qty 4

## 2021-06-09 MED ORDER — LORAZEPAM 0.5 MG PO TABS
0.5000 mg | ORAL_TABLET | ORAL | Status: DC | PRN
  Administered 2021-06-10 – 2021-06-13 (×7): 0.5 mg via ORAL
  Filled 2021-06-09 (×7): qty 1

## 2021-06-09 MED ORDER — ALBUTEROL SULFATE (2.5 MG/3ML) 0.083% IN NEBU
INHALATION_SOLUTION | RESPIRATORY_TRACT | Status: AC
  Administered 2021-06-09: 5 mg
  Filled 2021-06-09: qty 6

## 2021-06-09 MED ORDER — PENTOXIFYLLINE ER 400 MG PO TBCR
400.0000 mg | EXTENDED_RELEASE_TABLET | Freq: Two times a day (BID) | ORAL | Status: DC
  Administered 2021-06-09 – 2021-06-13 (×8): 400 mg via ORAL
  Filled 2021-06-09 (×14): qty 1

## 2021-06-09 MED ORDER — INSULIN ASPART 100 UNIT/ML IJ SOLN
0.0000 [IU] | Freq: Three times a day (TID) | INTRAMUSCULAR | Status: DC
Start: 2021-06-09 — End: 2021-06-09
  Administered 2021-06-09: 9 [IU] via SUBCUTANEOUS
  Filled 2021-06-09: qty 1

## 2021-06-09 MED ORDER — IPRATROPIUM BROMIDE 0.02 % IN SOLN
RESPIRATORY_TRACT | Status: AC
  Administered 2021-06-09: 0.5 mg
  Filled 2021-06-09: qty 2.5

## 2021-06-09 MED ORDER — DM-GUAIFENESIN ER 30-600 MG PO TB12: 2.0000 | ORAL_TABLET | Freq: Two times a day (BID) | ORAL | Status: DC | PRN

## 2021-06-09 MED ORDER — IPRATROPIUM-ALBUTEROL 0.5-2.5 (3) MG/3ML IN SOLN
3.0000 mL | RESPIRATORY_TRACT | Status: DC
  Administered 2021-06-09 (×4): 3 mL via RESPIRATORY_TRACT
  Filled 2021-06-09 (×2): qty 3

## 2021-06-09 MED ORDER — NITROGLYCERIN 0.2 MG/HR TD PT24
0.2000 mg | MEDICATED_PATCH | Freq: Every day | TRANSDERMAL | Status: DC
  Administered 2021-06-09 – 2021-06-13 (×5): 0.2 mg via TRANSDERMAL
  Filled 2021-06-09 (×7): qty 1

## 2021-06-09 MED ORDER — GUAIFENESIN ER 600 MG PO TB12
600.0000 mg | ORAL_TABLET | Freq: Two times a day (BID) | ORAL | Status: DC
  Administered 2021-06-09 – 2021-06-13 (×8): 600 mg via ORAL
  Filled 2021-06-09 (×9): qty 1

## 2021-06-09 MED ORDER — LORAZEPAM 2 MG/ML IJ SOLN
INTRAMUSCULAR | Status: AC
  Filled 2021-06-09: qty 1

## 2021-06-09 MED ORDER — PENTOXIFYLLINE ER 400 MG PO TBCR: 400.0000 mg | EXTENDED_RELEASE_TABLET | Freq: Three times a day (TID) | ORAL | Status: DC

## 2021-06-09 MED ORDER — FUROSEMIDE 10 MG/ML IJ SOLN
40.0000 mg | Freq: Two times a day (BID) | INTRAMUSCULAR | Status: DC
  Administered 2021-06-09 – 2021-06-10 (×2): 40 mg via INTRAVENOUS
  Filled 2021-06-09 (×2): qty 4

## 2021-06-09 MED ORDER — SENNOSIDES-DOCUSATE SODIUM 8.6-50 MG PO TABS
1.0000 | ORAL_TABLET | Freq: Every day | ORAL | Status: DC
  Administered 2021-06-09 – 2021-06-13 (×5): 1 via ORAL
  Filled 2021-06-09 (×5): qty 1

## 2021-06-09 MED ORDER — NAPHAZOLINE-PHENIRAMINE 0.025-0.3 % OP SOLN
1.0000 [drp] | Freq: Every day | OPHTHALMIC | Status: DC | PRN
  Filled 2021-06-09: qty 5

## 2021-06-09 MED ORDER — IPRATROPIUM-ALBUTEROL 0.5-2.5 (3) MG/3ML IN SOLN
3.0000 mL | Freq: Three times a day (TID) | RESPIRATORY_TRACT | Status: DC
  Administered 2021-06-10 – 2021-06-13 (×10): 3 mL via RESPIRATORY_TRACT
  Filled 2021-06-09 (×10): qty 3

## 2021-06-09 MED ORDER — ACETAMINOPHEN 325 MG PO TABS: 650.0000 mg | ORAL_TABLET | Freq: Four times a day (QID) | ORAL | Status: DC | PRN

## 2021-06-09 MED ORDER — HYDRALAZINE HCL 20 MG/ML IJ SOLN: 5.0000 mg | INTRAMUSCULAR | Status: DC | PRN

## 2021-06-09 MED ORDER — INSULIN ASPART 100 UNIT/ML IJ SOLN
0.0000 [IU] | Freq: Every day | INTRAMUSCULAR | Status: DC
Start: 2021-06-09 — End: 2021-06-13
  Administered 2021-06-09: 2 [IU] via SUBCUTANEOUS
  Administered 2021-06-12: 4 [IU] via SUBCUTANEOUS
  Filled 2021-06-09: qty 1

## 2021-06-09 MED ORDER — ALBUTEROL SULFATE HFA 108 (90 BASE) MCG/ACT IN AERS
6.0000 | INHALATION_SPRAY | Freq: Once | RESPIRATORY_TRACT | Status: DC
Start: 2021-06-09 — End: 2021-06-13

## 2021-06-09 MED ORDER — CLOPIDOGREL BISULFATE 75 MG PO TABS
75.0000 mg | ORAL_TABLET | Freq: Every day | ORAL | Status: DC
  Administered 2021-06-10 – 2021-06-13 (×4): 75 mg via ORAL
  Filled 2021-06-09 (×4): qty 1

## 2021-06-09 MED ORDER — METHYLPREDNISOLONE SODIUM SUCC 125 MG IJ SOLR
60.0000 mg | Freq: Two times a day (BID) | INTRAMUSCULAR | Status: DC
  Administered 2021-06-09 – 2021-06-11 (×4): 60 mg via INTRAVENOUS
  Filled 2021-06-09 (×4): qty 2

## 2021-06-09 MED ORDER — FENTANYL CITRATE PF 50 MCG/ML IJ SOSY: 25.0000 ug | PREFILLED_SYRINGE | INTRAMUSCULAR | Status: DC | PRN

## 2021-06-09 NOTE — ED Notes (Signed)
Date and time results received: 06/09/21 7:22 AM  Test: lactic acid Critical Value: 2.0  Name of Provider Notified: haviland  Orders Received? Or Actions Taken?: md notified

## 2021-06-09 NOTE — H&P (Signed)
History and Physical  [facility]  Hannah French [id number] DOB: July 03, 1941 DOA: 06/09/2021  PCP: Dettinger, Fransisca Kaufmann, MD  Patient coming from: Home by [facility]  Level of care: Stepdown  I have personally briefly reviewed patient's old medical records in Hannah French  Chief Complaint: SOB  HPI: Hannah French is a 80 y.o. 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  She is DNR.  She has been having difficulty with increasing dyspnea over the past weeks.  She has had increasing weight gain and reported to her daughter that she is afraid to sleep alone at night.  Daughter has been staying with her for the past several days and reports recently changed to torsemide due to difficulty diuresing with Lasix.  She continues to have shortness of breath that acutely became worse in the early morning hours prompting daughter to call EMS.  They gave her a dose of Solu-Medrol 125 mg and started her on 15 L nonrebreather due to respiratory distress.  ED Course: Patient arrived anxious and with severe shortness of breath and placed on BiPAP.  She was given a dose of Ativan IV.  She had an ABG with a pH of 7.314, PCO2 60.3, PO2 75.5 with an oxygen saturation of 92.1%.  Sodium 136 potassium 3.7 glucose 261 creatinine 1.81, cardiac BNP 653.0, lactic acid 2.0, WBC 16.0, hemoglobin 10.1, platelet count 549.  Influenza A and B test negative.  SARS 2 coronavirus test negative.  Chest x-ray with findings of diffuse interstitial edema and a small right pleural effusion.  EKG was sinus tachycardia.  Bronchodilators  ordered and admission requested.  Review of Systems: Review of Systems  Unable to perform ROS: Patient unresponsive  Respiratory:  Positive for shortness of breath.     Past Medical History:  Diagnosis Date   Abdominal bruit    Bladder cancer ([place])    CAD (coronary artery disease)    a. nonobstructive by cath 08/2018.   Carotid bruit    Chronic combined systolic and diastolic CHF (congestive heart failure) (HCC)    CKD (chronic kidney disease), stage II    Congestive heart failure (CHF) (HCC)    Diabetes mellitus    Type II   Dyslipidemia    Heart murmur    Mild pulmonary hypertension (HCC)    Mitral valve prolapse    a. not seen on most recent echoes. Mild MR now.   NICM (nonischemic cardiomyopathy) ([place])    Osteoporosis    Pancreatitis, acute    Pseudogout    PVD (peripheral vascular disease) ([place])    a. 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   Sinus tachycardia    Tobacco abuse     Past Surgical History:  Procedure Laterality Date   ABDOMINAL AORTOGRAM W/LOWER EXTREMITY N/A 12/31/2019   Procedure: ABDOMINAL AORTOGRAM W/LOWER EXTREMITY;  Surgeon: Wellington Hampshire, MD;  Location: [place] [facility];  Service: Cardiovascular;  Laterality: N/A;   ABDOMINAL AORTOGRAM W/LOWER EXTREMITY N/A 09/01/2020   Procedure: ABDOMINAL AORTOGRAM W/LOWER EXTREMITY;  Surgeon: Wellington Hampshire, MD;  Location: [place] [facility];  Service: Cardiovascular;  Laterality: N/A;   APPENDECTOMY  CORONARY STENT INTERVENTION N/A 10/13/2020   Procedure: CORONARY STENT INTERVENTION;  Surgeon: Leonie Man, MD;  Location: [place] [facility];  Service: Cardiovascular;  Laterality: N/A;   Hysterectomy-type unspecified     INTRAVASCULAR PRESSURE WIRE/FFR STUDY N/A 10/13/2020   Procedure: INTRAVASCULAR PRESSURE WIRE/FFR STUDY;  Surgeon: Leonie Man, MD;  Location: [place] [facility];  Service: Cardiovascular;  Laterality: N/A;   LAPAROSCOPIC  CHOLECYSTECTOMY  2009   PERIPHERAL VASCULAR INTERVENTION  12/31/2019   Procedure: PERIPHERAL VASCULAR INTERVENTION;  Surgeon: Wellington Hampshire, MD;  Location: [place] [facility];  Service: Cardiovascular;;  Bilateral [place]   RIGHT/LEFT HEART CATH AND CORONARY ANGIOGRAPHY N/A 09/16/2018   Procedure: RIGHT/LEFT HEART CATH AND CORONARY ANGIOGRAPHY;  Surgeon: Sherren Mocha, MD;  Location: [place] [facility];  Service: Cardiovascular;  Laterality: N/A;   RIGHT/LEFT HEART CATH AND CORONARY ANGIOGRAPHY N/A 10/13/2020   Procedure: RIGHT/LEFT HEART CATH AND CORONARY ANGIOGRAPHY;  Surgeon: Jolaine Artist, MD;  Location: [place] [facility];  Service: Cardiovascular;  Laterality: N/A;     reports that she quit smoking about 2 months ago. Her smoking use included cigarettes. She has a 50.00 pack-year smoking history. She has never used smokeless tobacco. She reports that she does not drink alcohol and does not use drugs.  Allergies  Allergen Reactions   Contrast Media [Iodinated Diagnostic Agents] Shortness Of Breath   Iohexol Other (See Comments)    PASSED OUT DURING THE TEST     Metformin Swelling    Face and hands became swollen   Statins Swelling and Other (See Comments)    They make the patient hurt "all over" Myalgia, also   Zetia [Ezetimibe] Swelling   Bextra [Valdecoxib] Nausea And Vomiting and Swelling    Shut down my kidneys    Cefdinir Other (See Comments)    Pancreatitis    Cozaar [Losartan Potassium] Other (See Comments)    dizziness   Gabapentin    Nsaids Other (See Comments)    GI intolerance and pain all over, tolerates ibu    Rofecoxib Other (See Comments)    "VIOXX"- "SHUT DOWN MY KIDNEYS"   Sulfa Antibiotics Nausea And Vomiting and Other (See Comments)    Pancreatitis    Family History  Problem Relation Age of Onset   Heart failure Mother    Hypertension Sister    Heart attack Brother    Heart attack Maternal Uncle    Heart disease Sister    COPD Brother     Congestive Heart Failure Brother    Congestive Heart Failure Daughter    Rheum arthritis Son    Ovarian cancer Sister    Coronary artery disease Neg Hx        Early    Prior to Admission medications   Medication Sig Start Date End Date Taking? Authorizing Provider  acetaminophen (TYLENOL) 500 MG tablet Take 500 mg by mouth every 6 (six) hours as needed for moderate pain.    [provider]  albuterol (VENTOLIN HFA) 108 (90 Base) MCG/ACT inhaler Inhale 2 puffs into the lungs every 6 (six) hours as needed for wheezing or shortness of breath. 02/02/21   Martyn Ehrich, NP  Cholecalciferol (VITAMIN D) 50 MCG (2000 UT) tablet Take 4,000 Units by mouth daily.     [provider]  clopidogrel (PLAVIX) 75 MG tablet Take 1 tablet (75 mg total) by mouth daily. 10/13/20   Cheryln Manly, NP  dextromethorphan-guaiFENesin [facility] DM) 30-600 MG 12hr tablet  Take 2 tablets by mouth 2 (two) times daily as needed for cough. 05/03/21   Evelina Dun A, FNP  empagliflozin (JARDIANCE) 10 MG TABS tablet Take 10 mg by mouth daily.    [provider]  Fluticasone-Umeclidin-Vilant (TRELEGY ELLIPTA) 100-62.5-25 MCG/INH AEPB Inhale 1 puff into the lungs daily. 02/02/21   Martyn Ehrich, NP  Glucose Blood (BLOOD GLUCOSE TEST STRIPS) STRP 1 strip by In Vitro route 2 (two) times daily. 04/01/21   Dettinger, Fransisca Kaufmann, MD  insulin aspart (NOVOLOG) 100 UNIT/ML injection Inject 0-9 Units into the skin 3 (three) times daily with meals. Sliding Scale CBG 70 - 120: 0 units  CBG 121 - 150: 1 unit  CBG 151 - 200: 2 units  CBG 201 - 250: 3 units  CBG 251 - 300: 5 units  CBG 301 - 350: 7 units  CBG 351 - 400: 9 units  CBG > 400: give 9 units and call your MD for additional instructions 04/12/21   Elodia Florence., MD  insulin degludec (TRESIBA FLEXTOUCH) 100 UNIT/ML FlexTouch Pen Inject 7 Units into the skin daily. 04/12/21   Elodia Florence., MD  levalbuterol Penne Lash) 0.63 MG/3ML  nebulizer solution Inhale into the lungs. 04/18/21   [provider]  LORazepam (ATIVAN) 0.5 MG tablet Take 0.5 mg by mouth every 3 (three) hours as needed. 04/26/21   [provider]  Morphine Sulfate (MORPHINE CONCENTRATE) 10 mg / 0.5 ml concentrated solution 10 mg. 04/16/21   [provider]  Naphazoline-Pheniramine (OPCON-A) 0.027-0.315 % SOLN Place 1 drop into both eyes daily as needed (Burning and itching eyes).    [provider]  nitroGLYCERIN (NITRODUR - DOSED IN MG/24 HR) 0.2 mg/hr patch Place 1 patch (0.2 mg total) onto the skin daily. 11/02/20   Newt Minion, MD  nitroGLYCERIN (NITROSTAT) 0.3 MG SL tablet Place 1 tablet (0.3 mg total) under the tongue every 5 (five) minutes as needed for chest pain. 02/02/21 02/02/22  Dettinger, Fransisca Kaufmann, MD  ondansetron (ZOFRAN) 4 MG tablet Take 1 tablet (4 mg total) by mouth every 4 (four) hours as needed for nausea or vomiting. 05/03/21   Evelina Dun A, FNP  OXYGEN Inhale 5 Doses into the lungs continuous. 5 liters n/c    [provider]  pentoxifylline (TRENTAL) 400 MG CR tablet TAKE  (1)  TABLET  THREE TIMES DAILY WITH MEALS. 03/10/21   Newt Minion, MD  potassium chloride SA (KLOR-CON) 20 MEQ tablet Take 2 tablets (40 mEq total) by mouth as directed. Only when you take Metolazone 04/04/21   Bensimhon, Shaune Pascal, MD  predniSONE (DELTASONE) 10 MG tablet Take  4 each am x 2 days,   2 each am x 2 days,  1 each am x 2 days and stop 06/06/21   Tanda Rockers, MD  revefenacin (YUPELRI) 175 MCG/3ML nebulizer solution Take 3 mLs (175 mcg total) by nebulization daily. 06/06/21   Tanda Rockers, MD  sennosides-docusate sodium (SENOKOT-S) 8.6-50 MG tablet Take 1 tablet by mouth daily. 04/04/21   Dettinger, Fransisca Kaufmann, MD  Torsemide 40 MG TABS Take 60 mg by mouth daily. 06/06/21   Rafael Bihari, FNP    Physical Exam: Vitals:   06/09/21 0719 06/09/21 0724 06/09/21 0729 06/09/21 0759  BP:   (!) 106/54 127/63  Pulse: (!)  103 98 96 94  Resp: '19 19 19 '$ (!) 22  Temp:      TempSrc:  SpO2: 95% 96% 96% 97%  Weight:      Height:        Constitutional: frail elderly appears terminal on bipap, somnolent, moderately distressed.  Eyes: PERRL, lids and conjunctivae normal ENMT: on bipap.  Neck: normal, supple, no masses, no thyromegaly Respiratory: poor air movement, tight lungs bilateral.   Cardiovascular: normal s1, s2 sounds, no murmurs / rubs / gallops. No extremity edema. 2+ pedal pulses. No carotid bruits.  Abdomen: no tenderness, no masses palpated. No hepatosplenomegaly. Bowel sounds positive.  Musculoskeletal: no clubbing / cyanosis. No joint deformity upper and lower extremities. Good ROM, no contractures. Normal muscle tone.  Skin: no rashes, lesions, ulcers. No induration Neurologic: no gross focal deficits found.  Psychiatric:  judgment and insight UTD. Somnolent (medicated)  Labs on Admission: I have personally reviewed following labs and imaging studies  CBC: Recent Labs  Lab 06/09/21 0658  WBC 16.0*  NEUTROABS 12.9*  HGB 10.1*  HCT 32.3*  MCV 103.5*  PLT 123XX123*   Basic Metabolic Panel: Recent Labs  Lab 06/06/21 1234 06/09/21 0658  NA 140 136  K 3.6 3.7  CL 95* 97*  CO2 31 29  GLUCOSE 73 261*  BUN 36* 46*  CREATININE 1.40* 1.81*  CALCIUM 9.2 8.9   GFR: Estimated Creatinine Clearance: 18.4 mL/min (A) (by C-G formula based on SCr of 1.81 mg/dL (H)). Liver Function Tests: Recent Labs  Lab 06/09/21 0658  AST 30  ALT 20  ALKPHOS 58  BILITOT 1.1  PROT 7.3  ALBUMIN 4.1   No results for input(s): LIPASE, AMYLASE in the last 168 hours. No results for input(s): AMMONIA in the last 168 hours. Coagulation Profile: No results for input(s): INR, PROTIME in the last 168 hours. Cardiac Enzymes: No results for input(s): CKTOTAL, CKMB, CKMBINDEX, TROPONINI in the last 168 hours. BNP (last 3 results) No results for input(s): PROBNP in the last 8760 hours. HbA1C: No results for  input(s): HGBA1C in the last 72 hours. CBG: No results for input(s): GLUCAP in the last 168 hours. Lipid Profile: No results for input(s): CHOL, HDL, LDLCALC, TRIG, CHOLHDL, LDLDIRECT in the last 72 hours. Thyroid Function Tests: No results for input(s): TSH, T4TOTAL, FREET4, T3FREE, THYROIDAB in the last 72 hours. Anemia Panel: No results for input(s): VITAMINB12, FOLATE, FERRITIN, TIBC, IRON, RETICCTPCT in the last 72 hours. Urine analysis:    Component Value Date/Time   COLORURINE YELLOW 04/06/2021 1304   APPEARANCEUR CLEAR 04/06/2021 1304   APPEARANCEUR Clear 05/16/2018 1629   LABSPEC 1.010 04/06/2021 1304   PHURINE 5.0 04/06/2021 1304   GLUCOSEU 150 (A) 04/06/2021 1304   HGBUR MODERATE (A) 04/06/2021 1304   BILIRUBINUR NEGATIVE 04/06/2021 1304   BILIRUBINUR Negative 05/16/2018 [place] 04/06/2021 1304   PROTEINUR NEGATIVE 04/06/2021 1304   UROBILINOGEN negative 06/09/2014 1043   NITRITE NEGATIVE 04/06/2021 1304   LEUKOCYTESUR NEGATIVE 04/06/2021 1304    Radiological Exams on Admission: DG Chest Hannah 1 View  Result Date: 06/09/2021 CLINICAL DATA:  Short of breath.  History of cancer. EXAM: PORTABLE CHEST 1 VIEW COMPARISON:  05/02/2021 FINDINGS: Interval development of diffuse interstitial densities throughout both lungs. Small right effusion noted. Heart size upper normal. Atherosclerotic aortic arch. IMPRESSION: Interval development of diffuse interstitial edema most likely due to fluid overload. Small right effusion. Electronically Signed   By: Franchot Gallo M.D.   On: 06/09/2021 07:58    EKG: Independently reviewed. Sinus tachycardia   Assessment/Plan Principal Problem:   Acute and chronic respiratory failure  with hypoxia ([place]) Active Problems:   Hyperlipidemia associated with type 2 diabetes mellitus ([place])   Mitral valve disease   PVD   Carotid stenosis   Type 2 diabetes mellitus ([place])   Tobacco abuse   Stage 3 severe COPD by GOLD classification  ([place])   Coronary artery disease involving native coronary artery of native heart with angina pectoris (HCC)   Hyperlipidemia   Chronic combined systolic and diastolic CHF (congestive heart failure) (HCC)   Vitamin D deficiency   Cigarette smoker   Leukocytosis   CKD (chronic kidney disease), stage III (HCC)   Hyperglycemia due to diabetes mellitus (HCC)   Chronic respiratory failure with hypoxia and hypercapnia ([place])   DNR (do not resuscitate)   Hospice care patient   Anemia   Thrombocytosis   Acute on chronic respiratory failure with hypoxia-likely this is a combination of her advanced lung disease COPD exacerbation superimposed with in a systolic heart failure exacerbation.  She has been treated with IV Lasix for diuresis continue BiPAP therapy wean as able and would not escalate therapy given DNR and hospice status.  I discussed this with her daughter at the bedside.  Primary goal of care should be focusing on comfort.   Acute systolic heart failure exacerbation-patient had recently been started on torsemide however symptoms persisted and now in acute exacerbation.  She has been started on IV Lasix for diuresis and will follow closely monitoring intake and output and electrolytes closely.  Did not repeat 2D echo as she had it done 2 months ago with an EF reported as 25-30%.  Type 2 diabetes mellitus with complications-patient having some steroid-induced hyperglycemia which we will treat as needed with supplemental sliding scale coverage and CBG monitoring.  Anemia in chronic kidney disease-continue to monitor CBC daily and transfuse as needed for comfort.  No signs of active bleeding at this time.   Leukocytosis-likely secondary to steroid use monitor CBC with differential.   Coronary artery disease-no signs of active chest pain symptoms at this time.  EKG with no acute findings.  Follow clinically.  Hyperlipidemia-resume home medications when fully reconciled.  Peripheral vascular  disease-currently stable.  Tobacco-discussed with daughter at bedside was strongly advised patient to stop all tobacco use but given that she is now on hospice they may decide to allow her to continue smoking regardless of the consequences.  When patient is awake would like to discuss with her and offer a nicotine patch.  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  DNR-confirmed with daughter at bedside continue DNR order in hospital with no plans to escalate.  I did ask for a palliative medicine consultation.    DVT prophylaxis: Subcu heparin Code Status: DNR Family Communication: Daughter at bedside Disposition Plan: To be determined Consults called: Palliative medicine Admission status: Inpatient Level of care: Stepdown Irwin Brakeman MD [facility] How to contact the [facility] Attending or Consulting provider Comfort or covering provider during after hours [place], for this patient?  Check the care team in [facility] and look for a) attending/consulting [facility] provider listed and b) the [facility] team listed Log into [email or web address] and use [place]'s universal password to access. If you do not have the password, please contact the hospital operator. Locate the [facility] provider you are looking for under [facility] and page to a number that you can be directly reached. If you still have difficulty reaching the provider, please page the [facility] (Director on Call) for the Hospitalists listed  on amion for assistance.   If 7PM-7AM, please contact night-coverage [email or web address] Password [facility]  06/09/2021, 8:39 AM

## 2021-06-09 NOTE — ED Provider Notes (Signed)
Emergency Medicine Provider Triage Evaluation Note  Hannah French , a 80 y.o. female  was evaluated in triage.  Pt complains of shortness of breath/respiratory distress.  Review of Systems  Positive: Unable to be obtained secondary to acuity of condition Negative: Unable to be obtained secondary to acuity of condition  Physical Exam  BP (!) 133/95   Pulse (!) 133   Temp 97.7 F (36.5 C) (Axillary)   Resp (!) 28   Ht '4\' 11"'$  (1.499 m)   Wt 53 kg   SpO2 100%   BMI 23.59 kg/m  Gen:   Awake, alert, and noted to be in respiratory distress.  She appears very anxious. Resp:  Patient in moderate respiratory distress.  There are expiratory rhonchi present. MSK:   Moves extremities without difficulty  Other:  Extremities with trace pitting edema.  Medical Decision Making  Medically screening exam initiated at 6:41 AM.  Appropriate orders placed.  Ulrica Kobbe was informed that the remainder of the evaluation will be completed by another provider, this initial triage assessment does not replace that evaluation, and the importance of remaining in the ED until their evaluation is complete.  Work-up initiated including laboratory studies, EKG, portable chest x-ray.  Patient received Solu-Medrol by EMS.  She will be given albuterol by inhaler pending results of COVID test.  Patient also seems very anxious and will be given Ativan for this.   Veryl Speak, MD 06/10/21 [phone]

## 2021-06-09 NOTE — ED Provider Notes (Signed)
[facility] EMERGENCY DEPARTMENT Provider Note   CSN: [id number] Arrival date & time: 06/09/21  [id number]     History Chief Complaint  Patient presents with   Respiratory Distress    Hannah French is a 80 y.o. female.  Pt presents to the ED today with sob.  She told EMS it started about 30 minutes pta.  Pt has a hx of severe COPD and is on hospice with a DNR form.  Pt given 125 mg solumedrol by EMS and put on 15L NRB.  When pt arrived, she was seen by Dr. Stark Jock and was given ativan 1 mg IV and put on bipap.  She is now somnolent, but arousable.  She is not able to give me any hx.       Past Medical History:  Diagnosis Date   Abdominal bruit    Bladder cancer ([place])    CAD (coronary artery disease)    a. nonobstructive by cath 08/2018.   Carotid bruit    Chronic combined systolic and diastolic CHF (congestive heart failure) (HCC)    CKD (chronic kidney disease), stage II    Congestive heart failure (CHF) (HCC)    Diabetes mellitus    Type II   Dyslipidemia    Heart murmur    Mild pulmonary hypertension (HCC)    Mitral valve prolapse    a. not seen on most recent echoes. Mild MR now.   NICM (nonischemic cardiomyopathy) ([place])    Osteoporosis    Pancreatitis, acute    Pseudogout    PVD (peripheral vascular disease) ([place])    a. 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   Sinus tachycardia    Tobacco abuse     Patient Active Problem List   Diagnosis Date Noted   Acute respiratory failure ([place]) 04/06/2021   Chronic respiratory failure with hypoxia and hypercapnia (HCC) 04/06/2021   Syncope 02/19/2021   Acute GI bleeding 02/19/2021   Acute on chronic blood loss anemia 02/19/2021   Acute respiratory failure with hypoxia (HCC) 01/17/2021   Elevated MCV 01/17/2021   Leukocytosis 01/17/2021   CKD (chronic kidney disease), stage III ([place]) 01/17/2021   Hyperglycemia due to diabetes mellitus ([place]) 01/17/2021   Skin abscess  01/15/2020   Depression, recurrent ([place]) 08/08/2019   Cigarette smoker 06/19/2019   Vitamin D deficiency 01/29/2019   Dizziness 12/03/2018   Chronic combined systolic and diastolic CHF (congestive heart failure) ([place]) 12/03/2018   Coronary artery disease involving native coronary artery of native heart with angina pectoris ([place]) 10/04/2018   Hyperlipidemia 10/04/2018   Pain in right hand 10/02/2018   Myalgia due to statin 03/29/2018   Stage 3 severe COPD by GOLD classification ([place]) 10/26/2017   Tobacco abuse 03/07/2016   Type 2 diabetes mellitus ([place]) 08/26/2015   Osteoporosis 09/02/2014   Carotid stenosis 07/22/2012   PVD 07/02/2009   BLADDER CANCER 06/05/2009   Hyperlipidemia associated with type 2 diabetes mellitus ([place]) 06/05/2009   PSEUDOGOUT 06/05/2009   MITRAL VALVE PROLAPSE 06/05/2009    Past Surgical History:  Procedure Laterality Date   ABDOMINAL AORTOGRAM W/LOWER EXTREMITY N/A 12/31/2019   Procedure: ABDOMINAL AORTOGRAM W/LOWER EXTREMITY;  Surgeon: Wellington Hampshire, MD;  Location: [place] [facility];  Service: Cardiovascular;  Laterality: N/A;   ABDOMINAL AORTOGRAM W/LOWER EXTREMITY N/A 09/01/2020   Procedure: ABDOMINAL AORTOGRAM W/LOWER EXTREMITY;  Surgeon: Wellington Hampshire, MD;  Location: [place] [facility];  Service: Cardiovascular;  Laterality: N/A;   APPENDECTOMY  CORONARY STENT INTERVENTION N/A 10/13/2020   Procedure: CORONARY STENT INTERVENTION;  Surgeon: Leonie Man, MD;  Location: [place] [facility];  Service: Cardiovascular;  Laterality: N/A;   Hysterectomy-type unspecified     INTRAVASCULAR PRESSURE WIRE/FFR STUDY N/A 10/13/2020   Procedure: INTRAVASCULAR PRESSURE WIRE/FFR STUDY;  Surgeon: Leonie Man, MD;  Location: [place] [facility];  Service: Cardiovascular;  Laterality: N/A;   LAPAROSCOPIC CHOLECYSTECTOMY  2009   PERIPHERAL VASCULAR INTERVENTION  12/31/2019   Procedure: PERIPHERAL VASCULAR INTERVENTION;  Surgeon: Wellington Hampshire, MD;   Location: [place] [facility];  Service: Cardiovascular;;  Bilateral Ili[place]   RIGHT/LEFT HEART CATH AND CORONARY ANGIOGRAPHY N/A 09/16/2018   Procedure: RIGHT/LEFT HEART CATH AND CORONARY ANGIOGRAPHY;  Surgeon: Sherren Mocha, MD;  Location: [place] [facility];  Service: Cardiovascular;  Laterality: N/A;   RIGHT/LEFT HEART CATH AND CORONARY ANGIOGRAPHY N/A 10/13/2020   Procedure: RIGHT/LEFT HEART CATH AND CORONARY ANGIOGRAPHY;  Surgeon: Jolaine Artist, MD;  Location: [place] [facility];  Service: Cardiovascular;  Laterality: N/A;     OB History   No obstetric history on file.     Family History  Problem Relation Age of Onset   Heart failure Mother    Hypertension Sister    Heart attack Brother    Heart attack Maternal Uncle    Heart disease Sister    COPD Brother    Congestive Heart Failure Brother    Congestive Heart Failure Daughter    Rheum arthritis Son    Ovarian cancer Sister    Coronary artery disease Neg Hx        Early    Social History   Tobacco Use   Smoking status: Former    Packs/day: 1.00    Years: 50.00    Pack years: 50.00    Types: Cigarettes    Quit date: 04/03/2021    Years since quitting: 0.1   Smokeless tobacco: Never  Vaping Use   Vaping Use: Never used  Substance Use Topics   Alcohol use: No   Drug use: No    Home Medications Prior to Admission medications   Medication Sig Start Date End Date Taking? Authorizing Provider  acetaminophen (TYLENOL) 500 MG tablet Take 500 mg by mouth every 6 (six) hours as needed for moderate pain.    [provider]  albuterol (VENTOLIN HFA) 108 (90 Base) MCG/ACT inhaler Inhale 2 puffs into the lungs every 6 (six) hours as needed for wheezing or shortness of breath. 02/02/21   Martyn Ehrich, NP  Cholecalciferol (VITAMIN D) 50 MCG (2000 UT) tablet Take 4,000 Units by mouth daily.     [provider]  clopidogrel (PLAVIX) 75 MG tablet Take 1 tablet (75 mg total) by mouth daily. 10/13/20    Cheryln Manly, NP  dextromethorphan-guaiFENesin (MUCINEX DM) 30-600 MG 12hr tablet Take 2 tablets by mouth 2 (two) times daily as needed for cough. 05/03/21   Evelina Dun A, FNP  empagliflozin (JARDIANCE) 10 MG TABS tablet Take 10 mg by mouth daily.    [provider]  Fluticasone-Umeclidin-Vilant (TRELEGY ELLIPTA) 100-62.5-25 MCG/INH AEPB Inhale 1 puff into the lungs daily. 02/02/21   Martyn Ehrich, NP  Glucose Blood (BLOOD GLUCOSE TEST STRIPS) STRP 1 strip by In Vitro route 2 (two) times daily. 04/01/21   Dettinger, Fransisca Kaufmann, MD  insulin aspart (NOVOLOG) 100 UNIT/ML injection Inject 0-9 Units into the skin 3 (three) times daily with meals. Sliding Scale CBG 70 - 120: 0  units  CBG 121 - 150: 1 unit  CBG 151 - 200: 2 units  CBG 201 - 250: 3 units  CBG 251 - 300: 5 units  CBG 301 - 350: 7 units  CBG 351 - 400: 9 units  CBG > 400: give 9 units and call your MD for additional instructions 04/12/21   Elodia Florence., MD  insulin degludec (TRESIBA FLEXTOUCH) 100 UNIT/ML FlexTouch Pen Inject 7 Units into the skin daily. 04/12/21   Elodia Florence., MD  levalbuterol Penne Lash) 0.63 MG/3ML nebulizer solution Inhale into the lungs. 04/18/21   [provider]  LORazepam (ATIVAN) 0.5 MG tablet Take 0.5 mg by mouth every 3 (three) hours as needed. 04/26/21   [provider]  Morphine Sulfate (MORPHINE CONCENTRATE) 10 mg / 0.5 ml concentrated solution 10 mg. 04/16/21   [provider]  Naphazoline-Pheniramine (OPCON-A) 0.027-0.315 % SOLN Place 1 drop into both eyes daily as needed (Burning and itching eyes).    [provider]  nitroGLYCERIN (NITRODUR - DOSED IN MG/24 HR) 0.2 mg/hr patch Place 1 patch (0.2 mg total) onto the skin daily. 11/02/20   Newt Minion, MD  nitroGLYCERIN (NITROSTAT) 0.3 MG SL tablet Place 1 tablet (0.3 mg total) under the tongue every 5 (five) minutes as needed for chest pain. 02/02/21 02/02/22  Dettinger, Fransisca Kaufmann, MD   ondansetron (ZOFRAN) 4 MG tablet Take 1 tablet (4 mg total) by mouth every 4 (four) hours as needed for nausea or vomiting. 05/03/21   Evelina Dun A, FNP  OXYGEN Inhale 5 Doses into the lungs continuous. 5 liters n/c    [provider]  pentoxifylline (TRENTAL) 400 MG CR tablet TAKE  (1)  TABLET  THREE TIMES DAILY WITH MEALS. 03/10/21   Newt Minion, MD  potassium chloride SA (KLOR-CON) 20 MEQ tablet Take 2 tablets (40 mEq total) by mouth as directed. Only when you take Metolazone 04/04/21   Bensimhon, Shaune Pascal, MD  predniSONE (DELTASONE) 10 MG tablet Take  4 each am x 2 days,   2 each am x 2 days,  1 each am x 2 days and stop 06/06/21   Tanda Rockers, MD  revefenacin (YUPELRI) 175 MCG/3ML nebulizer solution Take 3 mLs (175 mcg total) by nebulization daily. 06/06/21   Tanda Rockers, MD  sennosides-docusate sodium (SENOKOT-S) 8.6-50 MG tablet Take 1 tablet by mouth daily. 04/04/21   Dettinger, Fransisca Kaufmann, MD  Torsemide 40 MG TABS Take 60 mg by mouth daily. 06/06/21   Rafael Bihari, FNP    Allergies    Contrast media [iodinated diagnostic agents], Iohexol, Metformin, Statins, Zetia [ezetimibe], Bextra [valdecoxib], Cefdinir, Cozaar [losartan potassium], Gabapentin, Nsaids, Rofecoxib, and Sulfa antibiotics  Review of Systems   Review of Systems  Respiratory:  Positive for shortness of breath.   All other systems reviewed and are negative.  Physical Exam Updated Vital Signs BP 127/63   Pulse 94   Temp 97.7 F (36.5 C) (Axillary)   Resp (!) 22   Ht '4\' 11"'$  (1.499 m)   Wt 53 kg   SpO2 97%   BMI 23.59 kg/m   Physical Exam Vitals and nursing note reviewed.  HENT:     Head: Normocephalic and atraumatic.     Nose: Nose normal.     Mouth/Throat:     Mouth: Mucous membranes are moist.  Eyes:     Extraocular Movements: Extraocular movements intact.     Conjunctiva/sclera: Conjunctivae normal.  Pupils: Pupils are equal, round, and reactive to light.  Cardiovascular:      Rate and Rhythm: Regular rhythm. Tachycardia present.     Pulses: Normal pulses.  Pulmonary:     Effort: Respiratory distress present.     Breath sounds: Wheezing present.  Abdominal:     General: Abdomen is flat. Bowel sounds are normal.     Palpations: Abdomen is soft.  Musculoskeletal:        General: Normal range of motion.     Cervical back: Normal range of motion and neck supple.  Skin:    General: Skin is warm.     Capillary Refill: Capillary refill takes less than 2 seconds.  Neurological:     Mental Status: She is lethargic.    ED Results / Procedures / Treatments   Labs (all labs ordered are listed, but only abnormal results are displayed) Labs Reviewed  COMPREHENSIVE METABOLIC PANEL - Abnormal; Notable for the following components:      Result Value   Chloride 97 (*)    Glucose, Bld 261 (*)    BUN 46 (*)    Creatinine, Ser 1.81 (*)    GFR, Estimated 28 (*)    All other components within normal limits  CBC WITH DIFFERENTIAL/PLATELET - Abnormal; Notable for the following components:   WBC 16.0 (*)    RBC 3.12 (*)    Hemoglobin 10.1 (*)    HCT 32.3 (*)    MCV 103.5 (*)    RDW 22.8 (*)    Platelets 549 (*)    nRBC 1.3 (*)    Neutro Abs 12.9 (*)    Abs Immature Granulocytes 0.16 (*)    All other components within normal limits  BRAIN NATRIURETIC PEPTIDE - Abnormal; Notable for the following components:   B Natriuretic Peptide 653.0 (*)    All other components within normal limits  LACTIC ACID, PLASMA - Abnormal; Notable for the following components:   Lactic Acid, Venous 2.0 (*)    All other components within normal limits  BLOOD GAS, ARTERIAL - Abnormal; Notable for the following components:   pH, Arterial 7.314 (*)    pCO2 arterial 60.3 (*)    pO2, Arterial 75.5 (*)    Acid-Base Excess 4.0 (*)    All other components within normal limits  RESP PANEL BY RT-PCR (FLU A&B, COVID) ARPGX2  LACTIC ACID, PLASMA  TROPONIN I (HIGH SENSITIVITY)  TROPONIN I (HIGH  SENSITIVITY)    EKG EKG Interpretation  Date/Time:  Thursday June 09 2021 06:29:34 EDT Ventricular Rate:  137 PR Interval:  52 QRS Duration: 131 QT Interval:  342 QTC Calculation: 517 R Axis:   -69 Text Interpretation: Sinus tachycardia with irregular rate Prominent P waves, nondiagnostic Left bundle branch block Artifact in lead(s) I II III aVR aVL aVF V1 V2 V3 V4 V5 V6 and baseline wander in lead(s) II III No significant change since 04/08/2021 Confirmed by Veryl Speak [phone]) on 06/09/2021 6:40:21 AM  Radiology DG Chest Port 1 View  Result Date: 06/09/2021 CLINICAL DATA:  Short of breath.  History of cancer. EXAM: PORTABLE CHEST 1 VIEW COMPARISON:  05/02/2021 FINDINGS: Interval development of diffuse interstitial densities throughout both lungs. Small right effusion noted. Heart size upper normal. Atherosclerotic aortic arch. IMPRESSION: Interval development of diffuse interstitial edema most likely due to fluid overload. Small right effusion. Electronically Signed   By: Franchot Gallo M.D.   On: 06/09/2021 07:58    Procedures Procedures   Medications Ordered in  ED Medications  albuterol (VENTOLIN HFA) 108 (90 Base) MCG/ACT inhaler 6 puff (6 puffs Inhalation Not Given 06/09/21 0701)  sodium chloride 0.9 % bolus 500 mL (has no administration in time range)  LORazepam (ATIVAN) injection 1 mg (1 mg Intravenous Given 06/09/21 0631)  magnesium sulfate IVPB 2 g 50 mL (0 g Intravenous Stopped 06/09/21 0812)  ipratropium (ATROVENT) 0.02 % nebulizer solution (0.5 mg  Given 06/09/21 0641)  albuterol (PROVENTIL) (2.5 MG/3ML) 0.083% nebulizer solution (5 mg  Given 06/09/21 [id number])    ED Course  I have reviewed the triage vital signs and the nursing notes.  Pertinent labs & imaging results that were available during my care of the patient were reviewed by me and considered in my medical decision making (see chart for details).    MDM Rules/Calculators/A&P                           Pt  looks much more comfortable on bipap.  HR has come down and she is breathing easier.  Lethargy is likely due to ativan, not hypercarbia as CO2 only 60 and pH 7.3.    Pt does have some mild aki and lactic is 2.  She is not fluid overloaded clinically, although CXR shows some mild edema.  She is given gentle hydration.  Covid negative.  I spoke with pt about DNR status.  She said they just did it a few days ago.  She said definitely no CPR.  She is a little up in the air about intubating.  She said if there is no chance of improvement, then do not intubate.  If she could get better, then intubate.  This will need to be cleared up with patient when she can answer.  Pt d/w Dr. Wynetta Emery (triad) for admission.  CRITICAL CARE Performed by: Isla Pence   Total critical care time: 30 minutes  Critical care time was exclusive of separately billable procedures and treating other patients.  Critical care was necessary to treat or prevent imminent or life-threatening deterioration.  Critical care was time spent personally by me on the following activities: development of treatment plan with patient and/or surrogate as well as nursing, discussions with consultants, evaluation of patient's response to treatment, examination of patient, obtaining history from patient or surrogate, ordering and performing treatments and interventions, ordering and review of laboratory studies, ordering and review of radiographic studies, pulse oximetry and re-evaluation of patient's condition.   Hannah French was evaluated in Emergency Department on 06/09/2021 for the symptoms described in the history of present illness. She was evaluated in the context of the global COVID-19 pandemic, which necessitated consideration that the patient might be at risk for infection with the SARS-CoV-2 virus that causes COVID-19. Institutional protocols and algorithms that pertain to the evaluation of patients at risk for COVID-19 are  in a state of rapid change based on information released by regulatory bodies including the CDC and federal and state organizations. These policies and algorithms were followed during the patient's care in the ED.  Final Clinical Impression(s) / ED Diagnoses Final diagnoses:  COPD exacerbation ([place])  Acute on chronic respiratory failure with hypoxia and hypercapnia ([place])  AKI (acute kidney injury) [facility])    Rx / DC Orders ED Discharge Orders     None        Isla Pence, MD 06/09/21 [phone]

## 2021-06-09 NOTE — ED Triage Notes (Addendum)
Pt brought in [facility] from home with severe SOB that started about 30 mins PTA. Pt arrives on 15L NRB.  Pt took breathing treatments at home prior to calling EMS. Pt given 125 solumedrol by EMS.

## 2021-06-10 ENCOUNTER — Other Ambulatory Visit: Payer: Self-pay

## 2021-06-10 ENCOUNTER — Inpatient Hospital Stay (HOSPITAL_COMMUNITY): Payer: Medicare Other

## 2021-06-10 DIAGNOSIS — N1832 Chronic kidney disease, stage 3b: Secondary | ICD-10-CM

## 2021-06-10 DIAGNOSIS — Z66 Do not resuscitate: Secondary | ICD-10-CM

## 2021-06-10 DIAGNOSIS — I6523 Occlusion and stenosis of bilateral carotid arteries: Secondary | ICD-10-CM

## 2021-06-10 LAB — BASIC METABOLIC PANEL
Anion gap: 10 (ref 5–15)
BUN: 52 mg/dL — ABNORMAL HIGH (ref 8–23)
CO2: 29 mmol/L (ref 22–32)
Calcium: 8.7 mg/dL — ABNORMAL LOW (ref 8.9–10.3)
Chloride: 97 mmol/L — ABNORMAL LOW (ref 98–111)
Creatinine, Ser: 1.78 mg/dL — ABNORMAL HIGH (ref 0.44–1.00)
GFR, Estimated: 29 mL/min — ABNORMAL LOW (ref >60)
Glucose, Bld: 243 mg/dL — ABNORMAL HIGH (ref 70–99)
Potassium: 4.2 mmol/L (ref 3.5–5.1)
Sodium: 136 mmol/L (ref 135–145)

## 2021-06-10 LAB — CBC WITH DIFFERENTIAL/PLATELET
Abs Immature Granulocytes: 0.04 10*3/uL (ref 0.00–0.07)
Basophils Absolute: 0 10*3/uL (ref 0.0–0.1)
Basophils Relative: 0 %
Eosinophils Absolute: 0 10*3/uL (ref 0.0–0.5)
Eosinophils Relative: 0 %
HCT: 27.3 % — ABNORMAL LOW (ref 36.0–46.0)
Hemoglobin: 8.7 g/dL — ABNORMAL LOW (ref 12.0–15.0)
Immature Granulocytes: 1 %
Lymphocytes Relative: 9 %
Lymphs Abs: 0.7 10*3/uL (ref 0.7–4.0)
MCH: 32.8 pg (ref 26.0–34.0)
MCHC: 31.9 g/dL (ref 30.0–36.0)
MCV: 103 fL — ABNORMAL HIGH (ref 80.0–100.0)
Monocytes Absolute: 0.3 10*3/uL (ref 0.1–1.0)
Monocytes Relative: 4 %
Neutro Abs: 6.7 10*3/uL (ref 1.7–7.7)
Neutrophils Relative %: 86 %
Platelets: 396 10*3/uL (ref 150–400)
RBC: 2.65 MIL/uL — ABNORMAL LOW (ref 3.87–5.11)
RDW: 22.8 % — ABNORMAL HIGH (ref 11.5–15.5)
WBC: 7.8 10*3/uL (ref 4.0–10.5)
nRBC: 0.9 % — ABNORMAL HIGH (ref 0.0–0.2)

## 2021-06-10 LAB — GLUCOSE, CAPILLARY
Glucose-Capillary: 221 mg/dL — ABNORMAL HIGH (ref 70–99)
Glucose-Capillary: 100 mg/dL — ABNORMAL HIGH (ref 70–99)
Glucose-Capillary: 91 mg/dL (ref 70–99)
Glucose-Capillary: 112 mg/dL — ABNORMAL HIGH (ref 70–99)

## 2021-06-10 LAB — CBG MONITORING, ED
Glucose-Capillary: 230 mg/dL — ABNORMAL HIGH (ref 70–99)
Glucose-Capillary: 225 mg/dL — ABNORMAL HIGH (ref 70–99)

## 2021-06-10 LAB — MAGNESIUM: Magnesium: 2.8 mg/dL — ABNORMAL HIGH (ref 1.7–2.4)

## 2021-06-10 LAB — BRAIN NATRIURETIC PEPTIDE: B Natriuretic Peptide: 842 pg/mL — ABNORMAL HIGH (ref 0.0–100.0)

## 2021-06-10 MED ORDER — INSULIN ASPART 100 UNIT/ML IJ SOLN
6.0000 [IU] | Freq: Three times a day (TID) | INTRAMUSCULAR | Status: DC
  Administered 2021-06-11 (×3): 6 [IU] via SUBCUTANEOUS

## 2021-06-10 MED ORDER — FUROSEMIDE 10 MG/ML IJ SOLN
60.0000 mg | Freq: Two times a day (BID) | INTRAMUSCULAR | Status: DC
  Administered 2021-06-10 – 2021-06-13 (×6): 60 mg via INTRAVENOUS
  Filled 2021-06-10 (×6): qty 6

## 2021-06-10 MED ORDER — INSULIN GLARGINE-YFGN 100 UNIT/ML ~~LOC~~ SOLN
14.0000 [IU] | Freq: Every day | SUBCUTANEOUS | Status: DC
  Administered 2021-06-10 – 2021-06-11 (×2): 14 [IU] via SUBCUTANEOUS
  Filled 2021-06-10 (×4): qty 0.14

## 2021-06-10 MED ORDER — INSULIN ASPART 100 UNIT/ML IJ SOLN
8.0000 [IU] | Freq: Three times a day (TID) | INTRAMUSCULAR | Status: DC
  Administered 2021-06-10 (×3): 8 [IU] via SUBCUTANEOUS
  Filled 2021-06-10: qty 1

## 2021-06-10 MED ORDER — IPRATROPIUM-ALBUTEROL 0.5-2.5 (3) MG/3ML IN SOLN: 3.0000 mL | RESPIRATORY_TRACT | Status: DC | PRN

## 2021-06-10 MED ORDER — DOXYCYCLINE HYCLATE 100 MG PO TABS
100.0000 mg | ORAL_TABLET | Freq: Two times a day (BID) | ORAL | Status: DC
  Administered 2021-06-10 – 2021-06-13 (×6): 100 mg via ORAL
  Filled 2021-06-10 (×6): qty 1

## 2021-06-10 NOTE — ED Notes (Signed)
Admitting provider at bedside.

## 2021-06-10 NOTE — Progress Notes (Signed)
PROGRESS NOTE   Hannah French  [id number] DOB: [birth year] DOA: 06/09/2021 PCP: Dettinger, Fransisca Kaufmann, MD   Chief Complaint  Patient presents with   Respiratory Distress   Level of care: Telemetry  Brief Admission History:  80 y.o. 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  She is DNR.  She has been having difficulty with increasing dyspnea over the past weeks.  She has had increasing weight gain and reported to her daughter that she is afraid to sleep alone at night.  Daughter has been staying with her for the past several days and reports recently changed to torsemide due to difficulty diuresing with Lasix.  She continues to have shortness of breath that acutely became worse in the early morning hours prompting daughter to call EMS.  They gave her a dose of Solu-Medrol 125 mg and started her on 15 L nonrebreather due to respiratory distress.   Assessment & Plan:   Principal Problem:   Acute and chronic respiratory failure with hypoxia (HCC) Active Problems:   Hyperlipidemia associated with type 2 diabetes mellitus (HCC)   Mitral valve disease   PVD   Carotid stenosis   Type 2 diabetes mellitus (HCC)   Tobacco abuse   Stage 3 severe COPD by GOLD classification ([place])   Coronary artery disease involving native coronary artery of native heart with angina pectoris (HCC)   Hyperlipidemia   Chronic combined systolic and diastolic CHF (congestive heart failure) (HCC)   Vitamin D deficiency   Cigarette smoker   Acute respiratory failure with hypoxia (HCC)   Leukocytosis   CKD (chronic kidney  disease), stage III (HCC)   Hyperglycemia due to diabetes mellitus ([place])   Chronic respiratory failure with hypoxia and hypercapnia ( [place])   DNR (do not resuscitate)   Hospice care patient   Anemia   Thrombocytosis   Acute on chronic respiratory failure with hypoxia-likely this is a combination of her advanced lung disease COPD exacerbation superimposed with in a systolic heart failure exacerbation.  She has been treated with IV Lasix for diuresis continue BiPAP therapy wean as able and would not escalate therapy given DNR and hospice status.  I discussed this with her daughter at the bedside.  Primary goal of care should be focusing on comfort.  She is off bipap this morning and some improvement seen. Continue treatments.    Acute systolic heart failure exacerbation-patient had recently been started on torsemide however symptoms persisted and now in acute exacerbation.  She has been started on IV Lasix for diuresis and will follow closely monitoring intake and output and electrolytes closely.  Did not repeat 2D echo as she had it done 2 months ago with an EF reported as 25-30%.  Increase IV lasix to 60 mg BID.    Type 2 diabetes mellitus with complications-patient having some steroid-induced hyperglycemia which we will treat as needed with supplemental sliding scale coverage and CBG monitoring.   CBG (last 3)  Recent Labs    06/10/21 1141 06/10/21 1632 06/10/21 1747  GLUCAP 221* 100* 91      Anemia in chronic kidney disease-continue to monitor CBC daily and transfuse as needed for comfort.  No signs of  active bleeding at this time.  STable    Leukocytosis-likely secondary to steroid use monitor CBC with differential.   Coronary artery disease-no signs of active chest pain symptoms at this time.  EKG with no acute findings.  Follow clinically.  Hyperlipidemia-resume home medications when fully reconciled.  Peripheral vascular disease-currently stable.   Tobacco-discussed with  daughter at bedside was strongly advised patient to stop all tobacco use but given that she is now on hospice they may decide to allow her to continue smoking regardless of the consequences.  When patient is awake would like to discuss with her and offer a nicotine patch.  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  DNR-confirmed with daughter at bedside continue DNR order in hospital with no plans to escalate.  I did ask for a palliative medicine consultation.     DVT prophylaxis: SQ heparin Code Status: DNR Family Communication:  Disposition: anticipate home with hospice Status is: Inpatient  Remains inpatient appropriate because:IV treatments appropriate due to intensity of illness or inability to take PO and Inpatient level of care appropriate due to severity of illness  Dispo: The patient is from: Home              Anticipated d/c is to: Home              Patient currently is not medically stable to d/c.   Difficult to place patient No       Consultants:  N/a  Procedures:  N/a  Antimicrobials:  N/a   Subjective: Pt reports that she is not urinating as much as she feels she needs to, no CP, no palpitations.   Objective: Vitals:   06/10/21 1104 06/10/21 1353 06/10/21 1504 06/10/21 1700  BP: 125/81  131/71 (!) 114/92  Pulse: (!) 102  (!) 103 99  Resp: '20  20 20  '$ Temp:   98.2 F (36.8 C) 98 F (36.7 C)  TempSrc:   Oral Oral  SpO2: 97% 99% 100% 100%  Weight:      Height:        Intake/Output Summary (Last 24 hours) at 06/10/2021 1836 Last data filed at 06/10/2021 1804 Gross per 24 hour  Intake 751.12 ml  Output --  Net 751.12 ml   Filed Weights   06/09/21 0624  Weight: 53 kg    Examination:  General exam: frail elderly female, awake, alert, Appears calm and comfortable  Respiratory system: poor air movement, diffuse crackles and rales.. Cardiovascular system: normal S1 & S2 heard. No JVD, murmurs, rubs, gallops or clicks.  No pedal edema. Gastrointestinal system: Abdomen is nondistended, soft and nontender. No organomegaly or masses felt. Normal bowel sounds heard. Central nervous system: Alert and oriented. No focal neurological deficits. Extremities: Symmetric 5 x 5 power. Skin: No rashes, lesions or ulcers Psychiatry: Judgement and insight appear normal. Mood & affect appropriate.   Data Reviewed: I have personally reviewed following labs and imaging studies  CBC: Recent Labs  Lab 06/09/21 0658 06/10/21 0434  WBC 16.0* 7.8  NEUTROABS 12.9* 6.7  HGB 10.1* 8.7*  HCT 32.3* 27.3*  MCV 103.5* 103.0*  PLT 549* [id number]    Basic Metabolic Panel: Recent Labs  Lab 06/06/21 1234 06/09/21 0658 06/10/21 0434  NA 140 136 136  K 3.6 3.7 4.2  CL 95* 97* 97*  CO2 '31 29 29  '$ GLUCOSE 73 261* 243*  BUN 36* 46* 52*  CREATININE 1.40* 1.81* 1.78*  CALCIUM 9.2 8.9 8.7*  MG  --   --  2.8*    GFR: Estimated Creatinine Clearance: 18.7 mL/min (A) (by C-G formula based on SCr of 1.78 mg/dL (H)).  Liver Function Tests: Recent Labs  Lab 06/09/21 0658  AST 30  ALT 20  ALKPHOS 58  BILITOT 1.1  PROT 7.3  ALBUMIN 4.1    CBG: Recent Labs  Lab 06/10/21 0439 06/10/21 0751 06/10/21 1141 06/10/21 1632 06/10/21 1747  GLUCAP 230* 225* 221* 100* 91    Recent Results (from the past 240 hour(s))  Resp Panel by RT-PCR (Flu A&B, Covid) Nasopharyngeal Swab     Status: None   Collection Time: 06/09/21  6:20 AM   Specimen: Nasopharyngeal Swab; Nasopharyngeal(NP) swabs in vial transport medium  Result Value Ref Range Status   SARS Coronavirus 2 by RT PCR NEGATIVE NEGATIVE Final    Comment: (NOTE) SARS-CoV-2 target nucleic acids are NOT DETECTED.  The SARS-CoV-2 RNA is generally detectable in upper respiratory specimens during the acute phase of infection. The lowest concentration of SARS-CoV-2 viral copies this assay can detect is 138 copies/mL. A negative result does not preclude SARS-Cov-2 infection and  should not be used as the sole basis for treatment or other patient management decisions. A negative result may occur with  improper specimen collection/handling, submission of specimen other than nasopharyngeal swab, presence of viral mutation(s) within the areas targeted by this assay, and inadequate number of viral copies(<138 copies/mL). A negative result must be combined with clinical observations, patient history, and epidemiological information. The expected result is Negative.  Fact Sheet for Patients:  EntrepreneurPulse.com.au  Fact Sheet for Healthcare Providers:  IncredibleEmployment.be  This test is no t yet approved or cleared by the [place] FDA and  has been authorized for detection and/or diagnosis of SARS-CoV-2 by FDA under an Emergency Use Authorization (EUA). This EUA will remain  in effect (meaning this test can be used) for the duration of the COVID-19 declaration under Section 564(b)(1) of the Act, 21 U.S.C.section 360bbb-3(b)(1), unless the authorization is terminated  or revoked sooner.       Influenza A by PCR NEGATIVE NEGATIVE Final   Influenza B by PCR NEGATIVE NEGATIVE Final    Comment: (NOTE) The Xpert Xpress SARS-CoV-2/FLU/RSV plus assay is intended as an aid in the diagnosis of influenza from Nasopharyngeal swab specimens and should not be used as a sole basis for treatment. Nasal washings and aspirates are unacceptable for Xpert Xpress SARS-CoV-2/FLU/RSV testing.  Fact Sheet for Patients: EntrepreneurPulse.com.au  Fact Sheet for Healthcare Providers: IncredibleEmployment.be  This test is not yet approved or cleared by the [place] FDA and has been authorized for detection and/or diagnosis of SARS-CoV-2 by FDA under an Emergency Use Authorization (EUA). This EUA will remain in effect (meaning this test can be used) for the duration of the COVID-19 declaration  under Section 564(b)(1) of the Act, 21 U.S.C. section 360bbb-3(b)(1), unless the authorization is terminated or revoked.  Performed at [facility], [address], [place], [place] [address]      Radiology Studies: [facility] Chest [facility] 1 View  Result Date: 06/10/2021 CLINICAL DATA:  Volume overload. EXAM: PORTABLE CHEST 1 VIEW COMPARISON:  Yesterday FINDINGS: Continued diffuse interstitial opacity with Kerley lines and probable trace pleural fluid. Normal heart size and stable mediastinal contours. No focal opacity. IMPRESSION: Persistent pulmonary edema. Electronically Signed   By: Jorje Guild M.D.   On: 06/10/2021 05:17   DG Chest Port 1 View  Result Date: 06/09/2021 CLINICAL DATA:  Short of breath.  History of cancer. EXAM:  PORTABLE CHEST 1 VIEW COMPARISON:  05/02/2021 FINDINGS: Interval development of diffuse interstitial densities throughout both lungs. Small right effusion noted. Heart size upper normal. Atherosclerotic aortic arch. IMPRESSION: Interval development of diffuse interstitial edema most likely due to fluid overload. Small right effusion. Electronically Signed   By: Franchot Gallo M.D.   On: 06/09/2021 07:58    Scheduled Meds:  albuterol  6 puff Inhalation Once   clopidogrel  75 mg Oral Daily   doxycycline  100 mg Oral Q12H   furosemide  60 mg Intravenous Q12H   guaiFENesin  600 mg Oral BID   heparin  5,000 Units Subcutaneous Q8H   insulin aspart  0-15 Units Subcutaneous TID WC   insulin aspart  0-5 Units Subcutaneous QHS   [START ON 06/11/2021] insulin aspart  6 Units Subcutaneous TID WC   insulin glargine-yfgn  14 Units Subcutaneous Daily   ipratropium-albuterol  3 mL Nebulization TID   methylPREDNISolone (SOLU-MEDROL) injection  60 mg Intravenous Q12H   nitroGLYCERIN  0.2 mg Transdermal Daily   pentoxifylline  400 mg Oral BID WC   senna-docusate  1 tablet Oral Daily   Continuous Infusions:   LOS: 1 day   Time spent: 38 mins  Javiel Canepa Wynetta Emery, MD How to  contact the [facility] Attending or Consulting provider [place] or covering provider during after hours [place], for this patient?  Check the care team in [facility] and look for a) attending/consulting [facility] provider listed and b) the [facility] team listed Log into [email or web address] and use [place]'s universal password to access. If you do not have the password, please contact the hospital operator. Locate the [facility] provider you are looking for under [facility] and page to a number that you can be directly reached. If you still have difficulty reaching the provider, please page the [facility] (Director on Call) for the Hospitalists listed on amion for assistance.  06/10/2021, 6:36 PM

## 2021-06-10 NOTE — Progress Notes (Signed)
Pt arrived to room #305 via stretcher from ED. Pt able to ambulate 4 feet from stretcher to bed with standby assist, tolerated well, no increased SOB noted. Pt on 4 lpm [facility] O2. IVF infusing without s/s infiltration. Tele applied. Pt oriented to room and safety procedures. States understanding.

## 2021-06-11 LAB — CBC WITH DIFFERENTIAL/PLATELET
Abs Immature Granulocytes: 0.08 10*3/uL — ABNORMAL HIGH (ref 0.00–0.07)
Basophils Absolute: 0 10*3/uL (ref 0.0–0.1)
Basophils Relative: 0 %
Eosinophils Absolute: 0 10*3/uL (ref 0.0–0.5)
Eosinophils Relative: 0 %
HCT: 26.4 % — ABNORMAL LOW (ref 36.0–46.0)
Hemoglobin: 8.3 g/dL — ABNORMAL LOW (ref 12.0–15.0)
Immature Granulocytes: 1 %
Lymphocytes Relative: 9 %
Lymphs Abs: 0.6 10*3/uL — ABNORMAL LOW (ref 0.7–4.0)
MCH: 32.5 pg (ref 26.0–34.0)
MCHC: 31.4 g/dL (ref 30.0–36.0)
MCV: 103.5 fL — ABNORMAL HIGH (ref 80.0–100.0)
Monocytes Absolute: 0.2 10*3/uL (ref 0.1–1.0)
Monocytes Relative: 3 %
Neutro Abs: 6.1 10*3/uL (ref 1.7–7.7)
Neutrophils Relative %: 87 %
Platelets: 381 10*3/uL (ref 150–400)
RBC: 2.55 MIL/uL — ABNORMAL LOW (ref 3.87–5.11)
RDW: 23.4 % — ABNORMAL HIGH (ref 11.5–15.5)
WBC: 7 10*3/uL (ref 4.0–10.5)
nRBC: 0.6 % — ABNORMAL HIGH (ref 0.0–0.2)

## 2021-06-11 LAB — BASIC METABOLIC PANEL
Anion gap: 9 (ref 5–15)
BUN: 59 mg/dL — ABNORMAL HIGH (ref 8–23)
CO2: 28 mmol/L (ref 22–32)
Calcium: 8.6 mg/dL — ABNORMAL LOW (ref 8.9–10.3)
Chloride: 100 mmol/L (ref 98–111)
Creatinine, Ser: 1.67 mg/dL — ABNORMAL HIGH (ref 0.44–1.00)
GFR, Estimated: 31 mL/min — ABNORMAL LOW (ref >60)
Glucose, Bld: 241 mg/dL — ABNORMAL HIGH (ref 70–99)
Potassium: 4.1 mmol/L (ref 3.5–5.1)
Sodium: 137 mmol/L (ref 135–145)

## 2021-06-11 LAB — GLUCOSE, CAPILLARY
Glucose-Capillary: 194 mg/dL — ABNORMAL HIGH (ref 70–99)
Glucose-Capillary: 224 mg/dL — ABNORMAL HIGH (ref 70–99)
Glucose-Capillary: 275 mg/dL — ABNORMAL HIGH (ref 70–99)
Glucose-Capillary: 295 mg/dL — ABNORMAL HIGH (ref 70–99)
Glucose-Capillary: 112 mg/dL — ABNORMAL HIGH (ref 70–99)

## 2021-06-11 LAB — BRAIN NATRIURETIC PEPTIDE: B Natriuretic Peptide: 623 pg/mL — ABNORMAL HIGH (ref 0.0–100.0)

## 2021-06-11 LAB — MAGNESIUM: Magnesium: 2.4 mg/dL (ref 1.7–2.4)

## 2021-06-11 MED ORDER — METHYLPREDNISOLONE SODIUM SUCC 125 MG IJ SOLR
60.0000 mg | Freq: Every day | INTRAMUSCULAR | Status: DC
  Administered 2021-06-12 – 2021-06-13 (×2): 60 mg via INTRAVENOUS
  Filled 2021-06-11 (×2): qty 2

## 2021-06-11 NOTE — Progress Notes (Signed)
PROGRESS NOTE   Hannah French  [id number] DOB: 1940-10-02 DOA: 06/09/2021 PCP: Dettinger, Fransisca Kaufmann, MD   Chief Complaint  Patient presents with   Respiratory Distress   Level of care: Telemetry  Brief Admission History:  80 y.o. 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  She is DNR.  She has been having difficulty with increasing dyspnea over the past weeks.  She has had increasing weight gain and reported to her daughter that she is afraid to sleep alone at night.  Daughter has been staying with her for the past several days and reports recently changed to torsemide due to difficulty diuresing with Lasix.  She continues to have shortness of breath that acutely became worse in the early morning hours prompting daughter to call EMS.  They gave her a dose of Solu-Medrol 125 mg and started her on 15 L nonrebreather due to respiratory distress.   Assessment & Plan:   Principal Problem:   Acute and chronic respiratory failure with hypoxia (HCC) Active Problems:   Hyperlipidemia associated with type 2 diabetes mellitus (HCC)   Mitral valve disease   PVD   Carotid stenosis   Type 2 diabetes mellitus (HCC)   Tobacco abuse   Stage 3 severe COPD by GOLD classification (IXL)   Coronary artery disease involving native coronary artery of native heart with angina pectoris (HCC)   Hyperlipidemia   Chronic combined systolic and diastolic CHF (congestive heart failure) (HCC)   Vitamin D deficiency   Cigarette smoker   Acute respiratory failure with hypoxia (HCC)   Leukocytosis   CKD (chronic kidney  disease), stage III (HCC)   Hyperglycemia due to diabetes mellitus ([place])   Chronic respiratory failure with hypoxia and hypercapnia ([place])   DNR (do not resuscitate)   Hospice care patient   Anemia   Thrombocytosis   Acute on chronic respiratory failure with hypoxia-likely this is a combination of her advanced lung disease COPD exacerbation superimposed with in a systolic heart failure exacerbation.  She has been treated with IV Lasix for diuresis continue BiPAP therapy wean as able and would not escalate therapy given DNR and hospice status.  I discussed this with her daughter at the bedside.  Primary goal of care should be focusing on comfort.  She is off bipap this morning and some improvement seen. Continue treatments.   Pt is now off bipap therapy and back on nasal cannula, she is breathing better, Reduce solumedrol to once daily Continue bronchodilator therapy.    Acute systolic heart failure exacerbation-patient had recently been started on torsemide however symptoms persisted and now in acute exacerbation.  She has been started on IV Lasix for diuresis and will follow closely monitoring intake and output and electrolytes closely.  Did not repeat 2D echo as she had it done 2 months ago with an EF reported as 25-30%.  Increase IV lasix to 60 mg BID.   Intake / Output and weights not measured accurately unfortunately Continue IV lasix 60 mg BID Clinically improving     Type 2 diabetes mellitus with complications-patient having some steroid-induced hyperglycemia which we will treat as needed with supplemental sliding scale coverage and CBG monitoring.  CBG (last 3)  Recent Labs    06/11/21 0716 06/11/21 1115 06/11/21 1602  GLUCAP 224* 275* 295*   Reduce IV steroids by 50% today.  Continue close CBG monitoring.     Anemia in chronic kidney disease-continue to monitor CBC daily and transfuse as needed for comfort.  No signs of active bleeding at this time.  STable     Leukocytosis-likely secondary to steroid use monitor CBC with differential. RESOLVED   Coronary artery disease-no signs of active chest pain symptoms at this time.  EKG with no acute findings.  Follow clinically.  Hyperlipidemia-resume home medications when fully reconciled.  Peripheral vascular disease-currently stable.   Tobacco-discussed with daughter at bedside was strongly advised patient to stop all tobacco use but given that she is now on hospice they may decide to allow her to continue smoking regardless of the consequences.   offer a nicotine patch.  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  RESOLVED  DNR-confirmed with daughter at bedside continue DNR order in hospital with no plans to escalate.  I did ask for a palliative medicine consultation.     DVT prophylaxis: SQ heparin Code Status: DNR Family Communication:  Disposition: anticipate home with hospice in 1-2 days Status is: Inpatient  Remains inpatient appropriate because:IV treatments appropriate due to intensity of illness or inability to take PO and Inpatient level of care appropriate due to severity of illness  Dispo: The patient is from: Home              Anticipated d/c is to: Home              Patient currently is not medically stable to d/c.   Difficult to place patient No   Consultants:  N/a  Procedures:  N/a  Antimicrobials:  N/a   Subjective: Pt is up and ambulating in the room.  She says she is NOT back to her baseline.    Objective: Vitals:   06/11/21 0640 06/11/21 0904 06/11/21 1405 06/11/21 1459  BP:   (!) 118/55   Pulse:   87   Resp:      Temp:   97.6 F (36.4 C)   TempSrc:   Oral   SpO2:  98% 100% 100%  Weight: 56.8 kg     Height: '4\' 11"'$  (1.499 m)       Intake/Output Summary (Last 24 hours) at 06/11/2021 1609 Last data filed at 06/11/2021 1300 Gross per 24 hour  Intake 1231.12 ml  Output --  Net 1231.12 ml   Filed Weights   06/09/21 0624  06/11/21 0640  Weight: 53 kg 56.8 kg    Examination:  General exam: frail elderly female, awake, alert, Appears calm and comfortable on nasal cannula.  Respiratory system: poor air movement, crackles seem to be resolved today. Cardiovascular system: normal S1 & S2 heard. No JVD, murmurs, rubs, gallops or clicks. No pedal edema. Gastrointestinal system: Abdomen is nondistended, soft and nontender. No organomegaly or masses felt. Normal bowel sounds heard. Central nervous system: Alert and oriented. No focal neurological deficits. Extremities: Symmetric 5 x 5 power. Skin: No rashes, lesions or ulcers Psychiatry: Judgement and insight appear normal. Mood & affect appropriate.   Data Reviewed: I have personally reviewed following labs and imaging studies  CBC: Recent Labs  Lab 06/09/21 0658 06/10/21 0434 06/11/21 0442  WBC 16.0* 7.8 7.0  NEUTROABS 12.9* 6.7 6.1  HGB 10.1* 8.7* 8.3*  HCT 32.3* 27.3* 26.4*  MCV 103.5* 103.0* 103.5*  PLT 549* 396 [id number]    Basic Metabolic Panel: Recent Labs  Lab 06/06/21 1234 06/09/21 0658 06/10/21 0434 06/11/21 0442  NA 140 136 136 137  K 3.6 3.7 4.2 4.1  CL 95* 97* 97* 100  CO2 '31 29 29 28  '$ GLUCOSE 73 261* 243* 241*  BUN 36* 46* 52* 59*  CREATININE 1.40* 1.81* 1.78* 1.67*  CALCIUM 9.2 8.9 8.7* 8.6*  MG  --   --  2.8* 2.4    GFR: Estimated Creatinine Clearance: 20.6 mL/min (A) (by C-G formula based on SCr of 1.67 mg/dL (H)).  Liver Function Tests: Recent Labs  Lab 06/09/21 0658  AST 30  ALT 20  ALKPHOS 58  BILITOT 1.1  PROT 7.3  ALBUMIN 4.1    CBG: Recent Labs  Lab 06/10/21 2125 06/11/21 0314 06/11/21 0716 06/11/21 1115 06/11/21 1602  GLUCAP 112* 194* 224* 275* 295*    Recent Results (from the past 240 hour(s))  Resp Panel by RT-PCR (Flu A&B, Covid) Nasopharyngeal Swab     Status: None   Collection Time: 06/09/21  6:20 AM   Specimen: Nasopharyngeal Swab; Nasopharyngeal(NP) swabs in vial transport medium  Result  Value Ref Range Status   SARS Coronavirus 2 by RT PCR NEGATIVE NEGATIVE Final    Comment: (NOTE) SARS-CoV-2 target nucleic acids are NOT DETECTED.  The SARS-CoV-2 RNA is generally detectable in upper respiratory specimens during the acute phase of infection. The lowest concentration of SARS-CoV-2 viral copies this assay can detect is 138 copies/mL. A negative result does not preclude SARS-Cov-2 infection and should not be used as the sole basis for treatment or other patient management decisions. A negative result may occur with  improper specimen collection/handling, submission of specimen other than nasopharyngeal swab, presence of viral mutation(s) within the areas targeted by this assay, and inadequate number of viral copies(<138 copies/mL). A negative result must be combined with clinical observations, patient history, and epidemiological information. The expected result is Negative.  Fact Sheet for Patients:  EntrepreneurPulse.com.au  Fact Sheet for Healthcare Providers:  IncredibleEmployment.be  This test is no t yet approved or cleared by the [place] FDA and  has been authorized for detection and/or diagnosis of SARS-CoV-2 by FDA under an Emergency Use Authorization (EUA). This EUA will remain  in effect (meaning this test can be used) for the duration of the COVID-19 declaration under Section 564(b)(1) of the Act, 21 U.S.C.section 360bbb-3(b)(1), unless the authorization is terminated  or revoked sooner.       Influenza A by PCR NEGATIVE NEGATIVE Final   Influenza B by PCR NEGATIVE NEGATIVE Final    Comment: (NOTE) The Xpert Xpress SARS-CoV-2/FLU/RSV plus assay is intended as an aid in the diagnosis of influenza from Nasopharyngeal swab specimens and should not be used as a sole basis for treatment. Nasal washings and aspirates are unacceptable for Xpert Xpress SARS-CoV-2/FLU/RSV testing.  Fact Sheet for  Patients: EntrepreneurPulse.com.au  Fact Sheet for Healthcare Providers: IncredibleEmployment.be  This test is not yet approved or cleared by the [place] FDA and has been authorized for detection and/or diagnosis of SARS-CoV-2 by FDA under an Emergency Use Authorization (EUA). This EUA will remain in effect (meaning this test can be used) for the duration of the COVID-19 declaration under Section 564(b)(1) of the Act, 21 U.S.C. section 360bbb-3(b)(1), unless the authorization is terminated or revoked.  Performed at [facility], [address], [place], [place] [address]      Radiology Studies: [facility] [address]  Result Date: 06/10/2021 CLINICAL DATA:  Volume overload. EXAM: PORTABLE CHEST 1 VIEW COMPARISON:  Yesterday FINDINGS: Continued diffuse interstitial opacity with Kerley lines and probable trace pleural fluid. Normal heart size and stable mediastinal contours. No focal opacity. IMPRESSION: Persistent pulmonary edema. Electronically Signed   By: Jorje Guild M.D.   On: 06/10/2021 05:17    Scheduled Meds:  albuterol  6 puff Inhalation Once   clopidogrel  75 mg Oral Daily   doxycycline  100 mg Oral Q12H   furosemide  60 mg Intravenous Q12H   guaiFENesin  600 mg Oral BID   heparin  5,000 Units Subcutaneous Q8H   insulin aspart  0-15 Units Subcutaneous TID WC   insulin aspart  0-5 Units Subcutaneous QHS   insulin aspart  6 Units Subcutaneous TID WC   insulin glargine-yfgn  14 Units Subcutaneous Daily   ipratropium-albuterol  3 mL Nebulization TID   methylPREDNISolone (SOLU-MEDROL) injection  60 mg Intravenous Q12H   nitroGLYCERIN  0.2 mg Transdermal Daily   pentoxifylline  400 mg Oral BID WC   senna-docusate  1 tablet Oral Daily   Continuous Infusions:   LOS: 2 days   Time spent: 35 mins  Latroya Ng Wynetta Emery, MD How to contact the [facility] Attending or Consulting provider [place] or covering provider during after hours [place],  for this patient?  Check the care team in [facility] and look for a) attending/consulting [facility] provider listed and b) the [facility] team listed Log into [email or web address] and use [place]'s universal password to access. If you do not have the password, please contact the hospital operator. Locate the [facility] provider you are looking for under [facility] and page to a number that you can be directly reached. If you still have difficulty reaching the provider, please page the [facility] (Director on Call) for the Hospitalists listed on amion for assistance.  06/11/2021, 4:09 PM

## 2021-06-12 LAB — GLUCOSE, CAPILLARY
Glucose-Capillary: 136 mg/dL — ABNORMAL HIGH (ref 70–99)
Glucose-Capillary: 57 mg/dL — ABNORMAL LOW (ref 70–99)
Glucose-Capillary: 75 mg/dL (ref 70–99)
Glucose-Capillary: 234 mg/dL — ABNORMAL HIGH (ref 70–99)
Glucose-Capillary: 369 mg/dL — ABNORMAL HIGH (ref 70–99)
Glucose-Capillary: 304 mg/dL — ABNORMAL HIGH (ref 70–99)

## 2021-06-12 LAB — BASIC METABOLIC PANEL
Anion gap: 8 (ref 5–15)
BUN: 64 mg/dL — ABNORMAL HIGH (ref 8–23)
CO2: 31 mmol/L (ref 22–32)
Calcium: 8.9 mg/dL (ref 8.9–10.3)
Chloride: 100 mmol/L (ref 98–111)
Creatinine, Ser: 1.53 mg/dL — ABNORMAL HIGH (ref 0.44–1.00)
GFR, Estimated: 34 mL/min — ABNORMAL LOW (ref >60)
Glucose, Bld: 114 mg/dL — ABNORMAL HIGH (ref 70–99)
Potassium: 3.7 mmol/L (ref 3.5–5.1)
Sodium: 139 mmol/L (ref 135–145)

## 2021-06-12 LAB — MAGNESIUM: Magnesium: 2.2 mg/dL (ref 1.7–2.4)

## 2021-06-12 LAB — BRAIN NATRIURETIC PEPTIDE: B Natriuretic Peptide: 581 pg/mL — ABNORMAL HIGH (ref 0.0–100.0)

## 2021-06-12 MED ORDER — PENTOXIFYLLINE ER 400 MG PO TBCR: 400.0000 mg | EXTENDED_RELEASE_TABLET | Freq: Two times a day (BID) | ORAL | 3 refills | Status: AC

## 2021-06-12 MED ORDER — INSULIN GLARGINE-YFGN 100 UNIT/ML ~~LOC~~ SOLN
9.0000 [IU] | Freq: Every day | SUBCUTANEOUS | Status: DC
  Administered 2021-06-13: 9 [IU] via SUBCUTANEOUS
  Filled 2021-06-12 (×2): qty 0.09

## 2021-06-12 MED ORDER — INSULIN GLARGINE-YFGN 100 UNIT/ML ~~LOC~~ SOLN
12.0000 [IU] | Freq: Every day | SUBCUTANEOUS | Status: DC
  Administered 2021-06-12: 12 [IU] via SUBCUTANEOUS
  Filled 2021-06-12 (×3): qty 0.12

## 2021-06-12 MED ORDER — INSULIN ASPART 100 UNIT/ML IJ SOLN
2.0000 [IU] | Freq: Three times a day (TID) | INTRAMUSCULAR | Status: DC
  Administered 2021-06-12 – 2021-06-13 (×3): 2 [IU] via SUBCUTANEOUS

## 2021-06-12 MED ORDER — PREDNISONE 20 MG PO TABS: ORAL_TABLET | ORAL | 0 refills | Status: DC

## 2021-06-12 MED ORDER — INSULIN ASPART 100 UNIT/ML IJ SOLN: 4.0000 [IU] | Freq: Three times a day (TID) | INTRAMUSCULAR | Status: DC

## 2021-06-12 MED ORDER — DOXYCYCLINE HYCLATE 100 MG PO TABS: 100.0000 mg | ORAL_TABLET | Freq: Two times a day (BID) | ORAL | 0 refills | Status: AC

## 2021-06-12 NOTE — Discharge Instructions (Addendum)
IMPORTANT INFORMATION: PAY CLOSE ATTENTION   PHYSICIAN DISCHARGE INSTRUCTIONS  Follow with Primary care provider  Dettinger, Joshua A, MD  and other consultants as instructed by your Hospitalist Physician  SEEK MEDICAL CARE OR RETURN TO EMERGENCY ROOM IF SYMPTOMS COME BACK, WORSEN OR NEW PROBLEM DEVELOPS   Please note: You were cared for by a hospitalist during your hospital stay. Every effort will be made to forward records to your primary care provider.  You can request that your primary care provider send for your hospital records if they have not received them.  Once you are discharged, your primary care physician will handle any further medical issues. Please note that NO REFILLS for any discharge medications will be authorized once you are discharged, as it is imperative that you return to your primary care physician (or establish a relationship with a primary care physician if you do not have one) for your post hospital discharge needs so that they can reassess your need for medications and monitor your lab values.  Please get a complete blood count and chemistry panel checked by your Primary MD at your next visit, and again as instructed by your Primary MD.  Get Medicines reviewed and adjusted: Please take all your medications with you for your next visit with your Primary MD  Laboratory/radiological data: Please request your Primary MD to go over all hospital tests and procedure/radiological results at the follow up, please ask your primary care provider to get all Hospital records sent to his/her office.  In some cases, they will be blood work, cultures and biopsy results pending at the time of your discharge. Please request that your primary care provider follow up on these results.  If you are diabetic, please bring your blood sugar readings with you to your follow up appointment with primary care.    Please call and make your follow up appointments as soon as possible.    Also  Note the following: If you experience worsening of your admission symptoms, develop shortness of breath, life threatening emergency, suicidal or homicidal thoughts you must seek medical attention immediately by calling 911 or calling your MD immediately  if symptoms less severe.  You must read complete instructions/literature along with all the possible adverse reactions/side effects for all the Medicines you take and that have been prescribed to you. Take any new Medicines after you have completely understood and accpet all the possible adverse reactions/side effects.   Do not drive when taking Pain medications or sleeping medications (Benzodiazepines)  Do not take more than prescribed Pain, Sleep and Anxiety Medications. It is not advisable to combine anxiety,sleep and pain medications without talking with your primary care practitioner  Special Instructions: If you have smoked or chewed Tobacco  in the last 2 yrs please stop smoking, stop any regular Alcohol  and or any Recreational drug use.  Wear Seat belts while driving.  Do not drive if taking any narcotic, mind altering or controlled substances or recreational drugs or alcohol.        

## 2021-06-12 NOTE — Progress Notes (Signed)
PROGRESS NOTE   Hannah French  [id number] DOB: 08-30-1941 DOA: 06/09/2021 PCP: Dettinger, Fransisca Kaufmann, MD   Chief Complaint  Patient presents with   Respiratory Distress   Level of care: Med-Surg  Brief Admission History:  80 y.o. 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  She is DNR.  She has been having difficulty with increasing dyspnea over the past weeks.  She has had increasing weight gain and reported to her daughter that she is afraid to sleep alone at night.  Daughter has been staying with her for the past several days and reports recently changed to torsemide due to difficulty diuresing with Lasix.  She continues to have shortness of breath that acutely became worse in the early morning hours prompting daughter to call EMS.  They gave her a dose of Solu-Medrol 125 mg and started her on 15 L nonrebreather due to respiratory distress.   Assessment & Plan:   Principal Problem:   Acute and chronic respiratory failure with hypoxia (HCC) Active Problems:   Hyperlipidemia associated with type 2 diabetes mellitus (HCC)   Mitral valve disease   PVD   Carotid stenosis   Type 2 diabetes mellitus (HCC)   Tobacco abuse   Stage 3 severe COPD by GOLD classification ([place])   Coronary artery disease involving native coronary artery of native heart with angina pectoris (HCC)   Hyperlipidemia   Chronic combined systolic and diastolic CHF (congestive heart failure) (HCC)   Vitamin D deficiency   Cigarette smoker   Acute respiratory failure with hypoxia (HCC)   Leukocytosis   CKD (chronic kidney  disease), stage III (HCC)   Hyperglycemia due to diabetes mellitus ([place])   Chronic respiratory failure with hypoxia and hypercapnia ([place])   DNR (do not resuscitate)   Hospice care patient   Anemia   Thrombocytosis  Acute on chronic respiratory failure with hypoxia-likely this is a combination of her advanced lung disease COPD exacerbation superimposed with in a systolic heart failure exacerbation.  She has been treated with IV Lasix for diuresis continue BiPAP therapy wean as able and would not escalate therapy given DNR and hospice status.  I discussed this with her daughter and son.  Primary goal of care should be focusing on comfort.  She is off bipap this morning and some improvement seen. Continue treatments.   Pt is now off bipap therapy and back on nasal cannula, she is down to 4L/min.  Reduce solumedrol to once daily.  Continue bronchodilator therapy.  Family unsure about code status, awaiting palliative care consultation 9/19 to discuss further    Acute systolic heart failure exacerbation-patient had recently been started on torsemide however symptoms persisted and now in acute exacerbation.  She has been started on IV Lasix for diuresis and will follow closely monitoring intake and output and electrolytes closely.  Did not repeat 2D echo as she had it done 2 months ago with an EF reported as 25-30%.  Increased IV lasix to 60 mg BID.   Intake / Output and weights not measured accurately unfortunately Continue IV lasix 60 mg BID Clinically improving but slow   Type 2 diabetes mellitus with complications-patient having some steroid-induced hyperglycemia  which we will treat as needed with supplemental sliding scale coverage and CBG monitoring.   CBG (last 3)  Recent Labs    06/12/21 0738 06/12/21 0757 06/12/21 1100  GLUCAP 57* 75 234*   Continue IV steroids today.  Continue CBG monitoring.     Anemia in chronic kidney disease-continue to monitor CBC daily and transfuse as needed  for comfort.  No signs of active bleeding at this time.  Stable    Leukocytosis-likely secondary to steroid use monitor CBC with differential. RESOLVED   Coronary artery disease-no signs of active chest pain symptoms at this time.  EKG with no acute findings.  Follow clinically.  Hyperlipidemia-resume home medications when fully reconciled.  Peripheral vascular disease-currently stable.   Tobacco-discussed with daughter at bedside was strongly advised patient to stop all tobacco use but given that she is now on hospice they may decide to allow her to continue smoking regardless of the consequences.   offer a nicotine patch.  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  RESOLVED  DNR-confirmed with daughter at bedside continue DNR order in hospital with no plans to escalate.  I did ask for a palliative medicine consultation.     DVT prophylaxis: SQ heparin Code Status: DNR Family Communication: son 9/18 Disposition: anticipate home with hospice in 1-2 days Status is: Inpatient  Remains inpatient appropriate because:IV treatments appropriate due to intensity of illness or inability to take PO and Inpatient level of care appropriate due to severity of illness  Dispo: The patient is from: Home              Anticipated d/c is to: Home              Patient currently is not medically stable to d/c.   Difficult to place patient No   Consultants:  N/a  Procedures:  N/a  Antimicrobials:  N/a   Subjective: Pt is up and ambulating in the room.  She says she is NOT back to her baseline.    Objective: Vitals:   06/11/21 2118 06/12/21 0616 06/12/21 0630 06/12/21 0802  BP: (!) 110/50 130/80    Pulse: 89 95    Resp: 19 19    Temp: 98 F (36.7 C) 98 F (36.7 C)    TempSrc: Oral Oral    SpO2: 99% 100%  96%  Weight:   56.2 kg   Height:   '4\' 11"'$  (1.499 m)     Intake/Output Summary (Last 24 hours) at 06/12/2021 1146 Last data filed at 06/12/2021  0900 Gross per 24 hour  Intake 480 ml  Output --  Net 480 ml   Filed Weights   06/09/21 0624 06/11/21 0640 06/12/21 0630  Weight: 53 kg 56.8 kg 56.2 kg    Examination:  General exam: frail elderly female, awake, alert, Appears calm and comfortable on nasal cannula.  Respiratory system: poor air movement, crackles seem to be resolved today. Cardiovascular system: normal S1 & S2 heard. No JVD, murmurs, rubs, gallops or clicks. No pedal edema. Gastrointestinal system: Abdomen is nondistended, soft and nontender. No organomegaly or masses felt. Normal bowel sounds heard. Central nervous system: Alert and oriented. No focal neurological deficits. Extremities: Symmetric 5 x 5 power. Skin: No rashes, lesions or ulcers Psychiatry: Judgement and insight appear normal. Mood & affect appropriate.   Data Reviewed: I have personally reviewed following labs and imaging studies  CBC: Recent Labs  Lab 06/09/21 0658 06/10/21 0434 06/11/21 0442  WBC 16.0* 7.8 7.0  NEUTROABS 12.9* 6.7 6.1  HGB 10.1* 8.7* 8.3*  HCT 32.3* 27.3* 26.4*  MCV 103.5* 103.0* 103.5*  PLT 549* 396 [id number]    Basic Metabolic Panel: Recent Labs  Lab 06/06/21 1234 06/09/21 0658 06/10/21 0434 06/11/21 0442 06/12/21 0433  NA 140 136 136 137 139  K 3.6 3.7 4.2 4.1 3.7  CL 95* 97* 97* 100 100  CO2 '31 29 29 28 31  '$ GLUCOSE 73 261* 243* 241* 114*  BUN 36* 46* 52* 59* 64*  CREATININE 1.40* 1.81* 1.78* 1.67* 1.53*  CALCIUM 9.2 8.9 8.7* 8.6* 8.9  MG  --   --  2.8* 2.4 2.2    GFR: Estimated Creatinine Clearance: 22.4 mL/min (A) (by C-G formula based on SCr of 1.53 mg/dL (H)).  Liver Function Tests: Recent Labs  Lab 06/09/21 0658  AST 30  ALT 20  ALKPHOS 58  BILITOT 1.1  PROT 7.3  ALBUMIN 4.1    CBG: Recent Labs  Lab 06/11/21 2119 06/12/21 0326 06/12/21 0738 06/12/21 0757 06/12/21 1100  GLUCAP 112* 136* 57* 75 234*    Recent Results (from the past 240 hour(s))  Resp Panel by RT-PCR (Flu A&B,  Covid) Nasopharyngeal Swab     Status: None   Collection Time: 06/09/21  6:20 AM   Specimen: Nasopharyngeal Swab; Nasopharyngeal(NP) swabs in vial transport medium  Result Value Ref Range Status   SARS Coronavirus 2 by RT PCR NEGATIVE NEGATIVE Final    Comment: (NOTE) SARS-CoV-2 target nucleic acids are NOT DETECTED.  The SARS-CoV-2 RNA is generally detectable in upper respiratory specimens during the acute phase of infection. The lowest concentration of SARS-CoV-2 viral copies this assay can detect is 138 copies/mL. A negative result does not preclude SARS-Cov-2 infection and should not be used as the sole basis for treatment or other patient management decisions. A negative result may occur with  improper specimen collection/handling, submission of specimen other than nasopharyngeal swab, presence of viral mutation(s) within the areas targeted by this assay, and inadequate number of viral copies(<138 copies/mL). A negative result must be combined with clinical observations, patient history, and epidemiological information. The expected result is Negative.  Fact Sheet for Patients:  EntrepreneurPulse.com.au  Fact Sheet for Healthcare Providers:  IncredibleEmployment.be  This test is no t yet approved or cleared by the [place] FDA and  has been authorized for detection and/or diagnosis of SARS-CoV-2 by FDA under an Emergency Use Authorization (EUA). This EUA will remain  in effect (meaning this test can be used) for the duration of the COVID-19 declaration under Section 564(b)(1) of the Act, 21 U.S.C.section 360bbb-3(b)(1), unless the authorization is terminated  or revoked sooner.       Influenza A by PCR NEGATIVE NEGATIVE Final   Influenza B by PCR NEGATIVE NEGATIVE Final    Comment: (NOTE) The Xpert Xpress SARS-CoV-2/FLU/RSV plus assay is intended as an aid in the diagnosis of influenza from Nasopharyngeal swab specimens and should  not be used as a sole basis for treatment. Nasal washings and aspirates are unacceptable for Xpert Xpress SARS-CoV-2/FLU/RSV testing.  Fact Sheet for Patients: EntrepreneurPulse.com.au  Fact Sheet for Healthcare Providers: IncredibleEmployment.be  This test is not yet approved or cleared by the [place] FDA and has been authorized for detection and/or diagnosis of SARS-CoV-2 by FDA under an Emergency Use Authorization (EUA). This EUA will remain in effect (meaning this test can be used) for the duration of the COVID-19 declaration under Section 564(b)(1) of the Act, 21 U.S.C. section 360bbb-3(b)(1), unless  the authorization is terminated or revoked.  Performed at [facility], [address] [place], [place] [address]      Radiology Studies: No results found.  Scheduled Meds:  albuterol  6 puff Inhalation Once   clopidogrel  75 mg Oral Daily   doxycycline  100 mg Oral Q12H   furosemide  60 mg Intravenous Q12H   guaiFENesin  600 mg Oral BID   heparin  5,000 Units Subcutaneous Q8H   insulin aspart  0-15 Units Subcutaneous TID WC   insulin aspart  0-5 Units Subcutaneous QHS   insulin aspart  2 Units Subcutaneous TID WC   [START ON 06/13/2021] insulin glargine-yfgn  9 Units Subcutaneous Daily   ipratropium-albuterol  3 mL Nebulization TID   methylPREDNISolone (SOLU-MEDROL) injection  60 mg Intravenous Daily   nitroGLYCERIN  0.2 mg Transdermal Daily   pentoxifylline  400 mg Oral BID WC   senna-docusate  1 tablet Oral Daily   Continuous Infusions:   LOS: 3 days   Time spent: 35 mins  Phelan Schadt Wynetta Emery, MD How to contact the [facility] Attending or Consulting provider [place] or covering provider during after hours [place], for this patient?  Check the care team in [facility] and look for a) attending/consulting TRH provider listed and b) the [facility] team listed Log into [email or web address] and use [place]'s universal password to access. If you do not have  the password, please contact the hospital operator. Locate the [facility] provider you are looking for under [facility] and page to a number that you can be directly reached. If you still have difficulty reaching the provider, please page the [facility] (Director on Call) for the Hospitalists listed on amion for assistance.  06/12/2021, 11:46 AM

## 2021-06-12 NOTE — Evaluation (Signed)
Physical Therapy Evaluation Patient Details Name: Hannah French MRN: [id number] DOB: [birth year] Today's Date: 06/12/2021  History of Present Illness  Hannah French is a 80 y.o. 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  She is DNR.  She has been having difficulty with increasing dyspnea over the past weeks.  She has had increasing weight gain and reported to her daughter that she is afraid to sleep alone at night.  Daughter has been staying with her for the past several days and reports recently changed to torsemide due to difficulty diuresing with Lasix.  She continues to have shortness of breath that acutely became worse in the early morning hours prompting daughter to call EMS.  They gave her a dose of Solu-Medrol 125 mg and started her on 15 L nonrebreather due to respiratory distress.   Clinical Impression  Patient functioning near baseline for functional mobility and gait demonstrating good return for ambulating in room and transferring to/from commode in bathroom without loss of balance using SPC and limited mostly due to fatigue.  Patient on 2 LPM O2 during functional activity and walking with SpO2 at 94% and tolerated sitting up in chair after therapy - nursing staff notified.  Patient will benefit from continued physical therapy in hospital and recommended venue below to increase strength, balance, endurance for safe ADLs and gait.         Recommendations for follow up therapy are one component of a multi-disciplinary discharge planning process, led by the attending physician.   Recommendations may be updated based on patient status, additional functional criteria and insurance authorization.  Follow Up Recommendations No PT follow up;Supervision for mobility/OOB;Supervision - Intermittent    Equipment Recommendations  None recommended by PT    Recommendations for Other Services       Precautions / Restrictions Precautions Precautions: Fall Restrictions Weight Bearing Restrictions: No      Mobility  Bed Mobility Overal bed mobility: Modified Independent                  Transfers Overall transfer level: Modified independent                  Ambulation/Gait Ambulation/Gait assistance: Supervision;Min guard Gait Distance (Feet): 15 Feet Assistive device: Straight cane Gait Pattern/deviations: Decreased step length - right;Decreased step length - left;Decreased stride length Gait velocity: decreased   General Gait Details: slightly labored cadence without loss of balance, limited mostly due to fatigue, on 2 LPM O2 with SpO2 at 94%  Stairs            Wheelchair Mobility    Modified Rankin (Stroke Patients Only)       Balance Overall balance assessment: Needs assistance Sitting-balance support: Feet supported;No upper extremity supported Sitting balance-Leahy Scale: Good Sitting balance - Comments: seated at EOB   Standing balance support: During functional activity;Single extremity supported Standing balance-Leahy Scale: Fair Standing balance comment: using SPC                             Pertinent Vitals/Pain Pain Assessment: No/denies pain    Home Living Family/patient expects to be discharged  to:: Private residence Living Arrangements: Children Available Help at Discharge: Family;Available PRN/intermittently Type of Home: House Home Access: Level entry     Home Layout: One level Home Equipment: Walker - 2 wheels;Wheelchair - manual;Cane - single point;Bedside commode;Shower seat;Grab bars -  tub/shower      Prior Function Level of Independence: Needs assistance   Gait / Transfers Assistance Needed: household ambulator using [place]  ADL's / Homemaking Assistance Needed: assisted by family        Hand Dominance   Dominant Hand: Right    Extremity/Trunk Assessment   Upper Extremity Assessment Upper Extremity Assessment: Overall WFL for tasks assessed    Lower Extremity Assessment Lower Extremity Assessment: Generalized weakness    Cervical / Trunk Assessment Cervical / Trunk Assessment: Normal  Communication   Communication: HOH  Cognition Arousal/Alertness: Awake/alert Behavior During Therapy: WFL for tasks assessed/performed Overall Cognitive Status: Within Functional Limits for tasks assessed                                        General Comments      Exercises     Assessment/Plan    PT Assessment Patient needs continued PT services  PT Problem List Decreased strength;Decreased activity tolerance;Decreased mobility;Decreased balance       PT Treatment Interventions DME instruction;Gait training;Stair training;Functional mobility training;Therapeutic activities;Therapeutic exercise;Patient/family education;Balance training    PT Goals (Current goals can be found in the Care Plan section)  Acute Rehab PT Goals Patient Stated Goal: return home with family to assist PT Goal Formulation: With patient Time For Goal Achievement: 06/17/21 Potential to Achieve Goals: Good    Frequency Min 2X/week   Barriers to discharge        Co-evaluation               AM-PAC PT "6 Clicks" Mobility  Outcome Measure Help needed turning from your back to your side while in a flat bed without using bedrails?: None Help needed moving from lying on your back to sitting on the side of a flat bed without using bedrails?: None Help needed moving to and from a bed to a chair (including a wheelchair)?: None Help needed standing up from a chair  using your arms (e.g., wheelchair or bedside chair)?: None Help needed to walk in hospital room?: A Little Help needed climbing 3-5 steps with a railing? : A Lot 6 Click Score: 21    End of Session Equipment Utilized During Treatment: Oxygen Activity Tolerance: Patient tolerated treatment well;Patient limited by fatigue Patient left: in chair;with call bell/phone within reach Nurse Communication: Mobility status PT Visit Diagnosis: Unsteadiness on feet (R26.81);Other abnormalities of gait and mobility (R26.89);Muscle weakness (generalized) (M62.81)    Time: [id number] PT Time Calculation (min) (ACUTE ONLY): 23 min   Charges:   PT Evaluation $PT Eval Moderate Complexity: 1 Mod PT Treatments $Therapeutic Activity: 23-37 mins        1:03 PM, 06/12/21 [name], MPT Physical Therapist with [facility] [phone] office [phone] mobile phone

## 2021-06-12 NOTE — Plan of Care (Signed)
  Problem: Acute Rehab PT Goals(only PT should resolve) Goal: Pt will Roll Supine to Side Outcome: Progressing Flowsheets (Taken 06/12/2021 1304) Pt will Roll Supine to Side: Independently Goal: Patient Will Transfer Sit To/From Stand Outcome: Progressing Flowsheets (Taken 06/12/2021 1304) Patient will transfer sit to/from stand: with modified independence Goal: Pt Will Transfer Bed To Chair/Chair To Bed Outcome: Progressing Flowsheets (Taken 06/12/2021 1304) Pt will Transfer Bed to Chair/Chair to Bed: with modified independence Goal: Pt Will Ambulate Outcome: Progressing Flowsheets (Taken 06/12/2021 1304) Pt will Ambulate:  25 feet  with supervision  with cane   1:05 PM, 06/12/21 [name], MPT Physical Therapist with [facility] [phone] office [phone] mobile phone

## 2021-06-12 NOTE — Progress Notes (Signed)
Glucose of 57 at 0738, patient was given orange juice and glucose came up to 75 at 0758.

## 2021-06-13 ENCOUNTER — Telehealth: Payer: Self-pay | Admitting: Family Medicine

## 2021-06-13 ENCOUNTER — Encounter (HOSPITAL_COMMUNITY): Payer: Self-pay | Admitting: Family Medicine

## 2021-06-13 ENCOUNTER — Inpatient Hospital Stay (HOSPITAL_COMMUNITY): Payer: Medicare Other

## 2021-06-13 DIAGNOSIS — E1165 Type 2 diabetes mellitus with hyperglycemia: Secondary | ICD-10-CM

## 2021-06-13 DIAGNOSIS — E782 Mixed hyperlipidemia: Secondary | ICD-10-CM

## 2021-06-13 DIAGNOSIS — Z7189 Other specified counseling: Secondary | ICD-10-CM

## 2021-06-13 DIAGNOSIS — J9621 Acute and chronic respiratory failure with hypoxia: Secondary | ICD-10-CM

## 2021-06-13 DIAGNOSIS — Z515 Encounter for palliative care: Secondary | ICD-10-CM

## 2021-06-13 DIAGNOSIS — D631 Anemia in chronic kidney disease: Secondary | ICD-10-CM

## 2021-06-13 LAB — BASIC METABOLIC PANEL
Anion gap: 5 (ref 5–15)
BUN: 69 mg/dL — ABNORMAL HIGH (ref 8–23)
CO2: 36 mmol/L — ABNORMAL HIGH (ref 22–32)
Calcium: 8.9 mg/dL (ref 8.9–10.3)
Chloride: 97 mmol/L — ABNORMAL LOW (ref 98–111)
Creatinine, Ser: 1.56 mg/dL — ABNORMAL HIGH (ref 0.44–1.00)
GFR, Estimated: 33 mL/min — ABNORMAL LOW (ref >60)
Glucose, Bld: 91 mg/dL (ref 70–99)
Potassium: 3.7 mmol/L (ref 3.5–5.1)
Sodium: 138 mmol/L (ref 135–145)

## 2021-06-13 LAB — GLUCOSE, CAPILLARY
Glucose-Capillary: 118 mg/dL — ABNORMAL HIGH (ref 70–99)
Glucose-Capillary: 83 mg/dL (ref 70–99)
Glucose-Capillary: 210 mg/dL — ABNORMAL HIGH (ref 70–99)

## 2021-06-13 LAB — MAGNESIUM: Magnesium: 2 mg/dL (ref 1.7–2.4)

## 2021-06-13 MED ORDER — ROPINIROLE HCL 0.25 MG PO TABS: 0.2500 mg | ORAL_TABLET | Freq: Every evening | ORAL | 0 refills | Status: DC | PRN

## 2021-06-13 NOTE — Consult Note (Signed)
Consultation Note Date: 06/13/2021   Patient Name: Hannah French  DOB: 1941/07/10  MRN: [id number]  Age / Sex: 80 y.o., female  PCP: Dettinger, Fransisca Kaufmann, MD Referring Physician: Murlean Iba, MD  Reason for Consultation: Establishing goals of care and Psychosocial/spiritual support  HPI/Patient Profile: 80 y.o. 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   Clinical Assessment and Goals of Care: I have reviewed medical records including EPIC notes, labs and imaging, received report from RN, assessed the patient.  Hannah French is lying quietly in bed.  She greets me making and somewhat keeping eye contact.  She appears acutely/chronically ill and quite frail, cachectic.  She is alert and oriented, able to make her basic needs known.  Her son and daughter-in-law, Hannah French and Hannah French, are at bedside.  We met at the bedside to discuss diagnosis prognosis, GOC, EOL wishes, disposition and options, but family request that we wait for her daughter, Hannah French, to arrive before further discussions.  PMT returns later in the morning after daughter has arrived.  I introduced Palliative Medicine as specialized medical care for people living with serious illness. It focuses on providing relief from the symptoms and stress of a serious illness. The goal is to improve quality of life for both the patient and the family.  Patient and family states that they are agreeable to return home with the benefits of hospice of  [facility].  We talked about CODE STATUS in detail, Hannah French remains DNR.  She would accept the use of BiPAP for limited time trial if needed.  No CPR, no intubation.  The natural disease trajectory and expectations at EOL were discussed.  We talked about symptom management, in particular the use of morphine.  Hannah French is tearful during this discussion and family shares that even though they have reassured her, she feels that morphine means that she is dying.  We talked about signs and symptoms that would indicate the use of morphine would be helpful.  Advanced directives, concepts specific to code status, artifical feeding and hydration, and rehospitalization were considered and discussed.  We talk about rehospitalization and the concept of "preferred place of death".  Hannah French states her PPD is home.  Although Hannah French's family states that she feels "safe" in the hospital, they endorse her preferred place of death is home.  We talked about transitioning to residential hospice if/when appropriate.  Hospice and Palliative Care services outpatient were explained and offered.  Hannah French is active with [facility]  They share that their trusted hospice RN, Hannah French, will meet them at the home this afternoon.  Discussed the importance of continued conversation  with family and the medical providers regarding overall plan of care and treatment options, ensuring decisions are within the context of the patient's values and GOCs.  Questions and concerns were addressed.  The family was encouraged to call with questions or concerns.  PMT will continue to support holistically.  Conference with attending, bedside nursing staff, transition of care team related to patient condition, needs, goals of care, disposition.  HCPOA  NEXT OF KIN - Hannah French is able to make her own decisions, but son and dtr help together.     Hannah French with hospice  of [facility] already in place At this point continue to treat the treatable but no CPR or intubation Unsure if would rehospitalize at this point. Continue DNR, would accept BiPAP for limited time.    Code Status/Advance Care Planning: DNR  Symptom Management:  Per hospitalist, no additional needs at this time.   Palliative Prophylaxis:  Frequent Pain Assessment and Oral Care  Additional Recommendations (Limitations, Scope, Preferences): Continue to treat the treatable but no CPR or intubation  Psycho-social/Spiritual:  Desire for further Chaplaincy support:no Additional Recommendations: Caregiving  Support/Resources and Education on Hospice  Prognosis:  Unable to determine - 6 months or less would not be surprising based on chronic illness burden, functional status.   Discharge Planning: Home with Hospice      Primary Diagnoses: Present on Admission:  Acute and chronic respiratory failure with hypoxia (HCC)  Hyperlipidemia associated with type 2 diabetes mellitus ([place])  Mitral valve disease  PVD  Carotid stenosis  Tobacco abuse  Stage 3 severe COPD by GOLD classification ([place])  Coronary artery disease involving native coronary artery of native heart with angina pectoris (HCC)  Hyperlipidemia  Chronic combined systolic and diastolic CHF (congestive heart failure) (HCC)  Vitamin D deficiency  Cigarette smoker  Leukocytosis  CKD (chronic kidney disease), stage III (HCC)  Hyperglycemia due to diabetes mellitus ([place])  Chronic respiratory failure with hypoxia and hypercapnia ([place])  DNR (do not resuscitate)  Anemia  Thrombocytosis  Acute respiratory failure with hypoxia ([place])   I have reviewed the medical record, interviewed the patient and family, and examined the patient. The following aspects are pertinent.  Past Medical History:  Diagnosis Date   Abdominal bruit    Bladder cancer ([place])    CAD (coronary artery disease)    a. nonobstructive by cath  08/2018.   Carotid bruit    Chronic combined systolic and diastolic CHF (congestive heart failure) (HCC)    CKD (chronic kidney disease), stage II    Congestive heart failure (CHF) (HCC)    Diabetes mellitus    Type II   Dyslipidemia    Heart murmur    Mild pulmonary hypertension (HCC)    Mitral valve prolapse    a. not seen on most recent echoes. Mild MR now.   NICM (nonischemic cardiomyopathy) ([place])    Osteoporosis    Pancreatitis, acute    Pseudogout    PVD (peripheral vascular disease) ([place])    a. 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   Sinus tachycardia    Tobacco abuse    Social History   Socioeconomic History   Marital status: Divorced    Spouse name: Not on file   Number of children: 2   Years of education: Some College    Highest education French: Some college, no degree  Occupational History   Occupation: Retired  Tobacco Use   Smoking status:  Former    Packs/day: 1.00    Years: 50.00    Pack years: 50.00    Types: Cigarettes    Quit date: 04/03/2021    Years since quitting: 0.1   Smokeless tobacco: Never  Vaping Use   Vaping Use: Never used  Substance and Sexual Activity   Alcohol use: No   Drug use: No   Sexual activity: Not Currently  Other Topics Concern   Not on file  Social History Narrative   Not on file   Social Determinants of Health   Financial Resource Strain: Not on file  Food Insecurity: No Food Insecurity   Worried About Running Out of Food in the Last Year: Never true   Ran Out of Food in the Last Year: Never true  Transportation Needs: No Transportation Needs   Lack of Transportation (Medical): No   Lack of Transportation (Non-Medical): No  Physical Activity: Not on file  Stress: Not on file  Social Connections: Not on file   Family History  Problem Relation Age of Onset   Heart failure Mother    Hypertension Sister    Heart attack Brother    Heart attack Maternal Uncle     Heart disease Sister    COPD Brother    Congestive Heart Failure Brother    Congestive Heart Failure Daughter    Rheum arthritis Son    Ovarian cancer Sister    Coronary artery disease Neg Hx        Early   Scheduled Meds:  albuterol  6 puff Inhalation Once   clopidogrel  75 mg Oral Daily   doxycycline  100 mg Oral Q12H   furosemide  60 mg Intravenous Q12H   guaiFENesin  600 mg Oral BID   heparin  5,000 Units Subcutaneous Q8H   insulin aspart  0-15 Units Subcutaneous TID WC   insulin aspart  0-5 Units Subcutaneous QHS   insulin aspart  2 Units Subcutaneous TID WC   insulin glargine-yfgn  9 Units Subcutaneous Daily   ipratropium-albuterol  3 mL Nebulization TID   methylPREDNISolone (SOLU-MEDROL) injection  60 mg Intravenous Daily   nitroGLYCERIN  0.2 mg Transdermal Daily   pentoxifylline  400 mg Oral BID WC   senna-docusate  1 tablet Oral Daily   Continuous Infusions: PRN Meds:.acetaminophen **OR** acetaminophen, dextromethorphan-guaiFENesin, fentaNYL (SUBLIMAZE) injection, hydrALAZINE, ipratropium-albuterol, LORazepam, naphazoline-pheniramine, rOPINIRole Medications Prior to Admission:  Prior to Admission medications   Medication Sig Start Date End Date Taking? Authorizing Provider  acetaminophen (TYLENOL) 500 MG tablet Take 500 mg by mouth every 6 (six) hours as needed for moderate pain.   Yes [provider]  albuterol (VENTOLIN HFA) 108 (90 Base) MCG/ACT inhaler Inhale 2 puffs into the lungs every 6 (six) hours as needed for wheezing or shortness of breath. 02/02/21  Yes Martyn Ehrich, NP  Cholecalciferol (VITAMIN D) 50 MCG (2000 UT) tablet Take 4,000 Units by mouth daily.    Yes [provider]  clopidogrel (PLAVIX) 75 MG tablet Take 1 tablet (75 mg total) by mouth daily. 10/13/20  Yes Cheryln Manly, NP  dextromethorphan-guaiFENesin (MUCINEX DM) 30-600 MG 12hr tablet Take 2 tablets by mouth 2 (two) times daily as needed for cough. 05/03/21  Yes Hawks,  Christy A, FNP  empagliflozin (JARDIANCE) 10 MG TABS tablet Take 10 mg by mouth daily.   Yes [provider]  famotidine (PEPCID) 20 MG tablet Take 20 mg by mouth at bedtime. 06/07/21  Yes [provider]  insulin  aspart (NOVOLOG) 100 UNIT/ML injection Inject 0-9 Units into the skin 3 (three) times daily with meals. Sliding Scale CBG 70 - 120: 0 units  CBG 121 - 150: 1 unit  CBG 151 - 200: 2 units  CBG 201 - 250: 3 units  CBG 251 - 300: 5 units  CBG 301 - 350: 7 units  CBG 351 - 400: 9 units  CBG > 400: give 9 units and call your MD for additional instructions 04/12/21  Yes Elodia Florence., MD  insulin degludec (TRESIBA FLEXTOUCH) 100 UNIT/ML FlexTouch Pen Inject 7 Units into the skin daily. 04/12/21  Yes Elodia Florence., MD  levalbuterol Penne Lash) 0.63 MG/3ML nebulizer solution Inhale 0.63 mg into the lungs every 8 (eight) hours as needed for wheezing or shortness of breath. 04/18/21  Yes [provider]  LORazepam (ATIVAN) 0.5 MG tablet Take 0.5 mg by mouth every 3 (three) hours as needed. 04/26/21  Yes [provider]  Naphazoline-Pheniramine (OPCON-A) 0.027-0.315 % SOLN Place 1 drop into both eyes daily as needed (Burning and itching eyes).   Yes [provider]  nitroGLYCERIN (NITRODUR - DOSED IN MG/24 HR) 0.2 mg/hr patch Place 1 patch (0.2 mg total) onto the skin daily. 11/02/20  Yes Newt Minion, MD  nitroGLYCERIN (NITROSTAT) 0.3 MG SL tablet Place 1 tablet (0.3 mg total) under the tongue every 5 (five) minutes as needed for chest pain. 02/02/21 02/02/22 Yes Dettinger, Fransisca Kaufmann, MD  omeprazole (PRILOSEC) 20 MG capsule Take 20 mg by mouth every morning. 06/07/21  Yes [provider]  ondansetron (ZOFRAN) 4 MG tablet Take 1 tablet (4 mg total) by mouth every 4 (four) hours as needed for nausea or vomiting. 05/03/21  Yes Hawks, Christy A, FNP  OXYGEN Inhale 5 Doses into the lungs continuous. 5 liters n/c   Yes [provider]   predniSONE (DELTASONE) 10 MG tablet Take  4 each am x 2 days,   2 each am x 2 days,  1 each am x 2 days and stop Patient taking differently: Take 10 mg by mouth See admin instructions. Take  4 each am x 2 days,   2 each am x 2 days,  1 each am x 2 days and stop 06/06/21  Yes Tanda Rockers, MD  predniSONE (DELTASONE) 20 MG tablet Take 2 PO QAM x7days, 1 PO QAM x7days, then stop 06/13/21  Yes Johnson, Clanford L, MD  revefenacin (YUPELRI) 175 MCG/3ML nebulizer solution Take 3 mLs (175 mcg total) by nebulization daily. 06/06/21  Yes Tanda Rockers, MD  sennosides-docusate sodium (SENOKOT-S) 8.6-50 MG tablet Take 1 tablet by mouth daily. 04/04/21  Yes Dettinger, Fransisca Kaufmann, MD  Torsemide 40 MG TABS Take 60 mg by mouth daily. 06/06/21  Yes Milford, Maricela Bo, FNP  doxycycline (VIBRA-TABS) 100 MG tablet Take 1 tablet (100 mg total) by mouth every 12 (twelve) hours for 5 days. 06/12/21 06/17/21  Johnson, Clanford L, MD  Fluticasone-Umeclidin-Vilant (TRELEGY ELLIPTA) 100-62.5-25 MCG/INH AEPB Inhale 1 puff into the lungs daily. Patient not taking: Reported on 06/09/2021 02/02/21   Martyn Ehrich, NP  Glucose Blood (BLOOD GLUCOSE TEST STRIPS) STRP 1 strip by In Vitro route 2 (two) times daily. 04/01/21   Dettinger, Fransisca Kaufmann, MD  pentoxifylline (TRENTAL) 400 MG CR tablet Take 1 tablet (400 mg total) by mouth 2 (two) times daily with a meal. 06/12/21   Johnson, Clanford L, MD  potassium chloride SA (KLOR-CON) 20 MEQ tablet Take 2 tablets (  40 mEq total) by mouth as directed. Only when you take Metolazone Patient not taking: No sig reported 04/04/21   Bensimhon, Shaune Pascal, MD   Allergies  Allergen Reactions   Contrast Media [Iodinated Diagnostic Agents] Shortness Of Breath   Iohexol Other (See Comments)    PASSED OUT DURING THE TEST     Metformin Swelling    Face and hands became swollen   Statins Swelling and Other (See Comments)    They make the patient hurt "all over" Myalgia, also   Zetia [Ezetimibe]  Swelling   Bextra [Valdecoxib] Nausea And Vomiting and Swelling    Shut down my kidneys    Cefdinir Other (See Comments)    Pancreatitis    Cozaar [Losartan Potassium] Other (See Comments)    dizziness   Gabapentin    Nsaids Other (See Comments)    GI intolerance and pain all over, tolerates ibu    Rofecoxib Other (See Comments)    "VIOXX"- "SHUT DOWN MY KIDNEYS"   Sulfa Antibiotics Nausea And Vomiting and Other (See Comments)    Pancreatitis   Review of Systems  Unable to perform ROS: Other   Physical Exam Vitals and nursing note reviewed.  Constitutional:      General: She is not in acute distress.    Appearance: She is ill-appearing.  HENT:     Mouth/Throat:     Mouth: Mucous membranes are moist.  Cardiovascular:     Rate and Rhythm: Normal rate.  Pulmonary:     Effort: Pulmonary effort is normal. No respiratory distress.  Skin:    General: Skin is warm and dry.  Neurological:     Mental Status: She is alert and oriented to person, place, and time.  Psychiatric:        Mood and Affect: Mood normal.        Behavior: Behavior normal.    Vital Signs: BP (!) 120/55 (BP Location: Right Arm)   Pulse 77   Temp 97.9 F (36.6 C)   Resp 18   Ht 4' 11" (1.499 m)   Wt 55.8 kg   SpO2 97%   BMI 24.85 kg/m  Pain Scale: 0-10   Pain Score: 0-No pain   SpO2: SpO2: 97 % O2 Device:SpO2: 97 % O2 Flow Rate: .O2 Flow Rate (L/min): 2.5 L/min  IO: Intake/output summary:  Intake/Output Summary (Last 24 hours) at 06/13/2021 0943 Last data filed at 06/13/2021 0900 Gross per 24 hour  Intake 720 ml  Output --  Net 720 ml    LBM: Last BM Date: 06/10/21 Baseline Weight: Weight: 53 kg Most recent weight: Weight: 55.8 kg     Palliative Assessment/Data:   Flowsheet Rows    Flowsheet Row Most Recent Value  Intake Tab   Referral Department Hospitalist  Unit at Time of Referral Med/Surg Unit  Date Notified 06/09/21  Palliative Care Type New Palliative care  Reason for  referral Clarify Goals of Care  Date of Admission 06/09/21  Date first seen by Palliative Care 06/13/21  # of days Palliative referral response time 4 Day(s)  # of days IP prior to Palliative referral 0  Clinical Assessment   Palliative Performance Scale Score 40%  Pain Max last 24 hours Not able to report  Pain Min Last 24 hours Not able to report  Dyspnea Max Last 24 Hours Not able to report  Dyspnea Min Last 24 hours Not able to report  Psychosocial & Spiritual Assessment   Palliative Care Outcomes  Time In: 0850  Time Out: 1000  Time Total: 70 minutes  Greater than 50%  of this time was spent counseling and coordinating care related to the above assessment and plan.  Signed by: Drue Novel, NP   Please contact Palliative Medicine Team phone at [phone] for questions and concerns.  For individual provider: See Shea Evans

## 2021-06-13 NOTE — Plan of Care (Signed)
  Problem: Education: Goal: Knowledge of General Education information will improve Description: Including pain rating scale, medication(s)/side effects and non-pharmacologic comfort measures Outcome: Progressing   Problem: Health Behavior/Discharge Planning: Goal: Ability to manage health-related needs will improve Outcome: Progressing   Problem: Clinical Measurements: Goal: Ability to maintain clinical measurements within normal limits will improve Outcome: Progressing Goal: Will remain free from infection Outcome: Progressing Goal: Diagnostic test results will improve Outcome: Progressing   Problem: Nutrition: Goal: Adequate nutrition will be maintained Outcome: Progressing   Problem: Coping: Goal: Level of anxiety will decrease Outcome: Progressing   Problem: Elimination: Goal: Will not experience complications related to bowel motility Outcome: Progressing Goal: Will not experience complications related to urinary retention Outcome: Progressing   Problem: Pain Managment: Goal: General experience of comfort will improve Outcome: Progressing   Problem: Safety: Goal: Ability to remain free from injury will improve Outcome: Progressing   Problem: Skin Integrity: Goal: Risk for impaired skin integrity will decrease Outcome: Progressing   Problem: Clinical Measurements: Goal: Respiratory complications will improve Outcome: Not Progressing Goal: Cardiovascular complication will be avoided Outcome: Not Progressing   Problem: Activity: Goal: Risk for activity intolerance will decrease Outcome: Not Progressing

## 2021-06-13 NOTE — Discharge Summary (Signed)
Physician Discharge Summary  Hannah French YYT:[id number] DOB: Sep 30, 1940 DOA: 06/09/2021  PCP: Dettinger, Fransisca Kaufmann, MD  Admit date: 06/09/2021 Discharge date: 06/13/2021  Admitted From:  Home with hospice Disposition:  Home with hospice   Recommendations for Outpatient Follow-up:  Follow up with PCP as scheduled Follow up with pulmonologist and cardiologist as scheduled Please call hospice nurse First for any problems or concerns  Home Health:  Home Hospice   Discharge Condition: Hospice   CODE STATUS: DNR Diet: heart healthy    Brief Hospitalization Summary: Please see all hospital notes, images, labs for full details of the hospitalization. Brief Admission History:  80 y.o. 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  She is DNR.  She has been having difficulty with increasing dyspnea over the past weeks.  She has had increasing weight gain and reported to her daughter that she is afraid to sleep alone at night.  Daughter has been staying with her for the past several days and reports recently changed to torsemide due to difficulty diuresing with Lasix.  She continues to have shortness of breath that acutely became worse in the early morning hours prompting daughter to call EMS.  They gave her a dose of Solu-Medrol 125 mg and started her on 15 L nonrebreather due to respiratory distress.   Assessment & Plan:   Principal Problem:   Acute and chronic respiratory failure with hypoxia (HCC) Active Problems:   Hyperlipidemia associated with type 2 diabetes mellitus (HCC)   Mitral valve disease    PVD   Carotid stenosis   Type 2 diabetes mellitus (HCC)   Tobacco abuse   Stage 3 severe COPD by GOLD classification ([place])   Coronary artery disease involving native coronary artery of native heart with angina pectoris (HCC)   Hyperlipidemia   Chronic combined systolic and diastolic CHF (congestive heart failure) (HCC)   Vitamin D deficiency   Cigarette smoker   Acute respiratory failure with hypoxia (HCC)   Leukocytosis   CKD (chronic kidney disease), stage III (HCC)   Hyperglycemia due to diabetes mellitus ([place])   Chronic respiratory failure with hypoxia and hypercapnia ([place])   DNR (do not resuscitate)   Hospice care patient   Anemia   Thrombocytosis   Acute on chronic respiratory failure with hypoxia-likely this is a combination of her advanced lung disease COPD exacerbation superimposed with in a systolic heart failure exacerbation.  She has been treated with IV Lasix for diuresis continue BiPAP therapy wean as able and would not escalate therapy given DNR and hospice status.  I discussed this with her daughter and son.  Primary goal of care should be focusing on comfort.  She is off bipap and back to baseline nasal cannula.     Pt is now off bipap therapy and back on nasal cannula, she is down to 4L/min.  Reduce solumedrol to once daily.  DC home on oral prednisone to taper.  Continue bronchodilator therapy.  Family met with palliative care 9/19 to discuss further goals and needs.    Acute systolic heart failure exacerbation-patient had recently been started on torsemide however symptoms persisted and presented in acute exacerbation.  She has been treated  with IV Lasix for diuresis and will follow closely monitoring intake and output and electrolytes closely.  Did not repeat 2D echo as she had it done 2 months ago with an EF reported as 25-30%.  resume home torsemide 60 mg daily at discharge, outpatient follow up with Dr. Haroldine Laws.    Intake / Output and weights not measured  accurately unfortunately Treated with IV lasix 60 mg BID Clinically improved Pt urinated well on this regimen   Type 2 diabetes mellitus with complications-patient having some steroid-induced hyperglycemia which we will treat as needed with supplemental sliding scale coverage and CBG monitoring.   CBG (last 3)  Recent Labs    06/12/21 1620 06/12/21 2043 06/13/21 0720  GLUCAP 369* 304* 83   Treated with IV steroids.  DC home on prednisone taper.      Anemia in chronic kidney disease-continue to monitor CBC daily and transfuse as needed for comfort.  No signs of active bleeding at this time.  Stable    Leukocytosis-likely secondary to steroid use monitor CBC with differential. RESOLVED   Coronary artery disease-no signs of active chest pain symptoms at this time.  EKG with no acute findings.  Follow clinically.  Hyperlipidemia-resume home medications.  Peripheral vascular disease-currently stable.   Tobacco-discussed with daughter at bedside was strongly advised patient to stop all tobacco use but given that she is now on hospice they may decide to allow her to continue smoking regardless of the consequences.   offer a nicotine patch.  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  RESOLVED  DNR-confirmed with daughter at bedside continue DNR order in hospital with no plans to escalate.  I did ask for a palliative medicine consultation. They saw her on 9/19 and discussed goals and needs.      DVT prophylaxis: SQ heparin Code Status: DNR Family Communication: son 9/18 Disposition:  home with hospice Status is: Inpatient   Discharge Diagnoses:  Principal Problem:   Acute and chronic respiratory failure with hypoxia (HCC) Active Problems:   Hyperlipidemia associated with type 2 diabetes mellitus ([place])   Mitral valve disease   PVD   Carotid stenosis   Type 2 diabetes mellitus (HCC)   Tobacco abuse   Stage 3 severe COPD by GOLD classification  ([place])   Coronary artery disease involving native coronary artery of native heart with angina pectoris (HCC)   Hyperlipidemia   Chronic combined systolic and diastolic CHF (congestive heart failure) (HCC)   Vitamin D deficiency   Cigarette smoker   Acute respiratory failure with hypoxia (HCC)   Leukocytosis   CKD (chronic kidney disease), stage III (HCC)   Hyperglycemia due to diabetes mellitus ([place])   Chronic respiratory failure with hypoxia and hypercapnia ([place])   DNR (do not resuscitate)   Hospice care patient   Anemia   Thrombocytosis   Discharge Instructions:  Allergies as of 06/13/2021       Reactions   Contrast Media [iodinated Diagnostic Agents] Shortness Of Breath   Iohexol Other (See Comments)   PASSED OUT DURING THE TEST   Metformin Swelling   Face and hands became swollen   Statins Swelling, Other (See Comments)   They make the patient hurt "all over" Myalgia, also   Zetia [ezetimibe] Swelling   Bextra [valdecoxib] Nausea And Vomiting, Swelling   Shut down my kidneys    Cefdinir Other (See Comments)   Pancreatitis   Cozaar [losartan Potassium] Other (See Comments)   dizziness   Gabapentin  Nsaids Other (See Comments)   GI intolerance and pain all over, tolerates ibu   Rofecoxib Other (See Comments)   "VIOXX"- "SHUT DOWN MY KIDNEYS"   Sulfa Antibiotics Nausea And Vomiting, Other (See Comments)   Pancreatitis        Medication List     STOP taking these medications    potassium chloride SA 20 MEQ tablet Commonly known as: KLOR-CON   Yupelri 175 MCG/3ML nebulizer solution Generic drug: revefenacin       TAKE these medications    acetaminophen 500 MG tablet Commonly known as: TYLENOL Take 500 mg by mouth every 6 (six) hours as needed for moderate pain.   albuterol 108 (90 Base) MCG/ACT inhaler Commonly known as: VENTOLIN HFA Inhale 2 puffs into the lungs every 6 (six) hours as needed for wheezing or shortness of breath.   BLOOD GLUCOSE  TEST STRIPS Strp 1 strip by In Vitro route 2 (two) times daily.   clopidogrel 75 MG tablet Commonly known as: Plavix Take 1 tablet (75 mg total) by mouth daily.   dextromethorphan-guaiFENesin 30-600 MG 12hr tablet Commonly known as: MUCINEX DM Take 2 tablets by mouth 2 (two) times daily as needed for cough.   doxycycline 100 MG tablet Commonly known as: VIBRA-TABS Take 1 tablet (100 mg total) by mouth every 12 (twelve) hours for 5 days.   empagliflozin 10 MG Tabs tablet Commonly known as: JARDIANCE Take 10 mg by mouth daily.   famotidine 20 MG tablet Commonly known as: PEPCID Take 20 mg by mouth at bedtime.   insulin aspart 100 UNIT/ML injection Commonly known as: novoLOG Inject 0-9 Units into the skin 3 (three) times daily with meals. Sliding Scale CBG 70 - 120: 0 units  CBG 121 - 150: 1 unit  CBG 151 - 200: 2 units  CBG 201 - 250: 3 units  CBG 251 - 300: 5 units  CBG 301 - 350: 7 units  CBG 351 - 400: 9 units  CBG > 400: give 9 units and call your MD for additional instructions   levalbuterol 0.63 MG/3ML nebulizer solution Commonly known as: XOPENEX Inhale 0.63 mg into the lungs every 8 (eight) hours as needed for wheezing or shortness of breath.   LORazepam 0.5 MG tablet Commonly known as: ATIVAN Take 0.5 mg by mouth every 3 (three) hours as needed.   nitroGLYCERIN 0.2 mg/hr patch Commonly known as: NITRODUR - Dosed in mg/24 hr Place 1 patch (0.2 mg total) onto the skin daily.   nitroGLYCERIN 0.3 MG SL tablet Commonly known as: Nitrostat Place 1 tablet (0.3 mg total) under the tongue every 5 (five) minutes as needed for chest pain.   omeprazole 20 MG capsule Commonly known as: PRILOSEC Take 20 mg by mouth every morning.   ondansetron 4 MG tablet Commonly known as: Zofran Take 1 tablet (4 mg total) by mouth every 4 (four) hours as needed for nausea or vomiting.   Opcon-A 0.027-0.315 % Soln Generic drug: Naphazoline-Pheniramine Place 1 drop into both eyes  daily as needed (Burning and itching eyes).   OXYGEN Inhale 5 Doses into the lungs continuous. 5 liters n/c   pentoxifylline 400 MG CR tablet Commonly known as: TRENTAL Take 1 tablet (400 mg total) by mouth 2 (two) times daily with a meal. What changed: See the new instructions.   predniSONE 20 MG tablet Commonly known as: DELTASONE Take 2 PO QAM x7days, 1 PO QAM x7days, then stop What changed:  medication strength additional instructions  sennosides-docusate sodium 8.6-50 MG tablet Commonly known as: SENOKOT-S Take 1 tablet by mouth daily.   Torsemide 40 MG Tabs Take 60 mg by mouth daily.   Trelegy Ellipta 100-62.5-25 MCG/INH Aepb Generic drug: Fluticasone-Umeclidin-Vilant Inhale 1 puff into the lungs daily.   Tyler Aas FlexTouch 100 UNIT/ML FlexTouch Pen Generic drug: insulin degludec Inject 7 Units into the skin daily.   Vitamin D 50 MCG (2000 UT) tablet Take 4,000 Units by mouth daily.        Follow-up Information     Dettinger, Fransisca Kaufmann, MD. Schedule an appointment as soon as possible for a visit in 1 week(s).   Specialties: Family Medicine, Cardiology Why: Hospital Follow Up Contact information: [place] [address] [phone]         Bensimhon, Shaune Pascal, MD. Schedule an appointment as soon as possible for a visit in 2 week(s).   Specialty: Cardiology Why: Hospital Follow Up Contact information: [place] [address] [phone]         Vickie Epley, MD .   Specialties: Cardiology, Radiology Contact information: [place] [address] [phone]         [facility]. Schedule an appointment as soon as possible for a visit in 2 week(s).   Specialty: Pulmonology Why: Hospital Follow Up Contact information: [address], [address] [place] [id number] [place] [facility] Follow up.    Specialty: Hospice Contact information: [address] [place] [address] [phone]               Allergies  Allergen Reactions   Contrast Media [Iodinated Diagnostic Agents] Shortness Of Breath   Iohexol Other (See Comments)    PASSED OUT DURING THE TEST     Metformin Swelling    Face and hands became swollen   Statins Swelling and Other (See Comments)    They make the patient hurt "all over" Myalgia, also   Zetia [Ezetimibe] Swelling   Bextra [Valdecoxib] Nausea And Vomiting and Swelling    Shut down my kidneys    Cefdinir Other (See Comments)    Pancreatitis    Cozaar [Losartan Potassium] Other (See Comments)    dizziness   Gabapentin    Nsaids Other (See Comments)    GI intolerance and pain all over, tolerates ibu    Rofecoxib Other (See Comments)    "VIOXX"- "SHUT DOWN MY KIDNEYS"   Sulfa Antibiotics Nausea And Vomiting and Other (See Comments)    Pancreatitis   Allergies as of 06/13/2021       Reactions   Contrast Media [iodinated Diagnostic Agents] Shortness Of Breath   Iohexol Other (See Comments)   PASSED OUT DURING THE TEST   Metformin Swelling   Face and hands became swollen   Statins Swelling, Other (See Comments)   They make the patient hurt "all over" Myalgia, also   Zetia [ezetimibe] Swelling   Bextra [valdecoxib] Nausea And Vomiting, Swelling   Shut down my kidneys    Cefdinir Other (See Comments)   Pancreatitis   Cozaar [losartan Potassium] Other (See Comments)   dizziness   Gabapentin    Nsaids Other (See Comments)   GI intolerance and pain all over, tolerates ibu   Rofecoxib Other (See Comments)   "VIOXX"- "SHUT DOWN MY KIDNEYS"   Sulfa Antibiotics Nausea And Vomiting, Other (See Comments)   Pancreatitis  Medication List     STOP taking these medications    potassium chloride SA 20 MEQ tablet Commonly known as: KLOR-CON   Yupelri 175 MCG/3ML nebulizer solution Generic drug: revefenacin        TAKE these medications    acetaminophen 500 MG tablet Commonly known as: TYLENOL Take 500 mg by mouth every 6 (six) hours as needed for moderate pain.   albuterol 108 (90 Base) MCG/ACT inhaler Commonly known as: VENTOLIN HFA Inhale 2 puffs into the lungs every 6 (six) hours as needed for wheezing or shortness of breath.   BLOOD GLUCOSE TEST STRIPS Strp 1 strip by In Vitro route 2 (two) times daily.   clopidogrel 75 MG tablet Commonly known as: Plavix Take 1 tablet (75 mg total) by mouth daily.   dextromethorphan-guaiFENesin 30-600 MG 12hr tablet Commonly known as: MUCINEX DM Take 2 tablets by mouth 2 (two) times daily as needed for cough.   doxycycline 100 MG tablet Commonly known as: VIBRA-TABS Take 1 tablet (100 mg total) by mouth every 12 (twelve) hours for 5 days.   empagliflozin 10 MG Tabs tablet Commonly known as: JARDIANCE Take 10 mg by mouth daily.   famotidine 20 MG tablet Commonly known as: PEPCID Take 20 mg by mouth at bedtime.   insulin aspart 100 UNIT/ML injection Commonly known as: novoLOG Inject 0-9 Units into the skin 3 (three) times daily with meals. Sliding Scale CBG 70 - 120: 0 units  CBG 121 - 150: 1 unit  CBG 151 - 200: 2 units  CBG 201 - 250: 3 units  CBG 251 - 300: 5 units  CBG 301 - 350: 7 units  CBG 351 - 400: 9 units  CBG > 400: give 9 units and call your MD for additional instructions   levalbuterol 0.63 MG/3ML nebulizer solution Commonly known as: XOPENEX Inhale 0.63 mg into the lungs every 8 (eight) hours as needed for wheezing or shortness of breath.   LORazepam 0.5 MG tablet Commonly known as: ATIVAN Take 0.5 mg by mouth every 3 (three) hours as needed.   nitroGLYCERIN 0.2 mg/hr patch Commonly known as: NITRODUR - Dosed in mg/24 hr Place 1 patch (0.2 mg total) onto the skin daily.   nitroGLYCERIN 0.3 MG SL tablet Commonly known as: Nitrostat Place 1 tablet (0.3 mg total) under the tongue every 5 (five) minutes as needed for  chest pain.   omeprazole 20 MG capsule Commonly known as: PRILOSEC Take 20 mg by mouth every morning.   ondansetron 4 MG tablet Commonly known as: Zofran Take 1 tablet (4 mg total) by mouth every 4 (four) hours as needed for nausea or vomiting.   Opcon-A 0.027-0.315 % Soln Generic drug: Naphazoline-Pheniramine Place 1 drop into both eyes daily as needed (Burning and itching eyes).   OXYGEN Inhale 5 Doses into the lungs continuous. 5 liters n/c   pentoxifylline 400 MG CR tablet Commonly known as: TRENTAL Take 1 tablet (400 mg total) by mouth 2 (two) times daily with a meal. What changed: See the new instructions.   predniSONE 20 MG tablet Commonly known as: DELTASONE Take 2 PO QAM x7days, 1 PO QAM x7days, then stop What changed:  medication strength additional instructions   sennosides-docusate sodium 8.6-50 MG tablet Commonly known as: SENOKOT-S Take 1 tablet by mouth daily.   Torsemide 40 MG Tabs Take 60 mg by mouth daily.   Trelegy Ellipta 100-62.5-25 MCG/INH Aepb Generic drug: Fluticasone-Umeclidin-Vilant Inhale 1 puff into the lungs daily.   Tyler Aas  FlexTouch 100 UNIT/ML FlexTouch Pen Generic drug: insulin degludec Inject 7 Units into the skin daily.   Vitamin D 50 MCG (2000 UT) tablet Take 4,000 Units by mouth daily.        Procedures/Studies: DG CHEST PORT 1 VIEW  Result Date: 06/13/2021 CLINICAL DATA:  Acute on chronic respiratory failure with hypoxia. EXAM: PORTABLE CHEST 1 VIEW COMPARISON:  June 10, 2021. FINDINGS: Stable cardiomediastinal silhouette. No pneumothorax or pleural effusion is noted. Stable interstitial densities are noted throughout both lungs most consistent with pulmonary edema. Bony thorax is unremarkable. IMPRESSION: Stable bilateral interstitial densities are noted most consistent with pulmonary edema. Aortic Atherosclerosis (ICD10-I70.0). Electronically Signed   By: Marijo Conception M.D.   On: 06/13/2021 08:20   DG Chest Port 1  View  Result Date: 06/10/2021 CLINICAL DATA:  Volume overload. EXAM: PORTABLE CHEST 1 VIEW COMPARISON:  Yesterday FINDINGS: Continued diffuse interstitial opacity with Kerley lines and probable trace pleural fluid. Normal heart size and stable mediastinal contours. No focal opacity. IMPRESSION: Persistent pulmonary edema. Electronically Signed   By: Jorje Guild M.D.   On: 06/10/2021 05:17   DG Chest Port 1 View  Result Date: 06/09/2021 CLINICAL DATA:  Short of breath.  History of cancer. EXAM: PORTABLE CHEST 1 VIEW COMPARISON:  05/02/2021 FINDINGS: Interval development of diffuse interstitial densities throughout both lungs. Small right effusion noted. Heart size upper normal. Atherosclerotic aortic arch. IMPRESSION: Interval development of diffuse interstitial edema most likely due to fluid overload. Small right effusion. Electronically Signed   By: Franchot Gallo M.D.   On: 06/09/2021 07:58     Subjective: Pt says she is breathing much better and feels well enough to go home.    Discharge Exam: Vitals:   06/13/21 0343 06/13/21 0907  BP: (!) 120/55   Pulse: 77   Resp: 18   Temp: 97.9 F (36.6 C)   SpO2: 96% 97%   Vitals:   06/12/21 2025 06/12/21 2042 06/13/21 0343 06/13/21 0907  BP:  (!) 120/53 (!) 120/55   Pulse:  87 77   Resp:  20 18   Temp:  98 F (36.7 C) 97.9 F (36.6 C)   TempSrc:      SpO2: 98% 98% 96% 97%  Weight:   55.8 kg   Height:       General exam: frail elderly female, awake, alert, Appears calm and comfortable on nasal cannula.  Respiratory system: poor air movement, crackles seem to be resolved. No increased work of breathing.  Cardiovascular system: normal S1 & S2 heard. No JVD, murmurs, rubs, gallops or clicks. No pedal edema. Gastrointestinal system: Abdomen is nondistended, soft and nontender. No organomegaly or masses felt. Normal bowel sounds heard. Central nervous system: Alert and oriented. No focal neurological deficits. Extremities: Symmetric 5 x  5 power. Skin: No rashes, lesions or ulcers Psychiatry: Judgement and insight appear normal. Mood & affect appropriate   The results of significant diagnostics from this hospitalization (including imaging, microbiology, ancillary and laboratory) are listed below for reference.     Microbiology: Recent Results (from the past 240 hour(s))  Resp Panel by RT-PCR (Flu A&B, Covid) Nasopharyngeal Swab     Status: None   Collection Time: 06/09/21  6:20 AM   Specimen: Nasopharyngeal Swab; Nasopharyngeal(NP) swabs in vial transport medium  Result Value Ref Range Status   SARS Coronavirus 2 by RT PCR NEGATIVE NEGATIVE Final    Comment: (NOTE) SARS-CoV-2 target nucleic acids are NOT DETECTED.  The SARS-CoV-2 RNA is generally  detectable in upper respiratory specimens during the acute phase of infection. The lowest concentration of SARS-CoV-2 viral copies this assay can detect is 138 copies/mL. A negative result does not preclude SARS-Cov-2 infection and should not be used as the sole basis for treatment or other patient management decisions. A negative result may occur with  improper specimen collection/handling, submission of specimen other than nasopharyngeal swab, presence of viral mutation(s) within the areas targeted by this assay, and inadequate number of viral copies(<138 copies/mL). A negative result must be combined with clinical observations, patient history, and epidemiological information. The expected result is Negative.  Fact Sheet for Patients:  EntrepreneurPulse.com.au  Fact Sheet for Healthcare Providers:  IncredibleEmployment.be  This test is no t yet approved or cleared by the [place] FDA and  has been authorized for detection and/or diagnosis of SARS-CoV-2 by FDA under an Emergency Use Authorization (EUA). This EUA will remain  in effect (meaning this test can be used) for the duration of the COVID-19 declaration under Section  564(b)(1) of the Act, 21 U.S.C.section 360bbb-3(b)(1), unless the authorization is terminated  or revoked sooner.       Influenza A by PCR NEGATIVE NEGATIVE Final   Influenza B by PCR NEGATIVE NEGATIVE Final    Comment: (NOTE) The Xpert Xpress SARS-CoV-2/FLU/RSV plus assay is intended as an aid in the diagnosis of influenza from Nasopharyngeal swab specimens and should not be used as a sole basis for treatment. Nasal washings and aspirates are unacceptable for Xpert Xpress SARS-CoV-2/FLU/RSV testing.  Fact Sheet for Patients: EntrepreneurPulse.com.au  Fact Sheet for Healthcare Providers: IncredibleEmployment.be  This test is not yet approved or cleared by the [place] FDA and has been authorized for detection and/or diagnosis of SARS-CoV-2 by FDA under an Emergency Use Authorization (EUA). This EUA will remain in effect (meaning this test can be used) for the duration of the COVID-19 declaration under Section 564(b)(1) of the Act, 21 U.S.C. section 360bbb-3(b)(1), unless the authorization is terminated or revoked.  Performed at [facility], [address], [place], [place] [address]      Labs: BNP (last 3 results) Recent Labs    06/10/21 0434 06/11/21 0442 06/12/21 0432  BNP 842.0* 623.0* 144.3*   Basic Metabolic Panel: Recent Labs  Lab 06/09/21 0658 06/10/21 0434 06/11/21 0442 06/12/21 0433 06/13/21 0545  NA 136 136 137 139 138  K 3.7 4.2 4.1 3.7 3.7  CL 97* 97* 100 100 97*  CO2 '29 29 28 31 ' 36*  GLUCOSE 261* 243* 241* 114* 91  BUN 46* 52* 59* 64* 69*  CREATININE 1.81* 1.78* 1.67* 1.53* 1.56*  CALCIUM 8.9 8.7* 8.6* 8.9 8.9  MG  --  2.8* 2.4 2.2 2.0   Liver Function Tests: Recent Labs  Lab 06/09/21 0658  AST 30  ALT 20  ALKPHOS 58  BILITOT 1.1  PROT 7.3  ALBUMIN 4.1   No results for input(s): LIPASE, AMYLASE in the last 168 hours. No results for input(s): AMMONIA in the last 168 hours. CBC: Recent Labs   Lab 06/09/21 0658 06/10/21 0434 06/11/21 0442  WBC 16.0* 7.8 7.0  NEUTROABS 12.9* 6.7 6.1  HGB 10.1* 8.7* 8.3*  HCT 32.3* 27.3* 26.4*  MCV 103.5* 103.0* 103.5*  PLT 549* 396 381   Cardiac Enzymes: No results for input(s): CKTOTAL, CKMB, CKMBINDEX, TROPONINI in the last 168 hours. BNP: Invalid input(s): POCBNP CBG: Recent Labs  Lab 06/12/21 0757 06/12/21 1100 06/12/21 1620 06/12/21 2043 06/13/21 0720  GLUCAP 75 234* 369* 304* 83  D-Dimer No results for input(s): DDIMER in the last 72 hours. Hgb A1c No results for input(s): HGBA1C in the last 72 hours. Lipid Profile No results for input(s): CHOL, HDL, LDLCALC, TRIG, CHOLHDL, LDLDIRECT in the last 72 hours. Thyroid function studies No results for input(s): TSH, T4TOTAL, T3FREE, THYROIDAB in the last 72 hours.  Invalid input(s): FREET3 Anemia work up No results for input(s): VITAMINB12, FOLATE, FERRITIN, TIBC, IRON, RETICCTPCT in the last 72 hours. Urinalysis    Component Value Date/Time   COLORURINE YELLOW 04/06/2021 1304   APPEARANCEUR CLEAR 04/06/2021 1304   APPEARANCEUR Clear 05/16/2018 1629   LABSPEC 1.010 04/06/2021 1304   PHURINE 5.0 04/06/2021 1304   GLUCOSEU 150 (A) 04/06/2021 1304   HGBUR MODERATE (A) 04/06/2021 1304   BILIRUBINUR NEGATIVE 04/06/2021 1304   BILIRUBINUR Negative 05/16/2018 [place] 04/06/2021 1304   PROTEINUR NEGATIVE 04/06/2021 1304   UROBILINOGEN negative 06/09/2014 1043   NITRITE NEGATIVE 04/06/2021 1304   LEUKOCYTESUR NEGATIVE 04/06/2021 1304   Sepsis Labs Invalid input(s): PROCALCITONIN,  WBC,  LACTICIDVEN Microbiology Recent Results (from the past 240 hour(s))  Resp Panel by RT-PCR (Flu A&B, Covid) Nasopharyngeal Swab     Status: None   Collection Time: 06/09/21  6:20 AM   Specimen: Nasopharyngeal Swab; Nasopharyngeal(NP) swabs in vial transport medium  Result Value Ref Range Status   SARS Coronavirus 2 by RT PCR NEGATIVE NEGATIVE Final    Comment:  (NOTE) SARS-CoV-2 target nucleic acids are NOT DETECTED.  The SARS-CoV-2 RNA is generally detectable in upper respiratory specimens during the acute phase of infection. The lowest concentration of SARS-CoV-2 viral copies this assay can detect is 138 copies/mL. A negative result does not preclude SARS-Cov-2 infection and should not be used as the sole basis for treatment or other patient management decisions. A negative result may occur with  improper specimen collection/handling, submission of specimen other than nasopharyngeal swab, presence of viral mutation(s) within the areas targeted by this assay, and inadequate number of viral copies(<138 copies/mL). A negative result must be combined with clinical observations, patient history, and epidemiological information. The expected result is Negative.  Fact Sheet for Patients:  EntrepreneurPulse.com.au  Fact Sheet for Healthcare Providers:  IncredibleEmployment.be  This test is no t yet approved or cleared by the [place] FDA and  has been authorized for detection and/or diagnosis of SARS-CoV-2 by FDA under an Emergency Use Authorization (EUA). This EUA will remain  in effect (meaning this test can be used) for the duration of the COVID-19 declaration under Section 564(b)(1) of the Act, 21 U.S.C.section 360bbb-3(b)(1), unless the authorization is terminated  or revoked sooner.       Influenza A by PCR NEGATIVE NEGATIVE Final   Influenza B by PCR NEGATIVE NEGATIVE Final    Comment: (NOTE) The Xpert Xpress SARS-CoV-2/FLU/RSV plus assay is intended as an aid in the diagnosis of influenza from Nasopharyngeal swab specimens and should not be used as a sole basis for treatment. Nasal washings and aspirates are unacceptable for Xpert Xpress SARS-CoV-2/FLU/RSV testing.  Fact Sheet for Patients: EntrepreneurPulse.com.au  Fact Sheet for Healthcare  Providers: IncredibleEmployment.be  This test is not yet approved or cleared by the [place] FDA and has been authorized for detection and/or diagnosis of SARS-CoV-2 by FDA under an Emergency Use Authorization (EUA). This EUA will remain in effect (meaning this test can be used) for the duration of the COVID-19 declaration under Section 564(b)(1) of the Act, 21 U.S.C. section 360bbb-3(b)(1), unless the authorization is terminated  or revoked.  Performed at [facility], [address], [place],  [address]     Time coordinating discharge: 37 mins   SIGNED:  Irwin Brakeman, MD  [facility] 06/13/2021, 10:32 AM How to contact the [facility] Attending or Consulting provider [place] or covering provider during after hours [place], for this patient?  Check the care team in [facility] and look for a) attending/consulting TRH provider listed and b) the [facility] team listed Log into [email or web address] and use [place]'s universal password to access. If you do not have the password, please contact the hospital operator. Locate the [facility] provider you are looking for under [facility] and page to a number that you can be directly reached. If you still have difficulty reaching the provider, please page the [facility] (Director on Call) for the Hospitalists listed on amion for assistance.

## 2021-06-13 NOTE — TOC Transition Note (Signed)
Transition of Care [facility]) - CM/SW Discharge Note   Patient Details  Name: Hannah French MRN: [id number] Date of Birth: 03/14/1941  Transition of Care [facility]) CM/SW Contact:  Ihor Gully, LCSW Phone Number: 06/13/2021, 11:52 AM   Clinical Narrative:    Patient from home, active with [facility]. 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 Discharging with active hospice already in place. Family will transport home.    Final next level of care: Home w Hospice Care Barriers to Discharge: No Barriers Identified   Patient Goals and CMS Choice        Discharge Placement                       Discharge Plan and Services                                     Social Determinants of Health (SDOH) Interventions     Readmission Risk Interventions Readmission Risk Prevention Plan 04/12/2021 01/18/2021  Transportation Screening Complete Complete  HRI or [place] - Complete  Social Work Consult for [place] Planning/Counseling - Complete  Palliative Care Screening - Not Applicable  Medication Review Press photographer) Complete Complete  PCP or Specialist appointment within 3-5 days of discharge Complete -  [place] or Home Care Consult Complete -  SW Recovery Care/Counseling Consult Complete -  Palliative Care Screening Not Applicable -  [place] Not Applicable -  Some recent data might be hidden

## 2021-06-13 NOTE — Progress Notes (Signed)
Discharge instructions given to patient and family. Patient and family verbalized understanding. Discharged via wheelchair.

## 2021-06-14 ENCOUNTER — Telehealth: Payer: Self-pay

## 2021-06-14 ENCOUNTER — Ambulatory Visit: Payer: Medicare Other | Admitting: Cardiology

## 2021-06-14 NOTE — Telephone Encounter (Signed)
Transition Care Management Follow-up Telephone Call Date of discharge and from where: 06/13/21 [facility] Diagnosis: Respiratory Failure with hypoxia How have you been since you were released from the hospital? She is doing a little better Any questions or concerns? No  Items Reviewed: Did the pt receive and understand the discharge instructions provided? Yes  Medications obtained and verified? Yes  Other? No  Any new allergies since your discharge? No  Dietary orders reviewed? Yes Do you have support at home? Yes   Home Care and Equipment/Supplies: Were home health services ordered? yes If so, what is the name of the agency? [facility].  Has the agency set up a time to come to the patient's home? yes Were any new equipment or medical supplies ordered?  No What is the name of the medical supply agency? N/a Were you able to get the supplies/equipment? not applicable Do you have any questions related to the use of the equipment or supplies? No  Functional Questionnaire: (I = Independent and D = Dependent) ADLs: I with assistance  Bathing/Dressing- I with assistance  Meal Prep- D  Eating- I  Maintaining continence- I sometimes  Transferring/Ambulation- I with walker - short distances  Managing Meds- D  Follow up appointments reviewed:  PCP Hospital f/u appt confirmed? Yes  Scheduled to see [place] on 06/23/21 @ 9:55. [place] [facility] f/u appt confirmed? Yes  Scheduled to see [place] Are transportation arrangements needed?  Doesn't feel she can make it to the office right now - too weak - would like telephone call If their condition worsens, is the pt aware to call PCP or go to the Emergency Dept.? Yes Was the patient provided with contact information for the PCP's office or ED? Yes Was to pt encouraged to call back with questions or concerns? Yes

## 2021-06-16 ENCOUNTER — Telehealth: Payer: Self-pay | Admitting: *Deleted

## 2021-06-16 DIAGNOSIS — E1165 Type 2 diabetes mellitus with hyperglycemia: Secondary | ICD-10-CM | POA: Diagnosis not present

## 2021-06-16 DIAGNOSIS — I5042 Chronic combined systolic (congestive) and diastolic (congestive) heart failure: Secondary | ICD-10-CM

## 2021-06-16 NOTE — Telephone Encounter (Signed)
Hannah French would like to know if Dr. Warrick Parisian can place order for BMP so she can draw & bring it to the office  Also hospital stopped her K+ but she is still taking torsemide, K+ level on 9/19 was 3.7 Changed [place] from TID to BID and put her Requip 0.25 1 QHS prn and its helping her legs

## 2021-06-16 NOTE — Telephone Encounter (Signed)
Please let patient know:  We do not carry requip samples (its generic)  I"m not sure what son Darnell Level) is referring to  If they need tegaderm--can purchase at pharmacy to cover [place] device   They can schedule CCM with me if they need additional help, but I was aware that hospice was taking over at this point.  Thanks!

## 2021-06-17 ENCOUNTER — Other Ambulatory Visit: Payer: Medicare Other

## 2021-06-17 ENCOUNTER — Other Ambulatory Visit: Payer: Self-pay

## 2021-06-17 ENCOUNTER — Other Ambulatory Visit: Payer: Self-pay | Admitting: Family Medicine

## 2021-06-17 DIAGNOSIS — I509 Heart failure, unspecified: Secondary | ICD-10-CM

## 2021-06-17 NOTE — Telephone Encounter (Signed)
Estill Bamberg given verbal order to draw BMP and informed that we would discuss Potassium after results.

## 2021-06-17 NOTE — Telephone Encounter (Signed)
Verified w/ son & [facility] nurse that the [facility] is the correct labwork that is being ordered. Order placed.

## 2021-06-17 NOTE — Telephone Encounter (Signed)
I amd glad her legs are better, yes put in order for them to draw bmp and we will decide on potassium from there

## 2021-06-17 NOTE — Addendum Note (Signed)
Addended by: Antonietta Barcelona D on: 06/17/2021 10:13 AM   Modules accepted: Orders

## 2021-06-20 ENCOUNTER — Telehealth: Payer: Self-pay | Admitting: Family Medicine

## 2021-06-20 ENCOUNTER — Telehealth: Payer: Self-pay | Admitting: *Deleted

## 2021-06-20 LAB — BMP8+EGFR
BUN/Creatinine Ratio: 38 — ABNORMAL HIGH (ref 12–28)
BUN: 72 mg/dL — ABNORMAL HIGH (ref 8–27)
Creatinine, Ser: 1.9 mg/dL — ABNORMAL HIGH (ref 0.57–1.00)
Glucose: 304 mg/dL — ABNORMAL HIGH (ref 65–99)
eGFR: 26 mL/min/{1.73_m2} — ABNORMAL LOW (ref >59)

## 2021-06-20 LAB — BRAIN NATRIURETIC PEPTIDE: BNP: 168.5 pg/mL — ABNORMAL HIGH (ref 0.0–100.0)

## 2021-06-20 MED ORDER — NICOTINE 7 MG/24HR TD PT24: 7.0000 mg | MEDICATED_PATCH | Freq: Every day | TRANSDERMAL | 0 refills | Status: AC

## 2021-06-20 NOTE — Telephone Encounter (Signed)
Dr. Warrick Parisian,  Please review labs. I will call son. Thanks.

## 2021-06-20 NOTE — Telephone Encounter (Signed)
Sent the low-dose nicotine patch for her

## 2021-06-20 NOTE — Telephone Encounter (Signed)
Vm from [facility] During visit today, even though pt has not smoked in years, after this weekend & visit with son, she feels like she needs a nicotine patch Please advise

## 2021-06-20 NOTE — Telephone Encounter (Signed)
Left message informing Hannah French with [facility]osp[facility] of Nicotine patch being called in

## 2021-06-20 NOTE — Addendum Note (Signed)
Addended by: Caryl Pina on: 06/20/2021 11:58 AM   Modules accepted: Orders

## 2021-06-21 ENCOUNTER — Other Ambulatory Visit: Payer: Self-pay | Admitting: Family Medicine

## 2021-06-21 DIAGNOSIS — I739 Peripheral vascular disease, unspecified: Secondary | ICD-10-CM

## 2021-06-22 ENCOUNTER — Telehealth: Payer: Self-pay

## 2021-06-22 NOTE — Telephone Encounter (Signed)
Informed Hannah French with Hospice [phone]) that labs could not be processed. Pt had BMP and BNP.  Pt is very dehydrated per nurse. States that she will let pts veins heal for a couple of days and then draw again. New order not required per [facility].

## 2021-06-23 ENCOUNTER — Encounter: Payer: Self-pay | Admitting: Family Medicine

## 2021-06-23 ENCOUNTER — Ambulatory Visit (INDEPENDENT_AMBULATORY_CARE_PROVIDER_SITE_OTHER): Payer: Medicare Other | Admitting: Family Medicine

## 2021-06-23 DIAGNOSIS — I5043 Acute on chronic combined systolic (congestive) and diastolic (congestive) heart failure: Secondary | ICD-10-CM

## 2021-06-23 NOTE — Progress Notes (Signed)
Virtual Visit via telephone Note  I connected with Hannah French on 06/23/21 at 0945 by telephone and verified that I am speaking with the correct person using two identifiers. Hannah French is currently located at home and patient are currently with her during visit. The provider, Fransisca Kaufmann Baylyn Sickles, MD is located in their office at time of visit.  Call ended at [phone]  I discussed the limitations, risks, security and privacy concerns of performing an evaluation and management service by telephone and the availability of in person appointments. I also discussed with the patient that there may be a patient responsible charge related to this service. The patient expressed understanding and agreed to proceed.   History and Present Illness: Patient is calling in for hospital f/u.  She was short of breath for 2 weeks prior to hospital visit.  She was admitted on 9/15 and d/c'd on 9/19.  She is still hoarse and still coughing but breathing has been better since d/c.  She is doing 4 breathing treatments per day. She is doing 60 mg per day of torsemide. She is seeing cardiology next week. She tested negative for Covid. She went home with steroids.  She mainly was found to be in CHF. She is still on 4L [place] in her house.   Her blood sugar is elevated with prednisone and she has insulin.   1. Acute on chronic combined systolic and diastolic congestive heart failure [facility])     Outpatient Encounter Medications as of 06/23/2021  Medication Sig   acetaminophen (TYLENOL) 500 MG tablet Take 500 mg by mouth every 6 (six) hours as needed for moderate pain.   albuterol (VENTOLIN HFA) 108 (90 Base) MCG/ACT inhaler Inhale 2 puffs into the lungs every 6 (six) hours as needed for wheezing or shortness of breath.   Cholecalciferol (VITAMIN D) 50 MCG (2000 UT) tablet Take 4,000 Units by mouth daily.    clopidogrel (PLAVIX) 75 MG tablet Take 1 tablet (75 mg total) by mouth daily.    dextromethorphan-guaiFENesin (MUCINEX DM) 30-600 MG 12hr tablet Take 2 tablets by mouth 2 (two) times daily as needed for cough.   empagliflozin (JARDIANCE) 10 MG TABS tablet Take 10 mg by mouth daily.   famotidine (PEPCID) 20 MG tablet Take 20 mg by mouth at bedtime.   famotidine (PEPCID) 20 MG tablet Take by mouth.   Fluticasone-Umeclidin-Vilant (TRELEGY ELLIPTA) 100-62.5-25 MCG/INH AEPB Inhale 1 puff into the lungs daily. (Patient not taking: Reported on 06/09/2021)   Glucose Blood (BLOOD GLUCOSE TEST STRIPS) STRP 1 strip by In Vitro route 2 (two) times daily.   insulin aspart (NOVOLOG) 100 UNIT/ML injection Inject 0-9 Units into the skin 3 (three) times daily with meals. Sliding Scale CBG 70 - 120: 0 units  CBG 121 - 150: 1 unit  CBG 151 - 200: 2 units  CBG 201 - 250: 3 units  CBG 251 - 300: 5 units  CBG 301 - 350: 7 units  CBG 351 - 400: 9 units  CBG > 400: give 9 units and call your MD for additional instructions   insulin degludec (TRESIBA FLEXTOUCH) 100 UNIT/ML FlexTouch Pen Inject 7 Units into the skin daily.   levalbuterol (XOPENEX) 0.63 MG/3ML nebulizer solution Inhale 0.63 mg into the lungs every 8 (eight) hours as needed for wheezing or shortness of breath.   LORazepam (ATIVAN) 0.5 MG tablet Take 0.5 mg by mouth every 3 (three) hours as needed.   LORazepam (ATIVAN) 0.5 MG tablet  Take by mouth.   morphine 20 MG/5ML solution [id number]  morphine 31m/ml concentrate Take 5 milligrams PO/SL q 2 hour as needed for pain/dyspnea; 543m=0.25 ml (total sent qty=30 ml)   Naphazoline-Pheniramine (OPCON-A) 0.027-0.315 % SOLN Place 1 drop into both eyes daily as needed (Burning and itching eyes).   nicotine (NICODERM CQ - DOSED IN MG/24 HR) 7 mg/24hr patch Place 1 patch (7 mg total) onto the skin daily.   nitroGLYCERIN (NITRODUR - DOSED IN MG/24 HR) 0.2 mg/hr patch APPLY 1 PATCH TO LEFT FOOT DAILY.   nitroGLYCERIN (NITROSTAT) 0.3 MG SL tablet Place 1 tablet (0.3 mg total) under the tongue every 5  (five) minutes as needed for chest pain.   omeprazole (PRILOSEC) 20 MG capsule Take 20 mg by mouth every morning.   omeprazole (PRILOSEC) 20 MG capsule Take by mouth.   ondansetron (ZOFRAN) 4 MG tablet Take 1 tablet (4 mg total) by mouth every 4 (four) hours as needed for nausea or vomiting.   OXYGEN Inhale 5 Doses into the lungs continuous. 5 liters n/c   pentoxifylline (TRENTAL) 400 MG CR tablet Take 1 tablet (400 mg total) by mouth 2 (two) times daily with a meal.   predniSONE (DELTASONE) 20 MG tablet Take 2 PO QAM x7days, 1 PO QAM x7days, then stop   rOPINIRole (REQUIP) 0.25 MG tablet Take 1 tablet (0.25 mg total) by mouth at bedtime as needed (restless leg syndrome).   sennosides-docusate sodium (SENOKOT-S) 8.6-50 MG tablet Take 1 tablet by mouth daily.   Torsemide 40 MG TABS Take 60 mg by mouth daily.   No facility-administered encounter medications on file as of 06/23/2021.    Review of Systems  Constitutional:  Negative for chills and fever.  Eyes:  Negative for visual disturbance.  Respiratory:  Negative for chest tightness and shortness of breath.   Cardiovascular:  Negative for chest pain and leg swelling.  Musculoskeletal:  Negative for back pain and gait problem.  Skin:  Negative for rash.  Neurological:  Negative for light-headedness and headaches.  Psychiatric/Behavioral:  Negative for agitation and behavioral problems.   All other systems reviewed and are negative.  Observations/Objective: Patient sounds comfortable and in no acute distress  Assessment and Plan: Problem List Items Addressed This Visit   None Visit Diagnoses     Acute on chronic combined systolic and diastolic congestive heart failure [place]   -  Primary   Relevant Orders   CBC with Differential/Platelet   CMP14+EGFR       Sounds like she is doing better on the higher dose of diuretic, continue with this.  Blood sugars likely elevated because of steroids, recommended to increase her Tresiba to 12  units for now and keep doing sliding scale with Humalog. Follow up plan: Return if symptoms worsen or fail to improve.     I discussed the assessment and treatment plan with the patient. The patient was provided an opportunity to ask questions and all were answered. The patient agreed with the plan and demonstrated an understanding of the instructions.   The patient was advised to call back or seek an in-person evaluation if the symptoms worsen or if the condition fails to improve as anticipated.  The above assessment and management plan was discussed with the patient. The patient verbalized understanding of and has agreed to the management plan. Patient is aware to call the clinic if symptoms persist or worsen. Patient is aware when to return to the clinic for a follow-up visit.  Patient educated on when it is appropriate to go to the emergency department.    I provided 13 minutes of non-face-to-face time during this encounter.    Worthy Rancher, MD

## 2021-06-27 ENCOUNTER — Encounter (HOSPITAL_COMMUNITY): Payer: Self-pay

## 2021-06-27 ENCOUNTER — Ambulatory Visit (HOSPITAL_COMMUNITY)
Admission: RE | Admit: 2021-06-27 | Discharge: 2021-06-27 | Disposition: A | Discharge status: Home / Self Care | Payer: Medicare Other | Source: Ambulatory Visit | Attending: Family Medicine | Admitting: Family Medicine

## 2021-06-27 ENCOUNTER — Other Ambulatory Visit: Payer: Self-pay

## 2021-06-27 VITALS — BP 120/60 | HR 95 | Wt 120.8 lb

## 2021-06-27 DIAGNOSIS — I130 Hypertensive heart and chronic kidney disease with heart failure and stage 1 through stage 4 chronic kidney disease, or unspecified chronic kidney disease: Secondary | ICD-10-CM | POA: Insufficient documentation

## 2021-06-27 DIAGNOSIS — Z87891 Personal history of nicotine dependence: Secondary | ICD-10-CM | POA: Insufficient documentation

## 2021-06-27 DIAGNOSIS — I5042 Chronic combined systolic (congestive) and diastolic (congestive) heart failure: Secondary | ICD-10-CM | POA: Insufficient documentation

## 2021-06-27 DIAGNOSIS — Z95820 Peripheral vascular angioplasty status with implants and grafts: Secondary | ICD-10-CM | POA: Diagnosis not present

## 2021-06-27 DIAGNOSIS — Z955 Presence of coronary angioplasty implant and graft: Secondary | ICD-10-CM | POA: Insufficient documentation

## 2021-06-27 DIAGNOSIS — Z9981 Dependence on supplemental oxygen: Secondary | ICD-10-CM | POA: Diagnosis not present

## 2021-06-27 DIAGNOSIS — Z79899 Other long term (current) drug therapy: Secondary | ICD-10-CM | POA: Insufficient documentation

## 2021-06-27 DIAGNOSIS — Z794 Long term (current) use of insulin: Secondary | ICD-10-CM | POA: Diagnosis not present

## 2021-06-27 DIAGNOSIS — R0600 Dyspnea, unspecified: Secondary | ICD-10-CM | POA: Diagnosis not present

## 2021-06-27 DIAGNOSIS — N189 Chronic kidney disease, unspecified: Secondary | ICD-10-CM | POA: Diagnosis not present

## 2021-06-27 DIAGNOSIS — J449 Chronic obstructive pulmonary disease, unspecified: Secondary | ICD-10-CM | POA: Insufficient documentation

## 2021-06-27 DIAGNOSIS — Z7951 Long term (current) use of inhaled steroids: Secondary | ICD-10-CM | POA: Insufficient documentation

## 2021-06-27 DIAGNOSIS — Z7902 Long term (current) use of antithrombotics/antiplatelets: Secondary | ICD-10-CM | POA: Insufficient documentation

## 2021-06-27 DIAGNOSIS — I771 Stricture of artery: Secondary | ICD-10-CM | POA: Diagnosis not present

## 2021-06-27 DIAGNOSIS — I2510 Atherosclerotic heart disease of native coronary artery without angina pectoris: Secondary | ICD-10-CM | POA: Diagnosis not present

## 2021-06-27 DIAGNOSIS — K219 Gastro-esophageal reflux disease without esophagitis: Secondary | ICD-10-CM | POA: Insufficient documentation

## 2021-06-27 DIAGNOSIS — Z66 Do not resuscitate: Secondary | ICD-10-CM | POA: Insufficient documentation

## 2021-06-27 DIAGNOSIS — Z720 Tobacco use: Secondary | ICD-10-CM

## 2021-06-27 DIAGNOSIS — E1151 Type 2 diabetes mellitus with diabetic peripheral angiopathy without gangrene: Secondary | ICD-10-CM | POA: Diagnosis not present

## 2021-06-27 DIAGNOSIS — E1122 Type 2 diabetes mellitus with diabetic chronic kidney disease: Secondary | ICD-10-CM | POA: Diagnosis not present

## 2021-06-27 LAB — BASIC METABOLIC PANEL WITH GFR
Anion gap: 11 (ref 5–15)
BUN: 50 mg/dL — ABNORMAL HIGH (ref 8–23)
CO2: 30 mmol/L (ref 22–32)
Calcium: 8.9 mg/dL (ref 8.9–10.3)
Chloride: 98 mmol/L (ref 98–111)
Creatinine, Ser: 1.9 mg/dL — ABNORMAL HIGH (ref 0.44–1.00)
GFR, Estimated: 26 mL/min — ABNORMAL LOW
Glucose, Bld: 355 mg/dL — ABNORMAL HIGH (ref 70–99)
Potassium: 4.7 mmol/L (ref 3.5–5.1)
Sodium: 139 mmol/L (ref 135–145)

## 2021-06-27 NOTE — Progress Notes (Signed)
Advanced Heart Failure Clinic Note   PCP: Dettinger, Fransisca Kaufmann, MD HF Cardiologist: Dr. Haroldine Laws Pulmonary: Dr Arma Heading of [facility]   HPI: Hannah French is a pleasant 80 y.o. female with history of chronic HFrEF/NICM, CAD, DES to mid LAD, hypertension, DMII,  PVD, GERD, CKD, severe COPD, former  tobacco abuse., and s/p stent to distal aorital 12/2019, bilateral common iliac artery.    Saw Dr. Burt Knack 12/19 due to SOB. EKG showed new LBBB.An echo was done which showed new onset LV dysfunction EF 30-35%.  Underwent R/L cath which showed nonobstructive CAD    Her medical therapy has been limited by low BP and dizziness (beta blocker reduced, losartan stopped). Corlanor was started for elevated HR and soft BP but not yet approved, F/u echo 04/28/19 showed EF 25-30%, diffuse HK, Grade II DD,  mildly dilated LA, mild MR.    Underwent stenting of bilateral common iliac artery stent placement extending into the distal aorta on 12/31/19   Seen in HF Clinic c/o CP -> cath 1/22 showed 75% lesion LAD -> PCI mLAD.   Echo 1/22 EF 40-45%.  Admitted on 04/06/2021 with a/c respiratory failure with hypoxia secondary to a/c CHF with COPD exacerbation and superimposed pneumonia. Required mechanical ventilation and briefly BP support with NE. Course complicated by AKI, creatinine peaked at 2.37 with baseline of 1.   Diursed with IV lasix and later po lasix. Echo during admit with EF 25-30%, mildly reduced RV function, + dyssynchrony. GDMT limited by hypotension. Cardiomyopathy felt to be possibly due to LBBB. Was seen by Dr. Quentin Ore on 08/16, QRS not wide enough for CRT and felt to be high risk for any procedures.  Saw Dr Haroldine Laws 05/16/21. Volume overloaded. Switched from lasix to torsemide 40 mg daily.   She was seen in the HF clinic on 06/06/21. Reds clip 39%. Torsemide was increased to 60 mg daily . Weight at that time 116 pounds.   Admitted 06/09/21 with increased shortness of breath in the  setting of COPD exacerbation and Acute/Chronic HFrEF . Given solumedrol and diuresed with IV lasix. Placed on Bipap with eventual wean to 4 liters [place]. Discharged on prednisone and torsemide 60 mg daily. [facility] care at discharge.  Discharged on 06/13/21.   Today she returns for HF follow up with her daughter. She was seen 3 weeks ago and torsemide was increased to 60 mg daily.Complaining of fatigue today. SOB with exertion.  Using 3-4 liters oxygen. Denies PND/Orthopnea. Appetite improved with steroids.  No fever or chills. Weight at home trending up.  Taking all medications. Using ativan 2 times a day and has not using morphine. Her daughter tells me she is reluctant to use morphine. Followed by [facility] for the last 3 months. Has an aide 3x per week and a nurse weekly.   ROS: All systems negative except as listed in HPI, PMH and Problem List.  SH:  Social History   Socioeconomic History   Marital status: Divorced    Spouse name: Not on file   Number of children: 2   Years of education: Some College    Highest education level: Some college, no degree  Occupational History   Occupation: Retired  Tobacco Use   Smoking status: Former    Packs/day: 1.00    Years: 50.00    Pack years: 50.00    Types: Cigarettes   Smokeless tobacco: Never   Tobacco comments:    still smoking as of  03/01/21  Vaping Use   Vaping Use: Never used  Substance and Sexual Activity   Alcohol use: No   Drug use: No   Sexual activity: Not Currently  Other Topics Concern   Not on file  Social History Narrative   Not on file   Social Determinants of Health   Financial Resource Strain: Not on file  Food Insecurity: No Food Insecurity   Worried About Running Out of Food in the Last Year: Never true   Ran Out of Food in the Last Year: Never true  Transportation Needs: No Transportation Needs   Lack of Transportation (Medical): No   Lack of Transportation  (Non-Medical): No  Physical Activity: Not on file  Stress: Not on file  Social Connections: Not on file  Intimate Partner Violence: Not on file   FH:  Family History  Problem Relation Age of Onset   Heart failure Mother    Hypertension Sister    Heart attack Brother    Heart attack Maternal Uncle    Heart disease Sister    COPD Brother    Congestive Heart Failure Brother    Congestive Heart Failure Daughter    Rheum arthritis Son    Ovarian cancer Sister    Coronary artery disease Neg Hx        Early    Past Medical History:  Diagnosis Date   Abdominal bruit    Bladder cancer ([place])    CAD (coronary artery disease)    a. nonobstructive by cath 08/2018.   Carotid bruit    Chronic combined systolic and diastolic CHF (congestive heart failure) (HCC)    CKD (chronic kidney disease), stage II    Congestive heart failure (CHF) (HCC)    Diabetes mellitus    Type II   Dyslipidemia    Heart murmur    Mild pulmonary hypertension (HCC)    Mitral valve prolapse    a. not seen on most recent echoes. Mild MR now.   NICM (nonischemic cardiomyopathy) ([place])    Osteoporosis    Pancreatitis, acute    Pseudogout    PVD (peripheral vascular disease) ([place])    a. 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   Sinus tachycardia    Tobacco abuse     Current Outpatient Medications  Medication Sig Dispense Refill   acetaminophen (TYLENOL) 500 MG tablet Take 500 mg by mouth every 6 (six) hours as needed for moderate pain.     albuterol (VENTOLIN HFA) 108 (90 Base) MCG/ACT inhaler Inhale 2 puffs into the lungs every 6 (six) hours as needed for wheezing or shortness of breath. 18 g 5   Cholecalciferol (VITAMIN D) 50 MCG (2000 UT) tablet Take 4,000 Units by mouth daily.      clopidogrel (PLAVIX) 75 MG tablet Take 1 tablet (75 mg total) by mouth daily. 90 tablet 2   dextromethorphan-guaiFENesin (MUCINEX DM) 30-600 MG 12hr tablet Take 2 tablets by mouth  2 (two) times daily as needed for cough. 120 tablet 2   Fluticasone-Umeclidin-Vilant (TRELEGY ELLIPTA) 100-62.5-25 MCG/INH AEPB Inhale 1 puff into the lungs daily. 60 each 5   furosemide (LASIX) 20 MG tablet Take 2 tablets daily. You may take an extra dose for increase weight gain orswelling 180 tablet 3   Glucose Blood (BLOOD GLUCOSE TEST STRIPS) STRP 1 strip by In Vitro route 2 (two) times daily. 100 strip 3   insulin aspart (NOVOLOG) 100 UNIT/ML injection Inject 0-9 Units into the skin 3 (  three) times daily with meals. Sliding Scale CBG 70 - 120: 0 units  CBG 121 - 150: 1 unit  CBG 151 - 200: 2 units  CBG 201 - 250: 3 units  CBG 251 - 300: 5 units  CBG 301 - 350: 7 units  CBG 351 - 400: 9 units  CBG > 400: give 9 units and call your MD for additional instructions 10 mL 1   insulin degludec (TRESIBA FLEXTOUCH) 100 UNIT/ML FlexTouch Pen Inject 7 Units into the skin daily. 9 mL 3   levalbuterol (XOPENEX) 0.63 MG/3ML nebulizer solution Inhale into the lungs.     LORazepam (ATIVAN) 0.5 MG tablet Take 0.5 mg by mouth every 3 (three) hours as needed.     Morphine Sulfate (MORPHINE CONCENTRATE) 10 mg / 0.5 ml concentrated solution 10 mg.     Naphazoline-Pheniramine (OPCON-A) 0.027-0.315 % SOLN Place 1 drop into both eyes daily as needed (Burning and itching eyes).     nitroGLYCERIN (NITRODUR - DOSED IN MG/24 HR) 0.2 mg/hr patch Place 1 patch (0.2 mg total) onto the skin daily. 30 patch 12   nitroGLYCERIN (NITROSTAT) 0.3 MG SL tablet Place 1 tablet (0.3 mg total) under the tongue every 5 (five) minutes as needed for chest pain. 100 tablet 3   ondansetron (ZOFRAN) 4 MG tablet Take 1 tablet (4 mg total) by mouth every 4 (four) hours as needed for nausea or vomiting. 90 tablet 2   OXYGEN Inhale 2 Doses into the lungs continuous. 2 liters n/c     pentoxifylline (TRENTAL) 400 MG CR tablet TAKE  (1)  TABLET  THREE TIMES DAILY WITH MEALS. 90 tablet 3   potassium chloride SA (KLOR-CON) 20 MEQ tablet Take 2  tablets (40 mEq total) by mouth as directed. Only when you take Metolazone 10 tablet 0   sennosides-docusate sodium (SENOKOT-S) 8.6-50 MG tablet Take 1 tablet by mouth daily. 30 tablet 5   No current facility-administered medications for this encounter.   BP 120/60   Pulse 95   Wt 54.8 kg (120 lb 12.8 oz)   SpO2 100%   BMI 24.40 kg/m   Wt Readings from Last 3 Encounters:  06/27/21 54.8 kg (120 lb 12.8 oz)  06/13/21 55.8 kg (123 lb 0.3 oz)  06/06/21 52.7 kg (116 lb 3.2 oz)  Reds Clip 36%  PHYSICAL EXAM: General:  Arrived in a wheelchair. Appears chronically ill.  Neck: supple. JVP 7-8 . Carotids 2+ bilat; no bruits. No lymphadenopathy or thryomegaly appreciated. Cor: PMI nondisplaced. Regular rate & rhythm. No rubs, gallops or murmurs. Lungs: clear/no wheeze on 4 liters [place].  Abdomen: soft, nontender, nondistended. No hepatosplenomegaly. No bruits or masses. Good bowel sounds. Extremities: no cyanosis, clubbing, rash, edema Neuro: alert & orientedx3, cranial nerves grossly intact. moves all 4 extremities w/o difficulty. Affect pleasant  ASSESSMENT & PLAN:   1. Chronic Systolic HF - Due to NICM (? LBBB but not very wide)  - Echo 12/19 EF 30-35% with diffuse hypokinesis. Cath 12/19 nonobstructive CAD. - Echo 8/20 EF 25-30% with Grade II DD. - Echo 1/22 EF 40-45%  - Echo 7/22 EF 25-30% - . NYHA III multifactorial with severe COPD.  Volume status stable. Reds Clip 36%.  - Continue torsemide to 60 mg daily and instructed to take an extra 20 mg torsemide for increased dyspnea or if she feels like she has extra fluid. Check BMET today.  - Continue Jardiance 10 mg daily. - No room GDMT with hypotension/dizziness and with transition to  comfort.  - Continue Hospice support.  2. CAD - 1/19/22S/P DES mid LAD lesion.  - no chest pain.  - Continue Rivaroxaban + Clopidogrel 75mg  daily for 12 months. If there are significant bleeding complications, can stop clopidogrel after 6 months. -  Intolerant of statins. On Repatha.   3. 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  - Unable to take gabapentin. - On Xarelto.  - No bleeding issues.    4. COPD GOLD III - PFTs 9/20 with severe COPD. -No longer smoking.    5. DM2 - Followed by PCP. - On Jardiance.  6. GOC -She has GOLD DNR/DNI form at home  -[facility] by [facility]. We discussed purpose of ativan and morphine and that she should use these as needed for increased shortness of breath. Also can take extra torsemide if needed.  [name] and her daughter understand but are tearful given she is end stage. Provided with support and will offer suggestions to Hospice if needed for fluid management.   Follow up 2-3 months   Darrick Grinder, NP  1:39 PM

## 2021-06-27 NOTE — Progress Notes (Signed)
ReDS Vest / Clip - 06/27/21 1400       ReDS Vest / Clip   Station Marker A    Ruler Value 25    ReDS Value Range Moderate volume overload    ReDS Actual Value 36

## 2021-06-27 NOTE — Patient Instructions (Signed)
Labs were done today, if any labs are abnormal the clinic will call you  Your physician recommends that you schedule a follow-up appointment in: 8 weeks  At the [facility], you and your health needs are our priority. As part of our continuing mission to provide you with exceptional heart care, we have created designated Provider Care Teams. These Care Teams include your primary Cardiologist (physician) and Advanced Practice Providers (APPs- Physician Assistants and Nurse Practitioners) who all work together to provide you with the care you need, when you need it.   You may see any of the following providers on your designated Care Team at your next follow up: Dr Glori Bickers Dr Loralie Champagne Dr Patrice Paradise, NP Lyda Jester, [place] Ginnie Smart Audry Riles, PharmD   Please be sure to bring in all your medications bottles to every appointment.    If you have any questions or concerns before your next appointment please send [place] a message through [place] or call our office at [phone].    TO LEAVE A MESSAGE FOR THE NURSE SELECT OPTION 2, PLEASE LEAVE A MESSAGE INCLUDING: YOUR NAME DATE OF BIRTH CALL BACK NUMBER REASON FOR CALL**this is important as we prioritize the call backs  YOU WILL RECEIVE A CALL BACK THE SAME DAY AS LONG AS YOU CALL BEFORE 4:00 PM

## 2021-07-01 NOTE — Telephone Encounter (Signed)
Pt has been seen in office since this telephone encounter, will close. 

## 2021-07-05 ENCOUNTER — Ambulatory Visit: Payer: Medicare Other | Admitting: Orthopedic Surgery

## 2021-07-07 ENCOUNTER — Other Ambulatory Visit: Payer: Self-pay | Admitting: Family Medicine

## 2021-07-08 DIAGNOSIS — D485 Neoplasm of uncertain behavior of skin: Secondary | ICD-10-CM | POA: Diagnosis not present

## 2021-07-08 DIAGNOSIS — C44722 Squamous cell carcinoma of skin of right lower limb, including hip: Secondary | ICD-10-CM | POA: Diagnosis not present

## 2021-07-08 DIAGNOSIS — L821 Other seborrheic keratosis: Secondary | ICD-10-CM | POA: Diagnosis not present

## 2021-07-08 DIAGNOSIS — Z08 Encounter for follow-up examination after completed treatment for malignant neoplasm: Secondary | ICD-10-CM | POA: Diagnosis not present

## 2021-07-08 DIAGNOSIS — L570 Actinic keratosis: Secondary | ICD-10-CM | POA: Diagnosis not present

## 2021-07-08 DIAGNOSIS — D229 Melanocytic nevi, unspecified: Secondary | ICD-10-CM | POA: Diagnosis not present

## 2021-07-08 DIAGNOSIS — Z85828 Personal history of other malignant neoplasm of skin: Secondary | ICD-10-CM | POA: Diagnosis not present

## 2021-07-11 ENCOUNTER — Telehealth: Payer: Self-pay | Admitting: Family Medicine

## 2021-07-11 ENCOUNTER — Ambulatory Visit: Admitting: Internal Medicine

## 2021-07-11 DIAGNOSIS — I5043 Acute on chronic combined systolic (congestive) and diastolic (congestive) heart failure: Secondary | ICD-10-CM

## 2021-07-11 MED ORDER — ROPINIROLE HCL 0.5 MG PO TABS: 0.5000 mg | ORAL_TABLET | Freq: Every day | ORAL | 3 refills | Status: AC

## 2021-07-11 NOTE — Telephone Encounter (Signed)
I sent the higher dose of the Requip, they can double up on what they have currently Orders been placed for BMP

## 2021-07-11 NOTE — Telephone Encounter (Signed)
Sliding scale reviewed  Under 150 0 units  151-200 2 units  201-250 4 units  251-300 6 units  301-350 8 units  350+ 10 units  Future BMP ordered. Son will bring pt to lab here because pt like them better than Hospice nurse.   Son also wants to know if pt can increase Requip. Pt states that pain is unbearable day and night. Currently taking one .25mg  qhs.

## 2021-07-11 NOTE — Telephone Encounter (Signed)
Son Darnell Level informed and understood.

## 2021-07-12 ENCOUNTER — Other Ambulatory Visit: Payer: Medicare Other

## 2021-07-12 ENCOUNTER — Telehealth: Payer: Self-pay | Admitting: Family Medicine

## 2021-07-12 ENCOUNTER — Other Ambulatory Visit: Payer: Self-pay

## 2021-07-12 DIAGNOSIS — I5043 Acute on chronic combined systolic (congestive) and diastolic (congestive) heart failure: Secondary | ICD-10-CM

## 2021-07-12 MED ORDER — INFLUENZA VAC A&B SA ADJ QUAD 0.5 ML IM PRSY: 0.5000 mL | PREFILLED_SYRINGE | Freq: Once | INTRAMUSCULAR | 0 refills | Status: AC

## 2021-07-12 NOTE — Telephone Encounter (Signed)
[facility] called and requested and Rx for Flu shot sent to [place] for patient. Rx sent to pharmacy and [facility] with Hospice is aware.

## 2021-07-13 ENCOUNTER — Other Ambulatory Visit: Payer: Self-pay

## 2021-07-13 ENCOUNTER — Encounter: Payer: Self-pay | Admitting: Family Medicine

## 2021-07-13 ENCOUNTER — Telehealth: Payer: Self-pay | Admitting: Family Medicine

## 2021-07-13 DIAGNOSIS — R531 Weakness: Secondary | ICD-10-CM | POA: Diagnosis not present

## 2021-07-13 DIAGNOSIS — J449 Chronic obstructive pulmonary disease, unspecified: Secondary | ICD-10-CM

## 2021-07-13 DIAGNOSIS — J9600 Acute respiratory failure, unspecified whether with hypoxia or hypercapnia: Secondary | ICD-10-CM | POA: Diagnosis not present

## 2021-07-13 DIAGNOSIS — I509 Heart failure, unspecified: Secondary | ICD-10-CM

## 2021-07-13 DIAGNOSIS — I5022 Chronic systolic (congestive) heart failure: Secondary | ICD-10-CM

## 2021-07-13 DIAGNOSIS — J9690 Respiratory failure, unspecified, unspecified whether with hypoxia or hypercapnia: Secondary | ICD-10-CM | POA: Diagnosis not present

## 2021-07-13 LAB — CBC WITH DIFFERENTIAL/PLATELET
Basophils Absolute: 0 10*3/uL (ref 0.0–0.2)
Basos: 0 %
EOS (ABSOLUTE): 0 10*3/uL (ref 0.0–0.4)
Eos: 1 %
Hematocrit: 24.1 % — ABNORMAL LOW (ref 34.0–46.6)
Hemoglobin: 7.9 g/dL — CL (ref 11.1–15.9)
Immature Grans (Abs): 0.1 10*3/uL (ref 0.0–0.1)
Immature Granulocytes: 1 %
Lymphocytes Absolute: 0.9 10*3/uL (ref 0.7–3.1)
Lymphs: 12 %
MCH: 32.4 pg (ref 26.6–33.0)
MCHC: 32.8 g/dL (ref 31.5–35.7)
MCV: 99 fL — ABNORMAL HIGH (ref 79–97)
Monocytes Absolute: 0.5 10*3/uL (ref 0.1–0.9)
Monocytes: 7 %
NRBC: 1 % — ABNORMAL HIGH (ref 0–0)
Neutrophils Absolute: 6.1 10*3/uL (ref 1.4–7.0)
Neutrophils: 79 %
Platelets: 349 10*3/uL (ref 150–450)
RBC: 2.44 x10E6/uL — CL (ref 3.77–5.28)
RDW: 19.6 % — ABNORMAL HIGH (ref 11.7–15.4)
WBC: 7.6 10*3/uL (ref 3.4–10.8)

## 2021-07-13 LAB — BMP8+EGFR
BUN/Creatinine Ratio: 28 (ref 12–28)
BUN: 62 mg/dL — ABNORMAL HIGH (ref 8–27)
CO2: 24 mmol/L (ref 20–29)
Calcium: 9.1 mg/dL (ref 8.7–10.3)
Chloride: 95 mmol/L — ABNORMAL LOW (ref 96–106)
Creatinine, Ser: 2.24 mg/dL — ABNORMAL HIGH (ref 0.57–1.00)
Glucose: 220 mg/dL — ABNORMAL HIGH (ref 70–99)
Potassium: 4.2 mmol/L (ref 3.5–5.2)
Sodium: 139 mmol/L (ref 134–144)
eGFR: 22 mL/min/{1.73_m2} — ABNORMAL LOW (ref >59)

## 2021-07-13 NOTE — Telephone Encounter (Signed)
Tried calling. Phone would not ring x2. Will try back in the morning.

## 2021-07-14 NOTE — Telephone Encounter (Signed)
Pt made aware that BNP is not needed per Dettinger.  Son will still like to bring pt to check the lab. He states that pt has anxiety and becomes more SOB if she does not understand the worsening lab tests.  Future orders placed. Pt will come today.

## 2021-07-19 ENCOUNTER — Other Ambulatory Visit: Payer: Self-pay | Admitting: Family Medicine

## 2021-07-19 DIAGNOSIS — I739 Peripheral vascular disease, unspecified: Secondary | ICD-10-CM

## 2021-07-20 ENCOUNTER — Encounter: Payer: Self-pay | Admitting: Family Medicine

## 2021-07-22 ENCOUNTER — Telehealth: Payer: Self-pay | Admitting: Family Medicine

## 2021-07-22 NOTE — Telephone Encounter (Signed)
Daughter is calling back because she never heard from Mila Doce.

## 2021-07-22 NOTE — Telephone Encounter (Signed)
Message already in 

## 2021-07-25 ENCOUNTER — Ambulatory Visit: Payer: Medicare Other | Admitting: Gastroenterology

## 2021-07-26 ENCOUNTER — Telehealth: Payer: Self-pay | Admitting: Pharmacist

## 2021-07-26 NOTE — Telephone Encounter (Signed)
They need to make an appt if they need rx assistance  Will need most recent financials  I would clarify that patient is not hospice or palliative--I thought she was and I didn't know if we were able to enroll etc   [phone] daughter's number

## 2021-07-29 ENCOUNTER — Telehealth: Payer: Self-pay

## 2021-07-29 NOTE — Telephone Encounter (Signed)
Hannah French   Can you take a look at note scanned in media yesterday about hospice? Wasn't sure if that would effect scheduling.   Thank you   Noreene Larsson, [place], [place], [place] [address] Direct Dial: [phone] [email or web address] Website: [email or web address]

## 2021-07-29 NOTE — Chronic Care Management (AMB) (Signed)
  Chronic Care Management   Note  07/29/2021 Name: Hildred Pharo MRN: [id number] DOB: 10-Apr-1941  Viona Hosking is a 80 y.o. year old female who is a primary care patient of Dettinger, Fransisca Kaufmann, MD. Leaner Morici is currently enrolled in care management services. An additional referral for Pharm D  was placed.   Follow up plan: Unsuccessful telephone outreach attempt made. A HIPAA compliant phone message was left for the patient providing contact information and requesting a return call.  The care management team will reach out to the patient again over the next 5 days.  If patient returns call to provider office, please advise to call [facility]  at [place], [place], [place], [place] [address] Direct Dial: [phone] [email or web address] Website: [email or web address]

## 2021-08-01 NOTE — Chronic Care Management (AMB) (Signed)
  Chronic Care Management   Note  08/01/2021 Name: Hannah French MRN: [id number] DOB: [birth year]  Hannah French is a 80 y.o. year old female who is a primary care patient of Dettinger, Fransisca Kaufmann, MD. Hannah French is currently enrolled in care management services. An additional referral for pharm d  was placed.   Follow up plan: Unsuccessful telephone outreach attempt made. A HIPAA compliant phone message was left for the patient providing contact information and requesting a return call.  The care management team will reach out to the patient again over the next 7 days.  If patient returns call to provider office, please advise to call [facility]  at [place], [place], [place], [place] [address] Direct Dial: [phone] [email or web address] Website: [email or web address]

## 2021-08-05 ENCOUNTER — Telehealth: Payer: Self-pay | Admitting: Pharmacist

## 2021-08-05 NOTE — Telephone Encounter (Signed)
Please let daughter know her mom's medicine has arrived for patient assistance: [phone]  Patient is palliative/hospice  TRESIBA/NOVOLOG PATIENT ASSISTANCE SHIPMENT

## 2021-08-05 NOTE — Telephone Encounter (Signed)
Patient daughter aware and verbalized understanding.

## 2021-08-10 ENCOUNTER — Telehealth: Payer: Self-pay | Admitting: Family Medicine

## 2021-08-10 IMAGING — MG MM DIGITAL SCREENING BILAT W/ CAD
4 series · 4 of 4 positions shown · non-contrast
Comparison: Previous exam(s).

CLINICAL DATA: Screening.

EXAM:
DIGITAL SCREENING BILATERAL MAMMOGRAM WITH CAD

[R CC]
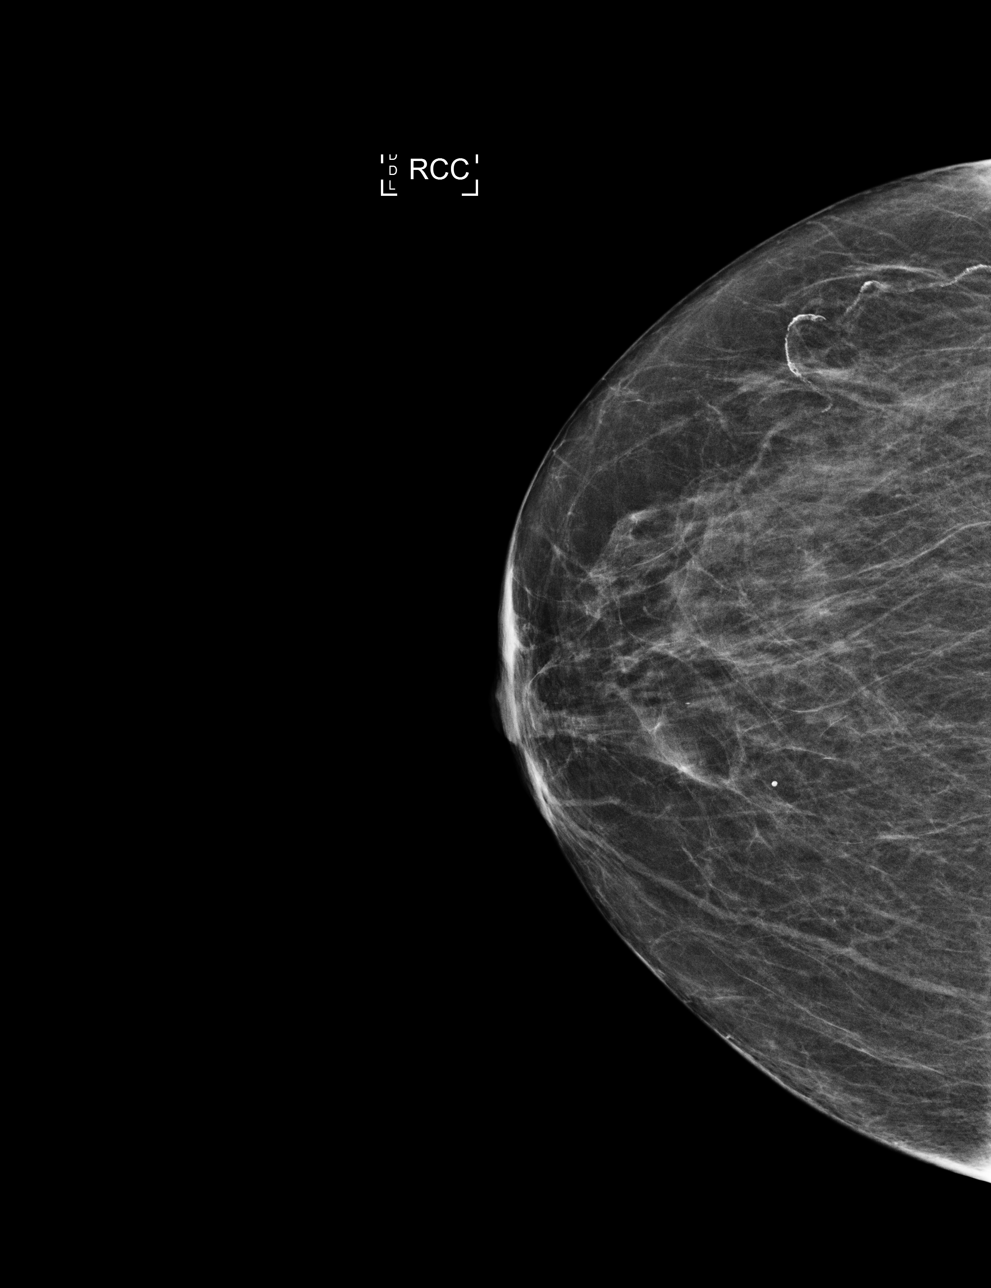

[L MLO]
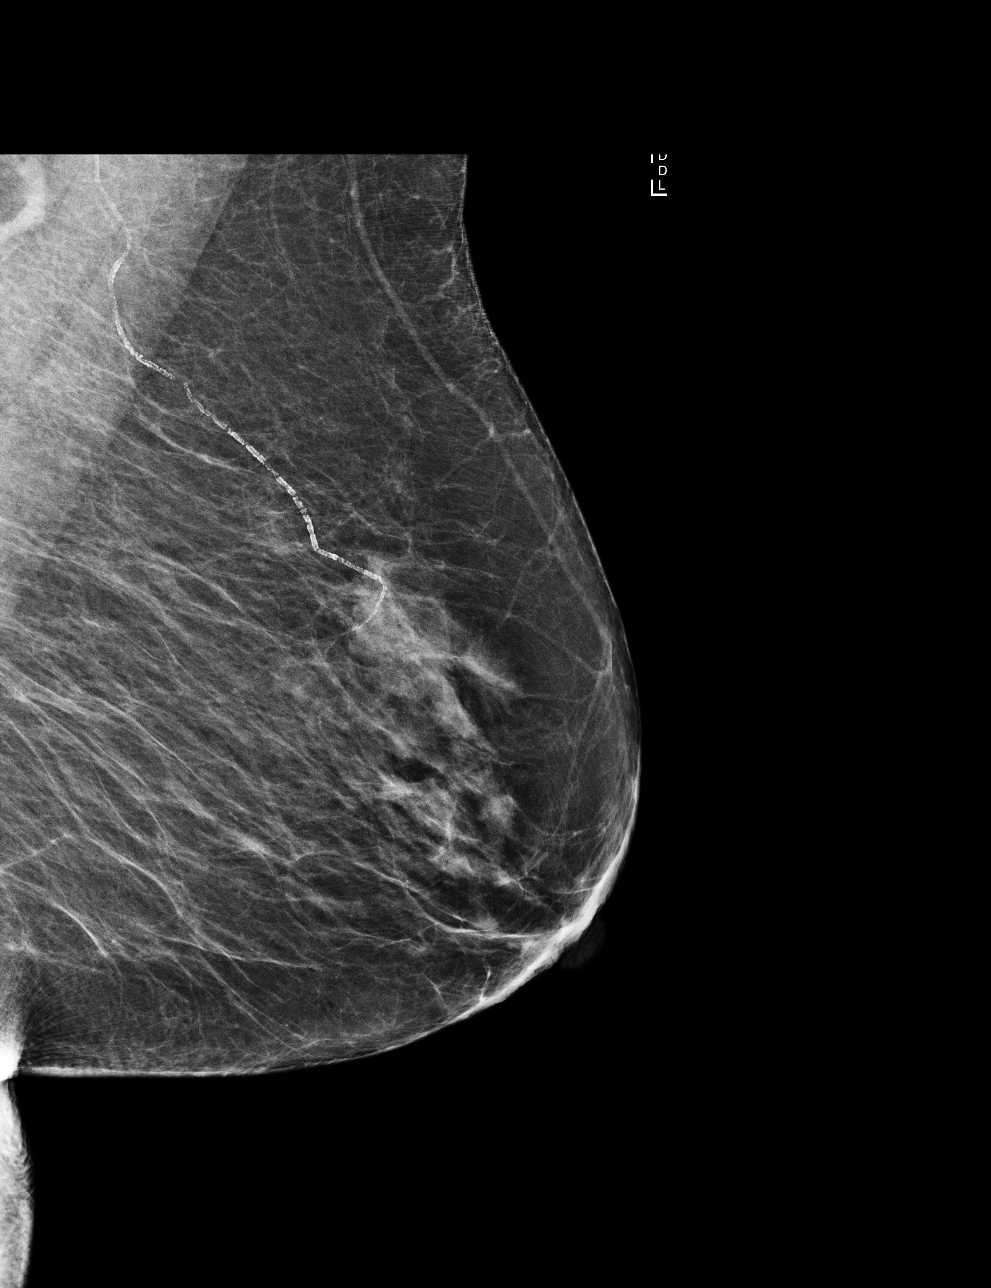

[R MLO]
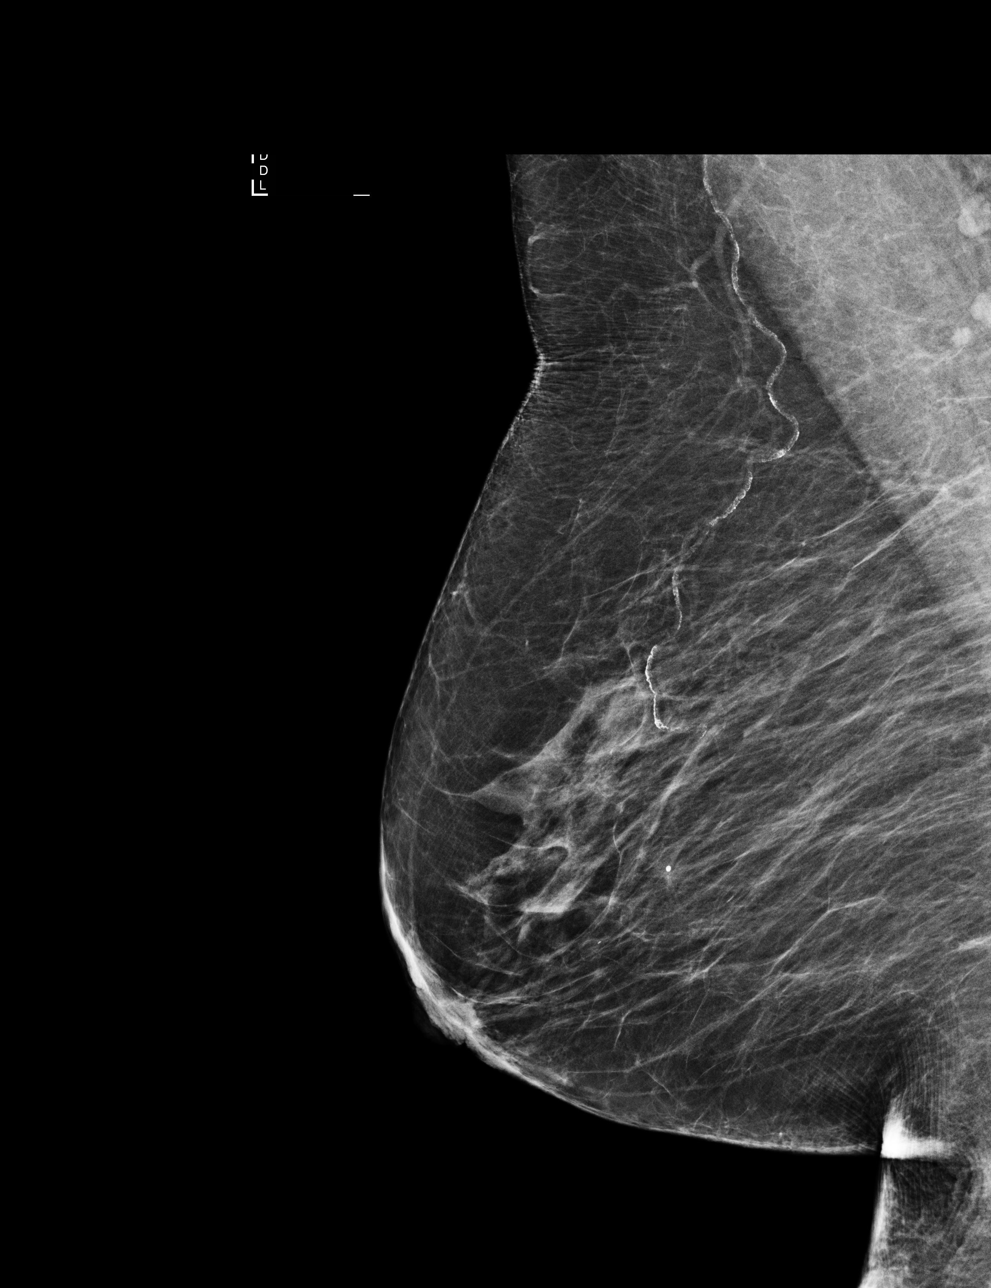

[L CC]
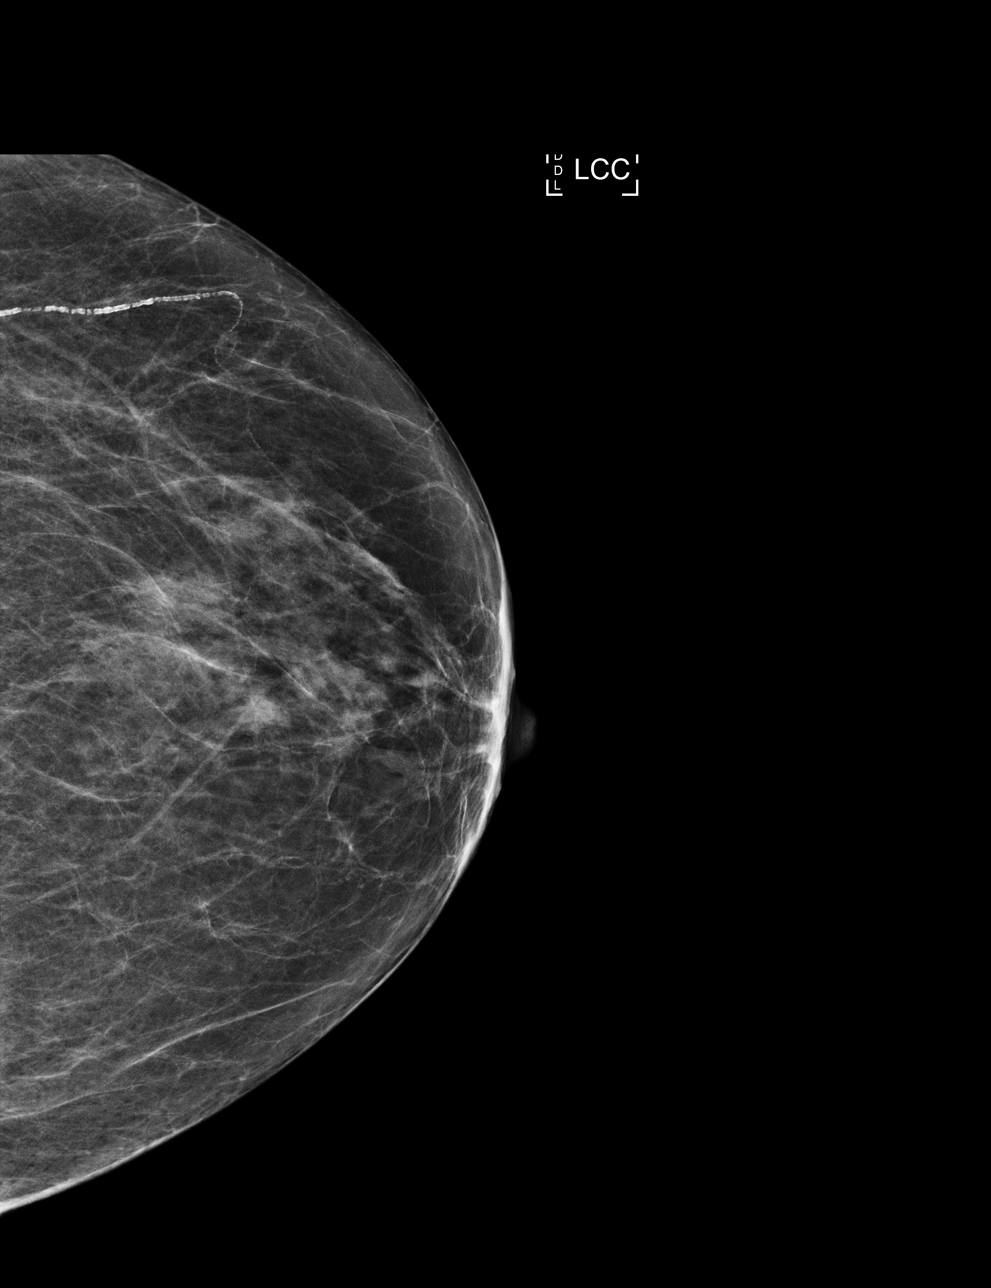

[4 of 4 positions shown; findings below may reference images not displayed]

ACR Breast Density Category b: There are scattered areas of
fibroglandular density.
FINDINGS: There are no findings suspicious for malignancy. Images were
processed with CAD.
IMPRESSION: No mammographic evidence of malignancy. A result letter of this
screening mammogram will be mailed directly to the patient.

RECOMMENDATION:
Screening mammogram in one year. (Code:AS-G-LCT)

BI-RADS CATEGORY  1: Negative.

## 2021-08-10 NOTE — Telephone Encounter (Signed)
Go ahead and place orders for hospice to draw the blood work that she needs.

## 2021-08-11 ENCOUNTER — Other Ambulatory Visit: Payer: Self-pay

## 2021-08-11 ENCOUNTER — Telehealth: Payer: Self-pay | Admitting: Family Medicine

## 2021-08-11 DIAGNOSIS — J449 Chronic obstructive pulmonary disease, unspecified: Secondary | ICD-10-CM

## 2021-08-13 DIAGNOSIS — R531 Weakness: Secondary | ICD-10-CM | POA: Diagnosis not present

## 2021-08-13 DIAGNOSIS — J9690 Respiratory failure, unspecified, unspecified whether with hypoxia or hypercapnia: Secondary | ICD-10-CM | POA: Diagnosis not present

## 2021-08-13 DIAGNOSIS — J449 Chronic obstructive pulmonary disease, unspecified: Secondary | ICD-10-CM | POA: Diagnosis not present

## 2021-08-13 DIAGNOSIS — J9600 Acute respiratory failure, unspecified whether with hypoxia or hypercapnia: Secondary | ICD-10-CM | POA: Diagnosis not present

## 2021-08-15 ENCOUNTER — Telehealth: Payer: Self-pay | Admitting: *Deleted

## 2021-08-17 ENCOUNTER — Telehealth: Payer: Self-pay | Admitting: Family Medicine

## 2021-08-17 NOTE — Telephone Encounter (Signed)
Patient has passed

## 2021-08-22 ENCOUNTER — Encounter (HOSPITAL_COMMUNITY): Payer: Medicare Other

## 2021-08-25 NOTE — Telephone Encounter (Signed)
TC w/ Estill Bamberg from Hospice Pt passed away today

## 2021-08-25 DEATH — deceased

## 2021-08-29 ENCOUNTER — Encounter (HOSPITAL_COMMUNITY): Payer: Medicare Other | Admitting: Internal Medicine

## 2023-05-16 IMAGING — DX DG CHEST 1V PORT
1 series · 1 of 1 positions shown · non-contrast
Comparison: Radiograph 04/01/2021

CLINICAL DATA: Shortness of breath, confusion, tachycardia

EXAM:
PORTABLE CHEST 1 VIEW

[chest ap]
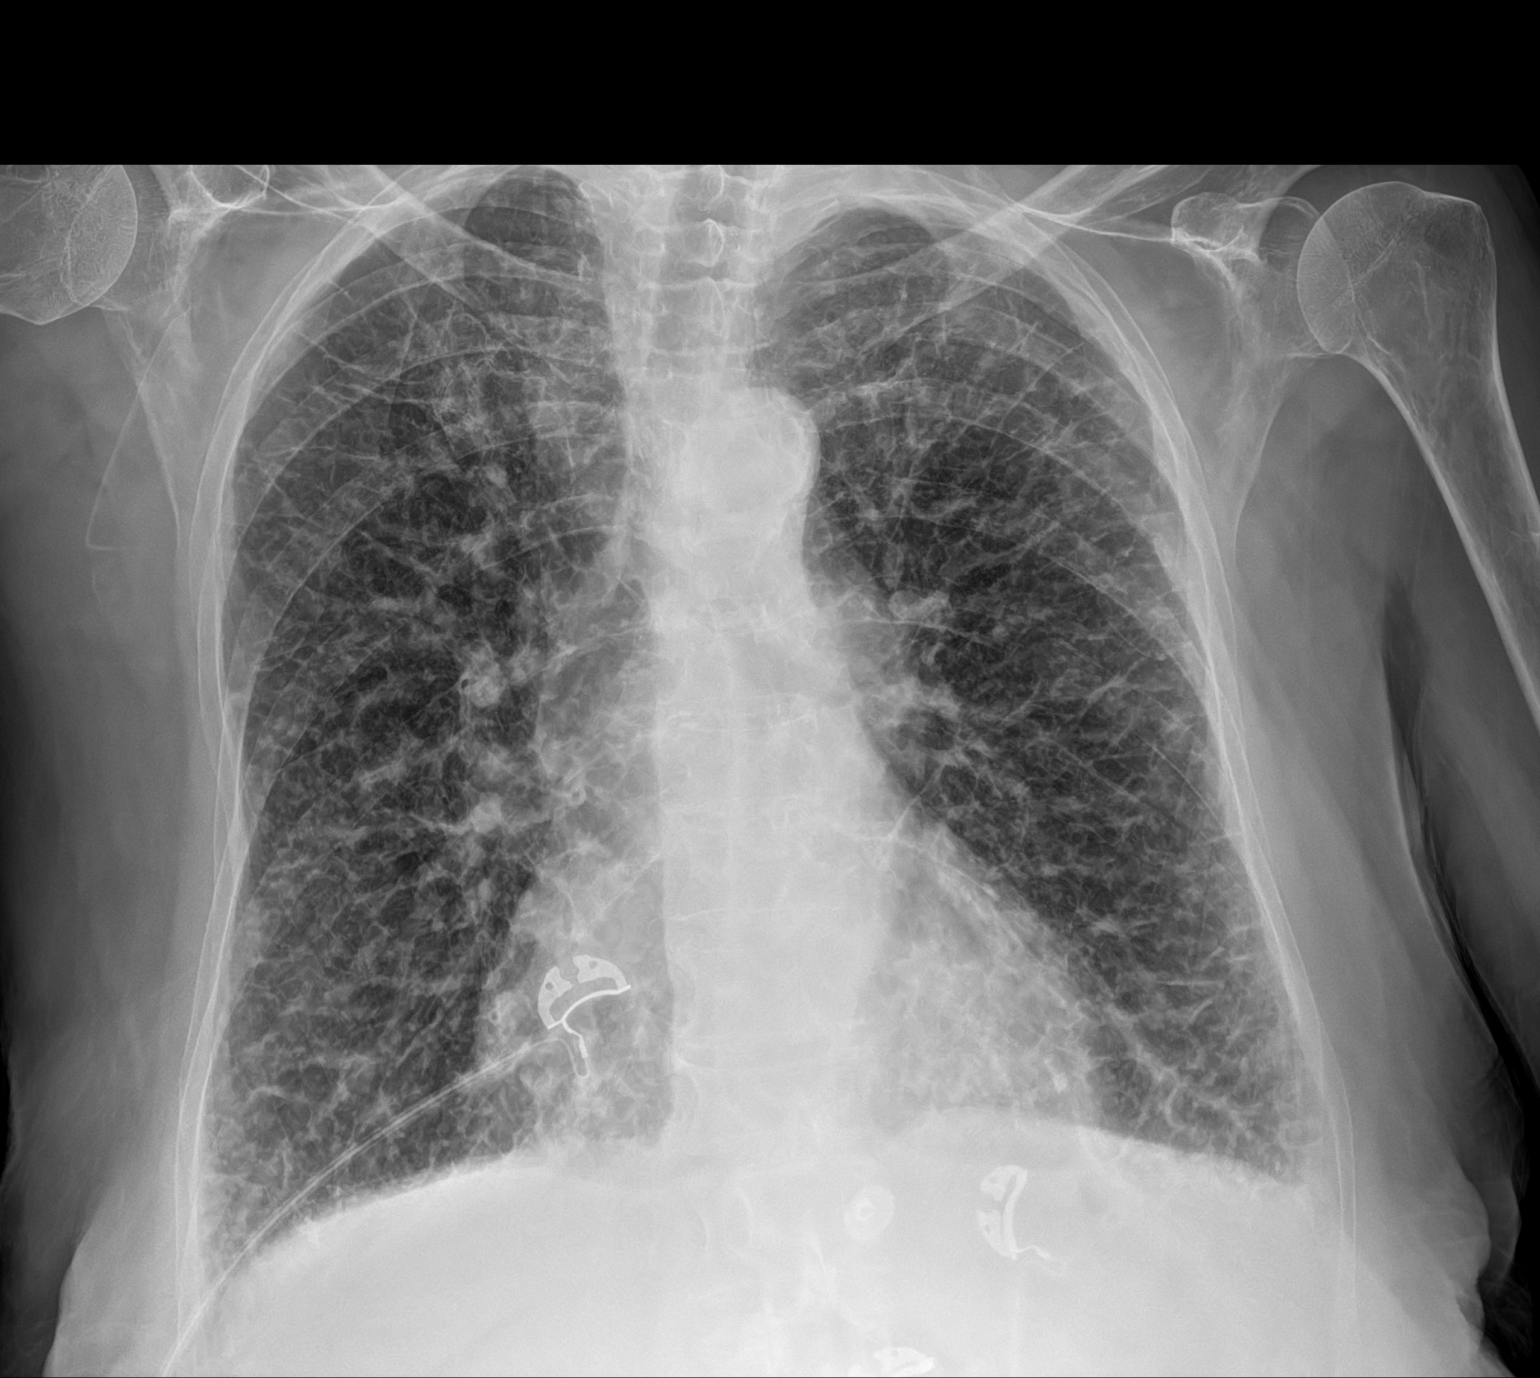

[1 of 1 positions shown; findings below may reference images not displayed]

FINDINGS: Markedly increased coarse reticulonodular opacities are present
throughout both lungs with indistinct pulmonary vascularity. No
pneumothorax or visible effusion. No focal consolidative process is
seen. Stable cardiomediastinal contours with a calcified aorta. No
acute osseous or soft tissue abnormality.
IMPRESSION: Interval development of diffuse heterogeneous reticular opacities
throughout the lungs with peripheral septal thickening. Rapid
development favoring pulmonary edema versus atypical
infection/interstitial inflammation.

Aortic Atherosclerosis ([id number].G).

## 2023-06-11 IMAGING — DX DG CHEST 2V
2 series · 2 of 2 positions shown · non-contrast
Comparison: 04/07/2021

CLINICAL DATA: COPD exacerbation.  Follow-up.

EXAM:
CHEST - 2 VIEW

[chest pa]
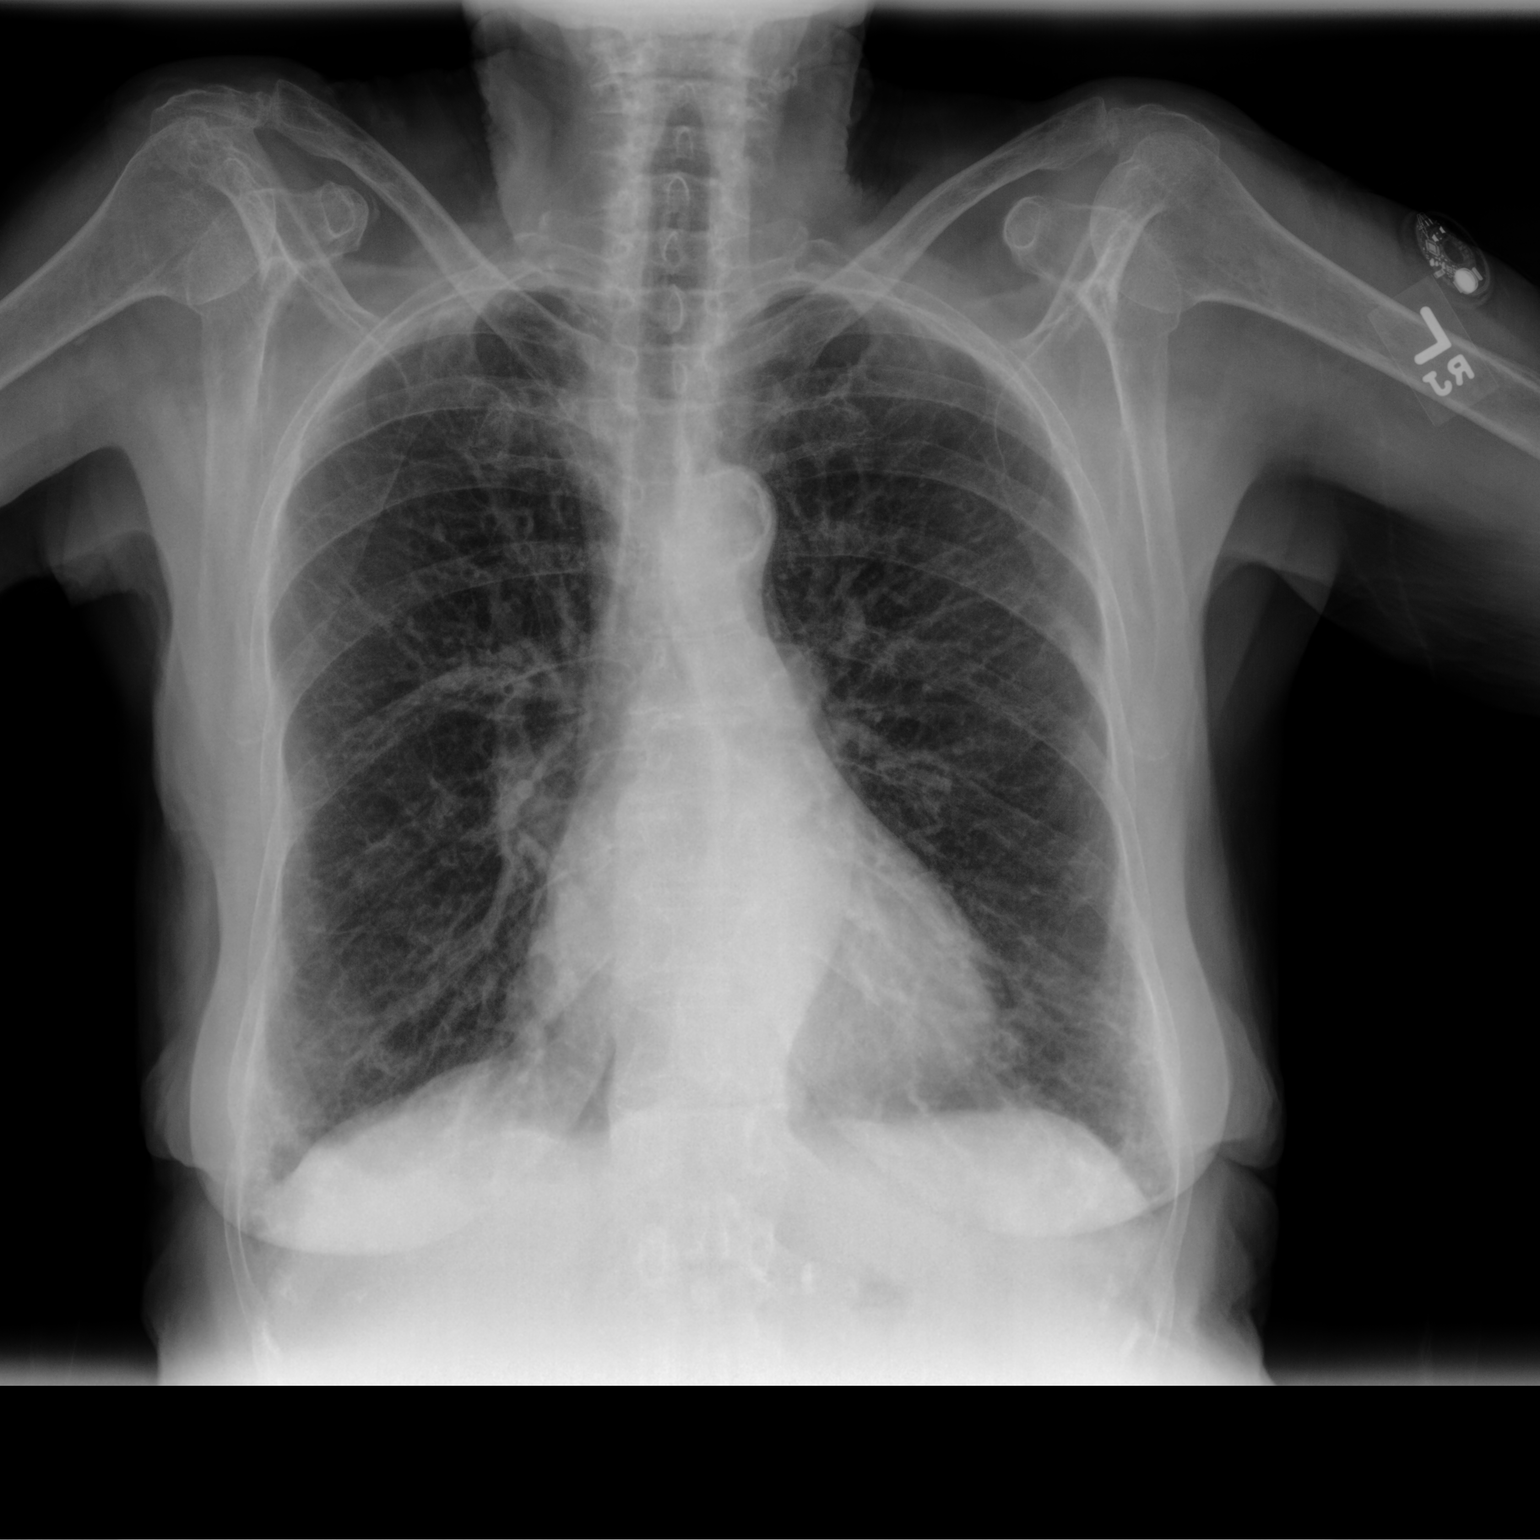

[chest lat]
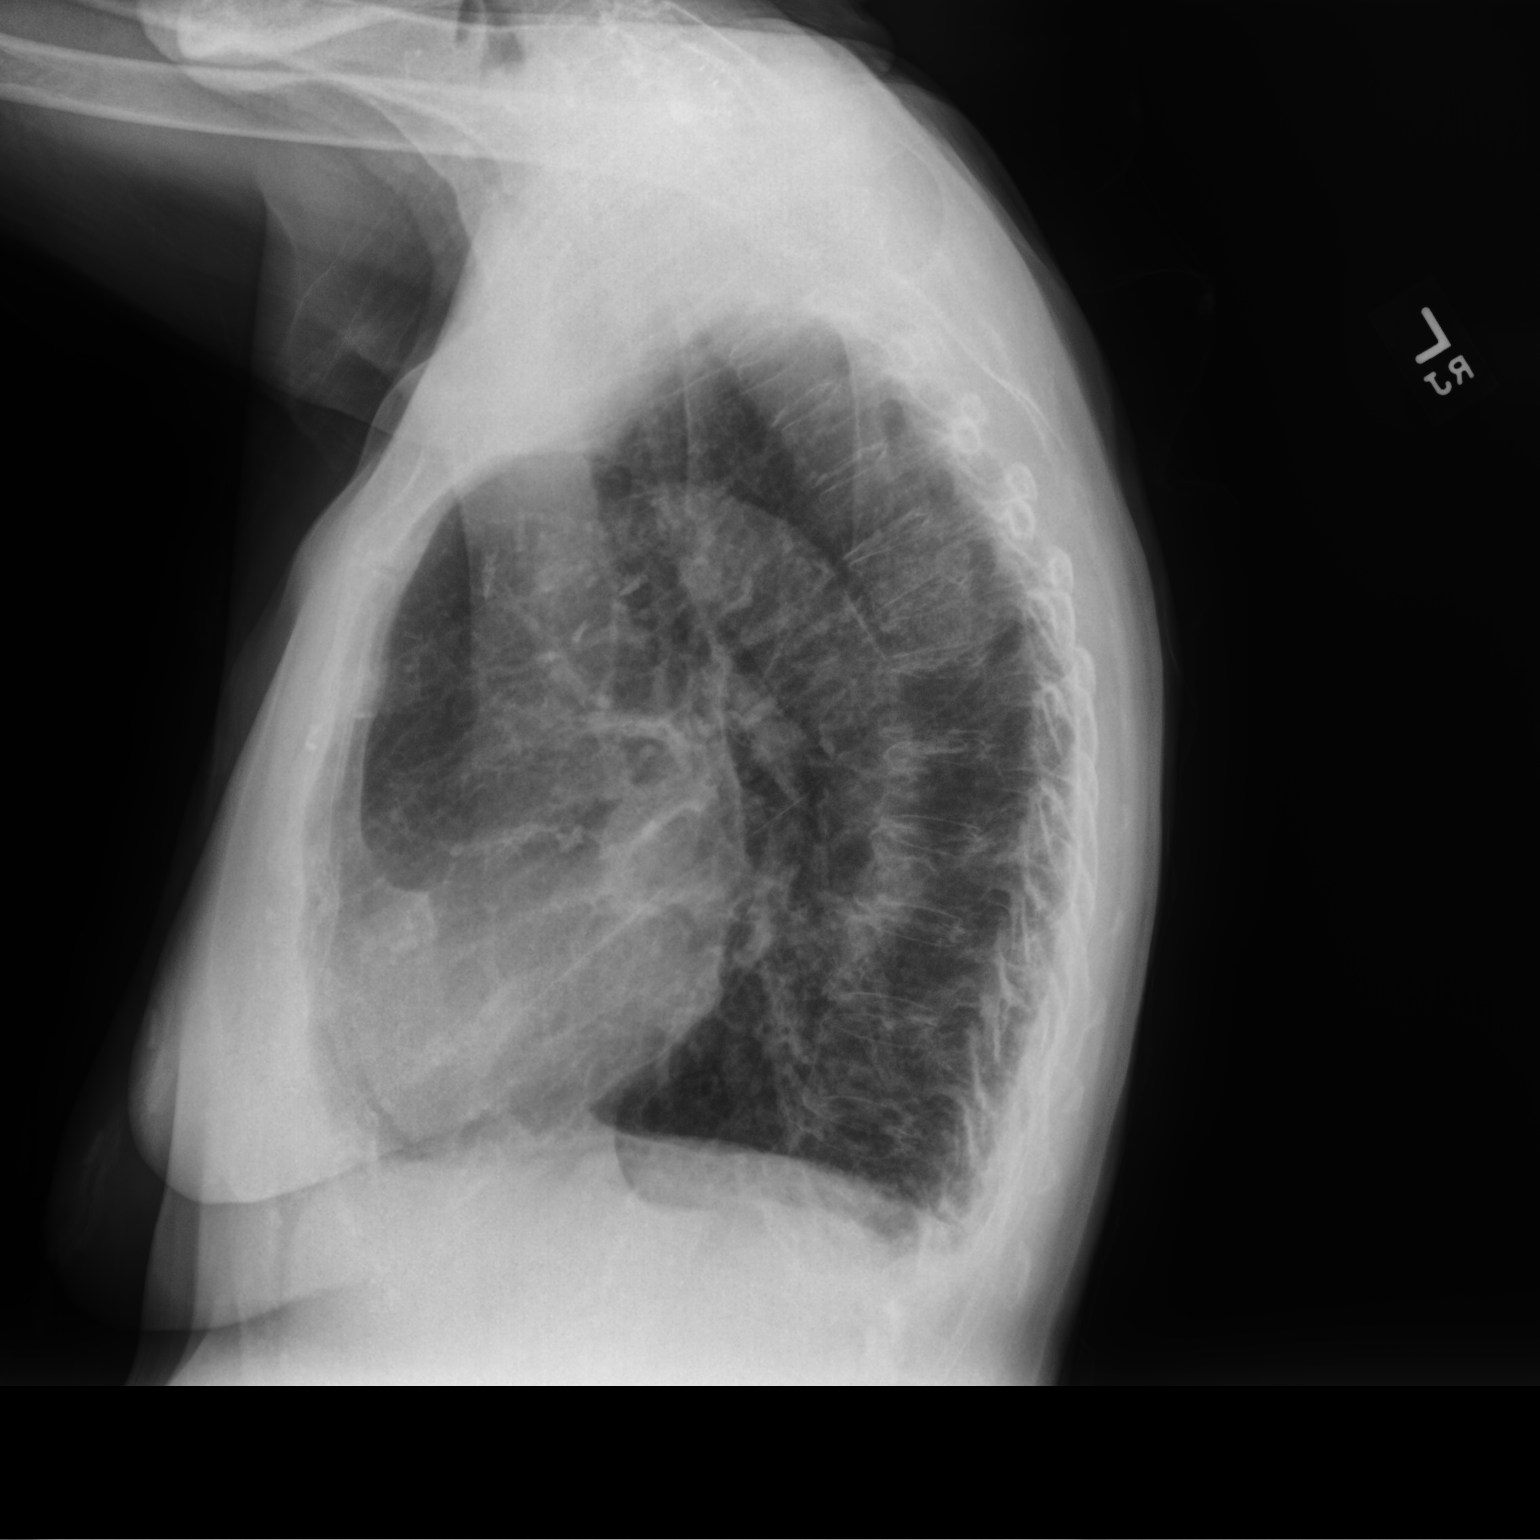

[2 of 2 positions shown; findings below may reference images not displayed]

FINDINGS: The heart size and mediastinal contours are within normal limits.
Aortic atherosclerosis. Lungs are hyperinflated with coarsened
interstitial markings and diffuse bronchial wall thickening. No
pleural effusion or edema. Chronic appearing compression deformity
within the upper thoracic spine is unchanged.
IMPRESSION: No acute cardiopulmonary abnormalities.

Signs of COPD/emphysema.
